# Patient Record
Sex: Male | Born: 1945 | Race: White | Hispanic: No | Smoking: Former smoker
Health system: Southern US, Community
[De-identification: ages and names within clinical notes are randomized; demographics above are authoritative.]

## PROBLEM LIST (undated history)

## (undated) DIAGNOSIS — E119 Type 2 diabetes mellitus without complications: Secondary | ICD-10-CM

## (undated) DIAGNOSIS — M19019 Primary osteoarthritis, unspecified shoulder: Secondary | ICD-10-CM

## (undated) DIAGNOSIS — K219 Gastro-esophageal reflux disease without esophagitis: Secondary | ICD-10-CM

## (undated) DIAGNOSIS — F32A Depression, unspecified: Secondary | ICD-10-CM

## (undated) DIAGNOSIS — E785 Hyperlipidemia, unspecified: Secondary | ICD-10-CM

## (undated) DIAGNOSIS — E559 Vitamin D deficiency, unspecified: Secondary | ICD-10-CM

## (undated) DIAGNOSIS — I10 Essential (primary) hypertension: Secondary | ICD-10-CM

## (undated) DIAGNOSIS — R251 Tremor, unspecified: Secondary | ICD-10-CM

## (undated) DIAGNOSIS — Z95828 Presence of other vascular implants and grafts: Secondary | ICD-10-CM

## (undated) DIAGNOSIS — G43909 Migraine, unspecified, not intractable, without status migrainosus: Secondary | ICD-10-CM

## (undated) DIAGNOSIS — I2699 Other pulmonary embolism without acute cor pulmonale: Secondary | ICD-10-CM

## (undated) DIAGNOSIS — F329 Major depressive disorder, single episode, unspecified: Secondary | ICD-10-CM

## (undated) DIAGNOSIS — F419 Anxiety disorder, unspecified: Secondary | ICD-10-CM

## (undated) DIAGNOSIS — C801 Malignant (primary) neoplasm, unspecified: Secondary | ICD-10-CM

## (undated) DIAGNOSIS — K259 Gastric ulcer, unspecified as acute or chronic, without hemorrhage or perforation: Secondary | ICD-10-CM

## (undated) DIAGNOSIS — C8590 Non-Hodgkin lymphoma, unspecified, unspecified site: Secondary | ICD-10-CM

## (undated) HISTORY — DX: Primary osteoarthritis, unspecified shoulder: M19.019

## (undated) HISTORY — DX: Other pulmonary embolism without acute cor pulmonale: I26.99

## (undated) HISTORY — DX: Gastric ulcer, unspecified as acute or chronic, without hemorrhage or perforation: K25.9

## (undated) HISTORY — DX: Depression, unspecified: F32.A

## (undated) HISTORY — DX: Gastro-esophageal reflux disease without esophagitis: K21.9

## (undated) HISTORY — DX: Non-Hodgkin lymphoma, unspecified, unspecified site: C85.90

## (undated) HISTORY — PX: KNEE SURGERY: SHX244

## (undated) HISTORY — DX: Migraine, unspecified, not intractable, without status migrainosus: G43.909

## (undated) HISTORY — DX: Type 2 diabetes mellitus without complications: E11.9

## (undated) HISTORY — DX: Major depressive disorder, single episode, unspecified: F32.9

## (undated) HISTORY — DX: Anxiety disorder, unspecified: F41.9

## (undated) HISTORY — DX: Tremor, unspecified: R25.1

## (undated) HISTORY — DX: Vitamin D deficiency, unspecified: E55.9

## (undated) HISTORY — DX: Hyperlipidemia, unspecified: E78.5

## (undated) HISTORY — PX: SHOULDER SURGERY: SHX246

## (undated) HISTORY — PX: BACK SURGERY: SHX140

---
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
## 2003-11-27 ENCOUNTER — Encounter: Admission: RE | Admit: 2003-11-27 | Discharge: 2003-11-27 | Payer: Self-pay

## 2004-02-17 ENCOUNTER — Ambulatory Visit (HOSPITAL_COMMUNITY): Admission: RE | Admit: 2004-02-17 | Discharge: 2004-02-17 | Payer: Self-pay | Admitting: Neurology

## 2004-02-22 ENCOUNTER — Encounter: Admission: RE | Admit: 2004-02-22 | Discharge: 2004-05-22 | Payer: Self-pay | Admitting: Neurology

## 2005-01-04 ENCOUNTER — Emergency Department (HOSPITAL_COMMUNITY): Admission: EM | Admit: 2005-01-04 | Discharge: 2005-01-04 | Payer: Self-pay | Admitting: *Deleted

## 2005-01-06 ENCOUNTER — Emergency Department (HOSPITAL_COMMUNITY): Admission: EM | Admit: 2005-01-06 | Discharge: 2005-01-06 | Payer: Self-pay | Admitting: Emergency Medicine

## 2005-01-16 ENCOUNTER — Emergency Department (HOSPITAL_COMMUNITY): Admission: EM | Admit: 2005-01-16 | Discharge: 2005-01-16 | Payer: Self-pay | Admitting: Emergency Medicine

## 2005-01-18 ENCOUNTER — Emergency Department (HOSPITAL_COMMUNITY): Admission: EM | Admit: 2005-01-18 | Discharge: 2005-01-18 | Payer: Self-pay | Admitting: Emergency Medicine

## 2005-02-14 ENCOUNTER — Emergency Department (HOSPITAL_COMMUNITY): Admission: EM | Admit: 2005-02-14 | Discharge: 2005-02-14 | Payer: Self-pay | Admitting: Emergency Medicine

## 2005-02-16 ENCOUNTER — Emergency Department (HOSPITAL_COMMUNITY): Admission: EM | Admit: 2005-02-16 | Discharge: 2005-02-16 | Payer: Self-pay | Admitting: Emergency Medicine

## 2005-02-18 ENCOUNTER — Emergency Department (HOSPITAL_COMMUNITY): Admission: EM | Admit: 2005-02-18 | Discharge: 2005-02-18 | Payer: Self-pay | Admitting: Emergency Medicine

## 2006-04-23 ENCOUNTER — Observation Stay (HOSPITAL_COMMUNITY): Admission: RE | Admit: 2006-04-23 | Discharge: 2006-04-24 | Payer: Self-pay | Admitting: Neurosurgery

## 2007-02-22 ENCOUNTER — Inpatient Hospital Stay (HOSPITAL_COMMUNITY): Admission: EM | Admit: 2007-02-22 | Discharge: 2007-02-26 | Payer: Self-pay | Admitting: Emergency Medicine

## 2007-02-27 ENCOUNTER — Ambulatory Visit (HOSPITAL_COMMUNITY): Payer: Self-pay | Admitting: Family Medicine

## 2007-02-27 ENCOUNTER — Encounter (HOSPITAL_COMMUNITY): Admission: RE | Admit: 2007-02-27 | Discharge: 2007-03-29 | Payer: Self-pay | Admitting: Family Medicine

## 2007-02-27 ENCOUNTER — Encounter (HOSPITAL_COMMUNITY): Admission: RE | Admit: 2007-02-27 | Discharge: 2007-03-29 | Payer: Self-pay

## 2009-02-23 ENCOUNTER — Emergency Department (HOSPITAL_COMMUNITY): Admission: EM | Admit: 2009-02-23 | Discharge: 2009-02-23 | Payer: Self-pay | Admitting: Emergency Medicine

## 2009-09-20 ENCOUNTER — Ambulatory Visit: Payer: Self-pay | Admitting: Cardiology

## 2009-09-20 ENCOUNTER — Observation Stay (HOSPITAL_COMMUNITY): Admission: EM | Admit: 2009-09-20 | Discharge: 2009-09-23 | Payer: Self-pay | Admitting: Emergency Medicine

## 2009-09-21 ENCOUNTER — Encounter (INDEPENDENT_AMBULATORY_CARE_PROVIDER_SITE_OTHER): Payer: Self-pay | Admitting: Internal Medicine

## 2010-06-25 ENCOUNTER — Encounter: Admission: RE | Admit: 2010-06-25 | Discharge: 2010-06-25 | Payer: Self-pay | Admitting: Neurosurgery

## 2010-10-10 ENCOUNTER — Inpatient Hospital Stay (HOSPITAL_COMMUNITY)
Admission: EM | Admit: 2010-10-10 | Discharge: 2010-10-16 | Payer: Self-pay | Source: Home / Self Care | Attending: Internal Medicine | Admitting: Internal Medicine

## 2010-11-26 ENCOUNTER — Encounter: Payer: Self-pay | Admitting: Family Medicine

## 2011-01-16 LAB — GLUCOSE, CAPILLARY
Glucose-Capillary: 102 mg/dL — ABNORMAL HIGH (ref 70–99)
Glucose-Capillary: 103 mg/dL — ABNORMAL HIGH (ref 70–99)
Glucose-Capillary: 110 mg/dL — ABNORMAL HIGH (ref 70–99)
Glucose-Capillary: 113 mg/dL — ABNORMAL HIGH (ref 70–99)
Glucose-Capillary: 114 mg/dL — ABNORMAL HIGH (ref 70–99)
Glucose-Capillary: 115 mg/dL — ABNORMAL HIGH (ref 70–99)
Glucose-Capillary: 115 mg/dL — ABNORMAL HIGH (ref 70–99)
Glucose-Capillary: 115 mg/dL — ABNORMAL HIGH (ref 70–99)
Glucose-Capillary: 115 mg/dL — ABNORMAL HIGH (ref 70–99)
Glucose-Capillary: 119 mg/dL — ABNORMAL HIGH (ref 70–99)
Glucose-Capillary: 121 mg/dL — ABNORMAL HIGH (ref 70–99)
Glucose-Capillary: 121 mg/dL — ABNORMAL HIGH (ref 70–99)
Glucose-Capillary: 122 mg/dL — ABNORMAL HIGH (ref 70–99)
Glucose-Capillary: 138 mg/dL — ABNORMAL HIGH (ref 70–99)
Glucose-Capillary: 139 mg/dL — ABNORMAL HIGH (ref 70–99)
Glucose-Capillary: 149 mg/dL — ABNORMAL HIGH (ref 70–99)
Glucose-Capillary: 159 mg/dL — ABNORMAL HIGH (ref 70–99)
Glucose-Capillary: 161 mg/dL — ABNORMAL HIGH (ref 70–99)
Glucose-Capillary: 89 mg/dL (ref 70–99)
Glucose-Capillary: 92 mg/dL (ref 70–99)
Glucose-Capillary: 93 mg/dL (ref 70–99)
Glucose-Capillary: 96 mg/dL (ref 70–99)
Glucose-Capillary: 98 mg/dL (ref 70–99)

## 2011-01-16 LAB — CBC
HCT: 38.3 % — ABNORMAL LOW (ref 39.0–52.0)
HCT: 39.7 % (ref 39.0–52.0)
HCT: 39.8 % (ref 39.0–52.0)
HCT: 39.9 % (ref 39.0–52.0)
HCT: 40 % (ref 39.0–52.0)
HCT: 41.6 % (ref 39.0–52.0)
Hemoglobin: 12.8 g/dL — ABNORMAL LOW (ref 13.0–17.0)
Hemoglobin: 12.9 g/dL — ABNORMAL LOW (ref 13.0–17.0)
Hemoglobin: 13.1 g/dL (ref 13.0–17.0)
Hemoglobin: 13.2 g/dL (ref 13.0–17.0)
Hemoglobin: 13.4 g/dL (ref 13.0–17.0)
Hemoglobin: 13.8 g/dL (ref 13.0–17.0)
MCH: 30.9 pg (ref 26.0–34.0)
MCH: 31 pg (ref 26.0–34.0)
MCH: 31.2 pg (ref 26.0–34.0)
MCH: 31.4 pg (ref 26.0–34.0)
MCH: 31.7 pg (ref 26.0–34.0)
MCH: 31.8 pg (ref 26.0–34.0)
MCHC: 32.4 g/dL (ref 30.0–36.0)
MCHC: 33 g/dL (ref 30.0–36.0)
MCHC: 33.1 g/dL (ref 30.0–36.0)
MCHC: 33.2 g/dL (ref 30.0–36.0)
MCHC: 33.4 g/dL (ref 30.0–36.0)
MCHC: 33.5 g/dL (ref 30.0–36.0)
MCV: 93.7 fL (ref 78.0–100.0)
MCV: 93.9 fL (ref 78.0–100.0)
MCV: 93.9 fL (ref 78.0–100.0)
MCV: 95 fL (ref 78.0–100.0)
MCV: 95.4 fL (ref 78.0–100.0)
MCV: 95.7 fL (ref 78.0–100.0)
Platelets: 239 10*3/uL (ref 150–400)
Platelets: 252 10*3/uL (ref 150–400)
Platelets: 254 10*3/uL (ref 150–400)
Platelets: 267 10*3/uL (ref 150–400)
Platelets: 270 10*3/uL (ref 150–400)
Platelets: 271 10*3/uL (ref 150–400)
RBC: 4.03 MIL/uL — ABNORMAL LOW (ref 4.22–5.81)
RBC: 4.17 MIL/uL — ABNORMAL LOW (ref 4.22–5.81)
RBC: 4.17 MIL/uL — ABNORMAL LOW (ref 4.22–5.81)
RBC: 4.23 MIL/uL (ref 4.22–5.81)
RBC: 4.27 MIL/uL (ref 4.22–5.81)
RBC: 4.43 MIL/uL (ref 4.22–5.81)
RDW: 12.1 % (ref 11.5–15.5)
RDW: 12.2 % (ref 11.5–15.5)
RDW: 12.2 % (ref 11.5–15.5)
RDW: 12.2 % (ref 11.5–15.5)
RDW: 12.3 % (ref 11.5–15.5)
RDW: 12.3 % (ref 11.5–15.5)
WBC: 5.8 10*3/uL (ref 4.0–10.5)
WBC: 6.7 10*3/uL (ref 4.0–10.5)
WBC: 6.9 10*3/uL (ref 4.0–10.5)
WBC: 7.8 10*3/uL (ref 4.0–10.5)
WBC: 8.5 10*3/uL (ref 4.0–10.5)
WBC: 9.3 10*3/uL (ref 4.0–10.5)

## 2011-01-16 LAB — URINALYSIS, ROUTINE W REFLEX MICROSCOPIC
Bilirubin Urine: NEGATIVE
Glucose, UA: NEGATIVE mg/dL
Hgb urine dipstick: NEGATIVE
Ketones, ur: NEGATIVE mg/dL
Nitrite: NEGATIVE
Protein, ur: NEGATIVE mg/dL
Specific Gravity, Urine: 1.009 (ref 1.005–1.030)
Urobilinogen, UA: 0.2 mg/dL (ref 0.0–1.0)
pH: 7 (ref 5.0–8.0)

## 2011-01-16 LAB — URINE CULTURE
Colony Count: NO GROWTH
Colony Count: NO GROWTH
Culture  Setup Time: 201112071248
Culture  Setup Time: 201112102039
Culture: NO GROWTH
Culture: NO GROWTH

## 2011-01-16 LAB — HEMOGLOBIN A1C
Hgb A1c MFr Bld: 6 % — ABNORMAL HIGH (ref <5.7)
Mean Plasma Glucose: 126 mg/dL — ABNORMAL HIGH (ref <117)

## 2011-01-16 LAB — BASIC METABOLIC PANEL
BUN: 10 mg/dL (ref 6–23)
BUN: 11 mg/dL (ref 6–23)
BUN: 5 mg/dL — ABNORMAL LOW (ref 6–23)
BUN: 6 mg/dL (ref 6–23)
BUN: 7 mg/dL (ref 6–23)
BUN: 7 mg/dL (ref 6–23)
CO2: 24 mEq/L (ref 19–32)
CO2: 24 mEq/L (ref 19–32)
CO2: 26 mEq/L (ref 19–32)
CO2: 26 mEq/L (ref 19–32)
CO2: 27 mEq/L (ref 19–32)
CO2: 27 mEq/L (ref 19–32)
Calcium: 8.8 mg/dL (ref 8.4–10.5)
Calcium: 8.8 mg/dL (ref 8.4–10.5)
Calcium: 8.9 mg/dL (ref 8.4–10.5)
Calcium: 8.9 mg/dL (ref 8.4–10.5)
Calcium: 8.9 mg/dL (ref 8.4–10.5)
Calcium: 8.9 mg/dL (ref 8.4–10.5)
Chloride: 106 mEq/L (ref 96–112)
Chloride: 107 mEq/L (ref 96–112)
Chloride: 107 mEq/L (ref 96–112)
Chloride: 107 mEq/L (ref 96–112)
Chloride: 108 mEq/L (ref 96–112)
Chloride: 108 mEq/L (ref 96–112)
Creatinine, Ser: 0.79 mg/dL (ref 0.4–1.5)
Creatinine, Ser: 0.79 mg/dL (ref 0.4–1.5)
Creatinine, Ser: 0.79 mg/dL (ref 0.4–1.5)
Creatinine, Ser: 0.93 mg/dL (ref 0.4–1.5)
Creatinine, Ser: 0.96 mg/dL (ref 0.4–1.5)
Creatinine, Ser: 1 mg/dL (ref 0.4–1.5)
GFR calc Af Amer: >60 mL/min (ref >60)
GFR calc Af Amer: >60 mL/min (ref >60)
GFR calc Af Amer: >60 mL/min (ref >60)
GFR calc Af Amer: >60 mL/min (ref >60)
GFR calc Af Amer: >60 mL/min (ref >60)
GFR calc Af Amer: >60 mL/min (ref >60)
GFR calc non Af Amer: >60 mL/min (ref >60)
GFR calc non Af Amer: >60 mL/min (ref >60)
GFR calc non Af Amer: >60 mL/min (ref >60)
GFR calc non Af Amer: >60 mL/min (ref >60)
GFR calc non Af Amer: >60 mL/min (ref >60)
GFR calc non Af Amer: >60 mL/min (ref >60)
Glucose, Bld: 108 mg/dL — ABNORMAL HIGH (ref 70–99)
Glucose, Bld: 110 mg/dL — ABNORMAL HIGH (ref 70–99)
Glucose, Bld: 112 mg/dL — ABNORMAL HIGH (ref 70–99)
Glucose, Bld: 133 mg/dL — ABNORMAL HIGH (ref 70–99)
Glucose, Bld: 90 mg/dL (ref 70–99)
Glucose, Bld: 96 mg/dL (ref 70–99)
Potassium: 3.4 mEq/L — ABNORMAL LOW (ref 3.5–5.1)
Potassium: 3.7 mEq/L (ref 3.5–5.1)
Potassium: 3.7 mEq/L (ref 3.5–5.1)
Potassium: 3.8 mEq/L (ref 3.5–5.1)
Potassium: 3.8 mEq/L (ref 3.5–5.1)
Potassium: 3.9 mEq/L (ref 3.5–5.1)
Sodium: 139 mEq/L (ref 135–145)
Sodium: 140 mEq/L (ref 135–145)
Sodium: 140 mEq/L (ref 135–145)
Sodium: 141 mEq/L (ref 135–145)
Sodium: 141 mEq/L (ref 135–145)
Sodium: 142 mEq/L (ref 135–145)

## 2011-01-16 LAB — URINALYSIS, MICROSCOPIC ONLY
Bilirubin Urine: NEGATIVE
Glucose, UA: NEGATIVE mg/dL
Hgb urine dipstick: NEGATIVE
Ketones, ur: NEGATIVE mg/dL
Leukocytes, UA: NEGATIVE
Nitrite: NEGATIVE
Protein, ur: NEGATIVE mg/dL
Specific Gravity, Urine: 1.021 (ref 1.005–1.030)
Urobilinogen, UA: 0.2 mg/dL (ref 0.0–1.0)
pH: 6.5 (ref 5.0–8.0)

## 2011-01-16 LAB — HEMOCCULT GUIAC POC 1CARD (OFFICE): Fecal Occult Bld: POSITIVE

## 2011-01-16 LAB — DIFFERENTIAL
Basophils Absolute: 0.1 10*3/uL (ref 0.0–0.1)
Basophils Relative: 1 % (ref 0–1)
Eosinophils Absolute: 0.2 10*3/uL (ref 0.0–0.7)
Eosinophils Relative: 3 % (ref 0–5)
Lymphocytes Relative: 41 % (ref 12–46)
Lymphs Abs: 2.7 10*3/uL (ref 0.7–4.0)
Monocytes Absolute: 0.8 10*3/uL (ref 0.1–1.0)
Monocytes Relative: 11 % (ref 3–12)
Neutro Abs: 3 10*3/uL (ref 1.7–7.7)
Neutrophils Relative %: 45 % (ref 43–77)

## 2011-01-16 LAB — URINE MICROSCOPIC-ADD ON

## 2011-01-16 LAB — TSH: TSH: 3.137 u[IU]/mL (ref 0.350–4.500)

## 2011-01-16 LAB — PROTIME-INR
INR: 1.07 (ref 0.00–1.49)
Prothrombin Time: 14.1 seconds (ref 11.6–15.2)

## 2011-01-16 LAB — COMPREHENSIVE METABOLIC PANEL
ALT: 16 U/L (ref 0–53)
AST: 15 U/L (ref 0–37)
Albumin: 3.6 g/dL (ref 3.5–5.2)
Alkaline Phosphatase: 78 U/L (ref 39–117)
BUN: 7 mg/dL (ref 6–23)
CO2: 27 mEq/L (ref 19–32)
Calcium: 8.5 mg/dL (ref 8.4–10.5)
Chloride: 103 mEq/L (ref 96–112)
Creatinine, Ser: 0.79 mg/dL (ref 0.4–1.5)
GFR calc Af Amer: >60 mL/min (ref >60)
GFR calc non Af Amer: >60 mL/min (ref >60)
Glucose, Bld: 75 mg/dL (ref 70–99)
Potassium: 4 mEq/L (ref 3.5–5.1)
Sodium: 137 mEq/L (ref 135–145)
Total Bilirubin: 0.2 mg/dL — ABNORMAL LOW (ref 0.3–1.2)
Total Protein: 6.5 g/dL (ref 6.0–8.3)

## 2011-01-16 LAB — LACTIC ACID, PLASMA: Lactic Acid, Venous: 0.8 mmol/L (ref 0.5–2.2)

## 2011-01-16 LAB — MRSA PCR SCREENING: MRSA by PCR: NEGATIVE

## 2011-02-08 LAB — HEPATIC FUNCTION PANEL
ALT: 21 U/L (ref 0–53)
AST: 19 U/L (ref 0–37)
Albumin: 3.6 g/dL (ref 3.5–5.2)
Alkaline Phosphatase: 73 U/L (ref 39–117)
Bilirubin, Direct: <0.1 mg/dL (ref 0.0–0.3)
Total Bilirubin: 0.3 mg/dL (ref 0.3–1.2)
Total Protein: 6.7 g/dL (ref 6.0–8.3)

## 2011-02-08 LAB — BASIC METABOLIC PANEL
BUN: 9 mg/dL (ref 6–23)
BUN: 12 mg/dL (ref 6–23)
CO2: 27 mEq/L (ref 19–32)
CO2: 27 mEq/L (ref 19–32)
Calcium: 8.9 mg/dL (ref 8.4–10.5)
Calcium: 9.1 mg/dL (ref 8.4–10.5)
Chloride: 106 mEq/L (ref 96–112)
Chloride: 105 mEq/L (ref 96–112)
Creatinine, Ser: 0.95 mg/dL (ref 0.4–1.5)
Creatinine, Ser: 0.95 mg/dL (ref 0.4–1.5)
GFR calc Af Amer: >60 mL/min (ref >60)
GFR calc Af Amer: >60 mL/min (ref >60)
GFR calc non Af Amer: >60 mL/min (ref >60)
GFR calc non Af Amer: >60 mL/min (ref >60)
Glucose, Bld: 109 mg/dL — ABNORMAL HIGH (ref 70–99)
Glucose, Bld: 63 mg/dL — ABNORMAL LOW (ref 70–99)
Potassium: 4.4 mEq/L (ref 3.5–5.1)
Potassium: 3.6 mEq/L (ref 3.5–5.1)
Sodium: 139 mEq/L (ref 135–145)
Sodium: 140 mEq/L (ref 135–145)

## 2011-02-08 LAB — DIFFERENTIAL
Basophils Absolute: 0 10*3/uL (ref 0.0–0.1)
Basophils Absolute: 0 10*3/uL (ref 0.0–0.1)
Basophils Absolute: 0 10*3/uL (ref 0.0–0.1)
Basophils Relative: 1 % (ref 0–1)
Basophils Relative: 1 % (ref 0–1)
Basophils Relative: 1 % (ref 0–1)
Eosinophils Absolute: 0.1 10*3/uL (ref 0.0–0.7)
Eosinophils Absolute: 0.2 10*3/uL (ref 0.0–0.7)
Eosinophils Absolute: 0.1 10*3/uL (ref 0.0–0.7)
Eosinophils Relative: 1 % (ref 0–5)
Eosinophils Relative: 4 % (ref 0–5)
Eosinophils Relative: 2 % (ref 0–5)
Lymphocytes Relative: 32 % (ref 12–46)
Lymphocytes Relative: 49 % — ABNORMAL HIGH (ref 12–46)
Lymphocytes Relative: 40 % (ref 12–46)
Lymphs Abs: 2.9 10*3/uL (ref 0.7–4.0)
Lymphs Abs: 3.4 10*3/uL (ref 0.7–4.0)
Lymphs Abs: 3.3 10*3/uL (ref 0.7–4.0)
Monocytes Absolute: 0.6 10*3/uL (ref 0.1–1.0)
Monocytes Absolute: 0.8 10*3/uL (ref 0.1–1.0)
Monocytes Absolute: 0.8 10*3/uL (ref 0.1–1.0)
Monocytes Relative: 9 % (ref 3–12)
Monocytes Relative: 9 % (ref 3–12)
Monocytes Relative: 10 % (ref 3–12)
Neutro Abs: 2.7 10*3/uL (ref 1.7–7.7)
Neutro Abs: 5.2 10*3/uL (ref 1.7–7.7)
Neutro Abs: 4 10*3/uL (ref 1.7–7.7)
Neutrophils Relative %: 39 % — ABNORMAL LOW (ref 43–77)
Neutrophils Relative %: 58 % (ref 43–77)
Neutrophils Relative %: 48 % (ref 43–77)

## 2011-02-08 LAB — GLUCOSE, CAPILLARY
Glucose-Capillary: 102 mg/dL — ABNORMAL HIGH (ref 70–99)
Glucose-Capillary: 105 mg/dL — ABNORMAL HIGH (ref 70–99)
Glucose-Capillary: 114 mg/dL — ABNORMAL HIGH (ref 70–99)
Glucose-Capillary: 130 mg/dL — ABNORMAL HIGH (ref 70–99)
Glucose-Capillary: 134 mg/dL — ABNORMAL HIGH (ref 70–99)
Glucose-Capillary: 139 mg/dL — ABNORMAL HIGH (ref 70–99)
Glucose-Capillary: 150 mg/dL — ABNORMAL HIGH (ref 70–99)
Glucose-Capillary: 154 mg/dL — ABNORMAL HIGH (ref 70–99)
Glucose-Capillary: 96 mg/dL (ref 70–99)
Glucose-Capillary: 161 mg/dL — ABNORMAL HIGH (ref 70–99)
Glucose-Capillary: 171 mg/dL — ABNORMAL HIGH (ref 70–99)

## 2011-02-08 LAB — COMPREHENSIVE METABOLIC PANEL
ALT: 18 U/L (ref 0–53)
AST: 15 U/L (ref 0–37)
Albumin: 3.2 g/dL — ABNORMAL LOW (ref 3.5–5.2)
Alkaline Phosphatase: 62 U/L (ref 39–117)
BUN: 11 mg/dL (ref 6–23)
CO2: 26 mEq/L (ref 19–32)
Calcium: 8.9 mg/dL (ref 8.4–10.5)
Chloride: 108 mEq/L (ref 96–112)
Creatinine, Ser: 0.96 mg/dL (ref 0.4–1.5)
GFR calc Af Amer: >60 mL/min (ref >60)
GFR calc non Af Amer: >60 mL/min (ref >60)
Glucose, Bld: 107 mg/dL — ABNORMAL HIGH (ref 70–99)
Potassium: 3.9 mEq/L (ref 3.5–5.1)
Sodium: 138 mEq/L (ref 135–145)
Total Bilirubin: 0.4 mg/dL (ref 0.3–1.2)
Total Protein: 5.7 g/dL — ABNORMAL LOW (ref 6.0–8.3)

## 2011-02-08 LAB — CARDIAC PANEL(CRET KIN+CKTOT+MB+TROPI)
CK, MB: 0.7 ng/mL (ref 0.3–4.0)
CK, MB: 0.9 ng/mL (ref 0.3–4.0)
CK, MB: 0.7 ng/mL (ref 0.3–4.0)
Relative Index: INVALID (ref 0.0–2.5)
Relative Index: INVALID (ref 0.0–2.5)
Relative Index: INVALID (ref 0.0–2.5)
Total CK: 23 U/L (ref 7–232)
Total CK: 27 U/L (ref 7–232)
Total CK: 28 U/L (ref 7–232)
Troponin I: 0.02 ng/mL (ref 0.00–0.06)
Troponin I: 0.03 ng/mL (ref 0.00–0.06)
Troponin I: 0.02 ng/mL (ref 0.00–0.06)

## 2011-02-08 LAB — AMYLASE: Amylase: 64 U/L (ref 27–131)

## 2011-02-08 LAB — CBC
HCT: 35 % — ABNORMAL LOW (ref 39.0–52.0)
HCT: 35.9 % — ABNORMAL LOW (ref 39.0–52.0)
HCT: 39.7 % (ref 39.0–52.0)
Hemoglobin: 11.9 g/dL — ABNORMAL LOW (ref 13.0–17.0)
Hemoglobin: 12.4 g/dL — ABNORMAL LOW (ref 13.0–17.0)
Hemoglobin: 13.7 g/dL (ref 13.0–17.0)
MCHC: 34 g/dL (ref 30.0–36.0)
MCHC: 34.5 g/dL (ref 30.0–36.0)
MCHC: 34.4 g/dL (ref 30.0–36.0)
MCV: 94.7 fL (ref 78.0–100.0)
MCV: 95.3 fL (ref 78.0–100.0)
MCV: 95.1 fL (ref 78.0–100.0)
Platelets: 231 10*3/uL (ref 150–400)
Platelets: 245 10*3/uL (ref 150–400)
Platelets: 260 10*3/uL (ref 150–400)
RBC: 3.68 MIL/uL — ABNORMAL LOW (ref 4.22–5.81)
RBC: 3.79 MIL/uL — ABNORMAL LOW (ref 4.22–5.81)
RBC: 4.17 MIL/uL — ABNORMAL LOW (ref 4.22–5.81)
RDW: 12.6 % (ref 11.5–15.5)
RDW: 12.8 % (ref 11.5–15.5)
RDW: 13.4 % (ref 11.5–15.5)
WBC: 7 10*3/uL (ref 4.0–10.5)
WBC: 9.1 10*3/uL (ref 4.0–10.5)
WBC: 8.3 10*3/uL (ref 4.0–10.5)

## 2011-02-08 LAB — POCT CARDIAC MARKERS
CKMB, poc: <1 ng/mL — ABNORMAL LOW (ref 1.0–8.0)
Myoglobin, poc: 55.5 ng/mL (ref 12–200)
Troponin i, poc: <0.05 ng/mL (ref 0.00–0.09)

## 2011-02-08 LAB — HEMOGLOBIN A1C
Hgb A1c MFr Bld: 6.4 % — ABNORMAL HIGH (ref 4.6–6.1)
Mean Plasma Glucose: 137 mg/dL

## 2011-02-08 LAB — LIPASE, BLOOD: Lipase: 20 U/L (ref 11–59)

## 2011-02-08 LAB — VITAMIN B12 BINDING CAPACITY, BLOOD: Vitamin B12 Bind Capacity: 1269 pg/mL (ref 650–1340)

## 2011-02-08 LAB — ETHOSUXIMIDE LEVEL: Ethosuximide Lvl: <10 ug/mL (ref 40–100)

## 2011-02-08 LAB — FOLATE RBC: RBC Folate: 560 ng/mL (ref 180–600)

## 2011-02-08 LAB — FERRITIN: Ferritin: 75 ng/mL (ref 22–322)

## 2011-02-08 LAB — TSH: TSH: 4.434 u[IU]/mL (ref 0.350–4.500)

## 2011-02-08 LAB — T4, FREE: Free T4: 0.73 ng/dL — ABNORMAL LOW (ref 0.80–1.80)

## 2011-02-08 LAB — IRON: Iron: 92 ug/dL (ref 42–135)

## 2011-02-08 LAB — D-DIMER, QUANTITATIVE (NOT AT ARMC): D-Dimer, Quant: 0.23 ug/mL-FEU (ref 0.00–0.48)

## 2011-03-24 NOTE — Consult Note (Signed)
NAME:  Daniel Richards, Daniel Richards                 ACCOUNT NO.:  [id number]   MEDICAL RECORD NO.:  [id number]          PATIENT TYPE:  INP   LOCATION:  A340                          FACILITY:  [facility]   PHYSICIAN:  J. Darreld Mclean, M.D. DATE OF BIRTH:  02/28/1946   DATE OF CONSULTATION:  DATE OF DISCHARGE:                                 CONSULTATION   REQUESTING PHYSICIAN:  Patient consultation requested by Dr. Lovell Sheehan.   Patient is a 65 year old male who hit his right hand Tuesday, 3 days  ago, and had a small superficial cut to his right index finger and hand.  On the ensuing several days, he has developed redness, pain and  tenderness and centralized __________ around the MCP joint of the right  hand, the dorsal area of the index finger.  No other injuries.  Dr.  Lovell Sheehan saw the patient, asked that I see him.  The patient has redness  to this area and a small superficial fluctuant area.  He has got good  motion to the finger, neurovascularly is intact.  Feels no fever or  chill.  The area is localized to the metacarpophalangeal joint.  There  is no streaks over the hand.  Thumb and the long finger, ring finger,  the little finger are not involved.  Grip strengths are slightly  decreased secondary to pain.  No other areas.  The patient is generally  in good health.  No x-rays have been done.  The labs are pending.  The  patient has just been admitted.   Using a small needle, I aspirated, punctured the area of small  fluctuance, got a little bit of thin liquidy fluid out, then a culture  of this.  X-ray came to get him, since I finished this, and they are  pending.   Vancomycin has been ordered.  I recommend he continue this, elevation of  his hand, warm compresses and I will follow with you.           ______________________________  J. Darreld Mclean, M.D.     JWK/MEDQ  D:  02/22/2007  T:  02/23/2007  Job:  [id number]

## 2011-03-24 NOTE — H&P (Signed)
NAME:  Daniel Richards, Daniel Richards                 ACCOUNT NO.:  [id number]   MEDICAL RECORD NO.:  [id number]          PATIENT TYPE:  INP   LOCATION:  A340                          FACILITY:  [facility]   PHYSICIAN:  Marcello Moores, MD   DATE OF BIRTH:  1946-05-23   DATE OF ADMISSION:  02/22/2007  DATE OF DISCHARGE:  LH                              HISTORY & PHYSICAL   PAST MEDICAL HISTORY:  [place].   CHIEF COMPLAINT:  Wound on the right hand and left forearm.   HISTORY OF PRESENT ILLNESS:  The patient is a 65 year old man with a  history of essential tumor, diabetes mellitus controlled without  medication, presented to ER today with wound on the right hand and left  forearm, as well.  As per the patient, he had some trauma on his right  hand, and after a few days started to have swelling and then became very  painful.  He also had some small erupted wounds on the left forearm.  He  had this swelling for the last 3 days.  He has no fever.  He has no  headaches.  He has no chills.  He has no any chest complaints.  No GI  complaints nor urinary complaints.  He denied smoking or alcohol taking.  The patient lives alone in [place], and he is working in [employer]  part-time.   REVIEW OF SYSTEMS:  A 10-point review of system is noncontributory  except as detailed in the HPI.   ALLERGIES:  No known drug allergies.   SOCIAL HISTORY:  He lives alone, unmarried and has no children and is  working part-time in [employer].  He denied smoking, alcohol taking or any  drug abuse.   FAMILY HISTORY:  Noncontributory.   PAST MEDICAL HISTORY:  1. 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  2. Diabetes mellitus controlled on diet without any medication.   HOME MEDICATIONS:  1. Primidone, unknown dose.  2. Prilosec, unknown dose as well.   PHYSICAL EXAMINATION:  GENERAL:  The patient is comfortable, lying on  the bed without any respiratory distress.  VITAL SIGNS:  Temperature 97.8, blood pressure is  153/81, pulse 72,  respiratory rate 18, saturation 93% on room air.  HEENT: She has pink conjunctivae.  Nonicteric sclera.  NECK:  Supple.  No lymphadenopathy.  CHEST:  Good air entry bilaterally.  CVS: S1-S2 regular.  No murmur.  ABDOMEN:  Obese.  No area of tenderness.  Normoactive bowel sounds.  EXTREMITIES:  He has swollen wound around 3 x 3 cm with central past  wound on the dorsal side of the base of the right index finger.  There  are small erupted, drying skin lesions also on the left forearm, 3-5 in  number.  Otherwise, there is no pedal edema and peripheral pulses are  positive.  CNS: He is alert and well oriented.   LABORATORY DATA:  White blood cell count is 10,000 and hemoglobin is  13.6 and hematocrit is 39, platelet count is 357.  On the chemistry,  sodium is 134, potassium 4, chloride 101, bicarb 25, glucose is 307,  BUN  14, creatinine 0.8.   ASSESSMENT:  1. Right hand abscess and skin infection on the left forearm without      any systemic signs, and this could be simple Streptococcus      infection, but already in the emergency room already considered      staph infection.  The patient was started on vancomycin and culture      from the wound was also taken.  So, I will continue with vancomycin      until the culture comes back and will continue antibiotics      depending on the culture results.  Will send also blood culture to      make sure there is not any bacteremia.  There is clear abscess on      the right hand, even though it also might need drainage, and so      will consult Surgery for drainage.  2. Elevated blood sugar.  The patient gave history of diabetes      mellitus which was controlled with diet and exercise only.  He has      never taken any medication for that.  Will monitor it over 24 hours      with capillary blood glucose, and if remains high, we will start      medication.  3. Essential tremor.  He is on medication, Primidone, and will       continue that as his home medication.  Otherwise, currently the      patient is stable and we will put him on DVT prophylaxis as well.      Marcello Moores, MD  Electronically Signed     MT/MEDQ  D:  02/22/2007  T:  02/22/2007  Job:  [id number]

## 2011-03-24 NOTE — Group Therapy Note (Signed)
NAME:  Daniel Richards                 ACCOUNT NO.:  [id number]   MEDICAL RECORD NO.:  [id number]          PATIENT TYPE:  INP   LOCATION:  A340                          FACILITY:  [facility]   PHYSICIAN:  Margaretmary Dys, M.D.DATE OF BIRTH:  [birth year]   DATE OF PROCEDURE:  02/23/2007  DATE OF DISCHARGE:                                 PROGRESS NOTE   SUBJECTIVE:  The patient continues to have some pain in that right index  finger.  He denies any fevers or chills.  He thinks the swelling is  about the same.  The patient was seen by Dr. Hilda Lias who recommended  continued IV antibiotics.  I reviewed his MAR.  I do not see that the  patient is receiving any antibiotic.  He has headaches and otherwise  feels comfortable.  He is able to flex the index finger.   OBJECTIVE:  GENERAL:  Conscious, alert, comfortable, not in acute  distress.  VITAL SIGNS:  Blood pressure 148/80, pulse of 71, respirations 20,  temperature 97.  Blood sugars have ranged between 196 to 325.  Oxygen  saturation was 96% on room air.  MUSCULOSKELETAL:  Right index finger shows swelling at the base of it  with induration and significant erythema.  The patient is able to flex  the finger without difficulty.   The rest of the exam was unremarkable.   LABORATORY/DIAGNOSTIC DATA:  White blood cell count was 6.4, hemoglobin  of 13, hematocrit 38, platelet count was 350 with no left shift.  Sodium  137, potassium 4.5, chloride of 102, CO2 of 30, glucose 222, BUN of 11,  creatinine was 0.84, calcium was 9.  Wound cultures have no organisms  thus far.  Blood cultures are negative.   ASSESSMENT/PLAN:  Cellulitis with abscess of the right index finger,  status post trauma.  I will restart the patient on vancomycin 1 gram IV  every 12 hours and request pharmacy to help dose. He has orthopedic  evaluation for possible incision and drainage as indicated.  We will  continue all his other home medications.  We will start him on  Vicodin  as needed for better pain control.      Margaretmary Dys, M.D.  Electronically Signed     AM/MEDQ  D:  02/23/2007  T:  02/23/2007  Job:  [id number]

## 2011-03-24 NOTE — Group Therapy Note (Signed)
NAME:  Daniel Richards, Daniel Richards                 ACCOUNT NO.:  [id number]   MEDICAL RECORD NO.:  [id number]          PATIENT TYPE:  INP   LOCATION:  A340                          FACILITY:  [facility]   PHYSICIAN:  Margaretmary Dys, M.D.DATE OF BIRTH:  09/22/1946   DATE OF PROCEDURE:  02/24/2007  DATE OF DISCHARGE:                                 PROGRESS NOTE   SUBJECTIVE:  The patient feels slightly better, feels the swelling in  the right index finger is better.  He  bumped the hand while in the  bathroom with drainage of some pus and blood yesterday.  He has no  fevers.   OBJECTIVE:  Conscious, alert, comfortable, not in acute distress.  VITAL  SIGNS:  Blood pressure 120/69, pulse of 66, respirations of 18 and  temperature was 97.6.  Blood sugars have ranged between 156-275.   EXTREMITIES:  Swelling with erythema at the base of the right index  finger was much better when compared to yesterday.   LABORATORY/DIAGNOSTIC DATA:  White blood cell count was 6.8, hemoglobin  of 13.1, hematocrit 37.9, platelet count was 346,000.  Sodium 138,  potassium 4.5, chloride of 103, CO2 31, glucose 162, BUN of 10 and  creatinine was 0.79.  Hemoglobin A1c is 8.7.  Wound cultures show  Staphylococcus aureus, awaiting sensitivity.   ASSESSMENT AND PLAN:  Daniel Richards is a 65 year old male admitted with  cellulitis and abscess of his right index finger secondary to trauma.  The patient remains on vancomycin 1 g intravenously q.12 h.; there is  suspicion for methicillin-resistant Staphylococcus aureus.  The patient  has been seen by Dr. Hilda Lias and appears to be improving and we have not  seen any indication for surgical intervention at this time.  There is no  evidence of a foreign body in that right hand.  He overall remains  hemodynamically stable.  We will continue blood sugar control.  He had  spontaneous traumatic  drainage yesterday while in the bathroom.      Margaretmary Dys, M.D.  Electronically  Signed     AM/MEDQ  D:  02/25/2007  T:  02/25/2007  Job:  [id number]

## 2011-03-24 NOTE — Op Note (Signed)
NAME:  ISAIH, BULGER NO.:  [id number]   MEDICAL RECORD NO.:  [id number]          PATIENT TYPE:  INP   LOCATION:  2854                         FACILITY:  [facility]   PHYSICIAN:  Clydene Fake, M.D.  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   PROCEDURE:  Anterior cervical decompression, diskectomy and fusion at C5-6  with LifeNet bone and Eagle anterior cervical plate.   SURGEON:  Clydene Fake, M.D.   ANESTHESIA:  General endotracheal tube anesthesia.   ESTIMATED BLOOD LOSS:  Minimal.   BLOOD GIVEN:  None.   DRAINS:  None.   COMPLICATIONS:  None.   REASON FOR PROCEDURE:  The patient is a 65 year old gentleman who has been  having neck, right arm pain and numbness since Mar 08, 2006.  He was found to  have some decreased sensation in the right C6 distribution with some  decreased reflux in the right biceps.  An MRI was done, showing a large  osteophytic process with disk herniation to the right side at 5-6, causing  C6 radiculopathy and the patient is brought in for decompression and fusion.   DESCRIPTION OF PROCEDURE:  The patient was brought into the operating room  and general anesthesia was induced.  The patient was placed in 10 pounds of  halter traction and prepped and draped in the usual sterile fashion.  The  site of incision was injected with 10 mL of lidocaine with epinephrine.  Incision was then made from the middle to the anterior border of the  sternocleidomastoid muscle on the left side and the neck incision was taken  down to the platysma and hemostasis was obtained with Bovie cauterization  and the platysma was incised with the Bovie and blunt  dissection through the  anterior cervical fascia of the anterior cervical spine.  A needle was  placed in the interspace.  X-rays were obtained, confirming our position at  the 5-6.  The disk space was incised with a 15 blade and partial diskectomy  performed with pituitary rongeurs.  The needle was removed.  The longus  colli muscle was reflected laterally and using the Bovie, a self-retaining  retractor was placed.  Distraction pins were placed at C5 and 6 and the  interspace distracted.  Anterior osteophytes were removed with Kerrison  punches and Leksell rongeur.  The diskectomy was continued with pituitary  rongeurs and curettes.  One- and 2-mm Kerrison punches were then used to  remove the posterior ligament, posterior disk and to start bilateral  foraminotomies.  A high-speed drill was used to drill off the uncovertebral  joints and to continue the foraminotomy on the right side especially and  then Kerrison punches were used to continue the dissection.  Disk herniation  was found on the right side with compression of the C6  root.  We did an  extensive foraminotomy over the 6 root, removing bony spurs and disk  herniation and thickened ligament.  When we were finished, we had good  decompression of the cervical canal.  Bilateral nerve roots were thickened  with an extensive foraminotomy on the right side.  Wound was irrigated with  intravenous antibiotic solution.  We again checked and had good  decompression of the nerve roots with good hemostasis.  We measured the  height of the disk space to be 7 mm and a 7-mm LifeNet allograft bone was  tapped into placed and countersunk a couple of millimeters.  We checked a  post radiograph and there was plenty room between bone graft and dura.  Distraction pins were removed.  Hemostasis was obtained with Gelfoam and  thrombin.  Weight was removed from the traction and the bone was in  appropriate position and in good position.  An Eagle  anterior skull plate  was placed over the anterior cervical spine and 2 screws placed in C5 and 2  in C6 and these were tightened down.  One of the screws, the bottom right in  C6, was a larger-diameter screw.  These were in good position and x-rays  were obtained, confirming our position at 5-6 with anterior plate and bone  graft all in good position.  The wound was then irrigated with antibiotic  solution as the retractors were removed and there appeared to be good  hemostasis.  The platysma was closed with 3-0 Vicryl interrupted suture, the  subcutaneous tissue closed with the same.  The skin was closed with Benzoin  and Steri-Strips.  A dry sterile dressing was applied.  The patient was  awoken from anesthesia and placed in a soft cervical collar and transferred  to the recovery room in stable condition.           ______________________________  Clydene Fake, M.D.     JRH/MEDQ  D:  04/23/2006  T:  04/24/2006  Job:  [id number]

## 2011-03-24 NOTE — Discharge Summary (Signed)
NAME:  ZAKAR, BROSCH NO.:  [id number]   MEDICAL RECORD NO.:  [id number]          PATIENT TYPE:  INP   LOCATION:  A340                          FACILITY:  [facility]   PHYSICIAN:  Dorris Singh, DO    DATE OF BIRTH:  [birth year]   DATE OF ADMISSION:  02/22/2007  DATE OF DISCHARGE:  04/22/2008LH                               DISCHARGE SUMMARY   HISTORY OF PRESENT ILLNESS:  The patient is a 65 year old, Caucasian  male who presented to the Emergency Room on April 18 complaining of  right index finger pain over the second MP joint __________  several  days.  The patient states that the area became hot, red, and painful and  also noticed some draining from the skin.  The patient stated that he  upon admission also had some flank pain, and it was decided that he  would be admitted at that point in time.   ADMITTING DIAGNOSIS:  Cellulitis of the right hand.   DISCHARGE DIAGNOSIS:  Cellulitis of the right hand.   CONSULTS:  Dr. Hilda Lias was consulted to see the patient, also Dr.  Lovell Sheehan was consulted to see the patient.   While in the hospital, the patient had an x-ray done that saw a diffuse  soft tissue.  No bony abnormalities.  Also had a chest x-ray done upon  placement of the PICC line.  While the patient was here in the hospital,  it was determined that cultures would be done from the drainage from the  current wound.  Upon that, it was found that he had MRSA.  At that point  in time, it was determined the patient should have PICC line placed for  administration of vancomycin.  The patient's white count upon admission  was 10 with hemoglobin and hematocrit 13.6 and 39.1 and platelets at  357,000.  His white count upon discharge was 7.3 with his hemoglobin 13,  hematocrit 37, and his platelet count at 358,000.  It was then  determined that the patient could be safely discharged and would be  discharged on an outpatient basis to the specialty clinic.  He would  receive IV vancomycin 2 gm IV q.24 h. for 14 days.  Also would have  PT/OT continue their wound lavage on a daily basis while receiving  antibiotic therapy.   DISPOSITION:  The patient was discharged to home __________  specialty  clinic.   MEDICATIONS:  Discharged on all home meds with special instructions for  the specialty clinic for administration of vancomycin.   INSTRUCTIONS:  The patient __________  to followup with Dr. Hilda Lias  within 1-2 days for followup and evaluation.  The patient also  instructed to keep current dressing dry and clean, and the patient to  followup as stated with Orthopedics.      Dorris Singh, DO  Electronically Signed     CB/MEDQ  D:  02/27/2007  T:  02/27/2007  Job:  [id number]

## 2011-06-08 ENCOUNTER — Emergency Department (HOSPITAL_COMMUNITY): Payer: Medicare Other

## 2011-06-08 ENCOUNTER — Inpatient Hospital Stay (HOSPITAL_COMMUNITY)
Admission: EM | Admit: 2011-06-08 | Discharge: 2011-06-11 | DRG: 312 | Disposition: A | Discharge status: Home / Self Care | Payer: Medicare Other | Attending: Internal Medicine | Admitting: Internal Medicine

## 2011-06-08 DIAGNOSIS — G252 Other specified forms of tremor: Secondary | ICD-10-CM | POA: Diagnosis present

## 2011-06-08 DIAGNOSIS — J019 Acute sinusitis, unspecified: Secondary | ICD-10-CM | POA: Diagnosis present

## 2011-06-08 DIAGNOSIS — E119 Type 2 diabetes mellitus without complications: Secondary | ICD-10-CM | POA: Diagnosis present

## 2011-06-08 DIAGNOSIS — E785 Hyperlipidemia, unspecified: Secondary | ICD-10-CM | POA: Diagnosis present

## 2011-06-08 DIAGNOSIS — J329 Chronic sinusitis, unspecified: Secondary | ICD-10-CM | POA: Diagnosis present

## 2011-06-08 DIAGNOSIS — R55 Syncope and collapse: Principal | ICD-10-CM | POA: Diagnosis present

## 2011-06-08 DIAGNOSIS — I10 Essential (primary) hypertension: Secondary | ICD-10-CM | POA: Diagnosis present

## 2011-06-08 DIAGNOSIS — D72829 Elevated white blood cell count, unspecified: Secondary | ICD-10-CM | POA: Diagnosis present

## 2011-06-08 DIAGNOSIS — G250 Essential tremor: Secondary | ICD-10-CM | POA: Diagnosis present

## 2011-06-08 DIAGNOSIS — Z7982 Long term (current) use of aspirin: Secondary | ICD-10-CM

## 2011-06-08 HISTORY — DX: Essential (primary) hypertension: I10

## 2011-06-09 ENCOUNTER — Emergency Department (HOSPITAL_COMMUNITY): Payer: Medicare Other

## 2011-06-09 ENCOUNTER — Inpatient Hospital Stay (HOSPITAL_COMMUNITY): Payer: Medicare Other

## 2011-06-09 ENCOUNTER — Encounter (HOSPITAL_COMMUNITY): Payer: Self-pay | Admitting: Radiology

## 2011-06-09 DIAGNOSIS — R55 Syncope and collapse: Secondary | ICD-10-CM

## 2011-06-09 DIAGNOSIS — I517 Cardiomegaly: Secondary | ICD-10-CM

## 2011-06-09 DIAGNOSIS — E1142 Type 2 diabetes mellitus with diabetic polyneuropathy: Secondary | ICD-10-CM | POA: Diagnosis present

## 2011-06-09 LAB — CK TOTAL AND CKMB (NOT AT ARMC)
CK, MB: 2.4 ng/mL (ref 0.3–4.0)
Relative Index: 2.1 (ref 0.0–2.5)
Total CK: 116 U/L (ref 7–232)

## 2011-06-09 LAB — CARDIAC PANEL(CRET KIN+CKTOT+MB+TROPI)
CK, MB: 2.1 ng/mL (ref 0.3–4.0)
CK, MB: 2.1 ng/mL (ref 0.3–4.0)
CK, MB: 2.1 ng/mL (ref 0.3–4.0)
CK, MB: 2.1 ng/mL (ref 0.3–4.0)
Relative Index: 2 (ref 0.0–2.5)
Relative Index: INVALID (ref 0.0–2.5)
Relative Index: INVALID (ref 0.0–2.5)
Relative Index: INVALID (ref 0.0–2.5)
Total CK: 104 U/L (ref 7–232)
Total CK: 95 U/L (ref 7–232)
Total CK: 95 U/L (ref 7–232)
Total CK: 93 U/L (ref 7–232)
Troponin I: <0.3 ng/mL (ref <0.30)
Troponin I: <0.3 ng/mL (ref <0.30)
Troponin I: <0.3 ng/mL (ref <0.30)
Troponin I: <0.3 ng/mL (ref <0.30)

## 2011-06-09 LAB — DIFFERENTIAL
Basophils Absolute: 0 10*3/uL (ref 0.0–0.1)
Basophils Absolute: 0 10*3/uL (ref 0.0–0.1)
Basophils Relative: 0 % (ref 0–1)
Basophils Relative: 0 % (ref 0–1)
Eosinophils Absolute: 0.3 10*3/uL (ref 0.0–0.7)
Eosinophils Absolute: 0.1 10*3/uL (ref 0.0–0.7)
Eosinophils Relative: 2 % (ref 0–5)
Eosinophils Relative: 1 % (ref 0–5)
Lymphocytes Relative: 32 % (ref 12–46)
Lymphocytes Relative: 26 % (ref 12–46)
Lymphs Abs: 3.6 10*3/uL (ref 0.7–4.0)
Lymphs Abs: 3.1 10*3/uL (ref 0.7–4.0)
Monocytes Absolute: 1.3 10*3/uL — ABNORMAL HIGH (ref 0.1–1.0)
Monocytes Absolute: 1.2 10*3/uL — ABNORMAL HIGH (ref 0.1–1.0)
Monocytes Relative: 12 % (ref 3–12)
Monocytes Relative: 10 % (ref 3–12)
Neutro Abs: 6 10*3/uL (ref 1.7–7.7)
Neutro Abs: 7.5 10*3/uL (ref 1.7–7.7)
Neutrophils Relative %: 54 % (ref 43–77)
Neutrophils Relative %: 62 % (ref 43–77)

## 2011-06-09 LAB — COMPREHENSIVE METABOLIC PANEL
ALT: 15 U/L (ref 0–53)
ALT: 19 U/L (ref 0–53)
AST: 15 U/L (ref 0–37)
AST: 17 U/L (ref 0–37)
Albumin: 3.3 g/dL — ABNORMAL LOW (ref 3.5–5.2)
Albumin: 3.8 g/dL (ref 3.5–5.2)
Alkaline Phosphatase: 97 U/L (ref 39–117)
Alkaline Phosphatase: 103 U/L (ref 39–117)
BUN: 11 mg/dL (ref 6–23)
BUN: 13 mg/dL (ref 6–23)
CO2: 24 mEq/L (ref 19–32)
CO2: 25 mEq/L (ref 19–32)
Calcium: 8.7 mg/dL (ref 8.4–10.5)
Calcium: 8.9 mg/dL (ref 8.4–10.5)
Chloride: 106 mEq/L (ref 96–112)
Chloride: 106 mEq/L (ref 96–112)
Creatinine, Ser: 0.59 mg/dL (ref 0.50–1.35)
Creatinine, Ser: 0.73 mg/dL (ref 0.50–1.35)
GFR calc Af Amer: >60 mL/min (ref >60)
GFR calc Af Amer: >60 mL/min (ref >60)
GFR calc non Af Amer: >60 mL/min (ref >60)
GFR calc non Af Amer: >60 mL/min (ref >60)
Glucose, Bld: 83 mg/dL (ref 70–99)
Glucose, Bld: 61 mg/dL — ABNORMAL LOW (ref 70–99)
Potassium: 3.4 mEq/L — ABNORMAL LOW (ref 3.5–5.1)
Potassium: 3.5 mEq/L (ref 3.5–5.1)
Sodium: 139 mEq/L (ref 135–145)
Sodium: 139 mEq/L (ref 135–145)
Total Bilirubin: 0.2 mg/dL — ABNORMAL LOW (ref 0.3–1.2)
Total Bilirubin: 0.1 mg/dL — ABNORMAL LOW (ref 0.3–1.2)
Total Protein: 6.2 g/dL (ref 6.0–8.3)
Total Protein: 6.7 g/dL (ref 6.0–8.3)

## 2011-06-09 LAB — MRSA PCR SCREENING: MRSA by PCR: NEGATIVE

## 2011-06-09 LAB — TROPONIN I: Troponin I: 0.31 ng/mL (ref <0.30)

## 2011-06-09 LAB — GLUCOSE, CAPILLARY
Glucose-Capillary: 106 mg/dL — ABNORMAL HIGH (ref 70–99)
Glucose-Capillary: 163 mg/dL — ABNORMAL HIGH (ref 70–99)
Glucose-Capillary: 86 mg/dL (ref 70–99)

## 2011-06-09 LAB — LIPASE, BLOOD: Lipase: 40 U/L (ref 11–59)

## 2011-06-09 LAB — CBC
HCT: 37.3 % — ABNORMAL LOW (ref 39.0–52.0)
HCT: 38.5 % — ABNORMAL LOW (ref 39.0–52.0)
Hemoglobin: 12.6 g/dL — ABNORMAL LOW (ref 13.0–17.0)
Hemoglobin: 13.2 g/dL (ref 13.0–17.0)
MCH: 31.3 pg (ref 26.0–34.0)
MCH: 31.5 pg (ref 26.0–34.0)
MCHC: 33.8 g/dL (ref 30.0–36.0)
MCHC: 34.3 g/dL (ref 30.0–36.0)
MCV: 92.6 fL (ref 78.0–100.0)
MCV: 91.9 fL (ref 78.0–100.0)
Platelets: 230 10*3/uL (ref 150–400)
Platelets: 236 10*3/uL (ref 150–400)
RBC: 4.03 MIL/uL — ABNORMAL LOW (ref 4.22–5.81)
RBC: 4.19 MIL/uL — ABNORMAL LOW (ref 4.22–5.81)
RDW: 13 % (ref 11.5–15.5)
RDW: 13 % (ref 11.5–15.5)
WBC: 11.2 10*3/uL — ABNORMAL HIGH (ref 4.0–10.5)
WBC: 12 10*3/uL — ABNORMAL HIGH (ref 4.0–10.5)

## 2011-06-09 LAB — PROTIME-INR
INR: 1.18 (ref 0.00–1.49)
Prothrombin Time: 15.2 seconds (ref 11.6–15.2)

## 2011-06-09 LAB — HEMOGLOBIN A1C
Hgb A1c MFr Bld: 5.5 % (ref <5.7)
Mean Plasma Glucose: 111 mg/dL (ref <117)

## 2011-06-09 LAB — MAGNESIUM: Magnesium: 2.1 mg/dL (ref 1.5–2.5)

## 2011-06-09 LAB — D-DIMER, QUANTITATIVE (NOT AT ARMC): D-Dimer, Quant: 0.28 ug/mL-FEU (ref 0.00–0.48)

## 2011-06-09 LAB — ETHANOL: Alcohol, Ethyl (B): <11 mg/dL (ref 0–11)

## 2011-06-09 MED ORDER — IOHEXOL 300 MG/ML  SOLN
80.0000 mL | Freq: Once | INTRAMUSCULAR | Status: AC | PRN
  Administered 2011-06-09: 80 mL via INTRAVENOUS
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
## 2011-06-10 ENCOUNTER — Inpatient Hospital Stay (HOSPITAL_COMMUNITY): Payer: Medicare Other

## 2011-06-10 LAB — BASIC METABOLIC PANEL
BUN: 10 mg/dL (ref 6–23)
CO2: 27 mEq/L (ref 19–32)
Calcium: 8.7 mg/dL (ref 8.4–10.5)
Chloride: 106 mEq/L (ref 96–112)
Creatinine, Ser: 0.57 mg/dL (ref 0.50–1.35)
GFR calc Af Amer: >60 mL/min (ref >60)
GFR calc non Af Amer: >60 mL/min (ref >60)
Glucose, Bld: 86 mg/dL (ref 70–99)
Potassium: 3.7 mEq/L (ref 3.5–5.1)
Sodium: 141 mEq/L (ref 135–145)

## 2011-06-10 LAB — CBC
HCT: 37.6 % — ABNORMAL LOW (ref 39.0–52.0)
Hemoglobin: 12.6 g/dL — ABNORMAL LOW (ref 13.0–17.0)
MCH: 31.2 pg (ref 26.0–34.0)
MCHC: 33.5 g/dL (ref 30.0–36.0)
MCV: 93.1 fL (ref 78.0–100.0)
Platelets: 223 10*3/uL (ref 150–400)
RBC: 4.04 MIL/uL — ABNORMAL LOW (ref 4.22–5.81)
RDW: 13 % (ref 11.5–15.5)
WBC: 9.9 10*3/uL (ref 4.0–10.5)

## 2011-06-10 LAB — GLUCOSE, CAPILLARY
Glucose-Capillary: 98 mg/dL (ref 70–99)
Glucose-Capillary: 120 mg/dL — ABNORMAL HIGH (ref 70–99)
Glucose-Capillary: 82 mg/dL (ref 70–99)
Glucose-Capillary: 71 mg/dL (ref 70–99)

## 2011-06-10 LAB — FOLATE: Folate: 10.8 ng/mL

## 2011-06-10 LAB — VITAMIN B12: Vitamin B-12: 233 pg/mL (ref 211–911)

## 2011-06-10 MED ORDER — GADOBENATE DIMEGLUMINE 529 MG/ML IV SOLN
20.0000 mL | Freq: Once | INTRAVENOUS | Status: AC | PRN
  Administered 2011-06-10: 20 mL via INTRAVENOUS

## 2011-06-11 LAB — GLUCOSE, CAPILLARY: Glucose-Capillary: 78 mg/dL (ref 70–99)

## 2011-06-11 NOTE — Discharge Summary (Signed)
Daniel Richards, Daniel Richards NO.:  [id number]  MEDICAL RECORD NO.:  [id number]  LOCATION:  2023                         FACILITY:  [facility]  PHYSICIAN:  Conley Canal, MD      DATE OF BIRTH:  July 29, 1946  DATE OF ADMISSION:  2011/06/10 DATE OF DISCHARGE:  06/11/2011                        DISCHARGE SUMMARY - REFERRING   PRIMARY CARE PHYSICIAN:  Dr. Charm Barges in [place].  DISCHARGE DIAGNOSES: 1. Syncopal episode, etiology unclear, likely vasovagal versus ongoing     sinus infection. 2. Diabetes mellitus type 2. 3. Hypertension. 4. Essential tremor. 5. Hyperlipidemia.  His INR 39. 6. Leukocytosis probably secondary to acute sinusitis.  DISCHARGE MEDICATIONS: 1. Moxifloxacin 400 mg daily for 6 more days. 2. Aspirin 81 mg daily. 3. Celexa 20 mg daily. 4. Colace 100 mg twice daily. 5. Glimepiride 4 mg daily. 6. Vicodin 10-325 mg every 4 hours as needed. 7. Lisinopril 20 mg daily. 8. Lovastatin 40 mg daily. 9. Metformin 500 mg twice daily. 10.Primidone 250 mg 3 times daily. 11.Topamax 200 mg daily. 12.Vitamin D2 100,000 units weekly. 13.Ex-Lax OTC 1 tablet twice daily.  PROCEDURES PERFORMED: 1. Chest x-ray on August 3 showed no active cardiopulmonary disease. 2. CT abdomen and pelvis on August 3 showed no evidence of visceral     injury, hemoperitoneum or other acute findings and a stable     enlarged prostate. 3. MRI of the brain with and without contrast showed no acute infarct     but some polypoid opacification involving the inferior aspect of     the right maxillary sinus and left sphenoid sinus. 4. 2-D echocardiogram on August 3 showed EF 55% to 60%, grade 1     diastolic dysfunction. 5. Bilateral carotid duplex on August 3 showed no significant stenosis     bilaterally.  HOSPITAL COURSE:  The patient is a pleasant 65 year old male who passed out and fell in the water while fishing.  He apparently felt like his lower extremities gave up on him and could  not walk and fell into the water.  The patient recovered fully although he complained of persistent headache.  Upon admission, investigations showed polypoid opacification of the inferior aspect of the right maxillary sinus and left sphenoid sinus.  The patient admitted to history of recurrent runny nose but denies knowledge of previous sinus disease.  Per records from 2005, the patient has had some mild right chronic maxillary sinusitis.  In the hospital, he did not have any arrhythmias on telemetry.  Cardiac enzymes were negative, so was the D-dimer.  Hemoglobin A1c 5.5.  Likely this was a vasovagal incident versus possibility of acute-on-chronic sinusitis given leukocytosis at admission versus medications effect.  Labs also included vitamin B12 which was 233.  Folic acid level 10.8. The patient was started on Avelox empirically as his white count was 12,000 on August 2 and he responded with improvement of the white count to 9000 on August 4 suggesting possibility of acute sinusitis.  This could have given rise to the headache.  He should follow up with Dr. Charm Barges in the next 1-2 weeks and I recommended referral to ENT regarding the sinus polyps.  He is discharged in  stable condition today.  The time spent for discharge preparation is less than 30 minutes.     Conley Canal, MD     SR/MEDQ  D:  06/11/2011  T:  06/11/2011  Job:  [id number]  cc:   Samuel Jester, MD  Electronically Signed by Conley Canal  on 06/11/2011 05:47:19 PM

## 2011-06-12 MED ORDER — GADOBENATE DIMEGLUMINE 529 MG/ML IV SOLN
20.0000 mL | Freq: Once | INTRAVENOUS | Status: AC
  Administered 2011-06-10: 20 mL via INTRAVENOUS

## 2011-07-08 DIAGNOSIS — 419620001 Death: Secondary | SNOMED CT | POA: Diagnosis present

## 2011-07-08 DEATH — deceased

## 2011-11-24 ENCOUNTER — Other Ambulatory Visit: Payer: Self-pay | Admitting: Neurology

## 2011-11-24 DIAGNOSIS — M79609 Pain in unspecified limb: Secondary | ICD-10-CM

## 2011-11-24 DIAGNOSIS — M47812 Spondylosis without myelopathy or radiculopathy, cervical region: Secondary | ICD-10-CM

## 2011-11-24 DIAGNOSIS — G250 Essential tremor: Secondary | ICD-10-CM

## 2011-11-24 DIAGNOSIS — R42 Dizziness and giddiness: Secondary | ICD-10-CM

## 2011-11-24 DIAGNOSIS — G252 Other specified forms of tremor: Secondary | ICD-10-CM

## 2011-11-30 ENCOUNTER — Ambulatory Visit
Admission: RE | Admit: 2011-11-30 | Discharge: 2011-11-30 | Disposition: A | Discharge status: Home / Self Care | Payer: Medicare Other | Source: Ambulatory Visit | Attending: Neurology | Admitting: Neurology

## 2011-11-30 DIAGNOSIS — M47812 Spondylosis without myelopathy or radiculopathy, cervical region: Secondary | ICD-10-CM

## 2011-11-30 DIAGNOSIS — R42 Dizziness and giddiness: Secondary | ICD-10-CM

## 2011-11-30 DIAGNOSIS — M79609 Pain in unspecified limb: Secondary | ICD-10-CM

## 2011-11-30 DIAGNOSIS — G250 Essential tremor: Secondary | ICD-10-CM

## 2014-11-18 ENCOUNTER — Emergency Department (HOSPITAL_COMMUNITY)
Admission: EM | Admit: 2014-11-18 | Discharge: 2014-11-18 | Disposition: A | Discharge status: Home / Self Care | Payer: Medicare Other | Attending: Emergency Medicine | Admitting: Emergency Medicine

## 2014-11-18 ENCOUNTER — Encounter (HOSPITAL_COMMUNITY): Payer: Self-pay | Admitting: Emergency Medicine

## 2014-11-18 DIAGNOSIS — E119 Type 2 diabetes mellitus without complications: Secondary | ICD-10-CM | POA: Insufficient documentation

## 2014-11-18 DIAGNOSIS — Z79899 Other long term (current) drug therapy: Secondary | ICD-10-CM | POA: Diagnosis not present

## 2014-11-18 DIAGNOSIS — I10 Essential (primary) hypertension: Secondary | ICD-10-CM | POA: Insufficient documentation

## 2014-11-18 DIAGNOSIS — Z7982 Long term (current) use of aspirin: Secondary | ICD-10-CM | POA: Insufficient documentation

## 2014-11-18 DIAGNOSIS — N390 Urinary tract infection, site not specified: Secondary | ICD-10-CM | POA: Insufficient documentation

## 2014-11-18 DIAGNOSIS — R350 Frequency of micturition: Secondary | ICD-10-CM | POA: Diagnosis present

## 2014-11-18 LAB — URINALYSIS, ROUTINE W REFLEX MICROSCOPIC
Glucose, UA: NEGATIVE mg/dL
Ketones, ur: NEGATIVE mg/dL
Nitrite: POSITIVE — AB
Protein, ur: 100 mg/dL — AB
Specific Gravity, Urine: >1.03 — ABNORMAL HIGH (ref 1.005–1.030)
Urobilinogen, UA: 1 mg/dL (ref 0.0–1.0)
pH: 6 (ref 5.0–8.0)

## 2014-11-18 LAB — URINE MICROSCOPIC-ADD ON

## 2014-11-18 MED ORDER — LIDOCAINE HCL (PF) 1 % IJ SOLN
INTRAMUSCULAR | Status: AC
  Administered 2014-11-18: 2.1 mL via INTRAMUSCULAR
  Filled 2014-11-18: qty 5

## 2014-11-18 MED ORDER — ACETAMINOPHEN 500 MG PO TABS
1000.0000 mg | ORAL_TABLET | Freq: Once | ORAL | Status: AC
  Administered 2014-11-18: 1000 mg via ORAL
  Filled 2014-11-18: qty 2

## 2014-11-18 MED ORDER — CEFTRIAXONE SODIUM 1 G IJ SOLR
1.0000 g | Freq: Once | INTRAMUSCULAR | Status: AC
  Administered 2014-11-18: 1 g via INTRAMUSCULAR
  Filled 2014-11-18: qty 10

## 2014-11-18 MED ORDER — ONDANSETRON 4 MG PO TBDP
4.0000 mg | ORAL_TABLET | Freq: Once | ORAL | Status: AC
  Administered 2014-11-18: 4 mg via ORAL
  Filled 2014-11-18: qty 1

## 2014-11-18 MED ORDER — CEPHALEXIN 500 MG PO CAPS: 500.0000 mg | ORAL_CAPSULE | Freq: Two times a day (BID) | ORAL | Status: DC

## 2014-11-18 MED ORDER — PHENAZOPYRIDINE HCL 200 MG PO TABS: 200.0000 mg | ORAL_TABLET | Freq: Three times a day (TID) | ORAL | Status: DC | PRN

## 2014-11-18 MED ORDER — PROBIOTIC PO CAPS: 1.0000 | ORAL_CAPSULE | Freq: Every day | ORAL | Status: DC

## 2014-11-18 NOTE — Discharge Instructions (Signed)

## 2014-11-18 NOTE — ED Notes (Signed)
IM Rocephin given. Patient on med time awaiting discharge.

## 2014-11-18 NOTE — ED Provider Notes (Signed)
This chart was scribed for Friend[place], DO by Edison Simon, ED Scribe. This patient was seen in room APA06/APA06   TIME SEEN: 1:09 PM  CHIEF COMPLAINT: urinary frequency and  HPI: Daniel Richards is a 69 y.o. male with history of hypertension, diabetes who presents to the Emergency Department complaining of sudden onset urinary frequency and dysuria with onset 2 days ago. He states he felt like he had to urinate every 5-6 minutes that night but sometimes did not have urine when he went to the bathroom. He states frequency decreased today. He states he also has abdominal pain even without urinating, though is worse when he is. He reports associated chills, nausea, back pain. He denies prior kidney stones or UTI. He denies vomiting, diarrhea, penile discharge, or testicular swelling or pain.  ROS: See HPI Constitutional: no fever  Eyes: no drainage  ENT: no runny nose   Cardiovascular:  no chest pain  Resp: no SOB  GI: no vomiting, diarrhea, positive nausea, abdominal pain GU: no penile discharge, testicular swelling, testicular pain, positive frequency, dysuria, urgency, decreased volume of urination Integumentary: no rash  Allergy: no hives  Musculoskeletal: no leg swelling, positive back pain Neurological: no slurred speech, positive tremors ROS otherwise negative  PAST MEDICAL HISTORY/PAST SURGICAL HISTORY:  Past Medical History  Diagnosis Date  . Hypertension   . Diabetes mellitus 06/09/2011    pt taking metformin    MEDICATIONS:  Prior to Admission medications   Medication Sig Start Date End Date Taking? Authorizing Provider  aspirin EC 325 MG tablet Take 325 mg by mouth every evening.   Yes Historical Provider, MD  citalopram (CELEXA) 20 MG tablet Take 20 mg by mouth at bedtime.   Yes Historical Provider, MD  HYDROcodone-acetaminophen (NORCO) 10-325 MG per tablet Take 1 tablet by mouth every 6 (six) hours as needed for moderate pain.   Yes Historical Provider, MD  lisinopril  (PRINIVIL,ZESTRIL) 20 MG tablet Take 20 mg by mouth daily.   Yes Historical Provider, MD  lovastatin (MEVACOR) 20 MG tablet Take 20 mg by mouth daily.   Yes Historical Provider, MD  primidone (MYSOLINE) 50 MG tablet Take 150 mg by mouth 3 (three) times daily.    Yes Historical Provider, MD  propranolol (INDERAL) 40 MG tablet Take 40 mg by mouth 2 (two) times daily.   Yes Historical Provider, MD  topiramate (TOPAMAX) 100 MG tablet Take 200 mg by mouth at bedtime.   Yes Historical Provider, MD  Vitamin D, Ergocalciferol, (DRISDOL) 50000 UNITS CAPS capsule Take 50,000 Units by mouth every 7 (seven) days.   Yes Historical Provider, MD    ALLERGIES:  No Known Allergies  SOCIAL HISTORY:  History  Substance Use Topics  . Smoking status: Never Smoker   . Smokeless tobacco: Not on file  . Alcohol Use: No    FAMILY HISTORY: No family history on file.  EXAM: BP 109/58 mmHg  Pulse 84  Temp(Src) 98.1 F (36.7 C) (Oral)  Resp 18  Ht 5' 11.5" (1.816 m)  Wt 200 lb (90.719 kg)  BMI 27.51 kg/m2  SpO2 95% CONSTITUTIONAL: Alert and oriented and responds appropriately to questions. Well-appearing; well-nourished HEAD: Normocephalic EYES: Conjunctivae clear, PERRL ENT: normal nose; no rhinorrhea; moist mucous membranes; pharynx without lesions noted NECK: Supple, no meningismus, no LAD  CARD: RRR; S1 and S2 appreciated; no murmurs, no clicks, no rubs, no gallops RESP: Normal chest excursion without splinting or tachypnea; breath sounds clear and equal bilaterally; no wheezes,  no rhonchi, no rales,  ABD/GI: Normal bowel sounds; non-distended; soft, no rebound, no guarding; Suprapubic tenderness GU: normal external genitalia, circumcised, no penile discharge, no testicular pain or swelling, no scrotal masses, no hernia, 2+ femoral pulses bilaterally BACK:  The back appears normal and is non-tender to palpation, there is no CVA tenderness EXT: Normal ROM in all joints; non-tender to palpation; no  edema; normal capillary refill; no cyanosis    SKIN: Normal color for age and race; warm NEURO: Moves all extremities equally PSYCH: The patient's mood and manner are appropriate. Grooming and personal hygiene are appropriate.    MEDICAL DECISION MAKING: Patient here with symptoms similar to her UTI. He is well-appearing, nontoxic appearing. Hemodynamically stable. Urinalysis, urine culture pending. GU exam normal. Abdominal exam benign. No flank tenderness on my exam.  ED PROGRESS: Patient here with nitrite positive urinary tract infection. Culture pending. Will give dose of ceftriaxone IM in the emergency department and discharged on Keflex and with prescription for Pyridium. Have recommended using over-the-counter Tylenol for pain. He agrees with this plan and Tylenol has controlled his pain well in the emergency department today. Have discussed supportive care instructions including increasing pain, urinary retention, vomiting, fevers despite being on antibodies for at least 40-72 hours. Have advised him to follow-up with his primary care physician in one week for reevaluation. He verbalized understanding and is comfortable with this plan.     I personally performed the services described in this documentation, which was scribed in my presence. The recorded information has been reviewed and is accurate.    [place], DO 11/18/14 1533

## 2014-11-18 NOTE — ED Notes (Signed)
PT c/o increased urinary frequency and burning with urination x2 days with lower abdominal pain and bilateral flank pain. PT denies any visible blood in urine.

## 2014-11-20 LAB — URINE CULTURE: Colony Count: >=100000

## 2014-11-23 ENCOUNTER — Telehealth (HOSPITAL_COMMUNITY): Payer: Self-pay

## 2014-11-23 NOTE — Telephone Encounter (Signed)
Post ED Visit - Positive Culture Follow-up  Culture report reviewed by antimicrobial stewardship pharmacist: []  Wes Dulaney, Pharm.D., BCPS []  Heide Guile, Pharm.D., BCPS []  Alycia Rossetti, Pharm.D., BCPS []  Murtaugh, Pharm.D., BCPS, AAHIVP []  Legrand Como, Pharm.D., BCPS, AAHIVP []  Isac Sarna, Pharm.D., BCPS  Positive Urine culture, >/= 100,000 colonies -> Staph Aureus Treated with Cephalexin, organism sensitive to the same and no further patient follow-up is required at this time.  Dortha Kern 11/23/2014, 5:36 AM

## 2015-09-01 ENCOUNTER — Ambulatory Visit (INDEPENDENT_AMBULATORY_CARE_PROVIDER_SITE_OTHER): Payer: Medicare Other | Admitting: Neurology

## 2015-09-01 ENCOUNTER — Encounter: Payer: Self-pay | Admitting: Neurology

## 2015-09-01 VITALS — BP 153/78 | HR 49 | Ht 71.5 in | Wt 207.4 lb

## 2015-09-01 DIAGNOSIS — G250 Essential tremor: Secondary | ICD-10-CM | POA: Insufficient documentation

## 2015-09-01 DIAGNOSIS — R519 Headache, unspecified: Secondary | ICD-10-CM

## 2015-09-01 DIAGNOSIS — R51 Headache: Secondary | ICD-10-CM

## 2015-09-01 DIAGNOSIS — G4710 Hypersomnia, unspecified: Secondary | ICD-10-CM | POA: Diagnosis not present

## 2015-09-01 DIAGNOSIS — R400 Somnolence: Secondary | ICD-10-CM

## 2015-09-01 NOTE — Patient Instructions (Signed)
Overall you are doing fairly well but I do want to suggest a few things today:   Remember to drink plenty of fluid, eat healthy meals and do not skip any meals. Try to eat protein with a every meal and eat a healthy snack such as fruit or nuts in between meals. Try to keep a regular sleep-wake schedule and try to exercise daily, particularly in the form of walking, 20-30 minutes a day, if you can.   As far as your medications are concerned, I would like to suggest: continue current medications  As far as diagnostic testing: MRi of the brain, lab, sleep study  I would like to see you back in 3 months, sooner if we need to. Please call us with any interim questions, concerns, problems, updates or refill requests.   Please also call us for any test results so we can go over those with you on the phone.  My clinical assistant and will answer any of your questions and relay your messages to me and also relay most of my messages to you.   Our phone number is [phone]. We also have an after hours call service for urgent matters and there is a physician on-call for urgent questions. For any emergencies you know to call 911 or go to the nearest emergency room

## 2015-09-01 NOTE — Progress Notes (Signed)
[facility]    Provider:  Dr Jaynee Eagles Referring Provider: Octavio Graves, DO Primary Care Physician:  Octavio Graves, DO  CC:  Essential tremor and headache  HPI:  Daniel Richards is a 69 y.o. male here as a referral from Dr. Melina Copa for headache and essential tremor. Past medical history of hyperlipidemia, type 2 diabetes, hypertension, migraine, vitamin D deficiency, depression with anxiety, essential tremor. The headaches started a year ago and he has been taking 200 mg of Topamax at night before bedtime. He takes topamax 200mg  at night but he still has headaches. He wakes up with headaches. He doesn't know if he snores at night. He is tired during the day, feel fatigued, feels weak. Shaking is worse with the headaches. Headaches wake him up at night and he wakes up with headaches. The headaches are all over with pressure. He is on a big dose of topamax at night. Headaches can be pretty severe and he needs to take pain medication. He has vision changes, blurry vision, with the headaches. These headaches are new onset, never had anything like this before. No light or sound sensitivity with the headaches. Tremor is worsening, and even are worse with the headaches. The tremor is worsening. He takes 9 primidone a day and the tremor is still worsening. He shakes very badly, the tremor is affecting his life, started years ago, he has difficulty eating and difficulty performing tasks. Right hand is worse than the left. Food falls off his utensils. Twin sister has tremor as does niece and son. Discussed occupational therapy for help with the tremor and help possibly with ADLs like weighted utencils, he declines. The headaches are daily, will last most of the day. Usually 4/10 but can be very bad especially when he wakes up with them can be 10/10. No inciting factors such as head trauma, no medications. No other focal neurologic symptoms. No seizures, no encephalopathy, no neck stiffness, no  fever or recent or remote illnesses associated with the headaches, no meningismus.  For his headaches he takes Topamax 200 mg in the evening. For his essential tremor he takes primidone 150 mg 3 times a day, and propranolol as well as Neurontin 300 mg 3 times a day. We discussed that Topamax is also a treatment for essential tremor so he is essentially on 4 medications that should help with essential tremor..   Reviewed notes, labs and imaging from outside physicians, which showed: Patient is seen by Octavio Graves at [facility]. This is an [place]. Patient has been having more trouble with tremor imbalance. No falls but staggers. He was referred for intractable headache and tremors.  Review of Systems: Patient complains of symptoms per HPI as well as the following symptoms: Headache, weakness, tremor. Pertinent negatives per HPI. All others negative.   Social History   Social History  . Marital Status: Divorced    Spouse Name: N/A  . Number of Children: 2  . Years of Education: 12+   Occupational History  . Not on file.   Social History Main Topics  . Smoking status: Never Smoker   . Smokeless tobacco: Not on file  . Alcohol Use: No  . Drug Use: No  . Sexual Activity: Not on file   Other Topics Concern  . Not on file   Social History Narrative   Lives at home with son.   Caffeine use: Drinks 1 cup coffee (3 cups per week-decaf)       Family  History  Problem Relation Age of Onset  . Cancer Sister   . Cancer Sister   . Cancer Brother     Past Medical History  Diagnosis Date  . Hypertension   . Diabetes mellitus 06/09/2011    pt taking metformin  . Migraines   . Vitamin D deficiency   . Anxiety and depression   . DJD of shoulder     right   . Tremor   . Hyperlipidemia     Past Surgical History  Procedure Laterality Date  . Shoulder surgery    . Knee surgery    . Back surgery      Current Outpatient Prescriptions  Medication  Sig Dispense Refill  . aspirin EC 325 MG tablet Take 325 mg by mouth every evening.    . cephALEXin (KEFLEX) 500 MG capsule Take 1 capsule (500 mg total) by mouth 2 (two) times daily. 14 capsule 0  . citalopram (CELEXA) 20 MG tablet Take 20 mg by mouth at bedtime.    . gabapentin (NEURONTIN) 300 MG capsule Take 300 mg by mouth 3 (three) times daily.    Marland Kitchen HYDROcodone-acetaminophen (NORCO) 10-325 MG per tablet Take 1 tablet by mouth every 6 (six) hours as needed for moderate pain.    Marland Kitchen lisinopril (PRINIVIL,ZESTRIL) 20 MG tablet Take 20 mg by mouth daily.    Marland Kitchen lovastatin (MEVACOR) 20 MG tablet Take 20 mg by mouth daily.    . metFORMIN (GLUCOPHAGE) 500 MG tablet Take 500 mg by mouth 2 (two) times daily with a meal.    . phenazopyridine (PYRIDIUM) 200 MG tablet Take 1 tablet (200 mg total) by mouth 3 (three) times daily as needed for pain. 15 tablet 0  . primidone (MYSOLINE) 50 MG tablet Take 150 mg by mouth 3 (three) times daily.     . Probiotic CAPS Take 1 capsule by mouth daily. 30 capsule 0  . propranolol (INDERAL) 40 MG tablet Take 40 mg by mouth 2 (two) times daily.    Marland Kitchen topiramate (TOPAMAX) 100 MG tablet Take 200 mg by mouth at bedtime.    . Vitamin D, Ergocalciferol, (DRISDOL) 50000 UNITS CAPS capsule Take 50,000 Units by mouth every 7 (seven) days.     No current facility-administered medications for this visit.    Allergies as of 09/01/2015  . (No Known Allergies)    Vitals: BP 153/78 mmHg  Pulse 49  Ht 5' 11.5" (1.816 m)  Wt 207 lb 6.4 oz (94.076 kg)  BMI 28.53 kg/m2 Last Weight:  Wt Readings from Last 1 Encounters:  09/01/15 207 lb 6.4 oz (94.076 kg)   Last Height:   Ht Readings from Last 1 Encounters:  09/01/15 5' 11.5" (1.816 m)    Physical exam: Exam: Gen: NAD, conversant, well nourised, overweight                     CV: RRR, no MRG. No Carotid Bruits. No peripheral edema, warm, nontender Eyes: Conjunctivae clear without exudates or hemorrhage  Neuro: Detailed  Neurologic Exam  Speech:    Speech is normal; fluent and spontaneous with normal comprehension.  Cognition:    The patient is oriented to person, place, and time;     recent and remote memory intact;     language fluent;     normal attention, concentration,     fund of knowledge Cranial Nerves:    The pupils are equal, round, and reactive to light. The fundi areflat.[place] fields are full  to finger confrontation. Extraocular movements are intact. Trigeminal sensation is intact and the muscles of mastication are normal. The face is symmetric. The palate elevates in the midline. Hearing intact. Voice is normal. Shoulder shrug is normal. The tongue has normal motion without fasciculations.   Coordination:    No dysmetria  Gait:    No ataxia  Motor Observation: high frequency, low amplitude head and upper extremity tremor with postural, resting and action components.   Tone:    Normal muscle tone.    Posture:    Posture is normal. normal erect    Strength:    Strength is V/V in the upper and lower limbs.      Sensation: intact to LT     Reflex Exam:  DTR's:    Deep tendon reflexes in the upper and lower extremities are normal bilaterally.   Toes:    The toes are downgoing bilaterally.   Clonus:    Clonus is absent.      Assessment/Plan:  69 year old patient with intractable headaches and essential tremor.  For his intractable headaches: I suspect obstructive sleep apnea and/or hypoventilation obesity syndrome given his snoring, obesity, excessive daytime somnolence and nocturnal and morning headaches. Will ask Dr. Rexene Alberts to evaluate. He is on 200 mg of Topamax at night, will not adjust his headache medications until after the evaluation for sleep study.  Essential tremor:    - SSRIs can worsen tremor such as celexa.      - patient is on multiple medications that are used to treat essential tremor including propranolol, mysoline, Topamax and neurontin. so he is  essentially on 4 medications that should help with essential tremor. He is already on high doses of Mysoline and Topamax. Cannot increase his propranolol due to his bradycardia. Suppose Neurontin would be the next medication we could increase. Did discuss other options such as deep brain stimulation. Maybe Dr. Rexene Alberts can address any other ideas during her sleep evaluation briefly.  CC: Dr. Jac Canavan, MD  [facility] [address] [place],  [address]  Phone [phone] Fax [phone]

## 2015-09-02 LAB — BASIC METABOLIC PANEL
BUN/Creatinine Ratio: 15 (ref 10–22)
BUN: 16 mg/dL (ref 8–27)
CO2: 25 mmol/L (ref 18–29)
Calcium: 9.1 mg/dL (ref 8.6–10.2)
Chloride: 101 mmol/L (ref 97–106)
Creatinine, Ser: 1.09 mg/dL (ref 0.76–1.27)
GFR calc Af Amer: 79 mL/min/{1.73_m2} (ref >59)
GFR calc non Af Amer: 68 mL/min/{1.73_m2} (ref >59)
Glucose: 99 mg/dL (ref 65–99)
Potassium: 4.8 mmol/L (ref 3.5–5.2)
Sodium: 141 mmol/L (ref 136–144)

## 2015-09-03 ENCOUNTER — Ambulatory Visit
Admission: RE | Admit: 2015-09-03 | Discharge: 2015-09-03 | Disposition: A | Discharge status: Home / Self Care | Payer: Medicare Other | Source: Ambulatory Visit | Attending: Neurology | Admitting: Neurology

## 2015-09-03 ENCOUNTER — Telehealth: Payer: Self-pay | Admitting: *Deleted

## 2015-09-03 DIAGNOSIS — R51 Headache: Principal | ICD-10-CM

## 2015-09-03 DIAGNOSIS — R519 Headache, unspecified: Secondary | ICD-10-CM

## 2015-09-03 DIAGNOSIS — R400 Somnolence: Secondary | ICD-10-CM

## 2015-09-03 MED ORDER — GADOBENATE DIMEGLUMINE 529 MG/ML IV SOLN
20.0000 mL | Freq: Once | INTRAVENOUS | Status: AC | PRN
  Administered 2015-09-03: 20 mL via INTRAVENOUS

## 2015-09-03 NOTE — Telephone Encounter (Signed)
Tammy/daughter, returned call. Advised labs were normal.

## 2015-09-03 NOTE — Telephone Encounter (Signed)
LVM for daughter, [name] (okay per DPR) to call back about results. Gave [facility] phone number and office hours. Okay to inform her labs are normal.

## 2015-09-03 NOTE — Telephone Encounter (Signed)
-----   Message from Melvenia Beam, MD sent at 09/02/2015  7:29 PM EDT ----- Let patient know labs were normal

## 2015-09-06 ENCOUNTER — Telehealth: Payer: Self-pay | Admitting: *Deleted

## 2015-09-06 ENCOUNTER — Encounter: Payer: Self-pay | Admitting: Neurology

## 2015-09-06 NOTE — Telephone Encounter (Signed)
LMTC./fim 

## 2015-09-06 NOTE — Telephone Encounter (Signed)
-----   Message from Melvenia Beam, MD sent at 09/06/2015  1:09 PM EDT ----- Let patient know the MRi of his brain is stable since 2012. There is mild atrophy and white matter changes that are appropriate for age. Nothing concerning for cause of headaches.

## 2015-09-07 NOTE — Telephone Encounter (Signed)
Daniel Richards, pt's daughter called returning [name]'s call. She can be reached at [phone]

## 2015-09-07 NOTE — Telephone Encounter (Signed)
I have spoken with Daniel Richards this afternoon and per Dr. Lavell Anchors, have advised that mri brain has been stable since 2012, there are just mild age related changes; nothing seen that would be a cause for headaches.  She verbalized understanding of same/fim

## 2015-09-22 ENCOUNTER — Encounter: Payer: Self-pay | Admitting: Neurology

## 2015-09-22 ENCOUNTER — Ambulatory Visit (INDEPENDENT_AMBULATORY_CARE_PROVIDER_SITE_OTHER): Payer: Medicare Other | Admitting: Neurology

## 2015-09-22 VITALS — BP 136/60 | HR 62 | Resp 16 | Ht 71.5 in | Wt 201.0 lb

## 2015-09-22 DIAGNOSIS — G4719 Other hypersomnia: Secondary | ICD-10-CM | POA: Diagnosis not present

## 2015-09-22 DIAGNOSIS — G250 Essential tremor: Secondary | ICD-10-CM

## 2015-09-22 DIAGNOSIS — R0683 Snoring: Secondary | ICD-10-CM | POA: Diagnosis not present

## 2015-09-22 DIAGNOSIS — R51 Headache: Secondary | ICD-10-CM | POA: Diagnosis not present

## 2015-09-22 DIAGNOSIS — R519 Headache, unspecified: Secondary | ICD-10-CM

## 2015-09-22 NOTE — Patient Instructions (Addendum)
Based on your symptoms and your exam I believe you are at risk for obstructive sleep apnea or OSA, and I think we should proceed with a sleep study to determine whether you do or do not have OSA and how severe it is. If you have more than mild OSA, I want you to consider treatment with CPAP. Please remember, the risks and ramifications of moderate to severe obstructive sleep apnea or OSA are: Cardiovascular disease, including congestive heart failure, stroke, difficult to control hypertension, arrhythmias, and even type 2 diabetes has been linked to untreated OSA. Sleep apnea causes disruption of sleep and sleep deprivation in most cases, which, in turn, can cause recurrent headaches, problems with memory, mood, concentration, focus, and vigilance. Most people with untreated sleep apnea report excessive daytime sleepiness, which can affect their ability to drive. Please do not drive if you feel sleepy.   I will likely see you back after your sleep study to go over the test results and where to go from there. We will call you after your sleep study to advise about the results (most likely, you will hear from Beverlee Nims, my nurse) and to set up an appointment at the time, as necessary.    Our sleep lab administrative assistant, Arrie Aran will meet with you or call you to schedule your sleep study. If you don't hear back from her by next week please feel free to call her at [phone]. This is her direct line and please leave a message with your phone number to call back if you get the voicemail box. She will call back as soon as possible.   Your sleepiness may be in part due to taking multiple sedating medications.

## 2015-09-22 NOTE — Progress Notes (Addendum)
Subjective:    Patient ID: Daniel Richards is a 69 y.o. male.  HPI     Star Age, MD, PhD [facility] [address] [place], [place] [address]  Dear Berta Minor,   I saw your patient, Daniel Richards, upon your kind request in my clinic today for initial consultation of his sleep disorder, in particular, concern for underlying obstructive sleep apnea. The patient is unaccompanied today. As you know, Daniel Richards is a 69 year old right-handed gentleman with an underlying medical history of hypertension, diabetes, overweight state, migraine headaches, vitamin D deficiency, anxiety, depression, tremors, hyperlipidemia and degenerative joint disease, status post shoulder surgery, knee surgery, and back surgery, who reports snoring, excessive daytime somnolence and nocturnal as well as morning headaches. I reviewed your office note from 09/01/2015. He has occasional RLS symptoms and has been told that he kicks his legs in his sleep. He sleeps all the time, he says. His ESS is 13/24. His bedtime is late, between 11 PM and 1 AM. His wake time is 7 AM. He spends a lot of time in bed. Of note, he is on gabapentin 300 mg 3 times a day, primidone 150 mg 3 times a day, Topamax 200 mg each night, and he takes occasional hydrocodone, not daily. He has low back pain. His son who lives with him has told him about his snoring but also sleep talking. The patient has woken himself up with sleep talking. He has woken himself up with a sense of gasping for air as well. He has had a long-standing history of tremors. He has a strong family history of tremors in his brother, his sister, his twin sister and one nephew. He quit smoking over 30 years ago, he does not drink any caffeine, he does not drink any alcohol. He is retired. He is divorced many years ago. He has a daughter as well.  His Past Medical History Is Significant For: Past Medical History  Diagnosis Date  . Hypertension   .  Diabetes mellitus 06/09/2011    pt taking metformin  . Migraines   . Vitamin D deficiency   . Anxiety and depression   . DJD of shoulder     right   . Tremor   . Hyperlipidemia     His Past Surgical History Is Significant For: Past Surgical History  Procedure Laterality Date  . Shoulder surgery    . Knee surgery    . Back surgery      His Family History Is Significant For: Family History  Problem Relation Age of Onset  . Cancer Sister   . Cancer Sister   . Cancer Brother   . Migraines Neg Hx     His Social History Is Significant For: Social History   Social History  . Marital Status: Divorced    Spouse Name: N/A  . Number of Children: 2  . Years of Education: 12+   Social History Main Topics  . Smoking status: Never Smoker   . Smokeless tobacco: None  . Alcohol Use: No  . Drug Use: No  . Sexual Activity: Not Asked   Other Topics Concern  . None   Social History Narrative   Lives at home with son.   Caffeine use: Drinks 1 cup coffee (3 cups per week-decaf)       His Allergies Are:  No Known Allergies:   His Current Medications Are:  Outpatient Encounter Prescriptions as of 09/22/2015  Medication Sig  .  aspirin EC 325 MG tablet Take 325 mg by mouth every evening.  . citalopram (CELEXA) 20 MG tablet Take 20 mg by mouth at bedtime.  . gabapentin (NEURONTIN) 300 MG capsule Take 300 mg by mouth 3 (three) times daily.  Marland Kitchen HYDROcodone-acetaminophen (NORCO) 10-325 MG per tablet Take 1 tablet by mouth every 6 (six) hours as needed for moderate pain.  Marland Kitchen lisinopril (PRINIVIL,ZESTRIL) 20 MG tablet Take 20 mg by mouth daily.  Marland Kitchen lovastatin (MEVACOR) 20 MG tablet Take 20 mg by mouth daily.  . primidone (MYSOLINE) 50 MG tablet Take 150 mg by mouth 3 (three) times daily.   . Probiotic CAPS Take 1 capsule by mouth daily.  . propranolol (INDERAL) 40 MG tablet Take 40 mg by mouth 2 (two) times daily.  Marland Kitchen topiramate (TOPAMAX) 100 MG tablet Take 200 mg by mouth at bedtime.   . Vitamin D, Ergocalciferol, (DRISDOL) 50000 UNITS CAPS capsule Take 50,000 Units by mouth every 7 (seven) days.  . [DISCONTINUED] cephALEXin (KEFLEX) 500 MG capsule Take 1 capsule (500 mg total) by mouth 2 (two) times daily.  . [DISCONTINUED] metFORMIN (GLUCOPHAGE) 500 MG tablet Take 500 mg by mouth 2 (two) times daily with a meal.  . [DISCONTINUED] phenazopyridine (PYRIDIUM) 200 MG tablet Take 1 tablet (200 mg total) by mouth 3 (three) times daily as needed for pain.   No facility-administered encounter medications on file as of 09/22/2015.  :  Review of Systems:  Out of a complete 14 point review of systems, all are reviewed and negative with the exception of these symptoms as listed below:   Review of Systems  Neurological: Positive for tremors and headaches.       Patient reports that he sleeps anytime he gets a chance, daytime sleepiness, no trouble falling asleep, vivid dreams, talks in sleep. He states that he has lost interest in all activities and only wants to sleep.    Epworth Sleepiness Scale 0= would never doze 1= slight chance of dozing 2= moderate chance of dozing 3= high chance of dozing  Sitting and reading:1 Watching TV:2 Sitting inactive in a public place (ex. Theater or meeting):0 As a passenger in a car for an hour without a break:3 Lying down to rest in the afternoon:3 Sitting and talking to someone:0 Sitting quietly after lunch (no alcohol):3 In a car, while stopped in traffic:1 Total:13 Objective:  Neurologic Exam  Physical Exam Physical Examination:   Filed Vitals:   09/22/15 1410  BP: 136/60  Pulse: 62  Resp: 16   General Examination: The patient is a very pleasant 69 y.o. male in no acute distress. He appears well-developed and well-nourished and well groomed.   HEENT: Normocephalic, atraumatic, pupils are equal, round and reactive to light and accommodation. Funduscopic exam is normal with sharp disc margins noted. Extraocular tracking is  good without limitation to gaze excursion or nystagmus noted. Normal smooth pursuit is noted. Hearing is grossly intact. Face is symmetric with normal facial animation and normal facial sensation. Speech is clear with no dysarthria noted. There is no hypophonia. There is a significant lower lip, nodding type head, and a jaw tremor, not much of a voice tremor. Neck is supple with full range of passive and active motion. There are no carotid bruits on auscultation. Oropharynx exam reveals: mild mouth dryness, adequate dental hygiene and moderate airway crowding, due to larger tongue and redundant soft palate. Tonsils are 1+, right side more prominent than left. Mallampati is class II. Tongue protrudes centrally and palate  elevates symmetrically. Neck size is 16 1/8 inches. He has a Mild overbite. Nasal inspection reveals no significant nasal mucosal bogginess or redness and no septal deviation.   Chest: Clear to auscultation without wheezing, rhonchi or crackles noted.  Heart: S1+S2+0, regular and normal without murmurs, rubs or gallops noted.   Abdomen: Soft, non-tender and non-distended with normal bowel sounds appreciated on auscultation.  Extremities: There is no pitting edema in the distal lower extremities bilaterally. Pedal pulses are intact.  Skin: Warm and dry without trophic changes noted. There are no varicose veins.  Musculoskeletal: exam reveals no obvious joint deformities, tenderness or joint swelling or erythema.   Neurologically:  Mental status: The patient is awake, alert and oriented in all 4 spheres. His immediate and remote memory, attention, language skills and fund of knowledge are appropriate. There is no evidence of aphasia, agnosia, apraxia or anomia. Speech is clear with normal prosody and enunciation. Thought process is linear. Mood is normal and affect is normal.  Cranial nerves II - XII are as described above under HEENT exam. In addition: shoulder shrug is normal with  equal shoulder height noted. Motor exam: Normal bulk, strength and tone is noted. There is no drift, or rebound. He has a b/l UE postural and action tremor, moderate in degree with mild resting component. Reflexes are 1-2+ throughout. Fine motor skills and coordination: intact. Heel to shin is unremarkable bilaterally. There is no truncal or gait ataxia.  Sensory exam: intact to light touch in the upper and lower extremities.  Gait, station and balance: He stands easily. No veering to one side is noted. No leaning to one side is noted. Posture is age-appropriate and stance is narrow based. Gait shows normal stride length and normal pace. No problems turning are noted. He turns en bloc.               Assessment and Plan:   In summary, Daniel Richards is a very pleasant 69 y.o.-year old male with an underlying medical history of hypertension, diabetes, overweight state, migraine headaches, vitamin D deficiency, anxiety, depression, tremors, hyperlipidemia and degenerative joint disease, status post shoulder surgery, knee surgery, and back surgery, whose history and physical exam concerning for obstructive sleep apnea (OSA). For his significant essential tremor, he is on multiple medications and unfortunately, his EDS may at least in part be a medication related cumulative sedated effect. He may be a good candidate for DBS surgery. You can consider a referral for this. He has a strong family history and his siblings and one nephew for ET. I had a long chat with the patient about my findings and the diagnosis of OSA, its prognosis and treatment options. We talked about medical treatments, surgical interventions and non-pharmacological approaches. I explained in particular the risks and ramifications of untreated moderate to severe OSA, especially with respect to developing cardiovascular disease down the Road, including congestive heart failure, difficult to treat hypertension, cardiac arrhythmias, or stroke. Even  type 2 diabetes has, in part, been linked to untreated OSA. Symptoms of untreated OSA include daytime sleepiness, memory problems, mood irritability and mood disorder such as depression and anxiety, lack of energy, as well as recurrent headaches, especially morning headaches. We talked about trying to maintain a healthy lifestyle in general, as well as the importance of weight control. I encouraged the patient to eat healthy, exercise daily and keep well hydrated, to keep a scheduled bedtime and wake time routine, to not skip any meals and eat healthy snacks  in between meals. I advised the patient not to drive when feeling sleepy. I recommended the following at this time: sleep study with potential positive airway pressure titration. (We will score hypopneas at 4% and split the sleep study into diagnostic and treatment portion, if the estimated. 2 hour AHI is >15/h).   I explained the sleep test procedure to the patient and also outlined possible surgical and non-surgical treatment options of OSA, including the use of a custom-made dental device (which would require a referral to a specialist dentist or oral surgeon), upper airway surgical options, such as pillar implants, radiofrequency surgery, tongue base surgery, and UPPP (which would involve a referral to an ENT surgeon). Rarely, jaw surgery such as mandibular advancement may be considered.  I also explained the CPAP treatment option to the patient, who indicated that he would be willing to try CPAP if the need arises. I explained the importance of being compliant with PAP treatment, not only for insurance purposes but primarily to improve His symptoms, and for the patient's long term health benefit, including to reduce His cardiovascular risks. I answered all his questions today and the patient was in agreement. I would like to see him back after the sleep study is completed and encouraged him to call with any interim questions, concerns, problems or  updates.   Thank you very much for allowing me to participate in the care of this nice patient. If I can be of any further assistance to you please do not hesitate to talk to me.  Sincerely,   Star Age, MD, PhD

## 2015-09-29 ENCOUNTER — Telehealth: Payer: Self-pay | Admitting: Neurology

## 2015-09-29 NOTE — Telephone Encounter (Signed)
Patient called and states per his daughters advice he will not being having a sleep study.  His daughter doesn't think he has sleep apnea.

## 2015-10-04 NOTE — Telephone Encounter (Signed)
Both Dr. Jaynee Eagles and myself felt he may be at risk for sleep apnea. Please see if he would be covered for a HST?

## 2016-11-16 ENCOUNTER — Encounter (HOSPITAL_COMMUNITY): Payer: Self-pay | Admitting: Emergency Medicine

## 2016-11-16 ENCOUNTER — Emergency Department (HOSPITAL_COMMUNITY): Payer: Medicare Other

## 2016-11-16 ENCOUNTER — Inpatient Hospital Stay (HOSPITAL_COMMUNITY)
Admission: EM | Admit: 2016-11-16 | Discharge: 2016-11-18 | DRG: 100 | Disposition: A | Discharge status: Home Health | Payer: Medicare Other | Attending: Internal Medicine | Admitting: Internal Medicine

## 2016-11-16 DIAGNOSIS — R52 Pain, unspecified: Secondary | ICD-10-CM

## 2016-11-16 DIAGNOSIS — F419 Anxiety disorder, unspecified: Secondary | ICD-10-CM | POA: Diagnosis present

## 2016-11-16 DIAGNOSIS — I10 Essential (primary) hypertension: Secondary | ICD-10-CM | POA: Diagnosis present

## 2016-11-16 DIAGNOSIS — M19019 Primary osteoarthritis, unspecified shoulder: Secondary | ICD-10-CM | POA: Diagnosis present

## 2016-11-16 DIAGNOSIS — R569 Unspecified convulsions: Secondary | ICD-10-CM | POA: Diagnosis present

## 2016-11-16 DIAGNOSIS — E785 Hyperlipidemia, unspecified: Secondary | ICD-10-CM | POA: Diagnosis present

## 2016-11-16 DIAGNOSIS — G250 Essential tremor: Secondary | ICD-10-CM | POA: Diagnosis present

## 2016-11-16 DIAGNOSIS — R400 Somnolence: Secondary | ICD-10-CM | POA: Diagnosis not present

## 2016-11-16 DIAGNOSIS — Z981 Arthrodesis status: Secondary | ICD-10-CM

## 2016-11-16 DIAGNOSIS — E119 Type 2 diabetes mellitus without complications: Secondary | ICD-10-CM | POA: Diagnosis present

## 2016-11-16 DIAGNOSIS — G43909 Migraine, unspecified, not intractable, without status migrainosus: Secondary | ICD-10-CM | POA: Diagnosis present

## 2016-11-16 DIAGNOSIS — F329 Major depressive disorder, single episode, unspecified: Secondary | ICD-10-CM | POA: Diagnosis present

## 2016-11-16 DIAGNOSIS — G894 Chronic pain syndrome: Secondary | ICD-10-CM | POA: Diagnosis present

## 2016-11-16 DIAGNOSIS — R51 Headache: Secondary | ICD-10-CM | POA: Diagnosis not present

## 2016-11-16 DIAGNOSIS — Z7982 Long term (current) use of aspirin: Secondary | ICD-10-CM | POA: Diagnosis not present

## 2016-11-16 DIAGNOSIS — R4182 Altered mental status, unspecified: Secondary | ICD-10-CM

## 2016-11-16 DIAGNOSIS — G9340 Encephalopathy, unspecified: Secondary | ICD-10-CM | POA: Diagnosis present

## 2016-11-16 DIAGNOSIS — W182XXD Fall in (into) shower or empty bathtub, subsequent encounter: Secondary | ICD-10-CM

## 2016-11-16 DIAGNOSIS — S2232XD Fracture of one rib, left side, subsequent encounter for fracture with routine healing: Secondary | ICD-10-CM

## 2016-11-16 DIAGNOSIS — S2239XA Fracture of one rib, unspecified side, initial encounter for closed fracture: Secondary | ICD-10-CM

## 2016-11-16 DIAGNOSIS — E538 Deficiency of other specified B group vitamins: Secondary | ICD-10-CM | POA: Diagnosis present

## 2016-11-16 DIAGNOSIS — R270 Ataxia, unspecified: Secondary | ICD-10-CM

## 2016-11-16 DIAGNOSIS — R531 Weakness: Secondary | ICD-10-CM | POA: Diagnosis present

## 2016-11-16 DIAGNOSIS — W19XXXA Unspecified fall, initial encounter: Secondary | ICD-10-CM

## 2016-11-16 DIAGNOSIS — Z9181 History of falling: Secondary | ICD-10-CM

## 2016-11-16 DIAGNOSIS — R519 Headache, unspecified: Secondary | ICD-10-CM | POA: Diagnosis present

## 2016-11-16 DIAGNOSIS — M6281 Muscle weakness (generalized): Secondary | ICD-10-CM

## 2016-11-16 LAB — CBC
HCT: 40.8 % (ref 39.0–52.0)
Hemoglobin: 13.3 g/dL (ref 13.0–17.0)
MCH: 32.2 pg (ref 26.0–34.0)
MCHC: 32.6 g/dL (ref 30.0–36.0)
MCV: 98.8 fL (ref 78.0–100.0)
Platelets: 223 10*3/uL (ref 150–400)
RBC: 4.13 MIL/uL — ABNORMAL LOW (ref 4.22–5.81)
RDW: 12.7 % (ref 11.5–15.5)
WBC: 6.8 10*3/uL (ref 4.0–10.5)

## 2016-11-16 LAB — RAPID URINE DRUG SCREEN, HOSP PERFORMED
Amphetamines: NOT DETECTED
Barbiturates: POSITIVE — AB
Benzodiazepines: NOT DETECTED
Cocaine: NOT DETECTED
Opiates: POSITIVE — AB
Tetrahydrocannabinol: NOT DETECTED

## 2016-11-16 LAB — COMPREHENSIVE METABOLIC PANEL
ALT: 14 U/L — ABNORMAL LOW (ref 17–63)
AST: 16 U/L (ref 15–41)
Albumin: 3.8 g/dL (ref 3.5–5.0)
Alkaline Phosphatase: 103 U/L (ref 38–126)
Anion gap: 4 — ABNORMAL LOW (ref 5–15)
BUN: 14 mg/dL (ref 6–20)
CO2: 28 mmol/L (ref 22–32)
Calcium: 8.6 mg/dL — ABNORMAL LOW (ref 8.9–10.3)
Chloride: 108 mmol/L (ref 101–111)
Creatinine, Ser: 1.36 mg/dL — ABNORMAL HIGH (ref 0.61–1.24)
GFR calc Af Amer: 58 mL/min — ABNORMAL LOW (ref >60)
GFR calc non Af Amer: 50 mL/min — ABNORMAL LOW (ref >60)
Glucose, Bld: 121 mg/dL — ABNORMAL HIGH (ref 65–99)
Potassium: 4.6 mmol/L (ref 3.5–5.1)
Sodium: 140 mmol/L (ref 135–145)
Total Bilirubin: 0.3 mg/dL (ref 0.3–1.2)
Total Protein: 7.2 g/dL (ref 6.5–8.1)

## 2016-11-16 LAB — DIFFERENTIAL
Basophils Absolute: 0 10*3/uL (ref 0.0–0.1)
Basophils Relative: 0 %
Eosinophils Absolute: 0.2 10*3/uL (ref 0.0–0.7)
Eosinophils Relative: 3 %
Lymphocytes Relative: 26 %
Lymphs Abs: 1.7 10*3/uL (ref 0.7–4.0)
Monocytes Absolute: 0.6 10*3/uL (ref 0.1–1.0)
Monocytes Relative: 8 %
Neutro Abs: 4.4 10*3/uL (ref 1.7–7.7)
Neutrophils Relative %: 63 %

## 2016-11-16 LAB — URINALYSIS, ROUTINE W REFLEX MICROSCOPIC
Bilirubin Urine: NEGATIVE
Glucose, UA: NEGATIVE mg/dL
Hgb urine dipstick: NEGATIVE
Ketones, ur: NEGATIVE mg/dL
Leukocytes, UA: NEGATIVE
Nitrite: NEGATIVE
Protein, ur: NEGATIVE mg/dL
Specific Gravity, Urine: 1.02 (ref 1.005–1.030)
pH: 5 (ref 5.0–8.0)

## 2016-11-16 LAB — PROTIME-INR
INR: 1.05
Prothrombin Time: 13.7 seconds (ref 11.4–15.2)

## 2016-11-16 LAB — I-STAT TROPONIN, ED: Troponin i, poc: 0 ng/mL (ref 0.00–0.08)

## 2016-11-16 LAB — ETHANOL: Alcohol, Ethyl (B): <5 mg/dL (ref <5)

## 2016-11-16 LAB — APTT: aPTT: 28 seconds (ref 24–36)

## 2016-11-16 MED ORDER — SODIUM CHLORIDE 0.9% FLUSH
3.0000 mL | Freq: Two times a day (BID) | INTRAVENOUS | Status: DC
  Administered 2016-11-17: 3 mL via INTRAVENOUS

## 2016-11-16 MED ORDER — LISINOPRIL 10 MG PO TABS
20.0000 mg | ORAL_TABLET | Freq: Every day | ORAL | Status: DC
  Administered 2016-11-17 (×2): 20 mg via ORAL
  Filled 2016-11-16 (×2): qty 2

## 2016-11-16 MED ORDER — ASPIRIN EC 325 MG PO TBEC
325.0000 mg | DELAYED_RELEASE_TABLET | Freq: Every evening | ORAL | Status: DC
  Administered 2016-11-17 – 2016-11-18 (×2): 325 mg via ORAL
  Filled 2016-11-16 (×2): qty 1

## 2016-11-16 MED ORDER — DEXTROSE-NACL 5-0.9 % IV SOLN: INTRAVENOUS | Status: DC

## 2016-11-16 MED ORDER — PRIMIDONE 50 MG PO TABS
75.0000 mg | ORAL_TABLET | Freq: Three times a day (TID) | ORAL | Status: DC
  Administered 2016-11-17 – 2016-11-18 (×5): 75 mg via ORAL
  Filled 2016-11-16 (×12): qty 1.5

## 2016-11-16 MED ORDER — TOPIRAMATE 100 MG PO TABS
200.0000 mg | ORAL_TABLET | Freq: Every day | ORAL | Status: DC
  Administered 2016-11-17: 200 mg via ORAL
  Filled 2016-11-16 (×5): qty 2

## 2016-11-16 MED ORDER — CITALOPRAM HYDROBROMIDE 20 MG PO TABS
20.0000 mg | ORAL_TABLET | Freq: Every day | ORAL | Status: DC
  Administered 2016-11-17 (×2): 20 mg via ORAL
  Filled 2016-11-16 (×2): qty 1

## 2016-11-16 MED ORDER — PROPRANOLOL HCL 20 MG PO TABS
40.0000 mg | ORAL_TABLET | Freq: Two times a day (BID) | ORAL | Status: DC
  Administered 2016-11-17 – 2016-11-18 (×2): 40 mg via ORAL
  Filled 2016-11-16 (×4): qty 2

## 2016-11-16 MED ORDER — GABAPENTIN 300 MG PO CAPS
300.0000 mg | ORAL_CAPSULE | Freq: Three times a day (TID) | ORAL | Status: DC
  Administered 2016-11-17 – 2016-11-18 (×5): 300 mg via ORAL
  Filled 2016-11-16 (×6): qty 1

## 2016-11-16 NOTE — ED Notes (Signed)
Pt a&o x 4.  Ask for urinal.  States "i feel much better."

## 2016-11-16 NOTE — ED Triage Notes (Signed)
Son called 911 d/t he came to pt's house and found pt lying in the floor.  Son reports pt sometimes takes more pain medication than is rxd.  Pt fell within the last week and fx lt ribs.  Admits to taking three 5/325 norco this morning d/t lt rib pain. A&O x 4.  Pt is drowsy and falls asleep during talking.

## 2016-11-16 NOTE — ED Provider Notes (Signed)
[facility] DEPT Provider Note   CSN: [id number] Arrival date & time: 11/16/16  1454     History   Chief Complaint Chief Complaint  Patient presents with  . Fall    HPI Daniel Richards is a 71 y.o. male.  HPI Patient presents to the emergency room with complaints of weakness and falls.  Patient states he fell earlier in the week. He was evaluated at another medical facility. He was diagnosed with rib fractures. Patient was at home today when he says he became weak all of a sudden fell to the ground again. He denies any injuries with this fall. He felt too weak to stand up.  It is unclear if he had a loss of consciousness. He does recall being on the floor and not being able to stand up because his whole body felt weak. Patient was found by his son lying on the floor. Son called 911 and he was sent to the emergency room for evaluation. Patient states start friend explain what happening. As an feel good. He has difficulty elaborating on what he doesn't feel good other than having generalized weakness. He initially denied having any pain but later admitted he was having pain in his back. It hurts him to try to sit up. Specifically denies having any cough or chest pain, shortness of breath, abdominal pain, vomiting, diarrhea, fevers, headache, blood in his stool or dark stools.  Past Medical History:  Diagnosis Date  . Anxiety and depression   . Diabetes mellitus 06/09/2011   Daniel Richards taking metformin  . DJD of shoulder    right   . Hyperlipidemia   . Hypertension   . Migraines   . Tremor   . Vitamin D deficiency     Patient Active Problem List   Diagnosis Date Noted  . New onset of headaches after age 86 09/01/2015  . Morning headache 09/01/2015  . Daytime somnolence 09/01/2015  . Essential tremor 09/01/2015  . Intractable headache 09/01/2015    Past Surgical History:  Procedure Laterality Date  . BACK SURGERY    . KNEE SURGERY    . SHOULDER SURGERY         Home  Medications    Prior to Admission medications   Medication Sig Start Date End Date Taking? Authorizing Provider  aspirin EC 325 MG tablet Take 325 mg by mouth every evening.   Yes Historical Provider, MD  citalopram (CELEXA) 20 MG tablet Take 20 mg by mouth at bedtime.   Yes Historical Provider, MD  gabapentin (NEURONTIN) 300 MG capsule Take 600 mg by mouth 3 (three) times daily.    Yes Historical Provider, MD  HYDROcodone-acetaminophen (NORCO) 10-325 MG per tablet Take 1 tablet by mouth every 6 (six) hours as needed for moderate pain.   Yes Historical Provider, MD  lisinopril (PRINIVIL,ZESTRIL) 20 MG tablet Take 20 mg by mouth at bedtime.    Yes Historical Provider, MD  lovastatin (MEVACOR) 20 MG tablet Take 20 mg by mouth at bedtime.    Yes Historical Provider, MD  primidone (MYSOLINE) 50 MG tablet Take 150 mg by mouth 3 (three) times daily.    Yes Historical Provider, MD  propranolol (INDERAL) 40 MG tablet Take 40 mg by mouth 2 (two) times daily.   Yes Historical Provider, MD  topiramate (TOPAMAX) 100 MG tablet Take 200 mg by mouth at bedtime.   Yes Historical Provider, MD    Family History Family History  Problem Relation Age of Onset  . Cancer  Sister   . Cancer Sister   . Cancer Brother   . Migraines Neg Hx     Social History Social History  Substance Use Topics  . Smoking status: Never Smoker  . Smokeless tobacco: Never Used  . Alcohol use No     Allergies   Patient has no known allergies.   Review of Systems Review of Systems  Constitutional: Negative for fever.  HENT: Negative for rhinorrhea.   Respiratory: Negative for choking and shortness of breath.   Cardiovascular: Negative for chest pain.  Gastrointestinal: Negative for abdominal pain, diarrhea, nausea and vomiting.  Genitourinary: Negative for dysuria and urgency.  Musculoskeletal: Positive for back pain.  Neurological: Negative for dizziness and headaches.  Psychiatric/Behavioral: Negative for  hallucinations.  All other systems reviewed and are negative.    Physical Exam Updated Vital Signs BP 144/86   Pulse (!) 51   Temp 97.9 F (36.6 C) (Oral)   Resp 11   Ht 5\' 11"  (1.803 m)   Wt 96.2 kg   SpO2 98%   BMI 29.57 kg/m   Physical Exam  Constitutional: No distress.  HENT:  Head: Normocephalic and atraumatic.  Right Ear: External ear normal.  Left Ear: External ear normal.  Eyes: Conjunctivae are normal. Right eye exhibits no discharge. Left eye exhibits no discharge. No scleral icterus.  Neck: Neck supple. No tracheal deviation present.  Cardiovascular: Normal rate, regular rhythm and intact distal pulses.   Pulmonary/Chest: Effort normal and breath sounds normal. No stridor. No respiratory distress. He has no wheezes. He has no rales. He exhibits tenderness (ttp posteriorly ribs).  Abdominal: Soft. Bowel sounds are normal. He exhibits no distension. There is no tenderness. There is no rebound and no guarding.  Musculoskeletal: He exhibits no edema or tenderness.  Neurological: He is alert. No cranial nerve deficit (no facial droop, extraocular movements intact, no slurred speech) or sensory deficit. He exhibits normal muscle tone. He displays no seizure activity. Coordination (unable to stand and walk) abnormal.  Able to lift both legs off the bed, able to lift both arms off the bed but difficulty keeping arms extended, unable to support himself when attempting to sit up in bed with assistance, decreased arm strength bilaterally  Skin: Skin is warm and dry. No rash noted. He is not diaphoretic.  Psychiatric: He has a normal mood and affect.  Nursing note and vitals reviewed.    ED Treatments / Results  Labs (all labs ordered are listed, but only abnormal results are displayed) Labs Reviewed  CBC - Abnormal; Notable for the following:       Result Value   RBC 4.13 (*)    All other components within normal limits  COMPREHENSIVE METABOLIC PANEL - Abnormal; Notable  for the following:    Glucose, Bld 121 (*)    Creatinine, Ser 1.36 (*)    Calcium 8.6 (*)    ALT 14 (*)    GFR calc non Af Amer 50 (*)    GFR calc Af Amer 58 (*)    Anion gap 4 (*)    All other components within normal limits  RAPID URINE DRUG SCREEN, HOSP PERFORMED - Abnormal; Notable for the following:    Opiates POSITIVE (*)    Barbiturates POSITIVE (*)    All other components within normal limits  ETHANOL  PROTIME-INR  APTT  DIFFERENTIAL  URINALYSIS, ROUTINE W REFLEX MICROSCOPIC  I-STAT TROPOININ, ED    EKG  EKG Interpretation  Date/Time:  Thursday November 16 2016 15:15:42 EST Ventricular Rate:  48 PR Interval:    QRS Duration: 113 QT Interval:  450 QTC Calculation: 402 R Axis:   49 Text Interpretation:  Sinus bradycardia Borderline intraventricular conduction delay Since last tracing rate slower Otherwise no significant change Confirmed by Mariame Rybolt  MD-J, Aricela Bertagnolli [id number]) on 11/16/2016 3:30:42 PM       Radiology Dg Chest 1 View  Result Date: 11/16/2016 CLINICAL DATA:  Status post fall two days ago getting out of the bathtub at home striking the side of the body on the edge of the toe. The patient reports left-sided back pain and posterior left rib discomfort. EXAM: CHEST 1 VIEW COMPARISON:  PA and lateral chest x-ray of June 09, 2011 FINDINGS: The lungs are well-expanded. There is no evidence of a pulmonary contusion, pleural effusion, or pneumothorax. The heart and pulmonary vascularity are normal. The mediastinum is normal in width. There may be a nondisplaced fracture through the posterolateral aspect of the left tenth rib. IMPRESSION: Possible nondisplaced fracture of the lateral aspect of the left tenth rib. The ribs are not completely included in the field of view. No acute cardiopulmonary abnormality. Electronically Signed   By: David  Martinique M.D.   On: 11/16/2016 16:16   Dg Thoracic Spine W/swimmers  Result Date: 11/16/2016 CLINICAL DATA:  [place] Tuesday at home getting  out of the bath tub striking side on the tub, LEFT side back pain EXAM: THORACIC SPINE - 3 VIEWS COMPARISON:  None FINDINGS: Twelve pairs of ribs. Diffuse osseous demineralization. Scattered mild vertebral endplate spur formation. Vertebral body heights maintained without fracture or subluxation. No bone destruction. Visualized posterior ribs unremarkable. 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 No acute abnormalities. Electronically Signed   By: Lavonia Dana M.D.   On: 11/16/2016 16:17   Dg Lumbar Spine Complete  Result Date: 11/16/2016 CLINICAL DATA:  [place] 2 days ago with back pain and weakness EXAM: LUMBAR SPINE - COMPLETE 4+ VIEW COMPARISON:  Lumbar spine films of 05/18/2015 FINDINGS: The lumbar vertebrae are unchanged in alignment. Intervertebral disc spaces appear normal and no compression deformity is seen. The bones are slightly osteopenic. The SI joints appear corticated. The sacral foramina are unremarkable. Moderate abdominal aortic atherosclerosis is noted. IMPRESSION: 1. Osteopenia.  No acute compression deformity. 2. Moderate abdominal aortic atherosclerosis. Electronically Signed   By: Ivar Drape M.D.   On: 11/16/2016 16:17   Ct Head Wo Contrast  Result Date: 11/16/2016 CLINICAL DATA:  71 y/o M; found lying floor. History of recent falls. EXAM: CT HEAD WITHOUT CONTRAST CT CERVICAL SPINE WITHOUT CONTRAST TECHNIQUE: Multidetector CT imaging of the head and cervical spine was performed following the standard protocol without intravenous contrast. Multiplanar CT image reconstructions of the cervical spine were also generated. COMPARISON:  09/03/2015 MRI brain.  11/30/2011 MRI cervical spine. FINDINGS: CT HEAD FINDINGS Brain: No evidence of acute infarction, hemorrhage, hydrocephalus, extra-axial collection or mass lesion/mass effect. Mild chronic microvascular ischemic changes and parenchymal volume loss of the brain. Vascular: Calcific  atherosclerosis of cavernous internal carotid arteries. Skull: Normal. Negative for fracture or focal lesion. Sinuses/Orbits: No acute finding. Other: None. CT CERVICAL SPINE FINDINGS Alignment: Straightening of cervical lordosis.  No dislocation. Skull base and vertebrae: No acute fracture. No primary bone lesion or focal pathologic process. Anterior cervical discectomy and fusion of C5 and C6. No apparent hardware related complication or periprosthetic lucency. Soft tissues and spinal canal: No prevertebral fluid or swelling. No visible canal hematoma.  Disc levels: Mild cervical spondylosis with discogenic and facet arthropathy greatest from C4 through C7. No high-grade bony canal stenosis or foraminal narrowing. Upper chest: Negative. Other: Several partially calcified nodules within the thyroid gland measuring up to 11 mm in the right lobe of thyroid. Moderate calcific atherosclerosis of the left hand mild of the right carotid bifurcations. On IMPRESSION: 1. No acute intracranial abnormality. 2. No acute fracture or dislocation of cervical spine. 3. Mild chronic microvascular ischemic changes and parenchymal volume loss of the brain. 4. Mild cervical spondylosis. 5. Left-greater-than-right calcific atherosclerosis of carotid bifurcations. 6. Thyroid nodules with coarse calcifications measuring up to 11 mm on the right. These can be further evaluated with thyroid ultrasound on a nonemergent basis. Electronically Signed   By: Kristine Garbe M.D.   On: 11/16/2016 16:33   Ct Cervical Spine Wo Contrast  Result Date: 11/16/2016 CLINICAL DATA:  71 y/o M; found lying floor. History of recent falls. EXAM: CT HEAD WITHOUT CONTRAST CT CERVICAL SPINE WITHOUT CONTRAST TECHNIQUE: Multidetector CT imaging of the head and cervical spine was performed following the standard protocol without intravenous contrast. Multiplanar CT image reconstructions of the cervical spine were also generated. COMPARISON:  09/03/2015  MRI brain.  11/30/2011 MRI cervical spine. FINDINGS: CT HEAD FINDINGS Brain: No evidence of acute infarction, hemorrhage, hydrocephalus, extra-axial collection or mass lesion/mass effect. Mild chronic microvascular ischemic changes and parenchymal volume loss of the brain. Vascular: Calcific atherosclerosis of cavernous internal carotid arteries. Skull: Normal. Negative for fracture or focal lesion. Sinuses/Orbits: No acute finding. Other: None. CT CERVICAL SPINE FINDINGS Alignment: Straightening of cervical lordosis.  No dislocation. Skull base and vertebrae: No acute fracture. No primary bone lesion or focal pathologic process. Anterior cervical discectomy and fusion of C5 and C6. No apparent hardware related complication or periprosthetic lucency. Soft tissues and spinal canal: No prevertebral fluid or swelling. No visible canal hematoma. Disc levels: Mild cervical spondylosis with discogenic and facet arthropathy greatest from C4 through C7. No high-grade bony canal stenosis or foraminal narrowing. Upper chest: Negative. Other: Several partially calcified nodules within the thyroid gland measuring up to 11 mm in the right lobe of thyroid. Moderate calcific atherosclerosis of the left hand mild of the right carotid bifurcations. On IMPRESSION: 1. No acute intracranial abnormality. 2. No acute fracture or dislocation of cervical spine. 3. Mild chronic microvascular ischemic changes and parenchymal volume loss of the brain. 4. Mild cervical spondylosis. 5. Left-greater-than-right calcific atherosclerosis of carotid bifurcations. 6. Thyroid nodules with coarse calcifications measuring up to 11 mm on the right. These can be further evaluated with thyroid ultrasound on a nonemergent basis. Electronically Signed   By: Kristine Garbe M.D.   On: 11/16/2016 16:33    Procedures Procedures (including critical care time)  Medications Ordered in ED Medications - No data to display   Initial Impression /  Assessment and Plan / ED Course  I have reviewed the triage vital signs and the nursing notes.  Pertinent labs & imaging results that were available during my care of the patient were reviewed by me and considered in my medical decision making (see chart for details).  Clinical Course as of Nov 16 2030  Thu Nov 16, 2016  1620 Initial labs reassuring.  Slight increase in creatinine and glucose otherwise normal  [JK]  1621 Rib fx noted  [JK]  2004 Discussed findings with patient and family.  He still feels weak.  Will see if he can ambulate.  Posterior circulation stroke is a concern.  [  JK]  2028 Family is concerned.  Daniel Richards appears to be having some spells where he is staring off.  No syncope, no shaking but he does not respond immediately.  [JK]    Clinical Course User Index [JK] Dorie Rank, MD    Daniel Richards presents to the ED with complaints of weakness.  Change in mental status, confusion.  Recent falls and sustained a rib fracture.  CT scan does not show any sign of hemorrhage or definite stroke.  Sx are concerning for the possibility of occult stroke, ie posterior circulation.  ?atypical seizure with the staring spell.  Medication interaction causing encephalopathy?  I will consult for admission.  Family would prefer [facility].  I do not know if there is any bed availability.  Will consult with hospitalist.  Final Clinical Impressions(s) / ED Diagnoses   Final diagnoses:  Altered mental status, unspecified altered mental status type  Ataxia      Dorie Rank, MD 11/16/16 2033

## 2016-11-16 NOTE — H&P (Signed)
History and Physical    [name] IZT:[id number] DOB: [birth year] DOA: 11/16/2016  PCP: Daniel Graves, DO  Patient coming from: Home.    Chief Complaint:   Altered mental status, and diffuse weakness, but markedly lower extremities.   HPI: Daniel Richards is an 71 y.o. male with hx chronic pain on Percocet 49m 2 tablets q hs, anxiety and depression, HTN, HLD on statin, migraine HA on Topamax, tremors on Primidone, lives at home with his son, fell a few days and had rib Fx, found weak this am and more confused and delirious.  He normally has some trouble with his memory, but was more functional, as he was able to go to [place] [place] day before.  This am, son found him on the floor, not able to stand up.  He was confused and "delirious".  There were reported that he would "blank out" after a grunt, and has been sleepy quite frequently, but that is not new.  There was thought from his neurologist before about OSA, and planned was to performed home sleep studies.  Evaluation in the ER showed unremarkable serology, CT of the neck and head was non contributory to his symptoms.  Hospitalist was asked to admit him for further evaluation.   ED Course:  See above.  Rewiew of Systems:  Constitutional: Negative for malaise, fever and chills. No significant weight loss or weight gain Eyes: Negative for eye pain, redness and discharge, diplopia, visual changes, or flashes of light. ENMT: Negative for ear pain, hoarseness, nasal congestion, sinus pressure and sore throat. No headaches; tinnitus, drooling, or problem swallowing. Cardiovascular: Negative for chest pain, palpitations, diaphoresis, dyspnea and peripheral edema. ; No orthopnea, PND Respiratory: Negative for cough, hemoptysis, wheezing and stridor. No pleuritic chestpain. Gastrointestinal: Negative for diarrhea, constipation,  melena, blood in stool, hematemesis, jaundice and rectal bleeding.    Genitourinary: Negative for frequency, dysuria,  incontinence,flank pain and hematuria; Musculoskeletal: Negative for back pain and neck pain. Negative for swelling and trauma.;  Skin: . Negative for pruritus, rash, abrasions, bruising and skin lesion.; ulcerations Neuro: Negative for headache, lightheadedness and neck stiffness. Negative for weakness, burning feet, involuntary movement, seizure and syncope.  Psych: negative for anxiety, depression, insomnia, tearfulness, panic attacks, hallucinations, paranoia, suicidal or homicidal ideation    Past Medical History:  Diagnosis Date  . Anxiety and depression   . Diabetes mellitus 06/09/2011   pt taking metformin  . DJD of shoulder    right   . Hyperlipidemia   . Hypertension   . Migraines   . Tremor   . Vitamin D deficiency     Past Surgical History:  Procedure Laterality Date  . BACK SURGERY    . KNEE SURGERY    . SHOULDER SURGERY       reports that he has never smoked. He has never used smokeless tobacco. He reports that he does not drink alcohol or use drugs.  No Known Allergies  Family History  Problem Relation Age of Onset  . Cancer Sister   . Cancer Sister   . Cancer Brother   . Migraines Neg Hx      Prior to Admission medications   Medication Sig Start Date End Date Taking? Authorizing Provider  aspirin EC 325 MG tablet Take 325 mg by mouth every evening.   Yes Historical Provider, MD  citalopram (CELEXA) 20 MG tablet Take 20 mg by mouth at bedtime.   Yes Historical Provider, MD  gabapentin (NEURONTIN) 300 MG capsule Take  600 mg by mouth 3 (three) times daily.    Yes Historical Provider, MD  HYDROcodone-acetaminophen (NORCO) 10-325 MG per tablet Take 1 tablet by mouth every 6 (six) hours as needed for moderate pain.   Yes Historical Provider, MD  lisinopril (PRINIVIL,ZESTRIL) 20 MG tablet Take 20 mg by mouth at bedtime.    Yes Historical Provider, MD  lovastatin (MEVACOR) 20 MG tablet Take 20 mg by mouth at bedtime.    Yes Historical Provider, MD  primidone  (MYSOLINE) 50 MG tablet Take 150 mg by mouth 3 (three) times daily.    Yes Historical Provider, MD  propranolol (INDERAL) 40 MG tablet Take 40 mg by mouth 2 (two) times daily.   Yes Historical Provider, MD  topiramate (TOPAMAX) 100 MG tablet Take 200 mg by mouth at bedtime.   Yes Historical Provider, MD    Physical Exam: Vitals:   11/16/16 1900 11/16/16 1944 11/16/16 2045 11/16/16 2130  BP: 144/86   164/80  Pulse: (!) 51 (!) 51 (!) 56   Resp: 14 11 16    Temp:      TempSrc:      SpO2: 95% 98% 97%   Weight:      Height:          Constitutional: NAD, calm, comfortable Vitals:   11/16/16 1900 11/16/16 1944 11/16/16 2045 11/16/16 2130  BP: 144/86   164/80  Pulse: (!) 51 (!) 51 (!) 56   Resp: 14 11 16    Temp:      TempSrc:      SpO2: 95% 98% 97%   Weight:      Height:       Eyes: PERRL, lids and conjunctivae normal ENMT: Mucous membranes are moist. Posterior pharynx clear of any exudate or lesions.Normal dentition.  Neck: normal, supple, no masses, no thyromegaly Respiratory: clear to auscultation bilaterally, no wheezing, no crackles. Normal respiratory effort. No accessory muscle use.  Cardiovascular: Regular rate and rhythm, no murmurs / rubs / gallops. No extremity edema. 2+ pedal pulses. No carotid bruits.  Abdomen: no tenderness, no masses palpated. No hepatosplenomegaly. Bowel sounds positive.  Musculoskeletal: no clubbing / cyanosis. No joint deformity upper and lower extremities. Good ROM, no contractures. Normal muscle tone.  Skin: no rashes, lesions, ulcers. No induration Neurologic: CN 2-12 grossly intact. Sensation intact, absence of DTR.Marland Kitchen Strength 5/5 in all 4.  Psychiatric: Normal judgment and insight. Alert and oriented x 3. Normal mood.     Labs on Admission: I have personally reviewed following labs and imaging studies  CBC:  Recent Labs Lab 11/16/16 1528  WBC 6.8  NEUTROABS 4.4  HGB 13.3  HCT 40.8  MCV 98.8  PLT 287   Basic Metabolic  Panel:  Recent Labs Lab 11/16/16 1528  NA 140  K 4.6  CL 108  CO2 28  GLUCOSE 121*  BUN 14  CREATININE 1.36*  CALCIUM 8.6*   GFR: Estimated Creatinine Clearance: 58.1 mL/min (by C-G formula based on SCr of 1.36 mg/dL (H)). Liver Function Tests:  Recent Labs Lab 11/16/16 1528  AST 16  ALT 14*  ALKPHOS 103  BILITOT 0.3  PROT 7.2  ALBUMIN 3.8   Coagulation Profile:  Recent Labs Lab 11/16/16 1528  INR 1.05   Urine analysis:    Component Value Date/Time   COLORURINE YELLOW 11/16/2016 [place] 11/16/2016 1514   LABSPEC 1.020 11/16/2016 1514   PHURINE 5.0 11/16/2016 1514   GLUCOSEU NEGATIVE 11/16/2016 [place] NEGATIVE 11/16/2016 1514  BILIRUBINUR NEGATIVE 11/16/2016 [place] 11/16/2016 1514   PROTEINUR NEGATIVE 11/16/2016 1514   UROBILINOGEN 1.0 11/18/2014 1238   NITRITE NEGATIVE 11/16/2016 1514   LEUKOCYTESUR NEGATIVE 11/16/2016 1514    Radiological Exams on Admission: Dg Chest 1 View  Result Date: 11/16/2016 CLINICAL DATA:  Status post fall two days ago getting out of the bathtub at home striking the side of the body on the edge of the toe. The patient reports left-sided back pain and posterior left rib discomfort. EXAM: CHEST 1 VIEW COMPARISON:  PA and lateral chest x-ray of June 09, 2011 FINDINGS: The lungs are well-expanded. There is no evidence of a pulmonary contusion, pleural effusion, or pneumothorax. The heart and pulmonary vascularity are normal. The mediastinum is normal in width. There may be a nondisplaced fracture through the posterolateral aspect of the left tenth rib. IMPRESSION: Possible nondisplaced fracture of the lateral aspect of the left tenth rib. The ribs are not completely included in the field of view. No acute cardiopulmonary abnormality. Electronically Signed   By: David  Martinique M.D.   On: 11/16/2016 16:16   Dg Thoracic Spine W/swimmers  Result Date: 11/16/2016 CLINICAL DATA:  [place] Tuesday at  home getting out of the bath tub striking side on the tub, LEFT side back pain EXAM: THORACIC SPINE - 3 VIEWS COMPARISON:  None FINDINGS: Twelve pairs of ribs. Diffuse osseous demineralization. Scattered mild vertebral endplate spur formation. Vertebral body heights maintained without fracture or subluxation. No bone destruction. Visualized posterior ribs unremarkable. 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 No acute abnormalities. Electronically Signed   By: Lavonia Dana M.D.   On: 11/16/2016 16:17   Dg Lumbar Spine Complete  Result Date: 11/16/2016 CLINICAL DATA:  [place] 2 days ago with back pain and weakness EXAM: LUMBAR SPINE - COMPLETE 4+ VIEW COMPARISON:  Lumbar spine films of 05/18/2015 FINDINGS: The lumbar vertebrae are unchanged in alignment. Intervertebral disc spaces appear normal and no compression deformity is seen. The bones are slightly osteopenic. The SI joints appear corticated. The sacral foramina are unremarkable. Moderate abdominal aortic atherosclerosis is noted. IMPRESSION: 1. Osteopenia.  No acute compression deformity. 2. Moderate abdominal aortic atherosclerosis. Electronically Signed   By: Ivar Drape M.D.   On: 11/16/2016 16:17   Ct Head Wo Contrast  Result Date: 11/16/2016 CLINICAL DATA:  71 y/o M; found lying floor. History of recent falls. EXAM: CT HEAD WITHOUT CONTRAST CT CERVICAL SPINE WITHOUT CONTRAST TECHNIQUE: Multidetector CT imaging of the head and cervical spine was performed following the standard protocol without intravenous contrast. Multiplanar CT image reconstructions of the cervical spine were also generated. COMPARISON:  09/03/2015 MRI brain.  11/30/2011 MRI cervical spine. FINDINGS: CT HEAD FINDINGS Brain: No evidence of acute infarction, hemorrhage, hydrocephalus, extra-axial collection or mass lesion/mass effect. Mild chronic microvascular ischemic changes and parenchymal volume loss of the brain.  Vascular: Calcific atherosclerosis of cavernous internal carotid arteries. Skull: Normal. Negative for fracture or focal lesion. Sinuses/Orbits: No acute finding. Other: None. CT CERVICAL SPINE FINDINGS Alignment: Straightening of cervical lordosis.  No dislocation. Skull base and vertebrae: No acute fracture. No primary bone lesion or focal pathologic process. Anterior cervical discectomy and fusion of C5 and C6. No apparent hardware related complication or periprosthetic lucency. Soft tissues and spinal canal: No prevertebral fluid or swelling. No visible canal hematoma. Disc levels: Mild cervical spondylosis with discogenic and facet arthropathy greatest from C4 through C7. No high-grade bony canal stenosis or foraminal narrowing.  Upper chest: Negative. Other: Several partially calcified nodules within the thyroid gland measuring up to 11 mm in the right lobe of thyroid. Moderate calcific atherosclerosis of the left hand mild of the right carotid bifurcations. On IMPRESSION: 1. No acute intracranial abnormality. 2. No acute fracture or dislocation of cervical spine. 3. Mild chronic microvascular ischemic changes and parenchymal volume loss of the brain. 4. Mild cervical spondylosis. 5. Left-greater-than-right calcific atherosclerosis of carotid bifurcations. 6. Thyroid nodules with coarse calcifications measuring up to 11 mm on the right. These can be further evaluated with thyroid ultrasound on a nonemergent basis. Electronically Signed   By: Kristine Garbe M.D.   On: 11/16/2016 16:33   Ct Cervical Spine Wo Contrast  Result Date: 11/16/2016 CLINICAL DATA:  71 y/o M; found lying floor. History of recent falls. EXAM: CT HEAD WITHOUT CONTRAST CT CERVICAL SPINE WITHOUT CONTRAST TECHNIQUE: Multidetector CT imaging of the head and cervical spine was performed following the standard protocol without intravenous contrast. Multiplanar CT image reconstructions of the cervical spine were also generated.  COMPARISON:  09/03/2015 MRI brain.  11/30/2011 MRI cervical spine. FINDINGS: CT HEAD FINDINGS Brain: No evidence of acute infarction, hemorrhage, hydrocephalus, extra-axial collection or mass lesion/mass effect. Mild chronic microvascular ischemic changes and parenchymal volume loss of the brain. Vascular: Calcific atherosclerosis of cavernous internal carotid arteries. Skull: Normal. Negative for fracture or focal lesion. Sinuses/Orbits: No acute finding. Other: None. CT CERVICAL SPINE FINDINGS Alignment: Straightening of cervical lordosis.  No dislocation. Skull base and vertebrae: No acute fracture. No primary bone lesion or focal pathologic process. Anterior cervical discectomy and fusion of C5 and C6. No apparent hardware related complication or periprosthetic lucency. Soft tissues and spinal canal: No prevertebral fluid or swelling. No visible canal hematoma. Disc levels: Mild cervical spondylosis with discogenic and facet arthropathy greatest from C4 through C7. No high-grade bony canal stenosis or foraminal narrowing. Upper chest: Negative. Other: Several partially calcified nodules within the thyroid gland measuring up to 11 mm in the right lobe of thyroid. Moderate calcific atherosclerosis of the left hand mild of the right carotid bifurcations. On IMPRESSION: 1. No acute intracranial abnormality. 2. No acute fracture or dislocation of cervical spine. 3. Mild chronic microvascular ischemic changes and parenchymal volume loss of the brain. 4. Mild cervical spondylosis. 5. Left-greater-than-right calcific atherosclerosis of carotid bifurcations. 6. Thyroid nodules with coarse calcifications measuring up to 11 mm on the right. These can be further evaluated with thyroid ultrasound on a nonemergent basis. Electronically Signed   By: Kristine Garbe M.D.   On: 11/16/2016 16:33    EKG: Independently reviewed.   Assessment/Plan Active Problems:   Daytime somnolence   Essential tremor    Intractable headache   Weakness generalized   Altered mental status, unspecified   Fracture, rib   Weakness    PLAN:   Weakness:  Diffuse, but more lower extremity than upper.  Unclear exact etiology but differential is great.  Possible building up of sedatives medication.  He could have taken more narcotics after broken ribs, though son didn't think that was the case.  Guillian Bare Syndrome is a possibility and therefore, he may need LP tomorrow. Will therefore hold subQ DVT prophylaxis.  Will admit him to SDU for closer watching of his respiratory status.  Obtain B12, RPR, HIV, CPK, ESR.  He could have had a CVA today also. Will obtain MRI of the brain.  Due to his weakness of the lower extremities, will obtain MRI of the thoracic and  lumbar spine.  He has symptoms suggestive of seizure with the "grunt", stiffness, and lethargic subsequently, so will obtain EEG. Will need neurology consultation and I have placed a request for Dr Merlene Laughter to see him.  In the interim, will lower his Neurontin, d/c his narcotics, and consider tapering his Topamax as well. Given he has been on statin, will hold and check CPK.  Adult Still Syndrome is possible as well.    I have explained the difficulty in obtaining a firm diagnosis to his son and daughter in laws, as patient cannot give concise history. They expressed understanding and agreement.   Fx rib:  Hold narcotics for now during evaluation.  Depression:  Could be contributing to his symptoms.  Continue SSRI.     Poor memory:  It is possible he has undiagnosed underlying dementia as well.    DVT prophylaxis: SCD. Code Status: FULL CODE.  Family Communication: son and daughter in laws at bedside.  Disposition Plan: Home when better. Consults called: None.  Admission status: Inpatient, due to the complexity of the symptoms, potential life threatening diagnosis, and a significant change in his baseline status, I suspect he will require more than 2 midnights  of evaluation and monitoring his status.    Johnross Nabozny MD FACP. [facility]  If 7PM-7AM, please contact night-coverage [email or web address] Password [facility]  11/16/2016, 10:13 PM

## 2016-11-16 NOTE — ED Notes (Signed)
Pt missed urinal and urinated in bed.  Complete linen change done

## 2016-11-16 NOTE — ED Notes (Signed)
Attempted to get pt up to ambulate. Pt was weak and seemed to not be able to keep his eyes open. Pt able to speak few words when spoken to, but seems to "fall back to sleep" after.Tried to help pt sit on the side of the bed and pt was unable to lift off the bed.

## 2016-11-17 ENCOUNTER — Inpatient Hospital Stay (HOSPITAL_COMMUNITY)
Admit: 2016-11-17 | Discharge: 2016-11-17 | Disposition: A | Discharge status: Home / Self Care | Payer: Medicare Other | Attending: Internal Medicine | Admitting: Internal Medicine

## 2016-11-17 ENCOUNTER — Inpatient Hospital Stay (HOSPITAL_COMMUNITY): Payer: Medicare Other

## 2016-11-17 DIAGNOSIS — R400 Somnolence: Secondary | ICD-10-CM

## 2016-11-17 DIAGNOSIS — G250 Essential tremor: Secondary | ICD-10-CM

## 2016-11-17 LAB — COMPREHENSIVE METABOLIC PANEL
ALT: 13 U/L — ABNORMAL LOW (ref 17–63)
AST: 15 U/L (ref 15–41)
Albumin: 3.6 g/dL (ref 3.5–5.0)
Alkaline Phosphatase: 96 U/L (ref 38–126)
Anion gap: 6 (ref 5–15)
BUN: 12 mg/dL (ref 6–20)
CO2: 25 mmol/L (ref 22–32)
Calcium: 8.5 mg/dL — ABNORMAL LOW (ref 8.9–10.3)
Chloride: 107 mmol/L (ref 101–111)
Creatinine, Ser: 1.07 mg/dL (ref 0.61–1.24)
GFR calc Af Amer: >60 mL/min (ref >60)
GFR calc non Af Amer: >60 mL/min (ref >60)
Glucose, Bld: 112 mg/dL — ABNORMAL HIGH (ref 65–99)
Potassium: 3.7 mmol/L (ref 3.5–5.1)
Sodium: 138 mmol/L (ref 135–145)
Total Bilirubin: 0.3 mg/dL (ref 0.3–1.2)
Total Protein: 6.8 g/dL (ref 6.5–8.1)

## 2016-11-17 LAB — CBC
HCT: 39.8 % (ref 39.0–52.0)
Hemoglobin: 12.6 g/dL — ABNORMAL LOW (ref 13.0–17.0)
MCH: 31.2 pg (ref 26.0–34.0)
MCHC: 31.7 g/dL (ref 30.0–36.0)
MCV: 98.5 fL (ref 78.0–100.0)
Platelets: 192 10*3/uL (ref 150–400)
RBC: 4.04 MIL/uL — ABNORMAL LOW (ref 4.22–5.81)
RDW: 12.1 % (ref 11.5–15.5)
WBC: 6.9 10*3/uL (ref 4.0–10.5)

## 2016-11-17 LAB — GLUCOSE, CAPILLARY
Glucose-Capillary: 101 mg/dL — ABNORMAL HIGH (ref 65–99)
Glucose-Capillary: 122 mg/dL — ABNORMAL HIGH (ref 65–99)
Glucose-Capillary: 156 mg/dL — ABNORMAL HIGH (ref 65–99)
Glucose-Capillary: 73 mg/dL (ref 65–99)

## 2016-11-17 LAB — TSH: TSH: 4.093 u[IU]/mL (ref 0.350–4.500)

## 2016-11-17 LAB — AMMONIA: Ammonia: 16 umol/L (ref 9–35)

## 2016-11-17 LAB — PHENYTOIN LEVEL, TOTAL: Phenytoin Lvl: <2.5 ug/mL — ABNORMAL LOW (ref 10.0–20.0)

## 2016-11-17 LAB — SEDIMENTATION RATE: Sed Rate: 17 mm/hr — ABNORMAL HIGH (ref 0–16)

## 2016-11-17 LAB — MRSA PCR SCREENING: MRSA by PCR: NEGATIVE

## 2016-11-17 LAB — VITAMIN B12: Vitamin B-12: 176 pg/mL — ABNORMAL LOW (ref 180–914)

## 2016-11-17 MED ORDER — DEXTROSE-NACL 5-0.9 % IV SOLN
INTRAVENOUS | Status: DC
  Administered 2016-11-17 (×2): via INTRAVENOUS

## 2016-11-17 MED ORDER — DEXTROSE 50 % IV SOLN
INTRAVENOUS | Status: AC
  Administered 2016-11-17: 50 mL via INTRAVENOUS
  Filled 2016-11-17: qty 50

## 2016-11-17 MED ORDER — PRIMIDONE 50 MG PO TABS
ORAL_TABLET | ORAL | Status: AC
Start: 2016-11-17 — End: 2016-11-17
  Filled 2016-11-17: qty 2

## 2016-11-17 MED ORDER — DEXTROSE 50 % IV SOLN
1.0000 | Freq: Once | INTRAVENOUS | Status: AC
  Administered 2016-11-17: 50 mL via INTRAVENOUS

## 2016-11-17 MED ORDER — CYANOCOBALAMIN 1000 MCG/ML IJ SOLN
1000.0000 ug | Freq: Every day | INTRAMUSCULAR | Status: DC
  Administered 2016-11-17: 1000 ug via INTRAMUSCULAR
  Filled 2016-11-17: qty 1

## 2016-11-17 MED ORDER — INSULIN ASPART 100 UNIT/ML ~~LOC~~ SOLN: 0.0000 [IU] | Freq: Every day | SUBCUTANEOUS | Status: DC

## 2016-11-17 MED ORDER — INSULIN ASPART 100 UNIT/ML ~~LOC~~ SOLN: 0.0000 [IU] | Freq: Three times a day (TID) | SUBCUTANEOUS | Status: DC

## 2016-11-17 MED ORDER — SODIUM CHLORIDE 0.9 % IV SOLN
INTRAVENOUS | Status: DC
  Administered 2016-11-17: 17:00:00 via INTRAVENOUS

## 2016-11-17 MED ORDER — INSULIN ASPART 100 UNIT/ML ~~LOC~~ SOLN: 3.0000 [IU] | Freq: Three times a day (TID) | SUBCUTANEOUS | Status: DC

## 2016-11-17 NOTE — Plan of Care (Signed)
Problem: Safety: Goal: Ability to remain free from injury will improve Outcome: Progressing Pt is now alert and oriented; understands to use call light and calls for assistance when needed.   Problem: Activity: Goal: Risk for activity intolerance will decrease Outcome: Not Progressing Pt c/o of pain in L side d/t broken ribs. Pt very reluctant to move at all.

## 2016-11-17 NOTE — Procedures (Signed)
  [place] A. Merlene Laughter, MD     [email or web address]           HISTORY: The patient is a 71 year old man who presents with altered mental status. This does been done to evaluate for possible seizure of the confusion.  MEDICATIONS: Scheduled Meds: . aspirin EC  325 mg Oral QPM  . citalopram  20 mg Oral QHS  . cyanocobalamin  1,000 mcg Intramuscular Q2200  . gabapentin  300 mg Oral TID  . insulin aspart  0-5 Units Subcutaneous QHS  . insulin aspart  0-9 Units Subcutaneous TID WC  . insulin aspart  3 Units Subcutaneous TID WC  . lisinopril  20 mg Oral QHS  . primidone  75 mg Oral Q8H  . propranolol  40 mg Oral BID  . sodium chloride flush  3 mL Intravenous Q12H  . topiramate  200 mg Oral QHS   Continuous Infusions: . sodium chloride 75 mL/hr at 11/17/16 1700   PRN Meds:.  Prior to Admission medications   Medication Sig Start Date End Date Taking? Authorizing Provider  aspirin EC 325 MG tablet Take 325 mg by mouth every evening.   Yes Historical Provider, MD  citalopram (CELEXA) 20 MG tablet Take 20 mg by mouth at bedtime.   Yes Historical Provider, MD  gabapentin (NEURONTIN) 300 MG capsule Take 600 mg by mouth 3 (three) times daily.    Yes Historical Provider, MD  HYDROcodone-acetaminophen (NORCO) 10-325 MG per tablet Take 1 tablet by mouth every 6 (six) hours as needed for moderate pain.   Yes Historical Provider, MD  lisinopril (PRINIVIL,ZESTRIL) 20 MG tablet Take 20 mg by mouth at bedtime.    Yes Historical Provider, MD  lovastatin (MEVACOR) 20 MG tablet Take 20 mg by mouth at bedtime.    Yes Historical Provider, MD  primidone (MYSOLINE) 50 MG tablet Take 150 mg by mouth 3 (three) times daily.    Yes Historical Provider, MD  propranolol (INDERAL) 40 MG tablet Take 40 mg by mouth 2 (two) times daily.   Yes Historical Provider, MD  topiramate (TOPAMAX) 100 MG tablet Take 200 mg by mouth at bedtime.   Yes Historical Provider, MD      ANALYSIS: A 16 channel  recording using standard 10 20 measurements is conducted for 21 minutes. The background activity gets as high as 8 Hz. There is beta activity observed in the frontal areas. Awake and drowsy activities are observed. Photic stimulation and hyperventilation are not conducted. There is no focal or lateralized slowing. There is no epileptiform activity is observed. The patient is noted to have multiple episodes of frontal intermittent rhythmic delta activity.   IMPRESSION: This recording is abnormal for the following reasons: 1. Mild global slowing indicating a mild global encephalopathy. 2. Multiple episodes of frontal intermittent rhythmic rhythmic delta activity. This is most commonly seen in toxic metabolic encephalopathy.        Djeneba Barsch A. Merlene Laughter, M.D.  Diplomate, Tax adviser of Psychiatry and Neurology ( Neurology).

## 2016-11-17 NOTE — Progress Notes (Signed)
PROGRESS NOTE    Daniel Richards  [id number] DOB: November 30, 1945 DOA: 11/16/2016 PCP: Octavio Graves, DO     Brief Narrative:  71 year old man admitted to the hospital on 1/11 with complaints of at least a 2 week history of bilateral lower extremity greater than upper extremity weakness and falls.   Assessment & Plan:   Active Problems:   Daytime somnolence   Essential tremor   Intractable headache   Weakness generalized   Altered mental status, unspecified   Fracture, rib   Weakness   Extremity weakness, daytime somnolent, blank stare -Etiology unclear, high suspicion for partial seizures given son's description of patient having normal conversation and then suddenly staring off blankly without interaction for a few minutes, this is sometimes accompanied by right arm shaking although this is sometimes difficult to evaluate as he does have a history of essential tremors. -Had brain, thoracic and lumbar spine MRIs. Only abnormality was at the L5-S1 level with a moderate sized right paracentral disc protrusion with mass effect on the right infra spinal S1 nerve root. Unclear what if any bearing this has on patient's current presentation. -EEG has been ordered, neurology consultation requested. -Doubt Guillain-Barr, patient has been monitored overnight in the ICU and has not had any respiratory issues, will proceed with transfer to the floor today. -He has been taking increased hydrocodone he states up to 6 pills a day ever since he suffered a fractured rib after a fall, this may be playing a role in his symptoms as well although the falls started long before the rib fracture.   DVT prophylaxis: SCDs Code Status: Full code Family Communication: Son at bedside updated on plan of care and questions answered Disposition Plan: Transfer to medical bed  Consultants:   Neurology  Procedures:   None  Antimicrobials:  Anti-infectives    None       Subjective: Still feels  weak  Objective: Vitals:   11/17/16 0800 11/17/16 0900 11/17/16 1000 11/17/16 1100  BP:  (!) 172/62 (!) 163/71 (!) 167/84  Pulse: (!) 53 (!) 52 (!) 54 (!) 51  Resp: 13 11 11 13   Temp: 98.2 F (36.8 C)     TempSrc: Oral     SpO2: 99% 100% 95% 96%  Weight:      Height:        Intake/Output Summary (Last 24 hours) at 11/17/16 1755 Last data filed at 11/17/16 1700  Gross per 24 hour  Intake          1206.25 ml  Output                0 ml  Net          1206.25 ml   Filed Weights   11/16/16 1458 11/17/16 0000  Weight: 96.2 kg (212 lb) 93.9 kg (207 lb 0.2 oz)    Examination:   General exam: Alert, awake, oriented x 3 Respiratory system: Clear to auscultation. Respiratory effort normal. Cardiovascular system:RRR. No murmurs, rubs, gallops. Gastrointestinal system: Abdomen is nondistended, soft and nontender. No organomegaly or masses felt. Normal bowel sounds heard. Central nervous system: Alert and oriented. No focal neurological deficits. Extremities: No C/C/E, +pedal pulses Skin: No rashes, lesions or ulcers Psychiatry: Judgement and insight appear normal. Mood & affect appropriate.     Data Reviewed: I have personally reviewed following labs and imaging studies  CBC:  Recent Labs Lab 11/16/16 1528 11/17/16 0530  WBC 6.8 6.9  NEUTROABS 4.4  --   HGB 13.3  12.6*  HCT 40.8 39.8  MCV 98.8 98.5  PLT 223 [id number]   Basic Metabolic Panel:  Recent Labs Lab 11/16/16 1528 11/17/16 0530  NA 140 138  K 4.6 3.7  CL 108 107  CO2 28 25  GLUCOSE 121* 112*  BUN 14 12  CREATININE 1.36* 1.07  CALCIUM 8.6* 8.5*   GFR: Estimated Creatinine Clearance: 74.2 mL/min (by C-G formula based on SCr of 1.07 mg/dL). Liver Function Tests:  Recent Labs Lab 11/16/16 1528 11/17/16 0530  AST 16 15  ALT 14* 13*  ALKPHOS 103 96  BILITOT 0.3 0.3  PROT 7.2 6.8  ALBUMIN 3.8 3.6   No results for input(s): LIPASE, AMYLASE in the last 168 hours.  Recent Labs Lab 11/17/16 0530   AMMONIA 16   Coagulation Profile:  Recent Labs Lab 11/16/16 1528  INR 1.05   Cardiac Enzymes: No results for input(s): CKTOTAL, CKMB, CKMBINDEX, TROPONINI in the last 168 hours. BNP (last 3 results) No results for input(s): PROBNP in the last 8760 hours. HbA1C: No results for input(s): HGBA1C in the last 72 hours. CBG:  Recent Labs Lab 11/17/16 0046 11/17/16 0130 11/17/16 1600  GLUCAP 73 156* 101*   Lipid Profile: No results for input(s): CHOL, HDL, LDLCALC, TRIG, CHOLHDL, LDLDIRECT in the last 72 hours. Thyroid Function Tests:  Recent Labs  11/16/16 0000  TSH 4.093   Anemia Panel:  Recent Labs  11/16/16 1528  VITAMINB12 176*   Urine analysis:    Component Value Date/Time   COLORURINE YELLOW 11/16/2016 [place] 11/16/2016 1514   LABSPEC 1.020 11/16/2016 1514   PHURINE 5.0 11/16/2016 1514   GLUCOSEU NEGATIVE 11/16/2016 1514   HGBUR NEGATIVE 11/16/2016 1514   BILIRUBINUR NEGATIVE 11/16/2016 1514   KETONESUR NEGATIVE 11/16/2016 1514   PROTEINUR NEGATIVE 11/16/2016 1514   UROBILINOGEN 1.0 11/18/2014 1238   NITRITE NEGATIVE 11/16/2016 1514   LEUKOCYTESUR NEGATIVE 11/16/2016 1514   Sepsis Labs: @LABRCNTIP (procalcitonin:4,lacticidven:4)  ) Recent Results (from the past 240 hour(s))  MRSA PCR Screening     Status: None   Collection Time: 11/16/16 11:50 PM  Result Value Ref Range Status   MRSA by PCR NEGATIVE NEGATIVE Final    Comment:        The GeneXpert MRSA Assay (FDA approved for NASAL specimens only), is one component of a comprehensive MRSA colonization surveillance program. It is not intended to diagnose MRSA infection nor to guide or monitor treatment for MRSA infections.          Radiology Studies: Dg Chest 1 View  Result Date: 11/16/2016 CLINICAL DATA:  Status post fall two days ago getting out of the bathtub at home striking the side of the body on the edge of the toe. The patient reports left-sided back pain  and posterior left rib discomfort. EXAM: CHEST 1 VIEW COMPARISON:  PA and lateral chest x-ray of June 09, 2011 FINDINGS: The lungs are well-expanded. There is no evidence of a pulmonary contusion, pleural effusion, or pneumothorax. The heart and pulmonary vascularity are normal. The mediastinum is normal in width. There may be a nondisplaced fracture through the posterolateral aspect of the left tenth rib. IMPRESSION: Possible nondisplaced fracture of the lateral aspect of the left tenth rib. The ribs are not completely included in the field of view. No acute cardiopulmonary abnormality. Electronically Signed   By: David  Martinique M.D.   On: 11/16/2016 16:16   Dg Thoracic Spine W/swimmers  Result Date: 11/16/2016 CLINICAL DATA:  [place] Tuesday at  home getting out of the bath tub striking side on the tub, LEFT side back pain EXAM: THORACIC SPINE - 3 VIEWS COMPARISON:  None FINDINGS: Twelve pairs of ribs. Diffuse osseous demineralization. Scattered mild vertebral endplate spur formation. Vertebral body heights maintained without fracture or subluxation. No bone destruction. Visualized posterior ribs unremarkable. 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 No acute abnormalities. Electronically Signed   By: Lavonia Dana M.D.   On: 11/16/2016 16:17   Dg Lumbar Spine Complete  Result Date: 11/16/2016 CLINICAL DATA:  [place] 2 days ago with back pain and weakness EXAM: LUMBAR SPINE - COMPLETE 4+ VIEW COMPARISON:  Lumbar spine films of 05/18/2015 FINDINGS: The lumbar vertebrae are unchanged in alignment. Intervertebral disc spaces appear normal and no compression deformity is seen. The bones are slightly osteopenic. The SI joints appear corticated. The sacral foramina are unremarkable. Moderate abdominal aortic atherosclerosis is noted. IMPRESSION: 1. Osteopenia.  No acute compression deformity. 2. Moderate abdominal aortic atherosclerosis. Electronically Signed    By: Ivar Drape M.D.   On: 11/16/2016 16:17   Ct Head Wo Contrast  Result Date: 11/16/2016 CLINICAL DATA:  71 y/o M; found lying floor. History of recent falls. EXAM: CT HEAD WITHOUT CONTRAST CT CERVICAL SPINE WITHOUT CONTRAST TECHNIQUE: Multidetector CT imaging of the head and cervical spine was performed following the standard protocol without intravenous contrast. Multiplanar CT image reconstructions of the cervical spine were also generated. COMPARISON:  09/03/2015 MRI brain.  11/30/2011 MRI cervical spine. FINDINGS: CT HEAD FINDINGS Brain: No evidence of acute infarction, hemorrhage, hydrocephalus, extra-axial collection or mass lesion/mass effect. Mild chronic microvascular ischemic changes and parenchymal volume loss of the brain. Vascular: Calcific atherosclerosis of cavernous internal carotid arteries. Skull: Normal. Negative for fracture or focal lesion. Sinuses/Orbits: No acute finding. Other: None. CT CERVICAL SPINE FINDINGS Alignment: Straightening of cervical lordosis.  No dislocation. Skull base and vertebrae: No acute fracture. No primary bone lesion or focal pathologic process. Anterior cervical discectomy and fusion of C5 and C6. No apparent hardware related complication or periprosthetic lucency. Soft tissues and spinal canal: No prevertebral fluid or swelling. No visible canal hematoma. Disc levels: Mild cervical spondylosis with discogenic and facet arthropathy greatest from C4 through C7. No high-grade bony canal stenosis or foraminal narrowing. Upper chest: Negative. Other: Several partially calcified nodules within the thyroid gland measuring up to 11 mm in the right lobe of thyroid. Moderate calcific atherosclerosis of the left hand mild of the right carotid bifurcations. On IMPRESSION: 1. No acute intracranial abnormality. 2. No acute fracture or dislocation of cervical spine. 3. Mild chronic microvascular ischemic changes and parenchymal volume loss of the brain. 4. Mild cervical  spondylosis. 5. Left-greater-than-right calcific atherosclerosis of carotid bifurcations. 6. Thyroid nodules with coarse calcifications measuring up to 11 mm on the right. These can be further evaluated with thyroid ultrasound on a nonemergent basis. Electronically Signed   By: Kristine Garbe M.D.   On: 11/16/2016 16:33   Ct Cervical Spine Wo Contrast  Result Date: 11/16/2016 CLINICAL DATA:  71 y/o M; found lying floor. History of recent falls. EXAM: CT HEAD WITHOUT CONTRAST CT CERVICAL SPINE WITHOUT CONTRAST TECHNIQUE: Multidetector CT imaging of the head and cervical spine was performed following the standard protocol without intravenous contrast. Multiplanar CT image reconstructions of the cervical spine were also generated. COMPARISON:  09/03/2015 MRI brain.  11/30/2011 MRI cervical spine. FINDINGS: CT HEAD FINDINGS Brain: No evidence of acute infarction, hemorrhage, hydrocephalus, extra-axial collection or mass  lesion/mass effect. Mild chronic microvascular ischemic changes and parenchymal volume loss of the brain. Vascular: Calcific atherosclerosis of cavernous internal carotid arteries. Skull: Normal. Negative for fracture or focal lesion. Sinuses/Orbits: No acute finding. Other: None. CT CERVICAL SPINE FINDINGS Alignment: Straightening of cervical lordosis.  No dislocation. Skull base and vertebrae: No acute fracture. No primary bone lesion or focal pathologic process. Anterior cervical discectomy and fusion of C5 and C6. No apparent hardware related complication or periprosthetic lucency. Soft tissues and spinal canal: No prevertebral fluid or swelling. No visible canal hematoma. Disc levels: Mild cervical spondylosis with discogenic and facet arthropathy greatest from C4 through C7. No high-grade bony canal stenosis or foraminal narrowing. Upper chest: Negative. Other: Several partially calcified nodules within the thyroid gland measuring up to 11 mm in the right lobe of thyroid. Moderate  calcific atherosclerosis of the left hand mild of the right carotid bifurcations. On IMPRESSION: 1. No acute intracranial abnormality. 2. No acute fracture or dislocation of cervical spine. 3. Mild chronic microvascular ischemic changes and parenchymal volume loss of the brain. 4. Mild cervical spondylosis. 5. Left-greater-than-right calcific atherosclerosis of carotid bifurcations. 6. Thyroid nodules with coarse calcifications measuring up to 11 mm on the right. These can be further evaluated with thyroid ultrasound on a nonemergent basis. Electronically Signed   By: Kristine Garbe M.D.   On: 11/16/2016 16:33   Mr Brain Wo Contrast  Result Date: 11/17/2016 CLINICAL DATA:  Patient found down.  Altered mental status. EXAM: MRI HEAD WITHOUT CONTRAST TECHNIQUE: Multiplanar, multiecho pulse sequences of the brain and surrounding structures were obtained without intravenous contrast. COMPARISON:  CT head 11/16/2016.  MR head 09/03/2015. FINDINGS: Brain: Only axial diffusion and sagittal T1 weighted imaging could be obtained. No acute stroke or mass lesion. No restricted diffusion in the cortex or basal ganglia to suggest hypoxic ischemic injury. No hydrocephalus or visible extra-axial fluid. No midline abnormality. IMPRESSION: Prematurely truncated exam demonstrating no acute stroke or visible signs of hypoxic ischemic insult. Electronically Signed   By: Staci Righter M.D.   On: 11/17/2016 13:08   Mr Thoracic Spine Wo Contrast  Result Date: 11/17/2016 CLINICAL DATA:  Son called 911 because he came to pt's house and found pt lying in the floor yesterday. Son reports pt sometimes takes more pain medication than is prescribed. Pt fell within the last week. EXAM: MRI THORACIC AND LUMBAR SPINE WITHOUT CONTRAST TECHNIQUE: Multiplanar and multiecho pulse sequences of the thoracic and lumbar spine were obtained without intravenous contrast. COMPARISON:  None. FINDINGS: MRI THORACIC SPINE FINDINGS Alignment:   Physiologic. Vertebrae: No fracture, evidence of discitis, or bone lesion. Cord:  Normal signal and morphology. Paraspinal and other soft tissues: Negative. Disc levels: Disc spaces: Disc spaces are maintained. Anterior cervical fusion at C5-6. C7-T1: Mild broad-based disc bulge. No foraminal or central canal stenosis. T1-T2: No disc protrusion, foraminal stenosis or central canal stenosis. T2-T3: No disc protrusion, foraminal stenosis or central canal stenosis. T3-T4: No disc protrusion, foraminal stenosis or central canal stenosis. T4-T5: No disc protrusion, foraminal stenosis or central canal stenosis. T5-T6: No disc protrusion, foraminal stenosis or central canal stenosis. T6-T7: No disc protrusion, foraminal stenosis or central canal stenosis. T7-T8: No disc protrusion, foraminal stenosis or central canal stenosis. T8-T9: No disc protrusion, foraminal stenosis or central canal stenosis. T9-T10: No disc protrusion, foraminal stenosis or central canal stenosis. T10-T11: No disc protrusion, foraminal stenosis or central canal stenosis. T11-T12: No disc protrusion, foraminal stenosis or central canal stenosis. MRI LUMBAR SPINE FINDINGS Segmentation:  Standard. Alignment:  Physiologic. Vertebrae:  No fracture, evidence of discitis, or bone lesion. Conus medullaris: Extends to the L1 level and appears normal. Paraspinal and other soft tissues: No focal paraspinal abnormality. Disc levels: Disc spaces: Degenerative disc disease with disc desiccation throughout the lumbar spine. Mild disc height loss at L1-2 and L2-3. T12-L1: No significant disc bulge. No evidence of neural foraminal stenosis. No central canal stenosis. L1-L2: No significant disc bulge. No evidence of neural foraminal stenosis. No central canal stenosis. L2-L3: Mild broad-based disc bulge. No evidence of neural foraminal stenosis. No central canal stenosis. L3-L4: No significant disc bulge. No evidence of neural foraminal stenosis. No central canal  stenosis. L4-L5: Mild broad-based disc bulge. No evidence of neural foraminal stenosis. No central canal stenosis. L5-S1: Moderate-sized right paracentral disc protrusion versus scarring with mass effect on the right intraspinal S1 nerve root. Moderate bilateral facet arthropathy. Right L5 laminectomy defect. Mild right foraminal stenosis. No left foraminal stenosis. No central canal stenosis. IMPRESSION: MR THORACIC SPINE IMPRESSION 1. No acute injury of the thoracic spine. 2. No significant thoracic spine disc protrusion, foraminal stenosis or central canal stenosis. MR LUMBAR SPINE IMPRESSION 1. At L5-S1 there is a moderate-sized right paracentral disc protrusion versus scarring with mass effect on the right intraspinal S1 nerve root. Moderate bilateral facet arthropathy. Right L5 laminectomy defect. Mild right foraminal stenosis. Electronically Signed   By: Kathreen Devoid   On: 11/17/2016 13:00   Mr Lumbar Spine Wo Contrast  Result Date: 11/17/2016 CLINICAL DATA:  Son called 911 because he came to pt's house and found pt lying in the floor yesterday. Son reports pt sometimes takes more pain medication than is prescribed. Pt fell within the last week. EXAM: MRI THORACIC AND LUMBAR SPINE WITHOUT CONTRAST TECHNIQUE: Multiplanar and multiecho pulse sequences of the thoracic and lumbar spine were obtained without intravenous contrast. COMPARISON:  None. FINDINGS: MRI THORACIC SPINE FINDINGS Alignment:  Physiologic. Vertebrae: No fracture, evidence of discitis, or bone lesion. Cord:  Normal signal and morphology. Paraspinal and other soft tissues: Negative. Disc levels: Disc spaces: Disc spaces are maintained. Anterior cervical fusion at C5-6. C7-T1: Mild broad-based disc bulge. No foraminal or central canal stenosis. T1-T2: No disc protrusion, foraminal stenosis or central canal stenosis. T2-T3: No disc protrusion, foraminal stenosis or central canal stenosis. T3-T4: No disc protrusion, foraminal stenosis or  central canal stenosis. T4-T5: No disc protrusion, foraminal stenosis or central canal stenosis. T5-T6: No disc protrusion, foraminal stenosis or central canal stenosis. T6-T7: No disc protrusion, foraminal stenosis or central canal stenosis. T7-T8: No disc protrusion, foraminal stenosis or central canal stenosis. T8-T9: No disc protrusion, foraminal stenosis or central canal stenosis. T9-T10: No disc protrusion, foraminal stenosis or central canal stenosis. T10-T11: No disc protrusion, foraminal stenosis or central canal stenosis. T11-T12: No disc protrusion, foraminal stenosis or central canal stenosis. MRI LUMBAR SPINE FINDINGS Segmentation:  Standard. Alignment:  Physiologic. Vertebrae:  No fracture, evidence of discitis, or bone lesion. Conus medullaris: Extends to the L1 level and appears normal. Paraspinal and other soft tissues: No focal paraspinal abnormality. Disc levels: Disc spaces: Degenerative disc disease with disc desiccation throughout the lumbar spine. Mild disc height loss at L1-2 and L2-3. T12-L1: No significant disc bulge. No evidence of neural foraminal stenosis. No central canal stenosis. L1-L2: No significant disc bulge. No evidence of neural foraminal stenosis. No central canal stenosis. L2-L3: Mild broad-based disc bulge. No evidence of neural foraminal stenosis. No central canal stenosis. L3-L4: No significant disc bulge. No evidence  of neural foraminal stenosis. No central canal stenosis. L4-L5: Mild broad-based disc bulge. No evidence of neural foraminal stenosis. No central canal stenosis. L5-S1: Moderate-sized right paracentral disc protrusion versus scarring with mass effect on the right intraspinal S1 nerve root. Moderate bilateral facet arthropathy. Right L5 laminectomy defect. Mild right foraminal stenosis. No left foraminal stenosis. No central canal stenosis. IMPRESSION: MR THORACIC SPINE IMPRESSION 1. No acute injury of the thoracic spine. 2. No significant thoracic spine disc  protrusion, foraminal stenosis or central canal stenosis. MR LUMBAR SPINE IMPRESSION 1. At L5-S1 there is a moderate-sized right paracentral disc protrusion versus scarring with mass effect on the right intraspinal S1 nerve root. Moderate bilateral facet arthropathy. Right L5 laminectomy defect. Mild right foraminal stenosis. Electronically Signed   By: Kathreen Devoid   On: 11/17/2016 13:00        Scheduled Meds: . aspirin EC  325 mg Oral QPM  . citalopram  20 mg Oral QHS  . gabapentin  300 mg Oral TID  . insulin aspart  0-5 Units Subcutaneous QHS  . insulin aspart  0-9 Units Subcutaneous TID WC  . insulin aspart  3 Units Subcutaneous TID WC  . lisinopril  20 mg Oral QHS  . primidone  75 mg Oral Q8H  . propranolol  40 mg Oral BID  . sodium chloride flush  3 mL Intravenous Q12H  . topiramate  200 mg Oral QHS   Continuous Infusions: . sodium chloride 75 mL/hr at 11/17/16 1700     LOS: 1 day    Time spent: 25 minutes. Greater than 50% of this time was spent in direct contact with the patient coordinating care.     Lelon Frohlich, MD [facility] Pager [phone]  If 7PM-7AM, please contact night-coverage [email or web address] Password [email or web address] 11/17/2016, 5:55 PM

## 2016-11-17 NOTE — Consult Note (Signed)
Daniel A. Merlene Laughter, MD     [email or web address]          Daniel Richards is an 71 y.o. male.   ASSESSMENT/PLAN: The patient's episode - semiology is unclear. No structural abnormality has been uncovered. The overall picture seems to point to a metabolic process with cognitive impairment/altered mental status, gait impairment and EEG showing evidence of global slowing and characteristic findings suggestive of toxic metabolic encephalopathies. However, no metabolic disturbance has been uncovered.  This leaves toxicity from multiple psychotropic medications as the most likely etiology. I'm particular concern about mysoline which is being prescribed around a high dose. Certainly, he has been on these medications for a long time but he is advancing IN age and his creatinine clearance is likely reducing. Additionally, I suspect that he took more of his pain medication recently after his most recent fall and injury to his chest. The neurologic examination does not suggest a primary neurological problem such as myopathy, myelopathy, acute or chronic neuropathy or parkinsonian syndromes.  Vitamin B12 deficiency.  Essential tremor.  Chronic pain syndrome.  RECOMMENDATION: I will check Mysoline and phenobarbital levels. It appears that the Mysoline has been reduced with the think is appropriate. We should also try to minimize other psychotropic medications. It appears that Neurontin is also been reduced. I would also suggest using pain medications sparingly.  Orthostatics.  Physical and occupational therapies. The patient vitamin B12 will be replaced.       The patient is 71 year old white male who has significant comorbidities at baseline. The patient fell 2 days ago in the shower and sustained significant injuries to his chest and fractured his left great toe. He did see his primary care provider who examined him. He is on multiple psychotropic medications at baseline for  various reasons including chronic pain and essential tremor. The patient reports that he has been on these medications for a long time. This is corroborated by his son. It is unclear if he took more of his medications. The patient was sleeping when he woke up about 1 PM. This is the time he usually wakes up. He sleeps on the couch and decided to get up to go to the bathroom but became weak and fell to the ground. It appears he was confused disoriented and has significant amnesia to what happened. He was found by son after he fell and hit his head. The patient has no recollection of what happened to him for several hours. He has no recollection of going to the emergency room and ambulance. The son said he was having spells of staring and unresponsiveness. The patient himself reports that he seemed to have episodes of vivid imagery when he closed his eyes. The patient denies loss of consciousness. There are no reports of focal numbness, weakness, dysarthria or dysphagia. No chest pain or shortness of breath. There are no reports of convulsions. The patient remained this way for several hours while he was being evaluated in the emergency room and a while he was admitted. The patient is gradually returned to baseline appears. He complains of significant pain involving the left great toe and chest. He has chronic back pain and has undergone low back surgery and also cervical discectomy and fusion. He reports increase in pain recently in the low back region bilaterally. He has a long-standing history of essential tremor. It appears that he has most been treated by his primary care provider. He tells me that he did see a neurologist wants  to advise him to continue with his current medications. It is a strong family history of essential tremor with multiple members having this condition.  GENERAL: He does appears to be in some moderate discomfort due to pain.  HEENT: Supple. Atraumatic normocephalic.   ABDOMEN:  soft  EXTREMITIES: No edema. The left great toe is black and blue and significantly sore. The patient overall has significant pain of the legs bilaterally. Marked arthritic changes of the knees bilaterally.  BACK: Normal.  SKIN: Normal by inspection.    MENTAL STATUS: Alert and oriented. Speech, language and cognition are generally intact. Judgment and insight normal.   CRANIAL NERVES: Pupils are R - 3 mm and L 4 mm, round and reactive to light and accommodation; extra ocular movements are full, there is no significant nystagmus; visual fields are full; upper and lower facial muscles are normal in strength and symmetric, there is no flattening of the nasolabial folds; tongue is midline; uvula is midline; shoulder elevation is normal.  MOTOR: Normal tone, bulk and strength - arms; no pronator drift. Right leg 5/5. Left leg dorsiflexion 4/5 and plantar flexion 1/5 mostly due to severe pain of the foot.  COORDINATION: The patient has continuous moderate amplitude moderate frequency symmetric tremors. The tremors are present at rest, with action and also in the postural position. No evidence of bradykinesia or rigidity.  REFLEXES: Deep tendon reflexes are symmetrical and normal. Babinski reflexes are flexor bilaterally.   SENSATION: Normal to light touch and pain.    Blood pressure (!) 167/84, pulse (!) 51, temperature 98.2 F (36.8 C), temperature source Oral, resp. rate 13, height 6' (1.829 m), weight 207 lb 0.2 oz (93.9 kg), SpO2 96 %.  Past Medical History:  Diagnosis Date  . Anxiety and depression   . Diabetes mellitus 06/09/2011   pt taking metformin  . DJD of shoulder    right   . Hyperlipidemia   . Hypertension   . Migraines   . Tremor   . Vitamin D deficiency     Past Surgical History:  Procedure Laterality Date  . BACK SURGERY    . KNEE SURGERY    . SHOULDER SURGERY      Family History  Problem Relation Age of Onset  . Cancer Sister   . Cancer Sister   . Cancer  Brother   . Migraines Neg Hx     Social History:  reports that he has never smoked. He has never used smokeless tobacco. He reports that he does not drink alcohol or use drugs.  Allergies: No Known Allergies  Medications: Prior to Admission medications   Medication Sig Start Date End Date Taking? Authorizing Provider  aspirin EC 325 MG tablet Take 325 mg by mouth every evening.   Yes Historical Provider, MD  citalopram (CELEXA) 20 MG tablet Take 20 mg by mouth at bedtime.   Yes Historical Provider, MD  gabapentin (NEURONTIN) 300 MG capsule Take 600 mg by mouth 3 (three) times daily.    Yes Historical Provider, MD  HYDROcodone-acetaminophen (NORCO) 10-325 MG per tablet Take 1 tablet by mouth every 6 (six) hours as needed for moderate pain.   Yes Historical Provider, MD  lisinopril (PRINIVIL,ZESTRIL) 20 MG tablet Take 20 mg by mouth at bedtime.    Yes Historical Provider, MD  lovastatin (MEVACOR) 20 MG tablet Take 20 mg by mouth at bedtime.    Yes Historical Provider, MD  primidone (MYSOLINE) 50 MG tablet Take 150 mg by mouth 3 (three) times  daily.    Yes Historical Provider, MD  propranolol (INDERAL) 40 MG tablet Take 40 mg by mouth 2 (two) times daily.   Yes Historical Provider, MD  topiramate (TOPAMAX) 100 MG tablet Take 200 mg by mouth at bedtime.   Yes Historical Provider, MD    Scheduled Meds: . aspirin EC  325 mg Oral QPM  . citalopram  20 mg Oral QHS  . gabapentin  300 mg Oral TID  . insulin aspart  0-5 Units Subcutaneous QHS  . insulin aspart  0-9 Units Subcutaneous TID WC  . insulin aspart  3 Units Subcutaneous TID WC  . lisinopril  20 mg Oral QHS  . primidone  75 mg Oral Q8H  . propranolol  40 mg Oral BID  . sodium chloride flush  3 mL Intravenous Q12H  . topiramate  200 mg Oral QHS   Continuous Infusions: . sodium chloride 75 mL/hr at 11/17/16 1700   PRN Meds:.     Results for orders placed or performed during the hospital encounter of 11/16/16 (from the past 48  hour(s))  TSH     Status: None   Collection Time: 11/16/16 12:00 AM  Result Value Ref Range   TSH 4.093 0.350 - 4.500 uIU/mL    Comment: Performed by a 3rd Generation assay with a functional sensitivity of <=0.01 uIU/mL.  Urine rapid drug screen (hosp performed)not at [facility]     Status: Abnormal   Collection Time: 11/16/16  3:14 PM  Result Value Ref Range   Opiates POSITIVE (A) NONE DETECTED   Cocaine NONE DETECTED NONE DETECTED   Benzodiazepines NONE DETECTED NONE DETECTED   Amphetamines NONE DETECTED NONE DETECTED   Tetrahydrocannabinol NONE DETECTED NONE DETECTED   Barbiturates POSITIVE (A) NONE DETECTED    Comment:        DRUG SCREEN FOR MEDICAL PURPOSES ONLY.  IF CONFIRMATION IS NEEDED FOR ANY PURPOSE, NOTIFY LAB WITHIN 5 DAYS.        LOWEST DETECTABLE LIMITS FOR URINE DRUG SCREEN Drug Class       Cutoff (ng/mL) Amphetamine      1000 Barbiturate      200 Benzodiazepine   195 Tricyclics       093 Opiates          300 Cocaine          300 THC              50   Urinalysis, Routine w reflex microscopic (not at [facility])     Status: None   Collection Time: 11/16/16  3:14 PM  Result Value Ref Range   Color, Urine YELLOW YELLOW   APPearance CLEAR CLEAR   Specific Gravity, Urine 1.020 1.005 - 1.030   pH 5.0 5.0 - 8.0   Glucose, UA NEGATIVE NEGATIVE mg/dL   Hgb urine dipstick NEGATIVE NEGATIVE   Bilirubin Urine NEGATIVE NEGATIVE   Ketones, ur NEGATIVE NEGATIVE mg/dL   Protein, ur NEGATIVE NEGATIVE mg/dL   Nitrite NEGATIVE NEGATIVE   Leukocytes, UA NEGATIVE NEGATIVE  Ethanol     Status: None   Collection Time: 11/16/16  3:28 PM  Result Value Ref Range   Alcohol, Ethyl (B) <5 <5 mg/dL    Comment:        LOWEST DETECTABLE LIMIT FOR SERUM ALCOHOL IS 5 mg/dL FOR MEDICAL PURPOSES ONLY   Protime-INR     Status: None   Collection Time: 11/16/16  3:28 PM  Result Value Ref Range   Prothrombin Time 13.7 11.4 -  15.2 seconds   INR 1.05   APTT     Status: None   Collection Time:  11/16/16  3:28 PM  Result Value Ref Range   aPTT 28 24 - 36 seconds  CBC     Status: Abnormal   Collection Time: 11/16/16  3:28 PM  Result Value Ref Range   WBC 6.8 4.0 - 10.5 K/uL   RBC 4.13 (L) 4.22 - 5.81 MIL/uL   Hemoglobin 13.3 13.0 - 17.0 g/dL   HCT 40.8 39.0 - 52.0 %   MCV 98.8 78.0 - 100.0 fL   MCH 32.2 26.0 - 34.0 pg   MCHC 32.6 30.0 - 36.0 g/dL   RDW 12.7 11.5 - 15.5 %   Platelets 223 150 - 400 K/uL  Differential     Status: None   Collection Time: 11/16/16  3:28 PM  Result Value Ref Range   Neutrophils Relative % 63 %   Neutro Abs 4.4 1.7 - 7.7 K/uL   Lymphocytes Relative 26 %   Lymphs Abs 1.7 0.7 - 4.0 K/uL   Monocytes Relative 8 %   Monocytes Absolute 0.6 0.1 - 1.0 K/uL   Eosinophils Relative 3 %   Eosinophils Absolute 0.2 0.0 - 0.7 K/uL   Basophils Relative 0 %   Basophils Absolute 0.0 0.0 - 0.1 K/uL  Comprehensive metabolic panel     Status: Abnormal   Collection Time: 11/16/16  3:28 PM  Result Value Ref Range   Sodium 140 135 - 145 mmol/L   Potassium 4.6 3.5 - 5.1 mmol/L   Chloride 108 101 - 111 mmol/L   CO2 28 22 - 32 mmol/L   Glucose, Bld 121 (H) 65 - 99 mg/dL   BUN 14 6 - 20 mg/dL   Creatinine, Ser 1.36 (H) 0.61 - 1.24 mg/dL   Calcium 8.6 (L) 8.9 - 10.3 mg/dL   Total Protein 7.2 6.5 - 8.1 g/dL   Albumin 3.8 3.5 - 5.0 g/dL   AST 16 15 - 41 U/L   ALT 14 (L) 17 - 63 U/L   Alkaline Phosphatase 103 38 - 126 U/L   Total Bilirubin 0.3 0.3 - 1.2 mg/dL   GFR calc non Af Amer 50 (L) >60 mL/min   GFR calc Af Amer 58 (L) >60 mL/min    Comment: (NOTE) The eGFR has been calculated using the CKD EPI equation. This calculation has not been validated in all clinical situations. eGFR's persistently <60 mL/min signify possible Chronic Kidney Disease.    Anion gap 4 (L) 5 - 15  Sedimentation rate     Status: Abnormal   Collection Time: 11/16/16  3:28 PM  Result Value Ref Range   Sed Rate 17 (H) 0 - 16 mm/hr  Vitamin B12     Status: Abnormal   Collection Time:  11/16/16  3:28 PM  Result Value Ref Range   Vitamin B-12 176 (L) 180 - 914 pg/mL    Comment: (NOTE) This assay is not validated for testing neonatal or myeloproliferative syndrome specimens for Vitamin B12 levels. Performed at [facility]   I-stat troponin, ED (not at [facility])     Status: None   Collection Time: 11/16/16  3:37 PM  Result Value Ref Range   Troponin i, poc 0.00 0.00 - 0.08 ng/mL   Comment 3            Comment: Due to the release kinetics of cTnI, a negative result within the first hours of the onset of  symptoms does not rule out myocardial infarction with certainty. If myocardial infarction is still suspected, repeat the test at appropriate intervals.   MRSA PCR Screening     Status: None   Collection Time: 11/16/16 11:50 PM  Result Value Ref Range   MRSA by PCR NEGATIVE NEGATIVE    Comment:        The GeneXpert MRSA Assay (FDA approved for NASAL specimens only), is one component of a comprehensive MRSA colonization surveillance program. It is not intended to diagnose MRSA infection nor to guide or monitor treatment for MRSA infections.   Glucose, capillary     Status: None   Collection Time: 11/17/16 12:46 AM  Result Value Ref Range   Glucose-Capillary 73 65 - 99 mg/dL   Comment 1 Notify RN   Glucose, capillary     Status: Abnormal   Collection Time: 11/17/16  1:30 AM  Result Value Ref Range   Glucose-Capillary 156 (H) 65 - 99 mg/dL  Ammonia     Status: None   Collection Time: 11/17/16  5:30 AM  Result Value Ref Range   Ammonia 16 9 - 35 umol/L  Comprehensive metabolic panel     Status: Abnormal   Collection Time: 11/17/16  5:30 AM  Result Value Ref Range   Sodium 138 135 - 145 mmol/L   Potassium 3.7 3.5 - 5.1 mmol/L    Comment: DELTA CHECK NOTED   Chloride 107 101 - 111 mmol/L   CO2 25 22 - 32 mmol/L   Glucose, Bld 112 (H) 65 - 99 mg/dL   BUN 12 6 - 20 mg/dL   Creatinine, Ser 1.07 0.61 - 1.24 mg/dL   Calcium 8.5 (L) 8.9 - 10.3  mg/dL   Total Protein 6.8 6.5 - 8.1 g/dL   Albumin 3.6 3.5 - 5.0 g/dL   AST 15 15 - 41 U/L   ALT 13 (L) 17 - 63 U/L   Alkaline Phosphatase 96 38 - 126 U/L   Total Bilirubin 0.3 0.3 - 1.2 mg/dL   GFR calc non Af Amer >60 >60 mL/min   GFR calc Af Amer >60 >60 mL/min    Comment: (NOTE) The eGFR has been calculated using the CKD EPI equation. This calculation has not been validated in all clinical situations. eGFR's persistently <60 mL/min signify possible Chronic Kidney Disease.    Anion gap 6 5 - 15  CBC     Status: Abnormal   Collection Time: 11/17/16  5:30 AM  Result Value Ref Range   WBC 6.9 4.0 - 10.5 K/uL   RBC 4.04 (L) 4.22 - 5.81 MIL/uL   Hemoglobin 12.6 (L) 13.0 - 17.0 g/dL   HCT 39.8 39.0 - 52.0 %   MCV 98.5 78.0 - 100.0 fL   MCH 31.2 26.0 - 34.0 pg   MCHC 31.7 30.0 - 36.0 g/dL   RDW 12.1 11.5 - 15.5 %   Platelets 192 150 - 400 K/uL  Glucose, capillary     Status: Abnormal   Collection Time: 11/17/16  4:00 PM  Result Value Ref Range   Glucose-Capillary 101 (H) 65 - 99 mg/dL    Studies/Results:  HEAD NECK CT FINDINGS: CT HEAD FINDINGS  Brain: No evidence of acute infarction, hemorrhage, hydrocephalus, extra-axial collection or mass lesion/mass effect. Mild chronic microvascular ischemic changes and parenchymal volume loss of the brain.  Vascular: Calcific atherosclerosis of cavernous internal carotid arteries.  Skull: Normal. Negative for fracture or focal lesion.  Sinuses/Orbits: No acute finding.  Other: None.  CT CERVICAL SPINE FINDINGS  Alignment: Straightening of cervical lordosis.  No dislocation.  Skull base and vertebrae: No acute fracture. No primary bone lesion or focal pathologic process. Anterior cervical discectomy and fusion of C5 and C6. No apparent hardware related complication or periprosthetic lucency.  Soft tissues and spinal canal: No prevertebral fluid or swelling. No visible canal hematoma.  Disc levels: Mild  cervical spondylosis with discogenic and facet arthropathy greatest from C4 through C7. No high-grade bony canal stenosis or foraminal narrowing.  Upper chest: Negative.  Other: Several partially calcified nodules within the thyroid gland measuring up to 11 mm in the right lobe of thyroid. Moderate calcific atherosclerosis of the left hand mild of the right carotid bifurcations. On  IMPRESSION: 1. No acute intracranial abnormality. 2. No acute fracture or dislocation of cervical spine. 3. Mild chronic microvascular ischemic changes and parenchymal volume loss of the brain. 4. Mild cervical spondylosis. 5. Left-greater-than-right calcific atherosclerosis of carotid bifurcations. 6. Thyroid nodules with coarse calcifications measuring up to 11 mm on the right. These can be further evaluated with thyroid ultrasound on a nonemergent basis.    BRAIN MRI FINDINGS: Brain: Only axial diffusion and sagittal T1 weighted imaging could be obtained.  No acute stroke or mass lesion. No restricted diffusion in the cortex or basal ganglia to suggest hypoxic ischemic injury. No hydrocephalus or visible extra-axial fluid. No midline abnormality.  IMPRESSION: Prematurely truncated exam demonstrating no acute stroke or visible signs of hypoxic ischemic insult.     L SPINE T SPINE MRI FINDINGS: MRI THORACIC SPINE FINDINGS  Alignment:  Physiologic.  Vertebrae: No fracture, evidence of discitis, or bone lesion.  Cord:  Normal signal and morphology.  Paraspinal and other soft tissues: Negative.  Disc levels:  Disc spaces: Disc spaces are maintained. Anterior cervical fusion at C5-6.  C7-T1: Mild broad-based disc bulge. No foraminal or central canal stenosis.  T1-T2: No disc protrusion, foraminal stenosis or central canal stenosis.  T2-T3: No disc protrusion, foraminal stenosis or central canal stenosis.  T3-T4: No disc protrusion, foraminal stenosis or  central canal stenosis.  T4-T5: No disc protrusion, foraminal stenosis or central canal stenosis.  T5-T6: No disc protrusion, foraminal stenosis or central canal stenosis.  T6-T7: No disc protrusion, foraminal stenosis or central canal stenosis.  T7-T8: No disc protrusion, foraminal stenosis or central canal stenosis.  T8-T9: No disc protrusion, foraminal stenosis or central canal stenosis.  T9-T10: No disc protrusion, foraminal stenosis or central canal stenosis.  T10-T11: No disc protrusion, foraminal stenosis or central canal stenosis.  T11-T12: No disc protrusion, foraminal stenosis or central canal stenosis.  MRI LUMBAR SPINE FINDINGS  Segmentation:  Standard.  Alignment:  Physiologic.  Vertebrae:  No fracture, evidence of discitis, or bone lesion.  Conus medullaris: Extends to the L1 level and appears normal.  Paraspinal and other soft tissues: No focal paraspinal abnormality.  Disc levels:  Disc spaces: Degenerative disc disease with disc desiccation throughout the lumbar spine. Mild disc height loss at L1-2 and L2-3.  T12-L1: No significant disc bulge. No evidence of neural foraminal stenosis. No central canal stenosis.  L1-L2: No significant disc bulge. No evidence of neural foraminal stenosis. No central canal stenosis.  L2-L3: Mild broad-based disc bulge. No evidence of neural foraminal stenosis. No central canal stenosis.  L3-L4: No significant disc bulge. No evidence of neural foraminal stenosis. No central canal stenosis.  L4-L5: Mild broad-based disc bulge. No evidence of neural foraminal stenosis. No central canal stenosis.  L5-S1: Moderate-sized right paracentral  disc protrusion versus scarring with mass effect on the right intraspinal S1 nerve root. Moderate bilateral facet arthropathy. Right L5 laminectomy defect. Mild right foraminal stenosis. No left foraminal stenosis. No central canal  stenosis.  IMPRESSION: MR THORACIC SPINE IMPRESSION  1. No acute injury of the thoracic spine. 2. No significant thoracic spine disc protrusion, foraminal stenosis or central canal stenosis.  MR LUMBAR SPINE IMPRESSION  1. At L5-S1 there is a moderate-sized right paracentral disc protrusion versus scarring with mass effect on the right intraspinal S1 nerve root. Moderate bilateral facet arthropathy. Right L5 laminectomy defect. Mild right foraminal stenosis.        Herald Vallin A. Merlene Richards, M.D.  Diplomate, Tax adviser of Psychiatry and Neurology ( Neurology). 11/17/2016, 7:06 PM

## 2016-11-17 NOTE — Progress Notes (Signed)
EEG Completed; Results Pending  

## 2016-11-17 NOTE — Progress Notes (Signed)
Pt had increased AMS. Kept repeating same statement over and over. "i'm ok..i'm ok". Then had a glazed look and was responding to a sternum rub. E-link was called via push button in room, rounding MD notified and came to see Pt. CBG was 73. MD ordered amp of D 50 push and start on D5 and 0.9%NS at 75 ml/hr CBG 15 min later was 156. He was more alert and oriented and tried ice chips and did well with that. Son is at bedside.  Nikayla Madaris Rica Mote, RN 3:38 AM

## 2016-11-18 DIAGNOSIS — E538 Deficiency of other specified B group vitamins: Secondary | ICD-10-CM

## 2016-11-18 LAB — HIV ANTIBODY (ROUTINE TESTING W REFLEX): HIV Screen 4th Generation wRfx: NONREACTIVE

## 2016-11-18 LAB — GLUCOSE, CAPILLARY
Glucose-Capillary: 114 mg/dL — ABNORMAL HIGH (ref 65–99)
Glucose-Capillary: 121 mg/dL — ABNORMAL HIGH (ref 65–99)
Glucose-Capillary: 97 mg/dL (ref 65–99)

## 2016-11-18 LAB — RPR: RPR Ser Ql: NONREACTIVE

## 2016-11-18 MED ORDER — CYANOCOBALAMIN 1000 MCG/ML IJ SOLN: 1000.0000 ug | Freq: Every day | INTRAMUSCULAR | 0 refills | Status: AC

## 2016-11-18 MED ORDER — PRIMIDONE 50 MG PO TABS: 75.0000 mg | ORAL_TABLET | Freq: Three times a day (TID) | ORAL | 3 refills | Status: DC

## 2016-11-18 NOTE — Progress Notes (Signed)
[facility] called and orders faxed for RN and PT.

## 2016-11-18 NOTE — Evaluation (Signed)
Physical Therapy Evaluation Patient Details Name: Daniel Richards MRN: [id number] DOB: 05/26/1946 Today's Date: 11/18/2016   History of Present Illness  71 y.o. male with hx chronic pain on Percocet 10mg  2 tablets q hs, anxiety and depression, HTN, HLD on statin, migraine HA on Topamax, tremors on Primidone, lives at home with his son, fell a few days and had rib Fx, found weak this am and more confused and delirious.  He normally has some trouble with his memory, but was more functional, as he was able to go to [facility] the day before.  This am, son found him on the floor, not able to stand up.  He was confused and "delirious".  There were reported that he would "blank out" after a grunt, and has been sleepy quite frequently, but that is not new.  There was thought from his neurologist before about OSA, and planned was to performed home sleep studies.  Evaluation in the ER showed unremarkable serology, CT of the neck and head was non contributory to his symptoms.  Hospitalist was asked to admit him for further evaluation.   Dx: Metabolic encephalopathy  Clinical Impression  Pt received in bed, and friend arrived during PT evaluation.  Pt is agreeable to PT evaluation.  Friend educated on several occasions not to assist the patient with mobility, that it is important to see what he is able to do for himself.  Pt states that normally he is independent with unlimited community ambulation, independent with ADL's, IADL's, and is still driving.  During PT evaluation, he required increased assistance up to Max A for supine<>sit due to L rib pain, however encouraged pt to sleep in recliner at home to reduce this pain.  He required Min guard for sit<>stand with RW, and ambulated 140ft with RW and Min guard.  Gait trial without RW demonstrated unsteadiness and scissor stepping, therefore continued use of RW.  Pt is recommended for HHPT upon d/c.  He states he can borrow his son's RW.      Follow Up Recommendations  Home health PT    Equipment Recommendations  None recommended by PT (Pt states he can use the RW his son received after his back surgery.  Son is no longer using it. )    Recommendations for Other Services       Precautions / Restrictions Precautions Precautions: Fall Precaution Comments: had a fall a week ago where he hit his head on the back steps.  Pt reports at least 3 falls.  Another fall in the shower where he broke his L great toe.   Restrictions Weight Bearing Restrictions: No      Mobility  Bed Mobility Overal bed mobility: Needs Assistance Bed Mobility: Supine to Sit;Rolling Rolling: Mod assist   Supine to sit: Max assist     General bed mobility comments: Encouraged pt to sleep in recliner when he returns home due to difficulty with supine<>sit with pain in rib.   Transfers Overall transfer level: Needs assistance Equipment used: Rolling walker (2 wheeled) Transfers: Sit to/from Stand Sit to Stand: Min guard            Ambulation/Gait Ambulation/Gait assistance: Min guard Ambulation Distance (Feet): 100 Feet Assistive device: Rolling walker (2 wheeled) Gait Pattern/deviations: Step-through pattern     General Gait Details: Attempted ambulation without RW, however pt was unsteady, and demonstrated scissor stepping.  Educated pt that he needs to use RW at home.   Stairs  Wheelchair Mobility    Modified Rankin (Stroke Patients Only)       Balance Overall balance assessment: History of Falls;Needs assistance Sitting-balance support: Feet supported Sitting balance-Leahy Scale: Good     Standing balance support: Bilateral upper extremity supported Standing balance-Leahy Scale: Poor                               Pertinent Vitals/Pain Pain Assessment: 0-10 Pain Location: Pt states the pain is from a broken rib L 10th rib, and a broken L great toe.   Ranges from a 4 at rest to 10 during mobility.   Pain Descriptors /  Indicators: Hervey Ard;Stabbing Pain Intervention(s): Limited activity within patient's tolerance;Monitored during session;Repositioned    Home Living   Living Arrangements: Children (son - home most of the time. ) Available Help at Discharge: Available PRN/intermittently;Family Type of Home: Mobile home Home Access: Stairs to enter   Entrance Stairs-Number of Steps: front: 5, back: 6 with HR on both sides for both front and back.   Home Layout: One level Home Equipment: Walker - 2 wheels;Crutches;Cane - single point;Shower seat      Prior Function     Gait / Transfers Assistance Needed: independent  ADL's / Homemaking Assistance Needed: independent with dressing, bathing, driving - unlimited community ambulator.         Hand Dominance   Dominant Hand: Right    Extremity/Trunk Assessment   Upper Extremity Assessment Upper Extremity Assessment: Overall WFL for tasks assessed    Lower Extremity Assessment Lower Extremity Assessment: Generalized weakness       Communication   Communication: No difficulties  Cognition Arousal/Alertness: Awake/alert Behavior During Therapy: WFL for tasks assessed/performed Overall Cognitive Status: Within Functional Limits for tasks assessed                      General Comments      Exercises     Assessment/Plan    PT Assessment Patient needs continued PT services  PT Problem List Decreased strength;Decreased activity tolerance;Decreased balance;Decreased mobility;Decreased knowledge of use of DME;Cardiopulmonary status limiting activity;Pain          PT Treatment Interventions DME instruction;Gait training;Functional mobility training;Therapeutic activities;Therapeutic exercise;Stair training;Balance training;Patient/family education    PT Goals (Current goals can be found in the Care Plan section)  Acute Rehab PT Goals Patient Stated Goal: Pt wants to go home.  PT Goal Formulation: With patient Time For Goal  Achievement: 11/25/16 Potential to Achieve Goals: Good    Frequency Min 3X/week   Barriers to discharge        Co-evaluation               End of Session Equipment Utilized During Treatment: Gait belt Activity Tolerance: Patient limited by fatigue Patient left: in chair;with call bell/phone within reach Nurse Communication: Mobility status Velta Addison, RN notified of pt's mobiltiy status, location, and d/c recommendations.   Mobility sheet left hanging in the room. )    Functional Assessment Tool Used: [employer]	[place] AM-PAC "6-clicks"  Functional Limitation: Mobility: Walking and moving around Mobility: Walking and Moving Around Current Status [phone]): At least 20 percent but less than 40 percent impaired, limited or restricted Mobility: Walking and Moving Around Goal Status [phone]): At least 1 percent but less than 20 percent impaired, limited or restricted    Time: 1310-1348 PT Time Calculation (min) (ACUTE ONLY): 38 min   Charges:   PT Evaluation $  PT Eval Low Complexity: 1 Procedure PT Treatments $Gait Training: 8-22 mins $Therapeutic Activity: 23-37 mins   PT G Codes:   PT G-Codes **NOT FOR INPATIENT CLASS** Functional Assessment Tool Used: [employer] "6-clicks"  Functional Limitation: Mobility: Walking and moving around Mobility: Walking and Moving Around Current Status [phone]): At least 20 percent but less than 40 percent impaired, limited or restricted Mobility: Walking and Moving Around Goal Status [phone]): At least 1 percent but less than 20 percent impaired, limited or restricted   Beth Jessyca Sloan, PT, DPT X: [phone]

## 2016-11-18 NOTE — Progress Notes (Signed)
Patient and family state understanding of discharge instructions, including that Daniel Richards will be providing home health services, prescription given.

## 2016-11-18 NOTE — Discharge Summary (Signed)
Physician Discharge Summary  MACGYVER CHAMPEAU [id number] DOB: Feb 11, 1946 DOA: 11/16/2016  PCP: Octavio Graves, DO  Admit date: 11/16/2016 Discharge date: 11/18/2016  Time spent: 45 minutes  Recommendations for Outpatient Follow-up:  -Will be discharged home today. -Advised to follow up with PCP in 2 weeks.   Discharge Diagnoses:  Active Problems:   Daytime somnolence   Essential tremor   Intractable headache   Weakness generalized   Altered mental status, unspecified   Fracture, rib   Weakness   B12 deficiency   Discharge Condition: Stable and improved  Filed Weights   11/16/16 1458 11/17/16 0000  Weight: 96.2 kg (212 lb) 93.9 kg (207 lb 0.2 oz)    History of present illness:  As per Dr. Marin Comment on 1/11: TAVON GONGWER is an 71 y.o. male with hx chronic pain on Percocet 10mg  2 tablets q hs, anxiety and depression, HTN, HLD on statin, migraine HA on Topamax, tremors on Primidone, lives at home with his son, fell a few days and had rib Fx, found weak this am and more confused and delirious.  He normally has some trouble with his memory, but was more functional, as he was able to go to [place] the day before.  This am, son found him on the floor, not able to stand up.  He was confused and "delirious".  There were reported that he would "blank out" after a grunt, and has been sleepy quite frequently, but that is not new.  There was thought from his neurologist before about OSA, and planned was to performed home sleep studies.  Evaluation in the ER showed unremarkable serology, CT of the neck and head was non contributory to his symptoms.  Hospitalist was asked to admit him for further evaluation.   Hospital Course:   Extremity weakness, daytime somnolent, blank stare -Etiology unclear, high suspicion for partial seizures given son's description of patient having normal conversation and then suddenly staring off blankly without interaction for a few minutes, this is sometimes  accompanied by right arm shaking although this is sometimes difficult to evaluate as he does have a history of essential tremors. -Had brain, thoracic and lumbar spine MRIs. Only abnormality was at the L5-S1 level with a moderate sized right paracentral disc protrusion with mass effect on the right infra spinal S1 nerve root. Unclear what if any bearing this has on patient's current presentation. -EEG has been ordered, no seizures observed. -Doubt Guillain-Barr, patient has been monitored overnight in the ICU and has not had any respiratory issues, will proceed with transfer to the floor today. -Has B12 deficiency which could be contributing to symptoms. Will start on daily B12 supplementation for 1 week and then monthly. -He has been taking increased hydrocodone he states up to 6 pills a day ever since he suffered a fractured rib after a fall, this may be playing a role in his symptoms as well although the falls started long before the rib fracture. -Mysoline and neurontin doses have been decreased at neurology's recommendation.  Procedures:  None   Consultations:  Neurology  Discharge Instructions  Discharge Instructions    Diet - low sodium heart healthy    Complete by:  As directed    Increase activity slowly    Complete by:  As directed      Allergies as of 11/18/2016   No Known Allergies     Medication List    STOP taking these medications   HYDROcodone-acetaminophen 10-325 MG tablet Commonly known  as:  NORCO   lovastatin 20 MG tablet Commonly known as:  MEVACOR     TAKE these medications   aspirin EC 325 MG tablet Take 325 mg by mouth every evening.   citalopram 20 MG tablet Commonly known as:  CELEXA Take 20 mg by mouth at bedtime.   cyanocobalamin 1000 MCG/ML injection Commonly known as:  (VITAMIN B-12) Inject 1 mL (1,000 mcg total) into the muscle daily at 10 pm.   gabapentin 300 MG capsule Commonly known as:  NEURONTIN Take 600 mg by mouth 3 (three)  times daily.   lisinopril 20 MG tablet Commonly known as:  PRINIVIL,ZESTRIL Take 20 mg by mouth at bedtime.   primidone 50 MG tablet Commonly known as:  MYSOLINE Take 1.5 tablets (75 mg total) by mouth every 8 (eight) hours. What changed:  how much to take  when to take this   propranolol 40 MG tablet Commonly known as:  INDERAL Take 40 mg by mouth 2 (two) times daily.   topiramate 100 MG tablet Commonly known as:  TOPAMAX Take 200 mg by mouth at bedtime.      No Known Allergies Follow-up Information    CYNTHIA BUTLER, DO. Schedule an appointment as soon as possible for a visit in 2 week(s).   Contact information: [place]  [id number] [phone]            The results of significant diagnostics from this hospitalization (including imaging, microbiology, ancillary and laboratory) are listed below for reference.    Significant Diagnostic Studies: Dg Chest 1 View  Result Date: 11/16/2016 CLINICAL DATA:  Status post fall two days ago getting out of the bathtub at home striking the side of the body on the edge of the toe. The patient reports left-sided back pain and posterior left rib discomfort. EXAM: CHEST 1 VIEW COMPARISON:  PA and lateral chest x-ray of June 09, 2011 FINDINGS: The lungs are well-expanded. There is no evidence of a pulmonary contusion, pleural effusion, or pneumothorax. The heart and pulmonary vascularity are normal. The mediastinum is normal in width. There may be a nondisplaced fracture through the posterolateral aspect of the left tenth rib. IMPRESSION: Possible nondisplaced fracture of the lateral aspect of the left tenth rib. The ribs are not completely included in the field of view. No acute cardiopulmonary abnormality. Electronically Signed   By: David  Martinique M.D.   On: 11/16/2016 16:16   Dg Thoracic Spine W/swimmers  Result Date: 11/16/2016 CLINICAL DATA:  [place] Tuesday at home getting out of the bath tub striking side on the  tub, LEFT side back pain EXAM: THORACIC SPINE - 3 VIEWS COMPARISON:  None FINDINGS: Twelve pairs of ribs. Diffuse osseous demineralization. Scattered mild vertebral endplate spur formation. Vertebral body heights maintained without fracture or subluxation. No bone destruction. Visualized posterior ribs unremarkable. 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 No acute abnormalities. Electronically Signed   By: Lavonia Dana M.D.   On: 11/16/2016 16:17   Dg Lumbar Spine Complete  Result Date: 11/16/2016 CLINICAL DATA:  [place] 2 days ago with back pain and weakness EXAM: LUMBAR SPINE - COMPLETE 4+ VIEW COMPARISON:  Lumbar spine films of 05/18/2015 FINDINGS: The lumbar vertebrae are unchanged in alignment. Intervertebral disc spaces appear normal and no compression deformity is seen. The bones are slightly osteopenic. The SI joints appear corticated. The sacral foramina are unremarkable. Moderate abdominal aortic atherosclerosis is noted. IMPRESSION: 1. Osteopenia.  No acute compression  deformity. 2. Moderate abdominal aortic atherosclerosis. Electronically Signed   By: Ivar Drape M.D.   On: 11/16/2016 16:17   Ct Head Wo Contrast  Result Date: 11/16/2016 CLINICAL DATA:  71 y/o M; found lying floor. History of recent falls. EXAM: CT HEAD WITHOUT CONTRAST CT CERVICAL SPINE WITHOUT CONTRAST TECHNIQUE: Multidetector CT imaging of the head and cervical spine was performed following the standard protocol without intravenous contrast. Multiplanar CT image reconstructions of the cervical spine were also generated. COMPARISON:  09/03/2015 MRI brain.  11/30/2011 MRI cervical spine. FINDINGS: CT HEAD FINDINGS Brain: No evidence of acute infarction, hemorrhage, hydrocephalus, extra-axial collection or mass lesion/mass effect. Mild chronic microvascular ischemic changes and parenchymal volume loss of the brain. Vascular: Calcific atherosclerosis of cavernous internal  carotid arteries. Skull: Normal. Negative for fracture or focal lesion. Sinuses/Orbits: No acute finding. Other: None. CT CERVICAL SPINE FINDINGS Alignment: Straightening of cervical lordosis.  No dislocation. Skull base and vertebrae: No acute fracture. No primary bone lesion or focal pathologic process. Anterior cervical discectomy and fusion of C5 and C6. No apparent hardware related complication or periprosthetic lucency. Soft tissues and spinal canal: No prevertebral fluid or swelling. No visible canal hematoma. Disc levels: Mild cervical spondylosis with discogenic and facet arthropathy greatest from C4 through C7. No high-grade bony canal stenosis or foraminal narrowing. Upper chest: Negative. Other: Several partially calcified nodules within the thyroid gland measuring up to 11 mm in the right lobe of thyroid. Moderate calcific atherosclerosis of the left hand mild of the right carotid bifurcations. On IMPRESSION: 1. No acute intracranial abnormality. 2. No acute fracture or dislocation of cervical spine. 3. Mild chronic microvascular ischemic changes and parenchymal volume loss of the brain. 4. Mild cervical spondylosis. 5. Left-greater-than-right calcific atherosclerosis of carotid bifurcations. 6. Thyroid nodules with coarse calcifications measuring up to 11 mm on the right. These can be further evaluated with thyroid ultrasound on a nonemergent basis. Electronically Signed   By: Kristine Garbe M.D.   On: 11/16/2016 16:33   Ct Cervical Spine Wo Contrast  Result Date: 11/16/2016 CLINICAL DATA:  71 y/o M; found lying floor. History of recent falls. EXAM: CT HEAD WITHOUT CONTRAST CT CERVICAL SPINE WITHOUT CONTRAST TECHNIQUE: Multidetector CT imaging of the head and cervical spine was performed following the standard protocol without intravenous contrast. Multiplanar CT image reconstructions of the cervical spine were also generated. COMPARISON:  09/03/2015 MRI brain.  11/30/2011 MRI cervical  spine. FINDINGS: CT HEAD FINDINGS Brain: No evidence of acute infarction, hemorrhage, hydrocephalus, extra-axial collection or mass lesion/mass effect. Mild chronic microvascular ischemic changes and parenchymal volume loss of the brain. Vascular: Calcific atherosclerosis of cavernous internal carotid arteries. Skull: Normal. Negative for fracture or focal lesion. Sinuses/Orbits: No acute finding. Other: None. CT CERVICAL SPINE FINDINGS Alignment: Straightening of cervical lordosis.  No dislocation. Skull base and vertebrae: No acute fracture. No primary bone lesion or focal pathologic process. Anterior cervical discectomy and fusion of C5 and C6. No apparent hardware related complication or periprosthetic lucency. Soft tissues and spinal canal: No prevertebral fluid or swelling. No visible canal hematoma. Disc levels: Mild cervical spondylosis with discogenic and facet arthropathy greatest from C4 through C7. No high-grade bony canal stenosis or foraminal narrowing. Upper chest: Negative. Other: Several partially calcified nodules within the thyroid gland measuring up to 11 mm in the right lobe of thyroid. Moderate calcific atherosclerosis of the left hand mild of the right carotid bifurcations. On IMPRESSION: 1. No acute intracranial abnormality. 2. No acute fracture or dislocation  of cervical spine. 3. Mild chronic microvascular ischemic changes and parenchymal volume loss of the brain. 4. Mild cervical spondylosis. 5. Left-greater-than-right calcific atherosclerosis of carotid bifurcations. 6. Thyroid nodules with coarse calcifications measuring up to 11 mm on the right. These can be further evaluated with thyroid ultrasound on a nonemergent basis. Electronically Signed   By: Kristine Garbe M.D.   On: 11/16/2016 16:33   Mr Brain Wo Contrast  Result Date: 11/17/2016 CLINICAL DATA:  Patient found down.  Altered mental status. EXAM: MRI HEAD WITHOUT CONTRAST TECHNIQUE: Multiplanar, multiecho pulse  sequences of the brain and surrounding structures were obtained without intravenous contrast. COMPARISON:  CT head 11/16/2016.  MR head 09/03/2015. FINDINGS: Brain: Only axial diffusion and sagittal T1 weighted imaging could be obtained. No acute stroke or mass lesion. No restricted diffusion in the cortex or basal ganglia to suggest hypoxic ischemic injury. No hydrocephalus or visible extra-axial fluid. No midline abnormality. IMPRESSION: Prematurely truncated exam demonstrating no acute stroke or visible signs of hypoxic ischemic insult. Electronically Signed   By: Staci Righter M.D.   On: 11/17/2016 13:08   Mr Thoracic Spine Wo Contrast  Result Date: 11/17/2016 CLINICAL DATA:  Son called 911 because he came to pt's house and found pt lying in the floor yesterday. Son reports pt sometimes takes more pain medication than is prescribed. Pt fell within the last week. EXAM: MRI THORACIC AND LUMBAR SPINE WITHOUT CONTRAST TECHNIQUE: Multiplanar and multiecho pulse sequences of the thoracic and lumbar spine were obtained without intravenous contrast. COMPARISON:  None. FINDINGS: MRI THORACIC SPINE FINDINGS Alignment:  Physiologic. Vertebrae: No fracture, evidence of discitis, or bone lesion. Cord:  Normal signal and morphology. Paraspinal and other soft tissues: Negative. Disc levels: Disc spaces: Disc spaces are maintained. Anterior cervical fusion at C5-6. C7-T1: Mild broad-based disc bulge. No foraminal or central canal stenosis. T1-T2: No disc protrusion, foraminal stenosis or central canal stenosis. T2-T3: No disc protrusion, foraminal stenosis or central canal stenosis. T3-T4: No disc protrusion, foraminal stenosis or central canal stenosis. T4-T5: No disc protrusion, foraminal stenosis or central canal stenosis. T5-T6: No disc protrusion, foraminal stenosis or central canal stenosis. T6-T7: No disc protrusion, foraminal stenosis or central canal stenosis. T7-T8: No disc protrusion, foraminal stenosis or  central canal stenosis. T8-T9: No disc protrusion, foraminal stenosis or central canal stenosis. T9-T10: No disc protrusion, foraminal stenosis or central canal stenosis. T10-T11: No disc protrusion, foraminal stenosis or central canal stenosis. T11-T12: No disc protrusion, foraminal stenosis or central canal stenosis. MRI LUMBAR SPINE FINDINGS Segmentation:  Standard. Alignment:  Physiologic. Vertebrae:  No fracture, evidence of discitis, or bone lesion. Conus medullaris: Extends to the L1 level and appears normal. Paraspinal and other soft tissues: No focal paraspinal abnormality. Disc levels: Disc spaces: Degenerative disc disease with disc desiccation throughout the lumbar spine. Mild disc height loss at L1-2 and L2-3. T12-L1: No significant disc bulge. No evidence of neural foraminal stenosis. No central canal stenosis. L1-L2: No significant disc bulge. No evidence of neural foraminal stenosis. No central canal stenosis. L2-L3: Mild broad-based disc bulge. No evidence of neural foraminal stenosis. No central canal stenosis. L3-L4: No significant disc bulge. No evidence of neural foraminal stenosis. No central canal stenosis. L4-L5: Mild broad-based disc bulge. No evidence of neural foraminal stenosis. No central canal stenosis. L5-S1: Moderate-sized right paracentral disc protrusion versus scarring with mass effect on the right intraspinal S1 nerve root. Moderate bilateral facet arthropathy. Right L5 laminectomy defect. Mild right foraminal stenosis. No left foraminal stenosis. No  central canal stenosis. IMPRESSION: MR THORACIC SPINE IMPRESSION 1. No acute injury of the thoracic spine. 2. No significant thoracic spine disc protrusion, foraminal stenosis or central canal stenosis. MR LUMBAR SPINE IMPRESSION 1. At L5-S1 there is a moderate-sized right paracentral disc protrusion versus scarring with mass effect on the right intraspinal S1 nerve root. Moderate bilateral facet arthropathy. Right L5 laminectomy  defect. Mild right foraminal stenosis. Electronically Signed   By: Kathreen Devoid   On: 11/17/2016 13:00   Mr Lumbar Spine Wo Contrast  Result Date: 11/17/2016 CLINICAL DATA:  Son called 911 because he came to pt's house and found pt lying in the floor yesterday. Son reports pt sometimes takes more pain medication than is prescribed. Pt fell within the last week. EXAM: MRI THORACIC AND LUMBAR SPINE WITHOUT CONTRAST TECHNIQUE: Multiplanar and multiecho pulse sequences of the thoracic and lumbar spine were obtained without intravenous contrast. COMPARISON:  None. FINDINGS: MRI THORACIC SPINE FINDINGS Alignment:  Physiologic. Vertebrae: No fracture, evidence of discitis, or bone lesion. Cord:  Normal signal and morphology. Paraspinal and other soft tissues: Negative. Disc levels: Disc spaces: Disc spaces are maintained. Anterior cervical fusion at C5-6. C7-T1: Mild broad-based disc bulge. No foraminal or central canal stenosis. T1-T2: No disc protrusion, foraminal stenosis or central canal stenosis. T2-T3: No disc protrusion, foraminal stenosis or central canal stenosis. T3-T4: No disc protrusion, foraminal stenosis or central canal stenosis. T4-T5: No disc protrusion, foraminal stenosis or central canal stenosis. T5-T6: No disc protrusion, foraminal stenosis or central canal stenosis. T6-T7: No disc protrusion, foraminal stenosis or central canal stenosis. T7-T8: No disc protrusion, foraminal stenosis or central canal stenosis. T8-T9: No disc protrusion, foraminal stenosis or central canal stenosis. T9-T10: No disc protrusion, foraminal stenosis or central canal stenosis. T10-T11: No disc protrusion, foraminal stenosis or central canal stenosis. T11-T12: No disc protrusion, foraminal stenosis or central canal stenosis. MRI LUMBAR SPINE FINDINGS Segmentation:  Standard. Alignment:  Physiologic. Vertebrae:  No fracture, evidence of discitis, or bone lesion. Conus medullaris: Extends to the L1 level and appears normal.  Paraspinal and other soft tissues: No focal paraspinal abnormality. Disc levels: Disc spaces: Degenerative disc disease with disc desiccation throughout the lumbar spine. Mild disc height loss at L1-2 and L2-3. T12-L1: No significant disc bulge. No evidence of neural foraminal stenosis. No central canal stenosis. L1-L2: No significant disc bulge. No evidence of neural foraminal stenosis. No central canal stenosis. L2-L3: Mild broad-based disc bulge. No evidence of neural foraminal stenosis. No central canal stenosis. L3-L4: No significant disc bulge. No evidence of neural foraminal stenosis. No central canal stenosis. L4-L5: Mild broad-based disc bulge. No evidence of neural foraminal stenosis. No central canal stenosis. L5-S1: Moderate-sized right paracentral disc protrusion versus scarring with mass effect on the right intraspinal S1 nerve root. Moderate bilateral facet arthropathy. Right L5 laminectomy defect. Mild right foraminal stenosis. No left foraminal stenosis. No central canal stenosis. IMPRESSION: MR THORACIC SPINE IMPRESSION 1. No acute injury of the thoracic spine. 2. No significant thoracic spine disc protrusion, foraminal stenosis or central canal stenosis. MR LUMBAR SPINE IMPRESSION 1. At L5-S1 there is a moderate-sized right paracentral disc protrusion versus scarring with mass effect on the right intraspinal S1 nerve root. Moderate bilateral facet arthropathy. Right L5 laminectomy defect. Mild right foraminal stenosis. Electronically Signed   By: Kathreen Devoid   On: 11/17/2016 13:00    Microbiology: Recent Results (from the past 240 hour(s))  MRSA PCR Screening     Status: None   Collection Time: 11/16/16  11:50 PM  Result Value Ref Range Status   MRSA by PCR NEGATIVE NEGATIVE Final    Comment:        The GeneXpert MRSA Assay (FDA approved for NASAL specimens only), is one component of a comprehensive MRSA colonization surveillance program. It is not intended to diagnose  MRSA infection nor to guide or monitor treatment for MRSA infections.      Labs: Basic Metabolic Panel:  Recent Labs Lab 11/16/16 1528 11/17/16 0530  NA 140 138  K 4.6 3.7  CL 108 107  CO2 28 25  GLUCOSE 121* 112*  BUN 14 12  CREATININE 1.36* 1.07  CALCIUM 8.6* 8.5*   Liver Function Tests:  Recent Labs Lab 11/16/16 1528 11/17/16 0530  AST 16 15  ALT 14* 13*  ALKPHOS 103 96  BILITOT 0.3 0.3  PROT 7.2 6.8  ALBUMIN 3.8 3.6   No results for input(s): LIPASE, AMYLASE in the last 168 hours.  Recent Labs Lab 11/17/16 0530  AMMONIA 16   CBC:  Recent Labs Lab 11/16/16 1528 11/17/16 0530  WBC 6.8 6.9  NEUTROABS 4.4  --   HGB 13.3 12.6*  HCT 40.8 39.8  MCV 98.8 98.5  PLT 223 192   Cardiac Enzymes: No results for input(s): CKTOTAL, CKMB, CKMBINDEX, TROPONINI in the last 168 hours. BNP: BNP (last 3 results) No results for input(s): BNP in the last 8760 hours.  ProBNP (last 3 results) No results for input(s): PROBNP in the last 8760 hours.  CBG:  Recent Labs Lab 11/17/16 0130 11/17/16 1600 11/17/16 2246 11/18/16 0745 11/18/16 1155  GLUCAP 156* 101* 122* 114* 121*       Signed:  HERNANDEZ ACOSTA,ESTELA  [facility] Pager: [phone] 11/18/2016, 3:45 PM

## 2016-11-19 NOTE — Care Management Note (Signed)
Case Management Note  Patient Details  Name: Daniel Richards MRN: [id number] Date of Birth: 05-19-1946  Subjective/Objective:                  Daytime somnolence   Essential tremor Action/Plan: Discharge planning Expected Discharge Date:  11/18/16               Expected Discharge Plan:  [place]  In-House Referral:     Discharge planning Services  CM Consult  Post Acute Care Choice:  Home Health Choice offered to:  NA  DME Arranged:  N/A DME Agency:  NA  HH Arranged:  PT [place] Agency:  [facility]  Status of Service:  Completed, signed off  If discussed at [place] of Stay Meetings, dates discussed:    Additional Comments: CM received call post discharge pt wishes to have [facility] render HHPT and please arrange.  CM notified [facility] rep, Jermaine to please arrange for HHPT.  No other CM needs were communicated. Dellie Catholic, RN 11/19/2016, 8:59 AM

## 2019-07-24 ENCOUNTER — Inpatient Hospital Stay (HOSPITAL_COMMUNITY)
Admission: EM | Admit: 2019-07-24 | Discharge: 2019-08-01 | DRG: 824 | Disposition: A | Discharge status: Skilled Nursing Facility | Payer: Medicare Other | Attending: Internal Medicine | Admitting: Internal Medicine

## 2019-07-24 ENCOUNTER — Emergency Department (HOSPITAL_COMMUNITY): Payer: Medicare Other

## 2019-07-24 ENCOUNTER — Encounter (HOSPITAL_COMMUNITY): Payer: Self-pay | Admitting: Radiology

## 2019-07-24 ENCOUNTER — Other Ambulatory Visit: Payer: Self-pay

## 2019-07-24 DIAGNOSIS — R296 Repeated falls: Secondary | ICD-10-CM

## 2019-07-24 DIAGNOSIS — R531 Weakness: Secondary | ICD-10-CM

## 2019-07-24 DIAGNOSIS — Z515 Encounter for palliative care: Secondary | ICD-10-CM

## 2019-07-24 DIAGNOSIS — Z7982 Long term (current) use of aspirin: Secondary | ICD-10-CM

## 2019-07-24 DIAGNOSIS — R1084 Generalized abdominal pain: Secondary | ICD-10-CM

## 2019-07-24 DIAGNOSIS — Z7984 Long term (current) use of oral hypoglycemic drugs: Secondary | ICD-10-CM

## 2019-07-24 DIAGNOSIS — Z7189 Other specified counseling: Secondary | ICD-10-CM

## 2019-07-24 DIAGNOSIS — G250 Essential tremor: Secondary | ICD-10-CM | POA: Diagnosis present

## 2019-07-24 DIAGNOSIS — C8333 Diffuse large B-cell lymphoma, intra-abdominal lymph nodes: Secondary | ICD-10-CM | POA: Diagnosis not present

## 2019-07-24 DIAGNOSIS — E869 Volume depletion, unspecified: Secondary | ICD-10-CM | POA: Diagnosis present

## 2019-07-24 DIAGNOSIS — D509 Iron deficiency anemia, unspecified: Secondary | ICD-10-CM | POA: Diagnosis present

## 2019-07-24 DIAGNOSIS — E1142 Type 2 diabetes mellitus with diabetic polyneuropathy: Secondary | ICD-10-CM | POA: Diagnosis present

## 2019-07-24 DIAGNOSIS — Z87891 Personal history of nicotine dependence: Secondary | ICD-10-CM

## 2019-07-24 DIAGNOSIS — Z20828 Contact with and (suspected) exposure to other viral communicable diseases: Secondary | ICD-10-CM | POA: Diagnosis present

## 2019-07-24 DIAGNOSIS — I2510 Atherosclerotic heart disease of native coronary artery without angina pectoris: Secondary | ICD-10-CM | POA: Diagnosis present

## 2019-07-24 DIAGNOSIS — E538 Deficiency of other specified B group vitamins: Secondary | ICD-10-CM

## 2019-07-24 DIAGNOSIS — Z801 Family history of malignant neoplasm of trachea, bronchus and lung: Secondary | ICD-10-CM

## 2019-07-24 DIAGNOSIS — R251 Tremor, unspecified: Secondary | ICD-10-CM | POA: Diagnosis present

## 2019-07-24 DIAGNOSIS — Z79899 Other long term (current) drug therapy: Secondary | ICD-10-CM

## 2019-07-24 DIAGNOSIS — R339 Retention of urine, unspecified: Secondary | ICD-10-CM | POA: Diagnosis present

## 2019-07-24 DIAGNOSIS — E785 Hyperlipidemia, unspecified: Secondary | ICD-10-CM | POA: Diagnosis present

## 2019-07-24 DIAGNOSIS — R590 Localized enlarged lymph nodes: Secondary | ICD-10-CM

## 2019-07-24 DIAGNOSIS — I129 Hypertensive chronic kidney disease with stage 1 through stage 4 chronic kidney disease, or unspecified chronic kidney disease: Secondary | ICD-10-CM | POA: Diagnosis present

## 2019-07-24 DIAGNOSIS — E1122 Type 2 diabetes mellitus with diabetic chronic kidney disease: Secondary | ICD-10-CM | POA: Diagnosis present

## 2019-07-24 DIAGNOSIS — R1310 Dysphagia, unspecified: Secondary | ICD-10-CM | POA: Diagnosis present

## 2019-07-24 DIAGNOSIS — N179 Acute kidney failure, unspecified: Secondary | ICD-10-CM | POA: Diagnosis present

## 2019-07-24 DIAGNOSIS — Z66 Do not resuscitate: Secondary | ICD-10-CM | POA: Diagnosis present

## 2019-07-24 DIAGNOSIS — N183 Chronic kidney disease, stage 3 (moderate): Secondary | ICD-10-CM | POA: Diagnosis present

## 2019-07-24 DIAGNOSIS — E440 Moderate protein-calorie malnutrition: Secondary | ICD-10-CM | POA: Diagnosis present

## 2019-07-24 LAB — COMPREHENSIVE METABOLIC PANEL
ALT: 16 U/L (ref 0–44)
AST: 18 U/L (ref 15–41)
Albumin: 3.3 g/dL — ABNORMAL LOW (ref 3.5–5.0)
Alkaline Phosphatase: 78 U/L (ref 38–126)
Anion gap: 9 (ref 5–15)
BUN: 24 mg/dL — ABNORMAL HIGH (ref 8–23)
CO2: 27 mmol/L (ref 22–32)
Calcium: 10.8 mg/dL — ABNORMAL HIGH (ref 8.9–10.3)
Chloride: 102 mmol/L (ref 98–111)
Creatinine, Ser: 1.64 mg/dL — ABNORMAL HIGH (ref 0.61–1.24)
GFR calc Af Amer: 47 mL/min — ABNORMAL LOW (ref >60)
GFR calc non Af Amer: 40 mL/min — ABNORMAL LOW (ref >60)
Glucose, Bld: 113 mg/dL — ABNORMAL HIGH (ref 70–99)
Potassium: 3.9 mmol/L (ref 3.5–5.1)
Sodium: 138 mmol/L (ref 135–145)
Total Bilirubin: 0.6 mg/dL (ref 0.3–1.2)
Total Protein: 7.1 g/dL (ref 6.5–8.1)

## 2019-07-24 LAB — CBC WITH DIFFERENTIAL/PLATELET
Abs Immature Granulocytes: 0.01 K/uL (ref 0.00–0.07)
Basophils Absolute: 0 K/uL (ref 0.0–0.1)
Basophils Relative: 1 %
Eosinophils Absolute: 0 K/uL (ref 0.0–0.5)
Eosinophils Relative: 1 %
HCT: 32.2 % — ABNORMAL LOW (ref 39.0–52.0)
Hemoglobin: 10.1 g/dL — ABNORMAL LOW (ref 13.0–17.0)
Immature Granulocytes: 0 %
Lymphocytes Relative: 31 %
Lymphs Abs: 1.3 K/uL (ref 0.7–4.0)
MCH: 32.9 pg (ref 26.0–34.0)
MCHC: 31.4 g/dL (ref 30.0–36.0)
MCV: 104.9 fL — ABNORMAL HIGH (ref 80.0–100.0)
Monocytes Absolute: 0.9 K/uL (ref 0.1–1.0)
Monocytes Relative: 23 %
Neutro Abs: 1.7 K/uL (ref 1.7–7.7)
Neutrophils Relative %: 44 %
Platelets: 201 K/uL (ref 150–400)
RBC: 3.07 MIL/uL — ABNORMAL LOW (ref 4.22–5.81)
RDW: 16.1 % — ABNORMAL HIGH (ref 11.5–15.5)
WBC: 4 K/uL (ref 4.0–10.5)
nRBC: 0 % (ref 0.0–0.2)

## 2019-07-24 LAB — URINALYSIS, ROUTINE W REFLEX MICROSCOPIC
Glucose, UA: NEGATIVE mg/dL
Ketones, ur: NEGATIVE mg/dL
Leukocytes,Ua: NEGATIVE
Nitrite: NEGATIVE
Specific Gravity, Urine: 1.025 (ref 1.005–1.030)
pH: 6 (ref 5.0–8.0)

## 2019-07-24 LAB — LIPASE, BLOOD: Lipase: 21 U/L (ref 11–51)

## 2019-07-24 LAB — CK: Total CK: 34 U/L — ABNORMAL LOW (ref 49–397)

## 2019-07-24 LAB — URINALYSIS, MICROSCOPIC (REFLEX)

## 2019-07-24 LAB — CBG MONITORING, ED: Glucose-Capillary: 119 mg/dL — ABNORMAL HIGH (ref 70–99)

## 2019-07-24 MED ORDER — MORPHINE SULFATE (PF) 4 MG/ML IV SOLN
4.0000 mg | Freq: Once | INTRAVENOUS | Status: AC
  Administered 2019-07-24: 4 mg via INTRAVENOUS
  Filled 2019-07-24: qty 1

## 2019-07-24 MED ORDER — IOHEXOL 300 MG/ML  SOLN
100.0000 mL | Freq: Once | INTRAMUSCULAR | Status: AC | PRN
  Administered 2019-07-24: 75 mL via INTRAVENOUS

## 2019-07-24 MED ORDER — SODIUM CHLORIDE 0.9 % IV BOLUS
500.0000 mL | Freq: Once | INTRAVENOUS | Status: AC
  Administered 2019-07-24: 500 mL via INTRAVENOUS

## 2019-07-24 MED ORDER — IOHEXOL 300 MG/ML  SOLN
30.0000 mL | Freq: Once | INTRAMUSCULAR | Status: AC | PRN
  Administered 2019-07-24: 30 mL via ORAL

## 2019-07-24 NOTE — ED Notes (Signed)
Pt encouraged to give urine sample. Pt continues to report that he is unable to give one at this time.

## 2019-07-24 NOTE — ED Notes (Signed)
Pt encouraged again to give urine sample. Pt continues to state "I have no urine to give. I wish I did."

## 2019-07-24 NOTE — ED Provider Notes (Signed)
Pt's care assumed at 5pm.   Pt has had 2 falls in the last 2 days.  Pt complains of abdominal pain and increasing weakness.  No fracture from falls.  Labs returned.  Pt has increase in bun and creat.  Son reports pt is not eating or drinking at home. Ct  Abdomen shows multiple abdominal lymph nodes.  Pt and son counseled on results.   I will give pt Iv fluids.  I advised pt and son.  He needs biopsy further evaluation Hospitalist consulted     Sidney Ace 07/24/19 2106    Davonna Belling, MD 07/24/19 [phone]

## 2019-07-24 NOTE — H&P (Signed)
TRH H&P    Patient Demographics:    Daniel Richards, is a 73 y.o. male  MRN: [id number]  DOB - 1945/11/11  Admit Date - 07/24/2019  Referring MD/NP/PA: Johnn Hai  Outpatient Primary MD for the patient is Scotty Court, DO  Patient coming from: Home  Chief complaint-fall   HPI:    Daniel Richards  is a 73 y.o. male, with history of hypertension, hyperlipidemia, diabetes mellitus type 2, tremor came to ED with chief complaints of falls at home.  Patient says that he has been feeling more weak and is having recurrent falls at home.  Also complains of abdominal pain. He denies passing out today.  But says that he has passed out in the past. In the ED CT scan of the abdomen pelvis showed multiple abdominal lymph nodes. Lab work showed elevated creatinine 1.64, with calcium 10.8.  CK 34.  Hemoglobin 10.1 MCV 104.9 He denies chest pain or shortness of breath. Denies nausea vomiting or diarrhea. Complains of constipation Denies dysuria    Review of systems:    In addition to the HPI above,    All other systems reviewed and are negative.    Past History of the following :    Past Medical History:  Diagnosis Date   Anxiety and depression    Diabetes mellitus 06/09/2011   pt taking metformin   DJD of shoulder    right    Hyperlipidemia    Hypertension    Migraines    Tremor    Vitamin D deficiency       Past Surgical History:  Procedure Laterality Date   BACK SURGERY     KNEE SURGERY     SHOULDER SURGERY        Social History:      Social History   Tobacco Use   Smoking status: Never Smoker   Smokeless tobacco: Never Used  Substance Use Topics   Alcohol use: No       Family History :     Family History  Problem Relation Age of Onset   Cancer Sister    Cancer Sister    Cancer Brother    Migraines Neg Hx       Home Medications:   Prior to  Admission medications   Medication Sig Start Date End Date Taking? Authorizing Provider  aspirin EC 325 MG tablet Take 325 mg by mouth every evening.    [provider]  calcitRIOL (ROCALTROL) 0.5 MCG capsule Take 1 capsule by mouth daily. 05/20/19   [provider]  citalopram (CELEXA) 20 MG tablet Take 20 mg by mouth at bedtime.    [provider]  gabapentin (NEURONTIN) 300 MG capsule Take 600 mg by mouth 3 (three) times daily.     [provider]  HYDROcodone-acetaminophen (NORCO) 10-325 MG tablet Take 1 tablet by mouth 4 (four) times daily as needed. 05/27/19   [provider]  INVOKANA 100 MG TABS tablet Take 1 tablet by mouth daily. 07/17/19   [provider]  lisinopril (  PRINIVIL,ZESTRIL) 20 MG tablet Take 20 mg by mouth at bedtime.     [provider]  metFORMIN (GLUCOPHAGE) 500 MG tablet Take 1 tablet by mouth daily. 07/02/19   [provider]  primidone (MYSOLINE) 50 MG tablet Take 1.5 tablets (75 mg total) by mouth every 8 (eight) hours. 11/18/16   Isaac Bliss, Rayford Halsted, MD  propranolol (INDERAL) 40 MG tablet Take 40 mg by mouth 2 (two) times daily.    [provider]  tamsulosin (FLOMAX) 0.4 MG CAPS capsule Take 1 capsule by mouth daily. 07/02/19   [provider]  topiramate (TOPAMAX) 100 MG tablet Take 200 mg by mouth at bedtime.    [provider]     Allergies:    No Known Allergies   Physical Exam:   Vitals  Blood pressure (!) 182/98, pulse 88, temperature 98.2 F (36.8 C), temperature source Oral, resp. rate 15, height 5\' 11"  (1.803 m), weight 90.7 kg, SpO2 93 %.  1.  General: Appears in no acute distress  2. Psychiatric: Alert, oriented x3, intact insight and judgment  3. Neurologic: Cranial nerves II through XII grossly intact, motor strength 5/5 in all extremities, sensations intact  4. HEENMT:  Atraumatic normocephalic, extraocular muscles intact  5.  Respiratory : Clear to auscultation bilaterally, no wheezing or crackles  6. Cardiovascular : S1-S2, regular, no murmur auscultated  7. Gastrointestinal:  Abdomen is soft, mild tenderness in suprapubic region  8. Skin:  No rashes noted      Data Review:    CBC Recent Labs  Lab 07/24/19 1551  WBC 4.0  HGB 10.1*  HCT 32.2*  PLT 201  MCV 104.9*  MCH 32.9  MCHC 31.4  RDW 16.1*  LYMPHSABS 1.3  MONOABS 0.9  EOSABS 0.0  BASOSABS 0.0   ------------------------------------------------------------------------------------------------------------------  Results for orders placed or performed during the hospital encounter of 07/24/19 (from the past 48 hour(s))  POC CBG, ED     Status: Abnormal   Collection Time: 07/24/19  2:38 PM  Result Value Ref Range   Glucose-Capillary 119 (H) 70 - 99 mg/dL  CBC with Differential     Status: Abnormal   Collection Time: 07/24/19  3:51 PM  Result Value Ref Range   WBC 4.0 4.0 - 10.5 K/uL   RBC 3.07 (L) 4.22 - 5.81 MIL/uL   Hemoglobin 10.1 (L) 13.0 - 17.0 g/dL   HCT 32.2 (L) 39.0 - 52.0 %   MCV 104.9 (H) 80.0 - 100.0 fL   MCH 32.9 26.0 - 34.0 pg   MCHC 31.4 30.0 - 36.0 g/dL   RDW 16.1 (H) 11.5 - 15.5 %   Platelets 201 150 - 400 K/uL   nRBC 0.0 0.0 - 0.2 %   Neutrophils Relative % 44 %   Neutro Abs 1.7 1.7 - 7.7 K/uL   Lymphocytes Relative 31 %   Lymphs Abs 1.3 0.7 - 4.0 K/uL   Monocytes Relative 23 %   Monocytes Absolute 0.9 0.1 - 1.0 K/uL   Eosinophils Relative 1 %   Eosinophils Absolute 0.0 0.0 - 0.5 K/uL   Basophils Relative 1 %   Basophils Absolute 0.0 0.0 - 0.1 K/uL   Immature Granulocytes 0 %   Abs Immature Granulocytes 0.01 0.00 - 0.07 K/uL    Comment: Performed at [facility], [address], [place], [place] [address]  Comprehensive metabolic panel     Status: Abnormal   Collection Time: 07/24/19  3:51 PM  Result Value Ref Range  Sodium 138 135 - 145 mmol/L   Potassium 3.9 3.5 - 5.1 mmol/L   Chloride 102 98 - 111  mmol/L   CO2 27 22 - 32 mmol/L   Glucose, Bld 113 (H) 70 - 99 mg/dL   BUN 24 (H) 8 - 23 mg/dL   Creatinine, Ser 1.64 (H) 0.61 - 1.24 mg/dL   Calcium 10.8 (H) 8.9 - 10.3 mg/dL   Total Protein 7.1 6.5 - 8.1 g/dL   Albumin 3.3 (L) 3.5 - 5.0 g/dL   AST 18 15 - 41 U/L   ALT 16 0 - 44 U/L   Alkaline Phosphatase 78 38 - 126 U/L   Total Bilirubin 0.6 0.3 - 1.2 mg/dL   GFR calc non Af Amer 40 (L) >60 mL/min   GFR calc Af Amer 47 (L) >60 mL/min   Anion gap 9 5 - 15    Comment: Performed at [facility], [address], [place], [place] [address]  Lipase, blood     Status: None   Collection Time: 07/24/19  3:51 PM  Result Value Ref Range   Lipase 21 11 - 51 U/L    Comment: Performed at [facility], [address], [place], [place] [address]  CK     Status: Abnormal   Collection Time: 07/24/19  3:51 PM  Result Value Ref Range   Total CK 34 (L) 49 - 397 U/L    Comment: Performed at [facility], [address], [place], [place] [address]  Urinalysis, Routine w reflex microscopic     Status: Abnormal   Collection Time: 07/24/19  6:41 PM  Result Value Ref Range   Color, Urine YELLOW YELLOW   APPearance CLEAR CLEAR   Specific Gravity, Urine 1.025 1.005 - 1.030   pH 6.0 5.0 - 8.0   Glucose, UA NEGATIVE NEGATIVE mg/dL   Hgb urine dipstick TRACE (A) NEGATIVE   Bilirubin Urine SMALL (A) NEGATIVE   Ketones, ur NEGATIVE NEGATIVE mg/dL   Protein, ur TRACE (A) NEGATIVE mg/dL   Nitrite NEGATIVE NEGATIVE   Leukocytes,Ua NEGATIVE NEGATIVE    Comment: Performed at [facility], [address], [place], [place] [address]  Urinalysis, Microscopic (reflex)     Status: Abnormal   Collection Time: 07/24/19  6:41 PM  Result Value Ref Range   RBC / HPF 0-5 0 - 5 RBC/hpf   WBC, UA 0-5 0 - 5 WBC/hpf   Bacteria, UA RARE (A) NONE SEEN   Squamous Epithelial / LPF 0-5 0 - 5   Budding Yeast PRESENT     Comment: Performed at [facility], [address], [place], [place] [address]    Chemistries  Recent Labs    Lab 07/24/19 1551  NA 138  K 3.9  CL 102  CO2 27  GLUCOSE 113*  BUN 24*  CREATININE 1.64*  CALCIUM 10.8*  AST 18  ALT 16  ALKPHOS 78  BILITOT 0.6   Liver Function Tests: Recent Labs  Lab 07/24/19 1551  AST 18  ALT 16  ALKPHOS 78  BILITOT 0.6  PROT 7.1  ALBUMIN 3.3*   Recent Labs  Lab 07/24/19 1551  LIPASE 21   No results for input(s): AMMONIA in the last 168 hours. Coagulation Profile: No results for input(s): INR, PROTIME in the last 168 hours. Cardiac Enzymes: Recent Labs  Lab 07/24/19 1551  CKTOTAL 34*   BNP (last 3 results) No results for input(s): PROBNP in the last 8760 hours. HbA1C: No results for input(s): HGBA1C in the  last 72 hours. CBG: Recent Labs  Lab 07/24/19 1438  GLUCAP 119*    --------------------------------------------------------------------------------------------------------------- Urine analysis:    Component Value Date/Time   COLORURINE YELLOW 07/24/2019 1841   APPEARANCEUR CLEAR 07/24/2019 1841   LABSPEC 1.025 07/24/2019 1841   PHURINE 6.0 07/24/2019 1841   GLUCOSEU NEGATIVE 07/24/2019 1841   HGBUR TRACE (A) 07/24/2019 1841   BILIRUBINUR SMALL (A) 07/24/2019 1841   KETONESUR NEGATIVE 07/24/2019 1841   PROTEINUR TRACE (A) 07/24/2019 1841   UROBILINOGEN 1.0 11/18/2014 1238   NITRITE NEGATIVE 07/24/2019 1841   LEUKOCYTESUR NEGATIVE 07/24/2019 1841      Imaging Results:    Ct Head Wo Contrast  Result Date: 07/24/2019 CLINICAL DATA:  Trauma. EXAM: CT HEAD WITHOUT CONTRAST CT CERVICAL SPINE WITHOUT CONTRAST TECHNIQUE: Multidetector CT imaging of the head and cervical spine was performed following the standard protocol without intravenous contrast. Multiplanar CT image reconstructions of the cervical spine were also generated. COMPARISON:  CT scan of November 16, 2016. FINDINGS: CT HEAD FINDINGS Brain: Mild chronic ischemic white matter disease is noted. No mass effect or midline shift is noted. Ventricular size is within  normal limits. There is no evidence of mass lesion, hemorrhage or acute infarction. Vascular: No hyperdense vessel or unexpected calcification. Skull: Normal. Negative for fracture or focal lesion. Sinuses/Orbits: Right frontal and ethmoid mucosal thickening is noted. Other: None. CT CERVICAL SPINE FINDINGS Alignment: Normal. Skull base and vertebrae: No acute fracture. No primary bone lesion or focal pathologic process. Soft tissues and spinal canal: No prevertebral fluid or swelling. No visible canal hematoma. Disc levels: Status post surgical anterior fusion of C5-6. Mild anterior osteophyte formation is also noted at C4-5 and C6-7. Upper chest: Negative. Other: None. IMPRESSION: Mild chronic ischemic white matter disease. No acute intracranial abnormality seen. Postsurgical and degenerative changes are noted in the cervical spine. No fracture or significant spondylolisthesis is noted. Electronically Signed   By: Marijo Conception M.D.   On: 07/24/2019 16:20   Ct Cervical Spine Wo Contrast  Result Date: 07/24/2019 CLINICAL DATA:  Trauma. EXAM: CT HEAD WITHOUT CONTRAST CT CERVICAL SPINE WITHOUT CONTRAST TECHNIQUE: Multidetector CT imaging of the head and cervical spine was performed following the standard protocol without intravenous contrast. Multiplanar CT image reconstructions of the cervical spine were also generated. COMPARISON:  CT scan of November 16, 2016. FINDINGS: CT HEAD FINDINGS Brain: Mild chronic ischemic white matter disease is noted. No mass effect or midline shift is noted. Ventricular size is within normal limits. There is no evidence of mass lesion, hemorrhage or acute infarction. Vascular: No hyperdense vessel or unexpected calcification. Skull: Normal. Negative for fracture or focal lesion. Sinuses/Orbits: Right frontal and ethmoid mucosal thickening is noted. Other: None. CT CERVICAL SPINE FINDINGS Alignment: Normal. Skull base and vertebrae: No acute fracture. No primary bone lesion or  focal pathologic process. Soft tissues and spinal canal: No prevertebral fluid or swelling. No visible canal hematoma. Disc levels: Status post surgical anterior fusion of C5-6. Mild anterior osteophyte formation is also noted at C4-5 and C6-7. Upper chest: Negative. Other: None. IMPRESSION: Mild chronic ischemic white matter disease. No acute intracranial abnormality seen. Postsurgical and degenerative changes are noted in the cervical spine. No fracture or significant spondylolisthesis is noted. Electronically Signed   By: Marijo Conception M.D.   On: 07/24/2019 16:20   Ct Abdomen Pelvis W Contrast  Result Date: 07/24/2019 CLINICAL DATA:  Bilateral leg weakness. Patient fell in the bathtub today. EXAM: CT ABDOMEN AND PELVIS WITH  CONTRAST TECHNIQUE: Multidetector CT imaging of the abdomen and pelvis was performed using the standard protocol following bolus administration of intravenous contrast. CONTRAST:  53mL OMNIPAQUE IOHEXOL 300 MG/ML SOLN, 10mL OMNIPAQUE IOHEXOL 300 MG/ML SOLN COMPARISON:  06/09/2011 FINDINGS: Lower chest: Minor linear atelectasis or scarring in the anterior lung bases. Lung bases otherwise clear. Heart normal in size. Hepatobiliary: No focal liver abnormality is seen. No gallstones, gallbladder wall thickening, or biliary dilatation. Pancreas: Unremarkable. No pancreatic ductal dilatation or surrounding inflammatory changes. Spleen: Normal in size without focal abnormality. Adrenals/Urinary Tract: No adrenal masses. Kidneys are normal in position. There is bilateral renal cortical thinning. 18 mm low-attenuation mass, posterior midpole the left kidney, consistent with a cyst. No other renal masses. Small nonobstructing stones in both kidneys. No hydronephrosis. Ureters normal in course and in caliber. No ureteral stones. Bladder moderately distended. There are dependent stones with some mild increased attenuation in the dependent bladder is well, likely debris. No wall thickening or  convincing mass. Stomach/Bowel: Stomach is unremarkable. Small bowel and colon are normal in caliber. No wall thickening. No inflammation. Mild increased colonic stool burden. No evidence of appendicitis. Appendix not visualized. Vascular/Lymphatic: There is extensive adenopathy. Bulky retroperitoneal adenopathy is noted extending from level of the adrenal glands into the pelvis. Node at the level of the midpole the left kidney, left periaortic, measures 4.3 cm in short axis. Left periaortic node at the level of the bifurcation measures 4.3 cm short axis. There are bulky left pelvic lymph nodes. Posteroinferior pelvic node, to the left of the lower sigmoid colon, measures 7.4 x 4.6 cm transversely. Adjacent left external iliac chain node measures 7.2 x 3.5 cm adenopathy surrounds the common iliac veins, greater on the left, as well as the left external iliac vein. It is not defined and may be occluded. No mesenteric adenopathy. No enlarged inguinal lymph nodes. Dense aortic atherosclerosis.  No aneurysm. Reproductive: Prostate is enlarged measuring 5.9 x 5.5 x 5.6 cm. Other: No abdominal wall hernia or abnormality. No abdominopelvic ascites. Musculoskeletal: No fracture or acute finding. No osteoblastic or osteolytic lesions. IMPRESSION: 1. There is bulky retroperitoneal and left pelvic lymphadenopathy that has developed since the prior CT. Adenopathy may occlude the left common and/or external iliac vein. Differential diagnosis includes lymphoma and other lymphoproliferative disorders. Consider the possibility of prostate carcinoma metastatic to lymph nodes. 2. No acute findings within the abdomen or pelvis. 3. Bilateral intrarenal stones. Dependent bladder stones as well as a small amount of dependent bladder debris. 4. Bilateral renal cortical thinning. 5. Aortic atherosclerosis. Electronically Signed   By: Lajean Manes M.D.   On: 07/24/2019 20:21   Dg Abd Acute 2+v W 1v Chest  Result Date:  07/24/2019 CLINICAL DATA:  Fall with hip pain EXAM: DG ABDOMEN ACUTE W/ 1V CHEST COMPARISON:  11/16/2016 chest x-ray FINDINGS: Single-view chest demonstrate surgical hardware in the lower cervical spine. No focal opacity or pleural effusion. Normal heart size. No pneumothorax. Supine and upright views of the abdomen demonstrate no free air beneath the diaphragm. Nonobstructed gas pattern with moderate stool. No radiopaque calculi. Phleboliths in the pelvis IMPRESSION: Negative abdominal radiographs.  No acute cardiopulmonary disease. Electronically Signed   By: Donavan Foil M.D.   On: 07/24/2019 15:37   Dg Hip Unilat W Or Wo Pelvis 2-3 Views Left  Result Date: 07/24/2019 CLINICAL DATA:  Fall with left hip pain EXAM: DG HIP (WITH OR WITHOUT PELVIS) 2-3V LEFT COMPARISON:  11/16/2016, CT 06/09/2011 FINDINGS: SI joints are non widened.  Pubic symphysis and rami appear intact. No fracture or malalignment. Mild degenerative changes of the left hip with mild joint space narrowing and osteophytosis at the left femoral head neck junction. IMPRESSION: No acute osseous abnormality Electronically Signed   By: Donavan Foil M.D.   On: 07/24/2019 15:36       Assessment & Plan:    Active Problems:   Hypercalcemia   1. Recurrent falls-patient complains of worsening generalized weakness, although imaging studies were negative for acute injury.  Likely from underlying malignancy as seen on the CT abdomen patient has multiple retroperitoneal and pelvic lymphadenopathy.  Also has hypercalcemia.  Start gentle IV hydration with normal saline.  Will obtain PT OT evaluation in a.m.  2. Retroperitoneal/pelvic ymphadenopathy-CT abdomen pelvis shows retroperitoneal/pelvic lymphadenopathy, concerning for lymphoproliferative disorder versus metastatic prostate cancer.  Will obtain PSA, SPEP.  Consult oncology in a.m. for work-up for lymphoma.  3. Hypercalcemia-calcium is 10.8, corrected calcium 11.4.  Likely from underlying  liver menses as above.  Will not check PTH rp.  Started on gentle IV hydration as above.  Follow serum calcium in a.m.  4. Acute kidney injury-patient's creatinine is 1.64, previous creatinine 1.07 in 2018.  CT abdomen shows intrarenal and bladder stones.  Will obtain renal ultrasound in a.m.  Continue IV normal saline as above.  5. Diabetes mellitus type 2-hold oral hypoglycemic agents.  Will start sliding scale insulin with NovoLog.  6. History of tremor-continue Mysoline, Topamax  7. Hypertension-continue propranolol.  Blood pressure is controlled.    DVT Prophylaxis-   Lovenox   AM Labs Ordered, also please review Full Orders  Family Communication: Admission, patients condition and plan of care including tests being ordered have been discussed with the patient who indicate understanding and agree with the plan and Code Status.  Code Status: DNR  Admission status: Observation: Based on patients clinical presentation and evaluation of above clinical data, I have made determination that patient meets Inpatient criteria at this time.  Time spent in minutes : 60 minutes   Oswald Hillock M.D on 07/24/2019 at 11:38 PM

## 2019-07-24 NOTE — ED Notes (Signed)
CONTACT INFO:  Cilas Leroy (son): [phone]

## 2019-07-24 NOTE — ED Triage Notes (Signed)
Ell in tub this am.hit head.  C/o weakness for last 4 weeks.  C/o pain to both legs,>in left VSS.  CBG 142.

## 2019-07-24 NOTE — ED Notes (Signed)
Pt unable to give urine sample at this time. Pt given urinal at bedside.

## 2019-07-24 NOTE — ED Provider Notes (Signed)
[facility] EMERGENCY DEPARTMENT Provider Note   CSN: [id number] Arrival date & time: 07/24/19  1316     History   Chief Complaint Chief Complaint  Patient presents with   Fall    HPI Daniel Richards is a 73 y.o. male with a history of DM, htn, longstanding history of tremor, history of generalized weakness presenting with increasing generalized weakness along with frequent falls, describing he fell yesterday when his son was not home, had to lay in the floor for several hours before he was helped up, then again today, fell backward in the bathtub, grazing his head on the side of the tub, but denies significant head injury, loc or headache since the event.  He states his legs simply give out causing his falls.  He describes severe pain in his left hip since todays fall, but endorses chronic bilateral lower leg pain (localized to pretibial area).  Reports reduced appetite, denies fevers, chills, n/v, but does report worsening  low abdominal pain with frequent bloating over the past two weeks. He has problems with constipation, with last full bm one week ago.    The history is provided by the patient.    Past Medical History:  Diagnosis Date   Anxiety and depression    Diabetes mellitus 06/09/2011   pt taking metformin   DJD of shoulder    right    Hyperlipidemia    Hypertension    Migraines    Tremor    Vitamin D deficiency     Patient Active Problem List   Diagnosis Date Noted   Hypercalcemia 07/24/2019   B12 deficiency 11/18/2016   Weakness generalized 11/16/2016   Altered mental status, unspecified 11/16/2016   Fracture, rib 11/16/2016   Weakness 11/16/2016   New onset of headaches after age 89 09/01/2015   Morning headache 09/01/2015   Daytime somnolence 09/01/2015   Essential tremor 09/01/2015   Intractable headache 09/01/2015    Past Surgical History:  Procedure Laterality Date   BACK SURGERY     KNEE SURGERY     SHOULDER SURGERY           Home Medications    Prior to Admission medications   Medication Sig Start Date End Date Taking? Authorizing Provider  aspirin EC 325 MG tablet Take 325 mg by mouth every evening.    [provider]  calcitRIOL (ROCALTROL) 0.5 MCG capsule Take 1 capsule by mouth daily. 05/20/19   [provider]  citalopram (CELEXA) 20 MG tablet Take 20 mg by mouth at bedtime.    [provider]  gabapentin (NEURONTIN) 300 MG capsule Take 600 mg by mouth 3 (three) times daily.     [provider]  HYDROcodone-acetaminophen (NORCO) 10-325 MG tablet Take 1 tablet by mouth 4 (four) times daily as needed. 05/27/19   [provider]  INVOKANA 100 MG TABS tablet Take 1 tablet by mouth daily. 07/17/19   [provider]  lisinopril (PRINIVIL,ZESTRIL) 20 MG tablet Take 20 mg by mouth at bedtime.     [provider]  metFORMIN (GLUCOPHAGE) 500 MG tablet Take 1 tablet by mouth daily. 07/02/19   [provider]  primidone (MYSOLINE) 50 MG tablet Take 1.5 tablets (75 mg total) by mouth every 8 (eight) hours. 11/18/16   Isaac Bliss, Rayford Halsted, MD  propranolol (INDERAL) 40 MG tablet Take 40 mg by mouth 2 (two) times daily.    [provider]  tamsulosin (FLOMAX) 0.4 MG CAPS capsule Take 1  capsule by mouth daily. 07/02/19   [provider]  topiramate (TOPAMAX) 100 MG tablet Take 200 mg by mouth at bedtime.    [provider]    Family History Family History  Problem Relation Age of Onset   Cancer Sister    Cancer Sister    Cancer Brother    Migraines Neg Hx     Social History Social History   Tobacco Use   Smoking status: Never Smoker   Smokeless tobacco: Never Used  Substance Use Topics   Alcohol use: No   Drug use: No     Allergies   Patient has no known allergies.   Review of Systems Review of Systems  Constitutional: Negative for chills and fever.  HENT: Negative for congestion and  sore throat.   Eyes: Negative.   Respiratory: Negative for chest tightness and shortness of breath.   Cardiovascular: Negative for chest pain.  Gastrointestinal: Positive for abdominal distention, abdominal pain and constipation. Negative for nausea and vomiting.  Genitourinary: Negative.   Musculoskeletal: Positive for arthralgias. Negative for joint swelling and neck pain.  Skin: Negative.  Negative for rash and wound.  Neurological: Positive for weakness. Negative for dizziness, light-headedness, numbness and headaches.  Psychiatric/Behavioral: Negative.      Physical Exam Updated Vital Signs BP 119/76 (BP Location: Right Arm)    Pulse 77    Temp 98.5 F (36.9 C) (Oral)    Resp 18    Ht 6' (1.829 m)    Wt 86.8 kg    SpO2 96%    BMI 25.95 kg/m   Physical Exam Vitals signs and nursing note reviewed.  Constitutional:      Appearance: He is well-developed.  HENT:     Head: Normocephalic and atraumatic.     Right Ear: Tympanic membrane normal.     Left Ear: Tympanic membrane normal.  Eyes:     Extraocular Movements: Extraocular movements intact.     Conjunctiva/sclera: Conjunctivae normal.     Pupils: Pupils are equal, round, and reactive to light.  Neck:     Musculoskeletal: Normal range of motion. No muscular tenderness.  Cardiovascular:     Rate and Rhythm: Normal rate and regular rhythm.     Heart sounds: Normal heart sounds.  Pulmonary:     Effort: Pulmonary effort is normal.     Breath sounds: Normal breath sounds. No wheezing.  Abdominal:     General: Bowel sounds are normal.     Palpations: Abdomen is soft.     Tenderness: There is abdominal tenderness. There is no guarding.     Comments: TTP bilateral lower abdomen.  Increased tympany to percussion, soft.  No guarding.   Musculoskeletal: Normal range of motion.  Skin:    General: Skin is warm and dry.  Neurological:     General: No focal deficit present.     Mental Status: He is alert and oriented to person,  place, and time.     Cranial Nerves: Cranial nerves are intact.     Comments: Equal grip strength.      ED Treatments / Results  Labs (all labs ordered are listed, but only abnormal results are displayed) Labs Reviewed  URINALYSIS, ROUTINE W REFLEX MICROSCOPIC - Abnormal; Notable for the following components:      Result Value   Hgb urine dipstick TRACE (*)    Bilirubin Urine SMALL (*)    Protein, ur TRACE (*)    All other components within normal limits  CBC WITH DIFFERENTIAL/PLATELET - Abnormal; Notable for the following components:   RBC 3.07 (*)    Hemoglobin 10.1 (*)    HCT 32.2 (*)    MCV 104.9 (*)    RDW 16.1 (*)    All other components within normal limits  COMPREHENSIVE METABOLIC PANEL - Abnormal; Notable for the following components:   Glucose, Bld 113 (*)    BUN 24 (*)    Creatinine, Ser 1.64 (*)    Calcium 10.8 (*)    Albumin 3.3 (*)    GFR calc non Af Amer 40 (*)    GFR calc Af Amer 47 (*)    All other components within normal limits  CK - Abnormal; Notable for the following components:   Total CK 34 (*)    All other components within normal limits  URINALYSIS, MICROSCOPIC (REFLEX) - Abnormal; Notable for the following components:   Bacteria, UA RARE (*)    All other components within normal limits  VITAMIN B12 - Abnormal; Notable for the following components:   Vitamin B-12 125 (*)    All other components within normal limits  FOLATE - Abnormal; Notable for the following components:   Folate 5.0 (*)    All other components within normal limits  IRON AND TIBC - Abnormal; Notable for the following components:   Iron 38 (*)    TIBC 214 (*)    All other components within normal limits  FERRITIN - Abnormal; Notable for the following components:   Ferritin 373 (*)    All other components within normal limits  RETICULOCYTES - Abnormal; Notable for the following components:   RBC. 3.11 (*)    All other components within normal limits  CBC - Abnormal;  Notable for the following components:   WBC 3.8 (*)    RBC 2.87 (*)    Hemoglobin 9.3 (*)    HCT 29.8 (*)    MCV 103.8 (*)    RDW 15.9 (*)    All other components within normal limits  COMPREHENSIVE METABOLIC PANEL - Abnormal; Notable for the following components:   Glucose, Bld 106 (*)    Creatinine, Ser 1.52 (*)    Albumin 3.0 (*)    GFR calc non Af Amer 44 (*)    GFR calc Af Amer 51 (*)    All other components within normal limits  GLUCOSE, CAPILLARY - Abnormal; Notable for the following components:   Glucose-Capillary 105 (*)    All other components within normal limits  CBG MONITORING, ED - Abnormal; Notable for the following components:   Glucose-Capillary 119 (*)    All other components within normal limits  SARS CORONAVIRUS 2 (TAT 6-24 HRS)  LIPASE, BLOOD  HEMOGLOBIN A1C  PROTEIN ELECTROPHORESIS, SERUM  PSA    EKG None  Radiology Ct Head Wo Contrast  Result Date: 07/24/2019 CLINICAL DATA:  Trauma. EXAM: CT HEAD WITHOUT CONTRAST CT CERVICAL SPINE WITHOUT CONTRAST TECHNIQUE: Multidetector CT imaging of the head and cervical spine was performed following the standard protocol without intravenous contrast. Multiplanar CT image reconstructions of the cervical spine were also generated. COMPARISON:  CT scan of November 16, 2016. FINDINGS: CT HEAD FINDINGS Brain: Mild chronic ischemic white matter disease is noted. No mass effect or midline shift is noted. Ventricular size is within normal limits. There is no evidence of mass lesion, hemorrhage or acute infarction. Vascular: No hyperdense vessel or unexpected calcification. Skull: Normal. Negative for fracture or focal lesion. Sinuses/Orbits: Right frontal and ethmoid mucosal thickening  is noted. Other: None. CT CERVICAL SPINE FINDINGS Alignment: Normal. Skull base and vertebrae: No acute fracture. No primary bone lesion or focal pathologic process. Soft tissues and spinal canal: No prevertebral fluid or swelling. No visible canal  hematoma. Disc levels: Status post surgical anterior fusion of C5-6. Mild anterior osteophyte formation is also noted at C4-5 and C6-7. Upper chest: Negative. Other: None. IMPRESSION: Mild chronic ischemic white matter disease. No acute intracranial abnormality seen. Postsurgical and degenerative changes are noted in the cervical spine. No fracture or significant spondylolisthesis is noted. Electronically Signed   By: Marijo Conception M.D.   On: 07/24/2019 16:20   Ct Cervical Spine Wo Contrast  Result Date: 07/24/2019 CLINICAL DATA:  Trauma. EXAM: CT HEAD WITHOUT CONTRAST CT CERVICAL SPINE WITHOUT CONTRAST TECHNIQUE: Multidetector CT imaging of the head and cervical spine was performed following the standard protocol without intravenous contrast. Multiplanar CT image reconstructions of the cervical spine were also generated. COMPARISON:  CT scan of November 16, 2016. FINDINGS: CT HEAD FINDINGS Brain: Mild chronic ischemic white matter disease is noted. No mass effect or midline shift is noted. Ventricular size is within normal limits. There is no evidence of mass lesion, hemorrhage or acute infarction. Vascular: No hyperdense vessel or unexpected calcification. Skull: Normal. Negative for fracture or focal lesion. Sinuses/Orbits: Right frontal and ethmoid mucosal thickening is noted. Other: None. CT CERVICAL SPINE FINDINGS Alignment: Normal. Skull base and vertebrae: No acute fracture. No primary bone lesion or focal pathologic process. Soft tissues and spinal canal: No prevertebral fluid or swelling. No visible canal hematoma. Disc levels: Status post surgical anterior fusion of C5-6. Mild anterior osteophyte formation is also noted at C4-5 and C6-7. Upper chest: Negative. Other: None. IMPRESSION: Mild chronic ischemic white matter disease. No acute intracranial abnormality seen. Postsurgical and degenerative changes are noted in the cervical spine. No fracture or significant spondylolisthesis is noted.  Electronically Signed   By: Marijo Conception M.D.   On: 07/24/2019 16:20   Ct Abdomen Pelvis W Contrast  Result Date: 07/24/2019 CLINICAL DATA:  Bilateral leg weakness. Patient fell in the bathtub today. EXAM: CT ABDOMEN AND PELVIS WITH CONTRAST TECHNIQUE: Multidetector CT imaging of the abdomen and pelvis was performed using the standard protocol following bolus administration of intravenous contrast. CONTRAST:  50mL OMNIPAQUE IOHEXOL 300 MG/ML SOLN, 42mL OMNIPAQUE IOHEXOL 300 MG/ML SOLN COMPARISON:  06/09/2011 FINDINGS: Lower chest: Minor linear atelectasis or scarring in the anterior lung bases. Lung bases otherwise clear. Heart normal in size. Hepatobiliary: No focal liver abnormality is seen. No gallstones, gallbladder wall thickening, or biliary dilatation. Pancreas: Unremarkable. No pancreatic ductal dilatation or surrounding inflammatory changes. Spleen: Normal in size without focal abnormality. Adrenals/Urinary Tract: No adrenal masses. Kidneys are normal in position. There is bilateral renal cortical thinning. 18 mm low-attenuation mass, posterior midpole the left kidney, consistent with a cyst. No other renal masses. Small nonobstructing stones in both kidneys. No hydronephrosis. Ureters normal in course and in caliber. No ureteral stones. Bladder moderately distended. There are dependent stones with some mild increased attenuation in the dependent bladder is well, likely debris. No wall thickening or convincing mass. Stomach/Bowel: Stomach is unremarkable. Small bowel and colon are normal in caliber. No wall thickening. No inflammation. Mild increased colonic stool burden. No evidence of appendicitis. Appendix not visualized. Vascular/Lymphatic: There is extensive adenopathy. Bulky retroperitoneal adenopathy is noted extending from level of the adrenal glands into the pelvis. Node at the level of the midpole the left kidney, left periaortic,  measures 4.3 cm in short axis. Left periaortic node at the  level of the bifurcation measures 4.3 cm short axis. There are bulky left pelvic lymph nodes. Posteroinferior pelvic node, to the left of the lower sigmoid colon, measures 7.4 x 4.6 cm transversely. Adjacent left external iliac chain node measures 7.2 x 3.5 cm adenopathy surrounds the common iliac veins, greater on the left, as well as the left external iliac vein. It is not defined and may be occluded. No mesenteric adenopathy. No enlarged inguinal lymph nodes. Dense aortic atherosclerosis.  No aneurysm. Reproductive: Prostate is enlarged measuring 5.9 x 5.5 x 5.6 cm. Other: No abdominal wall hernia or abnormality. No abdominopelvic ascites. Musculoskeletal: No fracture or acute finding. No osteoblastic or osteolytic lesions. IMPRESSION: 1. There is bulky retroperitoneal and left pelvic lymphadenopathy that has developed since the prior CT. Adenopathy may occlude the left common and/or external iliac vein. Differential diagnosis includes lymphoma and other lymphoproliferative disorders. Consider the possibility of prostate carcinoma metastatic to lymph nodes. 2. No acute findings within the abdomen or pelvis. 3. Bilateral intrarenal stones. Dependent bladder stones as well as a small amount of dependent bladder debris. 4. Bilateral renal cortical thinning. 5. Aortic atherosclerosis. Electronically Signed   By: Lajean Manes M.D.   On: 07/24/2019 20:21   Dg Abd Acute 2+v W 1v Chest  Result Date: 07/24/2019 CLINICAL DATA:  Fall with hip pain EXAM: DG ABDOMEN ACUTE W/ 1V CHEST COMPARISON:  11/16/2016 chest x-ray FINDINGS: Single-view chest demonstrate surgical hardware in the lower cervical spine. No focal opacity or pleural effusion. Normal heart size. No pneumothorax. Supine and upright views of the abdomen demonstrate no free air beneath the diaphragm. Nonobstructed gas pattern with moderate stool. No radiopaque calculi. Phleboliths in the pelvis IMPRESSION: Negative abdominal radiographs.  No acute  cardiopulmonary disease. Electronically Signed   By: Donavan Foil M.D.   On: 07/24/2019 15:37   Dg Hip Unilat W Or Wo Pelvis 2-3 Views Left  Result Date: 07/24/2019 CLINICAL DATA:  Fall with left hip pain EXAM: DG HIP (WITH OR WITHOUT PELVIS) 2-3V LEFT COMPARISON:  11/16/2016, CT 06/09/2011 FINDINGS: SI joints are non widened. Pubic symphysis and rami appear intact. No fracture or malalignment. Mild degenerative changes of the left hip with mild joint space narrowing and osteophytosis at the left femoral head neck junction. IMPRESSION: No acute osseous abnormality Electronically Signed   By: Donavan Foil M.D.   On: 07/24/2019 15:36    Procedures Procedures (including critical care time)  Medications Ordered in ED Medications  HYDROcodone-acetaminophen (NORCO) 10-325 MG per tablet 1 tablet (1 tablet Oral Given 07/25/19 0238)  propranolol (INDERAL) tablet 40 mg (40 mg Oral Given 07/25/19 0238)  citalopram (CELEXA) tablet 20 mg (has no administration in time range)  calcitRIOL (ROCALTROL) capsule 0.5 mcg (has no administration in time range)  tamsulosin (FLOMAX) capsule 0.4 mg (has no administration in time range)  gabapentin (NEURONTIN) capsule 600 mg (has no administration in time range)  primidone (MYSOLINE) tablet 75 mg (75 mg Oral Given 07/25/19 0623)  topiramate (TOPAMAX) tablet 200 mg (has no administration in time range)  enoxaparin (LOVENOX) injection 40 mg (has no administration in time range)  0.9 %  sodium chloride infusion ( Intravenous New Bag/Given 07/25/19 0242)  ondansetron (ZOFRAN) tablet 4 mg (has no administration in time range)    Or  ondansetron (ZOFRAN) injection 4 mg (has no administration in time range)  hydrALAZINE (APRESOLINE) tablet 25 mg (has no administration in time range)  insulin aspart (novoLOG) injection 0-9 Units (has no administration in time range)  iohexol (OMNIPAQUE) 300 MG/ML solution 30 mL (30 mLs Oral Contrast Given 07/24/19 1740)  iohexol (OMNIPAQUE)  300 MG/ML solution 100 mL (75 mLs Intravenous Contrast Given 07/24/19 1940)  sodium chloride 0.9 % bolus 500 mL (0 mLs Intravenous Stopped 07/24/19 2245)  morphine 4 MG/ML injection 4 mg (4 mg Intravenous Given 07/24/19 2150)     Initial Impression / Assessment and Plan / ED Course  I have reviewed the triage vital signs and the nursing notes.  Pertinent labs & imaging results that were available during my care of the patient were reviewed by me and considered in my medical decision making (see chart for details).        Pt with generalized increased weakness with frequent falls, no fractures or intracranial injury based on CT /plain films.  Also with lower abdominal pain/bloating/abdominal distention past several weeks.  Plain imaging negative for acute obstructive pattern.  Pt ttp lower abdomen, CT imaging pending.    Discussed with Alyse Low who assumes care.    Final Clinical Impressions(s) / ED Diagnoses   Final diagnoses:  Falls frequently  Acute kidney injury [facility])  Generalized abdominal pain  Intra-abdominal lymphadenopathy    ED Discharge Orders    None       Landis Martins 07/25/19 0911    Davonna Belling, MD 07/25/19 2037

## 2019-07-24 NOTE — ED Notes (Signed)
Gave pt a decaf coffee and a meal.

## 2019-07-25 ENCOUNTER — Other Ambulatory Visit (HOSPITAL_COMMUNITY): Payer: Self-pay | Admitting: Nurse Practitioner

## 2019-07-25 ENCOUNTER — Observation Stay (HOSPITAL_COMMUNITY): Payer: Medicare Other

## 2019-07-25 ENCOUNTER — Inpatient Hospital Stay (HOSPITAL_COMMUNITY): Payer: Medicare Other

## 2019-07-25 ENCOUNTER — Encounter (HOSPITAL_COMMUNITY): Payer: Self-pay

## 2019-07-25 ENCOUNTER — Other Ambulatory Visit (HOSPITAL_COMMUNITY): Payer: Self-pay | Admitting: Hematology

## 2019-07-25 ENCOUNTER — Other Ambulatory Visit: Payer: Self-pay

## 2019-07-25 DIAGNOSIS — R531 Weakness: Secondary | ICD-10-CM | POA: Diagnosis not present

## 2019-07-25 DIAGNOSIS — R339 Retention of urine, unspecified: Secondary | ICD-10-CM | POA: Diagnosis present

## 2019-07-25 DIAGNOSIS — R590 Localized enlarged lymph nodes: Secondary | ICD-10-CM | POA: Diagnosis not present

## 2019-07-25 DIAGNOSIS — C8333 Diffuse large B-cell lymphoma, intra-abdominal lymph nodes: Secondary | ICD-10-CM | POA: Diagnosis present

## 2019-07-25 DIAGNOSIS — Z20828 Contact with and (suspected) exposure to other viral communicable diseases: Secondary | ICD-10-CM | POA: Diagnosis present

## 2019-07-25 DIAGNOSIS — R251 Tremor, unspecified: Secondary | ICD-10-CM | POA: Diagnosis present

## 2019-07-25 DIAGNOSIS — Z7984 Long term (current) use of oral hypoglycemic drugs: Secondary | ICD-10-CM | POA: Diagnosis not present

## 2019-07-25 DIAGNOSIS — I2510 Atherosclerotic heart disease of native coronary artery without angina pectoris: Secondary | ICD-10-CM | POA: Diagnosis present

## 2019-07-25 DIAGNOSIS — I129 Hypertensive chronic kidney disease with stage 1 through stage 4 chronic kidney disease, or unspecified chronic kidney disease: Secondary | ICD-10-CM | POA: Diagnosis present

## 2019-07-25 DIAGNOSIS — R1084 Generalized abdominal pain: Secondary | ICD-10-CM | POA: Diagnosis not present

## 2019-07-25 DIAGNOSIS — E538 Deficiency of other specified B group vitamins: Secondary | ICD-10-CM | POA: Diagnosis not present

## 2019-07-25 DIAGNOSIS — Z7189 Other specified counseling: Secondary | ICD-10-CM | POA: Diagnosis not present

## 2019-07-25 DIAGNOSIS — D509 Iron deficiency anemia, unspecified: Secondary | ICD-10-CM | POA: Diagnosis present

## 2019-07-25 DIAGNOSIS — Z7982 Long term (current) use of aspirin: Secondary | ICD-10-CM | POA: Diagnosis not present

## 2019-07-25 DIAGNOSIS — R296 Repeated falls: Secondary | ICD-10-CM | POA: Diagnosis present

## 2019-07-25 DIAGNOSIS — E785 Hyperlipidemia, unspecified: Secondary | ICD-10-CM | POA: Diagnosis present

## 2019-07-25 DIAGNOSIS — Z801 Family history of malignant neoplasm of trachea, bronchus and lung: Secondary | ICD-10-CM | POA: Diagnosis not present

## 2019-07-25 DIAGNOSIS — G250 Essential tremor: Secondary | ICD-10-CM | POA: Diagnosis present

## 2019-07-25 DIAGNOSIS — E440 Moderate protein-calorie malnutrition: Secondary | ICD-10-CM | POA: Diagnosis present

## 2019-07-25 DIAGNOSIS — E869 Volume depletion, unspecified: Secondary | ICD-10-CM | POA: Diagnosis present

## 2019-07-25 DIAGNOSIS — Z515 Encounter for palliative care: Secondary | ICD-10-CM | POA: Diagnosis not present

## 2019-07-25 DIAGNOSIS — E1122 Type 2 diabetes mellitus with diabetic chronic kidney disease: Secondary | ICD-10-CM | POA: Diagnosis present

## 2019-07-25 DIAGNOSIS — N179 Acute kidney failure, unspecified: Secondary | ICD-10-CM | POA: Diagnosis present

## 2019-07-25 DIAGNOSIS — Z79899 Other long term (current) drug therapy: Secondary | ICD-10-CM | POA: Diagnosis not present

## 2019-07-25 DIAGNOSIS — Z66 Do not resuscitate: Secondary | ICD-10-CM | POA: Diagnosis present

## 2019-07-25 DIAGNOSIS — Z87891 Personal history of nicotine dependence: Secondary | ICD-10-CM | POA: Diagnosis not present

## 2019-07-25 DIAGNOSIS — R1310 Dysphagia, unspecified: Secondary | ICD-10-CM | POA: Diagnosis present

## 2019-07-25 DIAGNOSIS — N183 Chronic kidney disease, stage 3 (moderate): Secondary | ICD-10-CM | POA: Diagnosis present

## 2019-07-25 LAB — IRON AND TIBC
Iron: 38 ug/dL — ABNORMAL LOW (ref 45–182)
Saturation Ratios: 18 % (ref 17.9–39.5)
TIBC: 214 ug/dL — ABNORMAL LOW (ref 250–450)
UIBC: 176 ug/dL

## 2019-07-25 LAB — RETICULOCYTES
Immature Retic Fract: 8.2 % (ref 2.3–15.9)
RBC.: 3.11 MIL/uL — ABNORMAL LOW (ref 4.22–5.81)
Retic Count, Absolute: 45.7 10*3/uL (ref 19.0–186.0)
Retic Ct Pct: 1.5 % (ref 0.4–3.1)

## 2019-07-25 LAB — SEDIMENTATION RATE: Sed Rate: 58 mm/hr — ABNORMAL HIGH (ref 0–16)

## 2019-07-25 LAB — COMPREHENSIVE METABOLIC PANEL
ALT: 15 U/L (ref 0–44)
AST: 16 U/L (ref 15–41)
Albumin: 3 g/dL — ABNORMAL LOW (ref 3.5–5.0)
Alkaline Phosphatase: 72 U/L (ref 38–126)
Anion gap: 8 (ref 5–15)
BUN: 22 mg/dL (ref 8–23)
CO2: 25 mmol/L (ref 22–32)
Calcium: 9.9 mg/dL (ref 8.9–10.3)
Chloride: 102 mmol/L (ref 98–111)
Creatinine, Ser: 1.52 mg/dL — ABNORMAL HIGH (ref 0.61–1.24)
GFR calc Af Amer: 51 mL/min — ABNORMAL LOW (ref >60)
GFR calc non Af Amer: 44 mL/min — ABNORMAL LOW (ref >60)
Glucose, Bld: 106 mg/dL — ABNORMAL HIGH (ref 70–99)
Potassium: 3.8 mmol/L (ref 3.5–5.1)
Sodium: 135 mmol/L (ref 135–145)
Total Bilirubin: 0.5 mg/dL (ref 0.3–1.2)
Total Protein: 6.6 g/dL (ref 6.5–8.1)

## 2019-07-25 LAB — GLUCOSE, CAPILLARY
Glucose-Capillary: 105 mg/dL — ABNORMAL HIGH (ref 70–99)
Glucose-Capillary: 149 mg/dL — ABNORMAL HIGH (ref 70–99)
Glucose-Capillary: 216 mg/dL — ABNORMAL HIGH (ref 70–99)

## 2019-07-25 LAB — CBC
HCT: 29.8 % — ABNORMAL LOW (ref 39.0–52.0)
Hemoglobin: 9.3 g/dL — ABNORMAL LOW (ref 13.0–17.0)
MCH: 32.4 pg (ref 26.0–34.0)
MCHC: 31.2 g/dL (ref 30.0–36.0)
MCV: 103.8 fL — ABNORMAL HIGH (ref 80.0–100.0)
Platelets: 179 10*3/uL (ref 150–400)
RBC: 2.87 MIL/uL — ABNORMAL LOW (ref 4.22–5.81)
RDW: 15.9 % — ABNORMAL HIGH (ref 11.5–15.5)
WBC: 3.8 10*3/uL — ABNORMAL LOW (ref 4.0–10.5)
nRBC: 0 % (ref 0.0–0.2)

## 2019-07-25 LAB — PSA: Prostatic Specific Antigen: 3.08 ng/mL (ref 0.00–4.00)

## 2019-07-25 LAB — VITAMIN B12: Vitamin B-12: 125 pg/mL — ABNORMAL LOW (ref 180–914)

## 2019-07-25 LAB — FERRITIN: Ferritin: 373 ng/mL — ABNORMAL HIGH (ref 24–336)

## 2019-07-25 LAB — FOLATE: Folate: 5 ng/mL — ABNORMAL LOW (ref >5.9)

## 2019-07-25 LAB — LACTATE DEHYDROGENASE: LDH: 107 U/L (ref 98–192)

## 2019-07-25 LAB — SARS CORONAVIRUS 2 (TAT 6-24 HRS): SARS Coronavirus 2: NEGATIVE

## 2019-07-25 MED ORDER — SODIUM CHLORIDE 0.9 % IV SOLN
INTRAVENOUS | Status: DC
  Administered 2019-07-25 – 2019-07-29 (×5): via INTRAVENOUS

## 2019-07-25 MED ORDER — HYDROCODONE-ACETAMINOPHEN 10-325 MG PO TABS
1.0000 | ORAL_TABLET | Freq: Four times a day (QID) | ORAL | Status: DC | PRN
  Administered 2019-07-25 – 2019-07-27 (×6): 1 via ORAL
  Filled 2019-07-25 (×6): qty 1

## 2019-07-25 MED ORDER — HYDRALAZINE HCL 25 MG PO TABS: 25.0000 mg | ORAL_TABLET | Freq: Four times a day (QID) | ORAL | Status: DC | PRN

## 2019-07-25 MED ORDER — IOHEXOL 300 MG/ML  SOLN
60.0000 mL | Freq: Once | INTRAMUSCULAR | Status: AC | PRN
  Administered 2019-07-25: 60 mL via INTRAVENOUS

## 2019-07-25 MED ORDER — PROPRANOLOL HCL 20 MG PO TABS
40.0000 mg | ORAL_TABLET | Freq: Two times a day (BID) | ORAL | Status: DC
  Administered 2019-07-25 – 2019-08-01 (×15): 40 mg via ORAL
  Filled 2019-07-25 (×17): qty 2

## 2019-07-25 MED ORDER — TAMSULOSIN HCL 0.4 MG PO CAPS
0.4000 mg | ORAL_CAPSULE | Freq: Every day | ORAL | Status: DC
  Administered 2019-07-25 – 2019-08-01 (×8): 0.4 mg via ORAL
  Filled 2019-07-25 (×9): qty 1

## 2019-07-25 MED ORDER — ONDANSETRON HCL 4 MG/2ML IJ SOLN
4.0000 mg | Freq: Four times a day (QID) | INTRAMUSCULAR | Status: DC | PRN
  Administered 2019-07-27 – 2019-07-30 (×5): 4 mg via INTRAVENOUS
  Filled 2019-07-25 (×5): qty 2

## 2019-07-25 MED ORDER — ONDANSETRON HCL 4 MG PO TABS
4.0000 mg | ORAL_TABLET | Freq: Four times a day (QID) | ORAL | Status: DC | PRN
  Administered 2019-07-29: 4 mg via ORAL
  Filled 2019-07-25: qty 1

## 2019-07-25 MED ORDER — CALCITRIOL 0.25 MCG PO CAPS
0.5000 ug | ORAL_CAPSULE | Freq: Every day | ORAL | Status: DC
  Administered 2019-07-25: 0.5 ug via ORAL
  Filled 2019-07-25: qty 2

## 2019-07-25 MED ORDER — GABAPENTIN 300 MG PO CAPS
600.0000 mg | ORAL_CAPSULE | Freq: Three times a day (TID) | ORAL | Status: DC
  Administered 2019-07-25 – 2019-08-01 (×21): 600 mg via ORAL
  Filled 2019-07-25 (×23): qty 2

## 2019-07-25 MED ORDER — ENOXAPARIN SODIUM 40 MG/0.4ML ~~LOC~~ SOLN
40.0000 mg | SUBCUTANEOUS | Status: DC
  Administered 2019-07-25 – 2019-07-28 (×4): 40 mg via SUBCUTANEOUS
  Filled 2019-07-25 (×4): qty 0.4

## 2019-07-25 MED ORDER — TOPIRAMATE 100 MG PO TABS
200.0000 mg | ORAL_TABLET | Freq: Every day | ORAL | Status: DC
  Administered 2019-07-25 – 2019-07-31 (×7): 200 mg via ORAL
  Filled 2019-07-25 (×7): qty 2

## 2019-07-25 MED ORDER — ENSURE ENLIVE PO LIQD
237.0000 mL | Freq: Two times a day (BID) | ORAL | Status: DC
  Administered 2019-07-25 – 2019-07-30 (×6): 237 mL via ORAL

## 2019-07-25 MED ORDER — CITALOPRAM HYDROBROMIDE 20 MG PO TABS
20.0000 mg | ORAL_TABLET | Freq: Every day | ORAL | Status: DC
  Administered 2019-07-25 – 2019-07-31 (×7): 20 mg via ORAL
  Filled 2019-07-25 (×7): qty 1

## 2019-07-25 MED ORDER — ADULT MULTIVITAMIN W/MINERALS CH
1.0000 | ORAL_TABLET | Freq: Every day | ORAL | Status: DC
  Administered 2019-07-25 – 2019-08-01 (×7): 1 via ORAL
  Filled 2019-07-25 (×9): qty 1

## 2019-07-25 MED ORDER — PRIMIDONE 50 MG PO TABS
75.0000 mg | ORAL_TABLET | Freq: Three times a day (TID) | ORAL | Status: DC
  Administered 2019-07-25 – 2019-08-01 (×22): 75 mg via ORAL
  Filled 2019-07-25 (×23): qty 2

## 2019-07-25 MED ORDER — INSULIN ASPART 100 UNIT/ML ~~LOC~~ SOLN
0.0000 [IU] | Freq: Three times a day (TID) | SUBCUTANEOUS | Status: DC
  Administered 2019-07-25: 3 [IU] via SUBCUTANEOUS
  Administered 2019-07-25 – 2019-07-27 (×2): 1 [IU] via SUBCUTANEOUS
  Administered 2019-07-27: 2 [IU] via SUBCUTANEOUS
  Administered 2019-07-28 – 2019-07-31 (×8): 1 [IU] via SUBCUTANEOUS

## 2019-07-25 NOTE — Progress Notes (Signed)
Updated patient's daughter  [name] [phone] re: pt's condition, advised of oncology consult placed for Monday.

## 2019-07-25 NOTE — Progress Notes (Signed)
PROGRESS NOTE    Daniel Richards  [id number]  DOB: 1946/10/25  DOA: 07/24/2019 PCP: Scotty Court, DO   Brief Admission Hx: 73 year old gentleman with hypertension hyperlipidemia diabetes chronic resting tremor presented with frequent falls at home.  He also had complained of abdominal pain and CT shows some worrisome abdominal lymph nodes suggesting possible malignancy.  MDM/Assessment & Plan:   1. Generalized weakness with recurrent falling at home-patient has been seen by physical therapy and they are recommending SNF placement.  I have spoken with the transitions of care team and they are working on finding placement. 2. Retroperitoneal/pelvic lymphadenopathy- CT abdomen findings are worrisome for malignancy.  I spoke with oncology today and they have ordered flow cytometry, still waiting on PSA, unfortunately the location of the nodes make biopsy fairly dangerous and may not be able to be done.  They are going to ask IR to take a look.  Follow-up on outstanding tests.  Oncology will be able to see him on Monday. 3. Hypercalcemia- his calcium levels are correcting with IV fluid hydration.  Continue to monitor closely. 4. AKI-continue gentle hydration there has been some improvement in creatinine with fluids. 5. Type 2 diabetes mellitus-continue sliding scale coverage. 6. Chronic resting tremor-continue Mysoline and Topamax and propanolol. 7. Essential hypertension- continue propranolol for blood pressure control.   DVT prophylaxis: Lovenox Code Status: Full Family Communication:  Disposition Plan: Inpatient   Consultants:  Oncology  PT/OT  Procedures:    Antimicrobials:     Subjective: Patient without complaints this morning.  Objective: Vitals:   07/25/19 0000 07/25/19 0210 07/25/19 0558 07/25/19 1124  BP: 108/66 136/85 119/76 125/72  Pulse: (!) 101 90 77 90  Resp: 16 18 18 20   Temp:  99 F (37.2 C) 98.5 F (36.9 C) 98.9 F (37.2 C)  TempSrc:  Oral  Oral Oral  SpO2: 90% 99% 96% 98%  Weight:  86.8 kg    Height:  6' (1.829 m)      Intake/Output Summary (Last 24 hours) at 07/25/2019 1212 Last data filed at 07/25/2019 1200 Gross per 24 hour  Intake 1235.59 ml  Output 300 ml  Net 935.59 ml   Filed Weights   07/24/19 1329 07/25/19 0210  Weight: 90.7 kg 86.8 kg     REVIEW OF SYSTEMS  As per history otherwise all reviewed and reported negative  Exam:  General exam: Chronically ill-appearing male sitting up in the bed he is in no apparent distress he is cooperative.  He has a chronic resting tremor Respiratory system: Clear. No increased work of breathing. Cardiovascular system: S1 & S2 heard. No JVD, murmurs, gallops, clicks or pedal edema. Gastrointestinal system: Abdomen is nondistended, soft and generalized tenderness. Normal bowel sounds heard. Central nervous system: Alert and oriented. No focal neurological deficits. Extremities: no CCE.  Data Reviewed: Basic Metabolic Panel: Recent Labs  Lab 07/24/19 1551 07/25/19 0608  NA 138 135  K 3.9 3.8  CL 102 102  CO2 27 25  GLUCOSE 113* 106*  BUN 24* 22  CREATININE 1.64* 1.52*  CALCIUM 10.8* 9.9   Liver Function Tests: Recent Labs  Lab 07/24/19 1551 07/25/19 0608  AST 18 16  ALT 16 15  ALKPHOS 78 72  BILITOT 0.6 0.5  PROT 7.1 6.6  ALBUMIN 3.3* 3.0*   Recent Labs  Lab 07/24/19 1551  LIPASE 21   No results for input(s): AMMONIA in the last 168 hours. CBC: Recent Labs  Lab 07/24/19 1551 07/25/19 0608  WBC  4.0 3.8*  NEUTROABS 1.7  --   HGB 10.1* 9.3*  HCT 32.2* 29.8*  MCV 104.9* 103.8*  PLT 201 179   Cardiac Enzymes: Recent Labs  Lab 07/24/19 1551  CKTOTAL 34*   CBG (last 3)  Recent Labs    07/24/19 1438 07/25/19 0720 07/25/19 1110  GLUCAP 119* 105* 149*   No results found for this or any previous visit (from the past 240 hour(s)).   Studies: Ct Head Wo Contrast  Result Date: 07/24/2019 CLINICAL DATA:  Trauma. EXAM: CT HEAD WITHOUT  CONTRAST CT CERVICAL SPINE WITHOUT CONTRAST TECHNIQUE: Multidetector CT imaging of the head and cervical spine was performed following the standard protocol without intravenous contrast. Multiplanar CT image reconstructions of the cervical spine were also generated. COMPARISON:  CT scan of November 16, 2016. FINDINGS: CT HEAD FINDINGS Brain: Mild chronic ischemic white matter disease is noted. No mass effect or midline shift is noted. Ventricular size is within normal limits. There is no evidence of mass lesion, hemorrhage or acute infarction. Vascular: No hyperdense vessel or unexpected calcification. Skull: Normal. Negative for fracture or focal lesion. Sinuses/Orbits: Right frontal and ethmoid mucosal thickening is noted. Other: None. CT CERVICAL SPINE FINDINGS Alignment: Normal. Skull base and vertebrae: No acute fracture. No primary bone lesion or focal pathologic process. Soft tissues and spinal canal: No prevertebral fluid or swelling. No visible canal hematoma. Disc levels: Status post surgical anterior fusion of C5-6. Mild anterior osteophyte formation is also noted at C4-5 and C6-7. Upper chest: Negative. Other: None. IMPRESSION: Mild chronic ischemic white matter disease. No acute intracranial abnormality seen. Postsurgical and degenerative changes are noted in the cervical spine. No fracture or significant spondylolisthesis is noted. Electronically Signed   By: Marijo Conception M.D.   On: 07/24/2019 16:20   Ct Cervical Spine Wo Contrast  Result Date: 07/24/2019 CLINICAL DATA:  Trauma. EXAM: CT HEAD WITHOUT CONTRAST CT CERVICAL SPINE WITHOUT CONTRAST TECHNIQUE: Multidetector CT imaging of the head and cervical spine was performed following the standard protocol without intravenous contrast. Multiplanar CT image reconstructions of the cervical spine were also generated. COMPARISON:  CT scan of November 16, 2016. FINDINGS: CT HEAD FINDINGS Brain: Mild chronic ischemic white matter disease is noted. No mass  effect or midline shift is noted. Ventricular size is within normal limits. There is no evidence of mass lesion, hemorrhage or acute infarction. Vascular: No hyperdense vessel or unexpected calcification. Skull: Normal. Negative for fracture or focal lesion. Sinuses/Orbits: Right frontal and ethmoid mucosal thickening is noted. Other: None. CT CERVICAL SPINE FINDINGS Alignment: Normal. Skull base and vertebrae: No acute fracture. No primary bone lesion or focal pathologic process. Soft tissues and spinal canal: No prevertebral fluid or swelling. No visible canal hematoma. Disc levels: Status post surgical anterior fusion of C5-6. Mild anterior osteophyte formation is also noted at C4-5 and C6-7. Upper chest: Negative. Other: None. IMPRESSION: Mild chronic ischemic white matter disease. No acute intracranial abnormality seen. Postsurgical and degenerative changes are noted in the cervical spine. No fracture or significant spondylolisthesis is noted. Electronically Signed   By: Marijo Conception M.D.   On: 07/24/2019 16:20   Ct Abdomen Pelvis W Contrast  Result Date: 07/24/2019 CLINICAL DATA:  Bilateral leg weakness. Patient fell in the bathtub today. EXAM: CT ABDOMEN AND PELVIS WITH CONTRAST TECHNIQUE: Multidetector CT imaging of the abdomen and pelvis was performed using the standard protocol following bolus administration of intravenous contrast. CONTRAST:  86mL OMNIPAQUE IOHEXOL 300 MG/ML SOLN, 66mL OMNIPAQUE  IOHEXOL 300 MG/ML SOLN COMPARISON:  06/09/2011 FINDINGS: Lower chest: Minor linear atelectasis or scarring in the anterior lung bases. Lung bases otherwise clear. Heart normal in size. Hepatobiliary: No focal liver abnormality is seen. No gallstones, gallbladder wall thickening, or biliary dilatation. Pancreas: Unremarkable. No pancreatic ductal dilatation or surrounding inflammatory changes. Spleen: Normal in size without focal abnormality. Adrenals/Urinary Tract: No adrenal masses. Kidneys are normal in  position. There is bilateral renal cortical thinning. 18 mm low-attenuation mass, posterior midpole the left kidney, consistent with a cyst. No other renal masses. Small nonobstructing stones in both kidneys. No hydronephrosis. Ureters normal in course and in caliber. No ureteral stones. Bladder moderately distended. There are dependent stones with some mild increased attenuation in the dependent bladder is well, likely debris. No wall thickening or convincing mass. Stomach/Bowel: Stomach is unremarkable. Small bowel and colon are normal in caliber. No wall thickening. No inflammation. Mild increased colonic stool burden. No evidence of appendicitis. Appendix not visualized. Vascular/Lymphatic: There is extensive adenopathy. Bulky retroperitoneal adenopathy is noted extending from level of the adrenal glands into the pelvis. Node at the level of the midpole the left kidney, left periaortic, measures 4.3 cm in short axis. Left periaortic node at the level of the bifurcation measures 4.3 cm short axis. There are bulky left pelvic lymph nodes. Posteroinferior pelvic node, to the left of the lower sigmoid colon, measures 7.4 x 4.6 cm transversely. Adjacent left external iliac chain node measures 7.2 x 3.5 cm adenopathy surrounds the common iliac veins, greater on the left, as well as the left external iliac vein. It is not defined and may be occluded. No mesenteric adenopathy. No enlarged inguinal lymph nodes. Dense aortic atherosclerosis.  No aneurysm. Reproductive: Prostate is enlarged measuring 5.9 x 5.5 x 5.6 cm. Other: No abdominal wall hernia or abnormality. No abdominopelvic ascites. Musculoskeletal: No fracture or acute finding. No osteoblastic or osteolytic lesions. IMPRESSION: 1. There is bulky retroperitoneal and left pelvic lymphadenopathy that has developed since the prior CT. Adenopathy may occlude the left common and/or external iliac vein. Differential diagnosis includes lymphoma and other  lymphoproliferative disorders. Consider the possibility of prostate carcinoma metastatic to lymph nodes. 2. No acute findings within the abdomen or pelvis. 3. Bilateral intrarenal stones. Dependent bladder stones as well as a small amount of dependent bladder debris. 4. Bilateral renal cortical thinning. 5. Aortic atherosclerosis. Electronically Signed   By: Lajean Manes M.D.   On: 07/24/2019 20:21   Dg Abd Acute 2+v W 1v Chest  Result Date: 07/24/2019 CLINICAL DATA:  Fall with hip pain EXAM: DG ABDOMEN ACUTE W/ 1V CHEST COMPARISON:  11/16/2016 chest x-ray FINDINGS: Single-view chest demonstrate surgical hardware in the lower cervical spine. No focal opacity or pleural effusion. Normal heart size. No pneumothorax. Supine and upright views of the abdomen demonstrate no free air beneath the diaphragm. Nonobstructed gas pattern with moderate stool. No radiopaque calculi. Phleboliths in the pelvis IMPRESSION: Negative abdominal radiographs.  No acute cardiopulmonary disease. Electronically Signed   By: Donavan Foil M.D.   On: 07/24/2019 15:37   Dg Hip Unilat W Or Wo Pelvis 2-3 Views Left  Result Date: 07/24/2019 CLINICAL DATA:  Fall with left hip pain EXAM: DG HIP (WITH OR WITHOUT PELVIS) 2-3V LEFT COMPARISON:  11/16/2016, CT 06/09/2011 FINDINGS: SI joints are non widened. Pubic symphysis and rami appear intact. No fracture or malalignment. Mild degenerative changes of the left hip with mild joint space narrowing and osteophytosis at the left femoral head neck junction. IMPRESSION:  No acute osseous abnormality Electronically Signed   By: Donavan Foil M.D.   On: 07/24/2019 15:36     Scheduled Meds: . calcitRIOL  0.5 mcg Oral Daily  . citalopram  20 mg Oral QHS  . enoxaparin (LOVENOX) injection  40 mg Subcutaneous Q24H  . gabapentin  600 mg Oral TID  . insulin aspart  0-9 Units Subcutaneous TID WC  . primidone  75 mg Oral Q8H  . propranolol  40 mg Oral BID  . tamsulosin  0.4 mg Oral Daily  .  topiramate  200 mg Oral QHS   Continuous Infusions: . sodium chloride Stopped (07/25/19 1119)    Active Problems:   Hypercalcemia   Generalized weakness  Time spent:   Irwin Brakeman, MD [facility] 07/25/2019, 12:12 PM    LOS: 0 days  How to contact the [facility] Attending or Consulting provider [place] or covering provider during after hours [place], for this patient?  1. Check the care team in [facility] and look for a) attending/consulting TR[facility] provider listed and b) the [facility] team listed 2. Log into [email or web address] and use [facility] universal password to access. If you do not have the password, please contact the hospital operator. 3. Locate the [facility] provider you are looking for under [facility] and page to a number that you can be directly reached. 4. If you still have difficulty reaching the provider, please page the [facility] (Director on Call) for the Hospitalists listed on amion for assistance.

## 2019-07-25 NOTE — Progress Notes (Signed)
Initial Nutrition Assessment  DOCUMENTATION CODES:   Non-severe (moderate) malnutrition in context of chronic illness  INTERVENTION:  -Ensure Enlive po BID, each supplement provides 350 kcal and 20 grams of protein (chocolate)  -Magic cup TID with meals, each supplement provides 290 kcal and 9 grams of protein (chocolate)  -MVI daily  NUTRITION DIAGNOSIS:   Moderate Malnutrition related to chronic illness as evidenced by mild fat depletion, moderate fat depletion, mild muscle depletion, moderate muscle depletion, energy intake < 75% for > or equal to 1 month, per patient/family report.  GOAL:   Patient will meet greater than or equal to 90% of their needs  MONITOR:   I & O's, Labs, Weight trends, Supplement acceptance, PO intake  REASON FOR ASSESSMENT:   Malnutrition Screening Tool    ASSESSMENT:  73 year old male with medical history significant of HTN, HLD, T2DM presented to ED with complaints of weakness, abdominal pain, and recurrent falls  CT of abdomen pelvis showed retroperitoneal and pelvic lymphadenopathy; oncology consulted  Patient reports that he has better days, but feeling ok, and a little anxious d/t additional pending CT scan of chest the doctor had discussed obtaining with him this morning. Untouched lunch tray (sandwich, pineapple, and chips) Patient reports not feeling hungry and stated that he does not like pineapple; RD updated patient preferences in health touch. He endorses ongoing poor po intake over the past month to month and a half. Recalls drinking 1/2 diet Mt. Dew daily. He reports 1/2 of a hamburger a few days ago; and lean cuisine in ED around 11:30 last night.   Patient meets criteria for malnutrition in the context of chronic disease given history of poor po intake and mild/moderate fat and muscle depletion noted on physical exam Patient amenable to receiving Ensure and Magic Cup during admission.   Patient recalls UBW 220 lbs approximately 1  yr ago Current wt 86.8 kg (191 lb) noted 29 lb wt loss over the past year (13.2 %; insignificant for time frame)   Medications reviewed and include: calcitriol, gabapentin, SS novolog  Labs: CBGS 119-149 Cr 1.52 - trending down B12 - 125 (L) Fe 38 (L) WBC 3.8 (L) Hgb 9.3 - trending down Hypercalcemia - resolved   NUTRITION - FOCUSED PHYSICAL EXAM:    Most Recent Value  Orbital Region  Moderate depletion  Upper Arm Region  Mild depletion  Thoracic and Lumbar Region  No depletion  Buccal Region  Unable to assess [full beard]  Temple Region  Mild depletion  Clavicle Bone Region  No depletion  Clavicle and Acromion Bone Region  No depletion  Scapular Bone Region  No depletion  Dorsal Hand  Moderate depletion  Patellar Region  Moderate depletion  Anterior Thigh Region  Unable to assess  Posterior Calf Region  Moderate depletion  Edema (RD Assessment)  None  Hair  Reviewed  Eyes  Reviewed  Mouth  Reviewed  Skin  Reviewed  Nails  Reviewed      Diet Order:   Diet Order            Diet regular Room service appropriate? Yes; Fluid consistency: Thin  Diet effective now              EDUCATION NEEDS:   No education needs have been identified at this time  Skin:  Skin Assessment: Reviewed RN Assessment  Last BM:  9/17  Height:   Ht Readings from Last 1 Encounters:  07/25/19 6' (1.829 m)    Weight:  Wt Readings from Last 1 Encounters:  07/25/19 86.8 kg    Ideal Body Weight:  80.9 kg  BMI:  Body mass index is 25.95 kg/m.  Estimated Nutritional Needs:   Kcal:  2050-2220 (MSJ 1.2-1.3)  Protein:  103-111 grams  Fluid:  >2L  Lajuan Lines, RD, LDN Office [phone] After Hours/Weekend Pager: [phone]

## 2019-07-25 NOTE — Evaluation (Signed)
Physical Therapy Evaluation Patient Details Name: Daniel Richards MRN: [id number] DOB: Jan 09, 1946 Today's Date: 07/25/2019   History of Present Illness  Efton Winchell  is a 73 y.o. male, with history of hypertension, hyperlipidemia, diabetes mellitus type 2, tremor came to ED with chief complaints of falls at home.  Patient says that he has been feeling more weak and is having recurrent falls at home.  Also complains of abdominal pain.He denies passing out today.  But says that he has passed out in the past.    Clinical Impression  Patient presents with severe tremors in face, upper torso and arms which is baseline for years per patient.  Patient demonstrates slow labored movement for sitting up at bedside, transfers and limited to ambulation in room due to c/o fatigue and increasing left knee pain.  Patient required use of RW due to fall risk and tolerated sitting up in chair after therapy.  Patient will benefit from continued physical therapy in hospital and recommended venue below to increase strength, balance, endurance for safe ADLs and gait.    Follow Up Recommendations SNF    Equipment Recommendations  None recommended by PT    Recommendations for Other Services       Precautions / Restrictions Precautions Precautions: Fall Restrictions Weight Bearing Restrictions: No      Mobility  Bed Mobility Overal bed mobility: Needs Assistance Bed Mobility: Supine to Sit     Supine to sit: Min assist     General bed mobility comments: slow labored movement  Transfers Overall transfer level: Needs assistance Equipment used: Rolling walker (2 wheeled) Transfers: Sit to/from [facility] Sit to Stand: Min assist Stand pivot transfers: Min assist       General transfer comment: slow labored movement  Ambulation/Gait Ambulation/Gait assistance: Min assist Gait Distance (Feet): 20 Feet Assistive device: Rolling walker (2 wheeled) Gait Pattern/deviations:  Decreased step length - right;Decreased step length - left;Decreased stride length Gait velocity: slow   General Gait Details: slow unsteady cadence mostly limited due to increasing right knee pain and c/o fatigue  Stairs            Wheelchair Mobility    Modified Rankin (Stroke Patients Only)       Balance                                             Pertinent Vitals/Pain Pain Assessment: 0-10 Pain Score: 5  Pain Location: low back and left knee pain Pain Descriptors / Indicators: Aching;Sore Pain Intervention(s): Limited activity within patient's tolerance;Monitored during session    [place] expects to be discharged to:: Private residence Living Arrangements: Children Available Help at Discharge: Family;Available PRN/intermittently Type of Home: Mobile home Home Access: Stairs to enter Entrance Stairs-Rails: Right;Left;Can reach both Entrance Stairs-Number of Steps: 5-6   Home Equipment: Cane - single point;Walker - 2 wheels      Prior Function Level of Independence: Independent with assistive device(s)         Comments: was community ambulator and drove months ago, more recently holding on to walls and furniture and limited to household ambulation     Hand Dominance   Dominant Hand: Right    Extremity/Trunk Assessment   Upper Extremity Assessment Upper Extremity Assessment: Generalized weakness    Lower Extremity Assessment Lower Extremity Assessment: Generalized weakness;LLE deficits/detail LLE Deficits / Details: grossly  3+/5 LLE: Unable to fully assess due to pain LLE Sensation: WNL LLE Coordination: WNL    Cervical / Trunk Assessment Cervical / Trunk Assessment: Kyphotic  Communication   Communication: No difficulties  Cognition Arousal/Alertness: Awake/alert Behavior During Therapy: WFL for tasks assessed/performed Overall Cognitive Status: Within Functional Limits for tasks assessed                                         General Comments      Exercises     Assessment/Plan    PT Assessment Patient needs continued PT services  PT Problem List Decreased strength;Decreased activity tolerance;Decreased balance;Decreased mobility       PT Treatment Interventions Gait training;Balance training;Stair training;Functional mobility training;Therapeutic activities;Therapeutic exercise;Patient/family education    PT Goals (Current goals can be found in the Care Plan section)  Acute Rehab PT Goals Patient Stated Goal: return home after rehab PT Goal Formulation: With patient Time For Goal Achievement: 08/08/19 Potential to Achieve Goals: Good    Frequency Min 3X/week   Barriers to discharge        Co-evaluation PT/OT/SLP Co-Evaluation/Treatment: Yes             AM-PAC PT "6 Clicks" Mobility  Outcome Measure Help needed turning from your back to your side while in a flat bed without using bedrails?: A Little Help needed moving from lying on your back to sitting on the side of a flat bed without using bedrails?: A Little Help needed moving to and from a bed to a chair (including a wheelchair)?: A Lot Help needed standing up from a chair using your arms (e.g., wheelchair or bedside chair)?: A Little Help needed to walk in hospital room?: A Lot Help needed climbing 3-5 steps with a railing? : A Lot 6 Click Score: 15    End of Session   Activity Tolerance: Patient tolerated treatment well;Patient limited by fatigue Patient left: in chair;with call bell/phone within reach;with chair alarm set Nurse Communication: Mobility status PT Visit Diagnosis: Other abnormalities of gait and mobility (R26.89);Unsteadiness on feet (R26.81);Muscle weakness (generalized) (M62.81)    Time: [id number] PT Time Calculation (min) (ACUTE ONLY): 32 min   Charges:   PT Evaluation $PT Eval Moderate Complexity: 1 Mod PT Treatments $Therapeutic Activity: 23-37 mins         12:35 PM, 07/25/19 Lonell Grandchild, MPT Physical Therapist with [facility] 336 [phone] office [phone] mobile phone

## 2019-07-25 NOTE — Progress Notes (Signed)
Bladder scan revealed 845mL.  Patient voice he felt the urge to urinate, so  encouraged to do so.    He was able to void 250mL, although nurse tech communicated condom cath dislodged, resulting in urine saturating bed.  Follow up post-void residual showed 766mL.   Straight cath preformed without issue, however only 272mL clear, concentrated yellow, urine drained.  Patient remains with suprapubic region and LLQ abdominal tenderness with palpation, mild ascites and distension. Unsure if post void residual fluid volume measuring this?  Will continue to monitor urine output closely.

## 2019-07-25 NOTE — Plan of Care (Signed)
  Problem: Acute Rehab PT Goals(only PT should resolve) Goal: Pt Will Go Supine/Side To Sit Outcome: Progressing Flowsheets (Taken 07/25/2019 1236) Pt will go Supine/Side to Sit: with supervision Goal: Patient Will Transfer Sit To/From Stand Outcome: Progressing Flowsheets (Taken 07/25/2019 1236) Patient will transfer sit to/from stand:  with supervision  with min guard assist Goal: Pt Will Transfer Bed To Chair/Chair To Bed Outcome: Progressing Flowsheets (Taken 07/25/2019 1236) Pt will Transfer Bed to Chair/Chair to Bed:  with supervision  min guard assist Goal: Pt Will Ambulate Outcome: Progressing Flowsheets (Taken 07/25/2019 1236) Pt will Ambulate:  50 feet  with supervision  with min guard assist  with rolling walker   12:36 PM, 07/25/19 Lonell Grandchild, MPT Physical Therapist with [facility] 336 [phone] office [phone] mobile phone

## 2019-07-26 ENCOUNTER — Encounter (HOSPITAL_COMMUNITY): Payer: Self-pay

## 2019-07-26 DIAGNOSIS — R251 Tremor, unspecified: Secondary | ICD-10-CM

## 2019-07-26 LAB — GLUCOSE, CAPILLARY
Glucose-Capillary: 114 mg/dL — ABNORMAL HIGH (ref 70–99)
Glucose-Capillary: 101 mg/dL — ABNORMAL HIGH (ref 70–99)
Glucose-Capillary: 108 mg/dL — ABNORMAL HIGH (ref 70–99)

## 2019-07-26 LAB — CBC
HCT: 29.9 % — ABNORMAL LOW (ref 39.0–52.0)
Hemoglobin: 9.2 g/dL — ABNORMAL LOW (ref 13.0–17.0)
MCH: 32.5 pg (ref 26.0–34.0)
MCHC: 30.8 g/dL (ref 30.0–36.0)
MCV: 105.7 fL — ABNORMAL HIGH (ref 80.0–100.0)
Platelets: 193 10*3/uL (ref 150–400)
RBC: 2.83 MIL/uL — ABNORMAL LOW (ref 4.22–5.81)
RDW: 15.9 % — ABNORMAL HIGH (ref 11.5–15.5)
WBC: 3.4 10*3/uL — ABNORMAL LOW (ref 4.0–10.5)
nRBC: 0 % (ref 0.0–0.2)

## 2019-07-26 LAB — HEMOGLOBIN A1C
Hgb A1c MFr Bld: 5.6 % (ref 4.8–5.6)
Mean Plasma Glucose: 114 mg/dL

## 2019-07-26 LAB — COMPREHENSIVE METABOLIC PANEL
ALT: 14 U/L (ref 0–44)
AST: 14 U/L — ABNORMAL LOW (ref 15–41)
Albumin: 2.9 g/dL — ABNORMAL LOW (ref 3.5–5.0)
Alkaline Phosphatase: 64 U/L (ref 38–126)
Anion gap: 7 (ref 5–15)
BUN: 20 mg/dL (ref 8–23)
CO2: 26 mmol/L (ref 22–32)
Calcium: 10.6 mg/dL — ABNORMAL HIGH (ref 8.9–10.3)
Chloride: 106 mmol/L (ref 98–111)
Creatinine, Ser: 1.45 mg/dL — ABNORMAL HIGH (ref 0.61–1.24)
GFR calc Af Amer: 54 mL/min — ABNORMAL LOW (ref >60)
GFR calc non Af Amer: 47 mL/min — ABNORMAL LOW (ref >60)
Glucose, Bld: 106 mg/dL — ABNORMAL HIGH (ref 70–99)
Potassium: 4.5 mmol/L (ref 3.5–5.1)
Sodium: 139 mmol/L (ref 135–145)
Total Bilirubin: 0.5 mg/dL (ref 0.3–1.2)
Total Protein: 6.2 g/dL — ABNORMAL LOW (ref 6.5–8.1)

## 2019-07-26 LAB — MAGNESIUM: Magnesium: 2 mg/dL (ref 1.7–2.4)

## 2019-07-26 NOTE — Progress Notes (Signed)
PROGRESS NOTE    Daniel Richards  [id number]  DOB: May 22, 1946  DOA: 07/24/2019 PCP: Scotty Court, DO   Brief Admission Hx: 73 year old gentleman with hypertension hyperlipidemia diabetes chronic resting tremor presented with frequent falls at home.  He also had complained of abdominal pain and CT shows some worrisome abdominal lymph nodes suggesting possible malignancy.  MDM/Assessment & Plan:   1. Generalized weakness with recurrent falling at home-patient has been seen by physical therapy and they are recommending SNF placement.  I have spoken with the transitions of care team and they are working on finding placement. 2. Retroperitoneal/pelvic lymphadenopathy- CT abdomen findings are worrisome for malignancy.  I spoke with oncology and they have ordered flow cytometry, PSA not elevated, unfortunately the location of the nodes make biopsy fairly dangerous and may not be able to be done.  They are going to ask IR to take a look.  Follow-up on outstanding tests.  Oncology will see him on Monday. 3. Chronic urinary retention - he had I/O cath and bladder scan done 9/18, monitor urine output closely.  4. Hypercalcemia- his calcium levels are correcting with IV fluid hydration.  Continue to monitor closely. 5. AKI-continue gentle hydration there has been some improvement in creatinine with fluids. 6. Type 2 diabetes mellitus-continue sliding scale coverage. 7. Chronic resting tremor-continue Mysoline and Topamax and propanolol. 8. Essential hypertension- continue propranolol for blood pressure control.   DVT prophylaxis: Lovenox Code Status: Full Family Communication: family at bedside Disposition Plan: Inpatient   Consultants:  Oncology  PT/OT  Procedures:    Antimicrobials:     Subjective: Patient without complaints this morning.  He says that he is hungry and wants to eat.  He is urinating but still having pressure in bladder.   Objective: Vitals:   07/25/19  1924 07/25/19 2137 07/26/19 0623 07/26/19 0745  BP:  132/67 (!) 156/69   Pulse:  81 75   Resp:  20 20   Temp:  100.2 F (37.9 C) 98.1 F (36.7 C)   TempSrc:  Oral Oral   SpO2: 93% 99% 96% 95%  Weight:      Height:        Intake/Output Summary (Last 24 hours) at 07/26/2019 1014 Last data filed at 07/26/2019 0600 Gross per 24 hour  Intake 1633.32 ml  Output 2500 ml  Net -866.68 ml   Filed Weights   07/24/19 1329 07/25/19 0210  Weight: 90.7 kg 86.8 kg   REVIEW OF SYSTEMS  As per history otherwise all reviewed and reported negative  Exam:  General exam: Chronically ill-appearing male sitting up in the bed he is in no apparent distress he is cooperative.  He has a chronic resting tremor Respiratory system: Clear. No increased work of breathing. Cardiovascular system: S1 & S2 heard. No JVD, murmurs, gallops, clicks or pedal edema. Gastrointestinal system: Abdomen is nondistended, soft and generalized tenderness. Mild suprapubic tenderness.  Normal bowel sounds heard. Central nervous system: Alert and oriented. No focal neurological deficits. Extremities: no CCE.  Data Reviewed: Basic Metabolic Panel: Recent Labs  Lab 07/24/19 1551 07/25/19 0608 07/26/19 0558  NA 138 135 139  K 3.9 3.8 4.5  CL 102 102 106  CO2 27 25 26   GLUCOSE 113* 106* 106*  BUN 24* 22 20  CREATININE 1.64* 1.52* 1.45*  CALCIUM 10.8* 9.9 10.6*  MG  --   --  2.0   Liver Function Tests: Recent Labs  Lab 07/24/19 1551 07/25/19 0608 07/26/19 0558  AST 18  16 14*  ALT 16 15 14   ALKPHOS 78 72 64  BILITOT 0.6 0.5 0.5  PROT 7.1 6.6 6.2*  ALBUMIN 3.3* 3.0* 2.9*   Recent Labs  Lab 07/24/19 1551  LIPASE 21   No results for input(s): AMMONIA in the last 168 hours. CBC: Recent Labs  Lab 07/24/19 1551 07/25/19 0608 07/26/19 0558  WBC 4.0 3.8* 3.4*  NEUTROABS 1.7  --   --   HGB 10.1* 9.3* 9.2*  HCT 32.2* 29.8* 29.9*  MCV 104.9* 103.8* 105.7*  PLT 201 179 193   Cardiac Enzymes: Recent  Labs  Lab 07/24/19 1551  CKTOTAL 34*   CBG (last 3)  Recent Labs    07/25/19 1110 07/25/19 1557 07/26/19 0720  GLUCAP 149* 216* 114*   Recent Results (from the past 240 hour(s))  SARS CORONAVIRUS 2 (TAT 6-24 HRS) Nasopharyngeal Nasopharyngeal Swab     Status: None   Collection Time: 07/24/19  9:53 PM   Specimen: Nasopharyngeal Swab  Result Value Ref Range Status   SARS Coronavirus 2 NEGATIVE NEGATIVE Final    Comment: (NOTE) SARS-CoV-2 target nucleic acids are NOT DETECTED. The SARS-CoV-2 RNA is generally detectable in upper and lower respiratory specimens during the acute phase of infection. Negative results do not preclude SARS-CoV-2 infection, do not rule out co-infections with other pathogens, and should not be used as the sole basis for treatment or other patient management decisions. Negative results must be combined with clinical observations, patient history, and epidemiological information. The expected result is Negative. Fact Sheet for Patients: SugarRoll.be Fact Sheet for Healthcare Providers: https://www.woods-mathews.com/ This test is not yet approved or cleared by the [place] FDA and  has been authorized for detection and/or diagnosis of SARS-CoV-2 by FDA under an Emergency Use Authorization (EUA). This EUA will remain  in effect (meaning this test can be used) for the duration of the COVID-19 declaration under Section 56 4(b)(1) of the Act, 21 U.S.C. section 360bbb-3(b)(1), unless the authorization is terminated or revoked sooner. Performed at [facility], [place] [address], [place], [place] [address]      Studies: Ct Head Wo Contrast  Result Date: 07/24/2019 CLINICAL DATA:  Trauma. EXAM: CT HEAD WITHOUT CONTRAST CT CERVICAL SPINE WITHOUT CONTRAST TECHNIQUE: Multidetector CT imaging of the head and cervical spine was performed following the standard protocol without intravenous contrast. Multiplanar CT  image reconstructions of the cervical spine were also generated. COMPARISON:  CT scan of November 16, 2016. FINDINGS: CT HEAD FINDINGS Brain: Mild chronic ischemic white matter disease is noted. No mass effect or midline shift is noted. Ventricular size is within normal limits. There is no evidence of mass lesion, hemorrhage or acute infarction. Vascular: No hyperdense vessel or unexpected calcification. Skull: Normal. Negative for fracture or focal lesion. Sinuses/Orbits: Right frontal and ethmoid mucosal thickening is noted. Other: None. CT CERVICAL SPINE FINDINGS Alignment: Normal. Skull base and vertebrae: No acute fracture. No primary bone lesion or focal pathologic process. Soft tissues and spinal canal: No prevertebral fluid or swelling. No visible canal hematoma. Disc levels: Status post surgical anterior fusion of C5-6. Mild anterior osteophyte formation is also noted at C4-5 and C6-7. Upper chest: Negative. Other: None. IMPRESSION: Mild chronic ischemic white matter disease. No acute intracranial abnormality seen. Postsurgical and degenerative changes are noted in the cervical spine. No fracture or significant spondylolisthesis is noted. Electronically Signed   By: Marijo Conception M.D.   On: 07/24/2019 16:20   Ct Chest W Contrast  Result  Date: 07/25/2019 CLINICAL DATA:  Abdominal lymphadenopathy, evaluate chest, history of recent falls and weakness EXAM: CT CHEST WITH CONTRAST TECHNIQUE: Multidetector CT imaging of the chest was performed during intravenous contrast administration. CONTRAST:  7mL OMNIPAQUE IOHEXOL 300 MG/ML  SOLN COMPARISON:  CT abdomen pelvis, 07/24/2019, CT chest, 09/20/2009 FINDINGS: Cardiovascular: Aortic atherosclerosis. Normal heart size. Extensive 3 vessel coronary artery calcifications. No pericardial effusion. Mediastinum/Nodes: There is an enlarged paraesophageal lymph node measuring 2.0 x 1.4 cm at the level of T6-T7 (series 2, image 71). There are prominent periaortic and  retrocrural nodes lower in the thorax (series 2, image 135, 153). There are enlarged left lower cervical/superior mediastinal lymph nodes measuring up to 1.2 x 1.0 cm (series 2, image 7). No other enlarged mediastinal, hilar, or axillary lymph nodes. Thyroid gland, trachea, and esophagus demonstrate no significant findings. Lungs/Pleura: Lungs are clear. No pleural effusion or pneumothorax. Upper Abdomen: No acute abnormality. Bulky retroperitoneal lymphadenopathy, better assessed on prior CT of the abdomen pelvis. Musculoskeletal: No chest wall mass or suspicious bone lesions identified. IMPRESSION: 1. There is an enlarged paraesophageal lymph node measuring 2.0 x 1.4 cm at the level of T6-T7 (series 2, image 71). There are prominent periaortic and retrocrural nodes lower in the thorax (series 2, image 135, 153). 2. There are enlarged left lower cervical/superior mediastinal lymph nodes measuring up to 1.2 x 1.0 cm (series 2, image 7). 3.  Coronary artery disease.  Aortic Atherosclerosis (ICD10-I70.0). Electronically Signed   By: Eddie Candle M.D.   On: 07/25/2019 14:51   Ct Cervical Spine Wo Contrast  Result Date: 07/24/2019 CLINICAL DATA:  Trauma. EXAM: CT HEAD WITHOUT CONTRAST CT CERVICAL SPINE WITHOUT CONTRAST TECHNIQUE: Multidetector CT imaging of the head and cervical spine was performed following the standard protocol without intravenous contrast. Multiplanar CT image reconstructions of the cervical spine were also generated. COMPARISON:  CT scan of November 16, 2016. FINDINGS: CT HEAD FINDINGS Brain: Mild chronic ischemic white matter disease is noted. No mass effect or midline shift is noted. Ventricular size is within normal limits. There is no evidence of mass lesion, hemorrhage or acute infarction. Vascular: No hyperdense vessel or unexpected calcification. Skull: Normal. Negative for fracture or focal lesion. Sinuses/Orbits: Right frontal and ethmoid mucosal thickening is noted. Other: None. CT  CERVICAL SPINE FINDINGS Alignment: Normal. Skull base and vertebrae: No acute fracture. No primary bone lesion or focal pathologic process. Soft tissues and spinal canal: No prevertebral fluid or swelling. No visible canal hematoma. Disc levels: Status post surgical anterior fusion of C5-6. Mild anterior osteophyte formation is also noted at C4-5 and C6-7. Upper chest: Negative. Other: None. IMPRESSION: Mild chronic ischemic white matter disease. No acute intracranial abnormality seen. Postsurgical and degenerative changes are noted in the cervical spine. No fracture or significant spondylolisthesis is noted. Electronically Signed   By: Marijo Conception M.D.   On: 07/24/2019 16:20   Ct Abdomen Pelvis W Contrast  Result Date: 07/24/2019 CLINICAL DATA:  Bilateral leg weakness. Patient fell in the bathtub today. EXAM: CT ABDOMEN AND PELVIS WITH CONTRAST TECHNIQUE: Multidetector CT imaging of the abdomen and pelvis was performed using the standard protocol following bolus administration of intravenous contrast. CONTRAST:  32mL OMNIPAQUE IOHEXOL 300 MG/ML SOLN, 30mL OMNIPAQUE IOHEXOL 300 MG/ML SOLN COMPARISON:  06/09/2011 FINDINGS: Lower chest: Minor linear atelectasis or scarring in the anterior lung bases. Lung bases otherwise clear. Heart normal in size. Hepatobiliary: No focal liver abnormality is seen. No gallstones, gallbladder wall thickening, or biliary dilatation. Pancreas:  Unremarkable. No pancreatic ductal dilatation or surrounding inflammatory changes. Spleen: Normal in size without focal abnormality. Adrenals/Urinary Tract: No adrenal masses. Kidneys are normal in position. There is bilateral renal cortical thinning. 18 mm low-attenuation mass, posterior midpole the left kidney, consistent with a cyst. No other renal masses. Small nonobstructing stones in both kidneys. No hydronephrosis. Ureters normal in course and in caliber. No ureteral stones. Bladder moderately distended. There are dependent stones  with some mild increased attenuation in the dependent bladder is well, likely debris. No wall thickening or convincing mass. Stomach/Bowel: Stomach is unremarkable. Small bowel and colon are normal in caliber. No wall thickening. No inflammation. Mild increased colonic stool burden. No evidence of appendicitis. Appendix not visualized. Vascular/Lymphatic: There is extensive adenopathy. Bulky retroperitoneal adenopathy is noted extending from level of the adrenal glands into the pelvis. Node at the level of the midpole the left kidney, left periaortic, measures 4.3 cm in short axis. Left periaortic node at the level of the bifurcation measures 4.3 cm short axis. There are bulky left pelvic lymph nodes. Posteroinferior pelvic node, to the left of the lower sigmoid colon, measures 7.4 x 4.6 cm transversely. Adjacent left external iliac chain node measures 7.2 x 3.5 cm adenopathy surrounds the common iliac veins, greater on the left, as well as the left external iliac vein. It is not defined and may be occluded. No mesenteric adenopathy. No enlarged inguinal lymph nodes. Dense aortic atherosclerosis.  No aneurysm. Reproductive: Prostate is enlarged measuring 5.9 x 5.5 x 5.6 cm. Other: No abdominal wall hernia or abnormality. No abdominopelvic ascites. Musculoskeletal: No fracture or acute finding. No osteoblastic or osteolytic lesions. IMPRESSION: 1. There is bulky retroperitoneal and left pelvic lymphadenopathy that has developed since the prior CT. Adenopathy may occlude the left common and/or external iliac vein. Differential diagnosis includes lymphoma and other lymphoproliferative disorders. Consider the possibility of prostate carcinoma metastatic to lymph nodes. 2. No acute findings within the abdomen or pelvis. 3. Bilateral intrarenal stones. Dependent bladder stones as well as a small amount of dependent bladder debris. 4. Bilateral renal cortical thinning. 5. Aortic atherosclerosis. Electronically Signed   By:  Lajean Manes M.D.   On: 07/24/2019 20:21   US Renal  Result Date: 07/25/2019 CLINICAL DATA:  Acute kidney injury. EXAM: RENAL / URINARY TRACT ULTRASOUND COMPLETE COMPARISON:  CT abdomen and pelvis 07/24/2019. FINDINGS: Right Kidney: Renal measurements: 10.4 x 5.1 x 4.9 cm = volume: 138 mL . Echogenicity within normal limits. No mass or hydronephrosis visualized. Cortex is thinned. Punctate nonobstructing stone in the lower pole seen on prior CT noted. Left Kidney: Renal measurements: 11.2 x 5.9 x 5.1 cm = volume: 180 mL. Echogenicity within normal limits. No solid mass or hydronephrosis visualized. Cortex is mildly thinned. Simple 2.1 cm cyst noted. Small nonobstructing stones are seen as on prior CT. Bladder: Bladder calculi are identified as seen on prior CT. The bladder is distended. IMPRESSION: No acute abnormality.  Negative for hydronephrosis. Bilateral nonobstructing renal stones, more numerous on the left. Distended urinary bladder. Electronically Signed   By: Inge Rise M.D.   On: 07/25/2019 14:48   Dg Abd Acute 2+v W 1v Chest  Result Date: 07/24/2019 CLINICAL DATA:  Fall with hip pain EXAM: DG ABDOMEN ACUTE W/ 1V CHEST COMPARISON:  11/16/2016 chest x-ray FINDINGS: Single-view chest demonstrate surgical hardware in the lower cervical spine. No focal opacity or pleural effusion. Normal heart size. No pneumothorax. Supine and upright views of the abdomen demonstrate no free air beneath  the diaphragm. Nonobstructed gas pattern with moderate stool. No radiopaque calculi. Phleboliths in the pelvis IMPRESSION: Negative abdominal radiographs.  No acute cardiopulmonary disease. Electronically Signed   By: Donavan Foil M.D.   On: 07/24/2019 15:37   Dg Hip Unilat W Or Wo Pelvis 2-3 Views Left  Result Date: 07/24/2019 CLINICAL DATA:  Fall with left hip pain EXAM: DG HIP (WITH OR WITHOUT PELVIS) 2-3V LEFT COMPARISON:  11/16/2016, CT 06/09/2011 FINDINGS: SI joints are non widened. Pubic symphysis and  rami appear intact. No fracture or malalignment. Mild degenerative changes of the left hip with mild joint space narrowing and osteophytosis at the left femoral head neck junction. IMPRESSION: No acute osseous abnormality Electronically Signed   By: Donavan Foil M.D.   On: 07/24/2019 15:36   Scheduled Meds:  citalopram  20 mg Oral QHS   enoxaparin (LOVENOX) injection  40 mg Subcutaneous Q24H   feeding supplement (ENSURE ENLIVE)  237 mL Oral BID BM   gabapentin  600 mg Oral TID   insulin aspart  0-9 Units Subcutaneous TID WC   multivitamin with minerals  1 tablet Oral Daily   primidone  75 mg Oral Q8H   propranolol  40 mg Oral BID   tamsulosin  0.4 mg Oral Daily   topiramate  200 mg Oral QHS   Continuous Infusions:  sodium chloride 65 mL/hr at 07/25/19 1858    Active Problems:   Hypercalcemia   Generalized weakness   Abdominal lymphadenopathy   Retroperitoneal lymphadenopathy   Tremor   Diabetes mellitus   Malnutrition of moderate degree  Time spent:   Irwin Brakeman, MD [facility] 07/26/2019, 10:14 AM    LOS: 1 day  How to contact the [facility] Attending or Consulting provider [place] or covering provider during after hours [place], for this patient?  1. Check the care team in [facility] and look for a) attending/consulting [facility] provider listed and b) the [facility] team listed 2. Log into [email or web address] and use 's universal password to access. If you do not have the password, please contact the hospital operator. 3. Locate the [facility] provider you are looking for under [facility] and page to a number that you can be directly reached. 4. If you still have difficulty reaching the provider, please page the [facility] (Director on Call) for the Hospitalists listed on amion for assistance.

## 2019-07-26 NOTE — Progress Notes (Signed)
Patient had urinary retention during the night and this am per night shift RN report. Pt voiding small amounts today with use of urinal. He reports pressure and "feeling like I"m about to burst" this afternoon. Bladder scan volume showed >200 ml, lower abdomen/pelvic area tight. Reports discomfort and unable to void more than 50-100 ml this afternoon. Required I&O cath yesterday and during the night per report. Notified MD to seek clarification on if he would like another I&O cath or foley catheter placed. Donavan Foil, RN

## 2019-07-26 NOTE — Progress Notes (Signed)
Patient with urinary retention. Placed 16 Fr catheter without difficulty with second assistance of nursing student, Earnie Larsson.  The patient tolerated well. Return of 1050 cc yellow, clear, with minimal sediment present. The patient is now resting, states his abdomen feels better. Will continue to monitor.

## 2019-07-26 NOTE — Progress Notes (Signed)
Received call from patient's daughter [name] this afternoon. She expressed concerns after speaking with MD. Stated "I spoke with the doctor and he mentioned some possible tests. What do I need to do to have him transferred to [facility] today?" Requested to speak with MD regarding this request. Notified Dr. Wynetta Emery of her concerns and request to speak with him. No immediate need for transfer at this time per MD, stated had spoken with her today and will follow-up with her tomorrow. Donavan Foil, RN

## 2019-07-27 LAB — CBC
HCT: 29.9 % — ABNORMAL LOW (ref 39.0–52.0)
Hemoglobin: 9.1 g/dL — ABNORMAL LOW (ref 13.0–17.0)
MCH: 32.3 pg (ref 26.0–34.0)
MCHC: 30.4 g/dL (ref 30.0–36.0)
MCV: 106 fL — ABNORMAL HIGH (ref 80.0–100.0)
Platelets: 203 10*3/uL (ref 150–400)
RBC: 2.82 MIL/uL — ABNORMAL LOW (ref 4.22–5.81)
RDW: 15.8 % — ABNORMAL HIGH (ref 11.5–15.5)
WBC: 3.1 10*3/uL — ABNORMAL LOW (ref 4.0–10.5)
nRBC: 0 % (ref 0.0–0.2)

## 2019-07-27 LAB — GLUCOSE, CAPILLARY
Glucose-Capillary: 95 mg/dL (ref 70–99)
Glucose-Capillary: 159 mg/dL — ABNORMAL HIGH (ref 70–99)
Glucose-Capillary: 125 mg/dL — ABNORMAL HIGH (ref 70–99)
Glucose-Capillary: 152 mg/dL — ABNORMAL HIGH (ref 70–99)

## 2019-07-27 LAB — BASIC METABOLIC PANEL
Anion gap: 7 (ref 5–15)
BUN: 16 mg/dL (ref 8–23)
CO2: 25 mmol/L (ref 22–32)
Calcium: 10.4 mg/dL — ABNORMAL HIGH (ref 8.9–10.3)
Chloride: 106 mmol/L (ref 98–111)
Creatinine, Ser: 1.39 mg/dL — ABNORMAL HIGH (ref 0.61–1.24)
GFR calc Af Amer: 57 mL/min — ABNORMAL LOW (ref >60)
GFR calc non Af Amer: 49 mL/min — ABNORMAL LOW (ref >60)
Glucose, Bld: 105 mg/dL — ABNORMAL HIGH (ref 70–99)
Potassium: 4.6 mmol/L (ref 3.5–5.1)
Sodium: 138 mmol/L (ref 135–145)

## 2019-07-27 LAB — MAGNESIUM: Magnesium: 2 mg/dL (ref 1.7–2.4)

## 2019-07-27 MED ORDER — HYDROCODONE-ACETAMINOPHEN 10-325 MG PO TABS
1.0000 | ORAL_TABLET | ORAL | Status: DC | PRN
  Administered 2019-07-27 – 2019-08-01 (×10): 1 via ORAL
  Filled 2019-07-27 (×12): qty 1

## 2019-07-27 MED ORDER — AMLODIPINE BESYLATE 5 MG PO TABS
5.0000 mg | ORAL_TABLET | Freq: Every day | ORAL | Status: DC
  Administered 2019-07-27 – 2019-08-01 (×5): 5 mg via ORAL
  Filled 2019-07-27 (×7): qty 1

## 2019-07-27 MED ORDER — TRAZODONE HCL 50 MG PO TABS
50.0000 mg | ORAL_TABLET | Freq: Once | ORAL | Status: AC
  Administered 2019-07-27: 50 mg via ORAL
  Filled 2019-07-27: qty 1

## 2019-07-27 NOTE — Progress Notes (Signed)
Patient c/o hip and leg pain, He states "I have had this before" rates pain 7/10. Gave PRN pain medication.

## 2019-07-27 NOTE — Progress Notes (Signed)
PROGRESS NOTE    Daniel Richards  [id number]  DOB: [birth year]  DOA: 07/24/2019 PCP: Scotty Court, DO  Brief Admission Hx: 73 year old gentleman with hypertension hyperlipidemia diabetes chronic resting tremor presented with frequent falls at home.  He also had complained of abdominal pain and CT shows some worrisome abdominal lymph nodes suggesting possible malignancy.  MDM/Assessment & Plan:   1. Generalized weakness with recurrent falling at home-patient has been seen by physical therapy and they are recommending SNF placement.  I have spoken with the transitions of care team and they are working on finding placement. 2. Retroperitoneal/pelvic lymphadenopathy- CT abdomen findings are worrisome for malignancy.  I spoke with oncology and they have ordered flow cytometry, PSA not elevated, unfortunately the location of the nodes make biopsy fairly dangerous and may not be able to be done.  They are going to ask IR to take a look.  Follow-up on outstanding tests.  Oncology will see him on Monday. 3. Chronic urinary retention - he had I/O cath and bladder scan done 9/18, Foley was placed 9/19 with >1000cc output, continue foley for now, started flomax 0.4 mg daily.  4. Hypercalcemia- his calcium levels are improving with IV fluid hydration.  Continue to monitor closely. 5. AKI-continue gentle hydration there has been some improvement in creatinine with fluids. 6. Type 2 diabetes mellitus-continue sliding scale coverage. 7. Chronic resting tremor-continue Mysoline and Topamax and propanolol. 8. Essential hypertension- continue propranolol for blood pressure control.   DVT prophylaxis: Lovenox Code Status: Full Family Communication: family telephone update daughter Disposition Plan: Inpatient   Consultants:  Oncology  PT/OT  Procedures:    Antimicrobials:     Subjective: Patient without complaints this morning.   Objective: Vitals:   07/26/19 1300 07/26/19 2048  07/27/19 0458 07/27/19 0828  BP: 136/68 132/60 (!) 142/59 (!) 152/67  Pulse:  80 72 79  Resp: 20 18 18    Temp: 98.3 F (36.8 C) 98 F (36.7 C) 99.2 F (37.3 C)   TempSrc: Oral Oral Oral   SpO2: 93% 99% 94%   Weight:      Height:        Intake/Output Summary (Last 24 hours) at 07/27/2019 1121 Last data filed at 07/27/2019 0500 Gross per 24 hour  Intake 833.71 ml  Output 2400 ml  Net -1566.29 ml   Filed Weights   07/24/19 1329 07/25/19 0210  Weight: 90.7 kg 86.8 kg   REVIEW OF SYSTEMS  As per history otherwise all reviewed and reported negative  Exam:  General exam: Chronically ill-appearing male sitting up in the bed he is in no apparent distress he is cooperative.  He has a chronic resting tremor Respiratory system: Clear. No increased work of breathing. Cardiovascular system: S1 & S2 heard. No JVD, murmurs, gallops, clicks or pedal edema. Gastrointestinal system: Abdomen is nondistended, soft and generalized tenderness. Mild suprapubic tenderness.  Normal bowel sounds heard. Central nervous system: Alert and oriented. No focal neurological deficits. Extremities: no CCE.  Data Reviewed: Basic Metabolic Panel: Recent Labs  Lab 07/24/19 1551 07/25/19 0608 07/26/19 0558 07/27/19 0552  NA 138 135 139 138  K 3.9 3.8 4.5 4.6  CL 102 102 106 106  CO2 27 25 26 25   GLUCOSE 113* 106* 106* 105*  BUN 24* 22 20 16   CREATININE 1.64* 1.52* 1.45* 1.39*  CALCIUM 10.8* 9.9 10.6* 10.4*  MG  --   --  2.0 2.0   Liver Function Tests: Recent Labs  Lab 07/24/19 1551 07/25/19  [id number] 07/26/19 0558  AST 18 16 14*  ALT 16 15 14   ALKPHOS 78 72 64  BILITOT 0.6 0.5 0.5  PROT 7.1 6.6 6.2*  ALBUMIN 3.3* 3.0* 2.9*   Recent Labs  Lab 07/24/19 1551  LIPASE 21   No results for input(s): AMMONIA in the last 168 hours. CBC: Recent Labs  Lab 07/24/19 1551 07/25/19 0608 07/26/19 0558 07/27/19 0552  WBC 4.0 3.8* 3.4* 3.1*  NEUTROABS 1.7  --   --   --   HGB 10.1* 9.3* 9.2* 9.1*   HCT 32.2* 29.8* 29.9* 29.9*  MCV 104.9* 103.8* 105.7* 106.0*  PLT 201 179 193 203   Cardiac Enzymes: Recent Labs  Lab 07/24/19 1551  CKTOTAL 34*   CBG (last 3)  Recent Labs    07/26/19 1103 07/26/19 1634 07/27/19 0723  GLUCAP 101* 108* 95   Recent Results (from the past 240 hour(s))  SARS CORONAVIRUS 2 (TAT 6-24 HRS) Nasopharyngeal Nasopharyngeal Swab     Status: None   Collection Time: 07/24/19  9:53 PM   Specimen: Nasopharyngeal Swab  Result Value Ref Range Status   SARS Coronavirus 2 NEGATIVE NEGATIVE Final    Comment: (NOTE) SARS-CoV-2 target nucleic acids are NOT DETECTED. The SARS-CoV-2 RNA is generally detectable in upper and lower respiratory specimens during the acute phase of infection. Negative results do not preclude SARS-CoV-2 infection, do not rule out co-infections with other pathogens, and should not be used as the sole basis for treatment or other patient management decisions. Negative results must be combined with clinical observations, patient history, and epidemiological information. The expected result is Negative. Fact Sheet for Patients: SugarRoll.be Fact Sheet for Healthcare Providers: https://www.woods-mathews.com/ This test is not yet approved or cleared by the [place] FDA and  has been authorized for detection and/or diagnosis of SARS-CoV-2 by FDA under an Emergency Use Authorization (EUA). This EUA will remain  in effect (meaning this test can be used) for the duration of the COVID-19 declaration under Section 56 4(b)(1) of the Act, 21 U.S.C. section 360bbb-3(b)(1), unless the authorization is terminated or revoked sooner. Performed at [facility], [place] [address], [place], [place] [address]      Studies: Ct Chest W Contrast  Result Date: 07/25/2019 CLINICAL DATA:  Abdominal lymphadenopathy, evaluate chest, history of recent falls and weakness EXAM: CT CHEST WITH CONTRAST TECHNIQUE:  Multidetector CT imaging of the chest was performed during intravenous contrast administration. CONTRAST:  47mL OMNIPAQUE IOHEXOL 300 MG/ML  SOLN COMPARISON:  CT abdomen pelvis, 07/24/2019, CT chest, 09/20/2009 FINDINGS: Cardiovascular: Aortic atherosclerosis. Normal heart size. Extensive 3 vessel coronary artery calcifications. No pericardial effusion. Mediastinum/Nodes: There is an enlarged paraesophageal lymph node measuring 2.0 x 1.4 cm at the level of T6-T7 (series 2, image 71). There are prominent periaortic and retrocrural nodes lower in the thorax (series 2, image 135, 153). There are enlarged left lower cervical/superior mediastinal lymph nodes measuring up to 1.2 x 1.0 cm (series 2, image 7). No other enlarged mediastinal, hilar, or axillary lymph nodes. Thyroid gland, trachea, and esophagus demonstrate no significant findings. Lungs/Pleura: Lungs are clear. No pleural effusion or pneumothorax. Upper Abdomen: No acute abnormality. Bulky retroperitoneal lymphadenopathy, better assessed on prior CT of the abdomen pelvis. Musculoskeletal: No chest wall mass or suspicious bone lesions identified. IMPRESSION: 1. There is an enlarged paraesophageal lymph node measuring 2.0 x 1.4 cm at the level of T6-T7 (series 2, image 71). There are prominent periaortic and retrocrural nodes lower in the thorax (series  2, image 135, 153). 2. There are enlarged left lower cervical/superior mediastinal lymph nodes measuring up to 1.2 x 1.0 cm (series 2, image 7). 3.  Coronary artery disease.  Aortic Atherosclerosis (ICD10-I70.0). Electronically Signed   By: Eddie Candle M.D.   On: 07/25/2019 14:51   US Renal  Result Date: 07/25/2019 CLINICAL DATA:  Acute kidney injury. EXAM: RENAL / URINARY TRACT ULTRASOUND COMPLETE COMPARISON:  CT abdomen and pelvis 07/24/2019. FINDINGS: Right Kidney: Renal measurements: 10.4 x 5.1 x 4.9 cm = volume: 138 mL . Echogenicity within normal limits. No mass or hydronephrosis visualized. Cortex is  thinned. Punctate nonobstructing stone in the lower pole seen on prior CT noted. Left Kidney: Renal measurements: 11.2 x 5.9 x 5.1 cm = volume: 180 mL. Echogenicity within normal limits. No solid mass or hydronephrosis visualized. Cortex is mildly thinned. Simple 2.1 cm cyst noted. Small nonobstructing stones are seen as on prior CT. Bladder: Bladder calculi are identified as seen on prior CT. The bladder is distended. IMPRESSION: No acute abnormality.  Negative for hydronephrosis. Bilateral nonobstructing renal stones, more numerous on the left. Distended urinary bladder. Electronically Signed   By: Inge Rise M.D.   On: 07/25/2019 14:48   Scheduled Meds: . citalopram  20 mg Oral QHS  . enoxaparin (LOVENOX) injection  40 mg Subcutaneous Q24H  . feeding supplement (ENSURE ENLIVE)  237 mL Oral BID BM  . gabapentin  600 mg Oral TID  . insulin aspart  0-9 Units Subcutaneous TID WC  . multivitamin with minerals  1 tablet Oral Daily  . primidone  75 mg Oral Q8H  . propranolol  40 mg Oral BID  . tamsulosin  0.4 mg Oral Daily  . topiramate  200 mg Oral QHS   Continuous Infusions: . sodium chloride 50 mL/hr at 07/26/19 1043    Active Problems:   Hypercalcemia   Generalized weakness   Abdominal lymphadenopathy   Retroperitoneal lymphadenopathy   Tremor   Diabetes mellitus   Malnutrition of moderate degree  Time spent:   Irwin Brakeman, MD [facility] 07/27/2019, 11:21 AM    LOS: 2 days  How to contact the [facility] Attending or Consulting provider [place] or covering provider during after hours [place], for this patient?  1. Check the care team in [facility] and look for a) attending/consulting TRH provider listed and b) the [facility] team listed 2. Log into [email or web address] and use [place]'s universal password to access. If you do not have the password, please contact the hospital operator. 3. Locate the [facility] provider you are looking for under [facility] and page to a number that you  can be directly reached. 4. If you still have difficulty reaching the provider, please page the [facility] (Director on Call) for the Hospitalists listed on amion for assistance.

## 2019-07-28 DIAGNOSIS — R590 Localized enlarged lymph nodes: Secondary | ICD-10-CM

## 2019-07-28 DIAGNOSIS — E538 Deficiency of other specified B group vitamins: Secondary | ICD-10-CM

## 2019-07-28 LAB — PROTEIN ELECTROPHORESIS, SERUM
A/G Ratio: 0.9 (ref 0.7–1.7)
Albumin ELP: 2.9 g/dL (ref 2.9–4.4)
Alpha-1-Globulin: 0.3 g/dL (ref 0.0–0.4)
Alpha-2-Globulin: 0.9 g/dL (ref 0.4–1.0)
Beta Globulin: 0.9 g/dL (ref 0.7–1.3)
Gamma Globulin: 1.1 g/dL (ref 0.4–1.8)
Globulin, Total: 3.2 g/dL (ref 2.2–3.9)
Total Protein ELP: 6.1 g/dL (ref 6.0–8.5)

## 2019-07-28 LAB — GLUCOSE, CAPILLARY
Glucose-Capillary: 121 mg/dL — ABNORMAL HIGH (ref 70–99)
Glucose-Capillary: 110 mg/dL — ABNORMAL HIGH (ref 70–99)
Glucose-Capillary: 135 mg/dL — ABNORMAL HIGH (ref 70–99)
Glucose-Capillary: 163 mg/dL — ABNORMAL HIGH (ref 70–99)

## 2019-07-28 MED ORDER — CALCITONIN (SALMON) 200 UNIT/ML IJ SOLN
4.0000 [IU]/kg | Freq: Two times a day (BID) | INTRAMUSCULAR | Status: AC
  Administered 2019-07-28 – 2019-07-29 (×3): 362 [IU] via SUBCUTANEOUS
  Filled 2019-07-28 (×4): qty 1.81

## 2019-07-28 MED ORDER — VITAMIN B-12 1000 MCG PO TABS
1000.0000 ug | ORAL_TABLET | Freq: Every day | ORAL | Status: DC
  Administered 2019-07-29 – 2019-08-01 (×3): 1000 ug via ORAL
  Filled 2019-07-28 (×4): qty 1

## 2019-07-28 MED ORDER — FOLIC ACID 1 MG PO TABS
1.0000 mg | ORAL_TABLET | Freq: Every day | ORAL | Status: DC
  Administered 2019-07-28 – 2019-08-01 (×4): 1 mg via ORAL
  Filled 2019-07-28 (×5): qty 1

## 2019-07-28 MED ORDER — CYANOCOBALAMIN 1000 MCG/ML IJ SOLN
1000.0000 ug | Freq: Once | INTRAMUSCULAR | Status: AC
  Administered 2019-07-28: 1000 ug via INTRAMUSCULAR
  Filled 2019-07-28: qty 1

## 2019-07-28 MED ORDER — ZOLEDRONIC ACID 4 MG/5ML IV CONC
4.0000 mg | Freq: Once | INTRAVENOUS | Status: AC
  Administered 2019-07-28: 4 mg via INTRAVENOUS
  Filled 2019-07-28: qty 5

## 2019-07-28 MED ORDER — CHLORHEXIDINE GLUCONATE CLOTH 2 % EX PADS
6.0000 | MEDICATED_PAD | Freq: Every day | CUTANEOUS | Status: DC
  Administered 2019-07-28 – 2019-07-29 (×2): 6 via TOPICAL

## 2019-07-28 NOTE — TOC Progression Note (Signed)
07/28/2019 Medicare Nursing Home Results [email or web address], [id number]&sort=[id number]&paging=111 1/3 Close window 11 hospitals within 25 miles from the center of [place], [place]. Nursing Home Search Results Results List Table Nursing Home Information Overall Rating Health Inpections Staffing Quality Ratings [place], [place] [address] [phone] Much Above Average Much Above Average Average Average [facility] [place], [place] [address] [phone] Below Average Below Average Below Average Below Average [facility] [place], [place] [address] [phone] Above Average Above Average Below Average Above Average [facility] [place], [place] [address] [phone] Average Average Below Average Average 5 out of 5 stars 5 out of 5 stars 3 out of 5 stars 3 out of 5 stars 2 out of 5 stars 2 out of 5 stars 2 out of 5 stars 2 out of 5 stars 4 out of 5 stars 4 out of 5 stars 2 out of 5 stars 4 out of 5 stars 3 out of 5 stars 3 out of 5 stars 2 out of 5 stars 3 out of 5 stars 07/28/2019 Medicare Nursing Home Results [email or web address], [id number]&sort=[email or web address]&paging=111 2/3 Nursing Home Information Overall Rating Health Inpections Staffing Quality Ratings [facility] [place], [place] [address] [phone] Much Below Average Below Average Much Below Average Average [facility] [place], [place] [address] [phone] Average Average Below Average Below Average [facility] [place], [place]  [address] [phone] Much Below Average Much Below Average Below Average Below Average [facility] [address] [place], [place] [address] [phone] Above Average Average Below Average Much Above Average [facility] [place], [place] [address] [phone] Much Below Average Much Below Average Below Average Average 1 out of 5 stars 2 out of 5 stars 1 out of 5 stars 3 out of 5 stars 3 out of 5 stars 3 out of 5 stars 2 out of 5 stars 2 out of 5 stars 1 out of 5 stars 1 out of 5 stars 2 out of 5 stars 2 out of 5 stars 4 out of 5 stars 3 out of 5 stars 2 out of 5 stars 5 out of 5 stars 1 out of 5 stars 1 out of 5 stars 2 out of 5 stars 3 out of 5 stars 07/28/2019 Medicare Nursing Home Results [email or web address], [id number]&sort=[id number]&paging=111 3/3 [employer] [place], [place] [address] [phone] Much Above Average Much Above Average Average Below Average [place], [place] [address] [phone] Below Average Below Average Below Average Average 5 out of 5 stars 5 out of 5 stars 3 out of 5 stars 2 out of 5 stars 2 out of 5 stars 2 out of 5 stars 2 out of 5 stars 3 out of 5 stars

## 2019-07-28 NOTE — Progress Notes (Signed)
PROGRESS NOTE  Daniel Richards  [id number]  DOB: 09-Dec-1945  DOA: 07/24/2019 PCP: Scotty Court, DO  Brief Admission Hx: 73 year old gentleman with hypertension hyperlipidemia diabetes chronic resting tremor presented with frequent falls at home.  He also had complained of abdominal pain and CT shows some worrisome abdominal lymph nodes suggesting possible malignancy.  MDM/Assessment & Plan:   1. Generalized weakness with recurrent falling at home-patient has been seen by physical therapy and they are recommending SNF placement.  I have spoken with the transitions of care team and they are working on finding placement. Pt/daugher agreeable.  2. Retroperitoneal/pelvic lymphadenopathy- CT abdomen findings are worrisome for malignancy.  I spoke with oncology and they have ordered flow cytometry, PSA not elevated, unfortunately the location of the nodes make biopsy difficult but would defer next steps to oncology team.   Dr Delton Coombes will see today. 3. Chronic urinary retention - he had I/O cath and bladder scan done 9/18, Foley was placed 9/19 with >1000cc output, continue foley for now, started flomax 0.4 mg daily.  4. Hypercalcemia- his calcium levels are improving with IV fluid hydration.  Continue to monitor closely. 5. AKI-continue gentle hydration there has been some improvement in creatinine with fluids. 6. Type 2 diabetes mellitus-continue sliding scale coverage. 7. Chronic resting tremor-continue Mysoline and Topamax and propanolol. 8. Essential hypertension- continue propranolol for blood pressure control.   DVT prophylaxis: Lovenox Code Status: Full Family Communication: daughter/son updated Disposition Plan: SNF when bed available and medically stabilized  Consultants:  Oncology  PT/OT  Procedures:    Antimicrobials:     Subjective: Pt complaining of abdominal pain this morning but IV pain meds have been helping.    Objective: Vitals:   07/27/19 2010  07/27/19 2226 07/28/19 0504 07/28/19 0905  BP:  (!) 151/69 (!) 162/71 134/73  Pulse:  79  92  Resp:  18 18 16   Temp:  99 F (37.2 C) 98.8 F (37.1 C) 98.2 F (36.8 C)  TempSrc:  Oral Oral Oral  SpO2: 95% 97%  91%  Weight:   90.3 kg   Height:        Intake/Output Summary (Last 24 hours) at 07/28/2019 1135 Last data filed at 07/28/2019 0500 Gross per 24 hour  Intake 720 ml  Output 1850 ml  Net -1130 ml   Filed Weights   07/24/19 1329 07/25/19 0210 07/28/19 0504  Weight: 90.7 kg 86.8 kg 90.3 kg   REVIEW OF SYSTEMS  As per history otherwise all reviewed and reported negative  Exam:  General exam: Chronically ill-appearing male sitting up in the bed he is in no apparent distress he is cooperative.  He has a chronic resting tremor Respiratory system: Clear. No increased work of breathing. Cardiovascular system: S1 & S2 heard. No JVD, murmurs, gallops, clicks or pedal edema. Gastrointestinal system: Abdomen is nondistended, soft and generalized tenderness. Mild suprapubic tenderness.  Normal bowel sounds heard. Central nervous system: Alert and oriented. No focal neurological deficits. Extremities: no CCE.  Data Reviewed: Basic Metabolic Panel: Recent Labs  Lab 07/24/19 1551 07/25/19 0608 07/26/19 0558 07/27/19 0552  NA 138 135 139 138  K 3.9 3.8 4.5 4.6  CL 102 102 106 106  CO2 27 25 26 25   GLUCOSE 113* 106* 106* 105*  BUN 24* 22 20 16   CREATININE 1.64* 1.52* 1.45* 1.39*  CALCIUM 10.8* 9.9 10.6* 10.4*  MG  --   --  2.0 2.0   Liver Function Tests: Recent Labs  Lab 07/24/19  1551 07/25/19 0608 07/26/19 0558  AST 18 16 14*  ALT 16 15 14   ALKPHOS 78 72 64  BILITOT 0.6 0.5 0.5  PROT 7.1 6.6 6.2*  ALBUMIN 3.3* 3.0* 2.9*   Recent Labs  Lab 07/24/19 1551  LIPASE 21   No results for input(s): AMMONIA in the last 168 hours. CBC: Recent Labs  Lab 07/24/19 1551 07/25/19 0608 07/26/19 0558 07/27/19 0552  WBC 4.0 3.8* 3.4* 3.1*  NEUTROABS 1.7  --   --   --    HGB 10.1* 9.3* 9.2* 9.1*  HCT 32.2* 29.8* 29.9* 29.9*  MCV 104.9* 103.8* 105.7* 106.0*  PLT 201 179 193 203   Cardiac Enzymes: Recent Labs  Lab 07/24/19 1551  CKTOTAL 34*   CBG (last 3)  Recent Labs    07/27/19 1607 07/27/19 2224 07/28/19 0849  GLUCAP 125* 152* 121*   Recent Results (from the past 240 hour(s))  SARS CORONAVIRUS 2 (TAT 6-24 HRS) Nasopharyngeal Nasopharyngeal Swab     Status: None   Collection Time: 07/24/19  9:53 PM   Specimen: Nasopharyngeal Swab  Result Value Ref Range Status   SARS Coronavirus 2 NEGATIVE NEGATIVE Final    Comment: (NOTE) SARS-CoV-2 target nucleic acids are NOT DETECTED. The SARS-CoV-2 RNA is generally detectable in upper and lower respiratory specimens during the acute phase of infection. Negative results do not preclude SARS-CoV-2 infection, do not rule out co-infections with other pathogens, and should not be used as the sole basis for treatment or other patient management decisions. Negative results must be combined with clinical observations, patient history, and epidemiological information. The expected result is Negative. Fact Sheet for Patients: SugarRoll.be Fact Sheet for Healthcare Providers: https://www.woods-mathews.com/ This test is not yet approved or cleared by the [place] FDA and  has been authorized for detection and/or diagnosis of SARS-CoV-2 by FDA under an Emergency Use Authorization (EUA). This EUA will remain  in effect (meaning this test can be used) for the duration of the COVID-19 declaration under Section 56 4(b)(1) of the Act, 21 U.S.C. section 360bbb-3(b)(1), unless the authorization is terminated or revoked sooner. Performed at [facility], [place] [address], [place], [place] [address]     Studies: No results found. Scheduled Meds: . amLODipine  5 mg Oral Daily  . citalopram  20 mg Oral QHS  . enoxaparin (LOVENOX) injection  40 mg Subcutaneous  Q24H  . feeding supplement (ENSURE ENLIVE)  237 mL Oral BID BM  . gabapentin  600 mg Oral TID  . insulin aspart  0-9 Units Subcutaneous TID WC  . multivitamin with minerals  1 tablet Oral Daily  . primidone  75 mg Oral Q8H  . propranolol  40 mg Oral BID  . tamsulosin  0.4 mg Oral Daily  . topiramate  200 mg Oral QHS   Continuous Infusions: . sodium chloride 50 mL/hr at 07/26/19 1043   Active Problems:   Hypercalcemia   Generalized weakness   Abdominal lymphadenopathy   Retroperitoneal lymphadenopathy   Tremor   Diabetes mellitus   Malnutrition of moderate degree  Time spent:   Irwin Brakeman, MD [facility] 07/28/2019, 11:35 AM    LOS: 3 days  How to contact the [facility] Attending or Consulting provider [place] or covering provider during after hours [place], for this patient?  1. Check the care team in [facility] and look for a) attending/consulting TRH provider listed and b) the [facility] team listed 2. Log into [email or web address] and use [place]'s  universal password to access. If you do not have the password, please contact the hospital operator. 3. Locate the [facility] provider you are looking for under [facility] and page to a number that you can be directly reached. 4. If you still have difficulty reaching the provider, please page the [facility] [address] (Director on Call) for the Hospitalists listed on amion for assistance.

## 2019-07-28 NOTE — Consult Note (Signed)
[facility] Consultation Oncology  Name: Daniel Richards      MRN: [id number]    Location: A301/A301-01  Date: 07/28/2019 Time:4:29 PM   REFERRING PHYSICIAN: Dr. Wynetta Emery  REASON FOR CONSULT: Retroperitoneal lymphadenopathy.   DIAGNOSIS: To evaluate for lymphoproliferative process.  HISTORY OF PRESENT ILLNESS: [name] is a 73 year old very pleasant white male who is seen in consultation today at the request of Dr. Wynetta Emery for further work-up and management of retroperitoneal lymphadenopathy.  He was brought to the hospital after a fall and progressive weakness.  He complained of weakness which is generalized and slightly more weakness in the legs for the past 1 to 2 months.  Patient is a poor historian.  Patient's son and daughter at bedside and supplement history.  Patient lives with his son at home.  He was found to have elevated calcium of 10.6.  A CT of the abdomen and pelvis with contrast on 07/24/2019 showed bulky retroperitoneal and left pelvic lymphadenopathy which is new since CT scan from 2012.  Prostate was also enlarged.  An LDH level was normal at 107.  He denied any fevers, night sweats or significant weight loss in the last 6 months.  He had a remote history of smoking.  He also had worsening of tremor which he had all his life.  This is predominantly in the upper extremities and face.  Denied any nausea, vomiting, diarrhea.  He has baseline constipated at home.  Denies any bleeding per rectum or melena.  Patient's children report that he was confused lately.  A CT scan of the chest done on 07/25/2019 showed enlarged paraesophageal lymph node measuring 2 x 1.4 cm at the level of T6-T7.  Prominent periaortic and retrocrural lymph nodes.  Enlarged left lower cervical/superior mediastinal lymph nodes measuring 1.2 x 1.0 cm.  He denies any loss of bowel or bladder function.  Family history significant for sister with lung cancer and brother with colon cancer.  2 other siblings have also  cancer but the patient does not know the type.  Patient is an ex-smoker, quit at age 23.  He smoked up to 3 packs/day for 20 years.  PAST MEDICAL HISTORY:   Past Medical History:  Diagnosis Date  . Anxiety and depression   . Diabetes mellitus 06/09/2011   pt taking metformin  . DJD of shoulder    right   . Hyperlipidemia   . Hypertension   . Migraines   . Tremor   . Vitamin D deficiency     ALLERGIES: No Known Allergies    MEDICATIONS: I have reviewed the patient's current medications.     PAST SURGICAL HISTORY Past Surgical History:  Procedure Laterality Date  . BACK SURGERY    . KNEE SURGERY    . SHOULDER SURGERY      FAMILY HISTORY: Family History  Problem Relation Age of Onset  . Cancer Sister   . Cancer Sister   . Cancer Brother   . Migraines Neg Hx     SOCIAL HISTORY:  reports that he has never smoked. He has never used smokeless tobacco. He reports that he does not drink alcohol or use drugs.  PERFORMANCE STATUS: The patient's performance status is 2 - Symptomatic, <50% confined to bed  PHYSICAL EXAM: Most Recent Vital Signs: Blood pressure 131/67, pulse 67, temperature 98 F (36.7 C), temperature source Oral, resp. rate 16, height 6' (1.829 m), weight 199 lb 1.2 oz (90.3 kg), SpO2 95 %. BP 131/67 (BP Location:  Right Arm)   Pulse 67   Temp 98 F (36.7 C) (Oral)   Resp 16   Ht 6' (1.829 m)   Wt 199 lb 1.2 oz (90.3 kg)   SpO2 95%   BMI 27.00 kg/m  General appearance: alert, cooperative and appears stated age Neck: no adenopathy, supple, symmetrical, trachea midline and thyroid not enlarged, symmetric, no tenderness/mass/nodules Lungs: clear to auscultation bilaterally Heart: regular rate and rhythm Abdomen: soft, non-tender; bowel sounds normal; no masses,  no organomegaly Extremities: extremities normal, atraumatic, no cyanosis or edema Skin: Skin color, texture, turgor normal. No rashes or lesions Lymph nodes: Cervical, supraclavicular, and  axillary nodes normal. Neurologic: Grossly normal  LABORATORY DATA:  Results for orders placed or performed during the hospital encounter of 07/24/19 (from the past 48 hour(s))  Glucose, capillary     Status: Abnormal   Collection Time: 07/26/19  4:34 PM  Result Value Ref Range   Glucose-Capillary 108 (H) 70 - 99 mg/dL   Comment 1 Notify RN    Comment 2 Document in Chart   Basic metabolic panel     Status: Abnormal   Collection Time: 07/27/19  5:52 AM  Result Value Ref Range   Sodium 138 135 - 145 mmol/L   Potassium 4.6 3.5 - 5.1 mmol/L   Chloride 106 98 - 111 mmol/L   CO2 25 22 - 32 mmol/L   Glucose, Bld 105 (H) 70 - 99 mg/dL   BUN 16 8 - 23 mg/dL   Creatinine, Ser 1.39 (H) 0.61 - 1.24 mg/dL   Calcium 10.4 (H) 8.9 - 10.3 mg/dL   GFR calc non Af Amer 49 (L) >60 mL/min   GFR calc Af Amer 57 (L) >60 mL/min   Anion gap 7 5 - 15    Comment: Performed at [facility], [address] [place], [place] [address]  Magnesium     Status: None   Collection Time: 07/27/19  5:52 AM  Result Value Ref Range   Magnesium 2.0 1.7 - 2.4 mg/dL    Comment: Performed at [facility] [address], [place], [place] [address]  CBC     Status: Abnormal   Collection Time: 07/27/19  5:52 AM  Result Value Ref Range   WBC 3.1 (L) 4.0 - 10.5 K/uL   RBC 2.82 (L) 4.22 - 5.81 MIL/uL   Hemoglobin 9.1 (L) 13.0 - 17.0 g/dL   HCT 29.9 (L) 39.0 - 52.0 %   MCV 106.0 (H) 80.0 - 100.0 fL   MCH 32.3 26.0 - 34.0 pg   MCHC 30.4 30.0 - 36.0 g/dL   RDW 15.8 (H) 11.5 - 15.5 %   Platelets 203 150 - 400 K/uL   nRBC 0.0 0.0 - 0.2 %    Comment: Performed at [facility], [address] [place], [place] [address]  Glucose, capillary     Status: None   Collection Time: 07/27/19  7:23 AM  Result Value Ref Range   Glucose-Capillary 95 70 - 99 mg/dL   Comment 1 Notify RN    Comment 2 Document in Chart   Glucose, capillary     Status: Abnormal   Collection Time: 07/27/19 11:19 AM  Result Value Ref Range    Glucose-Capillary 159 (H) 70 - 99 mg/dL   Comment 1 Notify RN    Comment 2 Document in Chart   Glucose, capillary     Status: Abnormal   Collection Time: 07/27/19  4:07 PM  Result Value Ref Range  Glucose-Capillary 125 (H) 70 - 99 mg/dL  Glucose, capillary     Status: Abnormal   Collection Time: 07/27/19 10:24 PM  Result Value Ref Range   Glucose-Capillary 152 (H) 70 - 99 mg/dL   Comment 1 Notify RN    Comment 2 Document in Chart   Glucose, capillary     Status: Abnormal   Collection Time: 07/28/19  8:49 AM  Result Value Ref Range   Glucose-Capillary 121 (H) 70 - 99 mg/dL  Glucose, capillary     Status: Abnormal   Collection Time: 07/28/19 11:38 AM  Result Value Ref Range   Glucose-Capillary 110 (H) 70 - 99 mg/dL      RADIOGRAPHY: I have independently reviewed images of CT of the chest and abdomen and pelvis.     ASSESSMENT and PLAN:  1.  Generalized lymphadenopathy: - 24-month history of progressive weakness.  Denies any fevers, night sweats or weight loss. - CT of the abdomen and pelvis on 07/24/2019 shows bulky retroperitoneal adenopathy extending from the level of adrenal glands into the pelvis.  Note at the level of midpole of the left kidney, left periaortic measures 4.3 cm in short axis.  Left periaortic node at the level of the bifurcation measures 4.3 cm in short axis.  Bulky left pelvic adenopathy measuring 7.4 x 4.6.  Left external iliac chain lymph node measures 7.2 x 3.5 cm.  No mesenteric adenopathy.  No enlarged inguinal lymph nodes. -CT of the chest shows enlarged paraesophageal lymph node measuring 2 x 1.4 cm.  Prominent periaortic and retrocrural lymph nodes.  Enlarged left lower cervical/superior mediastinal nodes measuring 1.2 x 1.0 cm. - LDH is within normal limits. - I have recommended a whole-body PET CT scan.  I have also recommended CT-guided biopsy of the retroperitoneal lymph nodes.  No lymphadenopathy accessible for excisional biopsy peripherally. -I  would be glad to see him back in the office if he is discharged to a rehab facility. -I did not perceive any severe weakness in the lower extremities.  If there is any weakness in the legs or if he cannot walk, consider doing MRI of the thoracic and lumbar spine.  2.  Hypercalcemia: - Patient presented with calcium level of 10.6.  Albumin is 3.  This is slightly improved with IV fluids but has started going back up again. -This could be related to underlying lymphoproliferative process.  Please check intact PTH and PTH RP. - Would recommend treatment with bisphosphonate (Aredia or Zometa).  3.  [id number] and folic acid deficiency: -Patient has microcytic anemia.  Would recommend vitamin B12 1 mg injection followed by B12 1 mg tablet daily along with folic acid 1 mg tablet daily.  All questions were answered. The patient knows to call the clinic with any problems, questions or concerns. We can certainly see the patient much sooner if necessary.    Derek Jack

## 2019-07-28 NOTE — Care Management Important Message (Signed)
Important Message  Patient Details  Name: Daniel Richards MRN: [id number] Date of Birth: August 11, 1946   Medicare Important Message Given:  Yes     [name] 07/28/2019, 10:59 AM

## 2019-07-28 NOTE — NC FL2 (Signed)
[place] LEVEL OF CARE SCREENING TOOL     IDENTIFICATION  Patient Name: Daniel Richards Birthdate: 01-11-1946 Sex: male Admission Date (Current Location): 07/24/2019  [facility] and [place] Number:  [employer] and Address:  [facility] [address] [place]      Provider Number: [id number]  Attending Physician Name and Address:  Murlean Iba, MD  Relative Name and Phone Number:       Current Level of Care: Hospital Recommended Level of Care: [place] Prior Approval Number:    Date Approved/Denied:   PASRR Number:    Discharge Plan: SNF    Current Diagnoses: Patient Active Problem List   Diagnosis Date Noted  . Generalized weakness 07/25/2019  . Abdominal lymphadenopathy 07/25/2019  . Retroperitoneal lymphadenopathy 07/25/2019  . Malnutrition of moderate degree 07/25/2019  . Tremor   . Hypercalcemia 07/24/2019  . B12 deficiency 11/18/2016  . Weakness generalized 11/16/2016  . Altered mental status, unspecified 11/16/2016  . Fracture, rib 11/16/2016  . Weakness 11/16/2016  . New onset of headaches after age 64 09/01/2015  . Morning headache 09/01/2015  . Daytime somnolence 09/01/2015  . Essential tremor 09/01/2015  . Intractable headache 09/01/2015  . Diabetes mellitus 06/09/2011    Orientation RESPIRATION BLADDER Height & Weight     Self, Time, Situation, Place  Normal Incontinent Weight: 199 lb 1.2 oz (90.3 kg) Height:  6' (182.9 cm)  BEHAVIORAL SYMPTOMS/MOOD NEUROLOGICAL BOWEL NUTRITION STATUS      Continent Diet(see dc summary)  AMBULATORY STATUS COMMUNICATION OF NEEDS Skin   Extensive Assist Verbally Normal                       Personal Care Assistance Level of Assistance  Bathing, Dressing, Feeding Bathing Assistance: Limited assistance Feeding assistance: Independent Dressing Assistance: Limited assistance     Functional Limitations Info  Sight, Hearing, Speech  Sight Info: Adequate Hearing Info: Adequate Speech Info: Adequate    SPECIAL CARE FACTORS FREQUENCY  PT (By licensed PT), OT (By licensed OT)     PT Frequency: 5 times week OT Frequency: 3 times week            Contractures Contractures Info: Not present    Additional Factors Info  Code Status, Allergies, Psychotropic Code Status Info: DNR Allergies Info: NKA Psychotropic Info: Celexa         Current Medications (07/28/2019):  This is the current hospital active medication list Current Facility-Administered Medications  Medication Dose Route Frequency Provider Last Rate Last Dose  . 0.9 %  sodium chloride infusion   Intravenous Continuous Johnson, Clanford L, MD 50 mL/hr at 07/26/19 1043    . amLODipine (NORVASC) tablet 5 mg  5 mg Oral Daily Johnson, Clanford L, MD   5 mg at 07/28/19 0913  . citalopram (CELEXA) tablet 20 mg  20 mg Oral QHS Oswald Hillock, MD   20 mg at 07/27/19 2239  . enoxaparin (LOVENOX) injection 40 mg  40 mg Subcutaneous Q24H Oswald Hillock, MD   40 mg at 07/28/19 0912  . feeding supplement (ENSURE ENLIVE) (ENSURE ENLIVE) liquid 237 mL  237 mL Oral BID BM Johnson, Clanford L, MD   237 mL at 07/27/19 0827  . gabapentin (NEURONTIN) capsule 600 mg  600 mg Oral TID Oswald Hillock, MD   600 mg at 07/28/19 0912  . hydrALAZINE (APRESOLINE) tablet 25 mg  25 mg Oral Q6H PRN [place],  Marge Duncans, MD      . HYDROcodone-acetaminophen [facility]) 10-325 MG per tablet 1 tablet  1 tablet Oral Q4H PRN Murlean Iba, MD   1 tablet at 07/28/19 0726  . insulin aspart (novoLOG) injection 0-9 Units  0-9 Units Subcutaneous TID WC Oswald Hillock, MD   1 Units at 07/28/19 [phone]  . multivitamin with minerals tablet 1 tablet  1 tablet Oral Daily Wynetta Emery, Clanford L, MD   1 tablet at 07/28/19 0913  . ondansetron (ZOFRAN) tablet 4 mg  4 mg Oral Q6H PRN Oswald Hillock, MD       Or  . ondansetron [facility]) injection 4 mg  4 mg Intravenous Q6H PRN Oswald Hillock, MD   4 mg at 07/28/19 0726  .  primidone (MYSOLINE) tablet 75 mg  75 mg Oral Q8H Oswald Hillock, MD   75 mg at 07/28/19 0415  . propranolol (INDERAL) tablet 40 mg  40 mg Oral BID Oswald Hillock, MD   40 mg at 07/28/19 0957  . tamsulosin (FLOMAX) capsule 0.4 mg  0.4 mg Oral Daily Oswald Hillock, MD   0.4 mg at 07/28/19 0912  . topiramate (TOPAMAX) tablet 200 mg  200 mg Oral QHS Oswald Hillock, MD   200 mg at 07/27/19 2238     Discharge Medications: Please see discharge summary for a list of discharge medications.  Relevant Imaging Results:  Relevant Lab Results:   Additional Information SSN: [place], LCSW

## 2019-07-28 NOTE — TOC Initial Note (Signed)
Transition of Care [facility]) - Initial/Assessment Note    Patient Details  Name: Daniel Richards MRN: [id number] Date of Birth: [birth year]  Transition of Care [facility]) CM/SW Contact:    Shade Flood, LCSW Phone Number: 07/28/2019, 10:23 AM  Clinical Narrative:                  Pt admitted from home. PT recommending SNF rehab. Spoke with pt about recommendation and he is agreeable. Will refer to requested facilities and follow for SNF placement.   Expected Discharge Plan: Skilled Nursing Facility Barriers to Discharge: Continued Medical Work up   Patient Goals and CMS Choice Patient states their goals for this hospitalization and ongoing recovery are:: Rehab and return home CMS Medicare.gov Compare Post Acute Care list provided to:: Patient Choice offered to / list presented to : Patient  Expected Discharge Plan and Services Expected Discharge Plan: [place] In-house Referral: Clinical Social Work   Post Acute Care Choice: [place] Living arrangements for the past 2 months: [place]                                      Prior Living Arrangements/Services Living arrangements for the past 2 months: Single Family Home Lives with:: Self Patient language and need for interpreter reviewed:: Yes Do you feel safe going back to the place where you live?: Yes      Need for Family Participation in Patient Care: No (Comment) Care giver support system in place?: Yes (comment)   Criminal Activity/Legal Involvement Pertinent to Current Situation/Hospitalization: No - Comment as needed  Activities of Daily Living Home Assistive Devices/Equipment: Dentures (specify type), Eyeglasses, Cane (specify quad or straight), Walker (specify type), CBG Meter ADL Screening (condition at time of admission) Patient's cognitive ability adequate to safely complete daily activities?: Yes Is the patient deaf or have difficulty hearing?: No Does the patient have  difficulty seeing, even when wearing glasses/contacts?: No Does the patient have difficulty concentrating, remembering, or making decisions?: No Patient able to express need for assistance with ADLs?: Yes Does the patient have difficulty dressing or bathing?: Yes Independently performs ADLs?: Yes (appropriate for developmental age) Does the patient have difficulty walking or climbing stairs?: Yes Weakness of Legs: Both Weakness of Arms/Hands: None  Permission Sought/Granted Permission sought to share information with : Chartered certified accountant granted to share information with : Yes, Verbal Permission Granted     Permission granted to share info w AGENCY: SNF's        Emotional Assessment Appearance:: Appears stated age Attitude/Demeanor/Rapport: Engaged Affect (typically observed): Pleasant Orientation: : Oriented to Self, Oriented to Place, Oriented to  Time, Oriented to Situation Alcohol / Substance Use: Not Applicable Psych Involvement: No (comment)  Admission diagnosis:  Falls frequently [R29.6] Generalized abdominal pain [R10.84] Intra-abdominal lymphadenopathy [R59.0] Acute kidney injury ([place]) [N17.9] Patient Active Problem List   Diagnosis Date Noted  . Generalized weakness 07/25/2019  . Abdominal lymphadenopathy 07/25/2019  . Retroperitoneal lymphadenopathy 07/25/2019  . Malnutrition of moderate degree 07/25/2019  . Tremor   . Hypercalcemia 07/24/2019  . B12 deficiency 11/18/2016  . Weakness generalized 11/16/2016  . Altered mental status, unspecified 11/16/2016  . Fracture, rib 11/16/2016  . Weakness 11/16/2016  . New onset of headaches after age 10 09/01/2015  . Morning headache 09/01/2015  . Daytime somnolence 09/01/2015  . Essential tremor 09/01/2015  . Intractable headache 09/01/2015  .  Diabetes mellitus 06/09/2011   PCP:  Scotty Court, DO Pharmacy:   [place], [place] [address] Phone: [phone] Fax: [phone]     Social Determinants of Health (SDOH) Interventions    Readmission Risk Interventions Readmission Risk Prevention Plan 07/28/2019  Medication Screening Complete  Transportation Screening Complete  Some recent data might be hidden

## 2019-07-29 LAB — GLUCOSE, CAPILLARY
Glucose-Capillary: 123 mg/dL — ABNORMAL HIGH (ref 70–99)
Glucose-Capillary: 126 mg/dL — ABNORMAL HIGH (ref 70–99)
Glucose-Capillary: 130 mg/dL — ABNORMAL HIGH (ref 70–99)
Glucose-Capillary: 105 mg/dL — ABNORMAL HIGH (ref 70–99)

## 2019-07-29 LAB — BASIC METABOLIC PANEL
Anion gap: 4 — ABNORMAL LOW (ref 5–15)
BUN: 17 mg/dL (ref 8–23)
CO2: 27 mmol/L (ref 22–32)
Calcium: 10.2 mg/dL (ref 8.9–10.3)
Chloride: 108 mmol/L (ref 98–111)
Creatinine, Ser: 1.51 mg/dL — ABNORMAL HIGH (ref 0.61–1.24)
GFR calc Af Amer: 52 mL/min — ABNORMAL LOW (ref >60)
GFR calc non Af Amer: 45 mL/min — ABNORMAL LOW (ref >60)
Glucose, Bld: 137 mg/dL — ABNORMAL HIGH (ref 70–99)
Potassium: 4.3 mmol/L (ref 3.5–5.1)
Sodium: 139 mmol/L (ref 135–145)

## 2019-07-29 LAB — CBC
HCT: 30.9 % — ABNORMAL LOW (ref 39.0–52.0)
Hemoglobin: 9.6 g/dL — ABNORMAL LOW (ref 13.0–17.0)
MCH: 32.3 pg (ref 26.0–34.0)
MCHC: 31.1 g/dL (ref 30.0–36.0)
MCV: 104 fL — ABNORMAL HIGH (ref 80.0–100.0)
Platelets: 236 10*3/uL (ref 150–400)
RBC: 2.97 MIL/uL — ABNORMAL LOW (ref 4.22–5.81)
RDW: 15.9 % — ABNORMAL HIGH (ref 11.5–15.5)
WBC: 3.5 10*3/uL — ABNORMAL LOW (ref 4.0–10.5)
nRBC: 0 % (ref 0.0–0.2)

## 2019-07-29 LAB — PROTIME-INR
INR: 1.2 (ref 0.8–1.2)
Prothrombin Time: 15.1 seconds (ref 11.4–15.2)

## 2019-07-29 LAB — ALBUMIN: Albumin: 2.7 g/dL — ABNORMAL LOW (ref 3.5–5.0)

## 2019-07-29 MED ORDER — ENOXAPARIN SODIUM 40 MG/0.4ML ~~LOC~~ SOLN: 40.0000 mg | SUBCUTANEOUS | Status: DC

## 2019-07-29 MED ORDER — MORPHINE SULFATE (PF) 2 MG/ML IV SOLN
2.0000 mg | INTRAVENOUS | Status: AC | PRN
  Administered 2019-07-29 (×3): 2 mg via INTRAVENOUS
  Filled 2019-07-29 (×3): qty 1

## 2019-07-29 NOTE — Progress Notes (Signed)
PROGRESS NOTE  Daniel Richards  [id number]  DOB: 08-01-1946  DOA: 07/24/2019 PCP: Scotty Court, DO  Brief Admission Hx: 74 year old gentleman with hypertension hyperlipidemia diabetes chronic resting tremor presented with frequent falls at home.  He also had complained of abdominal pain and CT shows some worrisome abdominal lymph nodes suggesting possible malignancy.  MDM/Assessment & Plan:   1. Generalized weakness with recurrent falling at home-patient has been seen by physical therapy and they are recommending SNF placement.  I have spoken with the transitions of care team and he likely can discharge to [facility] when he is medically stable. Pt/daugher agreeable.  2. Retroperitoneal/pelvic lymphadenopathy- CT abdomen findings are worrisome for malignancy. Appreciate Dr. Tomie China expert recommendations.  The tentative plan is for IR to do CT guided biopsy 9/23 at [facility] and return to [facility].  Follow up with Dr. Delton Coombes outpatient for further planning and staging.  PET scan will be ordered by Dr. Raliegh Ip on outpatient follow up.  3. Hypercalcemia of malignancy - Pt was treated with IV hydration and Zometa and calcitonin and the calcium is coming down with therapy.   4. Chronic urinary retention - Will do void trial today.  DC foley.  Started flomax 0.4 mg daily.  5. AKI-creatinine is improving with IV fluid hydration. 6. Type 2 diabetes mellitus-continue sliding scale coverage. Blood sugars are controlled.  7. Chronic resting tremor-continue Mysoline and Topamax and propanolol. 8. Essential hypertension- continue propranolol for blood pressure control.   DVT prophylaxis: Lovenox Code Status: Full Family Communication: daughter/son updated Disposition Plan: SNF in the next 1-2 days as able. Pt has been accepted to [facility].    Consultants:  Oncology  PT/OT  Procedures:    Antimicrobials:     Subjective: Pt complaining of knee pain and occasional abdominal  pain but controlled with morphine injection.    Objective: Vitals:   07/28/19 1944 07/28/19 2044 07/29/19 0602 07/29/19 0809  BP:  139/76 123/65   Pulse:  76 83   Resp:  18 16   Temp:  98.4 F (36.9 C) 98.8 F (37.1 C)   TempSrc:  Oral Oral   SpO2: 95% 96% 91% 92%  Weight:      Height:        Intake/Output Summary (Last 24 hours) at 07/29/2019 1022 Last data filed at 07/29/2019 0835 Gross per 24 hour  Intake 4402.01 ml  Output 3800 ml  Net 602.01 ml   Filed Weights   07/24/19 1329 07/25/19 0210 07/28/19 0504  Weight: 90.7 kg 86.8 kg 90.3 kg   REVIEW OF SYSTEMS  As per history otherwise all reviewed and reported negative  Exam:  General exam: Chronically ill-appearing male sitting up in the bed he is in no apparent distress he is cooperative.  He has a chronic resting tremor Respiratory system: Clear. No increased work of breathing. Cardiovascular system: S1 & S2 heard. No JVD, murmurs, gallops, clicks or pedal edema. Gastrointestinal system: Abdomen is nondistended, soft and generalized tenderness. Mild suprapubic tenderness.  Normal bowel sounds heard. Central nervous system: Alert and oriented. No focal neurological deficits. Extremities: no CCE.  Data Reviewed: Basic Metabolic Panel: Recent Labs  Lab 07/24/19 1551 07/25/19 0608 07/26/19 0558 07/27/19 0552 07/29/19 0630  NA 138 135 139 138 139  K 3.9 3.8 4.5 4.6 4.3  CL 102 102 106 106 108  CO2 27 25 26 25 27   GLUCOSE 113* 106* 106* 105* 137*  BUN 24* 22 20 16 17   CREATININE  1.64* 1.52* 1.45* 1.39* 1.51*  CALCIUM 10.8* 9.9 10.6* 10.4* 10.2  MG  --   --  2.0 2.0  --    Liver Function Tests: Recent Labs  Lab 07/24/19 1551 07/25/19 0608 07/26/19 0558 07/29/19 0630  AST 18 16 14*  --   ALT 16 15 14   --   ALKPHOS 78 72 64  --   BILITOT 0.6 0.5 0.5  --   PROT 7.1 6.6 6.2*  --   ALBUMIN 3.3* 3.0* 2.9* 2.7*   Recent Labs  Lab 07/24/19 1551  LIPASE 21   No results for input(s): AMMONIA in the last  168 hours. CBC: Recent Labs  Lab 07/24/19 1551 07/25/19 0608 07/26/19 0558 07/27/19 0552 07/29/19 0630  WBC 4.0 3.8* 3.4* 3.1* 3.5*  NEUTROABS 1.7  --   --   --   --   HGB 10.1* 9.3* 9.2* 9.1* 9.6*  HCT 32.2* 29.8* 29.9* 29.9* 30.9*  MCV 104.9* 103.8* 105.7* 106.0* 104.0*  PLT 201 179 193 203 236   Cardiac Enzymes: Recent Labs  Lab 07/24/19 1551  CKTOTAL 34*   CBG (last 3)  Recent Labs    07/28/19 1640 07/28/19 2045 07/29/19 0805  GLUCAP 135* 163* 123*   Recent Results (from the past 240 hour(s))  SARS CORONAVIRUS 2 (TAT 6-24 HRS) Nasopharyngeal Nasopharyngeal Swab     Status: None   Collection Time: 07/24/19  9:53 PM   Specimen: Nasopharyngeal Swab  Result Value Ref Range Status   SARS Coronavirus 2 NEGATIVE NEGATIVE Final    Comment: (NOTE) SARS-CoV-2 target nucleic acids are NOT DETECTED. The SARS-CoV-2 RNA is generally detectable in upper and lower respiratory specimens during the acute phase of infection. Negative results do not preclude SARS-CoV-2 infection, do not rule out co-infections with other pathogens, and should not be used as the sole basis for treatment or other patient management decisions. Negative results must be combined with clinical observations, patient history, and epidemiological information. The expected result is Negative. Fact Sheet for Patients: SugarRoll.be Fact Sheet for Healthcare Providers: https://www.woods-mathews.com/ This test is not yet approved or cleared by the [place] FDA and  has been authorized for detection and/or diagnosis of SARS-CoV-2 by FDA under an Emergency Use Authorization (EUA). This EUA will remain  in effect (meaning this test can be used) for the duration of the COVID-19 declaration under Section 56 4(b)(1) of the Act, 21 U.S.C. section 360bbb-3(b)(1), unless the authorization is terminated or revoked sooner. Performed at [facility], [place] [address], [place], [place] [address]     Studies: No results found. Scheduled Meds: . amLODipine  5 mg Oral Daily  . calcitonin  4 Units/kg Subcutaneous BID  . Chlorhexidine Gluconate Cloth  6 each Topical Daily  . citalopram  20 mg Oral QHS  . [START ON 07/31/2019] enoxaparin (LOVENOX) injection  40 mg Subcutaneous Q24H  . feeding supplement (ENSURE ENLIVE)  237 mL Oral BID BM  . folic acid  1 mg Oral Daily  . gabapentin  600 mg Oral TID  . insulin aspart  0-9 Units Subcutaneous TID WC  . multivitamin with minerals  1 tablet Oral Daily  . primidone  75 mg Oral Q8H  . propranolol  40 mg Oral BID  . tamsulosin  0.4 mg Oral Daily  . topiramate  200 mg Oral QHS  . vitamin B-12  1,000 mcg Oral Daily   Continuous Infusions: . sodium chloride 50 mL/hr at 07/29/19 [id number]   Active  Problems:   Hypercalcemia   Generalized weakness   Intra-abdominal lymphadenopathy   Retroperitoneal lymphadenopathy   Tremor   Diabetes mellitus   Malnutrition of moderate degree   Folate deficiency  Time spent:   Irwin Brakeman, MD [facility] 07/29/2019, 10:22 AM    LOS: 4 days  How to contact the [facility] Attending or Consulting provider [place] or covering provider during after hours [place], for this patient?  1. Check the care team in [facility] and look for a) attending/consulting TR[facility] provider listed and b) the [facility] team listed 2. Log into [email or web address] and use [place]'s universal password to access. If you do not have the password, please contact the hospital operator. 3. Locate the [facility] provider you are looking for under [facility] and page to a number that you can be directly reached. 4. If you still have difficulty reaching the provider, please page the [facility] (Director on Call) for the Hospitalists listed on amion for assistance.

## 2019-07-29 NOTE — Progress Notes (Signed)
Patient ID: Daniel Richards, male   DOB: 11-27-1945, 73 y.o.   MRN: [id number]   Order received for LAN bx  Pt is scheduled at [facility] for 9/23  Pt to be at [place] 900 am 9/23 via ambulance (RN to arrange) RN aware

## 2019-07-29 NOTE — Progress Notes (Signed)
Physical Therapy Note  Patient Details  Name: Daniel Richards MRN: [id number] Date of Birth: 05-Feb-1946 Today's Date: 07/29/2019    Pt refused treatment today with no reason given  Teena Irani, PTA/CLT [place], Castiel Lauricella B 07/29/2019, 3:08 PM

## 2019-07-30 ENCOUNTER — Inpatient Hospital Stay (HOSPITAL_COMMUNITY): Admit: 2019-07-30 | Payer: Medicare Other

## 2019-07-30 ENCOUNTER — Ambulatory Visit (HOSPITAL_COMMUNITY)
Admit: 2019-07-30 | Discharge: 2019-07-30 | Disposition: A | Discharge status: Home / Self Care | Payer: Medicare Other | Source: Ambulatory Visit | Attending: Family Medicine | Admitting: Family Medicine

## 2019-07-30 ENCOUNTER — Encounter: Payer: Self-pay | Admitting: Hematology

## 2019-07-30 ENCOUNTER — Encounter (HOSPITAL_COMMUNITY): Payer: Self-pay | Admitting: Primary Care

## 2019-07-30 ENCOUNTER — Inpatient Hospital Stay (HOSPITAL_COMMUNITY): Payer: Medicare Other

## 2019-07-30 DIAGNOSIS — R296 Repeated falls: Secondary | ICD-10-CM

## 2019-07-30 DIAGNOSIS — R590 Localized enlarged lymph nodes: Secondary | ICD-10-CM | POA: Insufficient documentation

## 2019-07-30 DIAGNOSIS — C8513 Unspecified B-cell lymphoma, intra-abdominal lymph nodes: Secondary | ICD-10-CM | POA: Insufficient documentation

## 2019-07-30 DIAGNOSIS — Z515 Encounter for palliative care: Secondary | ICD-10-CM

## 2019-07-30 DIAGNOSIS — R1084 Generalized abdominal pain: Secondary | ICD-10-CM

## 2019-07-30 DIAGNOSIS — Z7189 Other specified counseling: Secondary | ICD-10-CM

## 2019-07-30 LAB — PTH, INTACT AND CALCIUM
Calcium, Total (PTH): 10.1 mg/dL (ref 8.6–10.2)
PTH: 14 pg/mL — ABNORMAL LOW (ref 15–65)

## 2019-07-30 LAB — GLUCOSE, CAPILLARY
Glucose-Capillary: 128 mg/dL — ABNORMAL HIGH (ref 70–99)
Glucose-Capillary: 129 mg/dL — ABNORMAL HIGH (ref 70–99)
Glucose-Capillary: 101 mg/dL — ABNORMAL HIGH (ref 70–99)
Glucose-Capillary: 110 mg/dL — ABNORMAL HIGH (ref 70–99)

## 2019-07-30 MED ORDER — MIDAZOLAM HCL 2 MG/2ML IJ SOLN
INTRAMUSCULAR | Status: AC
  Filled 2019-07-30: qty 4

## 2019-07-30 MED ORDER — MIDAZOLAM HCL 2 MG/2ML IJ SOLN
INTRAMUSCULAR | Status: AC | PRN
  Administered 2019-07-30: 1 mg via INTRAVENOUS

## 2019-07-30 MED ORDER — ENOXAPARIN SODIUM 40 MG/0.4ML ~~LOC~~ SOLN
40.0000 mg | SUBCUTANEOUS | Status: DC
  Administered 2019-07-31 – 2019-08-01 (×2): 40 mg via SUBCUTANEOUS
  Filled 2019-07-30 (×2): qty 0.4

## 2019-07-30 MED ORDER — SODIUM CHLORIDE 0.9 % IV SOLN
INTRAVENOUS | Status: DC
  Administered 2019-07-31 – 2019-08-01 (×2): via INTRAVENOUS

## 2019-07-30 MED ORDER — FENTANYL CITRATE (PF) 100 MCG/2ML IJ SOLN
INTRAMUSCULAR | Status: AC | PRN
  Administered 2019-07-30: 50 ug via INTRAVENOUS

## 2019-07-30 MED ORDER — FENTANYL CITRATE (PF) 100 MCG/2ML IJ SOLN
INTRAMUSCULAR | Status: AC
  Filled 2019-07-30: qty 4

## 2019-07-30 NOTE — Consult Note (Signed)
Chief Complaint: Patient was seen in consultation today for retroperitoneal lymph node biopsy Chief Complaint  Patient presents with   Fall   at the request of Dr Delton Coombes   Supervising Physician: Jacqulynn Cadet  Patient Status: [facility] IP  History of Present Illness: Daniel Richards is a 73 y.o. male   Hx HTN; HLD; DM Chronic tremor Admitted through ED 9/17 General weakness x 2 months-  and fall at home Decreased appetite  Work up reveals Lymphadenopathy Hypercalcemia  CT 9/17: IMPRESSION: 1. There is bulky retroperitoneal and left pelvic lymphadenopathy that has developed since the prior CT. Adenopathy may occlude the left common and/or external iliac vein. Differential diagnosis includes lymphoma and other lymphoproliferative disorders. Consider the possibility of prostate carcinoma metastatic to lymph nodes. 2. No acute findings within the abdomen or pelvis. 3. Bilateral intrarenal stones. Dependent bladder stones as well as a small amount of dependent bladder debris. 4. Bilateral renal cortical thinning. 5. Aortic atherosclerosis.  IMPRESSION: 1. There is an enlarged paraesophageal lymph node measuring 2.0 x 1.4 cm at the level of T6-T7 (series 2, image 71). There are prominent periaortic and retrocrural nodes lower in the thorax (series 2, image 135, 153). 2. There are enlarged left lower cervical/superior mediastinal lymph nodes measuring up to 1.2 x 1.0 cm (series 2, image 7). 3.  Coronary artery disease.  Aortic Atherosclerosis (ICD10-I70.0).  Request made for RP LN bx Dr Pascal Lux reviewed imaging and approved procedure  Past Medical History:  Diagnosis Date   Anxiety and depression    Diabetes mellitus 06/09/2011   pt taking metformin   DJD of shoulder    right    Hyperlipidemia    Hypertension    Migraines    Tremor    Vitamin D deficiency     Past Surgical History:  Procedure Laterality Date   BACK SURGERY     KNEE SURGERY       SHOULDER SURGERY      Allergies: Patient has no known allergies.  Medications: Prior to Admission medications   Medication Sig Start Date End Date Taking? Authorizing Provider  calcitRIOL (ROCALTROL) 0.5 MCG capsule Take 1 capsule by mouth daily. 05/20/19  Yes [provider]  citalopram (CELEXA) 20 MG tablet Take 20 mg by mouth at bedtime.   Yes [provider]  gabapentin (NEURONTIN) 300 MG capsule Take 600 mg by mouth 3 (three) times daily.    Yes [provider]  INVOKANA 100 MG TABS tablet Take 1 tablet by mouth daily. 07/17/19  Yes [provider]  lisinopril (PRINIVIL,ZESTRIL) 20 MG tablet Take 20 mg by mouth at bedtime.    Yes [provider]  metFORMIN (GLUCOPHAGE) 500 MG tablet Take 1 tablet by mouth daily. 07/02/19  Yes [provider]  primidone (MYSOLINE) 50 MG tablet Take 1.5 tablets (75 mg total) by mouth every 8 (eight) hours. Patient taking differently: Take 150 mg by mouth every 8 (eight) hours.  11/18/16  Yes Isaac Bliss, Rayford Halsted, MD  propranolol (INDERAL) 40 MG tablet Take 40 mg by mouth 2 (two) times daily.   Yes [provider]  tamsulosin (FLOMAX) 0.4 MG CAPS capsule Take 1 capsule by mouth daily. 07/02/19  Yes [provider]  topiramate (TOPAMAX) 100 MG tablet Take 200 mg by mouth at bedtime.   Yes [provider]     Family History  Problem Relation Age of Onset   Cancer Sister    Cancer Sister    Cancer  Brother    Migraines Neg Hx     Social History   Socioeconomic History   Marital status: Divorced    Spouse name: Not on file   Number of children: 2   Years of education: 12+   Highest education level: Not on file  Occupational History   Not on file  Social Needs   Financial resource strain: Not on file   Food insecurity    Worry: Not on file    Inability: Not on file   Transportation needs    Medical: Not on file    Non-medical: Not on file   Tobacco Use   Smoking status: Never Smoker   Smokeless tobacco: Never Used  Substance and Sexual Activity   Alcohol use: No   Drug use: No   Sexual activity: Not on file  Lifestyle   Physical activity    Days per week: Not on file    Minutes per session: Not on file   Stress: Not on file  Relationships   Social connections    Talks on phone: Not on file    Gets together: Not on file    Attends religious service: Not on file    Active member of club or organization: Not on file    Attends meetings of clubs or organizations: Not on file    Relationship status: Not on file  Other Topics Concern   Not on file  Social History Narrative   Lives at home with son.   Caffeine use: Drinks 1 cup coffee (3 cups per week-decaf)    Review of Systems: A 12 point ROS discussed and pertinent positives are indicated in the HPI above.  All other systems are negative.  Review of Systems  Constitutional: Positive for activity change and fatigue. Negative for fever.  Respiratory: Negative for cough and shortness of breath.   Cardiovascular: Negative for chest pain.  Gastrointestinal: Negative for abdominal pain.  Neurological: Positive for tremors.  Psychiatric/Behavioral: Negative for behavioral problems and confusion.    Vital Signs: BP (!) 152/65 (BP Location: Right Arm)    Pulse 80    Temp 98.7 F (37.1 C) (Oral)    Resp 17    Ht 6' (1.829 m)    Wt 199 lb 1.2 oz (90.3 kg)    SpO2 94%    BMI 27.00 kg/m   Physical Exam Vitals signs reviewed.  Cardiovascular:     Rate and Rhythm: Normal rate and regular rhythm.     Heart sounds: Normal heart sounds.  Pulmonary:     Breath sounds: Normal breath sounds.  Abdominal:     Palpations: Abdomen is soft.  Musculoskeletal: Normal range of motion.  Skin:    General: Skin is warm and dry.  Neurological:     Mental Status: He is alert and oriented to person, place, and time.     Comments: Chronic resting tremors  Psychiatric:         Mood and Affect: Mood normal.        Behavior: Behavior normal.        Thought Content: Thought content normal.        Judgment: Judgment normal.     Imaging: Ct Head Wo Contrast  Result Date: 07/24/2019 CLINICAL DATA:  Trauma. EXAM: CT HEAD WITHOUT CONTRAST CT CERVICAL SPINE WITHOUT CONTRAST TECHNIQUE: Multidetector CT imaging of the head and cervical spine was performed following the standard protocol without intravenous contrast. Multiplanar CT image reconstructions of the cervical spine were  also generated. COMPARISON:  CT scan of November 16, 2016. FINDINGS: CT HEAD FINDINGS Brain: Mild chronic ischemic white matter disease is noted. No mass effect or midline shift is noted. Ventricular size is within normal limits. There is no evidence of mass lesion, hemorrhage or acute infarction. Vascular: No hyperdense vessel or unexpected calcification. Skull: Normal. Negative for fracture or focal lesion. Sinuses/Orbits: Right frontal and ethmoid mucosal thickening is noted. Other: None. CT CERVICAL SPINE FINDINGS Alignment: Normal. Skull base and vertebrae: No acute fracture. No primary bone lesion or focal pathologic process. Soft tissues and spinal canal: No prevertebral fluid or swelling. No visible canal hematoma. Disc levels: Status post surgical anterior fusion of C5-6. Mild anterior osteophyte formation is also noted at C4-5 and C6-7. Upper chest: Negative. Other: None. IMPRESSION: Mild chronic ischemic white matter disease. No acute intracranial abnormality seen. Postsurgical and degenerative changes are noted in the cervical spine. No fracture or significant spondylolisthesis is noted. Electronically Signed   By: Marijo Conception M.D.   On: 07/24/2019 16:20   Ct Chest W Contrast  Result Date: 07/25/2019 CLINICAL DATA:  Abdominal lymphadenopathy, evaluate chest, history of recent falls and weakness EXAM: CT CHEST WITH CONTRAST TECHNIQUE: Multidetector CT imaging of the chest was performed during  intravenous contrast administration. CONTRAST:  61mL OMNIPAQUE IOHEXOL 300 MG/ML  SOLN COMPARISON:  CT abdomen pelvis, 07/24/2019, CT chest, 09/20/2009 FINDINGS: Cardiovascular: Aortic atherosclerosis. Normal heart size. Extensive 3 vessel coronary artery calcifications. No pericardial effusion. Mediastinum/Nodes: There is an enlarged paraesophageal lymph node measuring 2.0 x 1.4 cm at the level of T6-T7 (series 2, image 71). There are prominent periaortic and retrocrural nodes lower in the thorax (series 2, image 135, 153). There are enlarged left lower cervical/superior mediastinal lymph nodes measuring up to 1.2 x 1.0 cm (series 2, image 7). No other enlarged mediastinal, hilar, or axillary lymph nodes. Thyroid gland, trachea, and esophagus demonstrate no significant findings. Lungs/Pleura: Lungs are clear. No pleural effusion or pneumothorax. Upper Abdomen: No acute abnormality. Bulky retroperitoneal lymphadenopathy, better assessed on prior CT of the abdomen pelvis. Musculoskeletal: No chest wall mass or suspicious bone lesions identified. IMPRESSION: 1. There is an enlarged paraesophageal lymph node measuring 2.0 x 1.4 cm at the level of T6-T7 (series 2, image 71). There are prominent periaortic and retrocrural nodes lower in the thorax (series 2, image 135, 153). 2. There are enlarged left lower cervical/superior mediastinal lymph nodes measuring up to 1.2 x 1.0 cm (series 2, image 7). 3.  Coronary artery disease.  Aortic Atherosclerosis (ICD10-I70.0). Electronically Signed   By: Eddie Candle M.D.   On: 07/25/2019 14:51   Ct Cervical Spine Wo Contrast  Result Date: 07/24/2019 CLINICAL DATA:  Trauma. EXAM: CT HEAD WITHOUT CONTRAST CT CERVICAL SPINE WITHOUT CONTRAST TECHNIQUE: Multidetector CT imaging of the head and cervical spine was performed following the standard protocol without intravenous contrast. Multiplanar CT image reconstructions of the cervical spine were also generated. COMPARISON:  CT scan  of November 16, 2016. FINDINGS: CT HEAD FINDINGS Brain: Mild chronic ischemic white matter disease is noted. No mass effect or midline shift is noted. Ventricular size is within normal limits. There is no evidence of mass lesion, hemorrhage or acute infarction. Vascular: No hyperdense vessel or unexpected calcification. Skull: Normal. Negative for fracture or focal lesion. Sinuses/Orbits: Right frontal and ethmoid mucosal thickening is noted. Other: None. CT CERVICAL SPINE FINDINGS Alignment: Normal. Skull base and vertebrae: No acute fracture. No primary bone lesion or focal pathologic process. Soft tissues  and spinal canal: No prevertebral fluid or swelling. No visible canal hematoma. Disc levels: Status post surgical anterior fusion of C5-6. Mild anterior osteophyte formation is also noted at C4-5 and C6-7. Upper chest: Negative. Other: None. IMPRESSION: Mild chronic ischemic white matter disease. No acute intracranial abnormality seen. Postsurgical and degenerative changes are noted in the cervical spine. No fracture or significant spondylolisthesis is noted. Electronically Signed   By: Marijo Conception M.D.   On: 07/24/2019 16:20   Ct Abdomen Pelvis W Contrast  Result Date: 07/24/2019 CLINICAL DATA:  Bilateral leg weakness. Patient fell in the bathtub today. EXAM: CT ABDOMEN AND PELVIS WITH CONTRAST TECHNIQUE: Multidetector CT imaging of the abdomen and pelvis was performed using the standard protocol following bolus administration of intravenous contrast. CONTRAST:  47mL OMNIPAQUE IOHEXOL 300 MG/ML SOLN, 86mL OMNIPAQUE IOHEXOL 300 MG/ML SOLN COMPARISON:  06/09/2011 FINDINGS: Lower chest: Minor linear atelectasis or scarring in the anterior lung bases. Lung bases otherwise clear. Heart normal in size. Hepatobiliary: No focal liver abnormality is seen. No gallstones, gallbladder wall thickening, or biliary dilatation. Pancreas: Unremarkable. No pancreatic ductal dilatation or surrounding inflammatory changes.  Spleen: Normal in size without focal abnormality. Adrenals/Urinary Tract: No adrenal masses. Kidneys are normal in position. There is bilateral renal cortical thinning. 18 mm low-attenuation mass, posterior midpole the left kidney, consistent with a cyst. No other renal masses. Small nonobstructing stones in both kidneys. No hydronephrosis. Ureters normal in course and in caliber. No ureteral stones. Bladder moderately distended. There are dependent stones with some mild increased attenuation in the dependent bladder is well, likely debris. No wall thickening or convincing mass. Stomach/Bowel: Stomach is unremarkable. Small bowel and colon are normal in caliber. No wall thickening. No inflammation. Mild increased colonic stool burden. No evidence of appendicitis. Appendix not visualized. Vascular/Lymphatic: There is extensive adenopathy. Bulky retroperitoneal adenopathy is noted extending from level of the adrenal glands into the pelvis. Node at the level of the midpole the left kidney, left periaortic, measures 4.3 cm in short axis. Left periaortic node at the level of the bifurcation measures 4.3 cm short axis. There are bulky left pelvic lymph nodes. Posteroinferior pelvic node, to the left of the lower sigmoid colon, measures 7.4 x 4.6 cm transversely. Adjacent left external iliac chain node measures 7.2 x 3.5 cm adenopathy surrounds the common iliac veins, greater on the left, as well as the left external iliac vein. It is not defined and may be occluded. No mesenteric adenopathy. No enlarged inguinal lymph nodes. Dense aortic atherosclerosis.  No aneurysm. Reproductive: Prostate is enlarged measuring 5.9 x 5.5 x 5.6 cm. Other: No abdominal wall hernia or abnormality. No abdominopelvic ascites. Musculoskeletal: No fracture or acute finding. No osteoblastic or osteolytic lesions. IMPRESSION: 1. There is bulky retroperitoneal and left pelvic lymphadenopathy that has developed since the prior CT. Adenopathy may  occlude the left common and/or external iliac vein. Differential diagnosis includes lymphoma and other lymphoproliferative disorders. Consider the possibility of prostate carcinoma metastatic to lymph nodes. 2. No acute findings within the abdomen or pelvis. 3. Bilateral intrarenal stones. Dependent bladder stones as well as a small amount of dependent bladder debris. 4. Bilateral renal cortical thinning. 5. Aortic atherosclerosis. Electronically Signed   By: Lajean Manes M.D.   On: 07/24/2019 20:21   US Renal  Result Date: 07/25/2019 CLINICAL DATA:  Acute kidney injury. EXAM: RENAL / URINARY TRACT ULTRASOUND COMPLETE COMPARISON:  CT abdomen and pelvis 07/24/2019. FINDINGS: Right Kidney: Renal measurements: 10.4 x 5.1 x 4.9  cm = volume: 138 mL . Echogenicity within normal limits. No mass or hydronephrosis visualized. Cortex is thinned. Punctate nonobstructing stone in the lower pole seen on prior CT noted. Left Kidney: Renal measurements: 11.2 x 5.9 x 5.1 cm = volume: 180 mL. Echogenicity within normal limits. No solid mass or hydronephrosis visualized. Cortex is mildly thinned. Simple 2.1 cm cyst noted. Small nonobstructing stones are seen as on prior CT. Bladder: Bladder calculi are identified as seen on prior CT. The bladder is distended. IMPRESSION: No acute abnormality.  Negative for hydronephrosis. Bilateral nonobstructing renal stones, more numerous on the left. Distended urinary bladder. Electronically Signed   By: Inge Rise M.D.   On: 07/25/2019 14:48   Dg Abd Acute 2+v W 1v Chest  Result Date: 07/24/2019 CLINICAL DATA:  Fall with hip pain EXAM: DG ABDOMEN ACUTE W/ 1V CHEST COMPARISON:  11/16/2016 chest x-ray FINDINGS: Single-view chest demonstrate surgical hardware in the lower cervical spine. No focal opacity or pleural effusion. Normal heart size. No pneumothorax. Supine and upright views of the abdomen demonstrate no free air beneath the diaphragm. Nonobstructed gas pattern with moderate  stool. No radiopaque calculi. Phleboliths in the pelvis IMPRESSION: Negative abdominal radiographs.  No acute cardiopulmonary disease. Electronically Signed   By: Donavan Foil M.D.   On: 07/24/2019 15:37   Dg Hip Unilat W Or Wo Pelvis 2-3 Views Left  Result Date: 07/24/2019 CLINICAL DATA:  Fall with left hip pain EXAM: DG HIP (WITH OR WITHOUT PELVIS) 2-3V LEFT COMPARISON:  11/16/2016, CT 06/09/2011 FINDINGS: SI joints are non widened. Pubic symphysis and rami appear intact. No fracture or malalignment. Mild degenerative changes of the left hip with mild joint space narrowing and osteophytosis at the left femoral head neck junction. IMPRESSION: No acute osseous abnormality Electronically Signed   By: Donavan Foil M.D.   On: 07/24/2019 15:36    Labs:  CBC: Recent Labs    07/25/19 0608 07/26/19 0558 07/27/19 0552 07/29/19 0630  WBC 3.8* 3.4* 3.1* 3.5*  HGB 9.3* 9.2* 9.1* 9.6*  HCT 29.8* 29.9* 29.9* 30.9*  PLT 179 193 203 236    COAGS: Recent Labs    07/29/19 0630  INR 1.2    BMP: Recent Labs    07/25/19 0608 07/26/19 0558 07/27/19 0552 07/29/19 0630 07/29/19 1244  NA 135 139 138 139  --   K 3.8 4.5 4.6 4.3  --   CL 102 106 106 108  --   CO2 25 26 25 27   --   GLUCOSE 106* 106* 105* 137*  --   BUN 22 20 16 17   --   CALCIUM 9.9 10.6* 10.4* 10.2 10.1  CREATININE 1.52* 1.45* 1.39* 1.51*  --   GFRNONAA 44* 47* 49* 45*  --   GFRAA 51* 54* 57* 52*  --     LIVER FUNCTION TESTS: Recent Labs    07/24/19 1551 07/25/19 0608 07/26/19 0558 07/29/19 0630  BILITOT 0.6 0.5 0.5  --   AST 18 16 14*  --   ALT 16 15 14   --   ALKPHOS 78 72 64  --   PROT 7.1 6.6 6.2*  --   ALBUMIN 3.3* 3.0* 2.9* 2.7*    TUMOR MARKERS: No results for input(s): AFPTM, CEA, CA199, CHROMGRNA in the last 8760 hours.  Assessment and Plan:  General weakness x 2 mo Fall at home Retroperitoneal LAN on imaging Hypercalcemia Scheduled for RP LN Bx in Rad at [facility] today Risks and benefits of  retroperitoneal  lymph node bx was discussed with the patient and/or patient's family including, but not limited to bleeding, infection, damage to adjacent structures or low yield requiring additional tests.  All of the questions were answered and there is agreement to proceed. Consent signed and in chart.   Thank you for this interesting consult.  I greatly enjoyed meeting Daniel Richards and look forward to participating in their care.  A copy of this report was sent to the requesting provider on this date.  Electronically Signed: Lavonia Drafts, PA-C 07/30/2019, 8:05 AM   I spent a total of 20 Minutes    in face to face in clinical consultation, greater than 50% of which was counseling/coordinating care for RP LN Bx

## 2019-07-30 NOTE — Consult Note (Signed)
Consultation Note Date: 07/30/2019   Patient Name: Daniel Richards  DOB: 1946/09/21  MRN: [id number]  Age / Sex: 73 y.o., male  PCP: Daniel Court, DO Referring Physician: Orson [name], MD  Reason for Consultation: Establishing goals of care and Psychosocial/spiritual support  HPI/Patient Profile: 73 y.o. male  with past medical history of HTN/HLD, DM2, tremor, anxiety and depression, migraines, weakness, admitted on 07/24/2019 with hypercalcemia concerning for underlying malignancy, retroperitoneal and mediastinal lymphadenopathy, back pain and leg weakness.   Clinical Assessment and Goals of Care: Daniel Richards is returning from testing as I enter the room.  We move him back to his bed as he shares that he is unable to stand.  He mostly keeps his eyes closed, but will briefly make eye contact. He is alert and oriented.  There is no family at bedside at this time.   We talk about what brings him to the hospital.  He talks about his weakness and testing, but does not mention possible cancer diagnosis.  We talk about visit with oncologist, Dr. Raliegh Ip., and Daniel Richards tells me that he will follow the plan laid out by oncology, because "they know better than me".    We talk about who helps him with decision making, who would make choices if he could not.  He names his son and daughter. Nursing staff shares that daughter understands health issues, son seems to have limited health literacy.   We talk about discharge to SNF rehab and outpatient follow up with oncology.  Daniel Richards asks, "what if that doesn't work out?".   I share that the medical team will help him with his needs.  Social worker will follow him at rehab.    Mr.  Richards has no questions at this time.  We plan for a follow up tomorrow.    HCPOA    NEXT OF KIN -daughter, Daniel Richards (first call), son Daniel Richards.    SUMMARY OF RECOMMENDATIONS   Continue to treat the  treatable Open to cancer treatments if offered by oncology Accepted to [facility] for short-term rehab.  Code Status/Advance Care Planning:  DNR  Symptom Management:   Per hospitalist, no additional needs at this time.  Palliative Prophylaxis:   Frequent Pain Assessment and Turn Reposition  Additional Recommendations (Limitations, Scope, Preferences):  Continue to treat the treatable but no CPR, no intubation.  Psycho-social/Spiritual:   Desire for further Chaplaincy support:no  Additional Recommendations: Caregiving  Support/Resources and Education on Hospice  Prognosis:   Unable to determine, based on outcomes, oncology recommendations.  Discharge Planning: Accepted to [facility] for short-term rehab.      Primary Diagnoses: Present on Admission: . Hypercalcemia . Retroperitoneal lymphadenopathy . Tremor . Diabetes mellitus   I have reviewed the medical record, interviewed the patient and family, and examined the patient. The following aspects are pertinent.  Past Medical History:  Diagnosis Date  . Anxiety and depression   . Diabetes mellitus 06/09/2011   pt taking metformin  . DJD of shoulder  right   . Hyperlipidemia   . Hypertension   . Migraines   . Tremor   . Vitamin D deficiency    Social History   Socioeconomic History  . Marital status: Divorced    Spouse name: Not on file  . Number of children: 2  . Years of education: 12+  . Highest education level: Not on file  Occupational History  . Not on file  Social Needs  . Financial resource strain: Not on file  . Food insecurity    Worry: Not on file    Inability: Not on file  . Transportation needs    Medical: Not on file    Non-medical: Not on file  Tobacco Use  . Smoking status: Never Smoker  . Smokeless tobacco: Never Used  Substance and Sexual Activity  . Alcohol use: No  . Drug use: No  . Sexual activity: Not on file  Lifestyle  . Physical activity    Days per week: Not on file     Minutes per session: Not on file  . Stress: Not on file  Relationships  . Social Herbalist on phone: Not on file    Gets together: Not on file    Attends religious service: Not on file    Active member of club or organization: Not on file    Attends meetings of clubs or organizations: Not on file    Relationship status: Not on file  Other Topics Concern  . Not on file  Social History Narrative   Lives at home with son.   Caffeine use: Drinks 1 cup coffee (3 cups per week-decaf)   Family History  Problem Relation Age of Onset  . Cancer Sister   . Cancer Sister   . Cancer Brother   . Migraines Neg Hx    Scheduled Meds: . amLODipine  5 mg Oral Daily  . calcitonin  4 Units/kg Subcutaneous BID  . Chlorhexidine Gluconate Cloth  6 each Topical Daily  . citalopram  20 mg Oral QHS  . [START ON 07/31/2019] enoxaparin (LOVENOX) injection  40 mg Subcutaneous Q24H  . feeding supplement (ENSURE ENLIVE)  237 mL Oral BID BM  . folic acid  1 mg Oral Daily  . gabapentin  600 mg Oral TID  . insulin aspart  0-9 Units Subcutaneous TID WC  . multivitamin with minerals  1 tablet Oral Daily  . primidone  75 mg Oral Q8H  . propranolol  40 mg Oral BID  . tamsulosin  0.4 mg Oral Daily  . topiramate  200 mg Oral QHS  . vitamin B-12  1,000 mcg Oral Daily   Continuous Infusions: . sodium chloride     PRN Meds:.hydrALAZINE, HYDROcodone-acetaminophen, ondansetron **OR** ondansetron (ZOFRAN) IV Medications Prior to Admission:  Prior to Admission medications   Medication Sig Start Date End Date Taking? Authorizing Provider  calcitRIOL (ROCALTROL) 0.5 MCG capsule Take 1 capsule by mouth daily. 05/20/19  Yes [provider]  citalopram (CELEXA) 20 MG tablet Take 20 mg by mouth at bedtime.   Yes [provider]  gabapentin (NEURONTIN) 300 MG capsule Take 600 mg by mouth 3 (three) times daily.    Yes [provider]  INVOKANA 100 MG TABS tablet Take 1 tablet by mouth  daily. 07/17/19  Yes [provider]  lisinopril (PRINIVIL,ZESTRIL) 20 MG tablet Take 20 mg by mouth at bedtime.    Yes [provider]  metFORMIN (GLUCOPHAGE) 500 MG tablet Take 1  tablet by mouth daily. 07/02/19  Yes [provider]  primidone (MYSOLINE) 50 MG tablet Take 1.5 tablets (75 mg total) by mouth every 8 (eight) hours. Patient taking differently: Take 150 mg by mouth every 8 (eight) hours.  11/18/16  Yes Isaac Bliss, Rayford Halsted, MD  propranolol (INDERAL) 40 MG tablet Take 40 mg by mouth 2 (two) times daily.   Yes [provider]  tamsulosin (FLOMAX) 0.4 MG CAPS capsule Take 1 capsule by mouth daily. 07/02/19  Yes [provider]  topiramate (TOPAMAX) 100 MG tablet Take 200 mg by mouth at bedtime.   Yes [provider]   No Known Allergies Review of Systems  Unable to perform ROS: Other    Physical Exam Vitals signs and nursing note reviewed.     Vital Signs: BP 112/65 (BP Location: Left Arm)   Pulse 65   Temp 98.7 F (37.1 C) (Oral)   Resp 16   Ht 6' (1.829 m)   Wt 90.3 kg   SpO2 95%   BMI 27.00 kg/m  Pain Scale: 0-10 POSS *See Group Information*: S-Acceptable,Sleep, easy to arouse Pain Score: 3    SpO2: SpO2: 95 % O2 Device:SpO2: 95 % O2 Flow Rate: .   IO: Intake/output summary:   Intake/Output Summary (Last 24 hours) at 07/30/2019 1400 Last data filed at 07/30/2019 0900 Gross per 24 hour  Intake 517.53 ml  Output 2475 ml  Net -1957.47 ml    LBM: Last BM Date: 07/26/19 Baseline Weight: Weight: 90.7 kg Most recent weight: Weight: 90.3 kg     Palliative Assessment/Data:   Flowsheet Rows     Most Recent Value  Intake Tab  Referral Department  Hospitalist  Unit at Time of Referral  Med/Surg Unit  Palliative Care Primary Diagnosis  Cancer  Date Notified  07/30/19  Palliative Care Type  New Palliative care  Reason for referral  Clarify Goals of Care  Date of Admission  07/24/19  Date first seen by  Palliative Care  07/30/19  # of days Palliative referral response time  0 Day(s)  # of days IP prior to Palliative referral  6  Clinical Assessment  Palliative Performance Scale Score  50%  Pain Max last 24 hours  Not able to report  Pain Min Last 24 hours  Not able to report  Dyspnea Max Last 24 Hours  Not able to report  Dyspnea Min Last 24 hours  Not able to report  Psychosocial & Spiritual Assessment  Palliative Care Outcomes      Time In: 1400  Time Out: 1455 Time Total: 55 minutes  Greater than 50%  of this time was spent counseling and coordinating care related to the above assessment and plan.  Signed by: Drue Novel, NP   Please contact Palliative Medicine Team phone at [phone] for questions and concerns.  For individual provider: See Shea Evans

## 2019-07-30 NOTE — Procedures (Signed)
Interventional Radiology Procedure Note  Procedure: CT guided bx of left retroperitoneal lymph node.   Complications: None  Estimated Blood Loss: None  Recommendations: - Return to [facility] - Bedrest x 1 hr - Path pending  Signed,  Criselda Peaches, MD

## 2019-07-30 NOTE — Progress Notes (Signed)
Phone call from [facility] radiology post guided biopsy. Dr. Laurence Ferrari made approach through the back and took a sample from the lymph node located by the left kidney. Band-Aid placed. Patient has tolerated the procedure well per [facility] Tai RN. Will be returning to [facility] via [facility]. Prudence Davidson RN

## 2019-07-30 NOTE — TOC Progression Note (Signed)
Transition of Care [facility]) - Progression Note    Patient Details  Name: Daniel Richards MRN: [id number] Date of Birth: May 15, 1946  Transition [facility]) CM/SW Contact  Shade Flood, LCSW Phone Number: 07/30/2019, 11:06 AM  Clinical Narrative:     TOC following. Per MD, pt will likely be stable for dc tomorrow. Plan remains for pt to transition to [facility] for short term rehab. Updated Kerri at [facility]. TOC will follow up tomorrow to further assist.  Expected Discharge Plan: [place] Barriers to Discharge: Continued Medical Work up  Expected Discharge Plan and Services Expected Discharge Plan: [place] In-house Referral: Clinical Social Work   Post Acute Care Choice: [place] Living arrangements for the past 2 months: Single Family Home                                       Social Determinants of Health (SDOH) Interventions    Readmission Risk Interventions Readmission Risk Prevention Plan 07/28/2019  Medication Screening Complete  Transportation Screening Complete  Some recent data might be hidden

## 2019-07-30 NOTE — Consult Note (Signed)
[facility] Oncology Progress Note  Name: Daniel Richards      MRN: [id number]    Location: A301/A301-01  Date: 07/30/2019 Time:7:56 AM   Subjective: Interval History:Daniel Richards was seen this morning.  Denied any nausea or vomiting.  Complains of decreased appetite.  Still feeling very weak.  Last BM was 6 days ago.  Has been afebrile and denies any chills.  Complains of pain in the lower abdomen and lower back.  Received Zometa on Monday.  Objective: Vital signs in last 24 hours: Temp:  [98.6 F (37 C)-98.7 F (37.1 C)] 98.7 F (37.1 C) (09/23 0551) Pulse Rate:  [80-81] 80 (09/23 0551) Resp:  [16-18] 17 (09/23 0551) BP: (137-152)/(65-71) 152/65 (09/23 0551) SpO2:  [88 %-96 %] 94 % (09/23 0551)    Intake/Output from previous day: 09/22 0800 - 09/23 0759 In: 517.5 [P.O.:120; I.V.:397.5] Out: 2475 [Urine:2475]    Intake/Output this shift: Total I/O In: 517.5 [P.O.:120; I.V.:397.5] Out: 2475 [Urine:2475]   PHYSICAL EXAM: BP (!) 152/65 (BP Location: Right Arm)   Pulse 80   Temp 98.7 F (37.1 C) (Oral)   Resp 17   Ht 6' (1.829 m)   Wt 199 lb 1.2 oz (90.3 kg)   SpO2 94%   BMI 27.00 kg/m  General appearance: alert, cooperative and appears stated age Neck: no adenopathy, supple, symmetrical, trachea midline and thyroid not enlarged, symmetric, no tenderness/mass/nodules Lungs: clear to auscultation bilaterally Heart: regular rate and rhythm Abdomen: soft, non-tender; bowel sounds normal; no masses,  no organomegaly Extremities: extremities normal, atraumatic, no cyanosis or edema Skin: Skin color, texture, turgor normal. No rashes or lesions Lymph nodes: Cervical, supraclavicular, and axillary nodes normal. Neurologic: Grossly normal   Studies/Results: Results for orders placed or performed during the hospital encounter of 07/24/19 (from the past 48 hour(s))  Glucose, capillary     Status: Abnormal   Collection Time: 07/28/19  8:49 AM  Result Value Ref Range    Glucose-Capillary 121 (H) 70 - 99 mg/dL  Glucose, capillary     Status: Abnormal   Collection Time: 07/28/19 11:38 AM  Result Value Ref Range   Glucose-Capillary 110 (H) 70 - 99 mg/dL  Glucose, capillary     Status: Abnormal   Collection Time: 07/28/19  4:40 PM  Result Value Ref Range   Glucose-Capillary 135 (H) 70 - 99 mg/dL  Glucose, capillary     Status: Abnormal   Collection Time: 07/28/19  8:45 PM  Result Value Ref Range   Glucose-Capillary 163 (H) 70 - 99 mg/dL  Basic metabolic panel     Status: Abnormal   Collection Time: 07/29/19  6:30 AM  Result Value Ref Range   Sodium 139 135 - 145 mmol/L   Potassium 4.3 3.5 - 5.1 mmol/L   Chloride 108 98 - 111 mmol/L   CO2 27 22 - 32 mmol/L   Glucose, Bld 137 (H) 70 - 99 mg/dL   BUN 17 8 - 23 mg/dL   Creatinine, Ser 1.51 (H) 0.61 - 1.24 mg/dL   Calcium 10.2 8.9 - 10.3 mg/dL   GFR calc non Af Amer 45 (L) >60 mL/min   GFR calc Af Amer 52 (L) >60 mL/min   Anion gap 4 (L) 5 - 15    Comment: Performed at [facility], [address], [place], [place] [address]  CBC     Status: Abnormal   Collection Time: 07/29/19  6:30 AM  Result Value Ref Range   WBC 3.5 (L)  4.0 - 10.5 K/uL   RBC 2.97 (L) 4.22 - 5.81 MIL/uL   Hemoglobin 9.6 (L) 13.0 - 17.0 g/dL   HCT 30.9 (L) 39.0 - 52.0 %   MCV 104.0 (H) 80.0 - 100.0 fL   MCH 32.3 26.0 - 34.0 pg   MCHC 31.1 30.0 - 36.0 g/dL   RDW 15.9 (H) 11.5 - 15.5 %   Platelets 236 150 - 400 K/uL   nRBC 0.0 0.0 - 0.2 %    Comment: Performed at [facility], [address], [place], [place] [address]  Albumin     Status: Abnormal   Collection Time: 07/29/19  6:30 AM  Result Value Ref Range   Albumin 2.7 (L) 3.5 - 5.0 g/dL    Comment: Performed at [facility], [address] [place], [place] [address]  Protime-INR     Status: None   Collection Time: 07/29/19  6:30 AM  Result Value Ref Range   Prothrombin Time 15.1 11.4 - 15.2 seconds   INR 1.2 0.8 - 1.2    Comment: (NOTE) INR goal varies based on  device and disease states. Performed at [facility], [address] [place], [place] [address]   Glucose, capillary     Status: Abnormal   Collection Time: 07/29/19  8:05 AM  Result Value Ref Range   Glucose-Capillary 123 (H) 70 - 99 mg/dL  Glucose, capillary     Status: Abnormal   Collection Time: 07/29/19 11:02 AM  Result Value Ref Range   Glucose-Capillary 126 (H) 70 - 99 mg/dL  PTH, intact and calcium     Status: Abnormal   Collection Time: 07/29/19 12:44 PM  Result Value Ref Range   PTH 14 (L) 15 - 65 pg/mL   Calcium, Total (PTH) 10.1 8.6 - 10.2 mg/dL   PTH Interp Comment     Comment: (NOTE) Interpretation                 Intact PTH    Calcium                                (pg/mL)      (mg/dL) Normal                          15 - 65     8.6 - 10.2 Primary Hyperparathyroidism         >65          >10.2 Secondary Hyperparathyroidism       >65          <10.2 Non-Parathyroid Hypercalcemia       <65          >10.2 Hypoparathyroidism                  <15          < 8.6 Non-Parathyroid Hypocalcemia    15 - 65          < 8.6 Performed At: [facility] [place], [place] [id number] Rush Farmer MD [id number]   Glucose, capillary     Status: Abnormal   Collection Time: 07/29/19  4:07 PM  Result Value Ref Range   Glucose-Capillary 130 (H) 70 - 99 mg/dL  Glucose, capillary     Status: Abnormal   Collection Time: 07/29/19  9:05 PM  Result Value Ref Range   Glucose-Capillary 105 (H) 70 -  99 mg/dL  Glucose, capillary     Status: Abnormal   Collection Time: 07/30/19  7:46 AM  Result Value Ref Range   Glucose-Capillary 128 (H) 70 - 99 mg/dL   No results found.   MEDICATIONS: I have reviewed the patient's current medications.     Assessment/Plan:  1.  Generalized lymphadenopathy: - 88-month history of progressive weakness.  No significant weight loss, fevers or night sweats. - CT AP on 07/24/2019 shows bulky retroperitoneal adenopathy extending from  the level of the adrenal glands into the pelvis.  Lymph node at the level of the mid pole of the left kidney,measures 4.3 cm.  Left pelvic adenopathy measures 7.4 x 4.6 cm.  Left external iliac lymph node measures 7.2 x 3.5 cm.  No mesenteric adenopathy.  No enlarged inguinal lymph nodes. - CT chest shows enlarged paraesophageal lymph node measuring 2 x 1.4 cm.  Prominent periaortic and retrocrural lymph nodes.  Enlarged left lower cervical/superior mediastinal nodes measuring 1.2 x 1.0 cm.  LDH is within normal limits. -He is scheduled for CT-guided biopsy today. -We will follow-up on the results.  He will need PET scan as an outpatient.  2.  Hypercalcemia: -Patient presented with calcium level of 10.6 with albumin of 3. -He received Zometa on 07/28/2019. -Calcium improved to 10.1 yesterday.  3.  123456 and folic acid deficiency: - He has macrocytic anemia and some pancytopenia. -He is receiving vitamin 123456 tablet and folic acid tablet daily.  All questions were answered. The patient knows to call the clinic with any problems, questions or concerns. We can certainly see the patient much sooner if necessary.     Derek Jack

## 2019-07-30 NOTE — Care Management Important Message (Signed)
Important Message  Patient Details  Name: Daniel Richards MRN: [id number] Date of Birth: May 28, 1946   Medicare Important Message Given:  Yes     [name] 07/30/2019, 12:06 PM

## 2019-07-30 NOTE — Progress Notes (Signed)
PT Cancellation Note  Patient Details Name: Daniel Richards MRN: [id number] DOB: Oct 26, 1946   Cancelled Treatment:    Reason Eval/Treat Not Completed: Fatigue/lethargy limiting ability to participate. Pt just returned from biopsy at [facility] per family member at bedside and is resting. Will hold therapy until tomorrow.    Tori Ashley Bultema PT, DPT 07/30/19, 2:08 PM [phone]

## 2019-07-30 NOTE — Progress Notes (Addendum)
PROGRESS NOTE  Daniel Richards [id number] DOB: 07-22-1946 DOA: 07/24/2019 PCP: Scotty Court, DO  Brief History:  73 year old male with a history of hypertension, hyperlipidemia, diabetes mellitus type 2, essential tremor presenting with frequent falls, lower abdominal pain, generalized weakness and back pain. He complained of weakness which is generalized and slightly more weakness in the legs for the past 1 to 2 months.  The patient was found to have a corrected calcium of 11.36 at the time of admission. Patient lives with his son at home.  He was found to have elevated calcium of 10.6.  A CT of the abdomen and pelvis with contrast on 07/24/2019 showed bulky retroperitoneal and left pelvic lymphadenopathy which is new since CT scan from 2012.  Prostate was also enlarged.  An LDH level was normal at 107.  He denied any fevers, night sweats or significant weight loss in the last 6 months.   A CT scan of the chest done on 07/25/2019 showed enlarged paraesophageal lymph node measuring 2 x 1.4 cm at the level of T6-T7.  Prominent periaortic and retrocrural lymph nodes.  Enlarged left lower cervical/superior mediastinal lymph nodes measuring 1.2 x 1.0 cm.  Patient is an ex-smoker, quit at age 80.  He smoked up to 3 packs/day for 20 years.  Assessment/Plan: Hypercalcemia -concerned about underlying malignancy -iPTH = 14 -rPTH--pending -25 vitamin D -1,25 vitamin D -received zometa x 1 on 07/28/19 -finishing 48 hours of miacalcin 07/30/19 -increase IVF  -9/23 corrected calcium 11.26  Retroperitoneal and mediastinal lymphadenopathy -07/30/2019--percutaneous biopsy retroperitoneal lymph nodes by interventional radiology -Appreciate medical oncology consult--> planning for outpatient PET scan  Back pain and leg weakness -MRI thoracic and lumbar spine -May be partly due to 123456 and folic acid deficiency -Serum 123456 0000000 -Serum folic acid 5.0 -Supplementing both 123456 and folic acid  Acute  on chronic renal failure--CKD stage III -Baseline creatinine 1.0-1.2 -Presented with serum creatinine 1.64 -Secondary to volume depletion -Increase IV fluids -am CMP  Diabetes mellitus type 2, controlled -07/24/19 hemoglobin A1c 5.6 -Discontinue sliding scale insulin -Allow for liberal glycemic control at this point  Urinary retention -Foley catheter was placed at the time of admission -Discontinue Foley catheter for voiding trial -Continue Flomax 0.4 mg daily  Essential hypertension -Continue amlodipine -Continue Inderal which is also being used for his essential tremor  Essential tremor -Continue Mysoline and Inderal         Disposition Plan:   SNF pending MRI results Family Communication:   Son updated at bedside 9/23  Consultants:  Med onc, palliative  Code Status:  FULL   DVT Prophylaxis:   Winnie Lovenox   Procedures: As Listed in Progress Note Above  Antibiotics: None       Subjective: Pt complains of low back pain about the same as at home.  Denies f/c, cp, sob, n/v/d, dysuria.  Has lower abd pain  Objective: Vitals:   07/29/19 1951 07/29/19 2105 07/30/19 0551 07/30/19 1205  BP:  (!) 145/71 (!) 152/65 112/65  Pulse:  81 80 65  Resp:  16 17 16   Temp:  98.6 F (37 C) 98.7 F (37.1 C)   TempSrc:  Oral Oral   SpO2: 91% 96% 94% 95%  Weight:      Height:        Intake/Output Summary (Last 24 hours) at 07/30/2019 1320 Last data filed at 07/30/2019 0900 Gross per 24 hour  Intake 517.53 ml  Output  2475 ml  Net -1957.47 ml   Weight change:  Exam:   General:  Pt is alert, follows commands appropriately, not in acute distress  HEENT: No icterus, No thrush, No neck mass, [place]/AT  Cardiovascular: RRR, S1/S2, no rubs, no gallops  Respiratory: bibasilar crackles, no wheeze  Abdomen: Soft/+BS, non tender, non distended, no guarding  Extremities: No edema, No lymphangitis, No petechiae, No rashes, no synovitis   Data Reviewed: I have  personally reviewed following labs and imaging studies Basic Metabolic Panel: Recent Labs  Lab 07/24/19 1551 07/25/19 0608 07/26/19 0558 07/27/19 0552 07/29/19 0630 07/29/19 1244  NA 138 135 139 138 139  --   K 3.9 3.8 4.5 4.6 4.3  --   CL 102 102 106 106 108  --   CO2 27 25 26 25 27   --   GLUCOSE 113* 106* 106* 105* 137*  --   BUN 24* 22 20 16 17   --   CREATININE 1.64* 1.52* 1.45* 1.39* 1.51*  --   CALCIUM 10.8* 9.9 10.6* 10.4* 10.2 10.1  MG  --   --  2.0 2.0  --   --    Liver Function Tests: Recent Labs  Lab 07/24/19 1551 07/25/19 0608 07/26/19 0558 07/29/19 0630  AST 18 16 14*  --   ALT 16 15 14   --   ALKPHOS 78 72 64  --   BILITOT 0.6 0.5 0.5  --   PROT 7.1 6.6 6.2*  --   ALBUMIN 3.3* 3.0* 2.9* 2.7*   Recent Labs  Lab 07/24/19 1551  LIPASE 21   No results for input(s): AMMONIA in the last 168 hours. Coagulation Profile: Recent Labs  Lab 07/29/19 0630  INR 1.2   CBC: Recent Labs  Lab 07/24/19 1551 07/25/19 0608 07/26/19 0558 07/27/19 0552 07/29/19 0630  WBC 4.0 3.8* 3.4* 3.1* 3.5*  NEUTROABS 1.7  --   --   --   --   HGB 10.1* 9.3* 9.2* 9.1* 9.6*  HCT 32.2* 29.8* 29.9* 29.9* 30.9*  MCV 104.9* 103.8* 105.7* 106.0* 104.0*  PLT 201 179 193 203 236   Cardiac Enzymes: Recent Labs  Lab 07/24/19 1551  CKTOTAL 34*   BNP: Invalid input(s): POCBNP CBG: Recent Labs  Lab 07/29/19 1102 07/29/19 1607 07/29/19 2105 07/30/19 0746 07/30/19 1228  GLUCAP 126* 130* 105* 128* 129*   HbA1C: No results for input(s): HGBA1C in the last 72 hours. Urine analysis:    Component Value Date/Time   COLORURINE YELLOW 07/24/2019 1841   APPEARANCEUR CLEAR 07/24/2019 1841   LABSPEC 1.025 07/24/2019 1841   PHURINE 6.0 07/24/2019 1841   GLUCOSEU NEGATIVE 07/24/2019 1841   HGBUR TRACE (A) 07/24/2019 1841   BILIRUBINUR SMALL (A) 07/24/2019 1841   KETONESUR NEGATIVE 07/24/2019 1841   PROTEINUR TRACE (A) 07/24/2019 1841   UROBILINOGEN 1.0 11/18/2014 1238    NITRITE NEGATIVE 07/24/2019 1841   LEUKOCYTESUR NEGATIVE 07/24/2019 1841   Sepsis Labs: @LABRCNTIP (procalcitonin:4,lacticidven:4) ) Recent Results (from the past 240 hour(s))  SARS CORONAVIRUS 2 ([place] 6-24 HRS) Nasopharyngeal Nasopharyngeal Swab     Status: None   Collection Time: 07/24/19  9:53 PM   Specimen: Nasopharyngeal Swab  Result Value Ref Range Status   SARS Coronavirus 2 NEGATIVE NEGATIVE Final    Comment: (NOTE) SARS-CoV-2 target nucleic acids are NOT DETECTED. The SARS-CoV-2 RNA is generally detectable in upper and lower respiratory specimens during the acute phase of infection. Negative results do not preclude SARS-CoV-2 infection, do not rule out co-infections with other  pathogens, and should not be used as the sole basis for treatment or other patient management decisions. Negative results must be combined with clinical observations, patient history, and epidemiological information. The expected result is Negative. Fact Sheet for Patients: SugarRoll.be Fact Sheet for Healthcare Providers: https://www.woods-mathews.com/ This test is not yet approved or cleared by the [place] FDA and  has been authorized for detection and/or diagnosis of SARS-CoV-2 by FDA under an Emergency Use Authorization (EUA). This EUA will remain  in effect (meaning this test can be used) for the duration of the COVID-19 declaration under Section 56 4(b)(1) of the Act, 21 U.S.C. section 360bbb-3(b)(1), unless the authorization is terminated or revoked sooner. Performed at [facility], [place] [address] [place], [place] [address]      Scheduled Meds:  amLODipine  5 mg Oral Daily   calcitonin  4 Units/kg Subcutaneous BID   Chlorhexidine Gluconate Cloth  6 each Topical Daily   citalopram  20 mg Oral QHS   [START ON 07/31/2019] enoxaparin (LOVENOX) injection  40 mg Subcutaneous Q24H   feeding supplement (ENSURE ENLIVE)  237 mL Oral BID  BM   folic acid  1 mg Oral Daily   gabapentin  600 mg Oral TID   insulin aspart  0-9 Units Subcutaneous TID WC   multivitamin with minerals  1 tablet Oral Daily   primidone  75 mg Oral Q8H   propranolol  40 mg Oral BID   tamsulosin  0.4 mg Oral Daily   topiramate  200 mg Oral QHS   vitamin B-12  1,000 mcg Oral Daily   Continuous Infusions:  sodium chloride 50 mL/hr at 07/30/19 1235    Procedures/Studies: Ct Head Wo Contrast  Result Date: 07/24/2019 CLINICAL DATA:  Trauma. EXAM: CT HEAD WITHOUT CONTRAST CT CERVICAL SPINE WITHOUT CONTRAST TECHNIQUE: Multidetector CT imaging of the head and cervical spine was performed following the standard protocol without intravenous contrast. Multiplanar CT image reconstructions of the cervical spine were also generated. COMPARISON:  CT scan of November 16, 2016. FINDINGS: CT HEAD FINDINGS Brain: Mild chronic ischemic white matter disease is noted. No mass effect or midline shift is noted. Ventricular size is within normal limits. There is no evidence of mass lesion, hemorrhage or acute infarction. Vascular: No hyperdense vessel or unexpected calcification. Skull: Normal. Negative for fracture or focal lesion. Sinuses/Orbits: Right frontal and ethmoid mucosal thickening is noted. Other: None. CT CERVICAL SPINE FINDINGS Alignment: Normal. Skull base and vertebrae: No acute fracture. No primary bone lesion or focal pathologic process. Soft tissues and spinal canal: No prevertebral fluid or swelling. No visible canal hematoma. Disc levels: Status post surgical anterior fusion of C5-6. Mild anterior osteophyte formation is also noted at C4-5 and C6-7. Upper chest: Negative. Other: None. IMPRESSION: Mild chronic ischemic white matter disease. No acute intracranial abnormality seen. Postsurgical and degenerative changes are noted in the cervical spine. No fracture or significant spondylolisthesis is noted. Electronically Signed   By: Marijo Conception M.D.   On:  07/24/2019 16:20   Ct Chest W Contrast  Result Date: 07/25/2019 CLINICAL DATA:  Abdominal lymphadenopathy, evaluate chest, history of recent falls and weakness EXAM: CT CHEST WITH CONTRAST TECHNIQUE: Multidetector CT imaging of the chest was performed during intravenous contrast administration. CONTRAST:  60mL OMNIPAQUE IOHEXOL 300 MG/ML  SOLN COMPARISON:  CT abdomen pelvis, 07/24/2019, CT chest, 09/20/2009 FINDINGS: Cardiovascular: Aortic atherosclerosis. Normal heart size. Extensive 3 vessel coronary artery calcifications. No pericardial effusion. Mediastinum/Nodes: There is an enlarged paraesophageal  lymph node measuring 2.0 x 1.4 cm at the level of T6-T7 (series 2, image 71). There are prominent periaortic and retrocrural nodes lower in the thorax (series 2, image 135, 153). There are enlarged left lower cervical/superior mediastinal lymph nodes measuring up to 1.2 x 1.0 cm (series 2, image 7). No other enlarged mediastinal, hilar, or axillary lymph nodes. Thyroid gland, trachea, and esophagus demonstrate no significant findings. Lungs/Pleura: Lungs are clear. No pleural effusion or pneumothorax. Upper Abdomen: No acute abnormality. Bulky retroperitoneal lymphadenopathy, better assessed on prior CT of the abdomen pelvis. Musculoskeletal: No chest wall mass or suspicious bone lesions identified. IMPRESSION: 1. There is an enlarged paraesophageal lymph node measuring 2.0 x 1.4 cm at the level of T6-T7 (series 2, image 71). There are prominent periaortic and retrocrural nodes lower in the thorax (series 2, image 135, 153). 2. There are enlarged left lower cervical/superior mediastinal lymph nodes measuring up to 1.2 x 1.0 cm (series 2, image 7). 3.  Coronary artery disease.  Aortic Atherosclerosis (ICD10-I70.0). Electronically Signed   By: Eddie Candle M.D.   On: 07/25/2019 14:51   Ct Cervical Spine Wo Contrast  Result Date: 07/24/2019 CLINICAL DATA:  Trauma. EXAM: CT HEAD WITHOUT CONTRAST CT CERVICAL SPINE  WITHOUT CONTRAST TECHNIQUE: Multidetector CT imaging of the head and cervical spine was performed following the standard protocol without intravenous contrast. Multiplanar CT image reconstructions of the cervical spine were also generated. COMPARISON:  CT scan of November 16, 2016. FINDINGS: CT HEAD FINDINGS Brain: Mild chronic ischemic white matter disease is noted. No mass effect or midline shift is noted. Ventricular size is within normal limits. There is no evidence of mass lesion, hemorrhage or acute infarction. Vascular: No hyperdense vessel or unexpected calcification. Skull: Normal. Negative for fracture or focal lesion. Sinuses/Orbits: Right frontal and ethmoid mucosal thickening is noted. Other: None. CT CERVICAL SPINE FINDINGS Alignment: Normal. Skull base and vertebrae: No acute fracture. No primary bone lesion or focal pathologic process. Soft tissues and spinal canal: No prevertebral fluid or swelling. No visible canal hematoma. Disc levels: Status post surgical anterior fusion of C5-6. Mild anterior osteophyte formation is also noted at C4-5 and C6-7. Upper chest: Negative. Other: None. IMPRESSION: Mild chronic ischemic white matter disease. No acute intracranial abnormality seen. Postsurgical and degenerative changes are noted in the cervical spine. No fracture or significant spondylolisthesis is noted. Electronically Signed   By: Marijo Conception M.D.   On: 07/24/2019 16:20   Ct Abdomen Pelvis W Contrast  Result Date: 07/24/2019 CLINICAL DATA:  Bilateral leg weakness. Patient fell in the bathtub today. EXAM: CT ABDOMEN AND PELVIS WITH CONTRAST TECHNIQUE: Multidetector CT imaging of the abdomen and pelvis was performed using the standard protocol following bolus administration of intravenous contrast. CONTRAST:  13mL OMNIPAQUE IOHEXOL 300 MG/ML SOLN, 72mL OMNIPAQUE IOHEXOL 300 MG/ML SOLN COMPARISON:  06/09/2011 FINDINGS: Lower chest: Minor linear atelectasis or scarring in the anterior lung bases.  Lung bases otherwise clear. Heart normal in size. Hepatobiliary: No focal liver abnormality is seen. No gallstones, gallbladder wall thickening, or biliary dilatation. Pancreas: Unremarkable. No pancreatic ductal dilatation or surrounding inflammatory changes. Spleen: Normal in size without focal abnormality. Adrenals/Urinary Tract: No adrenal masses. Kidneys are normal in position. There is bilateral renal cortical thinning. 18 mm low-attenuation mass, posterior midpole the left kidney, consistent with a cyst. No other renal masses. Small nonobstructing stones in both kidneys. No hydronephrosis. Ureters normal in course and in caliber. No ureteral stones. Bladder moderately distended. There are dependent  stones with some mild increased attenuation in the dependent bladder is well, likely debris. No wall thickening or convincing mass. Stomach/Bowel: Stomach is unremarkable. Small bowel and colon are normal in caliber. No wall thickening. No inflammation. Mild increased colonic stool burden. No evidence of appendicitis. Appendix not visualized. Vascular/Lymphatic: There is extensive adenopathy. Bulky retroperitoneal adenopathy is noted extending from level of the adrenal glands into the pelvis. Node at the level of the midpole the left kidney, left periaortic, measures 4.3 cm in short axis. Left periaortic node at the level of the bifurcation measures 4.3 cm short axis. There are bulky left pelvic lymph nodes. Posteroinferior pelvic node, to the left of the lower sigmoid colon, measures 7.4 x 4.6 cm transversely. Adjacent left external iliac chain node measures 7.2 x 3.5 cm adenopathy surrounds the common iliac veins, greater on the left, as well as the left external iliac vein. It is not defined and may be occluded. No mesenteric adenopathy. No enlarged inguinal lymph nodes. Dense aortic atherosclerosis.  No aneurysm. Reproductive: Prostate is enlarged measuring 5.9 x 5.5 x 5.6 cm. Other: No abdominal wall hernia or  abnormality. No abdominopelvic ascites. Musculoskeletal: No fracture or acute finding. No osteoblastic or osteolytic lesions. IMPRESSION: 1. There is bulky retroperitoneal and left pelvic lymphadenopathy that has developed since the prior CT. Adenopathy may occlude the left common and/or external iliac vein. Differential diagnosis includes lymphoma and other lymphoproliferative disorders. Consider the possibility of prostate carcinoma metastatic to lymph nodes. 2. No acute findings within the abdomen or pelvis. 3. Bilateral intrarenal stones. Dependent bladder stones as well as a small amount of dependent bladder debris. 4. Bilateral renal cortical thinning. 5. Aortic atherosclerosis. Electronically Signed   By: Lajean Manes M.D.   On: 07/24/2019 20:21   US Renal  Result Date: 07/25/2019 CLINICAL DATA:  Acute kidney injury. EXAM: RENAL / URINARY TRACT ULTRASOUND COMPLETE COMPARISON:  CT abdomen and pelvis 07/24/2019. FINDINGS: Right Kidney: Renal measurements: 10.4 x 5.1 x 4.9 cm = volume: 138 mL . Echogenicity within normal limits. No mass or hydronephrosis visualized. Cortex is thinned. Punctate nonobstructing stone in the lower pole seen on prior CT noted. Left Kidney: Renal measurements: 11.2 x 5.9 x 5.1 cm = volume: 180 mL. Echogenicity within normal limits. No solid mass or hydronephrosis visualized. Cortex is mildly thinned. Simple 2.1 cm cyst noted. Small nonobstructing stones are seen as on prior CT. Bladder: Bladder calculi are identified as seen on prior CT. The bladder is distended. IMPRESSION: No acute abnormality.  Negative for hydronephrosis. Bilateral nonobstructing renal stones, more numerous on the left. Distended urinary bladder. Electronically Signed   By: Inge Rise M.D.   On: 07/25/2019 14:48   Dg Abd Acute 2+v W 1v Chest  Result Date: 07/24/2019 CLINICAL DATA:  Fall with hip pain EXAM: DG ABDOMEN ACUTE W/ 1V CHEST COMPARISON:  11/16/2016 chest x-ray FINDINGS: Single-view chest  demonstrate surgical hardware in the lower cervical spine. No focal opacity or pleural effusion. Normal heart size. No pneumothorax. Supine and upright views of the abdomen demonstrate no free air beneath the diaphragm. Nonobstructed gas pattern with moderate stool. No radiopaque calculi. Phleboliths in the pelvis IMPRESSION: Negative abdominal radiographs.  No acute cardiopulmonary disease. Electronically Signed   By: Donavan Foil M.D.   On: 07/24/2019 15:37   Dg Hip Unilat W Or Wo Pelvis 2-3 Views Left  Result Date: 07/24/2019 CLINICAL DATA:  Fall with left hip pain EXAM: DG HIP (WITH OR WITHOUT PELVIS) 2-3V LEFT COMPARISON:  11/16/2016, CT 06/09/2011 FINDINGS: SI joints are non widened. Pubic symphysis and rami appear intact. No fracture or malalignment. Mild degenerative changes of the left hip with mild joint space narrowing and osteophytosis at the left femoral head neck junction. IMPRESSION: No acute osseous abnormality Electronically Signed   By: Donavan Foil M.D.   On: 07/24/2019 15:36    Orson [name], DO  [facility] Pager [phone]  If 7PM-7AM, please contact night-coverage [email or web address] Password [email or web address] 07/30/2019, 1:20 PM   LOS: 5 days

## 2019-07-30 NOTE — Progress Notes (Signed)
Phone call from [place], spoke with Laban Emperor EMT. Verbal report given.  [name] RN @0730 

## 2019-07-31 ENCOUNTER — Other Ambulatory Visit: Payer: Self-pay

## 2019-07-31 DIAGNOSIS — N179 Acute kidney failure, unspecified: Secondary | ICD-10-CM

## 2019-07-31 DIAGNOSIS — R1310 Dysphagia, unspecified: Secondary | ICD-10-CM

## 2019-07-31 LAB — GLUCOSE, CAPILLARY
Glucose-Capillary: 80 mg/dL (ref 70–99)
Glucose-Capillary: 120 mg/dL — ABNORMAL HIGH (ref 70–99)
Glucose-Capillary: 123 mg/dL — ABNORMAL HIGH (ref 70–99)
Glucose-Capillary: 131 mg/dL — ABNORMAL HIGH (ref 70–99)

## 2019-07-31 LAB — COMPREHENSIVE METABOLIC PANEL
ALT: 38 U/L (ref 0–44)
AST: 34 U/L (ref 15–41)
Albumin: 2.6 g/dL — ABNORMAL LOW (ref 3.5–5.0)
Alkaline Phosphatase: 57 U/L (ref 38–126)
Anion gap: 6 (ref 5–15)
BUN: 23 mg/dL (ref 8–23)
CO2: 22 mmol/L (ref 22–32)
Calcium: 9.2 mg/dL (ref 8.9–10.3)
Chloride: 110 mmol/L (ref 98–111)
Creatinine, Ser: 1.36 mg/dL — ABNORMAL HIGH (ref 0.61–1.24)
GFR calc Af Amer: 59 mL/min — ABNORMAL LOW (ref >60)
GFR calc non Af Amer: 51 mL/min — ABNORMAL LOW (ref >60)
Glucose, Bld: 88 mg/dL (ref 70–99)
Potassium: 4.1 mmol/L (ref 3.5–5.1)
Sodium: 138 mmol/L (ref 135–145)
Total Bilirubin: 0.3 mg/dL (ref 0.3–1.2)
Total Protein: 6.1 g/dL — ABNORMAL LOW (ref 6.5–8.1)

## 2019-07-31 MED ORDER — SUCRALFATE 1 GM/10ML PO SUSP
1.0000 g | Freq: Three times a day (TID) | ORAL | Status: DC
  Administered 2019-07-31 – 2019-08-01 (×4): 1 g via ORAL
  Filled 2019-07-31 (×5): qty 10

## 2019-07-31 MED ORDER — MILK AND MOLASSES ENEMA: 1.0000 | Freq: Once | RECTAL | Status: DC

## 2019-07-31 MED ORDER — BISACODYL 10 MG RE SUPP
10.0000 mg | Freq: Every day | RECTAL | Status: DC | PRN
  Administered 2019-07-31: 10 mg via RECTAL
  Filled 2019-07-31: qty 1

## 2019-07-31 MED ORDER — PANTOPRAZOLE SODIUM 40 MG PO TBEC
40.0000 mg | DELAYED_RELEASE_TABLET | Freq: Two times a day (BID) | ORAL | Status: DC
  Administered 2019-07-31 – 2019-08-01 (×3): 40 mg via ORAL
  Filled 2019-07-31 (×3): qty 1

## 2019-07-31 NOTE — Progress Notes (Addendum)
PROGRESS NOTE  Daniel Richards [id number] DOB: 03-25-1946 DOA: 07/24/2019 PCP: Scotty Court, DO Brief History:  73 year old male with a history of hypertension, hyperlipidemia, diabetes mellitus type 2, essential tremor presenting with frequent falls, lower abdominal pain, generalized weakness and back pain. He complained of weakness which is generalized and slightly more weakness in the legs for the past 1 to 2 months.  The patient was found to have a corrected calcium of 11.36 at the time of admission. Patient lives with his son at home. He was found to have elevated calcium of 10.6. A CT of the abdomen and pelvis with contrast on 07/24/2019 showed bulky retroperitoneal and left pelvic lymphadenopathy which is new since CT scan from 2012. Prostate was also enlarged. An LDH level was normal at 107. He denied any fevers, night sweats or significant weight loss in the last 6 months.  A CT scan of the chest done on 07/25/2019 showed enlarged paraesophageal lymph node measuring 2 x 1.4 cm at the level of T6-T7. Prominent periaortic and retrocrural lymph nodes. Enlarged left lower cervical/superior mediastinal lymph nodes measuring 1.2 x 1.0 cm.  Patient is an ex-smoker, quit at age 75. He smoked up to 3 packs/day for 20 years.  Assessment/Plan: Hypercalcemia -concerned about underlying malignancy -iPTH = 14 -rPTH--pending -25 vitamin D -1,25 vitamin D -received zometa x 1 on 07/28/19 -finished 48 hours of miacalcin 07/30/19 -increase IVF  -9/24 corrected calcium 11.26>>10.32  Retroperitoneal and mediastinal lymphadenopathy -07/30/2019--percutaneous biopsy retroperitoneal lymph nodes by interventional radiology-pathology pending -Appreciate medical oncology consult--> planning for outpatient PET scan  Odynophagia -appreciate GI consult-->protonix bid with carafate empirically  Back pain and leg weakness -MRI thoracic and lumbar spine--no metastasis, impingement,  myelopathy -May be partly due to 123456 and folic acid deficiency -Serum 123456 0000000 -Serum folic acid 5.0 -Supplementing both 123456 and folic acid  Acute on chronic renal failure--CKD stage III -Baseline creatinine 1.0-1.2 -Presented with serum creatinine 1.64 -Secondary to volume depletion -Increased IV fluids -am CMP  Diabetes mellitus type 2, controlled -07/24/19 hemoglobin A1c 5.6 -Discontinue sliding scale insulin -Allow for liberal glycemic control at this point  Urinary retention -Foley catheter was placed at the time of admission -Discontinue Foley catheter for voiding trial 9/23 -unable to void on own-->replace foley 9/24 -Continue Flomax 0.4 mg daily  Essential hypertension -Continue amlodipine -Continue Inderal which is also being used for his essential tremor  Essential tremor -Continue Mysoline and Inderal   Goals of Care Advance care planning, including the explanation and discussion of advance directives was carried out with the patient and family.  Code status including explanations of "Full Code" and "DNR" and alternatives were discussed in detail.  Discussion of end-of-life issues including but not limited palliative care, hospice care and the concept of hospice, other end-of-life care options, power of attorney for health care decisions, living wills, and physician orders for life-sustaining treatment were also discussed with the patient and family.  Total face to face time 16 minutes. -pt is DNR      Disposition Plan:   SNF 1-2 days pending GI work up Family Communication:   Son updated at bedside 9/24  Consultants:  Med onc, palliative  Code Status:  FULL   DVT Prophylaxis:   [place] Lovenox   Procedures: As Listed in Progress Note Above  Antibiotics: None    Total time spent 35 minutes.  Greater than 50% spent face to face counseling and coordinating care.  Subjective: Pt complains of odynophagia with liquids and solids.  Poor  apetite and po intake.  Denies f/c, cp, sob, n/v/d, abd pain  Objective: Vitals:   07/30/19 2102 07/31/19 0508 07/31/19 1123 07/31/19 1500  BP: (!) 126/54 (!) 103/53 135/70 131/60  Pulse: 73 79 81 64  Resp: 20 18 18 16   Temp: 98.4 F (36.9 C) 98 F (36.7 C) 98.2 F (36.8 C) 98 F (36.7 C)  TempSrc: Oral Oral Oral Oral  SpO2: 94% 95% 97% 99%  Weight:      Height:        Intake/Output Summary (Last 24 hours) at 07/31/2019 1729 Last data filed at 07/31/2019 1643 Gross per 24 hour  Intake 2229.03 ml  Output 650 ml  Net 1579.03 ml   Weight change:  Exam:   General:  Pt is alert, follows commands appropriately, not in acute distress  HEENT: No icterus, No thrush, No neck mass, [place]/AT  Cardiovascular: RRR, S1/S2, no rubs, no gallops  Respiratory: bibasilar crackles, no wheeze  Abdomen: Soft/+BS, lower tender, non distended, no guarding  Extremities: no edema, No lymphangitis, No petechiae, No rashes, no synovitis   Data Reviewed: I have personally reviewed following labs and imaging studies Basic Metabolic Panel: Recent Labs  Lab 07/25/19 0608 07/26/19 0558 07/27/19 0552 07/29/19 0630 07/29/19 1244 07/31/19 0454  NA 135 139 138 139  --  138  K 3.8 4.5 4.6 4.3  --  4.1  CL 102 106 106 108  --  110  CO2 25 26 25 27   --  22  GLUCOSE 106* 106* 105* 137*  --  88  BUN 22 20 16 17   --  23  CREATININE 1.52* 1.45* 1.39* 1.51*  --  1.36*  CALCIUM 9.9 10.6* 10.4* 10.2 10.1 9.2  MG  --  2.0 2.0  --   --   --    Liver Function Tests: Recent Labs  Lab 07/25/19 0608 07/26/19 0558 07/29/19 0630 07/31/19 0454  AST 16 14*  --  34  ALT 15 14  --  38  ALKPHOS 72 64  --  57  BILITOT 0.5 0.5  --  0.3  PROT 6.6 6.2*  --  6.1*  ALBUMIN 3.0* 2.9* 2.7* 2.6*   No results for input(s): LIPASE, AMYLASE in the last 168 hours. No results for input(s): AMMONIA in the last 168 hours. Coagulation Profile: Recent Labs  Lab 07/29/19 0630  INR 1.2   CBC: Recent Labs  Lab  07/25/19 0608 07/26/19 0558 07/27/19 0552 07/29/19 0630  WBC 3.8* 3.4* 3.1* 3.5*  HGB 9.3* 9.2* 9.1* 9.6*  HCT 29.8* 29.9* 29.9* 30.9*  MCV 103.8* 105.7* 106.0* 104.0*  PLT 179 193 203 236   Cardiac Enzymes: No results for input(s): CKTOTAL, CKMB, CKMBINDEX, TROPONINI in the last 168 hours. BNP: Invalid input(s): POCBNP CBG: Recent Labs  Lab 07/30/19 1546 07/30/19 2032 07/31/19 0745 07/31/19 1241 07/31/19 1622  GLUCAP 101* 110* 80 120* 123*   HbA1C: No results for input(s): HGBA1C in the last 72 hours. Urine analysis:    Component Value Date/Time   COLORURINE YELLOW 07/24/2019 1841   APPEARANCEUR CLEAR 07/24/2019 1841   LABSPEC 1.025 07/24/2019 1841   PHURINE 6.0 07/24/2019 1841   GLUCOSEU NEGATIVE 07/24/2019 1841   HGBUR TRACE (A) 07/24/2019 1841   BILIRUBINUR SMALL (A) 07/24/2019 1841   KETONESUR NEGATIVE 07/24/2019 1841   PROTEINUR TRACE (A) 07/24/2019 1841   UROBILINOGEN 1.0 11/18/2014 1238   NITRITE NEGATIVE 07/24/2019 1841  LEUKOCYTESUR NEGATIVE 07/24/2019 1841   Sepsis Labs: @LABRCNTIP (procalcitonin:4,lacticidven:4) ) Recent Results (from the past 240 hour(s))  SARS CORONAVIRUS 2 ([place] 6-24 HRS) Nasopharyngeal Nasopharyngeal Swab     Status: None   Collection Time: 07/24/19  9:53 PM   Specimen: Nasopharyngeal Swab  Result Value Ref Range Status   SARS Coronavirus 2 NEGATIVE NEGATIVE Final    Comment: (NOTE) SARS-CoV-2 target nucleic acids are NOT DETECTED. The SARS-CoV-2 RNA is generally detectable in upper and lower respiratory specimens during the acute phase of infection. Negative results do not preclude SARS-CoV-2 infection, do not rule out co-infections with other pathogens, and should not be used as the sole basis for treatment or other patient management decisions. Negative results must be combined with clinical observations, patient history, and epidemiological information. The expected result is Negative. Fact Sheet for  Patients: SugarRoll.be Fact Sheet for Healthcare Providers: https://www.woods-mathews.com/ This test is not yet approved or cleared by the [place] FDA and  has been authorized for detection and/or diagnosis of SARS-CoV-2 by FDA under an Emergency Use Authorization (EUA). This EUA will remain  in effect (meaning this test can be used) for the duration of the COVID-19 declaration under Section 56 4(b)(1) of the Act, 21 U.S.C. section 360bbb-3(b)(1), unless the authorization is terminated or revoked sooner. Performed at [facility], [place] [address], [place], [place] [address]      Scheduled Meds:  amLODipine  5 mg Oral Daily   Chlorhexidine Gluconate Cloth  6 each Topical Daily   citalopram  20 mg Oral QHS   enoxaparin (LOVENOX) injection  40 mg Subcutaneous Q24H   feeding supplement (ENSURE ENLIVE)  237 mL Oral BID BM   folic acid  1 mg Oral Daily   gabapentin  600 mg Oral TID   insulin aspart  0-9 Units Subcutaneous TID WC   milk and molasses  1 enema Rectal Once   multivitamin with minerals  1 tablet Oral Daily   pantoprazole  40 mg Oral BID AC   primidone  75 mg Oral Q8H   propranolol  40 mg Oral BID   sucralfate  1 g Oral TID WC & HS   tamsulosin  0.4 mg Oral Daily   topiramate  200 mg Oral QHS   vitamin B-12  1,000 mcg Oral Daily   Continuous Infusions:  sodium chloride 125 mL/hr at 07/31/19 1643    Procedures/Studies: Ct Head Wo Contrast  Result Date: 07/24/2019 CLINICAL DATA:  Trauma. EXAM: CT HEAD WITHOUT CONTRAST CT CERVICAL SPINE WITHOUT CONTRAST TECHNIQUE: Multidetector CT imaging of the head and cervical spine was performed following the standard protocol without intravenous contrast. Multiplanar CT image reconstructions of the cervical spine were also generated. COMPARISON:  CT scan of November 16, 2016. FINDINGS: CT HEAD FINDINGS Brain: Mild chronic ischemic white matter disease is noted. No  mass effect or midline shift is noted. Ventricular size is within normal limits. There is no evidence of mass lesion, hemorrhage or acute infarction. Vascular: No hyperdense vessel or unexpected calcification. Skull: Normal. Negative for fracture or focal lesion. Sinuses/Orbits: Right frontal and ethmoid mucosal thickening is noted. Other: None. CT CERVICAL SPINE FINDINGS Alignment: Normal. Skull base and vertebrae: No acute fracture. No primary bone lesion or focal pathologic process. Soft tissues and spinal canal: No prevertebral fluid or swelling. No visible canal hematoma. Disc levels: Status post surgical anterior fusion of C5-6. Mild anterior osteophyte formation is also noted at C4-5 and C6-7. Upper chest: Negative. Other: None. IMPRESSION: Mild  chronic ischemic white matter disease. No acute intracranial abnormality seen. Postsurgical and degenerative changes are noted in the cervical spine. No fracture or significant spondylolisthesis is noted. Electronically Signed   By: Marijo Conception M.D.   On: 07/24/2019 16:20   Ct Chest W Contrast  Result Date: 07/25/2019 CLINICAL DATA:  Abdominal lymphadenopathy, evaluate chest, history of recent falls and weakness EXAM: CT CHEST WITH CONTRAST TECHNIQUE: Multidetector CT imaging of the chest was performed during intravenous contrast administration. CONTRAST:  58mL OMNIPAQUE IOHEXOL 300 MG/ML  SOLN COMPARISON:  CT abdomen pelvis, 07/24/2019, CT chest, 09/20/2009 FINDINGS: Cardiovascular: Aortic atherosclerosis. Normal heart size. Extensive 3 vessel coronary artery calcifications. No pericardial effusion. Mediastinum/Nodes: There is an enlarged paraesophageal lymph node measuring 2.0 x 1.4 cm at the level of T6-T7 (series 2, image 71). There are prominent periaortic and retrocrural nodes lower in the thorax (series 2, image 135, 153). There are enlarged left lower cervical/superior mediastinal lymph nodes measuring up to 1.2 x 1.0 cm (series 2, image 7). No other  enlarged mediastinal, hilar, or axillary lymph nodes. Thyroid gland, trachea, and esophagus demonstrate no significant findings. Lungs/Pleura: Lungs are clear. No pleural effusion or pneumothorax. Upper Abdomen: No acute abnormality. Bulky retroperitoneal lymphadenopathy, better assessed on prior CT of the abdomen pelvis. Musculoskeletal: No chest wall mass or suspicious bone lesions identified. IMPRESSION: 1. There is an enlarged paraesophageal lymph node measuring 2.0 x 1.4 cm at the level of T6-T7 (series 2, image 71). There are prominent periaortic and retrocrural nodes lower in the thorax (series 2, image 135, 153). 2. There are enlarged left lower cervical/superior mediastinal lymph nodes measuring up to 1.2 x 1.0 cm (series 2, image 7). 3.  Coronary artery disease.  Aortic Atherosclerosis (ICD10-I70.0). Electronically Signed   By: Eddie Candle M.D.   On: 07/25/2019 14:51   Ct Cervical Spine Wo Contrast  Result Date: 07/24/2019 CLINICAL DATA:  Trauma. EXAM: CT HEAD WITHOUT CONTRAST CT CERVICAL SPINE WITHOUT CONTRAST TECHNIQUE: Multidetector CT imaging of the head and cervical spine was performed following the standard protocol without intravenous contrast. Multiplanar CT image reconstructions of the cervical spine were also generated. COMPARISON:  CT scan of November 16, 2016. FINDINGS: CT HEAD FINDINGS Brain: Mild chronic ischemic white matter disease is noted. No mass effect or midline shift is noted. Ventricular size is within normal limits. There is no evidence of mass lesion, hemorrhage or acute infarction. Vascular: No hyperdense vessel or unexpected calcification. Skull: Normal. Negative for fracture or focal lesion. Sinuses/Orbits: Right frontal and ethmoid mucosal thickening is noted. Other: None. CT CERVICAL SPINE FINDINGS Alignment: Normal. Skull base and vertebrae: No acute fracture. No primary bone lesion or focal pathologic process. Soft tissues and spinal canal: No prevertebral fluid or  swelling. No visible canal hematoma. Disc levels: Status post surgical anterior fusion of C5-6. Mild anterior osteophyte formation is also noted at C4-5 and C6-7. Upper chest: Negative. Other: None. IMPRESSION: Mild chronic ischemic white matter disease. No acute intracranial abnormality seen. Postsurgical and degenerative changes are noted in the cervical spine. No fracture or significant spondylolisthesis is noted. Electronically Signed   By: Marijo Conception M.D.   On: 07/24/2019 16:20   Mr Thoracic Spine Wo Contrast  Result Date: 07/30/2019 CLINICAL DATA:  Suspected lymphoma.  Back pain and leg weakness. EXAM: MRI THORACIC SPINE WITHOUT CONTRAST TECHNIQUE: Multiplanar, multisequence MR imaging of the thoracic spine was performed. No intravenous contrast was administered. COMPARISON:  MRI 11/17/2016 FINDINGS: Alignment:  Normal Vertebrae: Normal.  No fracture or marrow space abnormality. Cord:  Normal.  No cord compression or primary cord lesion. Paraspinal and other soft tissues: Paraesophageal node as shown by CT. Tiny amount of pleural fluid. Disc levels: No significant disc disease in the thoracic region. No disc herniation or stenosis of the canal or foramina. No significant facet arthropathy. IMPRESSION: No significant thoracic spinal abnormality. No significant degenerative disease, stenosis or nerve compression. No evidence of fracture or bone marrow lesion. Paraesophageal lymph node as shown by CT. Electronically Signed   By: Nelson Chimes M.D.   On: 07/30/2019 15:15   Mr Lumbar Spine Wo Contrast  Result Date: 07/30/2019 CLINICAL DATA:  Generalized weakness and back pain. Frequent falling. EXAM: MRI LUMBAR SPINE WITHOUT CONTRAST TECHNIQUE: Multiplanar, multisequence MR imaging of the lumbar spine was performed. No intravenous contrast was administered. COMPARISON:  CT 07/24/2019.  MRI 11/17/2016 FINDINGS: Segmentation:  5 lumbar type vertebral bodies. Alignment:  Normal Vertebrae:  No fracture or  primary bone lesion. Conus medullaris and cauda equina: Conus extends to the L1 level. Conus and cauda equina appear normal. Paraspinal and other soft tissues: Massive retroperitoneal lymphadenopathy as seen on the recent CT. Not present in January of 2018. Disc levels: No significant finding at T12-L1 or L1-2. L2-3: Mild desiccation and bulging of the disc. No compressive stenosis. L3-4: Normal interspace. L4-5: Mild bulging of the disc. No compressive stenosis. Mild facet osteoarthritis. L5-S1: Previous right hemilaminectomy. Right posterolateral disc herniation/scarring with narrowing of the right lateral recess that could compress the right S1 nerve. Similar appearance to the study of 2018. IMPRESSION: Massive retroperitoneal lymphadenopathy. No evidence of marrow space tumor or fracture. Previous right hemilaminectomy at L5-S1. Right lateral recess stenosis because of protruding disc material and scarring could possibly affect the right S1 nerve. Appearance is unchanged since 2018. Electronically Signed   By: Nelson Chimes M.D.   On: 07/30/2019 15:12   Ct Abdomen Pelvis W Contrast  Result Date: 07/24/2019 CLINICAL DATA:  Bilateral leg weakness. Patient fell in the bathtub today. EXAM: CT ABDOMEN AND PELVIS WITH CONTRAST TECHNIQUE: Multidetector CT imaging of the abdomen and pelvis was performed using the standard protocol following bolus administration of intravenous contrast. CONTRAST:  65mL OMNIPAQUE IOHEXOL 300 MG/ML SOLN, 39mL OMNIPAQUE IOHEXOL 300 MG/ML SOLN COMPARISON:  06/09/2011 FINDINGS: Lower chest: Minor linear atelectasis or scarring in the anterior lung bases. Lung bases otherwise clear. Heart normal in size. Hepatobiliary: No focal liver abnormality is seen. No gallstones, gallbladder wall thickening, or biliary dilatation. Pancreas: Unremarkable. No pancreatic ductal dilatation or surrounding inflammatory changes. Spleen: Normal in size without focal abnormality. Adrenals/Urinary Tract: No  adrenal masses. Kidneys are normal in position. There is bilateral renal cortical thinning. 18 mm low-attenuation mass, posterior midpole the left kidney, consistent with a cyst. No other renal masses. Small nonobstructing stones in both kidneys. No hydronephrosis. Ureters normal in course and in caliber. No ureteral stones. Bladder moderately distended. There are dependent stones with some mild increased attenuation in the dependent bladder is well, likely debris. No wall thickening or convincing mass. Stomach/Bowel: Stomach is unremarkable. Small bowel and colon are normal in caliber. No wall thickening. No inflammation. Mild increased colonic stool burden. No evidence of appendicitis. Appendix not visualized. Vascular/Lymphatic: There is extensive adenopathy. Bulky retroperitoneal adenopathy is noted extending from level of the adrenal glands into the pelvis. Node at the level of the midpole the left kidney, left periaortic, measures 4.3 cm in short axis. Left periaortic node at the level of the bifurcation  measures 4.3 cm short axis. There are bulky left pelvic lymph nodes. Posteroinferior pelvic node, to the left of the lower sigmoid colon, measures 7.4 x 4.6 cm transversely. Adjacent left external iliac chain node measures 7.2 x 3.5 cm adenopathy surrounds the common iliac veins, greater on the left, as well as the left external iliac vein. It is not defined and may be occluded. No mesenteric adenopathy. No enlarged inguinal lymph nodes. Dense aortic atherosclerosis.  No aneurysm. Reproductive: Prostate is enlarged measuring 5.9 x 5.5 x 5.6 cm. Other: No abdominal wall hernia or abnormality. No abdominopelvic ascites. Musculoskeletal: No fracture or acute finding. No osteoblastic or osteolytic lesions. IMPRESSION: 1. There is bulky retroperitoneal and left pelvic lymphadenopathy that has developed since the prior CT. Adenopathy may occlude the left common and/or external iliac vein. Differential diagnosis  includes lymphoma and other lymphoproliferative disorders. Consider the possibility of prostate carcinoma metastatic to lymph nodes. 2. No acute findings within the abdomen or pelvis. 3. Bilateral intrarenal stones. Dependent bladder stones as well as a small amount of dependent bladder debris. 4. Bilateral renal cortical thinning. 5. Aortic atherosclerosis. Electronically Signed   By: Lajean Manes M.D.   On: 07/24/2019 20:21   US Renal  Result Date: 07/25/2019 CLINICAL DATA:  Acute kidney injury. EXAM: RENAL / URINARY TRACT ULTRASOUND COMPLETE COMPARISON:  CT abdomen and pelvis 07/24/2019. FINDINGS: Right Kidney: Renal measurements: 10.4 x 5.1 x 4.9 cm = volume: 138 mL . Echogenicity within normal limits. No mass or hydronephrosis visualized. Cortex is thinned. Punctate nonobstructing stone in the lower pole seen on prior CT noted. Left Kidney: Renal measurements: 11.2 x 5.9 x 5.1 cm = volume: 180 mL. Echogenicity within normal limits. No solid mass or hydronephrosis visualized. Cortex is mildly thinned. Simple 2.1 cm cyst noted. Small nonobstructing stones are seen as on prior CT. Bladder: Bladder calculi are identified as seen on prior CT. The bladder is distended. IMPRESSION: No acute abnormality.  Negative for hydronephrosis. Bilateral nonobstructing renal stones, more numerous on the left. Distended urinary bladder. Electronically Signed   By: Inge Rise M.D.   On: 07/25/2019 14:48   Ct Biopsy  Result Date: 07/30/2019 INDICATION: 73 year old male with newly diagnosed extensive pelvic and retroperitoneal lymphadenopathy concerning for lymphoma versus metastatic pancreatic cancer. EXAM: CT-guided biopsy retroperitoneal lymph node MEDICATIONS: None. ANESTHESIA/SEDATION: Moderate (conscious) sedation was employed during this procedure. A total of Versed 1 mg and Fentanyl 50 mcg was administered intravenously. Moderate Sedation Time: 10 minutes. The patient's level of consciousness and vital signs  were monitored continuously by radiology nursing throughout the procedure under my direct supervision. FLUOROSCOPY TIME:  None COMPLICATIONS: None immediate. PROCEDURE: Informed written consent was obtained from the patient after a thorough discussion of the procedural risks, benefits and alternatives. All questions were addressed. Maximal Sterile Barrier Technique was utilized including caps, mask, sterile gowns, sterile gloves, sterile drape, hand hygiene and skin antiseptic. A timeout was performed prior to the initiation of the procedure. A planning axial CT scan was performed. A suitable left retroperitoneal lymph node was identified. A suitable skin entry site was selected and marked. After sterile prep and drape in the standard fashion with chlorhexidine skin prep, local anesthesia was attained by infiltration with 1% lidocaine. A small dermatotomy was made. Under intermittent CT guidance, a 17 gauge introducer needle was advanced and positioned at the margin of the enlarged lymph node. Multiple 18 gauge core biopsies were then coaxially obtained using the bio Pince automated biopsy device. Biopsy specimens were  placed in saline and delivered to pathology for further evaluation. Post biopsy CT imaging demonstrates no evidence of immediate complication. IMPRESSION: Technically successful CT-guided core biopsy of left retroperitoneal lymphadenopathy. Electronically Signed   By: Jacqulynn Cadet M.D.   On: 07/30/2019 16:09   Dg Abd Acute 2+v W 1v Chest  Result Date: 07/24/2019 CLINICAL DATA:  Fall with hip pain EXAM: DG ABDOMEN ACUTE W/ 1V CHEST COMPARISON:  11/16/2016 chest x-ray FINDINGS: Single-view chest demonstrate surgical hardware in the lower cervical spine. No focal opacity or pleural effusion. Normal heart size. No pneumothorax. Supine and upright views of the abdomen demonstrate no free air beneath the diaphragm. Nonobstructed gas pattern with moderate stool. No radiopaque calculi. Phleboliths in  the pelvis IMPRESSION: Negative abdominal radiographs.  No acute cardiopulmonary disease. Electronically Signed   By: Donavan Foil M.D.   On: 07/24/2019 15:37   Dg Hip Unilat W Or Wo Pelvis 2-3 Views Left  Result Date: 07/24/2019 CLINICAL DATA:  Fall with left hip pain EXAM: DG HIP (WITH OR WITHOUT PELVIS) 2-3V LEFT COMPARISON:  11/16/2016, CT 06/09/2011 FINDINGS: SI joints are non widened. Pubic symphysis and rami appear intact. No fracture or malalignment. Mild degenerative changes of the left hip with mild joint space narrowing and osteophytosis at the left femoral head neck junction. IMPRESSION: No acute osseous abnormality Electronically Signed   By: Donavan Foil M.D.   On: 07/24/2019 15:36    Orson [name], DO  [facility] Pager [phone]  If 7PM-7AM, please contact night-coverage [email or web address] Password [email or web address] 07/31/2019, 5:29 PM   LOS: 6 days

## 2019-07-31 NOTE — Progress Notes (Signed)
Dulcolax suppository given as ordered for reports of constipation. MOM enema to be given if no results from suppository per Quinn Axe, NP. Patient had stool results after dulcolax. States feels "much better". Notified Quinn Axe, NP. Enema not needed at this time. Donavan Foil, RN

## 2019-07-31 NOTE — Progress Notes (Signed)
Physical Therapy Treatment Patient Details Name: Daniel Richards MRN: [id number] DOB: 15-Jul-1946 Today's Date: 07/31/2019    History of Present Illness [name]  is a 73 y.o. male, with history of hypertension, hyperlipidemia, diabetes mellitus type 2, tremor came to ED with chief complaints of falls at home.  Patient says that he has been feeling more weak and is having recurrent falls at home.  Also complains of abdominal pain.He denies passing out today.  But says that he has passed out in the past.    PT Comments    Patient demonstrates increased endurance/distance for ambulation without loss of balance demonstrating slightly labored cadence, limited mostly due to c/o fatigue and tolerated sitting up in chair after therapy.  Patient will benefit from continued physical therapy in hospital and recommended venue below to increase strength, balance, endurance for safe ADLs and gait.   Follow Up Recommendations  SNF     Equipment Recommendations  None recommended by PT    Recommendations for Other Services       Precautions / Restrictions Precautions Precautions: Fall Restrictions Weight Bearing Restrictions: No    Mobility  Bed Mobility Overal bed mobility: Needs Assistance Bed Mobility: Supine to Sit     Supine to sit: Min guard;Min assist     General bed mobility comments: slightly labored movement  Transfers Overall transfer level: Needs assistance Equipment used: Rolling walker (2 wheeled) Transfers: Sit to/from [facility] Sit to Stand: Min guard;Min assist Stand pivot transfers: Min guard;Min assist       General transfer comment: slightly labored movement  Ambulation/Gait Ambulation/Gait assistance: Min assist;Min guard Gait Distance (Feet): 50 Feet Assistive device: Rolling walker (2 wheeled) Gait Pattern/deviations: Decreased step length - right;Decreased step length - left;Decreased stride length Gait velocity: decreased   General  Gait Details: increased endurance/distance for ambulation with slightly labored cadence, no loss of balance   Stairs             Wheelchair Mobility    Modified Rankin (Stroke Patients Only)       Balance Overall balance assessment: Needs assistance Sitting-balance support: Feet supported;No upper extremity supported Sitting balance-Leahy Scale: Good Sitting balance - Comments: seated at bedside   Standing balance support: During functional activity;Bilateral upper extremity supported Standing balance-Leahy Scale: Fair Standing balance comment: using RW                            Cognition Arousal/Alertness: Awake/alert Behavior During Therapy: WFL for tasks assessed/performed Overall Cognitive Status: Within Functional Limits for tasks assessed                                        Exercises General Exercises - Lower Extremity Long Arc Quad: Seated;AROM;Strengthening;Both;10 reps Hip Flexion/Marching: Seated;AROM;Strengthening;Both;10 reps Toe Raises: Seated;AROM;Strengthening;Both;10 reps Heel Raises: Seated;AROM;Strengthening;Both;10 reps    General Comments        Pertinent Vitals/Pain Pain Assessment: No/denies pain    Home Living                      Prior Function            PT Goals (current goals can now be found in the care plan section) Acute Rehab PT Goals Patient Stated Goal: return home after rehab PT Goal Formulation: With patient Time For Goal Achievement: 08/08/19 Potential to  Achieve Goals: Good Progress towards PT goals: Progressing toward goals    Frequency    Min 3X/week      PT Plan Current plan remains appropriate    Co-evaluation              AM-PAC PT "6 Clicks" Mobility   Outcome Measure  Help needed turning from your back to your side while in a flat bed without using bedrails?: None Help needed moving from lying on your back to sitting on the side of a flat bed  without using bedrails?: A Little Help needed moving to and from a bed to a chair (including a wheelchair)?: A Little Help needed standing up from a chair using your arms (e.g., wheelchair or bedside chair)?: A Little Help needed to walk in hospital room?: A Little Help needed climbing 3-5 steps with a railing? : A Lot 6 Click Score: 18    End of Session   Activity Tolerance: Patient tolerated treatment well;Patient limited by fatigue Patient left: in chair;with call bell/phone within reach;with chair alarm set Nurse Communication: Mobility status PT Visit Diagnosis: Other abnormalities of gait and mobility (R26.89);Unsteadiness on feet (R26.81);Muscle weakness (generalized) (M62.81)     Time: [id number] PT Time Calculation (min) (ACUTE ONLY): 31 min  Charges:  $Gait Training: 8-22 mins $Therapeutic Exercise: 8-22 mins                     12:32 PM, 07/31/19 Lonell Grandchild, MPT Physical Therapist with [facility] [phone] office [phone] mobile phone

## 2019-07-31 NOTE — Progress Notes (Signed)
Patient had only voided 50 ml urine this afternoon. Bladder scanned again this evening per discussion with Dr. Carles Collet this afternoon. Bladder scan volume >200 ml. Order received to place Foley catheter. 16 Fr. Foley inserted as ordered. Patient tolerated well, no complaints. 800 ml amber colored urine returned. Notified MD. Donavan Foil, RN

## 2019-07-31 NOTE — Consult Note (Signed)
[facility] Oncology Progress Note  Name: EDRIAN LESTAGE      MRN: [id number]    Location: A301/A301-01  Date: 07/31/2019 Time:7:57 AM   Subjective: Interval History:Luther D Wenrick seen for follow-up of generalized lymphadenopathy.  He had left para-aortic lymph node biopsy done by interventional radiology yesterday.  He tolerated the procedure well.  He was lying in the bed since then.  Denies any nausea or vomiting.  He is not eating any solid food as it is not appetizing.  Did not have bowel movement for the last 1 week.  Vague lower quadrant abdominal pain is also unchanged.  Objective: Vital signs in last 24 hours: Temp:  [97.5 F (36.4 C)-98.4 F (36.9 C)] 98 F (36.7 C) (09/24 0508) Pulse Rate:  [65-79] 79 (09/24 0508) Resp:  [14-21] 18 (09/24 0508) BP: (103-126)/(53-86) 103/53 (09/24 0508) SpO2:  [94 %-99 %] 95 % (09/24 0508)    Intake/Output from previous day: 09/23 0800 - 09/24 0759 In: 1698.3 [P.O.:240; I.V.:1458.3] Out: 650 [Urine:650]    Intake/Output this shift: Total I/O In: 1698.3 [P.O.:240; I.V.:1458.3] Out: 650 [Urine:650]   PHYSICAL EXAM: BP (!) 103/53 (BP Location: Right Arm)   Pulse 79   Temp 98 F (36.7 C) (Oral)   Resp 18   Ht 6' (1.829 m)   Wt 199 lb 1.2 oz (90.3 kg)   SpO2 95%   BMI 27.00 kg/m  General appearance: alert, cooperative and appears stated age Lungs: clear to auscultation bilaterally Heart: regular rate and rhythm Abdomen: soft, non-tender; bowel sounds normal; no masses,  no organomegaly Extremities: extremities normal, atraumatic, no cyanosis or edema Skin: Skin color, texture, turgor normal. No rashes or lesions Lymph nodes: Cervical, supraclavicular, and axillary nodes normal. Neurologic: Grossly normal   Studies/Results: Results for orders placed or performed during the hospital encounter of 07/24/19 (from the past 48 hour(s))  Glucose, capillary     Status: Abnormal   Collection Time: 07/29/19  8:05 AM  Result  Value Ref Range   Glucose-Capillary 123 (H) 70 - 99 mg/dL  Glucose, capillary     Status: Abnormal   Collection Time: 07/29/19 11:02 AM  Result Value Ref Range   Glucose-Capillary 126 (H) 70 - 99 mg/dL  PTH, intact and calcium     Status: Abnormal   Collection Time: 07/29/19 12:44 PM  Result Value Ref Range   PTH 14 (L) 15 - 65 pg/mL   Calcium, Total (PTH) 10.1 8.6 - 10.2 mg/dL   PTH Interp Comment     Comment: (NOTE) Interpretation                 Intact PTH    Calcium                                (pg/mL)      (mg/dL) Normal                          15 - 65     8.6 - 10.2 Primary Hyperparathyroidism         >65          >10.2 Secondary Hyperparathyroidism       >65          <10.2 Non-Parathyroid Hypercalcemia       <65          >10.2 Hypoparathyroidism                  <  15          < 8.6 Non-Parathyroid Hypocalcemia    15 - 65          < 8.6 Performed At: [facility] [place], [place] [id number] Rush Farmer MD [id number]   Glucose, capillary     Status: Abnormal   Collection Time: 07/29/19  4:07 PM  Result Value Ref Range   Glucose-Capillary 130 (H) 70 - 99 mg/dL  Glucose, capillary     Status: Abnormal   Collection Time: 07/29/19  9:05 PM  Result Value Ref Range   Glucose-Capillary 105 (H) 70 - 99 mg/dL  Glucose, capillary     Status: Abnormal   Collection Time: 07/30/19  7:46 AM  Result Value Ref Range   Glucose-Capillary 128 (H) 70 - 99 mg/dL  Glucose, capillary     Status: Abnormal   Collection Time: 07/30/19 12:28 PM  Result Value Ref Range   Glucose-Capillary 129 (H) 70 - 99 mg/dL  Glucose, capillary     Status: Abnormal   Collection Time: 07/30/19  3:46 PM  Result Value Ref Range   Glucose-Capillary 101 (H) 70 - 99 mg/dL  Glucose, capillary     Status: Abnormal   Collection Time: 07/30/19  8:32 PM  Result Value Ref Range   Glucose-Capillary 110 (H) 70 - 99 mg/dL  Comprehensive metabolic panel     Status: Abnormal   Collection  Time: 07/31/19  4:54 AM  Result Value Ref Range   Sodium 138 135 - 145 mmol/L   Potassium 4.1 3.5 - 5.1 mmol/L   Chloride 110 98 - 111 mmol/L   CO2 22 22 - 32 mmol/L   Glucose, Bld 88 70 - 99 mg/dL   BUN 23 8 - 23 mg/dL   Creatinine, Ser 1.36 (H) 0.61 - 1.24 mg/dL   Calcium 9.2 8.9 - 10.3 mg/dL   Total Protein 6.1 (L) 6.5 - 8.1 g/dL   Albumin 2.6 (L) 3.5 - 5.0 g/dL   AST 34 15 - 41 U/L   ALT 38 0 - 44 U/L   Alkaline Phosphatase 57 38 - 126 U/L   Total Bilirubin 0.3 0.3 - 1.2 mg/dL   GFR calc non Af Amer 51 (L) >60 mL/min   GFR calc Af Amer 59 (L) >60 mL/min   Anion gap 6 5 - 15    Comment: Performed at [facility], [address], [place], [place] [address]  Glucose, capillary     Status: None   Collection Time: 07/31/19  7:45 AM  Result Value Ref Range   Glucose-Capillary 80 70 - 99 mg/dL   Mr Thoracic Spine Wo Contrast  Result Date: 07/30/2019 CLINICAL DATA:  Suspected lymphoma.  Back pain and leg weakness. EXAM: MRI THORACIC SPINE WITHOUT CONTRAST TECHNIQUE: Multiplanar, multisequence MR imaging of the thoracic spine was performed. No intravenous contrast was administered. COMPARISON:  MRI 11/17/2016 FINDINGS: Alignment:  Normal Vertebrae: Normal.  No fracture or marrow space abnormality. Cord:  Normal.  No cord compression or primary cord lesion. Paraspinal and other soft tissues: Paraesophageal node as shown by CT. Tiny amount of pleural fluid. Disc levels: No significant disc disease in the thoracic region. No disc herniation or stenosis of the canal or foramina. No significant facet arthropathy. IMPRESSION: No significant thoracic spinal abnormality. No significant degenerative disease, stenosis or nerve compression. No evidence of fracture or bone marrow lesion. Paraesophageal lymph node as shown by CT. Electronically Signed   By: Jan Fireman.D.  On: 07/30/2019 15:15   Mr Lumbar Spine Wo Contrast  Result Date: 07/30/2019 CLINICAL DATA:  Generalized weakness and back pain.  Frequent falling. EXAM: MRI LUMBAR SPINE WITHOUT CONTRAST TECHNIQUE: Multiplanar, multisequence MR imaging of the lumbar spine was performed. No intravenous contrast was administered. COMPARISON:  CT 07/24/2019.  MRI 11/17/2016 FINDINGS: Segmentation:  5 lumbar type vertebral bodies. Alignment:  Normal Vertebrae:  No fracture or primary bone lesion. Conus medullaris and cauda equina: Conus extends to the L1 level. Conus and cauda equina appear normal. Paraspinal and other soft tissues: Massive retroperitoneal lymphadenopathy as seen on the recent CT. Not present in January of 2018. Disc levels: No significant finding at T12-L1 or L1-2. L2-3: Mild desiccation and bulging of the disc. No compressive stenosis. L3-4: Normal interspace. L4-5: Mild bulging of the disc. No compressive stenosis. Mild facet osteoarthritis. L5-S1: Previous right hemilaminectomy. Right posterolateral disc herniation/scarring with narrowing of the right lateral recess that could compress the right S1 nerve. Similar appearance to the study of 2018. IMPRESSION: Massive retroperitoneal lymphadenopathy. No evidence of marrow space tumor or fracture. Previous right hemilaminectomy at L5-S1. Right lateral recess stenosis because of protruding disc material and scarring could possibly affect the right S1 nerve. Appearance is unchanged since 2018. Electronically Signed   By: Nelson Chimes M.D.   On: 07/30/2019 15:12   Ct Biopsy  Result Date: 07/30/2019 INDICATION: 73 year old male with newly diagnosed extensive pelvic and retroperitoneal lymphadenopathy concerning for lymphoma versus metastatic pancreatic cancer. EXAM: CT-guided biopsy retroperitoneal lymph node MEDICATIONS: None. ANESTHESIA/SEDATION: Moderate (conscious) sedation was employed during this procedure. A total of Versed 1 mg and Fentanyl 50 mcg was administered intravenously. Moderate Sedation Time: 10 minutes. The patient's level of consciousness and vital signs were monitored  continuously by radiology nursing throughout the procedure under my direct supervision. FLUOROSCOPY TIME:  None COMPLICATIONS: None immediate. PROCEDURE: Informed written consent was obtained from the patient after a thorough discussion of the procedural risks, benefits and alternatives. All questions were addressed. Maximal Sterile Barrier Technique was utilized including caps, mask, sterile gowns, sterile gloves, sterile drape, hand hygiene and skin antiseptic. A timeout was performed prior to the initiation of the procedure. A planning axial CT scan was performed. A suitable left retroperitoneal lymph node was identified. A suitable skin entry site was selected and marked. After sterile prep and drape in the standard fashion with chlorhexidine skin prep, local anesthesia was attained by infiltration with 1% lidocaine. A small dermatotomy was made. Under intermittent CT guidance, a 17 gauge introducer needle was advanced and positioned at the margin of the enlarged lymph node. Multiple 18 gauge core biopsies were then coaxially obtained using the bio Pince automated biopsy device. Biopsy specimens were placed in saline and delivered to pathology for further evaluation. Post biopsy CT imaging demonstrates no evidence of immediate complication. IMPRESSION: Technically successful CT-guided core biopsy of left retroperitoneal lymphadenopathy. Electronically Signed   By: Jacqulynn Cadet M.D.   On: 07/30/2019 16:09     MEDICATIONS: I have reviewed the patient's current medications.     Assessment/Plan:  1.  Generalized lymphadenopathy: - 80-month history of progressive weakness with no significant weight loss, fevers or night sweats. - CTAP showed bulky retroperitoneal adenopathy extending from the level of the adrenal glands into the pelvis.  Left pelvic adenopathy is measuring 7.4 x 4.6 cm.  The left external iliac lymph node measures 7.2 x 3.5 cm.  No mesenteric adenopathy.  CT of the chest showed  enlarged paraesophageal lymph node measuring  2 x 1.4 cm.  Prominent retrocrural lymph node. -LDH is within normal limits. -Status post CT-guided biopsy of the left retroperitoneal lymph node by IR at on 07/30/2019. - Pathology report pending at this time.  If it is suggestive of aggressive lymphoma, will consider treatment as soon as possible. - If patient has difficulty standing on his feet, consider doing MRI of the thoracic and lumbar spine. - If he is discharged to the rehab, he will see me in the office next week to discuss results and further plan.  2.  Hypercalcemia: - Presentation with calcium level of 10.6 with albumin of 3. - Zometa on 07/28/2019 with calcitonin. -Calcium improved to 9.2 today with albumin of 2.6.  Corrected calcium for albumin of 4 is 10.3.  3.  [id number] and folic acid deficiency: - Macrocytic anemia and pancytopenia at presentation. -She will continue [id number] and folic acid supplements.  All questions were answered. The patient knows to call the clinic with any problems, questions or concerns. We can certainly see the patient much sooner if necessary.     Derek Jack

## 2019-07-31 NOTE — Consult Note (Signed)
Referring Provider: Orson [name], MD Primary Care Physician:  Scotty Court, DO Primary Gastroenterologist:  Garfield Cornea, MD  Reason for Consultation:  odynophagia  HPI: Daniel Richards is a 73 y.o. male with past medical history of diabetes mellitus, hypertension, tremor came to the emergency department on 07/24/2019 with complaints of overall weakness and recurrent falls at home.  We have been asked to see the patient regarding odynophagia which began yesterday.  While in the ED he was found to have hypercalcemia, low B12, low folate, mild macrocytic anemia CT abdomen pelvis with contrast showed bulky retroperitoneal and left pelvic lymphadenopathy with possible left common and/or external iliac vein occlusion.  Chest CT with contrast showed enlarged paraesophageal lymph node measuring 2 x 1.4 cm at the level of T6-T7.  Prominent periaortic and retrocrural nodes lower in the thorax.  Enlarged left lower cervical/superior mediastinal lymph nodes measuring 1.2 x 1.  Esophagus appeared normal.  Patient underwent CT-guided biopsy of left retroperitoneal lymph node yesterday.  Pathology pending.  Patient states that yesterday after returning from his biopsy he started having pain with swallowing.  Describes burning pain when swallowing liquids.  Has not swallowed solids in days.  He has had a significant decline in oral intake, lack of appetite and nausea.  Reports no significant oral intake in 3 to 4 days.  No prior issues with dysphagia or odynophagia.  No significant heartburn issues.  Complains of nausea without vomiting.  Vague intermittent abdominal pain.  Last bowel movement 5 days ago.  No melena or rectal bleeding.  Denies oral/mouth pain or discomfort.   Prior to Admission medications   Medication Sig Start Date End Date Taking? Authorizing Provider  calcitRIOL (ROCALTROL) 0.5 MCG capsule Take 1 capsule by mouth daily. 05/20/19  Yes [provider]  citalopram (CELEXA) 20 MG tablet  Take 20 mg by mouth at bedtime.   Yes [provider]  gabapentin (NEURONTIN) 300 MG capsule Take 600 mg by mouth 3 (three) times daily.    Yes [provider]  INVOKANA 100 MG TABS tablet Take 1 tablet by mouth daily. 07/17/19  Yes [provider]  lisinopril (PRINIVIL,ZESTRIL) 20 MG tablet Take 20 mg by mouth at bedtime.    Yes [provider]  metFORMIN (GLUCOPHAGE) 500 MG tablet Take 1 tablet by mouth daily. 07/02/19  Yes [provider]  primidone (MYSOLINE) 50 MG tablet Take 1.5 tablets (75 mg total) by mouth every 8 (eight) hours. Patient taking differently: Take 150 mg by mouth every 8 (eight) hours.  11/18/16  Yes Isaac Bliss, Rayford Halsted, MD  propranolol (INDERAL) 40 MG tablet Take 40 mg by mouth 2 (two) times daily.   Yes [provider]  tamsulosin (FLOMAX) 0.4 MG CAPS capsule Take 1 capsule by mouth daily. 07/02/19  Yes [provider]  topiramate (TOPAMAX) 100 MG tablet Take 200 mg by mouth at bedtime.   Yes [provider]    Current Facility-Administered Medications  Medication Dose Route Frequency Provider Last Rate Last Dose  . 0.9 %  sodium chloride infusion   Intravenous Continuous Tat, Shanon Brow, MD 125 mL/hr at 07/30/19 1620    . amLODipine (NORVASC) tablet 5 mg  5 mg Oral Daily Murlean Iba, MD   Stopped at 07/30/19 0908  . Chlorhexidine Gluconate Cloth 2 % PADS 6 each  6 each Topical Daily Murlean Iba, MD   6 each at 07/29/19 [phone]  . citalopram (CELEXA) tablet 20 mg  20 mg  Oral QHS Oswald Hillock, MD   20 mg at 07/30/19 2251  . enoxaparin (LOVENOX) injection 40 mg  40 mg Subcutaneous Q24H Tat, David, MD      . feeding supplement (ENSURE ENLIVE) (ENSURE ENLIVE) liquid 237 mL  237 mL Oral BID BM Johnson, Clanford L, MD   237 mL at 07/30/19 1622  . folic acid (FOLVITE) tablet 1 mg  1 mg Oral Daily Johnson, Clanford L, MD   1 mg at 07/29/19 0949  . gabapentin (NEURONTIN) capsule 600 mg  600 mg  Oral TID Oswald Hillock, MD   600 mg at 07/30/19 2251  . hydrALAZINE (APRESOLINE) tablet 25 mg  25 mg Oral Q6H PRN Oswald Hillock, MD      . HYDROcodone-acetaminophen (NORCO) 10-325 MG per tablet 1 tablet  1 tablet Oral Q4H PRN Murlean Iba, MD   1 tablet at 07/31/19 0206  . insulin aspart (novoLOG) injection 0-9 Units  0-9 Units Subcutaneous TID WC Oswald Hillock, MD   1 Units at 07/30/19 1231  . multivitamin with minerals tablet 1 tablet  1 tablet Oral Daily Wynetta Emery, Clanford L, MD   1 tablet at 07/29/19 [phone]  . ondansetron (ZOFRAN) tablet 4 mg  4 mg Oral Q6H PRN Oswald Hillock, MD   4 mg at 07/29/19 0949   Or  . ondansetron (ZOFRAN) injection 4 mg  4 mg Intravenous Q6H PRN Oswald Hillock, MD   4 mg at 07/30/19 1231  . primidone (MYSOLINE) tablet 75 mg  75 mg Oral Q8H Oswald Hillock, MD   75 mg at 07/31/19 [id number]  . propranolol (INDERAL) tablet 40 mg  40 mg Oral BID Oswald Hillock, MD   40 mg at 07/30/19 2250  . tamsulosin (FLOMAX) capsule 0.4 mg  0.4 mg Oral Daily Oswald Hillock, MD   0.4 mg at 07/30/19 1624  . topiramate (TOPAMAX) tablet 200 mg  200 mg Oral QHS Oswald Hillock, MD   200 mg at 07/30/19 2251  . vitamin B-12 (CYANOCOBALAMIN) tablet 1,000 mcg  1,000 mcg Oral Daily Wynetta Emery, Clanford L, MD   1,000 mcg at 07/29/19 0949    Allergies as of 07/24/2019  . (No Known Allergies)    Past Medical History:  Diagnosis Date  . Anxiety and depression   . Diabetes mellitus 06/09/2011   pt taking metformin  . DJD of shoulder    right   . Hyperlipidemia   . Hypertension   . Migraines   . Tremor   . Vitamin D deficiency     Past Surgical History:  Procedure Laterality Date  . BACK SURGERY    . KNEE SURGERY    . SHOULDER SURGERY      Family History  Problem Relation Age of Onset  . Cancer Sister   . Cancer Sister   . Cancer Brother   . Migraines Neg Hx     Social History   Socioeconomic History  . Marital status: Divorced    Spouse name: Not on file  . Number of children:  2  . Years of education: 12+  . Highest education level: Not on file  Occupational History  . Not on file  Social Needs  . Financial resource strain: Not on file  . Food insecurity    Worry: Not on file    Inability: Not on file  . Transportation needs    Medical: Not on file    Non-medical: Not on file  Tobacco Use  . Smoking status: Never Smoker  . Smokeless tobacco: Never Used  Substance and Sexual Activity  . Alcohol use: No  . Drug use: No  . Sexual activity: Not on file  Lifestyle  . Physical activity    Days per week: Not on file    Minutes per session: Not on file  . Stress: Not on file  Relationships  . Social Herbalist on phone: Not on file    Gets together: Not on file    Attends religious service: Not on file    Active member of club or organization: Not on file    Attends meetings of clubs or organizations: Not on file    Relationship status: Not on file  . Intimate partner violence    Fear of current or ex partner: Not on file    Emotionally abused: Not on file    Physically abused: Not on file    Forced sexual activity: Not on file  Other Topics Concern  . Not on file  Social History Narrative   Lives at home with son.   Caffeine use: Drinks 1 cup coffee (3 cups per week-decaf)     ROS:  General: Negative for a  fever, chills, positive anorexia, positive fatigue, positive weakness. Eyes: Negative for vision changes.  ENT: Negative for hoarseness, difficulty swallowing , nasal congestion.  See HPI CV: Negative for chest pain, angina, palpitations, dyspnea on exertion, peripheral edema.  Respiratory: Negative for dyspnea at rest, dyspnea on exertion, cough, sputum, wheezing.  GI: See history of present illness. GU:  Negative for dysuria, hematuria, urinary incontinence, urinary frequency, nocturnal urination.  MS: Negative for joint pain, low back pain.  Derm: Negative for rash or itching.  Neuro: Negative for weakness, abnormal  sensation, seizure, frequent headaches, memory loss, confusion.  Positive chronic tremors Psych: Negative for anxiety, depression, suicidal ideation, hallucinations.  Endo: Negative for unusual weight change.  Heme: Negative for bruising or bleeding. Allergy: Negative for rash or hives.       Physical Examination: Vital signs in last 24 hours: Temp:  [97.5 F (36.4 C)-98.4 F (36.9 C)] 98 F (36.7 C) (09/24 0508) Pulse Rate:  [65-79] 79 (09/24 0508) Resp:  [14-21] 18 (09/24 0508) BP: (103-126)/(53-86) 103/53 (09/24 0508) SpO2:  [94 %-99 %] 95 % (09/24 0508) Last BM Date: 07/26/19  General: Well-nourished, well-developed in no acute distress.  Rest tremors noted Head: Normocephalic, atraumatic.   Eyes: Conjunctiva pink, no icterus. Mouth: Oropharyngeal mucosa moist and pink , no lesions erythema or exudate.  Upper and lower dentures Neck: Supple without thyromegaly, masses, or lymphadenopathy.  Lungs: Clear to auscultation bilaterally.  Heart: Regular rate and rhythm, no murmurs rubs or gallops.  Abdomen: Bowel sounds are normal, diffuse mild tenderness, nondistended, no hepatosplenomegaly or masses, no abdominal bruits or    hernia , no rebound or guarding.   Rectal: Not performed Extremities: No lower extremity edema, clubbing, deformity.  Neuro: Alert and oriented x 4 , grossly normal neurologically.  Skin: Warm and dry, no rash or jaundice.   Psych: Alert and cooperative, normal mood and affect.        Intake/Output from previous day: 09/23 0701 - 09/24 0700 In: 1698.3 [P.O.:240; I.V.:1458.3] Out: 650 [Urine:650] Intake/Output this shift: No intake/output data recorded.  Lab Results: CBC Recent Labs    07/29/19 0630  WBC 3.5*  HGB 9.6*  HCT 30.9*  MCV 104.0*  PLT 236   BMET Recent Labs  07/29/19 0630 07/29/19 1244 07/31/19 0454  NA 139  --  138  K 4.3  --  4.1  CL 108  --  110  CO2 27  --  22  GLUCOSE 137*  --  88  BUN 17  --  23  CREATININE 1.51*   --  1.36*  CALCIUM 10.2 10.1 9.2   LFT Recent Labs    07/29/19 0630 07/31/19 0454  BILITOT  --  0.3  ALKPHOS  --  57  AST  --  34  ALT  --  38  PROT  --  6.1*  ALBUMIN 2.7* 2.6*    Lipase No results for input(s): LIPASE in the last 72 hours.  PT/INR Recent Labs    07/29/19 0630  LABPROT 15.1  INR 1.2      Imaging Studies: Ct Head Wo Contrast  Result Date: 07/24/2019 CLINICAL DATA:  Trauma. EXAM: CT HEAD WITHOUT CONTRAST CT CERVICAL SPINE WITHOUT CONTRAST TECHNIQUE: Multidetector CT imaging of the head and cervical spine was performed following the standard protocol without intravenous contrast. Multiplanar CT image reconstructions of the cervical spine were also generated. COMPARISON:  CT scan of November 16, 2016. FINDINGS: CT HEAD FINDINGS Brain: Mild chronic ischemic white matter disease is noted. No mass effect or midline shift is noted. Ventricular size is within normal limits. There is no evidence of mass lesion, hemorrhage or acute infarction. Vascular: No hyperdense vessel or unexpected calcification. Skull: Normal. Negative for fracture or focal lesion. Sinuses/Orbits: Right frontal and ethmoid mucosal thickening is noted. Other: None. CT CERVICAL SPINE FINDINGS Alignment: Normal. Skull base and vertebrae: No acute fracture. No primary bone lesion or focal pathologic process. Soft tissues and spinal canal: No prevertebral fluid or swelling. No visible canal hematoma. Disc levels: Status post surgical anterior fusion of C5-6. Mild anterior osteophyte formation is also noted at C4-5 and C6-7. Upper chest: Negative. Other: None. IMPRESSION: Mild chronic ischemic white matter disease. No acute intracranial abnormality seen. Postsurgical and degenerative changes are noted in the cervical spine. No fracture or significant spondylolisthesis is noted. Electronically Signed   By: Marijo Conception M.D.   On: 07/24/2019 16:20   Ct Chest W Contrast  Result Date: 07/25/2019 CLINICAL  DATA:  Abdominal lymphadenopathy, evaluate chest, history of recent falls and weakness EXAM: CT CHEST WITH CONTRAST TECHNIQUE: Multidetector CT imaging of the chest was performed during intravenous contrast administration. CONTRAST:  17mL OMNIPAQUE IOHEXOL 300 MG/ML  SOLN COMPARISON:  CT abdomen pelvis, 07/24/2019, CT chest, 09/20/2009 FINDINGS: Cardiovascular: Aortic atherosclerosis. Normal heart size. Extensive 3 vessel coronary artery calcifications. No pericardial effusion. Mediastinum/Nodes: There is an enlarged paraesophageal lymph node measuring 2.0 x 1.4 cm at the level of T6-T7 (series 2, image 71). There are prominent periaortic and retrocrural nodes lower in the thorax (series 2, image 135, 153). There are enlarged left lower cervical/superior mediastinal lymph nodes measuring up to 1.2 x 1.0 cm (series 2, image 7). No other enlarged mediastinal, hilar, or axillary lymph nodes. Thyroid gland, trachea, and esophagus demonstrate no significant findings. Lungs/Pleura: Lungs are clear. No pleural effusion or pneumothorax. Upper Abdomen: No acute abnormality. Bulky retroperitoneal lymphadenopathy, better assessed on prior CT of the abdomen pelvis. Musculoskeletal: No chest wall mass or suspicious bone lesions identified. IMPRESSION: 1. There is an enlarged paraesophageal lymph node measuring 2.0 x 1.4 cm at the level of T6-T7 (series 2, image 71). There are prominent periaortic and retrocrural nodes lower in the thorax (series 2, image 135, 153). 2. There  are enlarged left lower cervical/superior mediastinal lymph nodes measuring up to 1.2 x 1.0 cm (series 2, image 7). 3.  Coronary artery disease.  Aortic Atherosclerosis (ICD10-I70.0). Electronically Signed   By: Eddie Candle M.D.   On: 07/25/2019 14:51   Ct Cervical Spine Wo Contrast  Result Date: 07/24/2019 CLINICAL DATA:  Trauma. EXAM: CT HEAD WITHOUT CONTRAST CT CERVICAL SPINE WITHOUT CONTRAST TECHNIQUE: Multidetector CT imaging of the head and  cervical spine was performed following the standard protocol without intravenous contrast. Multiplanar CT image reconstructions of the cervical spine were also generated. COMPARISON:  CT scan of November 16, 2016. FINDINGS: CT HEAD FINDINGS Brain: Mild chronic ischemic white matter disease is noted. No mass effect or midline shift is noted. Ventricular size is within normal limits. There is no evidence of mass lesion, hemorrhage or acute infarction. Vascular: No hyperdense vessel or unexpected calcification. Skull: Normal. Negative for fracture or focal lesion. Sinuses/Orbits: Right frontal and ethmoid mucosal thickening is noted. Other: None. CT CERVICAL SPINE FINDINGS Alignment: Normal. Skull base and vertebrae: No acute fracture. No primary bone lesion or focal pathologic process. Soft tissues and spinal canal: No prevertebral fluid or swelling. No visible canal hematoma. Disc levels: Status post surgical anterior fusion of C5-6. Mild anterior osteophyte formation is also noted at C4-5 and C6-7. Upper chest: Negative. Other: None. IMPRESSION: Mild chronic ischemic white matter disease. No acute intracranial abnormality seen. Postsurgical and degenerative changes are noted in the cervical spine. No fracture or significant spondylolisthesis is noted. Electronically Signed   By: Marijo Conception M.D.   On: 07/24/2019 16:20   Mr Thoracic Spine Wo Contrast  Result Date: 07/30/2019 CLINICAL DATA:  Suspected lymphoma.  Back pain and leg weakness. EXAM: MRI THORACIC SPINE WITHOUT CONTRAST TECHNIQUE: Multiplanar, multisequence MR imaging of the thoracic spine was performed. No intravenous contrast was administered. COMPARISON:  MRI 11/17/2016 FINDINGS: Alignment:  Normal Vertebrae: Normal.  No fracture or marrow space abnormality. Cord:  Normal.  No cord compression or primary cord lesion. Paraspinal and other soft tissues: Paraesophageal node as shown by CT. Tiny amount of pleural fluid. Disc levels: No significant  disc disease in the thoracic region. No disc herniation or stenosis of the canal or foramina. No significant facet arthropathy. IMPRESSION: No significant thoracic spinal abnormality. No significant degenerative disease, stenosis or nerve compression. No evidence of fracture or bone marrow lesion. Paraesophageal lymph node as shown by CT. Electronically Signed   By: Nelson Chimes M.D.   On: 07/30/2019 15:15   Mr Lumbar Spine Wo Contrast  Result Date: 07/30/2019 CLINICAL DATA:  Generalized weakness and back pain. Frequent falling. EXAM: MRI LUMBAR SPINE WITHOUT CONTRAST TECHNIQUE: Multiplanar, multisequence MR imaging of the lumbar spine was performed. No intravenous contrast was administered. COMPARISON:  CT 07/24/2019.  MRI 11/17/2016 FINDINGS: Segmentation:  5 lumbar type vertebral bodies. Alignment:  Normal Vertebrae:  No fracture or primary bone lesion. Conus medullaris and cauda equina: Conus extends to the L1 level. Conus and cauda equina appear normal. Paraspinal and other soft tissues: Massive retroperitoneal lymphadenopathy as seen on the recent CT. Not present in January of 2018. Disc levels: No significant finding at T12-L1 or L1-2. L2-3: Mild desiccation and bulging of the disc. No compressive stenosis. L3-4: Normal interspace. L4-5: Mild bulging of the disc. No compressive stenosis. Mild facet osteoarthritis. L5-S1: Previous right hemilaminectomy. Right posterolateral disc herniation/scarring with narrowing of the right lateral recess that could compress the right S1 nerve. Similar appearance to the study of 2018.  IMPRESSION: Massive retroperitoneal lymphadenopathy. No evidence of marrow space tumor or fracture. Previous right hemilaminectomy at L5-S1. Right lateral recess stenosis because of protruding disc material and scarring could possibly affect the right S1 nerve. Appearance is unchanged since 2018. Electronically Signed   By: Nelson Chimes M.D.   On: 07/30/2019 15:12   Ct Abdomen Pelvis W  Contrast  Result Date: 07/24/2019 CLINICAL DATA:  Bilateral leg weakness. Patient fell in the bathtub today. EXAM: CT ABDOMEN AND PELVIS WITH CONTRAST TECHNIQUE: Multidetector CT imaging of the abdomen and pelvis was performed using the standard protocol following bolus administration of intravenous contrast. CONTRAST:  30mL OMNIPAQUE IOHEXOL 300 MG/ML SOLN, 23mL OMNIPAQUE IOHEXOL 300 MG/ML SOLN COMPARISON:  06/09/2011 FINDINGS: Lower chest: Minor linear atelectasis or scarring in the anterior lung bases. Lung bases otherwise clear. Heart normal in size. Hepatobiliary: No focal liver abnormality is seen. No gallstones, gallbladder wall thickening, or biliary dilatation. Pancreas: Unremarkable. No pancreatic ductal dilatation or surrounding inflammatory changes. Spleen: Normal in size without focal abnormality. Adrenals/Urinary Tract: No adrenal masses. Kidneys are normal in position. There is bilateral renal cortical thinning. 18 mm low-attenuation mass, posterior midpole the left kidney, consistent with a cyst. No other renal masses. Small nonobstructing stones in both kidneys. No hydronephrosis. Ureters normal in course and in caliber. No ureteral stones. Bladder moderately distended. There are dependent stones with some mild increased attenuation in the dependent bladder is well, likely debris. No wall thickening or convincing mass. Stomach/Bowel: Stomach is unremarkable. Small bowel and colon are normal in caliber. No wall thickening. No inflammation. Mild increased colonic stool burden. No evidence of appendicitis. Appendix not visualized. Vascular/Lymphatic: There is extensive adenopathy. Bulky retroperitoneal adenopathy is noted extending from level of the adrenal glands into the pelvis. Node at the level of the midpole the left kidney, left periaortic, measures 4.3 cm in short axis. Left periaortic node at the level of the bifurcation measures 4.3 cm short axis. There are bulky left pelvic lymph nodes.  Posteroinferior pelvic node, to the left of the lower sigmoid colon, measures 7.4 x 4.6 cm transversely. Adjacent left external iliac chain node measures 7.2 x 3.5 cm adenopathy surrounds the common iliac veins, greater on the left, as well as the left external iliac vein. It is not defined and may be occluded. No mesenteric adenopathy. No enlarged inguinal lymph nodes. Dense aortic atherosclerosis.  No aneurysm. Reproductive: Prostate is enlarged measuring 5.9 x 5.5 x 5.6 cm. Other: No abdominal wall hernia or abnormality. No abdominopelvic ascites. Musculoskeletal: No fracture or acute finding. No osteoblastic or osteolytic lesions. IMPRESSION: 1. There is bulky retroperitoneal and left pelvic lymphadenopathy that has developed since the prior CT. Adenopathy may occlude the left common and/or external iliac vein. Differential diagnosis includes lymphoma and other lymphoproliferative disorders. Consider the possibility of prostate carcinoma metastatic to lymph nodes. 2. No acute findings within the abdomen or pelvis. 3. Bilateral intrarenal stones. Dependent bladder stones as well as a small amount of dependent bladder debris. 4. Bilateral renal cortical thinning. 5. Aortic atherosclerosis. Electronically Signed   By: Lajean Manes M.D.   On: 07/24/2019 20:21   US Renal  Result Date: 07/25/2019 CLINICAL DATA:  Acute kidney injury. EXAM: RENAL / URINARY TRACT ULTRASOUND COMPLETE COMPARISON:  CT abdomen and pelvis 07/24/2019. FINDINGS: Right Kidney: Renal measurements: 10.4 x 5.1 x 4.9 cm = volume: 138 mL . Echogenicity within normal limits. No mass or hydronephrosis visualized. Cortex is thinned. Punctate nonobstructing stone in the lower pole seen on  prior CT noted. Left Kidney: Renal measurements: 11.2 x 5.9 x 5.1 cm = volume: 180 mL. Echogenicity within normal limits. No solid mass or hydronephrosis visualized. Cortex is mildly thinned. Simple 2.1 cm cyst noted. Small nonobstructing stones are seen as on  prior CT. Bladder: Bladder calculi are identified as seen on prior CT. The bladder is distended. IMPRESSION: No acute abnormality.  Negative for hydronephrosis. Bilateral nonobstructing renal stones, more numerous on the left. Distended urinary bladder. Electronically Signed   By: Inge Rise M.D.   On: 07/25/2019 14:48   Ct Biopsy  Result Date: 07/30/2019 INDICATION: 73 year old male with newly diagnosed extensive pelvic and retroperitoneal lymphadenopathy concerning for lymphoma versus metastatic pancreatic cancer. EXAM: CT-guided biopsy retroperitoneal lymph node MEDICATIONS: None. ANESTHESIA/SEDATION: Moderate (conscious) sedation was employed during this procedure. A total of Versed 1 mg and Fentanyl 50 mcg was administered intravenously. Moderate Sedation Time: 10 minutes. The patient's level of consciousness and vital signs were monitored continuously by radiology nursing throughout the procedure under my direct supervision. FLUOROSCOPY TIME:  None COMPLICATIONS: None immediate. PROCEDURE: Informed written consent was obtained from the patient after a thorough discussion of the procedural risks, benefits and alternatives. All questions were addressed. Maximal Sterile Barrier Technique was utilized including caps, mask, sterile gowns, sterile gloves, sterile drape, hand hygiene and skin antiseptic. A timeout was performed prior to the initiation of the procedure. A planning axial CT scan was performed. A suitable left retroperitoneal lymph node was identified. A suitable skin entry site was selected and marked. After sterile prep and drape in the standard fashion with chlorhexidine skin prep, local anesthesia was attained by infiltration with 1% lidocaine. A small dermatotomy was made. Under intermittent CT guidance, a 17 gauge introducer needle was advanced and positioned at the margin of the enlarged lymph node. Multiple 18 gauge core biopsies were then coaxially obtained using the bio Pince  automated biopsy device. Biopsy specimens were placed in saline and delivered to pathology for further evaluation. Post biopsy CT imaging demonstrates no evidence of immediate complication. IMPRESSION: Technically successful CT-guided core biopsy of left retroperitoneal lymphadenopathy. Electronically Signed   By: Jacqulynn Cadet M.D.   On: 07/30/2019 16:09   Dg Abd Acute 2+v W 1v Chest  Result Date: 07/24/2019 CLINICAL DATA:  Fall with hip pain EXAM: DG ABDOMEN ACUTE W/ 1V CHEST COMPARISON:  11/16/2016 chest x-ray FINDINGS: Single-view chest demonstrate surgical hardware in the lower cervical spine. No focal opacity or pleural effusion. Normal heart size. No pneumothorax. Supine and upright views of the abdomen demonstrate no free air beneath the diaphragm. Nonobstructed gas pattern with moderate stool. No radiopaque calculi. Phleboliths in the pelvis IMPRESSION: Negative abdominal radiographs.  No acute cardiopulmonary disease. Electronically Signed   By: Donavan Foil M.D.   On: 07/24/2019 15:37   Dg Hip Unilat W Or Wo Pelvis 2-3 Views Left  Result Date: 07/24/2019 CLINICAL DATA:  Fall with left hip pain EXAM: DG HIP (WITH OR WITHOUT PELVIS) 2-3V LEFT COMPARISON:  11/16/2016, CT 06/09/2011 FINDINGS: SI joints are non widened. Pubic symphysis and rami appear intact. No fracture or malalignment. Mild degenerative changes of the left hip with mild joint space narrowing and osteophytosis at the left femoral head neck junction. IMPRESSION: No acute osseous abnormality Electronically Signed   By: Donavan Foil M.D.   On: 07/24/2019 15:36  [4 week]   Impression: 73 year old gentleman admitted with generalized weakness, falls 1 week ago.  Work-up showing generalized lymphadenopathy, biopsy pending.  Noted to have retroperitoneal  adenopathy, bulky left pelvic adenopathy, enlarged paraesophageal lymph node measuring 2 x 1.4 cm as well as other lymphadenopathy in the left lower cervical/superior mediastinal  area.    Odynophagia began 07/30/2019.  No prior dysphagia or odynophagia.  Overall poor appetite with nausea and avoidance of food for the past 3 to 4 days.  Noted pain in the chest with swallowing orange juice.  No oral pain.  No evidence of thrush.  No reported food or pill impaction.  No typical reflux.  Etiology unclear, unlikely related to paraesophageal lymphadenopathy, esophagitis related to reflux/yeast/pill induced possibility.  Anorexia: Poor oral intake over the past 3 to 4 days.  Patient reporting essentially no solid food and very limited liquids.  Nausea without vomiting.  Likely multifactorial. At risk for being unable to maintain hydration as outpatient.   Plan: 1. Empiric trial of PPI, start pantoprazole 40 mg twice daily before meals. 2. Add Carafate 1 g suspension, 3 times daily before meals and at bedtime. 3. If no significant improvement over the next 24 to 48 hours, consider upper endoscopy.  We would like to thank you for the opportunity to participate in the care of Daniel Richards.  Laureen Ochs. Bernarda Caffey [facility] [phone] 9/24/202010:12 AM     LOS: 6 days

## 2019-07-31 NOTE — Progress Notes (Signed)
Palliative: Mr. Barcelo is resting quietly in bed.  He greets me making and somewhat keeping eye contact.  He appears weak and frail, but is alert and oriented.  There is no family at bedside at this time.  We talked about visits with doctors and specialist.  Mr. Hogle states he is still does not know that the test results from the biopsy.  I share that the results are still pending. We talked about the visit from GI specialist this morning, we discussed the recommendations in detail.  We talked about bowel regimen.  Mr. Kooyman tells me that he usually moves his bowels every 3 days, but it has been at least a week or more since his last bowel movement.  We talked about bowel regimen, and he agrees to try suppository, enema if needed.  He shares that he is having pain, but feels that the pain medication regimen is effective.  I encouraged him to ask for pain medicine as needed.  Conference with bedside nursing related to patient condition, needs, disposition.  Plan: Continues to be agreeable to rehab.  Will follow up with oncology outpatient for details of treatment plan.  3 minutes Quinn Axe, NP Palliative Medicine Team Team Phone # [phone] Greater than 50% of this time was spent counseling and coordinating care related to the above assessment and plan.

## 2019-07-31 NOTE — Progress Notes (Signed)
Nutrition Follow-up  DOCUMENTATION CODES:   Non-severe (moderate) malnutrition in context of chronic illness  INTERVENTION:  -Buttermilk TID, each 237 ml serving provides 100 kcal and 8 grams protein (supplement located in nourishment room with patient name and room number on bag)  -Encourage po intake  -Continue Ensure Enlive po BID, each supplement provides 350 kcal and 20 grams of protein  -Continue Magic cup TID with meals, each supplement provides 290 kcal and 9 grams of protein  -Continue MVI daily  NUTRITION DIAGNOSIS:   Moderate Malnutrition related to chronic illness as evidenced by mild fat depletion, moderate fat depletion, mild muscle depletion, moderate muscle depletion, energy intake < 75% for > or equal to 1 month, per patient/family report.  Ongoing  GOAL:   Patient will meet greater than or equal to 90% of their needs  Unmet; progressing  MONITOR:   I & O's, Labs, Weight trends, Supplement acceptance, PO intake  REASON FOR ASSESSMENT:   Malnutrition Screening Tool    ASSESSMENT:   73 year old male with medical history significant of HTN, HLD, T2DM presented to ED with complaints of weakness, abdominal pain, and recurrent falls  9/23: IR - left para-aortic lymph node biopsy; pathology report pending  Patient reports ongoing poor oral intake as well as new onset substernal pain/pressure when swallowing foods. He stated that he feels like food is getting stuck after swallowing. Patinet examined by GI today; recommending empiric trial of PPI twice daily before meals, Carafate TID. Noted, consideration of EGD if no significant improvements over the next 24-48 hrs.   Patient recalls having a good lunch today, stating he really enjoys sweet potatoes. Unfortunately, patient reports having BM during lunch and lost his appetite after he was unable to get to the bathroom in time. He stated that he feels much better this afternoon; having less abdominal pain and  bloating s/p BM.   Eating 0-50% of last 8 recorded meals. Encouraged po intake of meals and ONS.as well as obtained additional food preferences. Patient reports liking buttermilk; will provide as a nutrition supplement. RD updated RN that supplement was located in the nourishment room with name and room number. Will continue to monitor for po at meal times and intake of ONS.   I/Os: -1784 ml since admit   +1048 ml x 24 hr UOP: 650 ml x 24 hr  Current wt 90.3 kg (198.7 lb)   Admit wt 86.8 kg (191 lb) UBW 220 lb approximately 1 year ago  Medications reviewed and include: folic acid, gabapentin, SSI, MVI, B12, protonix, carafate  Labs: CBGS [id number]  Diet Order:   Diet Order            Diet regular Room service appropriate? Yes; Fluid consistency: Thin  Diet effective now              EDUCATION NEEDS:   No education needs have been identified at this time  Skin:  Skin Assessment: Reviewed RN Assessment  Last BM:  9/24  Height:   Ht Readings from Last 1 Encounters:  07/25/19 6' (1.829 m)    Weight:   Wt Readings from Last 1 Encounters:  07/28/19 90.3 kg    Ideal Body Weight:  80.9 kg  BMI:  Body mass index is 27 kg/m.  Estimated Nutritional Needs:   Kcal:  2050-2220 (MSJ 1.2-1.3)  Protein:  103-111 grams  Fluid:  >2L   Lajuan Lines, RD, LDN Office [phone] After Hours/Weekend Pager: [phone]

## 2019-07-31 NOTE — Progress Notes (Signed)
Patient has not voided since beginning of shift.  Patient I&O cath per MD order and 300 cc or yellow urine returned.

## 2019-08-01 ENCOUNTER — Inpatient Hospital Stay
Admission: RE | Admit: 2019-08-01 | Discharge: 2019-08-08 | Disposition: A | Discharge status: Nursing Home (Includes ICF) | Payer: Medicare Other | Source: Ambulatory Visit | Attending: Internal Medicine | Admitting: Internal Medicine

## 2019-08-01 DIAGNOSIS — E440 Moderate protein-calorie malnutrition: Secondary | ICD-10-CM

## 2019-08-01 DIAGNOSIS — R1084 Generalized abdominal pain: Secondary | ICD-10-CM

## 2019-08-01 DIAGNOSIS — N179 Acute kidney failure, unspecified: Secondary | ICD-10-CM

## 2019-08-01 DIAGNOSIS — R531 Weakness: Secondary | ICD-10-CM

## 2019-08-01 LAB — COMPREHENSIVE METABOLIC PANEL
ALT: 28 U/L (ref 0–44)
AST: 25 U/L (ref 15–41)
Albumin: 2.5 g/dL — ABNORMAL LOW (ref 3.5–5.0)
Alkaline Phosphatase: 60 U/L (ref 38–126)
Anion gap: 6 (ref 5–15)
BUN: 22 mg/dL (ref 8–23)
CO2: 20 mmol/L — ABNORMAL LOW (ref 22–32)
Calcium: 8.4 mg/dL — ABNORMAL LOW (ref 8.9–10.3)
Chloride: 111 mmol/L (ref 98–111)
Creatinine, Ser: 1.32 mg/dL — ABNORMAL HIGH (ref 0.61–1.24)
GFR calc Af Amer: >60 mL/min (ref >60)
GFR calc non Af Amer: 52 mL/min — ABNORMAL LOW (ref >60)
Glucose, Bld: 110 mg/dL — ABNORMAL HIGH (ref 70–99)
Potassium: 3.7 mmol/L (ref 3.5–5.1)
Sodium: 137 mmol/L (ref 135–145)
Total Bilirubin: 0.1 mg/dL — ABNORMAL LOW (ref 0.3–1.2)
Total Protein: 5.9 g/dL — ABNORMAL LOW (ref 6.5–8.1)

## 2019-08-01 LAB — GLUCOSE, CAPILLARY: Glucose-Capillary: 102 mg/dL — ABNORMAL HIGH (ref 70–99)

## 2019-08-01 LAB — SURGICAL PATHOLOGY

## 2019-08-01 MED ORDER — CYANOCOBALAMIN 1000 MCG PO TABS: 1000.0000 ug | ORAL_TABLET | Freq: Every day | ORAL | Status: DC

## 2019-08-01 MED ORDER — FOLIC ACID 1 MG PO TABS: 1.0000 mg | ORAL_TABLET | Freq: Every day | ORAL | Status: DC

## 2019-08-01 MED ORDER — SUCRALFATE 1 GM/10ML PO SUSP: 1.0000 g | Freq: Three times a day (TID) | ORAL | 0 refills | Status: DC

## 2019-08-01 MED ORDER — PANTOPRAZOLE SODIUM 40 MG PO TBEC: 40.0000 mg | DELAYED_RELEASE_TABLET | Freq: Two times a day (BID) | ORAL | Status: DC

## 2019-08-01 MED ORDER — ENSURE ENLIVE PO LIQD: 237.0000 mL | Freq: Two times a day (BID) | ORAL | 12 refills | Status: DC

## 2019-08-01 MED ORDER — AMLODIPINE BESYLATE 5 MG PO TABS: 5.0000 mg | ORAL_TABLET | Freq: Every day | ORAL | Status: DC

## 2019-08-01 MED ORDER — HYDROCODONE-ACETAMINOPHEN 10-325 MG PO TABS: 1.0000 | ORAL_TABLET | ORAL | 0 refills | Status: DC | PRN

## 2019-08-01 MED ORDER — HYDROMORPHONE HCL 1 MG/ML IJ SOLN
0.5000 mg | Freq: Once | INTRAMUSCULAR | Status: AC
  Administered 2019-08-01: 0.5 mg via INTRAVENOUS
  Filled 2019-08-01: qty 0.5

## 2019-08-01 NOTE — Care Management Important Message (Signed)
Important Message  Patient Details  Name: Daniel Richards MRN: [id number] Date of Birth: 1946-01-03   Medicare Important Message Given:  Yes(given at 12:30pm)     [name] 08/01/2019, 1:41 PM

## 2019-08-01 NOTE — Progress Notes (Signed)
Physical Therapy Treatment Patient Details Name: Daniel Richards MRN: [id number] DOB: 1946-08-26 Today's Date: 08/01/2019    History of Present Illness Daniel Richards  is a 73 y.o. male, with history of hypertension, hyperlipidemia, diabetes mellitus type 2, tremor came to ED with chief complaints of falls at home.  Patient says that he has been feeling more weak and is having recurrent falls at home.  Also complains of abdominal pain.He denies passing out today.  But says that he has passed out in the past.    PT Comments    Patient presents supine in bed and agreeable to therapy. Pt limited with ambulation today secondary to decreased endurance and laxative medication this morning. Pt tolerates ambulation around room, to door, then returning to bathroom using RW, demonstrating fair gait pattern requiring education on maintaining body within RW frame and avoiding reaching for furniture. Pt requires increased time with 180 degree turns and verbal cues safety in tight spaces when navigating room and bathroom. Pt tolerates seated BLE strengthening exercises with demonstration and verbal cues to improve performance and controlled movement. Pt continues to require min guard to min assist with mobility due to occasional unsteadiness and cues for safety management with RW. Pt tolerates remaining up in chair at EOS with call bell in hand and therapist assisted pt in calling daughter on hospital phone. Patient will benefit from continued physical therapy in hospital and recommended venue below to increase strength, balance, endurance for safe ADLs and gait.     Follow Up Recommendations  SNF     Equipment Recommendations  None recommended by PT    Recommendations for Other Services       Precautions / Restrictions Precautions Precautions: Fall Restrictions Weight Bearing Restrictions: No    Mobility  Bed Mobility Overal bed mobility: Needs Assistance Bed Mobility: Supine to Sit     Supine to  sit: Min guard;HOB elevated     General bed mobility comments: use of bedrail and elevated HOB to upright trunk, increased time  Transfers Overall transfer level: Needs assistance Equipment used: Rolling walker (2 wheeled) Transfers: Sit to/from [facility] Sit to Stand: Min guard;Min assist Stand pivot transfers: Min guard       General transfer comment: rocking momentum to rise from seated surface, min assist with lower surfaces  Ambulation/Gait Ambulation/Gait assistance: Min assist;Min guard Gait Distance (Feet): 25 Feet Assistive device: Rolling walker (2 wheeled) Gait Pattern/deviations: Decreased step length - right;Decreased step length - left;Decreased stride length Gait velocity: decreased   General Gait Details: no loss of balance, increased time with 180 degree turns, education to maintain body within RW frame and avoid reaching for furniture to decrease risk for falls, limited by fatigue   Stairs             Wheelchair Mobility    Modified Rankin (Stroke Patients Only)       Balance Overall balance assessment: Needs assistance Sitting-balance support: Feet supported;No upper extremity supported Sitting balance-Leahy Scale: Good Sitting balance - Comments: seated at bedside   Standing balance support: During functional activity;Bilateral upper extremity supported Standing balance-Leahy Scale: Fair Standing balance comment: with RW                            Cognition Arousal/Alertness: Awake/alert Behavior During Therapy: WFL for tasks assessed/performed Overall Cognitive Status: Within Functional Limits for tasks assessed  Exercises General Exercises - Lower Extremity Long Arc Quad: Seated;AROM;Strengthening;Both;15 reps Hip Flexion/Marching: Seated;AROM;Strengthening;Both;15 reps Toe Raises: Seated;AROM;Strengthening;Both;15 reps Heel Raises:  Seated;AROM;Strengthening;Both;15 reps    General Comments        Pertinent Vitals/Pain Pain Assessment: 0-10 Pain Score: 3  Pain Location: LBP Pain Descriptors / Indicators: Sore;Aching Pain Intervention(s): Limited activity within patient's tolerance    Home Living                      Prior Function            PT Goals (current goals can now be found in the care plan section) Acute Rehab PT Goals Patient Stated Goal: return home after rehab PT Goal Formulation: With patient Time For Goal Achievement: 08/08/19 Potential to Achieve Goals: Good Progress towards PT goals: Progressing toward goals    Frequency    Min 3X/week      PT Plan Current plan remains appropriate    Co-evaluation              AM-PAC PT "6 Clicks" Mobility   Outcome Measure  Help needed turning from your back to your side while in a flat bed without using bedrails?: None Help needed moving from lying on your back to sitting on the side of a flat bed without using bedrails?: A Little Help needed moving to and from a bed to a chair (including a wheelchair)?: A Little Help needed standing up from a chair using your arms (e.g., wheelchair or bedside chair)?: A Little Help needed to walk in hospital room?: A Little Help needed climbing 3-5 steps with a railing? : A Lot 6 Click Score: 18    End of Session Equipment Utilized During Treatment: Gait belt Activity Tolerance: Patient tolerated treatment well;Patient limited by fatigue Patient left: in chair;with call bell/phone within reach Nurse Communication: Mobility status PT Visit Diagnosis: Other abnormalities of gait and mobility (R26.89);Unsteadiness on feet (R26.81);Muscle weakness (generalized) (M62.81)     Time: [id number] PT Time Calculation (min) (ACUTE ONLY): 28 min  Charges:  $Gait Training: 8-22 mins $Therapeutic Exercise: 8-22 mins                      Tori Naina Sleeper PT, DPT 08/01/19, 10:52  AM [phone]

## 2019-08-01 NOTE — Discharge Summary (Signed)
Physician Discharge Summary  TATE BUCKALEW [id number] DOB: [birth year] DOA: 07/24/2019  PCP: Scotty Court, DO  Admit date: 07/24/2019 Discharge date: 08/01/2019  Admitted From: Home Disposition:  Home   Recommendations for Outpatient Follow-up:  1. Follow up with PCP in 1-2 weeks 2. Please obtain BMP/CBC in one week 3. Please schedule follow up appointment with oncology, Dr. Delton Coombes to follow up on lymph node biopsy 4. Please schedule appointment for patient to follow up with urology in 2-3 weeks for urinary retention failing voiding trial on 07/31/19   Discharge Condition: Stable CODE STATUS: DNR Diet recommendation: Regular   Brief/Interim Summary: 73 year old male with a history of hypertension, hyperlipidemia, diabetes mellitus type 2, essential tremor presenting with frequent falls, lower abdominal pain, generalized weakness and back pain.He complained of weakness which is generalized and slightly more weakness in the legs for the past 1 to 2 months.The patient was found to have a corrected calcium of 11.36 at the time of admission. Patient lives with his son at home. He was found to have elevated calcium of 10.6. A CT of the abdomen and pelvis with contrast on 07/24/2019 showed bulky retroperitoneal and left pelvic lymphadenopathy which is new since CT scan from 2012. Prostate was also enlarged. An LDH level was normal at 107. He denied any fevers, night sweats or significant weight loss in the last 6 months.  A CT scan of the chest done on 07/25/2019 showed enlarged paraesophageal lymph node measuring 2 x 1.4 cm at the level of T6-T7. Prominent periaortic and retrocrural lymph nodes. Enlarged left lower cervical/superior mediastinal lymph nodes measuring 1.2 x 1.0 cm. Patient is an ex-smoker, quit at age 21. He smoked up to 3 packs/day for 20 years.  Discharge Diagnoses:  Hypercalcemia -concerned about underlying malignancy -iPTH = 14 -rPTH--pending -25  vitamin D--pending at time of d/c -1,25 vitamin D--pending at time of d/c -received zometa x 1 on 07/28/19 -finished 48 hours of miacalcin 07/30/19 -increased IVF  -9/25 corrected calcium 11.26>>10.32>>>9.60  Retroperitoneal and mediastinal lymphadenopathy -07/30/2019--percutaneous biopsy retroperitoneal lymph nodes by interventional radiology-pathology pending -Appreciate medical oncology consult-->planning for outpatient PET scan -pathology remains pending at time of d/c  Odynophagia -appreciate GI consult-->protonix bid with carafate empirically -no plan for inpatient endoscopy-->reconsider if no improvement with protonix/carafate  Back pain and leg weakness -MRI thoracic and lumbar spine--no metastasis, impingement, myelopathy -May be partly due to 123456 and folic acid deficiency -Serum 123456 0000000 -Serum folic acid 5.0 -Supplementing both 123456 and folic acid  Acute on chronic renal failure--CKD stage III -Baseline creatinine 1.0-1.2 -Presented with serum creatinine 1.64 -Secondary to volume depletion -Increased IV fluids -serum creatinine 1.32 at time of d/c  Diabetes mellitus type 2, controlled -9/17/20hemoglobin A1c 5.6 -Discontinue sliding scale insulin -Allow for liberal glycemic control at this point  Urinary retention -Foley catheter was placed at the time of admission -Discontinue Foley catheter for voiding trial 9/23 -unable to void on own-->replace foley 9/24 -Continue Flomax 0.4 mg daily -outpt urology followup  Essential hypertension -Continue amlodipine -Continue Inderal which is also being used for his essential tremor  Essential tremor -Continue Mysoline and Inderal   Goals of Care Advance care planning, including the explanation and discussion of advance directives was carried out with the patient and family.  Code status including explanations of "Full Code" and "DNR" and alternatives were discussed in detail.  Discussion of end-of-life issues  including but not limited palliative care, hospice care and the concept of hospice, other end-of-life care options,  power of attorney for health care decisions, living wills, and physician orders for life-sustaining treatment were also discussed with the patient and family.  Total face to face time 16 minutes. -pt is DNR  Discharge Instructions   Allergies as of 08/01/2019   No Known Allergies     Medication List    STOP taking these medications   lisinopril 20 MG tablet Commonly known as: ZESTRIL     TAKE these medications   amLODipine 5 MG tablet Commonly known as: NORVASC Take 1 tablet (5 mg total) by mouth daily. Start taking on: August 02, 2019   calcitRIOL 0.5 MCG capsule Commonly known as: ROCALTROL Take 1 capsule by mouth daily.   citalopram 20 MG tablet Commonly known as: CELEXA Take 20 mg by mouth at bedtime.   cyanocobalamin 1000 MCG tablet Take 1 tablet (1,000 mcg total) by mouth daily. Start taking on: August 02, 2019   feeding supplement (ENSURE ENLIVE) Liqd Take 237 mLs by mouth 2 (two) times daily between meals.   folic acid 1 MG tablet Commonly known as: FOLVITE Take 1 tablet (1 mg total) by mouth daily. Start taking on: August 02, 2019   gabapentin 300 MG capsule Commonly known as: NEURONTIN Take 600 mg by mouth 3 (three) times daily.   HYDROcodone-acetaminophen 10-325 MG tablet Commonly known as: NORCO Take 1 tablet by mouth every 4 (four) hours as needed for severe pain. What changed:   when to take this  reasons to take this   Invokana 100 MG Tabs tablet Generic drug: canagliflozin Take 1 tablet by mouth daily.   metFORMIN 500 MG tablet Commonly known as: GLUCOPHAGE Take 1 tablet by mouth daily.   pantoprazole 40 MG tablet Commonly known as: PROTONIX Take 1 tablet (40 mg total) by mouth 2 (two) times daily before a meal.   primidone 50 MG tablet Commonly known as: MYSOLINE Take 1.5 tablets (75 mg total) by mouth every  8 (eight) hours. What changed: how much to take   propranolol 40 MG tablet Commonly known as: INDERAL Take 40 mg by mouth 2 (two) times daily.   sucralfate 1 GM/10ML suspension Commonly known as: CARAFATE Take 10 mLs (1 g total) by mouth 4 (four) times daily -  with meals and at bedtime.   tamsulosin 0.4 MG Caps capsule Commonly known as: FLOMAX Take 1 capsule by mouth daily.   topiramate 100 MG tablet Commonly known as: TOPAMAX Take 200 mg by mouth at bedtime.       Contact information for follow-up providers    Derek Jack, MD. Schedule an appointment as soon as possible for a visit in 2 week(s).   Specialty: Hematology Why: Follow Up for lymphoma Workup and treatments Contact information: [place] [address] [phone]            Contact information for after-discharge care    [facility] .   Service: Skilled Nursing Contact information: [address] [place] [phone]                 No Known Allergies  Consultations:  Med onc  Palliative medicine   Procedures/Studies: Ct Head Wo Contrast  Result Date: 07/24/2019 CLINICAL DATA:  Trauma. EXAM: CT HEAD WITHOUT CONTRAST CT CERVICAL SPINE WITHOUT CONTRAST TECHNIQUE: Multidetector CT imaging of the head and cervical spine was performed following the standard protocol without intravenous contrast. Multiplanar CT image reconstructions of the cervical  spine were also generated. COMPARISON:  CT scan of November 16, 2016. FINDINGS: CT HEAD FINDINGS Brain: Mild chronic ischemic white matter disease is noted. No mass effect or midline shift is noted. Ventricular size is within normal limits. There is no evidence of mass lesion, hemorrhage or acute infarction. Vascular: No hyperdense vessel or unexpected calcification. Skull: Normal. Negative for fracture or focal lesion. Sinuses/Orbits: Right frontal and ethmoid  mucosal thickening is noted. Other: None. CT CERVICAL SPINE FINDINGS Alignment: Normal. Skull base and vertebrae: No acute fracture. No primary bone lesion or focal pathologic process. Soft tissues and spinal canal: No prevertebral fluid or swelling. No visible canal hematoma. Disc levels: Status post surgical anterior fusion of C5-6. Mild anterior osteophyte formation is also noted at C4-5 and C6-7. Upper chest: Negative. Other: None. IMPRESSION: Mild chronic ischemic white matter disease. No acute intracranial abnormality seen. Postsurgical and degenerative changes are noted in the cervical spine. No fracture or significant spondylolisthesis is noted. Electronically Signed   By: Marijo Conception M.D.   On: 07/24/2019 16:20   Ct Chest W Contrast  Result Date: 07/25/2019 CLINICAL DATA:  Abdominal lymphadenopathy, evaluate chest, history of recent falls and weakness EXAM: CT CHEST WITH CONTRAST TECHNIQUE: Multidetector CT imaging of the chest was performed during intravenous contrast administration. CONTRAST:  8mL OMNIPAQUE IOHEXOL 300 MG/ML  SOLN COMPARISON:  CT abdomen pelvis, 07/24/2019, CT chest, 09/20/2009 FINDINGS: Cardiovascular: Aortic atherosclerosis. Normal heart size. Extensive 3 vessel coronary artery calcifications. No pericardial effusion. Mediastinum/Nodes: There is an enlarged paraesophageal lymph node measuring 2.0 x 1.4 cm at the level of T6-T7 (series 2, image 71). There are prominent periaortic and retrocrural nodes lower in the thorax (series 2, image 135, 153). There are enlarged left lower cervical/superior mediastinal lymph nodes measuring up to 1.2 x 1.0 cm (series 2, image 7). No other enlarged mediastinal, hilar, or axillary lymph nodes. Thyroid gland, trachea, and esophagus demonstrate no significant findings. Lungs/Pleura: Lungs are clear. No pleural effusion or pneumothorax. Upper Abdomen: No acute abnormality. Bulky retroperitoneal lymphadenopathy, better assessed on prior CT of the  abdomen pelvis. Musculoskeletal: No chest wall mass or suspicious bone lesions identified. IMPRESSION: 1. There is an enlarged paraesophageal lymph node measuring 2.0 x 1.4 cm at the level of T6-T7 (series 2, image 71). There are prominent periaortic and retrocrural nodes lower in the thorax (series 2, image 135, 153). 2. There are enlarged left lower cervical/superior mediastinal lymph nodes measuring up to 1.2 x 1.0 cm (series 2, image 7). 3.  Coronary artery disease.  Aortic Atherosclerosis (ICD10-I70.0). Electronically Signed   By: Eddie Candle M.D.   On: 07/25/2019 14:51   Ct Cervical Spine Wo Contrast  Result Date: 07/24/2019 CLINICAL DATA:  Trauma. EXAM: CT HEAD WITHOUT CONTRAST CT CERVICAL SPINE WITHOUT CONTRAST TECHNIQUE: Multidetector CT imaging of the head and cervical spine was performed following the standard protocol without intravenous contrast. Multiplanar CT image reconstructions of the cervical spine were also generated. COMPARISON:  CT scan of November 16, 2016. FINDINGS: CT HEAD FINDINGS Brain: Mild chronic ischemic white matter disease is noted. No mass effect or midline shift is noted. Ventricular size is within normal limits. There is no evidence of mass lesion, hemorrhage or acute infarction. Vascular: No hyperdense vessel or unexpected calcification. Skull: Normal. Negative for fracture or focal lesion. Sinuses/Orbits: Right frontal and ethmoid mucosal thickening is noted. Other: None. CT CERVICAL SPINE FINDINGS Alignment: Normal. Skull base and vertebrae: No acute fracture. No primary bone lesion or focal pathologic process.  Soft tissues and spinal canal: No prevertebral fluid or swelling. No visible canal hematoma. Disc levels: Status post surgical anterior fusion of C5-6. Mild anterior osteophyte formation is also noted at C4-5 and C6-7. Upper chest: Negative. Other: None. IMPRESSION: Mild chronic ischemic white matter disease. No acute intracranial abnormality seen. Postsurgical and  degenerative changes are noted in the cervical spine. No fracture or significant spondylolisthesis is noted. Electronically Signed   By: Marijo Conception M.D.   On: 07/24/2019 16:20   Mr Thoracic Spine Wo Contrast  Result Date: 07/30/2019 CLINICAL DATA:  Suspected lymphoma.  Back pain and leg weakness. EXAM: MRI THORACIC SPINE WITHOUT CONTRAST TECHNIQUE: Multiplanar, multisequence MR imaging of the thoracic spine was performed. No intravenous contrast was administered. COMPARISON:  MRI 11/17/2016 FINDINGS: Alignment:  Normal Vertebrae: Normal.  No fracture or marrow space abnormality. Cord:  Normal.  No cord compression or primary cord lesion. Paraspinal and other soft tissues: Paraesophageal node as shown by CT. Tiny amount of pleural fluid. Disc levels: No significant disc disease in the thoracic region. No disc herniation or stenosis of the canal or foramina. No significant facet arthropathy. IMPRESSION: No significant thoracic spinal abnormality. No significant degenerative disease, stenosis or nerve compression. No evidence of fracture or bone marrow lesion. Paraesophageal lymph node as shown by CT. Electronically Signed   By: Nelson Chimes M.D.   On: 07/30/2019 15:15   Mr Lumbar Spine Wo Contrast  Result Date: 07/30/2019 CLINICAL DATA:  Generalized weakness and back pain. Frequent falling. EXAM: MRI LUMBAR SPINE WITHOUT CONTRAST TECHNIQUE: Multiplanar, multisequence MR imaging of the lumbar spine was performed. No intravenous contrast was administered. COMPARISON:  CT 07/24/2019.  MRI 11/17/2016 FINDINGS: Segmentation:  5 lumbar type vertebral bodies. Alignment:  Normal Vertebrae:  No fracture or primary bone lesion. Conus medullaris and cauda equina: Conus extends to the L1 level. Conus and cauda equina appear normal. Paraspinal and other soft tissues: Massive retroperitoneal lymphadenopathy as seen on the recent CT. Not present in January of 2018. Disc levels: No significant finding at T12-L1 or L1-2.  L2-3: Mild desiccation and bulging of the disc. No compressive stenosis. L3-4: Normal interspace. L4-5: Mild bulging of the disc. No compressive stenosis. Mild facet osteoarthritis. L5-S1: Previous right hemilaminectomy. Right posterolateral disc herniation/scarring with narrowing of the right lateral recess that could compress the right S1 nerve. Similar appearance to the study of 2018. IMPRESSION: Massive retroperitoneal lymphadenopathy. No evidence of marrow space tumor or fracture. Previous right hemilaminectomy at L5-S1. Right lateral recess stenosis because of protruding disc material and scarring could possibly affect the right S1 nerve. Appearance is unchanged since 2018. Electronically Signed   By: Nelson Chimes M.D.   On: 07/30/2019 15:12   Ct Abdomen Pelvis W Contrast  Result Date: 07/24/2019 CLINICAL DATA:  Bilateral leg weakness. Patient fell in the bathtub today. EXAM: CT ABDOMEN AND PELVIS WITH CONTRAST TECHNIQUE: Multidetector CT imaging of the abdomen and pelvis was performed using the standard protocol following bolus administration of intravenous contrast. CONTRAST:  70mL OMNIPAQUE IOHEXOL 300 MG/ML SOLN, 72mL OMNIPAQUE IOHEXOL 300 MG/ML SOLN COMPARISON:  06/09/2011 FINDINGS: Lower chest: Minor linear atelectasis or scarring in the anterior lung bases. Lung bases otherwise clear. Heart normal in size. Hepatobiliary: No focal liver abnormality is seen. No gallstones, gallbladder wall thickening, or biliary dilatation. Pancreas: Unremarkable. No pancreatic ductal dilatation or surrounding inflammatory changes. Spleen: Normal in size without focal abnormality. Adrenals/Urinary Tract: No adrenal masses. Kidneys are normal in position. There is bilateral renal cortical  thinning. 18 mm low-attenuation mass, posterior midpole the left kidney, consistent with a cyst. No other renal masses. Small nonobstructing stones in both kidneys. No hydronephrosis. Ureters normal in course and in caliber. No  ureteral stones. Bladder moderately distended. There are dependent stones with some mild increased attenuation in the dependent bladder is well, likely debris. No wall thickening or convincing mass. Stomach/Bowel: Stomach is unremarkable. Small bowel and colon are normal in caliber. No wall thickening. No inflammation. Mild increased colonic stool burden. No evidence of appendicitis. Appendix not visualized. Vascular/Lymphatic: There is extensive adenopathy. Bulky retroperitoneal adenopathy is noted extending from level of the adrenal glands into the pelvis. Node at the level of the midpole the left kidney, left periaortic, measures 4.3 cm in short axis. Left periaortic node at the level of the bifurcation measures 4.3 cm short axis. There are bulky left pelvic lymph nodes. Posteroinferior pelvic node, to the left of the lower sigmoid colon, measures 7.4 x 4.6 cm transversely. Adjacent left external iliac chain node measures 7.2 x 3.5 cm adenopathy surrounds the common iliac veins, greater on the left, as well as the left external iliac vein. It is not defined and may be occluded. No mesenteric adenopathy. No enlarged inguinal lymph nodes. Dense aortic atherosclerosis.  No aneurysm. Reproductive: Prostate is enlarged measuring 5.9 x 5.5 x 5.6 cm. Other: No abdominal wall hernia or abnormality. No abdominopelvic ascites. Musculoskeletal: No fracture or acute finding. No osteoblastic or osteolytic lesions. IMPRESSION: 1. There is bulky retroperitoneal and left pelvic lymphadenopathy that has developed since the prior CT. Adenopathy may occlude the left common and/or external iliac vein. Differential diagnosis includes lymphoma and other lymphoproliferative disorders. Consider the possibility of prostate carcinoma metastatic to lymph nodes. 2. No acute findings within the abdomen or pelvis. 3. Bilateral intrarenal stones. Dependent bladder stones as well as a small amount of dependent bladder debris. 4. Bilateral renal  cortical thinning. 5. Aortic atherosclerosis. Electronically Signed   By: Lajean Manes M.D.   On: 07/24/2019 20:21   US Renal  Result Date: 07/25/2019 CLINICAL DATA:  Acute kidney injury. EXAM: RENAL / URINARY TRACT ULTRASOUND COMPLETE COMPARISON:  CT abdomen and pelvis 07/24/2019. FINDINGS: Right Kidney: Renal measurements: 10.4 x 5.1 x 4.9 cm = volume: 138 mL . Echogenicity within normal limits. No mass or hydronephrosis visualized. Cortex is thinned. Punctate nonobstructing stone in the lower pole seen on prior CT noted. Left Kidney: Renal measurements: 11.2 x 5.9 x 5.1 cm = volume: 180 mL. Echogenicity within normal limits. No solid mass or hydronephrosis visualized. Cortex is mildly thinned. Simple 2.1 cm cyst noted. Small nonobstructing stones are seen as on prior CT. Bladder: Bladder calculi are identified as seen on prior CT. The bladder is distended. IMPRESSION: No acute abnormality.  Negative for hydronephrosis. Bilateral nonobstructing renal stones, more numerous on the left. Distended urinary bladder. Electronically Signed   By: Inge Rise M.D.   On: 07/25/2019 14:48   Ct Biopsy  Result Date: 07/30/2019 INDICATION: 73 year old male with newly diagnosed extensive pelvic and retroperitoneal lymphadenopathy concerning for lymphoma versus metastatic pancreatic cancer. EXAM: CT-guided biopsy retroperitoneal lymph node MEDICATIONS: None. ANESTHESIA/SEDATION: Moderate (conscious) sedation was employed during this procedure. A total of Versed 1 mg and Fentanyl 50 mcg was administered intravenously. Moderate Sedation Time: 10 minutes. The patient's level of consciousness and vital signs were monitored continuously by radiology nursing throughout the procedure under my direct supervision. FLUOROSCOPY TIME:  None COMPLICATIONS: None immediate. PROCEDURE: Informed written consent was obtained from the  patient after a thorough discussion of the procedural risks, benefits and alternatives. All questions  were addressed. Maximal Sterile Barrier Technique was utilized including caps, mask, sterile gowns, sterile gloves, sterile drape, hand hygiene and skin antiseptic. A timeout was performed prior to the initiation of the procedure. A planning axial CT scan was performed. A suitable left retroperitoneal lymph node was identified. A suitable skin entry site was selected and marked. After sterile prep and drape in the standard fashion with chlorhexidine skin prep, local anesthesia was attained by infiltration with 1% lidocaine. A small dermatotomy was made. Under intermittent CT guidance, a 17 gauge introducer needle was advanced and positioned at the margin of the enlarged lymph node. Multiple 18 gauge core biopsies were then coaxially obtained using the bio Pince automated biopsy device. Biopsy specimens were placed in saline and delivered to pathology for further evaluation. Post biopsy CT imaging demonstrates no evidence of immediate complication. IMPRESSION: Technically successful CT-guided core biopsy of left retroperitoneal lymphadenopathy. Electronically Signed   By: Jacqulynn Cadet M.D.   On: 07/30/2019 16:09   Dg Abd Acute 2+v W 1v Chest  Result Date: 07/24/2019 CLINICAL DATA:  Fall with hip pain EXAM: DG ABDOMEN ACUTE W/ 1V CHEST COMPARISON:  11/16/2016 chest x-ray FINDINGS: Single-view chest demonstrate surgical hardware in the lower cervical spine. No focal opacity or pleural effusion. Normal heart size. No pneumothorax. Supine and upright views of the abdomen demonstrate no free air beneath the diaphragm. Nonobstructed gas pattern with moderate stool. No radiopaque calculi. Phleboliths in the pelvis IMPRESSION: Negative abdominal radiographs.  No acute cardiopulmonary disease. Electronically Signed   By: Donavan Foil M.D.   On: 07/24/2019 15:37   Dg Hip Unilat W Or Wo Pelvis 2-3 Views Left  Result Date: 07/24/2019 CLINICAL DATA:  Fall with left hip pain EXAM: DG HIP (WITH OR WITHOUT PELVIS) 2-3V  LEFT COMPARISON:  11/16/2016, CT 06/09/2011 FINDINGS: SI joints are non widened. Pubic symphysis and rami appear intact. No fracture or malalignment. Mild degenerative changes of the left hip with mild joint space narrowing and osteophytosis at the left femoral head neck junction. IMPRESSION: No acute osseous abnormality Electronically Signed   By: Donavan Foil M.D.   On: 07/24/2019 15:36         Discharge Exam: Vitals:   07/31/19 1500 08/01/19 0555  BP: 131/60 114/64  Pulse: 64 74  Resp: 16 16  Temp: 98 F (36.7 C) 98.2 F (36.8 C)  SpO2: 99% 96%   Vitals:   07/31/19 0508 07/31/19 1123 07/31/19 1500 08/01/19 0555  BP: (!) 103/53 135/70 131/60 114/64  Pulse: 79 81 64 74  Resp: 18 18 16 16   Temp: 98 F (36.7 C) 98.2 F (36.8 C) 98 F (36.7 C) 98.2 F (36.8 C)  TempSrc: Oral Oral Oral Oral  SpO2: 95% 97% 99% 96%  Weight:      Height:        General: Pt is alert, awake, not in acute distress Cardiovascular: RRR, S1/S2 +, no rubs, no gallops Respiratory: bibasilar crackles, no wheeze Abdominal: Soft, lower abd without guarding, ND, bowel sounds + Extremities: no edema, no cyanosis   The results of significant diagnostics from this hospitalization (including imaging, microbiology, ancillary and laboratory) are listed below for reference.    Significant Diagnostic Studies: Ct Head Wo Contrast  Result Date: 07/24/2019 CLINICAL DATA:  Trauma. EXAM: CT HEAD WITHOUT CONTRAST CT CERVICAL SPINE WITHOUT CONTRAST TECHNIQUE: Multidetector CT imaging of the head and cervical spine was performed  following the standard protocol without intravenous contrast. Multiplanar CT image reconstructions of the cervical spine were also generated. COMPARISON:  CT scan of November 16, 2016. FINDINGS: CT HEAD FINDINGS Brain: Mild chronic ischemic white matter disease is noted. No mass effect or midline shift is noted. Ventricular size is within normal limits. There is no evidence of mass lesion,  hemorrhage or acute infarction. Vascular: No hyperdense vessel or unexpected calcification. Skull: Normal. Negative for fracture or focal lesion. Sinuses/Orbits: Right frontal and ethmoid mucosal thickening is noted. Other: None. CT CERVICAL SPINE FINDINGS Alignment: Normal. Skull base and vertebrae: No acute fracture. No primary bone lesion or focal pathologic process. Soft tissues and spinal canal: No prevertebral fluid or swelling. No visible canal hematoma. Disc levels: Status post surgical anterior fusion of C5-6. Mild anterior osteophyte formation is also noted at C4-5 and C6-7. Upper chest: Negative. Other: None. IMPRESSION: Mild chronic ischemic white matter disease. No acute intracranial abnormality seen. Postsurgical and degenerative changes are noted in the cervical spine. No fracture or significant spondylolisthesis is noted. Electronically Signed   By: Marijo Conception M.D.   On: 07/24/2019 16:20   Ct Chest W Contrast  Result Date: 07/25/2019 CLINICAL DATA:  Abdominal lymphadenopathy, evaluate chest, history of recent falls and weakness EXAM: CT CHEST WITH CONTRAST TECHNIQUE: Multidetector CT imaging of the chest was performed during intravenous contrast administration. CONTRAST:  55mL OMNIPAQUE IOHEXOL 300 MG/ML  SOLN COMPARISON:  CT abdomen pelvis, 07/24/2019, CT chest, 09/20/2009 FINDINGS: Cardiovascular: Aortic atherosclerosis. Normal heart size. Extensive 3 vessel coronary artery calcifications. No pericardial effusion. Mediastinum/Nodes: There is an enlarged paraesophageal lymph node measuring 2.0 x 1.4 cm at the level of T6-T7 (series 2, image 71). There are prominent periaortic and retrocrural nodes lower in the thorax (series 2, image 135, 153). There are enlarged left lower cervical/superior mediastinal lymph nodes measuring up to 1.2 x 1.0 cm (series 2, image 7). No other enlarged mediastinal, hilar, or axillary lymph nodes. Thyroid gland, trachea, and esophagus demonstrate no significant  findings. Lungs/Pleura: Lungs are clear. No pleural effusion or pneumothorax. Upper Abdomen: No acute abnormality. Bulky retroperitoneal lymphadenopathy, better assessed on prior CT of the abdomen pelvis. Musculoskeletal: No chest wall mass or suspicious bone lesions identified. IMPRESSION: 1. There is an enlarged paraesophageal lymph node measuring 2.0 x 1.4 cm at the level of T6-T7 (series 2, image 71). There are prominent periaortic and retrocrural nodes lower in the thorax (series 2, image 135, 153). 2. There are enlarged left lower cervical/superior mediastinal lymph nodes measuring up to 1.2 x 1.0 cm (series 2, image 7). 3.  Coronary artery disease.  Aortic Atherosclerosis (ICD10-I70.0). Electronically Signed   By: Eddie Candle M.D.   On: 07/25/2019 14:51   Ct Cervical Spine Wo Contrast  Result Date: 07/24/2019 CLINICAL DATA:  Trauma. EXAM: CT HEAD WITHOUT CONTRAST CT CERVICAL SPINE WITHOUT CONTRAST TECHNIQUE: Multidetector CT imaging of the head and cervical spine was performed following the standard protocol without intravenous contrast. Multiplanar CT image reconstructions of the cervical spine were also generated. COMPARISON:  CT scan of November 16, 2016. FINDINGS: CT HEAD FINDINGS Brain: Mild chronic ischemic white matter disease is noted. No mass effect or midline shift is noted. Ventricular size is within normal limits. There is no evidence of mass lesion, hemorrhage or acute infarction. Vascular: No hyperdense vessel or unexpected calcification. Skull: Normal. Negative for fracture or focal lesion. Sinuses/Orbits: Right frontal and ethmoid mucosal thickening is noted. Other: None. CT CERVICAL SPINE FINDINGS Alignment: Normal. Skull  base and vertebrae: No acute fracture. No primary bone lesion or focal pathologic process. Soft tissues and spinal canal: No prevertebral fluid or swelling. No visible canal hematoma. Disc levels: Status post surgical anterior fusion of C5-6. Mild anterior osteophyte  formation is also noted at C4-5 and C6-7. Upper chest: Negative. Other: None. IMPRESSION: Mild chronic ischemic white matter disease. No acute intracranial abnormality seen. Postsurgical and degenerative changes are noted in the cervical spine. No fracture or significant spondylolisthesis is noted. Electronically Signed   By: Marijo Conception M.D.   On: 07/24/2019 16:20   Mr Thoracic Spine Wo Contrast  Result Date: 07/30/2019 CLINICAL DATA:  Suspected lymphoma.  Back pain and leg weakness. EXAM: MRI THORACIC SPINE WITHOUT CONTRAST TECHNIQUE: Multiplanar, multisequence MR imaging of the thoracic spine was performed. No intravenous contrast was administered. COMPARISON:  MRI 11/17/2016 FINDINGS: Alignment:  Normal Vertebrae: Normal.  No fracture or marrow space abnormality. Cord:  Normal.  No cord compression or primary cord lesion. Paraspinal and other soft tissues: Paraesophageal node as shown by CT. Tiny amount of pleural fluid. Disc levels: No significant disc disease in the thoracic region. No disc herniation or stenosis of the canal or foramina. No significant facet arthropathy. IMPRESSION: No significant thoracic spinal abnormality. No significant degenerative disease, stenosis or nerve compression. No evidence of fracture or bone marrow lesion. Paraesophageal lymph node as shown by CT. Electronically Signed   By: Nelson Chimes M.D.   On: 07/30/2019 15:15   Mr Lumbar Spine Wo Contrast  Result Date: 07/30/2019 CLINICAL DATA:  Generalized weakness and back pain. Frequent falling. EXAM: MRI LUMBAR SPINE WITHOUT CONTRAST TECHNIQUE: Multiplanar, multisequence MR imaging of the lumbar spine was performed. No intravenous contrast was administered. COMPARISON:  CT 07/24/2019.  MRI 11/17/2016 FINDINGS: Segmentation:  5 lumbar type vertebral bodies. Alignment:  Normal Vertebrae:  No fracture or primary bone lesion. Conus medullaris and cauda equina: Conus extends to the L1 level. Conus and cauda equina appear  normal. Paraspinal and other soft tissues: Massive retroperitoneal lymphadenopathy as seen on the recent CT. Not present in January of 2018. Disc levels: No significant finding at T12-L1 or L1-2. L2-3: Mild desiccation and bulging of the disc. No compressive stenosis. L3-4: Normal interspace. L4-5: Mild bulging of the disc. No compressive stenosis. Mild facet osteoarthritis. L5-S1: Previous right hemilaminectomy. Right posterolateral disc herniation/scarring with narrowing of the right lateral recess that could compress the right S1 nerve. Similar appearance to the study of 2018. IMPRESSION: Massive retroperitoneal lymphadenopathy. No evidence of marrow space tumor or fracture. Previous right hemilaminectomy at L5-S1. Right lateral recess stenosis because of protruding disc material and scarring could possibly affect the right S1 nerve. Appearance is unchanged since 2018. Electronically Signed   By: Nelson Chimes M.D.   On: 07/30/2019 15:12   Ct Abdomen Pelvis W Contrast  Result Date: 07/24/2019 CLINICAL DATA:  Bilateral leg weakness. Patient fell in the bathtub today. EXAM: CT ABDOMEN AND PELVIS WITH CONTRAST TECHNIQUE: Multidetector CT imaging of the abdomen and pelvis was performed using the standard protocol following bolus administration of intravenous contrast. CONTRAST:  48mL OMNIPAQUE IOHEXOL 300 MG/ML SOLN, 47mL OMNIPAQUE IOHEXOL 300 MG/ML SOLN COMPARISON:  06/09/2011 FINDINGS: Lower chest: Minor linear atelectasis or scarring in the anterior lung bases. Lung bases otherwise clear. Heart normal in size. Hepatobiliary: No focal liver abnormality is seen. No gallstones, gallbladder wall thickening, or biliary dilatation. Pancreas: Unremarkable. No pancreatic ductal dilatation or surrounding inflammatory changes. Spleen: Normal in size without focal abnormality. Adrenals/Urinary  Tract: No adrenal masses. Kidneys are normal in position. There is bilateral renal cortical thinning. 18 mm low-attenuation mass,  posterior midpole the left kidney, consistent with a cyst. No other renal masses. Small nonobstructing stones in both kidneys. No hydronephrosis. Ureters normal in course and in caliber. No ureteral stones. Bladder moderately distended. There are dependent stones with some mild increased attenuation in the dependent bladder is well, likely debris. No wall thickening or convincing mass. Stomach/Bowel: Stomach is unremarkable. Small bowel and colon are normal in caliber. No wall thickening. No inflammation. Mild increased colonic stool burden. No evidence of appendicitis. Appendix not visualized. Vascular/Lymphatic: There is extensive adenopathy. Bulky retroperitoneal adenopathy is noted extending from level of the adrenal glands into the pelvis. Node at the level of the midpole the left kidney, left periaortic, measures 4.3 cm in short axis. Left periaortic node at the level of the bifurcation measures 4.3 cm short axis. There are bulky left pelvic lymph nodes. Posteroinferior pelvic node, to the left of the lower sigmoid colon, measures 7.4 x 4.6 cm transversely. Adjacent left external iliac chain node measures 7.2 x 3.5 cm adenopathy surrounds the common iliac veins, greater on the left, as well as the left external iliac vein. It is not defined and may be occluded. No mesenteric adenopathy. No enlarged inguinal lymph nodes. Dense aortic atherosclerosis.  No aneurysm. Reproductive: Prostate is enlarged measuring 5.9 x 5.5 x 5.6 cm. Other: No abdominal wall hernia or abnormality. No abdominopelvic ascites. Musculoskeletal: No fracture or acute finding. No osteoblastic or osteolytic lesions. IMPRESSION: 1. There is bulky retroperitoneal and left pelvic lymphadenopathy that has developed since the prior CT. Adenopathy may occlude the left common and/or external iliac vein. Differential diagnosis includes lymphoma and other lymphoproliferative disorders. Consider the possibility of prostate carcinoma metastatic to  lymph nodes. 2. No acute findings within the abdomen or pelvis. 3. Bilateral intrarenal stones. Dependent bladder stones as well as a small amount of dependent bladder debris. 4. Bilateral renal cortical thinning. 5. Aortic atherosclerosis. Electronically Signed   By: Lajean Manes M.D.   On: 07/24/2019 20:21   US Renal  Result Date: 07/25/2019 CLINICAL DATA:  Acute kidney injury. EXAM: RENAL / URINARY TRACT ULTRASOUND COMPLETE COMPARISON:  CT abdomen and pelvis 07/24/2019. FINDINGS: Right Kidney: Renal measurements: 10.4 x 5.1 x 4.9 cm = volume: 138 mL . Echogenicity within normal limits. No mass or hydronephrosis visualized. Cortex is thinned. Punctate nonobstructing stone in the lower pole seen on prior CT noted. Left Kidney: Renal measurements: 11.2 x 5.9 x 5.1 cm = volume: 180 mL. Echogenicity within normal limits. No solid mass or hydronephrosis visualized. Cortex is mildly thinned. Simple 2.1 cm cyst noted. Small nonobstructing stones are seen as on prior CT. Bladder: Bladder calculi are identified as seen on prior CT. The bladder is distended. IMPRESSION: No acute abnormality.  Negative for hydronephrosis. Bilateral nonobstructing renal stones, more numerous on the left. Distended urinary bladder. Electronically Signed   By: Inge Rise M.D.   On: 07/25/2019 14:48   Ct Biopsy  Result Date: 07/30/2019 INDICATION: 73 year old male with newly diagnosed extensive pelvic and retroperitoneal lymphadenopathy concerning for lymphoma versus metastatic pancreatic cancer. EXAM: CT-guided biopsy retroperitoneal lymph node MEDICATIONS: None. ANESTHESIA/SEDATION: Moderate (conscious) sedation was employed during this procedure. A total of Versed 1 mg and Fentanyl 50 mcg was administered intravenously. Moderate Sedation Time: 10 minutes. The patient's level of consciousness and vital signs were monitored continuously by radiology nursing throughout the procedure under my direct supervision. FLUOROSCOPY  TIME:   None COMPLICATIONS: None immediate. PROCEDURE: Informed written consent was obtained from the patient after a thorough discussion of the procedural risks, benefits and alternatives. All questions were addressed. Maximal Sterile Barrier Technique was utilized including caps, mask, sterile gowns, sterile gloves, sterile drape, hand hygiene and skin antiseptic. A timeout was performed prior to the initiation of the procedure. A planning axial CT scan was performed. A suitable left retroperitoneal lymph node was identified. A suitable skin entry site was selected and marked. After sterile prep and drape in the standard fashion with chlorhexidine skin prep, local anesthesia was attained by infiltration with 1% lidocaine. A small dermatotomy was made. Under intermittent CT guidance, a 17 gauge introducer needle was advanced and positioned at the margin of the enlarged lymph node. Multiple 18 gauge core biopsies were then coaxially obtained using the bio Pince automated biopsy device. Biopsy specimens were placed in saline and delivered to pathology for further evaluation. Post biopsy CT imaging demonstrates no evidence of immediate complication. IMPRESSION: Technically successful CT-guided core biopsy of left retroperitoneal lymphadenopathy. Electronically Signed   By: Jacqulynn Cadet M.D.   On: 07/30/2019 16:09   Dg Abd Acute 2+v W 1v Chest  Result Date: 07/24/2019 CLINICAL DATA:  Fall with hip pain EXAM: DG ABDOMEN ACUTE W/ 1V CHEST COMPARISON:  11/16/2016 chest x-ray FINDINGS: Single-view chest demonstrate surgical hardware in the lower cervical spine. No focal opacity or pleural effusion. Normal heart size. No pneumothorax. Supine and upright views of the abdomen demonstrate no free air beneath the diaphragm. Nonobstructed gas pattern with moderate stool. No radiopaque calculi. Phleboliths in the pelvis IMPRESSION: Negative abdominal radiographs.  No acute cardiopulmonary disease. Electronically Signed   By: Donavan Foil M.D.   On: 07/24/2019 15:37   Dg Hip Unilat W Or Wo Pelvis 2-3 Views Left  Result Date: 07/24/2019 CLINICAL DATA:  Fall with left hip pain EXAM: DG HIP (WITH OR WITHOUT PELVIS) 2-3V LEFT COMPARISON:  11/16/2016, CT 06/09/2011 FINDINGS: SI joints are non widened. Pubic symphysis and rami appear intact. No fracture or malalignment. Mild degenerative changes of the left hip with mild joint space narrowing and osteophytosis at the left femoral head neck junction. IMPRESSION: No acute osseous abnormality Electronically Signed   By: Donavan Foil M.D.   On: 07/24/2019 15:36     Microbiology: Recent Results (from the past 240 hour(s))  SARS CORONAVIRUS 2 (Charnita Trudel 6-24 HRS) Nasopharyngeal Nasopharyngeal Swab     Status: None   Collection Time: 07/24/19  9:53 PM   Specimen: Nasopharyngeal Swab  Result Value Ref Range Status   SARS Coronavirus 2 NEGATIVE NEGATIVE Final    Comment: (NOTE) SARS-CoV-2 target nucleic acids are NOT DETECTED. The SARS-CoV-2 RNA is generally detectable in upper and lower respiratory specimens during the acute phase of infection. Negative results do not preclude SARS-CoV-2 infection, do not rule out co-infections with other pathogens, and should not be used as the sole basis for treatment or other patient management decisions. Negative results must be combined with clinical observations, patient history, and epidemiological information. The expected result is Negative. Fact Sheet for Patients: SugarRoll.be Fact Sheet for Healthcare Providers: https://www.woods-mathews.com/ This test is not yet approved or cleared by the [place] FDA and  has been authorized for detection and/or diagnosis of SARS-CoV-2 by FDA under an Emergency Use Authorization (EUA). This EUA will remain  in effect (meaning this test can be used) for the duration of the COVID-19 declaration under Section 56 4(b)(1) of the Act,  21 U.S.C. section  360bbb-3(b)(1), unless the authorization is terminated or revoked sooner. Performed at [facility], [place] [address], [place], [place] [address]      Labs: Basic Metabolic Panel: Recent Labs  Lab 07/26/19 0558 07/27/19 0552 07/29/19 0630 07/29/19 1244 07/31/19 0454 08/01/19 0650  NA 139 138 139  --  138 137  K 4.5 4.6 4.3  --  4.1 3.7  CL 106 106 108  --  110 111  CO2 26 25 27   --  22 20*  GLUCOSE 106* 105* 137*  --  88 110*  BUN 20 16 17   --  23 22  CREATININE 1.45* 1.39* 1.51*  --  1.36* 1.32*  CALCIUM 10.6* 10.4* 10.2 10.1 9.2 8.4*  MG 2.0 2.0  --   --   --   --    Liver Function Tests: Recent Labs  Lab 07/26/19 0558 07/29/19 0630 07/31/19 0454 08/01/19 0650  AST 14*  --  34 25  ALT 14  --  38 28  ALKPHOS 64  --  57 60  BILITOT 0.5  --  0.3 0.1*  PROT 6.2*  --  6.1* 5.9*  ALBUMIN 2.9* 2.7* 2.6* 2.5*   No results for input(s): LIPASE, AMYLASE in the last 168 hours. No results for input(s): AMMONIA in the last 168 hours. CBC: Recent Labs  Lab 07/26/19 0558 07/27/19 0552 07/29/19 0630  WBC 3.4* 3.1* 3.5*  HGB 9.2* 9.1* 9.6*  HCT 29.9* 29.9* 30.9*  MCV 105.7* 106.0* 104.0*  PLT 193 203 236   Cardiac Enzymes: No results for input(s): CKTOTAL, CKMB, CKMBINDEX, TROPONINI in the last 168 hours. BNP: Invalid input(s): POCBNP CBG: Recent Labs  Lab 07/31/19 0745 07/31/19 1241 07/31/19 1622 07/31/19 2203 08/01/19 0737  GLUCAP 80 120* 123* 131* 102*    Time coordinating discharge:  36 minutes  Signed:  Orson [name], DO [facility] Pager: [phone] 08/01/2019, 11:37 AM

## 2019-08-01 NOTE — TOC Transition Note (Signed)
Transition of Care [facility]) - CM/SW Discharge Note   Patient Details  Name: KYZEN RAINGE MRN: [id number] Date of Birth: 05-18-1946  Transition of Care [facility]) CM/SW Contact:  Boneta Lucks, RN Phone Number: 08/01/2019, 11:41 AM   Clinical Narrative:   [facility] has been holding a bed, Patient ready for discharge today, Keri getting bed ready. Updated Tammy his daughter, she will take belonging to facility. RN to call report.     Final next level of care: Skilled Nursing Facility Barriers to Discharge: Barriers Resolved   Patient Goals and CMS Choice Patient states their goals for this hospitalization and ongoing recovery are:: Rehab and return home CMS Medicare.gov Compare Post Acute Care list provided to:: Patient Choice offered to / list presented to : Patient  Discharge Placement              Patient chooses bed at: [facility] Patient to be transferred to facility by: [facility] staff Name of family member notified: [name] - daughter Patient and family notified of of transfer: 08/01/19  Discharge Plan and Services In-house Referral: Clinical Social Work   Post Acute Care Choice: [facility]              Readmission Risk Interventions Readmission Risk Prevention Plan 07/30/2019 07/28/2019  Post Dischage Appt Not Complete -  Appt Comments [facility], [facility], [facility] MD will follow while pt at short term rehab -  Medication Screening - Complete  Transportation Screening - Complete  Some recent data might be hidden

## 2019-08-01 NOTE — Progress Notes (Signed)
Pt IV removed, WNL. D/C instructions given to pt and [facility]. Pt is being transported via [facility] staff.

## 2019-08-01 NOTE — Progress Notes (Signed)
Subjective: Feeling better today. Still some "sore" odynophagia, but improved. Ate his breakfast plate this morning as well as OJ. Is going to try apple juice in a little bit. Denies abdominal pain, N/V. Had a bowel movement this morning which was "normal" and no hematochezia or melena. No other GI complaints. Is not wanting go to a nursing home.  Objective: Vital signs in last 24 hours: Temp:  [98 F (36.7 C)-98.3 F (36.8 C)] 98.2 F (36.8 C) (09/25 0555) Pulse Rate:  [64-81] 74 (09/25 0555) Resp:  [16-18] 16 (09/25 0555) BP: (114-135)/(59-70) 114/64 (09/25 0555) SpO2:  [96 %-99 %] 96 % (09/25 0555) Last BM Date: 07/31/19 General:   Alert and oriented, pleasant Head:  Normocephalic and atraumatic. Eyes:  No icterus, sclera clear. Conjuctiva pink.  Mouth: Mucous membranes pink and moist, no evidence of thrush Heart:  S1, S2 present, no murmurs noted.  Lungs: Clear to auscultation bilaterally, without wheezing, rales, or rhonchi.  Abdomen:  Bowel sounds present, soft, non-tender, non-distended. No HSM or hernias noted. No rebound or guarding. No masses appreciated  Msk:  Symmetrical without gross deformities. Pulses:  Normal bilateral DP pulses noted. Extremities:  Without clubbing or edema. Neurologic:  Alert and  oriented x4;  grossly normal neurologically. Psych:  Alert and cooperative. Normal mood and affect.  Intake/Output from previous day: 09/24 0701 - 09/25 0700 In: 2017.6 [P.O.:240; I.V.:1777.6] Out: 1200 [Urine:1200] Intake/Output this shift: No intake/output data recorded.  Lab Results: No results for input(s): WBC, HGB, HCT, PLT in the last 72 hours. BMET Recent Labs    07/29/19 1244 07/31/19 0454 08/01/19 0650  NA  --  138 137  K  --  4.1 3.7  CL  --  110 111  CO2  --  22 20*  GLUCOSE  --  88 110*  BUN  --  23 22  CREATININE  --  1.36* 1.32*  CALCIUM 10.1 9.2 8.4*   LFT Recent Labs    07/31/19 0454 08/01/19 0650  PROT 6.1* 5.9*  ALBUMIN 2.6*  2.5*  AST 34 25  ALT 38 28  ALKPHOS 57 60  BILITOT 0.3 0.1*   PT/INR No results for input(s): LABPROT, INR in the last 72 hours. Hepatitis Panel No results for input(s): HEPBSAG, HCVAB, HEPAIGM, HEPBIGM in the last 72 hours.   Studies/Results: Mr Thoracic Spine Wo Contrast  Result Date: 07/30/2019 CLINICAL DATA:  Suspected lymphoma.  Back pain and leg weakness. EXAM: MRI THORACIC SPINE WITHOUT CONTRAST TECHNIQUE: Multiplanar, multisequence MR imaging of the thoracic spine was performed. No intravenous contrast was administered. COMPARISON:  MRI 11/17/2016 FINDINGS: Alignment:  Normal Vertebrae: Normal.  No fracture or marrow space abnormality. Cord:  Normal.  No cord compression or primary cord lesion. Paraspinal and other soft tissues: Paraesophageal node as shown by CT. Tiny amount of pleural fluid. Disc levels: No significant disc disease in the thoracic region. No disc herniation or stenosis of the canal or foramina. No significant facet arthropathy. IMPRESSION: No significant thoracic spinal abnormality. No significant degenerative disease, stenosis or nerve compression. No evidence of fracture or bone marrow lesion. Paraesophageal lymph node as shown by CT. Electronically Signed   By: Nelson Chimes M.D.   On: 07/30/2019 15:15   Mr Lumbar Spine Wo Contrast  Result Date: 07/30/2019 CLINICAL DATA:  Generalized weakness and back pain. Frequent falling. EXAM: MRI LUMBAR SPINE WITHOUT CONTRAST TECHNIQUE: Multiplanar, multisequence MR imaging of the lumbar spine was performed. No intravenous contrast was administered. COMPARISON:  CT 07/24/2019.  MRI 11/17/2016 FINDINGS: Segmentation:  5 lumbar type vertebral bodies. Alignment:  Normal Vertebrae:  No fracture or primary bone lesion. Conus medullaris and cauda equina: Conus extends to the L1 level. Conus and cauda equina appear normal. Paraspinal and other soft tissues: Massive retroperitoneal lymphadenopathy as seen on the recent CT. Not present in  January of 2018. Disc levels: No significant finding at T12-L1 or L1-2. L2-3: Mild desiccation and bulging of the disc. No compressive stenosis. L3-4: Normal interspace. L4-5: Mild bulging of the disc. No compressive stenosis. Mild facet osteoarthritis. L5-S1: Previous right hemilaminectomy. Right posterolateral disc herniation/scarring with narrowing of the right lateral recess that could compress the right S1 nerve. Similar appearance to the study of 2018. IMPRESSION: Massive retroperitoneal lymphadenopathy. No evidence of marrow space tumor or fracture. Previous right hemilaminectomy at L5-S1. Right lateral recess stenosis because of protruding disc material and scarring could possibly affect the right S1 nerve. Appearance is unchanged since 2018. Electronically Signed   By: Nelson Chimes M.D.   On: 07/30/2019 15:12   Ct Biopsy  Result Date: 07/30/2019 INDICATION: 73 year old male with newly diagnosed extensive pelvic and retroperitoneal lymphadenopathy concerning for lymphoma versus metastatic pancreatic cancer. EXAM: CT-guided biopsy retroperitoneal lymph node MEDICATIONS: None. ANESTHESIA/SEDATION: Moderate (conscious) sedation was employed during this procedure. A total of Versed 1 mg and Fentanyl 50 mcg was administered intravenously. Moderate Sedation Time: 10 minutes. The patient's level of consciousness and vital signs were monitored continuously by radiology nursing throughout the procedure under my direct supervision. FLUOROSCOPY TIME:  None COMPLICATIONS: None immediate. PROCEDURE: Informed written consent was obtained from the patient after a thorough discussion of the procedural risks, benefits and alternatives. All questions were addressed. Maximal Sterile Barrier Technique was utilized including caps, mask, sterile gowns, sterile gloves, sterile drape, hand hygiene and skin antiseptic. A timeout was performed prior to the initiation of the procedure. A planning axial CT scan was performed. A  suitable left retroperitoneal lymph node was identified. A suitable skin entry site was selected and marked. After sterile prep and drape in the standard fashion with chlorhexidine skin prep, local anesthesia was attained by infiltration with 1% lidocaine. A small dermatotomy was made. Under intermittent CT guidance, a 17 gauge introducer needle was advanced and positioned at the margin of the enlarged lymph node. Multiple 18 gauge core biopsies were then coaxially obtained using the bio Pince automated biopsy device. Biopsy specimens were placed in saline and delivered to pathology for further evaluation. Post biopsy CT imaging demonstrates no evidence of immediate complication. IMPRESSION: Technically successful CT-guided core biopsy of left retroperitoneal lymphadenopathy. Electronically Signed   By: Jacqulynn Cadet M.D.   On: 07/30/2019 16:09    Assessment: 73 year old gentleman admitted with generalized weakness, falls 1 week ago.  Work-up showing generalized lymphadenopathy, biopsy pending.  Noted to have retroperitoneal adenopathy, bulky left pelvic adenopathy, enlarged paraesophageal lymph node measuring 2 x 1.4 cm as well as other lymphadenopathy in the left lower cervical/superior mediastinal area.    Odynophagia began 07/30/2019.  No prior dysphagia or odynophagia.  Overall poor appetite with nausea and avoidance of food for the past 3 to 4 days.  Noted pain in the chest with swallowing orange juice.  No oral pain.  Evaluation yesterday revealed no evidence of thrush, no reported food or pill impaction, no typical reflux.  Etiology unclear, unlikely related to paraesophageal lymphadenopathy, esophagitis related to reflux/yeast/pill induced possibility. Started on empiric PPI and Carafate. Still no evidence of thrush  Anorexia: Poor  oral intake over the past 3 to 4 days but improved today.  Patient reporting essentially no solid food and very limited liquids until today.  Nausea without vomiting  but improved today.  Decreased risk for being unable to maintain hydration as outpatient if continues adequate oral intake.   Today his pathology is still pending. He is clinically improved. Odynophagia improved but not resolved. Ate his breakfast plate this morning, is drinking fluids. No other overt GI symptoms.  Plan: 1. Continue PPI/Carafate 2. Can consider viscous liocaine if odynophagia returns 3. No need for urgent/IP EGD today 4. Supportive measures 5. Follow for pathology results.   Thank you for allowing [place] to participate in the care of Daniel Myrtle, DNP, AGNP-C Adult & Gerontological Nurse Practitioner [facility]     LOS: 7 days    08/01/2019, 8:45 AM

## 2019-08-02 LAB — VITAMIN D 25 HYDROXY (VIT D DEFICIENCY, FRACTURES): Vit D, 25-Hydroxy: 10 ng/mL — ABNORMAL LOW (ref 30.0–100.0)

## 2019-08-04 ENCOUNTER — Inpatient Hospital Stay (HOSPITAL_COMMUNITY): Payer: No Typology Code available for payment source

## 2019-08-04 ENCOUNTER — Other Ambulatory Visit: Payer: Self-pay

## 2019-08-04 ENCOUNTER — Other Ambulatory Visit (HOSPITAL_COMMUNITY): Payer: Self-pay | Admitting: *Deleted

## 2019-08-04 ENCOUNTER — Inpatient Hospital Stay (HOSPITAL_COMMUNITY): Payer: No Typology Code available for payment source | Attending: Hematology | Admitting: Hematology

## 2019-08-04 ENCOUNTER — Encounter (HOSPITAL_COMMUNITY): Payer: Self-pay | Admitting: Hematology

## 2019-08-04 ENCOUNTER — Ambulatory Visit (HOSPITAL_COMMUNITY): Payer: Medicare Other

## 2019-08-04 VITALS — BP 111/82 | HR 68 | Temp 97.7°F | Resp 18 | Wt 189.7 lb

## 2019-08-04 DIAGNOSIS — R531 Weakness: Secondary | ICD-10-CM | POA: Diagnosis not present

## 2019-08-04 DIAGNOSIS — E538 Deficiency of other specified B group vitamins: Secondary | ICD-10-CM | POA: Insufficient documentation

## 2019-08-04 DIAGNOSIS — C8513 Unspecified B-cell lymphoma, intra-abdominal lymph nodes: Secondary | ICD-10-CM | POA: Diagnosis not present

## 2019-08-04 DIAGNOSIS — E559 Vitamin D deficiency, unspecified: Secondary | ICD-10-CM | POA: Insufficient documentation

## 2019-08-04 DIAGNOSIS — Z79899 Other long term (current) drug therapy: Secondary | ICD-10-CM | POA: Insufficient documentation

## 2019-08-04 DIAGNOSIS — C8590 Non-Hodgkin lymphoma, unspecified, unspecified site: Secondary | ICD-10-CM

## 2019-08-04 DIAGNOSIS — E119 Type 2 diabetes mellitus without complications: Secondary | ICD-10-CM | POA: Diagnosis not present

## 2019-08-04 DIAGNOSIS — R590 Localized enlarged lymph nodes: Secondary | ICD-10-CM | POA: Diagnosis present

## 2019-08-04 DIAGNOSIS — C8510 Unspecified B-cell lymphoma, unspecified site: Secondary | ICD-10-CM | POA: Insufficient documentation

## 2019-08-04 DIAGNOSIS — F329 Major depressive disorder, single episode, unspecified: Secondary | ICD-10-CM | POA: Insufficient documentation

## 2019-08-04 DIAGNOSIS — E785 Hyperlipidemia, unspecified: Secondary | ICD-10-CM | POA: Diagnosis not present

## 2019-08-04 DIAGNOSIS — I10 Essential (primary) hypertension: Secondary | ICD-10-CM | POA: Diagnosis not present

## 2019-08-04 LAB — PTH-RELATED PEPTIDE: PTH-related peptide: <2 pmol/L

## 2019-08-04 LAB — LACTATE DEHYDROGENASE: LDH: 136 U/L (ref 98–192)

## 2019-08-04 LAB — CALCITRIOL (1,25 DI-OH VIT D): Vit D, 1,25-Dihydroxy: 24.2 pg/mL (ref 19.9–79.3)

## 2019-08-04 LAB — URIC ACID: Uric Acid, Serum: 4.3 mg/dL (ref 3.7–8.6)

## 2019-08-04 NOTE — Progress Notes (Signed)
Orders placed for PICC line insertion tomorrow for chemotherapy administration.

## 2019-08-04 NOTE — Progress Notes (Signed)
AP nurse called to cancel the PICC insertion for tom. 08/05/19

## 2019-08-04 NOTE — Progress Notes (Signed)
Daniel Richards, Daniel Richards [id number]   CLINIC:  Medical Oncology/Hematology  PCP:  Daniel Court, DO [address] [place] [address] [phone]   REASON FOR VISIT:  Follow-up for lymphoma.  CURRENT THERAPY: Under work-up.   INTERVAL HISTORY:  Daniel Richards 73 y.o. male seen for follow-up of pelvic and retroperitoneal lymphadenopathy.  He had biopsy done on 07/30/2019.  Subsequently he was discharged to [facility] rehab last Friday.  He reports that he is able to stand on his feet.  He has felt severe weakness in the legs and generalized weakness in the last 2 months.  He is accompanied by his son and daughter.  He does not have good appetite and he lost 3 to 4 pounds since hospitalization.  Denies any nausea, vomiting, diarrhea or constipation.    REVIEW OF SYSTEMS:  Review of Systems  All other systems reviewed and are negative.    PAST MEDICAL/SURGICAL HISTORY:  Past Medical History:  Diagnosis Date  . Anxiety and depression   . Diabetes mellitus 06/09/2011   pt taking metformin  . DJD of shoulder    right   . Hyperlipidemia   . Hypertension   . Migraines   . Tremor   . Vitamin D deficiency    Past Surgical History:  Procedure Laterality Date  . BACK SURGERY    . KNEE SURGERY    . SHOULDER SURGERY       SOCIAL HISTORY:  Social History   Socioeconomic History  . Marital status: Divorced    Spouse name: Not on file  . Number of children: 2  . Years of education: 12+  . Highest education level: Not on file  Occupational History  . Not on file  Social Needs  . Financial resource strain: Somewhat hard  . Food insecurity    Worry: Never true    Inability: Never true  . Transportation needs    Medical: No    Non-medical: No  Tobacco Use  . Smoking status: Never Smoker  . Smokeless tobacco: Never Used  Substance and Sexual Activity  . Alcohol use: No  . Drug use: No  . Sexual activity: Not Currently   Lifestyle  . Physical activity    Days per week: 0 days    Minutes per session: 0 min  . Stress: Not at all  Relationships  . Social Herbalist on phone: Never    Gets together: Twice a week    Attends religious service: More than 4 times per year    Active member of club or organization: No    Attends meetings of clubs or organizations: Never    Relationship status: Divorced  . Intimate partner violence    Fear of current or ex partner: No    Emotionally abused: No    Physically abused: No    Forced sexual activity: No  Other Topics Concern  . Not on file  Social History Narrative   Lives at home with son.   Caffeine use: Drinks 1 cup coffee (3 cups per week-decaf)    FAMILY HISTORY:  Family History  Problem Relation Age of Onset  . Cancer Sister   . Cancer Sister   . Cancer Brother   . Heart attack Father   . Cancer Brother   . Heart attack Brother   . Healthy Son   . Healthy Daughter   . Migraines Neg Hx  CURRENT MEDICATIONS:  Outpatient Encounter Medications as of 08/04/2019  Medication Sig Note  . amLODipine (NORVASC) 5 MG tablet Take 1 tablet (5 mg total) by mouth daily.   . calcitRIOL (ROCALTROL) 0.5 MCG capsule Take 1 capsule by mouth daily. 07/25/2019: Pt is unsure if he is still taking.  Pharmacy confirms last fill on 05/20/2019 for 90 days supply.  . citalopram (CELEXA) 20 MG tablet Take 20 mg by mouth at bedtime.   . folic acid (FOLVITE) 1 MG tablet Take 1 tablet (1 mg total) by mouth daily.   Marland Kitchen gabapentin (NEURONTIN) 300 MG capsule Take 600 mg by mouth 3 (three) times daily.    Marland Kitchen HYDROcodone-acetaminophen (NORCO) 10-325 MG tablet Take 1 tablet by mouth every 4 (four) hours as needed for severe pain.   . INVOKANA 100 MG TABS tablet Take 1 tablet by mouth daily.   . metFORMIN (GLUCOPHAGE) 500 MG tablet Take 1 tablet by mouth daily.   . pantoprazole (PROTONIX) 40 MG tablet Take 1 tablet (40 mg total) by mouth 2 (two) times daily before a meal.    . propranolol (INDERAL) 40 MG tablet Take 40 mg by mouth 2 (two) times daily.   . sucralfate (CARAFATE) 1 GM/10ML suspension Take 10 mLs (1 g total) by mouth 4 (four) times daily -  with meals and at bedtime.   . topiramate (TOPAMAX) 100 MG tablet Take 200 mg by mouth at bedtime.   . vitamin B-12 1000 MCG tablet Take 1 tablet (1,000 mcg total) by mouth daily.   . [DISCONTINUED] tamsulosin (FLOMAX) 0.4 MG CAPS capsule Take 1 capsule by mouth daily.   . primidone (MYSOLINE) 50 MG tablet Take 1.5 tablets (75 mg total) by mouth every 8 (eight) hours. (Patient taking differently: Take 150 mg by mouth every 8 (eight) hours. )   . [DISCONTINUED] feeding supplement, ENSURE ENLIVE, (ENSURE ENLIVE) LIQD Take 237 mLs by mouth 2 (two) times daily between meals.    No facility-administered encounter medications on file as of 08/04/2019.     ALLERGIES:  No Known Allergies   PHYSICAL EXAM:  ECOG Performance status: 2  Vitals:   08/04/19 1146  BP: 111/82  Pulse: 68  Resp: 18  Temp: 97.7 F (36.5 C)  SpO2: 97%   Filed Weights   08/04/19 1146  Weight: 189 lb 11.2 oz (86 kg)    Physical Exam Vitals signs reviewed.  Constitutional:      Appearance: Normal appearance.  Cardiovascular:     Rate and Rhythm: Normal rate and regular rhythm.     Heart sounds: Normal heart sounds.  Pulmonary:     Effort: Pulmonary effort is normal.     Breath sounds: Normal breath sounds.  Abdominal:     General: There is no distension.     Palpations: Abdomen is soft. There is no mass.  Musculoskeletal:        General: No swelling.  Skin:    General: Skin is warm.  Neurological:     General: No focal deficit present.     Mental Status: He is alert and oriented to person, place, and time.  Psychiatric:        Mood and Affect: Mood normal.        Behavior: Behavior normal.      LABORATORY DATA:  I have reviewed the labs as listed.  CBC    Component Value Date/Time   WBC 3.5 (L) 07/29/2019 0630    RBC 2.97 (L) 07/29/2019 0630  HGB 9.6 (L) 07/29/2019 0630   HCT 30.9 (L) 07/29/2019 0630   PLT 236 07/29/2019 0630   MCV 104.0 (H) 07/29/2019 0630   MCH 32.3 07/29/2019 0630   MCHC 31.1 07/29/2019 0630   RDW 15.9 (H) 07/29/2019 0630   LYMPHSABS 1.3 07/24/2019 1551   MONOABS 0.9 07/24/2019 1551   EOSABS 0.0 07/24/2019 1551   BASOSABS 0.0 07/24/2019 1551   CMP Latest Ref Rng & Units 08/01/2019 07/31/2019 07/29/2019  Glucose 70 - 99 mg/dL 110(H) 88 137(H)  BUN 8 - 23 mg/dL 22 23 17   Creatinine 0.61 - 1.24 mg/dL 1.32(H) 1.36(H) 1.51(H)  Sodium 135 - 145 mmol/L 137 138 139  Potassium 3.5 - 5.1 mmol/L 3.7 4.1 4.3  Chloride 98 - 111 mmol/L 111 110 108  CO2 22 - 32 mmol/L 20(L) 22 27  Calcium 8.9 - 10.3 mg/dL 8.4(L) 9.2 10.1  Total Protein 6.5 - 8.1 g/dL 5.9(L) 6.1(L) -  Total Bilirubin 0.3 - 1.2 mg/dL 0.1(L) 0.3 -  Alkaline Phos 38 - 126 U/L 60 57 -  AST 15 - 41 U/L 25 34 -  ALT 0 - 44 U/L 28 38 -       DIAGNOSTIC IMAGING:  I have independently reviewed the scans and discussed with the patient.     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  High-grade lymphoma: -58-month history of generalized weakness, with no B symptoms. -CT abdomen and pelvis showed retroperitoneal, left pelvic and external iliac adenopathy, largest lymph node measuring up to 7.5 cm.  LDH was normal. -MRI of the thoracic and lumbar spine did not show any spinal involvement. - IR biopsy of the left retroperitoneal lymph node on 07/30/2019, consistent with high-grade lymphoma. -He is discharged to [facility].  He is able to stand on his feet. - I have recommended a PET CT scan and port placement as soon as possible.  We will check hepatitis panel, beta-2 microglobulin, LDH levels. -I have recommended R CHOP based chemotherapy.  I will also request lymphoma FISH panel for MYC, BCL-2 and BCL 6. -I will see him back after the PET CT scan.  2.  Hypercalcemia: - Had hypercalcemia of 10.6  with albumin of 3 and received Zometa on 07/28/2019. -Calcium normalized subsequently.  3.  123456 and folic acid deficiency: - At presentation he had low levels.  He will continue oral supplements.   Total time spent is 40 minutes with more than 50% of the time spent face-to-face discussing new diagnosis, further work-up, treatment plan, counseling and coordination of care.  Orders placed this encounter:  Orders Placed This Encounter  Procedures  . NM PET Image Initial (PI) Skull Base To Thigh  . Hepatitis B surface antigen  . Hepatitis B surface antibody,qualitative  . Hepatitis B core antibody, total  . Lactate dehydrogenase  . Uric acid  . HCV RNA quant rflx ultra or genotyp  . ECHOCARDIOGRAM COMPLETE      Derek Jack, [place] [phone]

## 2019-08-04 NOTE — Patient Instructions (Addendum)
[place] at [facility] Discharge Instructions  You were seen today by Dr. Delton Coombes. He went over your recent test results. He will get blood drawn today. He will schedule for a PET scan as well as an ECHO. He will also get you scheduled for a Port-a-cath or PICC line. He will see you back after your tests for labs and follow up.   Thank you for choosing [place] at [facility] to provide your oncology and hematology care.  To afford each patient quality time with our provider, please arrive at least 15 minutes before your scheduled appointment time.   If you have a lab appointment with the [facility] please come in thru the  Main Entrance and check in at the main information desk  You need to re-schedule your appointment should you arrive 10 or more minutes late.  We strive to give you quality time with our providers, and arriving late affects you and other patients whose appointments are after yours.  Also, if you no show three or more times for appointments you may be dismissed from the clinic at the providers discretion.     Again, thank you for choosing [facility].  Our hope is that these requests will decrease the amount of time that you wait before being seen by our physicians.       _____________________________________________________________  Should you have questions after your visit to [facility], please contact our office at [phone] between the hours of 8:00 a.m. and 4:30 p.m.  Voicemails left after 4:00 p.m. will not be returned until the following business day.  For prescription refill requests, have your pharmacy contact our office and allow 72 hours.    Cancer Center Support Programs:   > Cancer Support Group  2nd Tuesday of the month 1pm-2pm, Journey Room

## 2019-08-04 NOTE — Assessment & Plan Note (Signed)
1.  High-grade lymphoma: -59-month history of generalized weakness, with no B symptoms. -CT abdomen and pelvis showed retroperitoneal, left pelvic and external iliac adenopathy, largest lymph node measuring up to 7.5 cm.  LDH was normal. -MRI of the thoracic and lumbar spine did not show any spinal involvement. - IR biopsy of the left retroperitoneal lymph node on 07/30/2019, consistent with high-grade lymphoma. -He is discharged to [facility].  He is able to stand on his feet. - I have recommended a PET CT scan and port placement as soon as possible.  We will check hepatitis panel, beta-2 microglobulin, LDH levels. -I have recommended R CHOP based chemotherapy.  I will also request lymphoma FISH panel for MYC, BCL-2 and BCL 6. -I will see him back after the PET CT scan.  2.  Hypercalcemia: - Had hypercalcemia of 10.6 with albumin of 3 and received Zometa on 07/28/2019. -Calcium normalized subsequently.  3.  123456 and folic acid deficiency: - At presentation he had low levels.  He will continue oral supplements.

## 2019-08-05 ENCOUNTER — Ambulatory Visit (HOSPITAL_COMMUNITY): Payer: Medicare Other

## 2019-08-05 ENCOUNTER — Encounter (HOSPITAL_COMMUNITY): Payer: Self-pay | Admitting: *Deleted

## 2019-08-05 ENCOUNTER — Inpatient Hospital Stay (HOSPITAL_COMMUNITY): Payer: Medicare Other

## 2019-08-05 ENCOUNTER — Ambulatory Visit (HOSPITAL_COMMUNITY)
Admission: RE | Admit: 2019-08-05 | Discharge: 2019-08-05 | Disposition: A | Discharge status: Home / Self Care | Payer: Medicare Other | Source: Ambulatory Visit | Attending: Hematology | Admitting: Hematology

## 2019-08-05 ENCOUNTER — Encounter: Payer: Self-pay | Admitting: Adult Health

## 2019-08-05 ENCOUNTER — Non-Acute Institutional Stay (SKILLED_NURSING_FACILITY): Payer: Medicare Other | Admitting: Adult Health

## 2019-08-05 ENCOUNTER — Other Ambulatory Visit: Payer: Self-pay

## 2019-08-05 ENCOUNTER — Encounter: Payer: Self-pay | Admitting: General Surgery

## 2019-08-05 ENCOUNTER — Ambulatory Visit (INDEPENDENT_AMBULATORY_CARE_PROVIDER_SITE_OTHER): Payer: Medicare Other | Admitting: General Surgery

## 2019-08-05 ENCOUNTER — Encounter (HOSPITAL_COMMUNITY): Payer: Self-pay

## 2019-08-05 VITALS — BP 120/70 | HR 69 | Temp 98.0°F | Resp 18 | Ht 72.0 in | Wt 189.0 lb

## 2019-08-05 DIAGNOSIS — C8590 Non-Hodgkin lymphoma, unspecified, unspecified site: Secondary | ICD-10-CM | POA: Diagnosis not present

## 2019-08-05 DIAGNOSIS — E1159 Type 2 diabetes mellitus with other circulatory complications: Secondary | ICD-10-CM

## 2019-08-05 DIAGNOSIS — G250 Essential tremor: Secondary | ICD-10-CM | POA: Diagnosis not present

## 2019-08-05 DIAGNOSIS — E1142 Type 2 diabetes mellitus with diabetic polyneuropathy: Secondary | ICD-10-CM | POA: Diagnosis not present

## 2019-08-05 DIAGNOSIS — E440 Moderate protein-calorie malnutrition: Secondary | ICD-10-CM

## 2019-08-05 DIAGNOSIS — R1310 Dysphagia, unspecified: Secondary | ICD-10-CM | POA: Diagnosis not present

## 2019-08-05 DIAGNOSIS — N400 Enlarged prostate without lower urinary tract symptoms: Secondary | ICD-10-CM

## 2019-08-05 DIAGNOSIS — F339 Major depressive disorder, recurrent, unspecified: Secondary | ICD-10-CM

## 2019-08-05 DIAGNOSIS — I10 Essential (primary) hypertension: Secondary | ICD-10-CM

## 2019-08-05 DIAGNOSIS — E538 Deficiency of other specified B group vitamins: Secondary | ICD-10-CM

## 2019-08-05 DIAGNOSIS — I152 Hypertension secondary to endocrine disorders: Secondary | ICD-10-CM

## 2019-08-05 DIAGNOSIS — R51 Headache: Secondary | ICD-10-CM

## 2019-08-05 DIAGNOSIS — I700 Atherosclerosis of aorta: Secondary | ICD-10-CM

## 2019-08-05 DIAGNOSIS — C8510 Unspecified B-cell lymphoma, unspecified site: Secondary | ICD-10-CM

## 2019-08-05 DIAGNOSIS — D638 Anemia in other chronic diseases classified elsewhere: Secondary | ICD-10-CM

## 2019-08-05 DIAGNOSIS — G8929 Other chronic pain: Secondary | ICD-10-CM

## 2019-08-05 LAB — HCV RNA QUANT RFLX ULTRA OR GENOTYP
HCV RNA Qnt(log copy/mL): UNDETERMINED log10 IU/mL
HepC Qn: NOT DETECTED IU/mL

## 2019-08-05 LAB — HEPATITIS B SURFACE ANTIGEN: Hepatitis B Surface Ag: NEGATIVE

## 2019-08-05 LAB — HEPATITIS B CORE ANTIBODY, TOTAL: Hep B Core Total Ab: NEGATIVE

## 2019-08-05 LAB — HEPATITIS B SURFACE ANTIBODY,QUALITATIVE: Hep B S Ab: NONREACTIVE

## 2019-08-05 NOTE — Progress Notes (Signed)
Daniel Richards; [id number]; [birth year]   HPI Patient is a 73 year old white male who was referred to my care by Dr. Delton Coombes for Port-A-Cath placement.  He has lymphoma and is about to undergo chemotherapy.  He needs central venous access.  He has 4 out of 10 body aches.  He denies any fever or chills. Past Medical History:  Diagnosis Date  . Anxiety and depression   . Diabetes mellitus 06/09/2011   pt taking metformin  . DJD of shoulder    right   . Hyperlipidemia   . Hypertension   . Migraines   . Tremor   . Vitamin D deficiency     Past Surgical History:  Procedure Laterality Date  . BACK SURGERY    . KNEE SURGERY    . SHOULDER SURGERY      Family History  Problem Relation Age of Onset  . Cancer Sister   . Cancer Sister   . Cancer Brother   . Heart attack Father   . Cancer Brother   . Heart attack Brother   . Healthy Son   . Healthy Daughter   . Migraines Neg Hx     Current Outpatient Medications on File Prior to Visit  Medication Sig Dispense Refill  . amLODipine (NORVASC) 5 MG tablet Take 1 tablet (5 mg total) by mouth daily.    . calcitRIOL (ROCALTROL) 0.5 MCG capsule Take 1 capsule by mouth daily.    . citalopram (CELEXA) 20 MG tablet Take 20 mg by mouth at bedtime.    . folic acid (FOLVITE) 1 MG tablet Take 1 tablet (1 mg total) by mouth daily.    Marland Kitchen gabapentin (NEURONTIN) 300 MG capsule Take 600 mg by mouth 3 (three) times daily.     Marland Kitchen HYDROcodone-acetaminophen (NORCO) 10-325 MG tablet Take 1 tablet by mouth every 4 (four) hours as needed for severe pain. 12 tablet 0  . INVOKANA 100 MG TABS tablet Take 1 tablet by mouth daily.    . metFORMIN (GLUCOPHAGE) 500 MG tablet Take 1 tablet by mouth daily.    . pantoprazole (PROTONIX) 40 MG tablet Take 1 tablet (40 mg total) by mouth 2 (two) times daily before a meal.    . primidone (MYSOLINE) 50 MG tablet Take 1.5 tablets (75 mg total) by mouth every 8 (eight) hours. 30 tablet 3  . propranolol (INDERAL) 40 MG tablet  Take 40 mg by mouth 2 (two) times daily.    . sucralfate (CARAFATE) 1 GM/10ML suspension Take 10 mLs (1 g total) by mouth 4 (four) times daily -  with meals and at bedtime. 420 mL 0  . topiramate (TOPAMAX) 100 MG tablet Take 200 mg by mouth at bedtime.    . vitamin B-12 1000 MCG tablet Take 1 tablet (1,000 mcg total) by mouth daily.     No current facility-administered medications on file prior to visit.     No Known Allergies  Social History   Substance and Sexual Activity  Alcohol Use No    Social History   Tobacco Use  Smoking Status Never Smoker  Smokeless Tobacco Never Used    Review of Systems  Constitutional: Positive for malaise/fatigue.  HENT: Negative.   Eyes: Negative.   Respiratory: Negative.   Cardiovascular: Negative.   Gastrointestinal: Positive for abdominal pain.  Genitourinary: Positive for urgency.  Musculoskeletal: Positive for back pain.  Skin: Negative.   Neurological: Positive for tremors.  Endo/Heme/Allergies: Negative.   Psychiatric/Behavioral: Negative.     Objective  Vitals:   08/05/19 1337  BP: 120/70  Pulse: 69  Resp: 18  Temp: 98 F (36.7 C)  SpO2: 96%    Physical Exam Vitals signs reviewed.  Constitutional:      Appearance: Normal appearance. He is not ill-appearing.  HENT:     Head: Normocephalic and atraumatic.  Cardiovascular:     Rate and Rhythm: Normal rate and regular rhythm.     Heart sounds: Normal heart sounds. No murmur. No friction rub. No gallop.   Pulmonary:     Effort: Pulmonary effort is normal. No respiratory distress.     Breath sounds: Normal breath sounds. No stridor. No wheezing, rhonchi or rales.  Skin:    General: Skin is warm and dry.  Neurological:     Mental Status: He is alert and oriented to person, place, and time.     Comments: Uses a wheelchair.    Dr. Tomie China notes reviewed Assessment  Lymphoma, need for central venous access Plan   Patient is scheduled for Port-A-Cath  insertion on 08/08/2019.  The risks and benefits of the procedure including bleeding, infection, and pneumothorax were fully explained to the patient, who gave informed consent.

## 2019-08-05 NOTE — Patient Instructions (Signed)

## 2019-08-05 NOTE — Progress Notes (Signed)
Location:    [facility] Room Number: 157/D Place of Service:  SNF (31)   CODE STATUS: Full Code  No Known Allergies  Chief Complaint  Patient presents with  . Hospitalization Follow-up    HPI:  Daniel Richards is a 73 year old man who has been hospitalized from 07-24-19 through 08-01-19. Daniel Richards presented to the ED with increased weakness. Daniel Richards as found to have an elevated calcium concerned for malignancy. Daniel Richards has low vit d level; Daniel Richards was found to have retroperitoneal and mediastinal lymphadenopathy is being treated for odynophagia.  Daniel Richards is here for short term rehab with his goal to return back home. Daniel Richards denies any uncontrolled pain; no changes in his appetite; no excessive thirst. Daniel Richards will continue to followed for his chronic illnesses including: diabetes hypertension; headaches.     Past Medical History:  Diagnosis Date  . Anxiety and depression   . Diabetes mellitus 06/09/2011   pt taking metformin  . DJD of shoulder    right   . Hyperlipidemia   . Hypertension   . Migraines   . Tremor   . Vitamin D deficiency     Past Surgical History:  Procedure Laterality Date  . BACK SURGERY    . KNEE SURGERY    . SHOULDER SURGERY      Social History   Socioeconomic History  . Marital status: Divorced    Spouse name: Not on file  . Number of children: 2  . Years of education: 12+  . Highest education level: Not on file  Occupational History  . Not on file  Social Needs  . Financial resource strain: Somewhat hard  . Food insecurity    Worry: Never true    Inability: Never true  . Transportation needs    Medical: No    Non-medical: No  Tobacco Use  . Smoking status: Never Smoker  . Smokeless tobacco: Never Used  Substance and Sexual Activity  . Alcohol use: No  . Drug use: No  . Sexual activity: Not Currently  Lifestyle  . Physical activity    Days per week: 0 days    Minutes per session: 0 min  . Stress: Not at all  Relationships  . Social Clinical research associate on phone: Never    Gets together: Twice a week    Attends religious service: More than 4 times per year    Active member of club or organization: No    Attends meetings of clubs or organizations: Never    Relationship status: Divorced  . Intimate partner violence    Fear of current or ex partner: No    Emotionally abused: No    Physically abused: No    Forced sexual activity: No  Other Topics Concern  . Not on file  Social History Narrative   Lives at home with son.   Caffeine use: Drinks 1 cup coffee (3 cups per week-decaf)   Family History  Problem Relation Age of Onset  . Cancer Sister   . Cancer Sister   . Cancer Brother   . Heart attack Father   . Cancer Brother   . Heart attack Brother   . Healthy Son   . Healthy Daughter   . Migraines Neg Hx       VITAL SIGNS BP 103/68   Pulse 67   Temp 97.8 F (36.6 C) (Oral)   Resp 18   Ht 6' (1.829 m)   Wt 189 lb 9.6 oz (86  kg)   BMI 25.71 kg/m   Outpatient Encounter Medications as of 08/05/2019  Medication Sig  . amLODipine (NORVASC) 5 MG tablet Take 1 tablet (5 mg total) by mouth daily.  Roseanne Kaufman [place]-Castor Oil (VENELEX) OINT Apply to bilateral buttocks and sacrum qshift & prn for prevention. Every Shift Day, Evening, Night  . calcitRIOL (ROCALTROL) 0.5 MCG capsule Take 1 capsule by mouth daily.  . citalopram (CELEXA) 20 MG tablet Take 20 mg by mouth at bedtime.  . feeding supplement, ENSURE ENLIVE, (ENSURE ENLIVE) LIQD Take 237 mLs by mouth 2 (two) times daily between meals.  . folic acid (FOLVITE) 1 MG tablet Take 1 tablet (1 mg total) by mouth daily.  Marland Kitchen gabapentin (NEURONTIN) 300 MG capsule Take 600 mg by mouth 3 (three) times daily.   Marland Kitchen HYDROcodone-acetaminophen (NORCO) 10-325 MG tablet Take 1 tablet by mouth every 4 (four) hours as needed for severe pain.  . INVOKANA 100 MG TABS tablet Take 1 tablet by mouth daily.  . metFORMIN (GLUCOPHAGE) 500 MG tablet Take 1 tablet by mouth daily.  . NON FORMULARY  Diet: Regular, NAS, Consistent Carbohydrate  . pantoprazole (PROTONIX) 40 MG tablet Take 1 tablet (40 mg total) by mouth 2 (two) times daily before a meal.  . primidone (MYSOLINE) 50 MG tablet Take 1.5 tablets (75 mg total) by mouth every 8 (eight) hours.  . propranolol (INDERAL) 40 MG tablet Take 40 mg by mouth 2 (two) times daily.  . sucralfate (CARAFATE) 1 GM/10ML suspension Take 10 mLs (1 g total) by mouth 4 (four) times daily -  with meals and at bedtime.  . tamsulosin (FLOMAX) 0.4 MG CAPS capsule Take 0.4 mg by mouth daily. Give 30 minutes after same meal daily. Do not open/crush/chew  . topiramate (TOPAMAX) 100 MG tablet Take 200 mg by mouth at bedtime.  . vitamin B-12 1000 MCG tablet Take 1 tablet (1,000 mcg total) by mouth daily.   No facility-administered encounter medications on file as of 08/05/2019.      SIGNIFICANT DIAGNOSTIC EXAMS  TODAY;   07-24-19: left hip x-ray: No acute osseous abnormality   07-24-19: acute abdomen x-ray: Negative abdominal radiographs.  No acute cardiopulmonary disease.   07-24-19: ct of head and cervical spine: Mild chronic ischemic white matter disease. No acute intracranial abnormality seen.   07-24-19: ct of abdomen and pelvis:  Postsurgical and degenerative changes are noted in the cervical spine. No fracture or significant spondylolisthesis is noted.1. There is bulky retroperitoneal and left pelvic lymphadenopathy that has developed since the prior CT. Adenopathy may occlude the  left common and/or external iliac vein. Differential diagnosis includes lymphoma and other lymphoproliferative disorders. Consider the possibility of prostate carcinoma metastatic to lymph nodes. 2. No acute findings within the abdomen or pelvis. 3. Bilateral intrarenal stones. Dependent bladder stones as well as a small amount of dependent bladder debris. 4. Bilateral renal cortical thinning. 5. Aortic atherosclerosis.  07-25-19: renal ultrasound:  No acute abnormality.    Negative for hydronephrosis. Bilateral nonobstructing renal stones, more numerous on the left. Distended urinary bladder  07-25-19: ct of chest:  1. There is an enlarged paraesophageal lymph node measuring 2.0 x 1.4 cm at the level of T6-T7 (series 2, image 71). There are prominent periaortic and retrocrural nodes lower in the thorax. 2. There are enlarged left lower cervical/superior mediastinal lymph nodes measuring up to 1.2 x 1.0 cm (series 2, image 7). 3.  Coronary artery disease.  Aortic Atherosclerosis   07-30-19: MRI lumbar spine:  Massive retroperitoneal lymphadenopathy. No evidence of marrow space tumor or fracture.     Previous right hemilaminectomy at L5-S1. Right lateral recess stenosis because of protruding disc material and scarring could possibly affect the right S1 nerve. Appearance is unchanged since 2018.  07-30-19: mri of thoracic spine:  No significant thoracic spinal abnormality. No significant degenerative disease, stenosis or nerve compression. No evidence of fracture or bone marrow lesion. Paraesophageal lymph node as shown by CT.  LABS REVIEWED TODAY;  07-24-19: wbc 4.0; hgb 10.1; hct 32.2; mcv 104.9; plt 201; glucose 113; bun 24; creat 1.64; k+ 3.9; na++ 138; ca 10.8; liver normal albumin 33; vit B 12; 125; folate 5; iron 38; tibc 214; ferritin 373; hgb a1c 5.6 07-25-19: PSA 3.08 07-29-19: PTH 14; ca 10.8 08-01-19: glucose 110; bun 22; creat 1.32; k+ 3.7; na++ 137; ca 8.4; liver normal albumin 2.5; vit C 10.0    Review of Systems  Constitutional: Negative for malaise/fatigue.  Respiratory: Negative for cough and shortness of breath.   Cardiovascular: Negative for chest pain, palpitations and leg swelling.  Gastrointestinal: Negative for abdominal pain, constipation and heartburn.  Musculoskeletal: Positive for back pain. Negative for joint pain and myalgias.       Chronic and managed   Skin: Negative.   Neurological: Negative for dizziness.   Psychiatric/Behavioral: The patient is not nervous/anxious.    Physical Exam Constitutional:      General: Daniel Richards is not in acute distress.    Appearance: Daniel Richards is well-developed. Daniel Richards is not diaphoretic.  Neck:     Musculoskeletal: Neck supple.     Thyroid: No thyromegaly.  Cardiovascular:     Rate and Rhythm: Normal rate and regular rhythm.     Pulses: Normal pulses.     Heart sounds: Normal heart sounds.  Pulmonary:     Effort: Pulmonary effort is normal. No respiratory distress.     Breath sounds: Normal breath sounds.  Abdominal:     General: Bowel sounds are normal. There is no distension.     Palpations: Abdomen is soft.     Tenderness: There is no abdominal tenderness.  Musculoskeletal: Normal range of motion.     Right lower leg: No edema.     Left lower leg: No edema.  Lymphadenopathy:     Cervical: No cervical adenopathy.  Skin:    General: Skin is warm and dry.  Neurological:     Mental Status: Daniel Richards is alert and oriented to person, place, and time.      ASSESSMENT/ PLAN:  TODAY  1. Essential tremor: is stable will continue primidone 75 mg every 8 hours; inderal 40 mg twice daily   2. Type 2 diabetes controlled; with peripheral neuropathy: is stable hgb a1c 5.6; will continue invokana 100 mg daily metformin 500 mg daily will monitor   3. Diabetic peripheral neuropathy: is stable will continue neurontin 600 mg three times daily and has vicodin 10/325 mg every 4 hours as needed   4. Odynophagia: is stable will continue protonix 40 mg twice daily and carafate 1 gm four times daily   5. Hypertension associated with type 2 diabetes mellitus: is stable b/p 103/68 will continue norvasc 5 mg daily   6. 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 Folate/vit B12 deficiency: stable will continue folic acid and vit [id number] supplements  8. Anemia of chronic disease: stable hgb 10.1 monitor  9. Major depression  recurrent chronic: is stable  will continue celexa 20 mg daily   10. Chronic intractable headache unspecified type: stable will continue topamax 100 gm daily   11. BPH without urinary obstruction: stable will continue floma 0.4 mg daily   12. Aortic atherosclerosis is stable will monitor     Ok Edwards NP [facility]  Contact [phone] Monday through Friday 8am- 5pm  After hours call [phone]

## 2019-08-05 NOTE — Progress Notes (Signed)
Per Dr. Delton Coombes, I requested high-grade lymphoma FISH panel on accession # [id number].

## 2019-08-05 NOTE — H&P (Signed)
Daniel Richards; [id number]; 1946/03/24   HPI Patient is a 73 year old white male who was referred to my care by Dr. Delton Coombes for Port-A-Cath placement.  He has lymphoma and is about to undergo chemotherapy.  He needs central venous access.  He has 4 out of 10 body aches.  He denies any fever or chills. Past Medical History:  Diagnosis Date  . Anxiety and depression   . Diabetes mellitus 06/09/2011   pt taking metformin  . DJD of shoulder    right   . Hyperlipidemia   . Hypertension   . Migraines   . Tremor   . Vitamin D deficiency     Past Surgical History:  Procedure Laterality Date  . BACK SURGERY    . KNEE SURGERY    . SHOULDER SURGERY      Family History  Problem Relation Age of Onset  . Cancer Sister   . Cancer Sister   . Cancer Brother   . Heart attack Father   . Cancer Brother   . Heart attack Brother   . Healthy Son   . Healthy Daughter   . Migraines Neg Hx     Current Outpatient Medications on File Prior to Visit  Medication Sig Dispense Refill  . amLODipine (NORVASC) 5 MG tablet Take 1 tablet (5 mg total) by mouth daily.    . calcitRIOL (ROCALTROL) 0.5 MCG capsule Take 1 capsule by mouth daily.    . citalopram (CELEXA) 20 MG tablet Take 20 mg by mouth at bedtime.    . folic acid (FOLVITE) 1 MG tablet Take 1 tablet (1 mg total) by mouth daily.    Marland Kitchen gabapentin (NEURONTIN) 300 MG capsule Take 600 mg by mouth 3 (three) times daily.     Marland Kitchen HYDROcodone-acetaminophen (NORCO) 10-325 MG tablet Take 1 tablet by mouth every 4 (four) hours as needed for severe pain. 12 tablet 0  . INVOKANA 100 MG TABS tablet Take 1 tablet by mouth daily.    . metFORMIN (GLUCOPHAGE) 500 MG tablet Take 1 tablet by mouth daily.    . pantoprazole (PROTONIX) 40 MG tablet Take 1 tablet (40 mg total) by mouth 2 (two) times daily before a meal.    . primidone (MYSOLINE) 50 MG tablet Take 1.5 tablets (75 mg total) by mouth every 8 (eight) hours. 30 tablet 3  . propranolol (INDERAL) 40 MG tablet  Take 40 mg by mouth 2 (two) times daily.    . sucralfate (CARAFATE) 1 GM/10ML suspension Take 10 mLs (1 g total) by mouth 4 (four) times daily -  with meals and at bedtime. 420 mL 0  . topiramate (TOPAMAX) 100 MG tablet Take 200 mg by mouth at bedtime.    . vitamin B-12 1000 MCG tablet Take 1 tablet (1,000 mcg total) by mouth daily.     No current facility-administered medications on file prior to visit.     No Known Allergies  Social History   Substance and Sexual Activity  Alcohol Use No    Social History   Tobacco Use  Smoking Status Never Smoker  Smokeless Tobacco Never Used    Review of Systems  Constitutional: Positive for malaise/fatigue.  HENT: Negative.   Eyes: Negative.   Respiratory: Negative.   Cardiovascular: Negative.   Gastrointestinal: Positive for abdominal pain.  Genitourinary: Positive for urgency.  Musculoskeletal: Positive for back pain.  Skin: Negative.   Neurological: Positive for tremors.  Endo/Heme/Allergies: Negative.   Psychiatric/Behavioral: Negative.     Objective  Vitals:   08/05/19 1337  BP: 120/70  Pulse: 69  Resp: 18  Temp: 98 F (36.7 C)  SpO2: 96%    Physical Exam Vitals signs reviewed.  Constitutional:      Appearance: Normal appearance. He is not ill-appearing.  HENT:     Head: Normocephalic and atraumatic.  Cardiovascular:     Rate and Rhythm: Normal rate and regular rhythm.     Heart sounds: Normal heart sounds. No murmur. No friction rub. No gallop.   Pulmonary:     Effort: Pulmonary effort is normal. No respiratory distress.     Breath sounds: Normal breath sounds. No stridor. No wheezing, rhonchi or rales.  Skin:    General: Skin is warm and dry.  Neurological:     Mental Status: He is alert and oriented to person, place, and time.     Comments: Uses a wheelchair.    Dr. Tomie China notes reviewed Assessment  Lymphoma, need for central venous access Plan   Patient is scheduled for Port-A-Cath  insertion on 08/08/2019.  The risks and benefits of the procedure including bleeding, infection, and pneumothorax were fully explained to the patient, who gave informed consent.

## 2019-08-05 NOTE — Progress Notes (Signed)
*  PRELIMINARY RESULTS* Echocardiogram 2D Echocardiogram has been performed.  Daniel Richards 08/05/2019, 11:41 AM

## 2019-08-06 ENCOUNTER — Encounter: Payer: Self-pay | Admitting: Adult Health

## 2019-08-06 ENCOUNTER — Non-Acute Institutional Stay (SKILLED_NURSING_FACILITY): Payer: Medicare Other | Admitting: Adult Health

## 2019-08-06 ENCOUNTER — Encounter (HOSPITAL_COMMUNITY)
Admission: RE | Admit: 2019-08-06 | Discharge: 2019-08-06 | Disposition: A | Discharge status: Home / Self Care | Payer: Medicare Other | Source: Ambulatory Visit | Attending: General Surgery | Admitting: General Surgery

## 2019-08-06 ENCOUNTER — Other Ambulatory Visit: Payer: Self-pay

## 2019-08-06 ENCOUNTER — Other Ambulatory Visit: Payer: Self-pay | Admitting: Adult Health

## 2019-08-06 DIAGNOSIS — C8510 Unspecified B-cell lymphoma, unspecified site: Secondary | ICD-10-CM

## 2019-08-06 DIAGNOSIS — R590 Localized enlarged lymph nodes: Secondary | ICD-10-CM | POA: Diagnosis not present

## 2019-08-06 MED ORDER — METFORMIN HCL 500 MG PO TABS: 500.0000 mg | ORAL_TABLET | Freq: Every day | ORAL | 0 refills | Status: DC

## 2019-08-06 MED ORDER — HYDROCODONE-ACETAMINOPHEN 10-325 MG PO TABS: 1.0000 | ORAL_TABLET | ORAL | 0 refills | Status: DC | PRN

## 2019-08-06 MED ORDER — PROPRANOLOL HCL 40 MG PO TABS: 40.0000 mg | ORAL_TABLET | Freq: Two times a day (BID) | ORAL | 0 refills | Status: DC

## 2019-08-06 MED ORDER — AMLODIPINE BESYLATE 5 MG PO TABS: 5.0000 mg | ORAL_TABLET | Freq: Every day | ORAL | 0 refills | Status: DC

## 2019-08-06 MED ORDER — CITALOPRAM HYDROBROMIDE 20 MG PO TABS: 20.0000 mg | ORAL_TABLET | Freq: Every day | ORAL | 0 refills | Status: DC

## 2019-08-06 MED ORDER — FOLIC ACID 1 MG PO TABS: 1.0000 mg | ORAL_TABLET | Freq: Every day | ORAL | 0 refills | Status: DC

## 2019-08-06 MED ORDER — PRIMIDONE 50 MG PO TABS: 75.0000 mg | ORAL_TABLET | Freq: Three times a day (TID) | ORAL | 0 refills | Status: AC

## 2019-08-06 MED ORDER — TOPIRAMATE 100 MG PO TABS: 200.0000 mg | ORAL_TABLET | Freq: Every day | ORAL | 0 refills | Status: DC

## 2019-08-06 MED ORDER — SUCRALFATE 1 GM/10ML PO SUSP: 1.0000 g | Freq: Three times a day (TID) | ORAL | 0 refills | Status: DC

## 2019-08-06 MED ORDER — CALCITRIOL 0.5 MCG PO CAPS: 0.5000 ug | ORAL_CAPSULE | Freq: Every day | ORAL | 0 refills | Status: DC

## 2019-08-06 MED ORDER — TAMSULOSIN HCL 0.4 MG PO CAPS: 0.4000 mg | ORAL_CAPSULE | Freq: Every day | ORAL | 0 refills | Status: DC

## 2019-08-06 MED ORDER — INVOKANA 100 MG PO TABS: 100.0000 mg | ORAL_TABLET | Freq: Every day | ORAL | 0 refills | Status: DC

## 2019-08-06 MED ORDER — GABAPENTIN 300 MG PO CAPS: 600.0000 mg | ORAL_CAPSULE | Freq: Three times a day (TID) | ORAL | 0 refills | Status: DC

## 2019-08-06 MED ORDER — PANTOPRAZOLE SODIUM 40 MG PO TBEC: 40.0000 mg | DELAYED_RELEASE_TABLET | Freq: Two times a day (BID) | ORAL | 0 refills | Status: DC

## 2019-08-06 NOTE — Pre-Procedure Instructions (Signed)
Spoke with daughter, [name] [phone], who states she does not have POA yet, "that is something I need to finish". She is however his next of kin and has been signing his paperwork and helping him make decisions about his care. She will be here on Friday 08/08/2019 and asks to be present when anesthesia and surgeon are talking to patient due to her not being sure how much Mr Register ca comprehend and respond at times. Abbie Sons, RN coordinator notified.

## 2019-08-06 NOTE — Progress Notes (Addendum)
Location:    [facility] Room Number: 157/D Place of Service:  SNF (31)    CODE STATUS: Full Code  No Known Allergies  Chief Complaint  Patient presents with  . Discharge Note    Discharge Visit    HPI:  He is being discharged to home with home health for pt/ot. He will need a wheelchair and a semi-electric bed. He will need his prescriptions written and will need to follow up with his medical provider. 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 He was admitted to this facility for short term rehab. There are positive covid patient in the facility. He and his wife feel that it is best that he go home due to his upcoming chemotherapy and increased risk for infection.    Past Medical History:  Diagnosis Date  . Anxiety and depression   . Diabetes mellitus 06/09/2011   pt taking metformin  . DJD of shoulder    right   . Hyperlipidemia   . Hypertension   . Migraines   . Tremor   . Vitamin D deficiency     Past Surgical History:  Procedure Laterality Date  . BACK SURGERY    . KNEE SURGERY    . SHOULDER SURGERY      Social History   Socioeconomic History  . Marital status: Divorced    Spouse name: Not on file  . Number of children: 2  . Years of education: 12+  . Highest education level: Not on file  Occupational History  . Not on file  Social Needs  . Financial resource strain: Somewhat hard  . Food insecurity    Worry: Never true    Inability: Never true  . Transportation needs    Medical: No    Non-medical: No  Tobacco Use  . Smoking status: Never Smoker  . Smokeless tobacco: Never Used  Substance and Sexual Activity  . Alcohol use: No  . Drug use: No  . Sexual activity: Not Currently  Lifestyle  . Physical activity    Days per week: 0 days    Minutes per session: 0 min  . Stress: Not at all  Relationships  . Social Herbalist on phone: Never    Gets together: Twice a week    Attends  religious service: More than 4 times per year    Active member of club or organization: No    Attends meetings of clubs or organizations: Never    Relationship status: Divorced  . Intimate partner violence    Fear of current or ex partner: No    Emotionally abused: No    Physically abused: No    Forced sexual activity: No  Other Topics Concern  . Not on file  Social History Narrative   Lives at home with son.   Caffeine use: Drinks 1 cup coffee (3 cups per week-decaf)   Family History  Problem Relation Age of Onset  . Cancer Sister   . Cancer Sister   . Cancer Brother   . Heart attack Father   . Cancer Brother   . Heart attack Brother   . Healthy Son   . Healthy Daughter   . Migraines Neg Hx     VITAL SIGNS BP 109/70   Pulse 68   Temp (!) 97 F (36.1 C) (Oral)   Resp 20   Ht 6' (1.829 m)   Wt 189 lb 9.6 oz (86 kg)  BMI 25.71 kg/m   Patient's Medications  New Prescriptions   No medications on file  Previous Medications   BALSAM PERU-CASTOR OIL (VENELEX) OINT    Apply to bilateral buttocks and sacrum qshift & prn for prevention. Every Shift Day, Evening, Night   NON FORMULARY    Diet: Regular, NAS, Consistent Carbohydrate   VITAMIN B-12 1000 MCG TABLET    Take 1 tablet (1,000 mcg total) by mouth daily.  Modified Medications   Modified Medication Previous Medication   AMLODIPINE (NORVASC) 5 MG TABLET amLODipine (NORVASC) 5 MG tablet      Take 1 tablet (5 mg total) by mouth daily.    Take 1 tablet (5 mg total) by mouth daily.   CALCITRIOL (ROCALTROL) 0.5 MCG CAPSULE calcitRIOL (ROCALTROL) 0.5 MCG capsule      Take 1 capsule (0.5 mcg total) by mouth daily.    Take 1 capsule by mouth daily.   CITALOPRAM (CELEXA) 20 MG TABLET citalopram (CELEXA) 20 MG tablet      Take 1 tablet (20 mg total) by mouth at bedtime.    Take 20 mg by mouth at bedtime.   FOLIC ACID (FOLVITE) 1 MG TABLET folic acid (FOLVITE) 1 MG tablet      Take 1 tablet (1 mg total) by mouth daily.     Take 1 tablet (1 mg total) by mouth daily.   GABAPENTIN (NEURONTIN) 300 MG CAPSULE gabapentin (NEURONTIN) 300 MG capsule      Take 2 capsules (600 mg total) by mouth 3 (three) times daily.    Take 600 mg by mouth 3 (three) times daily.    HYDROCODONE-ACETAMINOPHEN (NORCO) 10-325 MG TABLET HYDROcodone-acetaminophen (NORCO) 10-325 MG tablet      Take 1 tablet by mouth every 4 (four) hours as needed for up to 6 days for severe pain.    Take 1 tablet by mouth every 4 (four) hours as needed for up to 6 days for severe pain.   INVOKANA 100 MG TABS TABLET INVOKANA 100 MG TABS tablet      Take 1 tablet (100 mg total) by mouth daily.    Take 1 tablet by mouth daily.   METFORMIN (GLUCOPHAGE) 500 MG TABLET metFORMIN (GLUCOPHAGE) 500 MG tablet      Take 1 tablet (500 mg total) by mouth daily.    Take 1 tablet by mouth daily.   PANTOPRAZOLE (PROTONIX) 40 MG TABLET pantoprazole (PROTONIX) 40 MG tablet      Take 1 tablet (40 mg total) by mouth 2 (two) times daily before a meal.    Take 1 tablet (40 mg total) by mouth 2 (two) times daily before a meal.   PRIMIDONE (MYSOLINE) 50 MG TABLET primidone (MYSOLINE) 50 MG tablet      Take 1.5 tablets (75 mg total) by mouth every 8 (eight) hours.    Take 1.5 tablets (75 mg total) by mouth every 8 (eight) hours.   PROPRANOLOL (INDERAL) 40 MG TABLET propranolol (INDERAL) 40 MG tablet      Take 1 tablet (40 mg total) by mouth 2 (two) times daily.    Take 40 mg by mouth 2 (two) times daily.   SUCRALFATE (CARAFATE) 1 GM/10ML SUSPENSION sucralfate (CARAFATE) 1 GM/10ML suspension      Take 10 mLs (1 g total) by mouth 4 (four) times daily -  with meals and at bedtime.    Take 10 mLs (1 g total) by mouth 4 (four) times daily -  with meals and at bedtime.  TAMSULOSIN (FLOMAX) 0.4 MG CAPS CAPSULE tamsulosin (FLOMAX) 0.4 MG CAPS capsule      Take 1 capsule (0.4 mg total) by mouth daily. Give 30 minutes after same meal daily. Do not open/crush/chew    Take 0.4 mg by mouth daily. Give  30 minutes after same meal daily. Do not open/crush/chew   TOPIRAMATE (TOPAMAX) 100 MG TABLET topiramate (TOPAMAX) 100 MG tablet      Take 2 tablets (200 mg total) by mouth at bedtime.    Take 200 mg by mouth at bedtime.  Discontinued Medications   FEEDING SUPPLEMENT, ENSURE ENLIVE, (ENSURE ENLIVE) LIQD    Take 237 mLs by mouth 2 (two) times daily between meals.     SIGNIFICANT DIAGNOSTIC EXAMS   PREVIOUS ;   07-24-19: left hip x-ray: No acute osseous abnormality   07-24-19: acute abdomen x-ray: Negative abdominal radiographs.  No acute cardiopulmonary disease.   07-24-19: ct of head and cervical spine: Mild chronic ischemic white matter disease. No acute intracranial abnormality seen.   07-24-19: ct of abdomen and pelvis:  Postsurgical and degenerative changes are noted in the cervical spine. No fracture or significant spondylolisthesis is noted.1. There is bulky retroperitoneal and left pelvic lymphadenopathy that has developed since the prior CT. Adenopathy may occlude the  left common and/or external iliac vein. Differential diagnosis includes lymphoma and other lymphoproliferative disorders. Consider the possibility of prostate carcinoma metastatic to lymph nodes. 2. No acute findings within the abdomen or pelvis. 3. Bilateral intrarenal stones. Dependent bladder stones as well as a small amount of dependent bladder debris. 4. Bilateral renal cortical thinning. 5. Aortic atherosclerosis.  07-25-19: renal ultrasound:  No acute abnormality.   Negative for hydronephrosis. Bilateral nonobstructing renal stones, more numerous on the left. Distended urinary bladder  07-25-19: ct of chest:  1. There is an enlarged paraesophageal lymph node measuring 2.0 x 1.4 cm at the level of T6-T7 (series 2, image 71). There are prominent periaortic and retrocrural nodes lower in the thorax. 2. There are enlarged left lower cervical/superior mediastinal lymph nodes measuring up to 1.2 x 1.0 cm (series  2, image 7). 3.  Coronary artery disease.  Aortic Atherosclerosis   07-30-19: MRI lumbar spine:  Massive retroperitoneal lymphadenopathy. No evidence of marrow space tumor or fracture.     Previous right hemilaminectomy at L5-S1. Right lateral recess stenosis because of protruding disc material and scarring could possibly affect the right S1 nerve. Appearance is unchanged since 2018.  07-30-19: mri of thoracic spine:  No significant thoracic spinal abnormality. No significant degenerative disease, stenosis or nerve compression. No evidence of fracture or bone marrow lesion. Paraesophageal lymph node as shown by CT.  NO NEW EXAMS.   LABS REVIEWED PREVIOUS;  07-24-19: wbc 4.0; hgb 10.1; hct 32.2; mcv 104.9; plt 201; glucose 113; bun 24; creat 1.64; k+ 3.9; na++ 138; ca 10.8; liver normal albumin 33; vit B 12; 125; folate 5; iron 38; tibc 214; ferritin 373; hgb a1c 5.6 07-25-19: PSA 3.08 07-29-19: PTH 14; ca 10.8 08-01-19: glucose 110; bun 22; creat 1.32; k+ 3.7; na++ 137; ca 8.4; liver normal albumin 2.5; vit C 10.0   NO NEW EXAMS.   Review of Systems  Constitutional: Negative for malaise/fatigue.  Respiratory: Negative for cough and shortness of breath.   Cardiovascular: Negative for chest pain, palpitations and leg swelling.  Gastrointestinal: Negative for abdominal pain, constipation and heartburn.  Musculoskeletal: Negative for back pain, joint pain and myalgias.  Skin: Negative.   Neurological: Negative for dizziness.  Psychiatric/Behavioral: The patient is not nervous/anxious.      Physical Exam Constitutional:      General: He is not in acute distress.    Appearance: He is well-developed. He is not diaphoretic.  Neck:     Musculoskeletal: Neck supple.     Thyroid: No thyromegaly.  Cardiovascular:     Rate and Rhythm: Normal rate and regular rhythm.     Pulses: Normal pulses.     Heart sounds: Normal heart sounds.  Pulmonary:     Effort: Pulmonary effort is normal. No  respiratory distress.     Breath sounds: Normal breath sounds.  Abdominal:     General: Bowel sounds are normal. There is no distension.     Palpations: Abdomen is soft.     Tenderness: There is no abdominal tenderness.  Musculoskeletal: Normal range of motion.     Right lower leg: No edema.     Left lower leg: No edema.  Lymphadenopathy:     Cervical: No cervical adenopathy.  Skin:    General: Skin is warm and dry.  Neurological:     Mental Status: He is alert and oriented to person, place, and time.  Psychiatric:        Mood and Affect: Mood normal.        ASSESSMENT/ PLAN:   Patient is being discharged with the following home health services:  Pt/ot to evaluate and treat as indicated for gait balance strength adl training  Patient is being discharged with the following durable medical equipment:  Bsc. Light weight wheelchair due to poor activity tolerance  with elevated leg rest; cushion; anti-tippers; brake extensions to allow him to maintain his current level of independence with his adls which cannot be achieved with a walker; can self propel. Semi-electric bed for pain management due to his diagnosis of cancer requires him to make frequent position changes which cannot be done in a standard bed.   Patient has been advised to f/u with their PCP in 1-2 weeks to bring them up to date on their rehab stay.  Social services at facility was responsible for arranging this appointment.  Pt was provided with a 30 day supply of prescriptions for medications and refills must be obtained from their PCP.  For controlled substances, a more limited supply may be provided adequate until PCP appointment only.  A 30 day supply of his prescription medications with #12 vicodin 10/325 mg tabs to [facility]   Time spent with patient 45 minutes: dme; medications and home health.    Ok Edwards NP [facility]  Contact [phone] Monday through Friday 8am- 5pm  After hours  call [phone]

## 2019-08-07 ENCOUNTER — Encounter (HOSPITAL_COMMUNITY)
Admission: RE | Admit: 2019-08-07 | Discharge: 2019-08-07 | Disposition: A | Discharge status: Home / Self Care | Payer: Medicare Other | Source: Ambulatory Visit | Attending: Hematology | Admitting: Hematology

## 2019-08-07 DIAGNOSIS — C8590 Non-Hodgkin lymphoma, unspecified, unspecified site: Secondary | ICD-10-CM | POA: Diagnosis present

## 2019-08-07 DIAGNOSIS — D638 Anemia in other chronic diseases classified elsewhere: Secondary | ICD-10-CM | POA: Insufficient documentation

## 2019-08-07 DIAGNOSIS — N400 Enlarged prostate without lower urinary tract symptoms: Secondary | ICD-10-CM | POA: Diagnosis not present

## 2019-08-07 DIAGNOSIS — E1159 Type 2 diabetes mellitus with other circulatory complications: Secondary | ICD-10-CM | POA: Insufficient documentation

## 2019-08-07 DIAGNOSIS — J439 Emphysema, unspecified: Secondary | ICD-10-CM | POA: Diagnosis not present

## 2019-08-07 DIAGNOSIS — I700 Atherosclerosis of aorta: Secondary | ICD-10-CM | POA: Insufficient documentation

## 2019-08-07 DIAGNOSIS — N200 Calculus of kidney: Secondary | ICD-10-CM | POA: Diagnosis not present

## 2019-08-07 DIAGNOSIS — I152 Hypertension secondary to endocrine disorders: Secondary | ICD-10-CM | POA: Insufficient documentation

## 2019-08-07 DIAGNOSIS — E1142 Type 2 diabetes mellitus with diabetic polyneuropathy: Secondary | ICD-10-CM | POA: Insufficient documentation

## 2019-08-07 DIAGNOSIS — I2510 Atherosclerotic heart disease of native coronary artery without angina pectoris: Secondary | ICD-10-CM | POA: Diagnosis not present

## 2019-08-07 DIAGNOSIS — F339 Major depressive disorder, recurrent, unspecified: Secondary | ICD-10-CM | POA: Insufficient documentation

## 2019-08-07 LAB — GLUCOSE, CAPILLARY: Glucose-Capillary: 143 mg/dL — ABNORMAL HIGH (ref 70–99)

## 2019-08-07 MED ORDER — FLUDEOXYGLUCOSE F - 18 (FDG) INJECTION
9.4100 | Freq: Once | INTRAVENOUS | Status: AC | PRN
  Administered 2019-08-07: 9.41 via INTRAVENOUS

## 2019-08-08 ENCOUNTER — Ambulatory Visit (HOSPITAL_COMMUNITY): Payer: Medicare Other

## 2019-08-08 ENCOUNTER — Inpatient Hospital Stay
Admission: RE | Admit: 2019-08-08 | Discharge: 2019-08-08 | Disposition: A | Discharge status: Home / Self Care | Payer: Medicare Other | Source: Ambulatory Visit | Attending: Internal Medicine | Admitting: Internal Medicine

## 2019-08-08 ENCOUNTER — Ambulatory Visit (HOSPITAL_COMMUNITY): Payer: Medicare Other | Admitting: Anesthesiology

## 2019-08-08 ENCOUNTER — Ambulatory Visit (HOSPITAL_COMMUNITY)
Admission: RE | Admit: 2019-08-08 | Discharge: 2019-08-08 | Disposition: A | Discharge status: Home / Self Care | Payer: Medicare Other | Attending: General Surgery | Admitting: General Surgery

## 2019-08-08 ENCOUNTER — Encounter (HOSPITAL_COMMUNITY)
Admission: RE | Disposition: A | Discharge status: Home / Self Care | Payer: Self-pay | Source: Home / Self Care | Attending: General Surgery

## 2019-08-08 DIAGNOSIS — Z7984 Long term (current) use of oral hypoglycemic drugs: Secondary | ICD-10-CM | POA: Insufficient documentation

## 2019-08-08 DIAGNOSIS — I10 Essential (primary) hypertension: Secondary | ICD-10-CM | POA: Diagnosis not present

## 2019-08-08 DIAGNOSIS — F329 Major depressive disorder, single episode, unspecified: Secondary | ICD-10-CM | POA: Insufficient documentation

## 2019-08-08 DIAGNOSIS — Z79899 Other long term (current) drug therapy: Secondary | ICD-10-CM | POA: Diagnosis not present

## 2019-08-08 DIAGNOSIS — G43909 Migraine, unspecified, not intractable, without status migrainosus: Secondary | ICD-10-CM | POA: Diagnosis not present

## 2019-08-08 DIAGNOSIS — Z95828 Presence of other vascular implants and grafts: Secondary | ICD-10-CM

## 2019-08-08 DIAGNOSIS — C8590 Non-Hodgkin lymphoma, unspecified, unspecified site: Secondary | ICD-10-CM | POA: Insufficient documentation

## 2019-08-08 DIAGNOSIS — C8510 Unspecified B-cell lymphoma, unspecified site: Secondary | ICD-10-CM

## 2019-08-08 DIAGNOSIS — E119 Type 2 diabetes mellitus without complications: Secondary | ICD-10-CM | POA: Insufficient documentation

## 2019-08-08 HISTORY — PX: PORTACATH PLACEMENT: SHX2246

## 2019-08-08 LAB — GLUCOSE, CAPILLARY
Glucose-Capillary: 112 mg/dL — ABNORMAL HIGH (ref 70–99)
Glucose-Capillary: 134 mg/dL — ABNORMAL HIGH (ref 70–99)

## 2019-08-08 SURGERY — INSERTION, TUNNELED CENTRAL VENOUS DEVICE, WITH PORT
Anesthesia: Monitor Anesthesia Care | Site: Chest | Laterality: Left

## 2019-08-08 MED ORDER — LIDOCAINE HCL (PF) 1 % IJ SOLN
INTRAMUSCULAR | Status: AC
  Filled 2019-08-08: qty 30

## 2019-08-08 MED ORDER — PROMETHAZINE HCL 25 MG/ML IJ SOLN: 6.2500 mg | INTRAMUSCULAR | Status: DC | PRN

## 2019-08-08 MED ORDER — ONDANSETRON HCL 4 MG/2ML IJ SOLN
INTRAMUSCULAR | Status: AC
  Filled 2019-08-08: qty 2

## 2019-08-08 MED ORDER — PROPOFOL 500 MG/50ML IV EMUL
INTRAVENOUS | Status: DC | PRN
  Administered 2019-08-08: 100 ug/kg/min via INTRAVENOUS

## 2019-08-08 MED ORDER — VANCOMYCIN HCL IN DEXTROSE 1-5 GM/200ML-% IV SOLN
1000.0000 mg | INTRAVENOUS | Status: AC
  Administered 2019-08-08: 1000 mg via INTRAVENOUS

## 2019-08-08 MED ORDER — LACTATED RINGERS IV SOLN
INTRAVENOUS | Status: DC
  Administered 2019-08-08: 08:00:00 via INTRAVENOUS

## 2019-08-08 MED ORDER — ONDANSETRON HCL 4 MG/2ML IJ SOLN
INTRAMUSCULAR | Status: DC | PRN
  Administered 2019-08-08: 4 mg via INTRAVENOUS

## 2019-08-08 MED ORDER — CHLORHEXIDINE GLUCONATE CLOTH 2 % EX PADS: 6.0000 | MEDICATED_PAD | Freq: Once | CUTANEOUS | Status: DC

## 2019-08-08 MED ORDER — HEPARIN SOD (PORK) LOCK FLUSH 100 UNIT/ML IV SOLN
INTRAVENOUS | Status: DC | PRN
  Administered 2019-08-08: 500 [IU] via INTRAVENOUS

## 2019-08-08 MED ORDER — HYDROCODONE-ACETAMINOPHEN 7.5-325 MG PO TABS: 1.0000 | ORAL_TABLET | Freq: Once | ORAL | Status: DC | PRN

## 2019-08-08 MED ORDER — PROPOFOL 10 MG/ML IV BOLUS
INTRAVENOUS | Status: AC
  Filled 2019-08-08: qty 40

## 2019-08-08 MED ORDER — FENTANYL CITRATE (PF) 100 MCG/2ML IJ SOLN
INTRAMUSCULAR | Status: DC | PRN
  Administered 2019-08-08 (×2): 50 ug via INTRAVENOUS

## 2019-08-08 MED ORDER — DIPHENHYDRAMINE HCL 50 MG/ML IJ SOLN
25.0000 mg | Freq: Once | INTRAMUSCULAR | Status: AC
  Administered 2019-08-08: 25 mg via INTRAVENOUS

## 2019-08-08 MED ORDER — KETOROLAC TROMETHAMINE 30 MG/ML IJ SOLN
15.0000 mg | Freq: Once | INTRAMUSCULAR | Status: AC
  Administered 2019-08-08: 15 mg via INTRAVENOUS
  Filled 2019-08-08: qty 1

## 2019-08-08 MED ORDER — HYDROMORPHONE HCL 1 MG/ML IJ SOLN: 0.2500 mg | INTRAMUSCULAR | Status: DC | PRN

## 2019-08-08 MED ORDER — LIDOCAINE HCL (PF) 1 % IJ SOLN
INTRAMUSCULAR | Status: DC | PRN
  Administered 2019-08-08: 9 mL

## 2019-08-08 MED ORDER — MIDAZOLAM HCL 2 MG/2ML IJ SOLN: 0.5000 mg | Freq: Once | INTRAMUSCULAR | Status: DC | PRN

## 2019-08-08 MED ORDER — SODIUM CHLORIDE (PF) 0.9 % IJ SOLN
INTRAMUSCULAR | Status: DC | PRN
  Administered 2019-08-08: 50 mL via INTRAVENOUS

## 2019-08-08 MED ORDER — DIPHENHYDRAMINE HCL 50 MG/ML IJ SOLN
INTRAMUSCULAR | Status: AC
  Filled 2019-08-08: qty 1

## 2019-08-08 MED ORDER — HEPARIN SOD (PORK) LOCK FLUSH 100 UNIT/ML IV SOLN
INTRAVENOUS | Status: AC
  Filled 2019-08-08: qty 5

## 2019-08-08 MED ORDER — VANCOMYCIN HCL IN DEXTROSE 1-5 GM/200ML-% IV SOLN
INTRAVENOUS | Status: AC
  Filled 2019-08-08: qty 200

## 2019-08-08 MED ORDER — FENTANYL CITRATE (PF) 100 MCG/2ML IJ SOLN
INTRAMUSCULAR | Status: AC
  Filled 2019-08-08: qty 2

## 2019-08-08 SURGICAL SUPPLY — 31 items
ADH SKN CLS APL DERMABOND .7 (GAUZE/BANDAGES/DRESSINGS) ×1
APL PRP STRL LF ISPRP CHG 10.5 (MISCELLANEOUS) ×1
APPLICATOR CHLORAPREP 10.5 ORG (MISCELLANEOUS) ×3 IMPLANT
BAG DECANTER FOR FLEXI CONT (MISCELLANEOUS) ×3 IMPLANT
CLOTH BEACON ORANGE TIMEOUT ST (SAFETY) ×3 IMPLANT
COVER LIGHT HANDLE STERIS (MISCELLANEOUS) ×6 IMPLANT
COVER WAND RF STERILE (DRAPES) ×3 IMPLANT
DECANTER SPIKE VIAL GLASS SM (MISCELLANEOUS) ×3 IMPLANT
DERMABOND ADVANCED (GAUZE/BANDAGES/DRESSINGS) ×2
DERMABOND ADVANCED .7 DNX12 (GAUZE/BANDAGES/DRESSINGS) ×1 IMPLANT
DRAPE C-ARM FOLDED MOBILE STRL (DRAPES) ×3 IMPLANT
ELECT REM PT RETURN 9FT ADLT (ELECTROSURGICAL) ×3
ELECTRODE REM PT RTRN 9FT ADLT (ELECTROSURGICAL) ×1 IMPLANT
GLOVE BIOGEL PI IND STRL 7.0 (GLOVE) ×2 IMPLANT
GLOVE BIOGEL PI INDICATOR 7.0 (GLOVE) ×4
GLOVE SURG SS PI 7.5 STRL IVOR (GLOVE) ×3 IMPLANT
GOWN STRL REUS W/TWL LRG LVL3 (GOWN DISPOSABLE) ×6 IMPLANT
IV NS 500ML (IV SOLUTION) ×3
IV NS 500ML BAXH (IV SOLUTION) ×1 IMPLANT
KIT PORT POWER 8FR ISP MRI (Port) ×3 IMPLANT
KIT TURNOVER KIT A (KITS) ×3 IMPLANT
NDL HYPO 25X1 1.5 SAFETY (NEEDLE) ×1 IMPLANT
NEEDLE HYPO 25X1 1.5 SAFETY (NEEDLE) ×3 IMPLANT
PACK MINOR (CUSTOM PROCEDURE TRAY) ×3 IMPLANT
PAD ARMBOARD 7.5X6 YLW CONV (MISCELLANEOUS) ×3 IMPLANT
SET BASIN LINEN APH (SET/KITS/TRAYS/PACK) ×3 IMPLANT
SUT MNCRL AB 4-0 PS2 18 (SUTURE) ×3 IMPLANT
SUT VIC AB 3-0 SH 27 (SUTURE) ×3
SUT VIC AB 3-0 SH 27X BRD (SUTURE) ×1 IMPLANT
SYR 5ML LL (SYRINGE) ×3 IMPLANT
SYR CONTROL 10ML LL (SYRINGE) ×3 IMPLANT

## 2019-08-08 NOTE — Anesthesia Preprocedure Evaluation (Signed)
Anesthesia Evaluation  Patient identified by MRN, date of birth, ID band Patient awake    Reviewed: Allergy & Precautions, NPO status , Patient's Chart, lab work & pertinent test results  Airway Mallampati: II  TM Distance: >3 FB Neck ROM: Full    Dental no notable dental hx. (+) Edentulous Upper, Edentulous Lower   Pulmonary neg pulmonary ROS,    Pulmonary exam normal breath sounds clear to auscultation       Cardiovascular Exercise Tolerance: Poor hypertension, Pt. on medications negative cardio ROS Normal cardiovascular examII Rhythm:Regular Rate:Normal  Normal Echo -recent  Denies CP/MI/Stents  Diminished ET related to weakness   Neuro/Psych  Headaches, Anxiety Depression  Neuromuscular disease negative psych ROS   GI/Hepatic negative GI ROS, Neg liver ROS,   Endo/Other  negative endocrine ROSdiabetes, Type 2, Oral Hypoglycemic Agents  Renal/GU Renal InsufficiencyRenal disease  negative genitourinary   Musculoskeletal  (+) Arthritis , Osteoarthritis,    Abdominal   Peds negative pediatric ROS (+)  Hematology negative hematology ROS (+) anemia , lmyphoma here for port placement    Anesthesia Other Findings   Reproductive/Obstetrics negative OB ROS                             Anesthesia Physical Anesthesia Plan  ASA: III  Anesthesia Plan: MAC   Post-op Pain Management:    Induction: Intravenous  PONV Risk Score and Plan: 1 and TIVA, Propofol infusion, Midazolam and Treatment may vary due to age or medical condition  Airway Management Planned: Nasal Cannula and Simple Face Mask  Additional Equipment:   Intra-op Plan:   Post-operative Plan:   Informed Consent: I have reviewed the patients History and Physical, chart, labs and discussed the procedure including the risks, benefits and alternatives for the proposed anesthesia with the patient or authorized representative who  has indicated his/her understanding and acceptance.     Dental advisory given  Plan Discussed with: CRNA  Anesthesia Plan Comments: (Plan Full PPE use  Plan MAC with GETA as needed d/w pt -WTP with same after Q&A)        Anesthesia Quick Evaluation

## 2019-08-08 NOTE — Op Note (Signed)
Patient:  Daniel Richards  DOB:  Sep 03, 1946  MRN:  [id number]   Preop Diagnosis: Lymphoma, need for central venous access  Postop Diagnosis: Same  Procedure: Port-A-Cath insertion  Surgeon: Aviva Signs, MD  Anes: MAC  Indications: Patient is a 73 year old white male who is about to undergo chemotherapy for lymphoma.  The risks and benefits of the procedure including bleeding, infection, and pneumothorax were fully explained to the patient, who gave informed consent.  Procedure note: The patient was placed in the Trendelenburg position after the left upper chest was prepped and draped using usual sterile technique with ChloraPrep.  Surgical site confirmation was performed.  1% Xylocaine was used for local anesthesia.  An incision was made below the left clavicle.  A subcutaneous pocket was formed.  A needle was advanced into the left subclavian vein using the Seldinger technique without difficulty.  A guidewire was then advanced into the right atrium under fluoroscopic guidance.  An introducer and peel-away sheath were placed over the guidewire.  The catheter was inserted through the peel-away sheath and the peel-away sheath was removed.  The catheter was then attached to the port and the port placed in subcutaneous pocket.  Adequate positioning was confirmed by fluoroscopy.  Good backflow of venous blood was noted on aspiration of the port.  The port was flushed with heparin flush.  Subcutaneous layer was reapproximated using a 3-0 Vicryl interrupted suture.  The skin was closed using a 4-0 Monocryl subcuticular suture.  Dermabond was applied.  All tape and needle counts were correct at the end of the procedure.  The patient was awakened and transferred to PACU in stable condition.  A chest x-ray will be performed at that time.  Complications: None  EBL: Minimal  Specimen: None

## 2019-08-08 NOTE — Anesthesia Postprocedure Evaluation (Signed)
Anesthesia Post Note  Patient: Daniel Richards  Procedure(s) Performed: INSERTION PORT-A-CATH (Left )  Patient location during evaluation: PACU Anesthesia Type: MAC Level of consciousness: awake and alert, oriented and patient cooperative Pain management: pain level controlled Vital Signs Assessment: post-procedure vital signs reviewed and stable Respiratory status: spontaneous breathing, nonlabored ventilation and respiratory function stable Cardiovascular status: stable Postop Assessment: no apparent nausea or vomiting Anesthetic complications: no     Last Vitals:  Vitals:   08/08/19 0750  BP: 130/69  Pulse: 75  Resp: 18  Temp: 37 C  SpO2: 98%    Last Pain:  Vitals:   08/08/19 0750  PainSc: 5                  Cherly Erno

## 2019-08-08 NOTE — Interval H&P Note (Signed)
History and Physical Interval Note:  08/08/2019 8:22 AM  Daniel Richards  has presented today for surgery, with the diagnosis of LYMPHOMA.  The various methods of treatment have been discussed with the patient and family. After consideration of risks, benefits and other options for treatment, the patient has consented to  Procedure(s): INSERTION PORT-A-CATH (Left) as a surgical intervention.  The patient's history has been reviewed, patient examined, no change in status, stable for surgery.  I have reviewed the patient's chart and labs.  Questions were answered to the patient's satisfaction.     Aviva Signs

## 2019-08-08 NOTE — Transfer of Care (Signed)
Immediate Anesthesia Transfer of Care Note  Patient: Daniel Richards  Procedure(s) Performed: INSERTION PORT-A-CATH (Left )  Patient Location: PACU  Anesthesia Type:MAC  Level of Consciousness: awake, alert  and oriented  Airway & Oxygen Therapy: Patient Spontanous Breathing  Post-op Assessment: Report given to RN and Post -op Vital signs reviewed and stable  Post vital signs: Reviewed and stable  Last Vitals:  Vitals Value Taken Time  BP    Temp    Pulse 79 08/08/19 0937  Resp    SpO2 93 % 08/08/19 0937  Vitals shown include unvalidated device data.  Last Pain:  Vitals:   08/08/19 0750  PainSc: 5          Complications: No apparent anesthesia complications

## 2019-08-08 NOTE — Discharge Instructions (Signed)
Implanted Port Home Guide °An implanted port is a device that is placed under the skin. It is usually placed in the chest. The device can be used to give IV medicine, to take blood, or for dialysis. You may have an implanted port if: °· You need IV medicine that would be irritating to the small veins in your hands or arms. °· You need IV medicines, such as antibiotics, for a long period of time. °· You need IV nutrition for a long period of time. °· You need dialysis. °Having a port means that your health care provider will not need to use the veins in your arms for these procedures. You may have fewer limitations when using a port than you would if you used other types of long-term IVs, and you will likely be able to return to normal activities after your incision heals. °An implanted port has two main parts: °· Reservoir. The reservoir is the part where a needle is inserted to give medicines or draw blood. The reservoir is round. After it is placed, it appears as a small, raised area under your skin. °· Catheter. The catheter is a thin, flexible tube that connects the reservoir to a vein. Medicine that is inserted into the reservoir goes into the catheter and then into the vein. °How is my port accessed? °To access your port: °· A numbing cream may be placed on the skin over the port site. °· Your health care provider will put on a mask and sterile gloves. °· The skin over your port will be cleaned carefully with a germ-killing soap and allowed to dry. °· Your health care provider will gently pinch the port and insert a needle into it. °· Your health care provider will check for a blood return to make sure the port is in the vein and is not clogged. °· If your port needs to remain accessed to get medicine continuously (constant infusion), your health care provider will place a clear bandage (dressing) over the needle site. The dressing and needle will need to be changed every week, or as told by your health care  provider. °What is flushing? °Flushing helps keep the port from getting clogged. Follow instructions from your health care provider about how and when to flush the port. Ports are usually flushed with saline solution or a medicine called heparin. The need for flushing will depend on how the port is used: °· If the port is only used from time to time to give medicines or draw blood, the port may need to be flushed: °? Before and after medicines have been given. °? Before and after blood has been drawn. °? As part of routine maintenance. Flushing may be recommended every 4-6 weeks. °· If a constant infusion is running, the port may not need to be flushed. °· Throw away any syringes in a disposal container that is meant for sharp items (sharps container). You can buy a sharps container from a pharmacy, or you can make one by using an empty hard plastic bottle with a cover. °How long will my port stay implanted? °The port can stay in for as long as your health care provider thinks it is needed. When it is time for the port to come out, a surgery will be done to remove it. The surgery will be similar to the procedure that was done to put the port in. °Follow these instructions at home: ° °· Flush your port as told by your health care provider. °·   If you need an infusion over several days, follow instructions from your health care provider about how to take care of your port site. Make sure you: °? Wash your hands with soap and water before you change your dressing. If soap and water are not available, use alcohol-based hand sanitizer. °? Change your dressing as told by your health care provider. °? Place any used dressings or infusion bags into a plastic bag. Throw that bag in the trash. °? Keep the dressing that covers the needle clean and dry. Do not get it wet. °? Do not use scissors or sharp objects near the tube. °? Keep the tube clamped, unless it is being used. °· Check your port site every day for signs of  infection. Check for: °? Redness, swelling, or pain. °? Fluid or blood. °? Pus or a bad smell. °· Protect the skin around the port site. °? Avoid wearing bra straps that rub or irritate the site. °? Protect the skin around your port from seat belts. Place a soft pad over your chest if needed. °· Bathe or shower as told by your health care provider. The site may get wet as long as you are not actively receiving an infusion. °· Return to your normal activities as told by your health care provider. Ask your health care provider what activities are safe for you. °· Carry a medical alert card or wear a medical alert bracelet at all times. This will let health care providers know that you have an implanted port in case of an emergency. °Get help right away if: °· You have redness, swelling, or pain at the port site. °· You have fluid or blood coming from your port site. °· You have pus or a bad smell coming from the port site. °· You have a fever. °Summary °· Implanted ports are usually placed in the chest for long-term IV access. °· Follow instructions from your health care provider about flushing the port and changing bandages (dressings). °· Take care of the area around your port by avoiding clothing that puts pressure on the area, and by watching for signs of infection. °· Protect the skin around your port from seat belts. Place a soft pad over your chest if needed. °· Get help right away if you have a fever or you have redness, swelling, pain, drainage, or a bad smell at the port site. °This information is not intended to replace advice given to you by your health care provider. Make sure you discuss any questions you have with your health care provider. °Document Released: 10/23/2005 Document Revised: 02/14/2019 Document Reviewed: 11/25/2016 °Elsevier Patient Education © 2020 [employer] ° ° ° °Monitored Anesthesia Care, Care After °These instructions provide you with information about caring for yourself after  your procedure. Your health care provider may also give you more specific instructions. Your treatment has been planned according to current medical practices, but problems sometimes occur. Call your health care provider if you have any problems or questions after your procedure. °What can I expect after the procedure? °After your procedure, you may: °· Feel sleepy for several hours. °· Feel clumsy and have poor balance for several hours. °· Feel forgetful about what happened after the procedure. °· Have poor judgment for several hours. °· Feel nauseous or vomit. °· Have a sore throat if you had a breathing tube during the procedure. °Follow these instructions at home: °For at least 24 hours after the procedure: ° °  ° °· Have   a responsible adult stay with you. It is important to have someone help care for you until you are awake and alert. °· Rest as needed. °· Do not: °? Participate in activities in which you could fall or become injured. °? Drive. °? Use heavy machinery. °? Drink alcohol. °? Take sleeping pills or medicines that cause drowsiness. °? Make important decisions or sign legal documents. °? Take care of children on your own. °Eating and drinking °· Follow the diet that is recommended by your health care provider. °· If you vomit, drink water, juice, or soup when you can drink without vomiting. °· Make sure you have little or no nausea before eating solid foods. °General instructions °· Take over-the-counter and prescription medicines only as told by your health care provider. °· If you have sleep apnea, surgery and certain medicines can increase your risk for breathing problems. Follow instructions from your health care provider about wearing your sleep device: °? Anytime you are sleeping, including during daytime naps. °? While taking prescription pain medicines, sleeping medicines, or medicines that make you drowsy. °· If you smoke, do not smoke without supervision. °· Keep all follow-up visits as  told by your health care provider. This is important. °Contact a health care provider if: °· You keep feeling nauseous or you keep vomiting. °· You feel light-headed. °· You develop a rash. °· You have a fever. °Get help right away if: °· You have trouble breathing. °Summary °· For several hours after your procedure, you may feel sleepy and have poor judgment. °· Have a responsible adult stay with you for at least 24 hours or until you are awake and alert. °This information is not intended to replace advice given to you by your health care provider. Make sure you discuss any questions you have with your health care provider. °Document Released: 02/13/2016 Document Revised: 01/21/2018 Document Reviewed: 02/13/2016 °Elsevier Patient Education © 2020 [employer] Inc[employer] ° °

## 2019-08-11 ENCOUNTER — Encounter (HOSPITAL_COMMUNITY): Payer: Self-pay | Admitting: General Surgery

## 2019-08-11 ENCOUNTER — Other Ambulatory Visit: Payer: Self-pay

## 2019-08-11 ENCOUNTER — Inpatient Hospital Stay (HOSPITAL_COMMUNITY): Payer: Medicare Other | Attending: Hematology | Admitting: Hematology

## 2019-08-11 VITALS — BP 97/56 | HR 70 | Temp 97.3°F | Resp 18 | Wt 185.0 lb

## 2019-08-11 DIAGNOSIS — E1142 Type 2 diabetes mellitus with diabetic polyneuropathy: Secondary | ICD-10-CM | POA: Insufficient documentation

## 2019-08-11 DIAGNOSIS — I10 Essential (primary) hypertension: Secondary | ICD-10-CM | POA: Diagnosis not present

## 2019-08-11 DIAGNOSIS — E538 Deficiency of other specified B group vitamins: Secondary | ICD-10-CM | POA: Diagnosis not present

## 2019-08-11 DIAGNOSIS — C8590 Non-Hodgkin lymphoma, unspecified, unspecified site: Secondary | ICD-10-CM | POA: Diagnosis not present

## 2019-08-11 DIAGNOSIS — F418 Other specified anxiety disorders: Secondary | ICD-10-CM | POA: Insufficient documentation

## 2019-08-11 DIAGNOSIS — E559 Vitamin D deficiency, unspecified: Secondary | ICD-10-CM | POA: Diagnosis not present

## 2019-08-11 DIAGNOSIS — R531 Weakness: Secondary | ICD-10-CM | POA: Insufficient documentation

## 2019-08-11 DIAGNOSIS — R251 Tremor, unspecified: Secondary | ICD-10-CM | POA: Diagnosis not present

## 2019-08-11 DIAGNOSIS — R197 Diarrhea, unspecified: Secondary | ICD-10-CM | POA: Insufficient documentation

## 2019-08-11 DIAGNOSIS — Z5111 Encounter for antineoplastic chemotherapy: Secondary | ICD-10-CM | POA: Diagnosis present

## 2019-08-11 DIAGNOSIS — Z7984 Long term (current) use of oral hypoglycemic drugs: Secondary | ICD-10-CM | POA: Insufficient documentation

## 2019-08-11 DIAGNOSIS — I700 Atherosclerosis of aorta: Secondary | ICD-10-CM | POA: Insufficient documentation

## 2019-08-11 DIAGNOSIS — Z79899 Other long term (current) drug therapy: Secondary | ICD-10-CM | POA: Insufficient documentation

## 2019-08-11 DIAGNOSIS — Z5112 Encounter for antineoplastic immunotherapy: Secondary | ICD-10-CM | POA: Insufficient documentation

## 2019-08-11 DIAGNOSIS — R400 Somnolence: Secondary | ICD-10-CM | POA: Diagnosis not present

## 2019-08-11 DIAGNOSIS — E785 Hyperlipidemia, unspecified: Secondary | ICD-10-CM | POA: Diagnosis not present

## 2019-08-11 DIAGNOSIS — C8513 Unspecified B-cell lymphoma, intra-abdominal lymph nodes: Secondary | ICD-10-CM | POA: Diagnosis not present

## 2019-08-11 DIAGNOSIS — R1084 Generalized abdominal pain: Secondary | ICD-10-CM | POA: Diagnosis not present

## 2019-08-11 MED ORDER — TEMAZEPAM 15 MG PO CAPS: 15.0000 mg | ORAL_CAPSULE | Freq: Every evening | ORAL | 0 refills | Status: DC | PRN

## 2019-08-11 MED ORDER — ALLOPURINOL 300 MG PO TABS: 300.0000 mg | ORAL_TABLET | Freq: Every day | ORAL | 2 refills | Status: DC

## 2019-08-11 NOTE — Progress Notes (Signed)
START ON PATHWAY REGIMEN - Lymphoma and CLL     A cycle is every 21 days:     Prednisone      Rituximab-xxxx      Cyclophosphamide      Doxorubicin      Vincristine   **Always confirm dose/schedule in your pharmacy ordering system**  Patient Characteristics: Diffuse Large B-Cell Lymphoma or Follicular Lymphoma, Grade 3B, First Line, Stage III and IV Disease Type: Not Applicable Disease Type: Diffuse Large B-Cell Lymphoma Disease Type: Not Applicable Line of therapy: First Line [place] Stage: IV Intent of Therapy: Curative Intent, Discussed with Patient 

## 2019-08-11 NOTE — Assessment & Plan Note (Addendum)
1.  Stage IV high-grade B-cell lymphoma: -34-monthhistory of generalized weakness, no B symptoms. -CTAP showed retroperitoneal, left pelvic, external iliac adenopathy, largest lymph node measuring 7.5 cm.  LDH was normal. -MRI of the thoracic and lumbar spine did not show any spinal involvement. - IR biopsy of the left retroperitoneal lymph node on 07/30/2019, consistent with high-grade lymphoma.  Ki-67 was 40-50%.  Positive for CD20, CD5 DM, BCL-2, BCL 6.  Negative for CD10, CD3. -PET scan on 08/07/2019 showed lymphoma within nodal stations of the neck, chest, abdomen and pelvis.  Heterogeneous foci of marrow hypermetabolism consistent with lymphomatous involvement.  Right sphenoid sinus soft tissue density and hypermetabolism. - He does not have any sinusitis symptoms.  Hence I have recommended lumbar puncture with the spinal fluid for flow cytometry, cell count and cytology. -2D echo on 08/05/2019 shows EF of 65 to 70%. - He is at home with his daughter.  He is walking with the help of walker.  Functional status improved since discharge.  He is also doing physical therapy at home. - I have discussed the findings on the PET CT scan.  I have recommended R CHOP based chemotherapy. -We have sent high risk lymphoma panel which is pending at this time.  I will plan to treat him next week. -We will consider rasburicase for TLS prophylaxis.  He will also need growth factor support with Neulasta. - We will send a prescription for allopurinol which she will start taking daily.  Uric acid today was 4.3.  2.  Hypercalcemia: -Had hypercalcemia of 10.6 with albumin of 3, received Zometa on 07/28/2019. -Calcium normalized subsequently.  3.  BV33and folic acid deficiency: - At presentation he had low levels.  He will continue oral supplements.  4.  Sleep problems: - We will start him on Restoril 15 mg and see if it helps.

## 2019-08-11 NOTE — Patient Instructions (Signed)
[place] at [facility] Discharge Instructions  You were seen today by Dr. Delton Coombes. He went over your recent scan results. He will schedule you for a lumbar puncture. He will see you back on Wednesday for labs, treatment and follow up.   Thank you for choosing [place] at [facility] to provide your oncology and hematology care.  To afford each patient quality time with our provider, please arrive at least 15 minutes before your scheduled appointment time.   If you have a lab appointment with the [facility] please come in thru the  Main Entrance and check in at the main information desk  You need to re-schedule your appointment should you arrive 10 or more minutes late.  We strive to give you quality time with our providers, and arriving late affects you and other patients whose appointments are after yours.  Also, if you no show three or more times for appointments you may be dismissed from the clinic at the providers discretion.     Again, thank you for choosing [facility].  Our hope is that these requests will decrease the amount of time that you wait before being seen by our physicians.       _____________________________________________________________  Should you have questions after your visit to [facility], please contact our office at [phone] between the hours of 8:00 a.m. and 4:30 p.m.  Voicemails left after 4:00 p.m. will not be returned until the following business day.  For prescription refill requests, have your pharmacy contact our office and allow 72 hours.    Cancer Center Support Programs:   > Cancer Support Group  2nd Tuesday of the month 1pm-2pm, Journey Room

## 2019-08-11 NOTE — Progress Notes (Signed)
[address] [place], [place] [address]   CLINIC:  Medical Oncology/Hematology  PCP:  [name], [place] [facility] [address] [place] [address] [phone]   REASON FOR VISIT:  Follow-up for lymphoma.  CURRENT THERAPY: Under work-up.   INTERVAL HISTORY:  Daniel Richards 73 y.o. male seen for follow-up of lymphoma.  He had undergone PET CT scan.  He also underwent 2D echocardiogram.  He complains of difficulty falling asleep.  He is at his daughter's home now.  He is doing home physical therapy.  He walks with the help of walker.  Appetite and energy levels are low.  He reports achiness all over the body.  He had 1 or 2 episodes of diarrhea.  Overall functional status is reported to be improving since discharge from the hospital.    REVIEW OF SYSTEMS:  Review of Systems  Gastrointestinal: Positive for diarrhea.  Psychiatric/Behavioral: Positive for sleep disturbance.  All other systems reviewed and are negative.    PAST MEDICAL/SURGICAL HISTORY:  Past Medical History:  Diagnosis Date   Anxiety and depression    Diabetes mellitus 06/09/2011   pt taking metformin   DJD of shoulder    right    Hyperlipidemia    Hypertension    Migraines    Tremor    Vitamin D deficiency    Past Surgical History:  Procedure Laterality Date   BACK SURGERY     KNEE SURGERY     PORTACATH PLACEMENT Left 08/08/2019   Procedure: INSERTION PORT-A-CATH;  Surgeon: Aviva Signs, MD;  Location: [facility];  Service: General;  Laterality: Left;   SHOULDER SURGERY       SOCIAL HISTORY:  Social History   Socioeconomic History   Marital status: Divorced    Spouse name: Not on file   Number of children: 2   Years of education: 12+   Highest education level: Not on file  Occupational History   Not on file  Social Needs   Financial resource strain: Somewhat hard   Food insecurity    Worry: Never  true    Inability: Never true   Transportation needs    Medical: No    Non-medical: No  Tobacco Use   Smoking status: Never Smoker   Smokeless tobacco: Never Used  Substance and Sexual Activity   Alcohol use: No   Drug use: No   Sexual activity: Not Currently  Lifestyle   Physical activity    Days per week: 0 days    Minutes per session: 0 min   Stress: Not at all  Relationships   Social connections    Talks on phone: Never    Gets together: Twice a week    Attends religious service: More than 4 times per year    Active member of club or organization: No    Attends meetings of clubs or organizations: Never    Relationship status: Divorced   Intimate partner violence    Fear of current or ex partner: No    Emotionally abused: No    Physically abused: No    Forced sexual activity: No  Other Topics Concern   Not on file  Social History Narrative   Lives at home with son.   Caffeine use: Drinks 1 cup coffee (3 cups per week-decaf)    FAMILY HISTORY:  Family History  Problem Relation Age of Onset   Cancer Sister  Cancer Sister    Cancer Brother    Heart attack Father    Cancer Brother    Heart attack Brother    Healthy Son    Healthy Daughter    Migraines Neg Hx     CURRENT MEDICATIONS:  Outpatient Encounter Medications as of 08/11/2019  Medication Sig   amLODipine (NORVASC) 5 MG tablet Take 1 tablet (5 mg total) by mouth daily.   calcitRIOL (ROCALTROL) 0.5 MCG capsule Take 1 capsule (0.5 mcg total) by mouth daily.   citalopram (CELEXA) 20 MG tablet Take 1 tablet (20 mg total) by mouth at bedtime.   folic acid (FOLVITE) 1 MG tablet Take 1 tablet (1 mg total) by mouth daily.   gabapentin (NEURONTIN) 300 MG capsule Take 2 capsules (600 mg total) by mouth 3 (three) times daily.   INVOKANA 100 MG TABS tablet Take 1 tablet (100 mg total) by mouth daily.   metFORMIN (GLUCOPHAGE) 500 MG tablet Take 1 tablet (500 mg total) by mouth daily.    NON FORMULARY Diet: Regular, NAS, Consistent Carbohydrate   pantoprazole (PROTONIX) 40 MG tablet Take 1 tablet (40 mg total) by mouth 2 (two) times daily before a meal.   primidone (MYSOLINE) 50 MG tablet Take 1.5 tablets (75 mg total) by mouth every 8 (eight) hours.   propranolol (INDERAL) 40 MG tablet Take 1 tablet (40 mg total) by mouth 2 (two) times daily.   sucralfate (CARAFATE) 1 GM/10ML suspension Take 10 mLs (1 g total) by mouth 4 (four) times daily -  with meals and at bedtime.   tamsulosin (FLOMAX) 0.4 MG CAPS capsule Take 1 capsule (0.4 mg total) by mouth daily. Give 30 minutes after same meal daily. Do not open/crush/chew   topiramate (TOPAMAX) 100 MG tablet Take 2 tablets (200 mg total) by mouth at bedtime.   vitamin B-12 1000 MCG tablet Take 1 tablet (1,000 mcg total) by mouth daily.   allopurinol (ZYLOPRIM) 300 MG tablet Take 1 tablet (300 mg total) by mouth daily.   temazepam (RESTORIL) 15 MG capsule Take 1 capsule (15 mg total) by mouth at bedtime as needed for sleep.   [DISCONTINUED] Balsam [place]-Castor Oil (VENELEX) OINT Apply to bilateral buttocks and sacrum qshift & prn for prevention. Every Shift Day, Evening, Night   [DISCONTINUED] HYDROcodone-acetaminophen (NORCO) 10-325 MG tablet Take 1 tablet by mouth every 4 (four) hours as needed for up to 6 days for severe pain.   No facility-administered encounter medications on file as of 08/11/2019.     ALLERGIES:  No Known Allergies   PHYSICAL EXAM:  ECOG Performance status: 2  Vitals:   08/11/19 1152  BP: (!) 97/56  Pulse: 70  Resp: 18  Temp: (!) 97.3 F (36.3 C)  SpO2: 100%   Filed Weights   08/11/19 1152  Weight: 185 lb (83.9 kg)    Physical Exam Vitals signs reviewed.  Constitutional:      Appearance: Normal appearance.  Cardiovascular:     Rate and Rhythm: Normal rate and regular rhythm.     Heart sounds: Normal heart sounds.  Pulmonary:     Effort: Pulmonary effort is normal.     Breath  sounds: Normal breath sounds.  Abdominal:     General: There is no distension.     Palpations: Abdomen is soft. There is no mass.  Musculoskeletal:        General: No swelling.  Skin:    General: Skin is warm.  Neurological:     General: No  focal deficit present.     Mental Status: He is alert and oriented to person, place, and time.  Psychiatric:        Mood and Affect: Mood normal.        Behavior: Behavior normal.      LABORATORY DATA:  I have reviewed the labs as listed.  CBC    Component Value Date/Time   WBC 3.5 (L) 07/29/2019 0630   RBC 2.97 (L) 07/29/2019 0630   HGB 9.6 (L) 07/29/2019 0630   HCT 30.9 (L) 07/29/2019 0630   PLT 236 07/29/2019 0630   MCV 104.0 (H) 07/29/2019 0630   MCH 32.3 07/29/2019 0630   MCHC 31.1 07/29/2019 0630   RDW 15.9 (H) 07/29/2019 0630   LYMPHSABS 1.3 07/24/2019 1551   MONOABS 0.9 07/24/2019 1551   EOSABS 0.0 07/24/2019 1551   BASOSABS 0.0 07/24/2019 1551   CMP Latest Ref Rng & Units 08/01/2019 07/31/2019 07/29/2019  Glucose 70 - 99 mg/dL 110(H) 88 137(H)  BUN 8 - 23 mg/dL 22 23 17   Creatinine 0.61 - 1.24 mg/dL 1.32(H) 1.36(H) 1.51(H)  Sodium 135 - 145 mmol/L 137 138 139  Potassium 3.5 - 5.1 mmol/L 3.7 4.1 4.3  Chloride 98 - 111 mmol/L 111 110 108  CO2 22 - 32 mmol/L 20(L) 22 27  Calcium 8.9 - 10.3 mg/dL 8.4(L) 9.2 10.1  Total Protein 6.5 - 8.1 g/dL 5.9(L) 6.1(L) -  Total Bilirubin 0.3 - 1.2 mg/dL 0.1(L) 0.3 -  Alkaline Phos 38 - 126 U/L 60 57 -  AST 15 - 41 U/L 25 34 -  ALT 0 - 44 U/L 28 38 -       DIAGNOSTIC IMAGING:  I have independently reviewed the scans and discussed with the patient.     ASSESSMENT & PLAN:   B-cell lymphoma ([place]) 1.  Stage IV high-grade B-cell lymphoma: -38-monthhistory of generalized weakness, no B symptoms. -CTAP showed retroperitoneal, left pelvic, external iliac adenopathy, largest lymph node measuring 7.5 cm.  LDH was normal. -MRI of the thoracic and lumbar spine did not show any spinal  involvement. - IR biopsy of the left retroperitoneal lymph node on 07/30/2019, consistent with high-grade lymphoma.  Ki-67 was 40-50%.  Positive for CD20, CD5 DM, BCL-2, BCL 6.  Negative for CD10, CD3. -PET scan on 08/07/2019 showed lymphoma within nodal stations of the neck, chest, abdomen and pelvis.  Heterogeneous foci of marrow hypermetabolism consistent with lymphomatous involvement.  Right sphenoid sinus soft tissue density and hypermetabolism. - He does not have any sinusitis symptoms.  Hence I have recommended lumbar puncture with the spinal fluid for flow cytometry, cell count and cytology. -2D echo on 08/05/2019 shows EF of 65 to 70%. - He is at home with his daughter.  He is walking with the help of walker.  Functional status improved since discharge.  He is also doing physical therapy at home. - I have discussed the findings on the PET CT scan.  I have recommended R CHOP based chemotherapy. -We have sent high risk lymphoma panel which is pending at this time.  I will plan to treat him next week. -We will consider rasburicase for TLS prophylaxis.  He will also need growth factor support with Neulasta. - We will send a prescription for allopurinol which she will start taking daily.  Uric acid today was 4.3.  2.  Hypercalcemia: -Had hypercalcemia of 10.6 with albumin of 3, received Zometa on 07/28/2019. -Calcium normalized subsequently.  3.  BF41and folic acid  deficiency: - At presentation he had low levels.  He will continue oral supplements.   Total time spent is 40 minutes with more than 50% of the time spent face-to-face discussing new diagnosis, further work-up, treatment plan, counseling and coordination of care.  Orders placed this encounter:  No orders of the defined types were placed in this encounter.     Derek Jack, MD [facility] [phone]

## 2019-08-12 ENCOUNTER — Other Ambulatory Visit (HOSPITAL_COMMUNITY): Payer: Self-pay | Admitting: *Deleted

## 2019-08-12 DIAGNOSIS — C8510 Unspecified B-cell lymphoma, unspecified site: Secondary | ICD-10-CM

## 2019-08-13 ENCOUNTER — Ambulatory Visit (HOSPITAL_COMMUNITY): Admission: RE | Admit: 2019-08-13 | Payer: Medicare Other | Source: Ambulatory Visit

## 2019-08-13 ENCOUNTER — Encounter (HOSPITAL_COMMUNITY): Payer: Self-pay

## 2019-08-13 DIAGNOSIS — Z95828 Presence of other vascular implants and grafts: Secondary | ICD-10-CM | POA: Insufficient documentation

## 2019-08-13 HISTORY — DX: Presence of other vascular implants and grafts: Z95.828

## 2019-08-13 MED ORDER — LIDOCAINE-PRILOCAINE 2.5-2.5 % EX CREA: TOPICAL_CREAM | CUTANEOUS | 3 refills | Status: DC

## 2019-08-13 MED ORDER — PREDNISONE 20 MG PO TABS: 60.0000 mg | ORAL_TABLET | Freq: Every day | ORAL | 0 refills | Status: DC

## 2019-08-13 MED ORDER — PROCHLORPERAZINE MALEATE 10 MG PO TABS: 10.0000 mg | ORAL_TABLET | Freq: Four times a day (QID) | ORAL | 6 refills | Status: DC | PRN

## 2019-08-13 NOTE — Patient Instructions (Addendum)
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  You will be treated every 21 days with a combination of chemotherapy and immunotherapy drugs.  The intent of this treatment regimen is cure.  You will see the doctor regularly throughout treatment. We will obtain blood work from you prior to every treatment and monitor your results to make sure it is safe to give your treatment. The doctor monitors your response to treatment by the way you are feeling, your blood work, and by obtaining scans periodically.  There will be wait times while you are here for treatment.  It will take about 30 minutes to 1 hour for your lab work to result.  Then there will be wait times while pharmacy mixes your medications.   R-CHOP is the acronym for the combination of chemotherapy drugs and other drugs you will receive for your treatment.  The medicines this regimen includes are:  Rituxan (rituximab); Cytoxan (cyclophosphamide); Oncovin (vincristine); Adriamycin (doxorubicin HCl); and prednisone (not a chemo drug).  Medications you will receive in the clinic prior to your chemotherapy medications:  Aloxi:  ALOXI is used in adults to help prevent nausea and vomiting that happens with certain chemotherapy drugs.  Aloxi is a long acting medication, and will remain in your system for about two days.   Emend:  This is an anti-nausea medication that is used with Aloxi to help prevent nausea and vomiting caused by chemotherapy.  Dexamethasone:  This is a steroid given prior to chemotherapy to help prevent allergic reactions; it may also help prevent and control nausea and diarrhea.   Tylenol:  Given to prevent infusion reactions to rituximab.  Benadryl:  Antihistamine given to prevent allergic/infusion reactions to rituximab.   Rasburicase (Elitek):  Rasburicase is used to prevent and treat high uric acid levels due to cancer and cancer treatment. It is given in the vein  (IV).   Doxorubicin (Generic Name) Other Names: Adriamycin, hydroxyl daunorubicin  About This Drug  Doxorubicin is a drug used to treat cancer. This drug is given in the vein (IV).  This is an IV push that will take about 5-10 minutes.  Possible Side Effects (More Common)  . Bone marrow depression. This is a decrease in the number of white blood cells, red blood cells, and platelets. This may raise your risk of infection, make you tired and weak (fatigue), and raise your risk of bleeding.  . Hair loss: Hair loss is often complete scalp hair loss and can involve loss of eyebrows, eyelashes, and pubic hair. You may notice this a few days or weeks after treatment has started. Most often hair loss is temporary; your hair should grow back when treatment is done.  . Nausea and throwing up (vomiting). These symptoms may happen within a few hours after your treatment and may last up to 24 hours. Medicines are available to stop or lessen these side effects.  . Soreness of the mouth and throat. You may have red areas, white patches, or sores that hurt.  . Change in the color of your urine to pink or red. This color change will go away in one to two days.  . Effects on the heart: This drug can weaken the heart and lower heart function. Your heart function will be checked as needed. You may have trouble catching your breath, mainly during activities.  You may also have trouble breathing while lying down, and have swelling in your ankles.  Marland Kitchen  Sensitivity to light (photosensitivity). Photosensitivity means that you may become more sensitive to the effects of the sun, sun lamps, and tanning beds. Your eyes may water more, mostly in bright light.  . Metallic taste in the mouth: This may change the taste of food and drinks  . Decreased appetite (decreased hunger)  . Darkening of the skin or nails  . Weakness that interferes with your daily activities   Possible Side Effects (Less Common)  . Skin  and tissue irritation may involve redness, pain, warmth, or swelling at the IV site. This happens if the drug leaks out of the vein and into nearby tissue.  . Changes in your liver function. Your doctor will check your liver function as needed.  . This drug may cause an increased risk of developing a second cancer  Allergic Reaction  Serious allergic reactions, including anaphylaxis are rare. While you are getting this drug in your vein (IV), tell your nurse right away if you have any of these symptoms of an allergic reaction:  . Trouble catching your breath  . Feeling like your tongue or throat are swelling  . Feeling your heart beat quickly or in a not normal way (palpitations)  . Feeling dizzy or lightheaded  . Flushing, itching, rash, and/or hives  Treating Side Effects  . Drink 6-8 cups of fluids every day unless your doctor has told you to limit your fluid intake due to some other health problem. A cup is 8 ounces of fluid. If you vomit or have diarrhea, you should drink more fluids so that you do not become dehydrated (lack water in the body due to losing too much fluid).  . Ask your doctor or nurse about medicine that is available to help stop or lessen nausea, throwing up, and/or loose bowel movements  . Wear dark sunglasses and use sunscreen with SPF 30 or higher when you are outdoors even for a short time. Cover up when you are out in the sun. Wear wide-brimmed hats, long-sleeved shirts, and pants. Keep your neck, chest, and back covered.  . Mouth care is very important. Your mouth care should consist of routine, gentle cleaning of your teeth or dentures and rinsing your mouth with a mixture of 1/2 teaspoon of salt in 8 ounces of water or  teaspoon of baking soda in 8 ounces of water. This should be done at least after each meal and at bedtime.  . If you have mouth sores, avoid mouthwash that has alcohol. Avoid alcohol and smoking because they can bother your mouth and  throat.  . Talk with your nurse about getting a wig before you lose your hair. Also, call the [facility] at 800-ACS-2345 to find out information about the "Look Good, Feel Better" program close to where you live. It is a free program where women getting chemotherapy can learn about wigs, turbans and scarves as well as makeup techniques and skin and nail care.  . While you are getting this drug, please tell your nurse right away if you have any pain, redness, or swelling at the site of the IV infusion.  Food and Drug Interactions  There are no known interactions of doxorubicin with food. This drug may interact with other medicines. Tell your doctor and pharmacist about all the medicines and dietary supplements (vitamins, minerals, herbs and others) that you are taking at this time. The safety and use of dietary supplements and alternative diets are often not known. Using these might affect your  cancer or interfere with your treatment. Until more is known, you should not use dietary supplements or alternative diets without your cancer doctor's help.  When to Call the Doctor  Call your doctor or nurse right away if you have any of these symptoms:  . Fever of 100.5 F (38 C) or above  . Chills  . Easy bruising or bleeding  . Wheezing or trouble breathing  . Rash or itching  . Feeling dizzy or lightheaded  . Feeling that your heart is beating in a fast or not normal way (palpitations)  . Loose bowel movements (diarrhea) more than 4 times a day or diarrhea with weakness or feeling lightheaded  . Nausea that stops you from eating or drinking  . Throwing up more than 3 times a day  . Signs of liver problems: dark urine, pale bowel movements, bad stomach pain, feeling very tired and weak, unusual itching, or yellowing of the eyes or skin,  . During the IV infusion, if you have pain, redness, or swelling at the site of the IV infusion, please tell your nurse right away  Call  your doctor or nurse as soon as possible if any of these symptoms happen:  . Decreased urine  . Pain in your mouth or throat that makes it hard to eat or drink  . Nausea and throwing up that is not relieved by prescribed medicines  . Rash that is not relieved by prescribed medicines  . Swelling of legs, ankles, or feet  . Weight gain of 5 pounds in one week (fluid retention)  . Lasting loss of appetite or rapid weight loss of five pounds in a week  . Fatigue that interferes with your daily activities  . Extreme weakness that interferes with normal activities  Sexual Problems and Reproduction Concerns  . Infertility warning: Sexual problems and reproduction concerns may happen. In both men and women, this drug may affect your ability to have children. This cannot be determined before your treatment. Talk with your doctor or nurse if you plan to have children. Ask for information on sperm or egg banking.  . In men, this drug may interfere with your ability to make sperm, but it should not change your ability to have sexual relations.  . In women, menstrual bleeding may become irregular or stop while you are getting this drug. Do not assume that you cannot become pregnant if you do not have a menstrual period.  . Women may go through signs of menopause (change of life) like vaginal dryness or itching. Vaginal lubricants can be used to lessen vaginal dryness, itching, and pain during sexual relations.  . Genetic counseling is available for you to talk about the effects of this drug therapy on future pregnancies. Also, a genetic counselor can look at the possible risk of problems in the unborn baby due to this medicine if an exposure happens during pregnancy.  . Pregnancy warning: This drug may have harmful effects on the unborn baby, so effective methods of birth control should be used by you and your partner during your cancer treatment and for at least 6 months after treatment  .  Breast feeding warning: Women should not breast feed during treatment because this drug could enter the breast milk and badly harm a breast feeding baby.   Vincristine sulfate (Generic Name) Other Names: Oncovin, Vincasar, VCR  About This Drug  Vincristine is a drug used to treat cancer. It is given in the vein (IV).  This drug  will take 10 minutes to infuse.  Possible Side Effects (More Common)  . Constipation (not able to move bowels)  . Hair loss. You may notice hair getting thin. Some patients lose their hair. Your hair often grows back when treatment is done. Most often hair loss is temporary; your hair should grow back when treatment is done.  . Effects on the nerves are called peripheral neuropathy. You may feel numbness, tingling, or pain in your hands and feet. It may be hard for you to button your clothes, open jars, or walk as usual. The effect on the nerves may get worse with more doses of the drug. These effects get better in some people after the drug is stopped but it does not get better in all people.   Possible Side Effects (Less Common)  . Soreness of the mouth and throat. You may have red areas, white patches, or sores that hurt.  . Swelling of your legs, ankles, and/or feet  . Skin and tissue irritation may involve redness, pain, warmth, or swelling at the IV site. This happens if the drug leaks out of the vein and into nearby tissue.  . Blood pressure changes. Some patients have low blood pressure and some patients have high blood pressure.  . Jaw pain  . Hoarseness  . Blurred or double vision or other changes in eyesight may happen but are a rare side effect.  . Feeling dizzy or lightheaded  . Drooping eyelids are a rare side effect.  . Depression  . Rash  . Bone marrow depression. This is a decrease in the number of white blood cells, red blood cells, and platelets. This may raise your risk of infection, make you tired and weak (fatigue), and raise  your risk of bleeding.  . Chest pain or symptoms of a heart attack. Most heart attacks involve pain in the center of the chest that lasts more than a few minutes. The pain may go away and come back or it can be constant. It can feel like pressure, squeezing, fullness, or pain. Sometimes pain is felt in one or both arms, the back, neck, jaw, or stomach. If any of these symptoms last 2 minutes, call 911.  . Trouble sleeping  . Lack of appetite; weight loss  Allergic Reactions  Serious allergic reactions including anaphylaxis are rare. While you are getting this drug in your vein (IV), tell your nurse right away if you have any of these symptoms of an allergic reaction:  . Trouble catching your breath  . Feeling like your tongue or throat are swelling  . Feeling your heart beat quickly or in a not normal way (palpitations)  . Feeling dizzy or lightheaded  . Flushing, itching, rash, and/or hives  Treating Side Effects  . While you are getting this drug in your IV, please tell your nurse right away if you have any pain, redness, or swelling at the site of the IV infusion.  . Ask your doctor or nurse how to prevent or lessen constipation. Constipation can become a serious side effect. If you are constipated, check with your doctor or nurse before you use enemas, laxatives, or suppositories.  . Drink 6-8 cups of fluids each day unless your doctor has told you to limit your fluid intake due to some other health problem. A cup is 8 ounces of fluid. If you throw up or have loose bowel movements, you should drink more fluids so that you do not become dehydrated (lack water in  the body from losing too much fluid).  . Mouth care is very important. Your mouth care should consist of gently cleaning your teeth or dentures and rinsing your mouth with a mixture of  teaspoon salt in 8 ounces of water or  teaspoon sodium bicarbonate (baking soda) in 8 ounces of water. This should be done at least after  each meal and at bedtime.  . If you have mouth sores, avoid mouthwash that has alcohol. Also avoid alcohol and smoking because they can bother your mouth and throat.  . If you get a rash do not put anything on it unless your doctor or nurse says you may. Keep the area around the rash clean and dry. Ask your doctor for medicine if your rash bothers you.  . If you have numbness and tingling in your hands and feet, be careful when cooking, walking, and handling sharp objects and hot liquids.  . Talk with your nurse about getting a wig before you lose your hair. Also, call the [facility] at 800-ACS-2345 to find out information about the "Look Good, Feel Better" program close to where you live. It is a free program where women getting chemotherapy can learn about wigs, turbans and scarves as well as makeup techniques and skin and nail care.  Food and Drug Interactions  . There are known interactions of Vincristine with grapefruit. Do not eat grapefruit or drink grapefruit juice while taking this drug.  . Check with your doctor or pharmacist about all other prescription medicines and dietary supplements you are taking before starting this medicine as there are a lot of known drug interactions with Vincristine. Also, check with your doctor or pharmacist before starting any new prescription, over-the-counter medicines, or dietary supplement to make sure that there are no interactions.  When to Call the Doctor  Call your doctor or nurse right away if you have any of these symptoms:  . Redness, pain, warmth, or swelling at the IV site while you are getting this drug  . No bowel movement in 3 days or when you feel uncomfortable.  . Abdominal pain  . Fever of 100.5 F (38 C) or higher  . Chills  . Feeling short of breath  . Easy bleeding or bruising  . Jaw pain  . Hoarseness  . Drooping eyelids  . Blurred vision or other changes in eyesight  . Feeling confused, restless, or  irritable  . Seizures (Common symptoms of a seizure can include confusion, blacking out, passing out, loss of hearing or vision, blurred vision, unusual smells or tastes (such as burning rubber), trouble talking, tremors or shaking in parts or all of the body, repeated body movements, tense muscles that do not relax, and loss of control of urine and bowels. There are other less common symptoms of seizures. If you or your family member suspects you are having a seizure, call 911 right away).  . Hallucinations (seeing, hearing, or feeling things that are not really there)  . Feeling  . Rash  . Chest pain or symptoms of a heart attack. Most heart attacks involve pain in the center of the chest that lasts more than a few minutes. The pain may go away and come back. It can feel like pressure, squeezing, fullness, or pain. Sometimes pain is felt in one or both arms, the back, neck, jaw, or stomach. If any of these symptoms last 2 minutes, call 911.  Call your doctor or nurse as soon as possible if you  have any of these symptoms:  . Pain in fingers, toes, joints, or testicles  . Numbness, tingling, or decreased feeling or weakness in fingers, toes, arms, or legs  . Trouble walking or changes in the way you walk  . Swelling of your legs, ankles, and/or feet  . Headache  . Pain in your mouth or throat that makes it hard to eat or drink  . Loss of appetite or weight loss  . Hearing changes  . Depression  . Trouble sleeping  Reproduction Concerns  . Women may go through signs of menopause (change of life) like vaginal dryness or itching. Vaginal lubricants can be used to lessen vaginal dryness, itching, and pain during sexual relations.  . Pregnancy warning: This drug may have harmful effects on the unborn child, so effective methods of birth control should be used during your cancer treatment.  . Breast feeding warning: It is not known if this drug passes into breast milk. For this  reason, women should talk to their doctor about the risks and benefits of breast feeding during treatment with this drug because this drug may enter the breast milk and badly harm a breast feeding baby.   Cyclophosphamide (Generic Name) Other Names: Cytoxan, Neosar  About This Drug  Cyclophosphamide is a drug used to treat cancer. It is given in the vein (IV) or by mouth. This drug takes 30 minutes to infuse.  Possible Side Effects (More Common)  . Nausea and throwing up (vomiting). These symptoms may happen within a few hours after your treatment and may last up to 72 hours. Medicines are available to stop or lessen these side effects.  . Bone marrow depression. This is a decrease in the number of white blood cells, red blood cells, and platelets. This may raise your risk of infection, make you tired and weak (fatigue), and raise your risk of bleeding.  . Hair loss: You may notice hair getting thin. Some patients lose their hair. Hair loss is often complete scalp hair loss and can involve loss of eyebrows, eyelashes, and pubic hair. You may notice this a few days or weeks after treatment has started. Most often hair loss is temporary; your hair should grow back when treatment is done.  . Decreased appetite (decreased hunger)  . Blurred vision  . Soreness of the mouth and throat. You may have red areas, white patches, or sores that hurt.  . Effects on the bladder. This drug may cause irritation and bleeding in the bladder. You may have blood in your urine. To help stop this, you will get extra fluids to help you pass more urine. You may get a drug called mesna, which helps to decrease irritation and bleeding. You may also get a medicine to help you pass more urine. You may have a catheter (tube) placed in your bladder so that your bladder will be washed with this drug.  Possible Side Effects (Less Common)  . Darkening of the skin or nails  . Metallic taste in the mouth  . Changes in  lung tissue may happen with large amounts of this drug. These changes may not last forever, and your lung tissue may go back to normal. Sometimes these changes may not be seen for many years. You may get a cough or have trouble catching your breath.    Allergic Reactions  Serious allergic reactions including anaphylaxis are rare. While you are getting this drug in your vein (IV), tell your nurse right away if you have  any of these symptoms of an allergic reaction:  . Trouble catching your breath  . Feeling like your tongue or throat are swelling  . Feeling your heart beat quickly or in a not normal way (palpitations)  . Feeling dizzy or lightheaded  . Flushing, itching, rash, and/or hives  Treating Side Effects  . Drink 6-8 cups of fluids each day unless your doctor has told you to limit your fluid intake due to some other health problem. A cup is 8 ounces of fluid. If you throw up or have loose bowel movements you should drink more fluids so that you do not become dehydrated (lack water in the body due to losing too much fluid).  . Ask your doctor or nurse about medicine that is available to help stop or lessen nausea or throwing up.  . Mouth care is very important. Your mouth care should consist of routine, gentle cleaning of your teeth or dentures and rinsing your mouth with a mixture of 1/2 teaspoon of salt in 8 ounces of water or  teaspoon of baking soda in 8 ounces of water. This should be done at least after each meal and at bedtime.  . If you have mouth sores, avoid mouthwash that has alcohol. Also avoid alcohol and smoking because they can bother your mouth and throat.  . Talk with your nurse about getting a wig before you lose your hair. Also, call the [place] at 800-ACS-2345 to find out information about the " Look Good.Marland KitchenMarland KitchenFeel Better" program close to where you live. It is a free program where women undergoing chemotherapy learn about wigs, turbans and scarves  as well as makeup techniques and skin and nail care.  Important Information  . Whenever you tell a doctor or nurse your health history, always tell them that you have received cyclophosphamide in the past.  . If you take this drug by mouth swallow the medicine whole. Do not chew, break or crush it.  . You can take the medicine with or without food. If you have nausea, take it with food. Do not take the pills at bedtime.  Food and Drug Interactions  There are no known interactions of cyclophosphamide with food. This drug may interact with other medicines. Tell your doctor and pharmacist about all the medicines and dietary supplements (vitamins, minerals, herbs and others) that you are taking at this time. The safety and use of dietary supplements and alternative diets are often not known. Using these might affect your cancer or interfere with your treatment. Until more is known, you should not use dietary supplements or alternative diets without your cancer doctor's help.  When to Call the Doctor  Call your doctor or nurse right away if you have any of these symptoms:  . Fever of 100.5 F (38 C) or higher  . Chills  . Bleeding or bruising that is not normal  . Blurred vision or other changes in eyesight  . Pain when passing urine; blood in urine  . Pain in your lower back or side  . Wheezing or trouble breathing  . Swelling of legs, ankles, or feet  . Feeling dizzy or lightheaded  . Feeling confused or agitated  . Signs of liver problems: dark urine, pale bowel movements, bad stomach pain, feeling very tired and weak, unusual itching, or yellowing of the eyes or skin  . Unusual thirst or passing urine often  . Nausea that stops you from eating or drinking  . Throwing up more  than 3 times a day  Call your doctor or nurse as soon as possible if any of these symptoms happen:  . Pain in your mouth or throat that makes it hard to eat or drink  . Nausea not relieved by  prescribed medicines  Sexual Problems and Reproductive Concerns  . Infertility warning: Sexual problems and reproduction concerns may happen. In both men and women, this drug may affect your ability to have children. This cannot be determined before your treatment. Talk with your doctor or nurse if you plan to have children. Ask for information on sperm or egg banking.  . In men, this drug may interfere with your ability to make sperm, but it should not change your ability to have sexual relations.  . In women, menstrual bleeding may become irregular or stop while you are getting this drug. Do not assume that you cannot become pregnant if you do not have a menstrual period.  . Women may go through signs of menopause (change of life) like vaginal dryness or itching. Vaginal lubricants can be used to lessen vaginal dryness, itching, and pain during sexual relations.  . Genetic counseling is available for you to talk about the effects of this drug therapy on future pregnancies. Also, a genetic counselor can look at the possible risk of problems in the unborn baby due to this medicine if an exposure happens during pregnancy.  . Pregnancy warning: This drug may have harmful effects on the unborn child, so effective methods of birth control should be used during your cancer treatment.  . Breast feeding warning: Women should not breast feed during treatment because this drug could enter the breast milk and badly harm a breast feeding baby.     Rituximab (Generic Name) Other Name: Rituxan  About This Drug  Rituximab is a monoclonal antibody used to treat cancer. This drug is given in the vein (IV).  This first time this is given it will be infused slower to monitor for infusion reactions.    Possible Side Effects (More Common)  . Bone marrow depression. This is a decrease in the number of white blood cells, red blood cells, and platelets. This  may raise your risk of infection, make you tired  and weak (fatigue), and raise your risk of bleeding.  . Rash-skin irritation, redness or itching (dermatitis)  . Flu-like symptoms: fever, headache, muscle and joint aches, and fatigue (low energy, feeling weak)  . Infusion-related reactions  . Hepatitis B - if you have ever had hepatitis B, the virus may come back during treatment with this drug. Your doctor will test to see if you have ever had hepatitis B prior to your treatment.  . Changes in your central nervous system can happen. The central nervous system is made up of your brain and spinal cord. You could feel: extreme tiredness, agitation, confusion, or have: hallucinations (see or hear things that are not there), trouble understanding or speaking, loss of control of your bowels or bladder, eyesight changes, numbness or lack of strength to your arms, legs, face, or body, seizures or coma. If you start to have any of these symptoms let your doctor know right away.  . Tumor lysis: This drug may act on the cancer cells very quickly. This may affect how your kidneys work. Your doctor will monitor your kidney function.  . Changes in your liver function. Your doctor will check your liver function as needed.  . Nausea and throwing up (vomiting): these symptoms may happen  within a few hours after your treatment and may last up to 24 hours. Medicines are available to stop or lessen these side effects.  . Loose bowel movements (diarrhea) that may last for a few days  . Abdominal pain  . Infections  . Cough, runny nose  . Swelling of your legs, ankles and/or feet or hands  . High blood pressure. Your doctor will check your blood pressure as needed.  . Abnormal heart beat  Possible Side Effects (Less Common)  . Shortness of breath  . Soreness of the mouth and throat. You may have red areas, white patches, or sores that hurt.  Infusion Reactions  Infusion Reactions are the most common side effect linked to use of this drug and can  be quite severe. Medicines will be given before you get the drug to lower the severity of this side effect. The infusion reactions are the worse with the first dose of the drug and become less severe with more doses of the drug. While you are getting this drug in your vein (IV), tell your nurse right away if you have any of these symptoms of an allergic reaction:  . Trouble catching your breath  . Feeling like your tongue or throat are swelling  . Feeling your heart beat quickly or in a not normal way (palpitations)  . Feeling dizzy or lightheaded  . Flushing, itching, rash, and/or hives  Treating Side Effects  . Ask your doctor or nurse about medicine to stop or lessen headache, loose bowel movements (diarrhea), constipation, nausea, throwing up (vomiting), or pain.  . If you get a rash do not put anything it unless your doctor or nurse says you may. Keep the area around the rash clean and dry. Ask your doctor for medicine if the rash bothers you.  . Drink 6-8 cups of fluids each day unless your doctor has told you to limit your fluid intake due to some other health problem. A cup is 8 ounces of fluid. If you throw up or have loose bowel movements, you should drink more fluids so that you do not become dehydrated (lack of water in the body from losing too much fluid).  . If you are not able to move your bowels, check with your doctor or nurse before you use enemas, laxatives, or suppositories  . If you have mouth sores, avoid mouthwash that has alcohol. Also avoid alcohol and smoking because they can bother your mouth and throat.  . If you have a nose bleed, sit with your head tipped slightly forward. Apply pressure by lightly pinching the bridge of your nose between your thumb and forefinger. Call your doctor if you feel dizzy or faint or if the bleeding doesn't stop after 10 to 15 minutes  Important Information  . After treatment with this drug, vaccination with live viruses should be  delayed until the immune system recovers.  . Symptoms of abnormal bleeding may be: coughing up blood, throwing up blood (may look like coffee grounds), red or black, tarry bowel movements, blood in urine, abnormally heavy menstrual flow, nosebleeds, or any unusual bleeding.  . Symptoms of high blood pressure may be: headache, blurred vision, confusion, chest pain, or a feeling that your heart is beat differently.  Marland Kitchen Urinary tract infection. Symptoms may include:  . Pain or burning when you pass urine  . Feeling like you have to pass urine often, but not much comes out when you do.  . Tender or heavy feeling in  your lower abdomen  . Cloudy urine and/or urine that smells bad.  . Pain on one side of your back under your ribs. This is where your kidneys are.  . Fever, chills, nausea and/or throwing up  Food and Drug Interactions  There are no known interactions of rituximab and any food. This drug may interact with other medicines. Tell your doctor and pharmacist about all the medicines and dietary supplements (vitamins, minerals, herbs and others) that you are taking at this time. The safety and use of dietary supplements and alternative diets are often not known. Using these might affect your cancer or interfere with your treatment. Until more is known, you should not use dietary supplements or alternative diets without your cancer doctor's help.  When to Call the Doctor  Call your doctor or nurse right away if you have any of these symptoms:  . Fever of 100.5 F (38 C) higher  . Chills  . Trouble breathing  . Rash with or without itching  . Blistering or peeling of skin  . Chest pain or symptoms of a heart attack. Most heart attacks involve pain in the center of the chest that lasts more than a few minutes. The pain may go away and come back or it can be constant. It can feel like pressure, squeezing, fullness, or pain. Sometimes pain is felt in one or both arms, the back, neck,  jaw, or stomach. If any of these symptoms last 2 minutes, call 911  . Easy bleeding or bruising  . Blood in urine or bowel movements  . Feeling that your heart is beating in a fast or not normal way (palpitations)  . Nausea that stops you from eating or drinking  . Throwing up (vomiting) more than 3 times in one day  . Abdominal pain  . Loose bowel movements (diarrhea) 4 times in one day or diarrhea with weakness or lightheadedness  . No bowel movement in 3 days or if you feel uncomfortable  . Feeling dizzy or lightheaded  . Changes in your speech or vision  . Feeling confused  . Weakness of your arms and legs or poor coordination (feeling clumsy)  . Signs of liver problems: dark urine, pale bowel movements, bad stomach pain, feeling very tired and weak, unusual itching, or yellowing of skin or eyes  . Symptoms of a urinary tract infection (see important information)  Call your doctor or nurse as soon as possible if you have any of these symptoms:  . Swelling of your legs, ankles and/or feet  . Fatigue and /or weakness that interferes with your daily activities  . Joint and muscle pain or muscle spasms that are not relieved by prescribed medicines  . Cough that lasts longer than normal    Reproduction Concerns  . Pregnancy warning: This drug is known to cross the placenta. This drug may have harmful effects on an unborn baby. Effective methods of birth control should be used during treatment with this drug and for 12 months after the last treatment. If exposure occurs to an unborn baby, the baby's immune system may be affected, which could last for months after birth. Until the immune system recovers, live vaccines should not be administered to the baby. Be sure to talk with your doctor if you are pregnant or planning to become pregnant while getting this drug.  . Breast feeding warning: It is not known if rituximab is passed into human breast milk. In animal studies,  this drug was detected  in in breast milk. For this reason, women should talk to their doctor about the risks and benefits of breast feeding during treatment with this drug because this drug may enter the breast milk and badly harm a breast feeding baby.  Prednisone  About This Drug  Prednisone is a steroid that may be used to treat cancer. It is given orally (by mouth).  Possible Side Effects . Abnormal heart beat  . Headache  . High blood pressure  . Increased sweating  . Blurred vision or other changes in eyesight  . Tiredness and weakness  . Changes in mood, which may include depression or a feeling of extreme well-being  . Trouble sleeping  . Feeling restless (unable to relax)  . Skin changes such as rash, dryness, or redness  . Increased appetite (increased hunger)  . Weight gain  . Electrolyte changes  . Increased risk of infections  . Aggravation of stomach ulcers  . Pain in your abdomen  . Nausea  . Blood sugar levels may change  . Swelling of your legs, ankles and/or feet  . Muscle loss and/or weakness (lack of muscle strength)  . Increased risk of developing osteoporosis- your bones may become weak and brittle  . Increased risk for cataracts, glaucoma or infections of the eye  . Changes in your liver function  Note: Not all possible side effects are included above.  Warnings and Precautions  . High blood pressure and changes in electrolytes, which can cause fluid build-up around your heart, lungs or elsewhere.  . Severe infections, which can be life-threatening.  . Allergic reactions, including anaphylaxis are rare but may happen in some patients. Signs of allergic reaction to this drug may be swelling of the face, feeling like your tongue or throat are swelling, trouble breathing, rash, itching, fever, chills, feeling dizzy, and/or feeling that your heart is beating in a fast or not normal way. If this happens, do not take another dose of  this drug. You should get urgent medical treatment.  . Increased risk of developing a hole in your stomach, small, and/or large intestine if you have ulcers in the lining of your stomach and/or intestine, or have diverticulitis, ulcerative colitis and/or other diseases that affect the gastrointestinal tract.  . Blurred vision or other changes in eyesight.  . Severe depression and other psychiatric disorders such as mood changes.  . Effects on the endocrine glands including pituitary, adrenals and thyroid during or after use of this medication.  Note: Some of the side effects above are very rare. If you have concerns and/or questions, please discuss them with your medical team.  Important Information  . Talk to your doctor or your nurse before stopping this medication, it should be stopped gradually. Depending on the dose and length of treatment, you could experience serious side effects if stopped abruptly (suddenly).  . Talk to your doctor before receiving any vaccinations during your treatment. Some vaccinations are not recommended while receiving prednisone.  How to Take Your Medication  . For Oral (by mouth): You can take the medicine with or without food, but it is preferable to be taken with food, especially If you have nausea or upset stomach.  . Missed dose: If you miss or vomit a dose, contact your physician. Do not take 2 doses at the same time and do not double up on the next dose.  Marland Kitchen Handling: Wash your hands after handling your medicine, your caretakers should not handle your medicine with bare hands  and should wear latex gloves.  . Storage: Store this medicine in the original container at room temperature, protect from moisture. Discuss with your nurse or your doctor how to dispose of unused medicine.  Treating Side Effects  . Drink plenty of fluids (a minimum of eight glasses per day is recommended).  . To help with nausea and vomiting, eat small, frequent  meals instead of three large meals a day. Choose foods and drinks that are at room temperature. Ask your nurse or doctor about other helpful tips and medicine that is available to help stop or lessen these symptoms.  . If you throw up, you should drink more fluids so that you do not become dehydrated (lack of water in the body from losing too much fluid).  . Manage tiredness by pacing your activities for the day. Be sure to include periods of rest between energy-draining activities.  . To help with muscle weakness, get regular exercise. If you feel too tired to exercise vigorously, try taking a short walk.  . If you are having trouble sleeping, talk to your nurse or doctor on tips to help you sleep better.  . If you are feeling depressed, talk to your nurse or doctor about it.  Marland Kitchen Keeping your pain under control is important to your well-being. Please tell your doctor or nurse if you are experiencing pain.  . If you have diabetes, keep good control of your blood sugar level. Tell your nurse or your doctor if your glucose levels are higher or lower than normal.  . To decrease the risk of infection, wash your hands regularly.  . Avoid close contact with people who have a cold, the flu, or other infections.  . Take your temperature as your doctor or nurse tells you, and whenever you feel like you may have a fever.  . If you get a rash do not put anything on it unless your doctor or nurse says you may. Keep the area around the rash clean and dry. Ask your doctor for medicine if your rash bothers you.  . Moisturize your skin several times a day  . Avoid sun exposure and apply sunscreen routinely when outdoors.  Food and Drug Interactions  . This drug may interact with grapefruit and grapefruit juice. Talk to your doctor as this could make side effects worse.  . Check with your doctor or pharmacist about all other prescription medicines and over-the-counter medicines and dietary  supplements (vitamins, minerals, herbs and others) you are taking before starting this medicine as there are known drug interactions with prednisone. Also, check with your doctor or pharmacist before starting any new prescription or over-the-counter medicines, or dietary supplements to make sure that there are no interactions.  . There are known interactions of prednisone with other medicines and products like aspirin, and other nonsteroidal anti-inflammatory agents. Ask your doctor what over-the-counter (OTC) medicines you can take.  . There are known interactions of prednisone with blood thinning medicine such as warfarin. Ask your doctor what precautions you should take.  Marland Kitchen Avoid the use of St. John's Wort while taking prednisone as this may lower the levels of the drug in your body, which can make it less effective.  When to Call the Doctor  Call your doctor or nurse if you have any of these symptoms and/or any new or unusual symptoms:  . Fever of 100.4 F (38 C) or higher  . Chills  . A headache that does not go away  .  Blurry vision or other changes in eyesight  . Feel irritable, nervous or restless  . Trouble sleeping  . Tiredness or weakness that interferes with your daily activities  . Trouble breathing  . Feeling that your heart is beating in a fast or not normal way (palpitations)  . Chest pain or symptoms of a heart attack. Most heart attacks involve pain in the center of the chest that lasts more than a few minutes. The pain may go away and come back or it can be constant. It can feel like pressure, squeezing, fullness, or pain. Sometimes pain is felt in one or both arms, the back, neck, jaw, or stomach. If any of these symptoms last 2 minutes, call 911.  . Nausea that stops you from eating or drinking and/or is not relieved by prescribed medicines  . Throwing up more than 3 times a day  . Severe abdominal pain that does not go away  . Heartburn or  indigestion  . Abnormal blood sugar  . Severe mood changes such as depression or unusual thoughts and/or behaviors  . Thoughts of hurting yourself or others, and suicide  . Feeling hopeless on most days  . Unusual thirst, passing urine often, headache, sweating, shakiness, irritability  . Swelling of legs, ankles, or feet  . Weight gain of 5 pounds in one week (fluid retention)  . A new rash or a rash that is not relieved by prescribed medicines  . Signs of possible liver problems: dark urine, pale bowel movements, bad stomach pain, feeling very tired and weak, unusual itching, or yellowing of the eyes or skin  . Signs of allergic reaction: swelling of the face, feeling like your tongue or throat are swelling, trouble breathing, rash, itching, fever, chills, feeling dizzy, and/or feeling that your heart is beating in a fast or not normal way. If this happens, call 911 for emergency care.  . If you think you may be pregnant  Reproduction Warnings  . Pregnancy warning: It is not known if this drug may harm an unborn child. For this reason, be sure to talk with your doctor if you are pregnant or planning to become pregnant while receiving this drug. Let your doctor know right away if you think you may be pregnant.  . Breastfeeding warning: It is not known if this drug passes into breast milk. For this reason, women should talk to their doctor about the risks and benefits of breastfeeding during treatment with this drug because this drug may enter the breast milk and cause harm to a breastfeeding baby.  . Fertility warning: Human fertility studies have not been done with this drug. Talk with your doctor or nurse if you plan to have children. Ask for information on sperm or egg banking.  You will get Neulasta (or similar) after every treatment of chemo:  Neulasta (or similar) - this medication is not chemotherapy but is being given because you have had chemo. It is usually given  24-48 hours after the completion of chemotherapy. This medication works by boosting your bone marrow's supply of white blood cells. White blood cells are what protect our bodies against infection. The medication is given in the form of a subcutaneous injection. It is given in the fatty tissue of your abdomen, or in the skin on the back of your arm . It is a short needle. The major side effect of this medication is bone or muscle pain. The drug of choice to relieve or lessen the pain is  Aleve or Ibuprofen. If a physician has ever told you not to take Aleve or Ibuprofen - then don't take it. You should then take Tylenol/acetaminophen. Take either medication as the bottle directs you to.  The level of pain you experience as a result of this injection can range from none, to mild or moderate, or severe. Please let us know if you develop moderate or severe bone pain.   You can take Claritin 10 mg over the counter for a few days after receiving Neulasta to help with the bone aches and pains.      SELF CARE ACTIVITIES WHILE RECEIVING CHEMOTHERAPY:  Hydration Increase your fluid intake 48 hours prior to treatment and drink at least 8 to 12 cups (64 ounces) of water/decaffeinated beverages per day after treatment. You can still have your cup of coffee or soda but these beverages do not count as part of your 8 to 12 cups that you need to drink daily. No alcohol intake.  Medications Continue taking your normal prescription medication as prescribed.  If you start any new herbal or new supplements please let us know first to make sure it is safe.  Mouth Care Have teeth cleaned professionally before starting treatment. Keep dentures and partial plates clean. Use soft toothbrush and do not use mouthwashes that contain alcohol. Biotene is a good mouthwash that is available at most pharmacies or may be ordered by calling [phone]. Use warm salt water gargles (1 teaspoon salt per 1 quart warm water) before and after  meals and at bedtime. If you need dental work, please let the doctor know before you go for your appointment so that we can coordinate the best possible time for you in regards to your chemo regimen. You need to also let your dentist know that you are actively taking chemo. We may need to do labs prior to your dental appointment.  Skin Care Always use sunscreen that has not expired and with SPF (Sun Protection Factor) of 50 or higher. Wear hats to protect your head from the sun. Remember to use sunscreen on your hands, ears, face, & feet.  Use good moisturizing lotions such as udder cream, eucerin, or even Vaseline. Some chemotherapies can cause dry skin, color changes in your skin and nails.    . Avoid long, hot showers or baths. . Use gentle, fragrance-free soaps and laundry detergent. . Use moisturizers, preferably creams or ointments rather than lotions because the thicker consistency is better at preventing skin dehydration. Apply the cream or ointment within 15 minutes of showering. Reapply moisturizer at night, and moisturize your hands every time after you wash them.  Hair Loss (if your doctor says your hair will fall out)  . If your doctor says that your hair is likely to fall out, decide before you begin chemo whether you want to wear a wig. You may want to shop before treatment to match your hair color. . Hats, turbans, and scarves can also camouflage hair loss, although some people prefer to leave their heads uncovered. If you go bare-headed outdoors, be sure to use sunscreen on your scalp. . Cut your hair short. It eases the inconvenience of shedding lots of hair, but it also can reduce the emotional impact of watching your hair fall out. . Don't perm or color your hair during chemotherapy. Those chemical treatments are already damaging to hair and can enhance hair loss. Once your chemo treatments are done and your hair has grown back, it's OK to resume  dyeing or perming hair.  With  chemotherapy, hair loss is almost always temporary. But when it grows back, it may be a different color or texture. In older adults who still had hair color before chemotherapy, the new growth may be completely gray.  Often, new hair is very fine and soft.  Infection Prevention Please wash your hands for at least 30 seconds using warm soapy water. Handwashing is the #1 way to prevent the spread of germs. Stay away from sick people or people who are getting over a cold. If you develop respiratory systems such as green/yellow mucus production or productive cough or persistent cough let us know and we will see if you need an antibiotic. It is a good idea to keep a pair of gloves on when going into grocery stores/[employer] to decrease your risk of coming into contact with germs on the carts, etc. Carry alcohol hand gel with you at all times and use it frequently if out in public. If your temperature reaches 100.5 or higher please call the clinic and let us know.  If it is after hours or on the weekend please go to the ER if your temperature is over 100.5.  Please have your own personal thermometer at home to use.    Sex and bodily fluids If you are going to have sex, a condom must be used to protect the person that isn't taking chemotherapy. Chemo can decrease your libido (sex drive). For a few days after chemotherapy, chemotherapy can be excreted through your bodily fluids.  When using the toilet please close the lid and flush the toilet twice.  Do this for a few day after you have had chemotherapy.   Effects of chemotherapy on your sex life Some changes are simple and won't last long. They won't affect your sex life permanently.  Sometimes you may feel: . too tired . not strong enough to be very active . sick or sore  . not in the mood . anxious or low Your anxiety might not seem related to sex. For example, you may be worried about the cancer and how your treatment is going. Or you may be worried about  money, or about how you family are coping with your illness. These things can cause stress, which can affect your interest in sex. It's important to talk to your partner about how you feel. Remember - the changes to your sex life don't usually last long. There's usually no medical reason to stop having sex during chemo. The drugs won't have any long term physical effects on your performance or enjoyment of sex. Cancer can't be passed on to your partner during sex  Contraception It's important to use reliable contraception during treatment. Avoid getting pregnant while you or your partner are having chemotherapy. This is because the drugs may harm the baby. Sometimes chemotherapy drugs can leave a man or woman infertile.  This means you would not be able to have children in the future. You might want to talk to someone about permanent infertility. It can be very difficult to learn that you may no longer be able to have children. Some people find counselling helpful. There might be ways to preserve your fertility, although this is easier for men than for women. You may want to speak to a fertility expert. You can talk about sperm banking or harvesting your eggs. You can also ask about other fertility options, such as donor eggs. If you have or have had breast cancer, your  doctor might advise you not to take the contraceptive pill. This is because the hormones in it might affect the cancer.  It is not known for sure whether or not chemotherapy drugs can be passed on through semen or secretions from the vagina. Because of this some doctors advise people to use a barrier method if you have sex during treatment. This applies to vaginal, anal or oral sex. Generally, doctors advise a barrier method only for the time you are actually having the treatment and for about a week after your treatment. Advice like this can be worrying, but this does not mean that you have to avoid being intimate with your partner. You  can still have close contact with your partner and continue to enjoy sex.  Animals If you have cats or birds we just ask that you not change the litter or change the cage.  Please have someone else do this for you while you are on chemotherapy.   Food Safety During and After Cancer Treatment Food safety is important for people both during and after cancer treatment. Cancer and cancer treatments, such as chemotherapy, radiation therapy, and stem cell/bone marrow transplantation, often weaken the immune system. This makes it harder for your body to protect itself from foodborne illness, also called food poisoning. Foodborne illness is caused by eating food that contains harmful bacteria, parasites, or viruses.  Foods to avoid Some foods have a higher risk of becoming tainted with bacteria. These include: Marland Kitchen Unwashed fresh fruit and vegetables, especially leafy vegetables that can hide dirt and other contaminants . Raw sprouts, such as alfalfa sprouts . Raw or undercooked beef, especially ground beef, or other raw or undercooked meat and poultry . Fatty, fried, or spicy foods immediately before or after treatment.  These can sit heavy on your stomach and make you feel nauseous. . Raw or undercooked shellfish, such as oysters. . Sushi and sashimi, which often contain raw fish.  . Unpasteurized beverages, such as unpasteurized fruit juices, raw milk, raw yogurt, or cider . Undercooked eggs, such as soft boiled, over easy, and poached; raw, unpasteurized eggs; or foods made with raw egg, such as homemade raw cookie dough and homemade mayonnaise  Simple steps for food safety  Shop smart. . Do not buy food stored or displayed in an unclean area. . Do not buy bruised or damaged fruits or vegetables. . Do not buy cans that have cracks, dents, or bulges. . Pick up foods that can spoil at the end of your shopping trip and store them in a cooler on the way home.  Prepare and clean up foods  carefully. . Rinse all fresh fruits and vegetables under running water, and dry them with a clean towel or paper towel. . Clean the top of cans before opening them. . After preparing food, wash your hands for 20 seconds with hot water and soap. Pay special attention to areas between fingers and under nails. . Clean your utensils and dishes with hot water and soap. Marland Kitchen Disinfect your kitchen and cutting boards using 1 teaspoon of liquid, unscented bleach mixed into 1 quart of water.    Dispose of old food. . Eat canned and packaged food before its expiration date (the "use by" or "best before" date). . Consume refrigerated leftovers within 3 to 4 days. After that time, throw out the food. Even if the food does not smell or look spoiled, it still may be unsafe. Some bacteria, such as Listeria, can grow even on  foods stored in the refrigerator if they are kept for too long.  Take precautions when eating out. . At restaurants, avoid buffets and salad bars where food sits out for a long time and comes in contact with many people. Food can become contaminated when someone with a virus, often a norovirus, or another "bug" handles it. . Put any leftover food in a "to-go" container yourself, rather than having the server do it. And, refrigerate leftovers as soon as you get home. . Choose restaurants that are clean and that are willing to prepare your food as you order it cooked.   AT HOME MEDICATIONS:                                                                                                                                                                Compazine/Prochlorperazine 10mg  tablet. Take 1 tablet every 6 hours as needed for nausea/vomiting. (This can make you sleepy)   EMLA cream. Apply a quarter size amount to port site 1 hour prior to chemo. Do not rub in. Cover with plastic wrap.    Diarrhea Sheet   If you are having loose stools/diarrhea, please purchase Imodium and begin taking  as outlined:  At the first sign of poorly formed or loose stools you should begin taking Imodium (loperamide) 2 mg capsules.  Take two tablets (4mg ) followed by one tablet (2mg ) every 2 hours - DO NOT EXCEED 8 tablets in 24 hours.  If it is bedtime and you are having loose stools, take 2 tablets at bedtime, then 2 tablets every 4 hours until morning.   Always call the [facility] if you are having loose stools/diarrhea that you can't get under control.  Loose stools/diarrhea leads to dehydration (loss of water) in your body.  We have other options of trying to get the loose stools/diarrhea to stop but you must let us know!   Constipation Sheet  Colace - 100 mg capsules - take 2 capsules daily.  If this doesn't help then you can increase to 2 capsules twice daily.  Please call if the above does not work for you. Do not go more than 2 days without a bowel movement.  It is very important that you do not become constipated.  It will make you feel sick to your stomach (nausea) and can cause abdominal pain and vomiting.  Nausea Sheet   Compazine/Prochlorperazine 10mg  tablet. Take 1 tablet every 6 hours as needed for nausea/vomiting (This can make you drowsy).  If you are having persistent nausea (nausea that does not stop) please call the [facility] and let us know the amount of nausea that you are experiencing.  If you begin to vomit, you need to call the [facility] and if it is the weekend  and you have vomited more than one time and can't get it to stop-go to the Emergency Room.  Persistent nausea/vomiting can lead to dehydration (loss of fluid in your body) and will make you feel very weak and unwell. Ice chips, sips of clear liquids, foods that are at room temperature, crackers, and toast tend to be better tolerated.    SYMPTOMS TO REPORT AS SOON AS POSSIBLE AFTER TREATMENT:  FEVER GREATER THAN 100.5 F  CHILLS WITH OR WITHOUT FEVER  NAUSEA AND VOMITING THAT IS NOT CONTROLLED WITH YOUR  NAUSEA MEDICATION  UNUSUAL SHORTNESS OF BREATH  UNUSUAL BRUISING OR BLEEDING  TENDERNESS IN MOUTH AND THROAT WITH OR WITHOUT   PRESENCE OF ULCERS  URINARY PROBLEMS  BOWEL PROBLEMS  UNUSUAL RASH    Wear comfortable clothing and clothing appropriate for easy access to any Portacath or PICC line. Let us know if there is anything that we can do to make your therapy better!    What to do if you need assistance after hours or on the weekends: CALL [phone].  HOLD on the line, do not hang up.  You will hear multiple messages but at the end you will be connected with a nurse triage line.  They will contact the doctor if necessary.  Most of the time they will be able to assist you.  Do not call the hospital operator.      I have been informed and understand all of the instructions given to me and have received a copy. I have been instructed to call the clinic [phone] or my family physician as soon as possible for continued medical care, if indicated. I do not have any more questions at this time but understand that I may call the [facility] or the Patient Navigator at [phone] during office hours should I have questions or need assistance in obtaining follow-up care.

## 2019-08-14 ENCOUNTER — Other Ambulatory Visit (HOSPITAL_COMMUNITY): Payer: Self-pay | Admitting: *Deleted

## 2019-08-14 ENCOUNTER — Inpatient Hospital Stay (HOSPITAL_COMMUNITY): Payer: Medicare Other

## 2019-08-14 ENCOUNTER — Ambulatory Visit (HOSPITAL_COMMUNITY): Payer: Medicare Other | Admitting: Hematology

## 2019-08-14 ENCOUNTER — Ambulatory Visit (HOSPITAL_COMMUNITY)
Admission: RE | Admit: 2019-08-14 | Discharge: 2019-08-14 | Disposition: A | Discharge status: Home / Self Care | Payer: Medicare Other | Source: Ambulatory Visit | Attending: Hematology | Admitting: Hematology

## 2019-08-14 ENCOUNTER — Other Ambulatory Visit: Payer: Self-pay

## 2019-08-14 DIAGNOSIS — Z95828 Presence of other vascular implants and grafts: Secondary | ICD-10-CM

## 2019-08-14 DIAGNOSIS — C8513 Unspecified B-cell lymphoma, intra-abdominal lymph nodes: Secondary | ICD-10-CM

## 2019-08-14 DIAGNOSIS — C8510 Unspecified B-cell lymphoma, unspecified site: Secondary | ICD-10-CM | POA: Diagnosis present

## 2019-08-14 LAB — CSF CELL COUNT WITH DIFFERENTIAL
RBC Count, CSF: 1 /mm3 — ABNORMAL HIGH
Tube #: 4
WBC, CSF: 0 /mm3 (ref 0–5)

## 2019-08-14 MED ORDER — OXYCODONE HCL 5 MG PO TABS
10.0000 mg | ORAL_TABLET | Freq: Once | ORAL | Status: AC
  Administered 2019-08-14: 10 mg via ORAL
  Filled 2019-08-14: qty 2

## 2019-08-14 NOTE — Progress Notes (Signed)
Patient resting quietly on stretcher supine, flat, with call bell in reach. Son at bedside. Ebony Hail RN from [facility] in to perform [facility] treatment.   Patient with Foley catheter and actively draining.

## 2019-08-14 NOTE — Progress Notes (Signed)
Orders placed for lumbar puncture CSF fluid, cell count flow cytometry and cytology.

## 2019-08-14 NOTE — Discharge Instructions (Signed)
° ° ° °  Please remain flat as much as possible for the remainder of the day    At 1:49pm today , Oxycodone 10mg  given in Short stay by mouth      Lumbar Puncture, Care After This sheet gives you information about how to care for yourself after your procedure. Your health care provider may also give you more specific instructions. If you have problems or questions, contact your health care provider. What can I expect after the procedure? After the procedure, it is common to have:  Mild discomfort or pain at the puncture site.  A mild headache that is relieved with pain medicines. Follow these instructions at home: Activity   Lie down flat or rest for as long as directed by your health care provider.  Return to your normal activities as told by your health care provider. Ask your health care provider what activities are safe for you.  Avoid lifting anything heavier than 10 lb (4.5 kg) for at least 12 hours after the procedure.  Do not drive for 24 hours if you were given a medicine to help you relax (sedative) during your procedure.  Do not drive or use heavy machinery while taking prescription pain medicine. Puncture site care  Remove or change your bandage (dressing) as told by your health care provider.  Check your puncture area every day for signs of infection. Check for: ? More pain. ? Redness or swelling. ? Fluid or blood leaking from the puncture site. ? Warmth. ? Pus or a bad smell. General instructions  Take over-the-counter and prescription medicines only as told by your health care provider.  Drink enough fluids to keep your urine clear or pale yellow. Your health care provider may recommend drinking caffeine to prevent a headache.  Keep all follow-up visits as told by your health care provider. This is important. Contact a health care provider if:  You have fever or chills.  You have nausea or vomiting.  You have a headache that lasts for more than 2  days or does not get better with medicine. Get help right away if:  You develop any of the following in your legs: ? Weakness. ? Numbness. ? Tingling.  You are unable to control when you urinate or have a bowel movement (incontinence).  You have signs of infection around your puncture site, such as: ? More pain. ? Redness or swelling. ? Fluid or blood leakage. ? Warmth. ? Pus or a bad smell.  You are dizzy or you feel like you might faint.  You have a severe headache, especially when you sit or stand. Summary  A lumbar puncture is a procedure in which a small needle is inserted into the lower back to remove fluid that surrounds the brain and spinal cord.  After this procedure, it is common to have a headache and pain around the needle insertion area.  Lying flat, staying hydrated, and drinking caffeine can help prevent headaches.  Monitor your needle insertion site for signs of infection, including warmth, fluid, or more pain.  Get help right away if you develop leg weakness, leg numbness, incontinence, or severe headaches. This information is not intended to replace advice given to you by your health care provider. Make sure you discuss any questions you have with your health care provider. Document Released: 10/28/2013 Document Revised: 12/06/2016 Document Reviewed: 12/06/2016 Elsevier Patient Education  2020 [employer].

## 2019-08-14 NOTE — Progress Notes (Signed)

## 2019-08-14 NOTE — Procedures (Signed)
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

## 2019-08-14 NOTE — Progress Notes (Signed)
Anastasio Champion RN at [facility] notified via telephone of patient with chronic pain and son did not bring any medications to this appointment.  Telephone orders received from Wenda Low NP, [place].

## 2019-08-15 ENCOUNTER — Observation Stay (HOSPITAL_COMMUNITY)
Admission: EM | Admit: 2019-08-15 | Discharge: 2019-08-17 | Disposition: A | Discharge status: Home / Self Care | Payer: Medicare Other | Attending: Family Medicine | Admitting: Family Medicine

## 2019-08-15 ENCOUNTER — Encounter (HOSPITAL_COMMUNITY): Payer: Self-pay

## 2019-08-15 ENCOUNTER — Other Ambulatory Visit: Payer: Self-pay

## 2019-08-15 ENCOUNTER — Emergency Department (HOSPITAL_COMMUNITY): Payer: Medicare Other

## 2019-08-15 DIAGNOSIS — Z7984 Long term (current) use of oral hypoglycemic drugs: Secondary | ICD-10-CM | POA: Insufficient documentation

## 2019-08-15 DIAGNOSIS — I10 Essential (primary) hypertension: Secondary | ICD-10-CM | POA: Insufficient documentation

## 2019-08-15 DIAGNOSIS — Z960 Presence of urogenital implants: Secondary | ICD-10-CM | POA: Diagnosis not present

## 2019-08-15 DIAGNOSIS — N400 Enlarged prostate without lower urinary tract symptoms: Secondary | ICD-10-CM | POA: Diagnosis not present

## 2019-08-15 DIAGNOSIS — C8510 Unspecified B-cell lymphoma, unspecified site: Secondary | ICD-10-CM | POA: Diagnosis present

## 2019-08-15 DIAGNOSIS — C8513 Unspecified B-cell lymphoma, intra-abdominal lymph nodes: Secondary | ICD-10-CM | POA: Diagnosis not present

## 2019-08-15 DIAGNOSIS — E1140 Type 2 diabetes mellitus with diabetic neuropathy, unspecified: Secondary | ICD-10-CM | POA: Insufficient documentation

## 2019-08-15 DIAGNOSIS — Z20828 Contact with and (suspected) exposure to other viral communicable diseases: Secondary | ICD-10-CM | POA: Insufficient documentation

## 2019-08-15 DIAGNOSIS — R531 Weakness: Secondary | ICD-10-CM

## 2019-08-15 DIAGNOSIS — E1142 Type 2 diabetes mellitus with diabetic polyneuropathy: Secondary | ICD-10-CM | POA: Diagnosis not present

## 2019-08-15 DIAGNOSIS — E119 Type 2 diabetes mellitus without complications: Secondary | ICD-10-CM | POA: Diagnosis not present

## 2019-08-15 DIAGNOSIS — G250 Essential tremor: Secondary | ICD-10-CM | POA: Insufficient documentation

## 2019-08-15 DIAGNOSIS — E1159 Type 2 diabetes mellitus with other circulatory complications: Secondary | ICD-10-CM | POA: Diagnosis present

## 2019-08-15 DIAGNOSIS — Z79899 Other long term (current) drug therapy: Secondary | ICD-10-CM | POA: Insufficient documentation

## 2019-08-15 LAB — CBC WITH DIFFERENTIAL/PLATELET
Abs Immature Granulocytes: 0 10*3/uL (ref 0.00–0.07)
Basophils Absolute: 0 10*3/uL (ref 0.0–0.1)
Basophils Relative: 1 %
Eosinophils Absolute: 0 10*3/uL (ref 0.0–0.5)
Eosinophils Relative: 2 %
HCT: 27.6 % — ABNORMAL LOW (ref 39.0–52.0)
Hemoglobin: 8.4 g/dL — ABNORMAL LOW (ref 13.0–17.0)
Immature Granulocytes: 0 %
Lymphocytes Relative: 36 %
Lymphs Abs: 1 10*3/uL (ref 0.7–4.0)
MCH: 31.8 pg (ref 26.0–34.0)
MCHC: 30.4 g/dL (ref 30.0–36.0)
MCV: 104.5 fL — ABNORMAL HIGH (ref 80.0–100.0)
Monocytes Absolute: 1.1 10*3/uL — ABNORMAL HIGH (ref 0.1–1.0)
Monocytes Relative: 41 %
Neutro Abs: 0.5 10*3/uL — ABNORMAL LOW (ref 1.7–7.7)
Neutrophils Relative %: 20 %
Platelets: 226 10*3/uL (ref 150–400)
RBC: 2.64 MIL/uL — ABNORMAL LOW (ref 4.22–5.81)
RDW: 15.3 % (ref 11.5–15.5)
WBC: 2.7 10*3/uL — ABNORMAL LOW (ref 4.0–10.5)
nRBC: 0 % (ref 0.0–0.2)

## 2019-08-15 LAB — COMPREHENSIVE METABOLIC PANEL
ALT: 14 U/L (ref 0–44)
AST: 17 U/L (ref 15–41)
Albumin: 2.7 g/dL — ABNORMAL LOW (ref 3.5–5.0)
Alkaline Phosphatase: 68 U/L (ref 38–126)
Anion gap: 8 (ref 5–15)
BUN: 29 mg/dL — ABNORMAL HIGH (ref 8–23)
CO2: 24 mmol/L (ref 22–32)
Calcium: 11 mg/dL — ABNORMAL HIGH (ref 8.9–10.3)
Chloride: 102 mmol/L (ref 98–111)
Creatinine, Ser: 1.59 mg/dL — ABNORMAL HIGH (ref 0.61–1.24)
GFR calc Af Amer: 48 mL/min — ABNORMAL LOW (ref >60)
GFR calc non Af Amer: 42 mL/min — ABNORMAL LOW (ref >60)
Glucose, Bld: 126 mg/dL — ABNORMAL HIGH (ref 70–99)
Potassium: 4.7 mmol/L (ref 3.5–5.1)
Sodium: 134 mmol/L — ABNORMAL LOW (ref 135–145)
Total Bilirubin: 0.4 mg/dL (ref 0.3–1.2)
Total Protein: 6.7 g/dL (ref 6.5–8.1)

## 2019-08-15 LAB — URINALYSIS, ROUTINE W REFLEX MICROSCOPIC
Bacteria, UA: NONE SEEN
Bilirubin Urine: NEGATIVE
Glucose, UA: >=500 mg/dL — AB
Hgb urine dipstick: NEGATIVE
Ketones, ur: NEGATIVE mg/dL
Leukocytes,Ua: NEGATIVE
Nitrite: NEGATIVE
Protein, ur: NEGATIVE mg/dL
Specific Gravity, Urine: 1.017 (ref 1.005–1.030)
pH: 5 (ref 5.0–8.0)

## 2019-08-15 LAB — TROPONIN I (HIGH SENSITIVITY)
Troponin I (High Sensitivity): 6 ng/L (ref <18)
Troponin I (High Sensitivity): 7 ng/L (ref <18)

## 2019-08-15 LAB — CYTOLOGY - NON PAP

## 2019-08-15 LAB — LACTIC ACID, PLASMA: Lactic Acid, Venous: 1.2 mmol/L (ref 0.5–1.9)

## 2019-08-15 LAB — SARS CORONAVIRUS 2 BY RT PCR (HOSPITAL ORDER, PERFORMED IN ~~LOC~~ HOSPITAL LAB): SARS Coronavirus 2: NEGATIVE

## 2019-08-15 LAB — GLUCOSE, CAPILLARY: Glucose-Capillary: 96 mg/dL (ref 70–99)

## 2019-08-15 MED ORDER — CHLORHEXIDINE GLUCONATE CLOTH 2 % EX PADS
6.0000 | MEDICATED_PAD | Freq: Every day | CUTANEOUS | Status: DC
  Administered 2019-08-16 – 2019-08-17 (×2): 6 via TOPICAL

## 2019-08-15 MED ORDER — FOLIC ACID 1 MG PO TABS
1.0000 mg | ORAL_TABLET | Freq: Every day | ORAL | Status: DC
  Administered 2019-08-16 – 2019-08-17 (×2): 1 mg via ORAL
  Filled 2019-08-15 (×2): qty 1

## 2019-08-15 MED ORDER — INSULIN ASPART 100 UNIT/ML ~~LOC~~ SOLN: 0.0000 [IU] | Freq: Every day | SUBCUTANEOUS | Status: DC

## 2019-08-15 MED ORDER — VITAMIN B-12 1000 MCG PO TABS: 1000.0000 ug | ORAL_TABLET | Freq: Every day | ORAL | Status: DC

## 2019-08-15 MED ORDER — SUCRALFATE 1 GM/10ML PO SUSP
1.0000 g | Freq: Three times a day (TID) | ORAL | Status: DC
  Administered 2019-08-15 – 2019-08-17 (×6): 1 g via ORAL
  Filled 2019-08-15 (×5): qty 10

## 2019-08-15 MED ORDER — CANAGLIFLOZIN 100 MG PO TABS
100.0000 mg | ORAL_TABLET | Freq: Every day | ORAL | Status: DC
  Administered 2019-08-16: 100 mg via ORAL
  Filled 2019-08-15 (×3): qty 1

## 2019-08-15 MED ORDER — ONDANSETRON HCL 4 MG PO TABS: 4.0000 mg | ORAL_TABLET | Freq: Four times a day (QID) | ORAL | Status: DC | PRN

## 2019-08-15 MED ORDER — GABAPENTIN 300 MG PO CAPS
600.0000 mg | ORAL_CAPSULE | Freq: Three times a day (TID) | ORAL | Status: DC
  Administered 2019-08-15 – 2019-08-17 (×5): 600 mg via ORAL
  Filled 2019-08-15 (×5): qty 2

## 2019-08-15 MED ORDER — POLYETHYLENE GLYCOL 3350 17 G PO PACK: 17.0000 g | PACK | Freq: Every day | ORAL | Status: DC | PRN

## 2019-08-15 MED ORDER — SODIUM CHLORIDE 0.9 % IV BOLUS
500.0000 mL | Freq: Once | INTRAVENOUS | Status: AC
  Administered 2019-08-15: 13:00:00 500 mL via INTRAVENOUS

## 2019-08-15 MED ORDER — ONDANSETRON HCL 4 MG/2ML IJ SOLN: 4.0000 mg | Freq: Four times a day (QID) | INTRAMUSCULAR | Status: DC | PRN

## 2019-08-15 MED ORDER — ACETAMINOPHEN 325 MG PO TABS
650.0000 mg | ORAL_TABLET | Freq: Four times a day (QID) | ORAL | Status: DC | PRN
  Administered 2019-08-15 – 2019-08-16 (×3): 650 mg via ORAL
  Filled 2019-08-15 (×3): qty 2

## 2019-08-15 MED ORDER — PRIMIDONE 50 MG PO TABS
75.0000 mg | ORAL_TABLET | Freq: Three times a day (TID) | ORAL | Status: DC
  Administered 2019-08-15 – 2019-08-17 (×5): 75 mg via ORAL
  Filled 2019-08-15 (×5): qty 2

## 2019-08-15 MED ORDER — TAMSULOSIN HCL 0.4 MG PO CAPS
0.4000 mg | ORAL_CAPSULE | Freq: Every day | ORAL | Status: DC
  Administered 2019-08-16 – 2019-08-17 (×2): 0.4 mg via ORAL
  Filled 2019-08-15 (×2): qty 1

## 2019-08-15 MED ORDER — SODIUM CHLORIDE 0.9 % IV SOLN
INTRAVENOUS | Status: DC
  Administered 2019-08-15 – 2019-08-16 (×2): via INTRAVENOUS

## 2019-08-15 MED ORDER — PANTOPRAZOLE SODIUM 40 MG PO TBEC
40.0000 mg | DELAYED_RELEASE_TABLET | Freq: Two times a day (BID) | ORAL | Status: DC
  Administered 2019-08-16 – 2019-08-17 (×3): 40 mg via ORAL
  Filled 2019-08-15 (×3): qty 1

## 2019-08-15 MED ORDER — SODIUM CHLORIDE 0.9 % IV BOLUS
1000.0000 mL | Freq: Once | INTRAVENOUS | Status: AC
  Administered 2019-08-15: 1000 mL via INTRAVENOUS

## 2019-08-15 MED ORDER — TEMAZEPAM 15 MG PO CAPS: 15.0000 mg | ORAL_CAPSULE | Freq: Every evening | ORAL | Status: DC | PRN

## 2019-08-15 MED ORDER — CITALOPRAM HYDROBROMIDE 20 MG PO TABS
20.0000 mg | ORAL_TABLET | Freq: Every day | ORAL | Status: DC
  Administered 2019-08-15 – 2019-08-16 (×2): 20 mg via ORAL
  Filled 2019-08-15 (×2): qty 1

## 2019-08-15 MED ORDER — INSULIN ASPART 100 UNIT/ML ~~LOC~~ SOLN: 0.0000 [IU] | Freq: Three times a day (TID) | SUBCUTANEOUS | Status: DC

## 2019-08-15 MED ORDER — ALLOPURINOL 300 MG PO TABS
300.0000 mg | ORAL_TABLET | Freq: Every day | ORAL | Status: DC
  Administered 2019-08-16 – 2019-08-17 (×2): 300 mg via ORAL
  Filled 2019-08-15 (×2): qty 1

## 2019-08-15 MED ORDER — PROPRANOLOL HCL 20 MG PO TABS
40.0000 mg | ORAL_TABLET | Freq: Two times a day (BID) | ORAL | Status: DC
  Administered 2019-08-15 – 2019-08-17 (×4): 40 mg via ORAL
  Filled 2019-08-15 (×2): qty 4
  Filled 2019-08-15: qty 2
  Filled 2019-08-15: qty 4
  Filled 2019-08-15: qty 2
  Filled 2019-08-15 (×3): qty 4
  Filled 2019-08-15 (×2): qty 2

## 2019-08-15 MED ORDER — CALCITRIOL 0.5 MCG PO CAPS
0.5000 ug | ORAL_CAPSULE | Freq: Every day | ORAL | Status: DC
  Administered 2019-08-16 – 2019-08-17 (×2): 0.5 ug via ORAL
  Filled 2019-08-15: qty 2
  Filled 2019-08-15: qty 1
  Filled 2019-08-15: qty 2
  Filled 2019-08-15 (×2): qty 1

## 2019-08-15 MED ORDER — TOPIRAMATE 25 MG PO TABS
200.0000 mg | ORAL_TABLET | Freq: Every day | ORAL | Status: DC
  Administered 2019-08-15 – 2019-08-16 (×2): 200 mg via ORAL
  Filled 2019-08-15 (×2): qty 8

## 2019-08-15 NOTE — Plan of Care (Signed)

## 2019-08-15 NOTE — ED Triage Notes (Signed)
Pt is having body aches, malaise, and lethargic. Temp 99.7 with EMS. Symptoms have been going on for the last day. Cancer patient and should be starting treatment on Monday. Had a recent visit to the [facility]

## 2019-08-15 NOTE — ED Provider Notes (Signed)
[facility] EMERGENCY DEPARTMENT Provider Note   CSN: [id number] Arrival date & time: 08/15/19  1126     History   Chief Complaint Chief Complaint  Patient presents with  . Weakness    HPI Daniel Richards is a 73 y.o. male with history of hypertension, hyperlipidemia, diabetes, migraines, indwelling Foley catheter due to BPH, recent B-cell lymphoma diagnosis who presents with generalized weakness over the past several months.  Patient symptoms got worse this morning when he can even get out of bed.  He has had chills and a cough that started today 2.  He has not started cancer treatment yet, but is starting next week.  Patient denies any pain.  Patient denies any chest pain, shortness of breath, abdominal pain, nausea, vomiting, new urinary symptoms.     HPI  Past Medical History:  Diagnosis Date  . Anxiety and depression   . Diabetes mellitus 06/09/2011   pt taking metformin  . DJD of shoulder    right   . Hyperlipidemia   . Hypertension   . Migraines   . Port-A-Cath in place 08/13/2019  . Tremor   . Vitamin D deficiency     Patient Active Problem List   Diagnosis Date Noted  . Port-A-Cath in place 08/13/2019  . Anemia of chronic disease 08/07/2019  . Major depression, recurrent, chronic ([place]) 08/07/2019  . BPH without urinary obstruction 08/07/2019  . Aortic atherosclerosis ([place]) 08/07/2019  . Diabetic peripheral neuropathy ([place]) 08/07/2019  . Hypertension associated with type 2 diabetes mellitus ([place]) 08/07/2019  . B-cell lymphoma ([place]) 08/04/2019  . Odynophagia   . Acute kidney injury ([place])   . Falls frequently   . Generalized abdominal pain   . Goals of care, counseling/discussion   . Palliative care by specialist   . Folate deficiency   . Generalized weakness 07/25/2019  . Intra-abdominal lymphadenopathy 07/25/2019  . Retroperitoneal lymphadenopathy 07/25/2019  . Malnutrition of moderate degree 07/25/2019  . Tremor   . Hypercalcemia 07/24/2019  . B12  deficiency 11/18/2016  . Weakness generalized 11/16/2016  . Altered mental status, unspecified 11/16/2016  . Fracture, rib 11/16/2016  . Weakness 11/16/2016  . New onset of headaches after age 73 09/01/2015  . Morning headache 09/01/2015  . Daytime somnolence 09/01/2015  . Essential tremor 09/01/2015  . Intractable headache 09/01/2015  . Type 2 diabetes, controlled, with peripheral neuropathy ([place]) 06/09/2011    Past Surgical History:  Procedure Laterality Date  . BACK SURGERY    . KNEE SURGERY    . PORTACATH PLACEMENT Left 08/08/2019   Procedure: INSERTION PORT-A-CATH;  Surgeon: Aviva Signs, MD;  Location: [facility];  Service: General;  Laterality: Left;  . SHOULDER SURGERY          Home Medications    Prior to Admission medications   Medication Sig Start Date End Date Taking? Authorizing Provider  allopurinol (ZYLOPRIM) 300 MG tablet Take 1 tablet (300 mg total) by mouth daily. 08/11/19  Yes Derek Jack, MD  amLODipine (NORVASC) 5 MG tablet Take 1 tablet (5 mg total) by mouth daily. 08/06/19  Yes Gerlene Fee, NP  calcitRIOL (ROCALTROL) 0.5 MCG capsule Take 1 capsule (0.5 mcg total) by mouth daily. 08/06/19  Yes Gerlene Fee, NP  citalopram (CELEXA) 20 MG tablet Take 1 tablet (20 mg total) by mouth at bedtime. 08/06/19  Yes Gerlene Fee, NP  folic acid (FOLVITE) 1 MG tablet Take 1 tablet (1 mg total) by mouth daily. 08/06/19  Yes Ok Edwards  S, NP  gabapentin (NEURONTIN) 300 MG capsule Take 2 capsules (600 mg total) by mouth 3 (three) times daily. 08/06/19  Yes Gerlene Fee, NP  INVOKANA 100 MG TABS tablet Take 1 tablet (100 mg total) by mouth daily. 08/06/19  Yes Gerlene Fee, NP  metFORMIN (GLUCOPHAGE) 500 MG tablet Take 1 tablet (500 mg total) by mouth daily. 08/06/19  Yes Gerlene Fee, NP  pantoprazole (PROTONIX) 40 MG tablet Take 1 tablet (40 mg total) by mouth 2 (two) times daily before a meal. 08/06/19  Yes Gerlene Fee, NP  primidone  (MYSOLINE) 50 MG tablet Take 1.5 tablets (75 mg total) by mouth every 8 (eight) hours. 08/06/19  Yes Gerlene Fee, NP  propranolol (INDERAL) 40 MG tablet Take 1 tablet (40 mg total) by mouth 2 (two) times daily. 08/06/19  Yes Gerlene Fee, NP  sucralfate (CARAFATE) 1 GM/10ML suspension Take 10 mLs (1 g total) by mouth 4 (four) times daily -  with meals and at bedtime. 08/06/19  Yes Gerlene Fee, NP  tamsulosin (FLOMAX) 0.4 MG CAPS capsule Take 1 capsule (0.4 mg total) by mouth daily. Give 30 minutes after same meal daily. Do not open/crush/chew 08/06/19  Yes Gerlene Fee, NP  temazepam (RESTORIL) 15 MG capsule Take 1 capsule (15 mg total) by mouth at bedtime as needed for sleep. 08/11/19  Yes Derek Jack, MD  topiramate (TOPAMAX) 100 MG tablet Take 2 tablets (200 mg total) by mouth at bedtime. 08/06/19  Yes Gerlene Fee, NP  CYCLOPHOSPHAMIDE IV Inject into the vein every 21 ( twenty-one) days. 08/20/19   [provider]  DOXORUBICIN HCL IV Inject into the vein every 21 ( twenty-one) days. 08/20/19   [provider]  lidocaine-prilocaine (EMLA) cream Apply a small amount to port a cath site and cover with plastic wrap 1 hour prior to chemotherapy appointments 08/13/19   Derek Jack, MD  NON FORMULARY Diet: Regular, NAS, Consistent Carbohydrate    [provider]  predniSONE (DELTASONE) 20 MG tablet Take 3 tablets (60 mg total) by mouth daily. Take on days 1-5 of chemotherapy. 08/13/19   Derek Jack, MD  prochlorperazine (COMPAZINE) 10 MG tablet Take 1 tablet (10 mg total) by mouth every 6 (six) hours as needed (Nausea or vomiting). 08/13/19   Derek Jack, MD  riTUXimab in sodium chloride 0.9 % 250 mL Inject into the vein every 21 ( twenty-one) days. 08/20/19   [provider]  vinCRIStine 2 mg in sodium chloride 0.9 % 50 mL Inject 2 mg into the vein every 21 ( twenty-one) days. 08/20/19   [provider]  vitamin  B-12 1000 MCG tablet Take 1 tablet (1,000 mcg total) by mouth daily. Patient not taking: Reported on 08/15/2019 08/02/19   Orson [name], MD    Family History Family History  Problem Relation Age of Onset  . Cancer Sister   . Cancer Sister   . Cancer Brother   . Heart attack Father   . Cancer Brother   . Heart attack Brother   . Healthy Son   . Healthy Daughter   . Migraines Neg Hx     Social History Social History   Tobacco Use  . Smoking status: Never Smoker  . Smokeless tobacco: Never Used  Substance Use Topics  . Alcohol use: No  . Drug use: No     Allergies   Vancomycin   Review of Systems Review of Systems  Constitutional: Positive for chills  and fatigue. Negative for fever.  HENT: Negative for facial swelling and sore throat.   Respiratory: Positive for cough. Negative for shortness of breath.   Cardiovascular: Negative for chest pain.  Gastrointestinal: Negative for abdominal pain, nausea and vomiting.  Genitourinary: Negative for dysuria.  Musculoskeletal: Negative for back pain.  Skin: Negative for rash and wound.  Neurological: Positive for weakness. Negative for headaches.  Psychiatric/Behavioral: The patient is not nervous/anxious.      Physical Exam Updated Vital Signs BP (!) 169/53 (BP Location: Left Arm)   Pulse 97   Temp 100.1 F (37.8 C) (Oral)   Resp 18   Ht 5\' 9"  (1.753 m)   Wt 85.9 kg   SpO2 96%   BMI 27.97 kg/m   Physical Exam Vitals signs and nursing note reviewed.  Constitutional:      General: He is not in acute distress.    Appearance: He is well-developed. He is not diaphoretic.  HENT:     Head: Normocephalic and atraumatic.     Mouth/Throat:     Mouth: Mucous membranes are dry.     Pharynx: No oropharyngeal exudate.  Eyes:     General: No scleral icterus.       Right eye: No discharge.        Left eye: No discharge.     Conjunctiva/sclera: Conjunctivae normal.     Pupils: Pupils are equal, round, and reactive to  light.  Neck:     Musculoskeletal: Normal range of motion and neck supple.     Thyroid: No thyromegaly.  Cardiovascular:     Rate and Rhythm: Normal rate and regular rhythm.     Heart sounds: Normal heart sounds. No murmur. No friction rub. No gallop.   Pulmonary:     Effort: Pulmonary effort is normal. No respiratory distress.     Breath sounds: Normal breath sounds. No stridor. No wheezing or rales.  Abdominal:     General: Bowel sounds are normal. There is no distension.     Palpations: Abdomen is soft.     Tenderness: There is no abdominal tenderness. There is no guarding or rebound.  Genitourinary:    Comments: Indwelling Foley catheter Lymphadenopathy:     Cervical: No cervical adenopathy.  Skin:    General: Skin is warm and dry.     Coloration: Skin is not pale.     Findings: No rash.  Neurological:     Mental Status: He is alert.     Coordination: Coordination normal.     Comments: Generalized weakness noted, 3/5 strength throughout, equal bilateral grip strength, but weaker than expected; sensation intact      ED Treatments / Results  Labs (all labs ordered are listed, but only abnormal results are displayed) Labs Reviewed  COMPREHENSIVE METABOLIC PANEL - Abnormal; Notable for the following components:      Result Value   Sodium 134 (*)    Glucose, Bld 126 (*)    BUN 29 (*)    Creatinine, Ser 1.59 (*)    Calcium 11.0 (*)    Albumin 2.7 (*)    GFR calc non Af Amer 42 (*)    GFR calc Af Amer 48 (*)    All other components within normal limits  CBC WITH DIFFERENTIAL/PLATELET - Abnormal; Notable for the following components:   WBC 2.7 (*)    RBC 2.64 (*)    Hemoglobin 8.4 (*)    HCT 27.6 (*)    MCV 104.5 (*)  Neutro Abs 0.5 (*)    Monocytes Absolute 1.1 (*)    All other components within normal limits  URINALYSIS, ROUTINE W REFLEX MICROSCOPIC - Abnormal; Notable for the following components:   Glucose, UA >=500 (*)    All other components within normal  limits  SARS CORONAVIRUS 2 BY RT PCR (HOSPITAL ORDER, [place] [facility])  LACTIC ACID, PLASMA  COMPREHENSIVE METABOLIC PANEL  POC OCCULT BLOOD, ED  TROPONIN I (HIGH SENSITIVITY)  TROPONIN I (HIGH SENSITIVITY)    EKG EKG Interpretation  Date/Time:  Friday August 15 2019 13:22:35 EDT Ventricular Rate:  72 PR Interval:    QRS Duration: 110 QT Interval:  347 QTC Calculation: 380 R Axis:   62 Text Interpretation:  Sinus rhythm Nonspecific T abnormalities, anterior leads When compared with ECG of 11/16/2016, 06/25/2011 Nonspecific ST abnormality is now Present Confirmed by Francine Graven [phone]) on 08/15/2019 1:50:01 PM   Radiology Ct Head Wo Contrast  Result Date: 08/15/2019 CLINICAL DATA:  Low grade fever. Body aches, malaise and lethargy for the past day. EXAM: CT HEAD WITHOUT CONTRAST TECHNIQUE: Contiguous axial images were obtained from the base of the skull through the vertex without intravenous contrast. COMPARISON:  Head CT scan 07/24/2019. FINDINGS: Brain: No evidence of acute infarction, hemorrhage, hydrocephalus, extra-axial collection or mass lesion/mass effect. Mild atrophy and chronic microvascular ischemic change noted. Vascular: No hyperdense vessel or unexpected calcification. Skull: Intact.  No focal lesion. Sinuses/Orbits: Negative. Other: None. IMPRESSION: No acute abnormality. Mild atrophy and chronic microvascular ischemic change. Electronically Signed   By: Inge Rise M.D.   On: 08/15/2019 16:07   Dg Chest Portable 1 View  Result Date: 08/15/2019 CLINICAL DATA:  Weakness, cough, malaise and fever for 1-2 days. EXAM: PORTABLE CHEST 1 VIEW COMPARISON:  Single-view of the chest 08/08/2019 and 11/16/2016. CT chest 07/25/2019. FINDINGS: Port-A-Cath is in place, unchanged. Lungs are clear. No pneumothorax or pleural effusion. Heart size is normal. No acute or focal bony abnormality. IMPRESSION: No acute disease. Electronically Signed   By: Inge Rise M.D.   On: 08/15/2019 13:42   Dg Fluoro Guided Needle Plc Aspiration/injection Loc  Result Date: 08/14/2019 CLINICAL DATA:  High-grade B-cell lymphoma EXAM: DIAGNOSTIC LUMBAR PUNCTURE UNDER FLUOROSCOPIC GUIDANCE FLUOROSCOPY TIME:  Fluoroscopy Time:  0 minutes 12 seconds Radiation Exposure Index (if provided by the fluoroscopic device): 3.1 mGy Number of Acquired Spot Images: 1 digital fluoroscopic image capture PROCEDURE: Procedure, benefits, and risks were discussed with the patient, including alternatives. Patient's questions were answered. Written informed consent was obtained. Timeout protocol followed. Patient placed prone. L4-L5 disc space was localized under fluoroscopy. Skin prepped and draped in usual sterile fashion. Skin and soft tissues anesthetized with 2 mL of 1% lidocaine. 22 gauge needle was advanced into the spinal canal where clear colorless CSF was encountered with an opening pressure of 3 cm H2O (measured with patient prone). 11 mL of CSF was obtained in 4 tubes for requested analysis. Procedure tolerated very well by patient without immediate complication. IMPRESSION: Fluoroscopic guided lumbar puncture as above. Electronically Signed   By: Lavonia Dana M.D.   On: 08/14/2019 12:07    Procedures Procedures (including critical care time)  Medications Ordered in ED Medications  0.9 %  sodium chloride infusion ( Intravenous New Bag/Given 08/15/19 1739)  allopurinol (ZYLOPRIM) tablet 300 mg (has no administration in time range)  propranolol (INDERAL) tablet 40 mg (has no administration in time range)  citalopram (CELEXA) tablet 20 mg (has no administration  in time range)  temazepam (RESTORIL) capsule 15 mg (has no administration in time range)  calcitRIOL (ROCALTROL) capsule 0.5 mcg (has no administration in time range)  canagliflozin (INVOKANA) tablet 100 mg (has no administration in time range)  pantoprazole (PROTONIX) EC tablet 40 mg (has no administration in time range)   sucralfate (CARAFATE) 1 GM/10ML suspension 1 g (has no administration in time range)  tamsulosin (FLOMAX) capsule 0.4 mg (has no administration in time range)  folic acid (FOLVITE) tablet 1 mg (has no administration in time range)  gabapentin (NEURONTIN) capsule 600 mg (has no administration in time range)  primidone (MYSOLINE) tablet 75 mg (has no administration in time range)  topiramate (TOPAMAX) tablet 200 mg (has no administration in time range)  ondansetron (ZOFRAN) tablet 4 mg (has no administration in time range)    Or  ondansetron (ZOFRAN) injection 4 mg (has no administration in time range)  polyethylene glycol (MIRALAX / GLYCOLAX) packet 17 g (has no administration in time range)  insulin aspart (novoLOG) injection 0-15 Units (has no administration in time range)  insulin aspart (novoLOG) injection 0-5 Units (has no administration in time range)  sodium chloride 0.9 % bolus 500 mL (0 mLs Intravenous Stopped 08/15/19 1408)  sodium chloride 0.9 % bolus 1,000 mL (0 mLs Intravenous Stopping Infusion hung by another clincian 08/15/19 1631)     Initial Impression / Assessment and Plan / ED Course  I have reviewed the triage vital signs and the nursing notes.  Pertinent labs & imaging results that were available during my care of the patient were reviewed by me and considered in my medical decision making (see chart for details).        Patient presenting with generalized weakness progressing over the past several months, but worse today.  He is also been confused, per his daughter.  Patient's calcium is up to 11, corrected to 12.  Chest x-ray is negative.  CT head negative.  Patient's blood pressure was soft on arrival, but fluids have improved this.  Patient is leukopenic and does have a drop in his hemoglobin.  No reported blood.  I have ordered a Hemoccult card on stool when patient produces, will avoid DRE considering leukopenia in the setting of lymphoma.  I discussed patient case  with Dr. Nehemiah Settle with [facility] who accepts patient for admission.  I appreciate his assistance with the patient.  Patient family updated at bedside and over the phone. I discussed patient case with Dr. Tomi Bamberger who guided the patient's management and agrees with plan.   Final Clinical Impressions(s) / ED Diagnoses   Final diagnoses:  Generalized weakness  Hypercalcemia    ED Discharge Orders    None       Frederica Kuster, PA-C 08/15/19 [place], [place], DO 08/20/19 2354

## 2019-08-15 NOTE — ED Notes (Signed)
Pt with foley on arrival.

## 2019-08-15 NOTE — H&P (Signed)
History and Physical  Daniel Richards [id number] DOB: 06-29-1946 DOA: 08/15/2019  Referring physician: Eliezer Mccoy, PA-C, ED provider PCP: [name], FNP  Outpatient Specialists: oncology  Patient Coming From: home  Chief Complaint: confusion, weakness  HPI: Daniel Richards is a 73 y.o. male with a history of diabetes, hypertension, recent diagnosis of B-cell lymphoma with chemotherapy to start next week.  Patient presents with weakness, chills, confusion that started earlier this morning.  He was brought to the emergency department for evaluation.  His symptoms are slightly worsened than before.  No palliating or provoking factors.  He was admitted in September due to similar complaints of confusion and found to be hypercalcemic.  Emergency Department Course: White count 2.7.  Creatinine approximately baseline.  Calcium 11.0, corrected to 12  Review of Systems:   Pt complains of confusion.  Pt denies any fevers, nausea, vomiting, diarrhea, constipation, abdominal pain, shortness of breath, dyspnea on exertion, orthopnea, cough, wheezing, palpitations, headache, vision changes, lightheadedness, dizziness, melena, rectal bleeding.  Review of systems are otherwise negative  Past Medical History:  Diagnosis Date  . Anxiety and depression   . Diabetes mellitus 06/09/2011   pt taking metformin  . DJD of shoulder    right   . Hyperlipidemia   . Hypertension   . Migraines   . Port-A-Cath in place 08/13/2019  . Tremor   . Vitamin D deficiency    Past Surgical History:  Procedure Laterality Date  . BACK SURGERY    . KNEE SURGERY    . PORTACATH PLACEMENT Left 08/08/2019   Procedure: INSERTION PORT-A-CATH;  Surgeon: Aviva Signs, MD;  Location: [facility];  Service: General;  Laterality: Left;  . SHOULDER SURGERY     Social History:  reports that he has never smoked. He has never used smokeless tobacco. He reports that he does not drink alcohol or use drugs. Patient  lives at home  Allergies  Allergen Reactions  . Vancomycin Hives    Patient received Benadryl to treat the hives    Family History  Problem Relation Age of Onset  . Cancer Sister   . Cancer Sister   . Cancer Brother   . Heart attack Father   . Cancer Brother   . Heart attack Brother   . Healthy Son   . Healthy Daughter   . Migraines Neg Hx       Prior to Admission medications   Medication Sig Start Date End Date Taking? Authorizing Provider  allopurinol (ZYLOPRIM) 300 MG tablet Take 1 tablet (300 mg total) by mouth daily. 08/11/19  Yes Derek Jack, MD  amLODipine (NORVASC) 5 MG tablet Take 1 tablet (5 mg total) by mouth daily. 08/06/19  Yes Gerlene Fee, NP  calcitRIOL (ROCALTROL) 0.5 MCG capsule Take 1 capsule (0.5 mcg total) by mouth daily. 08/06/19  Yes Gerlene Fee, NP  citalopram (CELEXA) 20 MG tablet Take 1 tablet (20 mg total) by mouth at bedtime. 08/06/19  Yes Gerlene Fee, NP  folic acid (FOLVITE) 1 MG tablet Take 1 tablet (1 mg total) by mouth daily. 08/06/19  Yes Gerlene Fee, NP  gabapentin (NEURONTIN) 300 MG capsule Take 2 capsules (600 mg total) by mouth 3 (three) times daily. 08/06/19  Yes Gerlene Fee, NP  INVOKANA 100 MG TABS tablet Take 1 tablet (100 mg total) by mouth daily. 08/06/19  Yes Gerlene Fee, NP  metFORMIN (GLUCOPHAGE) 500 MG tablet Take 1 tablet (500 mg total) by mouth  daily. 08/06/19  Yes Gerlene Fee, NP  pantoprazole (PROTONIX) 40 MG tablet Take 1 tablet (40 mg total) by mouth 2 (two) times daily before a meal. 08/06/19  Yes Gerlene Fee, NP  primidone (MYSOLINE) 50 MG tablet Take 1.5 tablets (75 mg total) by mouth every 8 (eight) hours. 08/06/19  Yes Gerlene Fee, NP  propranolol (INDERAL) 40 MG tablet Take 1 tablet (40 mg total) by mouth 2 (two) times daily. 08/06/19  Yes Gerlene Fee, NP  sucralfate (CARAFATE) 1 GM/10ML suspension Take 10 mLs (1 g total) by mouth 4 (four) times daily -  with meals and at  bedtime. 08/06/19  Yes Gerlene Fee, NP  tamsulosin (FLOMAX) 0.4 MG CAPS capsule Take 1 capsule (0.4 mg total) by mouth daily. Give 30 minutes after same meal daily. Do not open/crush/chew 08/06/19  Yes Gerlene Fee, NP  temazepam (RESTORIL) 15 MG capsule Take 1 capsule (15 mg total) by mouth at bedtime as needed for sleep. 08/11/19  Yes Derek Jack, MD  topiramate (TOPAMAX) 100 MG tablet Take 2 tablets (200 mg total) by mouth at bedtime. 08/06/19  Yes Gerlene Fee, NP  CYCLOPHOSPHAMIDE IV Inject into the vein every 21 ( twenty-one) days. 08/20/19   [provider]  DOXORUBICIN HCL IV Inject into the vein every 21 ( twenty-one) days. 08/20/19   [provider]  lidocaine-prilocaine (EMLA) cream Apply a small amount to port a cath site and cover with plastic wrap 1 hour prior to chemotherapy appointments 08/13/19   Derek Jack, MD  NON FORMULARY Diet: Regular, NAS, Consistent Carbohydrate    [provider]  predniSONE (DELTASONE) 20 MG tablet Take 3 tablets (60 mg total) by mouth daily. Take on days 1-5 of chemotherapy. 08/13/19   Derek Jack, MD  prochlorperazine (COMPAZINE) 10 MG tablet Take 1 tablet (10 mg total) by mouth every 6 (six) hours as needed (Nausea or vomiting). 08/13/19   Derek Jack, MD  riTUXimab in sodium chloride 0.9 % 250 mL Inject into the vein every 21 ( twenty-one) days. 08/20/19   [provider]  vinCRIStine 2 mg in sodium chloride 0.9 % 50 mL Inject 2 mg into the vein every 21 ( twenty-one) days. 08/20/19   [provider]  vitamin B-12 1000 MCG tablet Take 1 tablet (1,000 mcg total) by mouth daily. Patient not taking: Reported on 08/15/2019 08/02/19   Orson [name], MD    Physical Exam: BP (!) 148/62 (BP Location: Left Arm) Comment: Simultaneous filing. User may not have seen previous data.  Pulse 89 Comment: Simultaneous filing. User may not have seen previous data.  Temp 98.9 F (37.2 C)  (Oral)   Resp 18   Wt 83.9 kg   SpO2 96% Comment: Simultaneous filing. User may not have seen previous data.  BMI 25.09 kg/m   . General: Elderly male. Awake and alert and oriented x3, although mildly confused.. No acute cardiopulmonary distress.  Marland Kitchen HEENT: Normocephalic atraumatic.  Right and left ears normal in appearance.  Pupils equal, round, reactive to light. Extraocular muscles are intact. Sclerae anicteric and noninjected.  Moist mucosal membranes. No mucosal lesions.  . Neck: Neck supple without lymphadenopathy. No carotid bruits. No masses palpated.  . Cardiovascular: Regular rate with normal S1-S2 sounds. No murmurs, rubs, gallops auscultated. No JVD.  Marland Kitchen Respiratory: Good respiratory effort with no wheezes, rales, rhonchi. Lungs clear to auscultation bilaterally.  No accessory muscle use. . Abdomen: Soft, nontender, nondistended. Active bowel sounds. No  masses or hepatosplenomegaly  . Skin: No rashes, lesions, or ulcerations.  Dry, warm to touch. 2+ dorsalis pedis and radial pulses. . Musculoskeletal: No calf or leg pain. All major joints not erythematous nontender.  No upper or lower joint deformation.  Good ROM.  No contractures  . Psychiatric: Intact judgment and insight. Pleasant and cooperative. . Neurologic: No focal neurological deficits. Strength is 5/5 and symmetric in upper and lower extremities.  Cranial nerves II through XII are grossly intact.           Labs on Admission: I have personally reviewed following labs and imaging studies  CBC: Recent Labs  Lab 08/15/19 1205  WBC 2.7*  NEUTROABS 0.5*  HGB 8.4*  HCT 27.6*  MCV 104.5*  PLT [id number]   Basic Metabolic Panel: Recent Labs  Lab 08/15/19 1205  NA 134*  K 4.7  CL 102  CO2 24  GLUCOSE 126*  BUN 29*  CREATININE 1.59*  CALCIUM 11.0*   GFR: Estimated Creatinine Clearance: 44.1 mL/min (A) (by C-G formula based on SCr of 1.59 mg/dL (H)). Liver Function Tests: Recent Labs  Lab 08/15/19 1205  AST 17   ALT 14  ALKPHOS 68  BILITOT 0.4  PROT 6.7  ALBUMIN 2.7*   No results for input(s): LIPASE, AMYLASE in the last 168 hours. No results for input(s): AMMONIA in the last 168 hours. Coagulation Profile: No results for input(s): INR, PROTIME in the last 168 hours. Cardiac Enzymes: No results for input(s): CKTOTAL, CKMB, CKMBINDEX, TROPONINI in the last 168 hours. BNP (last 3 results) No results for input(s): PROBNP in the last 8760 hours. HbA1C: No results for input(s): HGBA1C in the last 72 hours. CBG: No results for input(s): GLUCAP in the last 168 hours. Lipid Profile: No results for input(s): CHOL, HDL, LDLCALC, TRIG, CHOLHDL, LDLDIRECT in the last 72 hours. Thyroid Function Tests: No results for input(s): TSH, T4TOTAL, FREET4, T3FREE, THYROIDAB in the last 72 hours. Anemia Panel: No results for input(s): VITAMINB12, FOLATE, FERRITIN, TIBC, IRON, RETICCTPCT in the last 72 hours. Urine analysis:    Component Value Date/Time   COLORURINE YELLOW 08/15/2019 1356   APPEARANCEUR CLEAR 08/15/2019 1356   LABSPEC 1.017 08/15/2019 1356   PHURINE 5.0 08/15/2019 1356   GLUCOSEU >=500 (A) 08/15/2019 1356   HGBUR NEGATIVE 08/15/2019 1356   [place] 08/15/2019 1356   [place] 08/15/2019 1356   PROTEINUR NEGATIVE 08/15/2019 1356   UROBILINOGEN 1.0 11/18/2014 1238   NITRITE NEGATIVE 08/15/2019 1356   LEUKOCYTESUR NEGATIVE 08/15/2019 1356   Sepsis Labs: @LABRCNTIP (procalcitonin:4,lacticidven:4) ) Recent Results (from the past 240 hour(s))  SARS Coronavirus 2 by RT PCR (hospital order, performed in [place] hospital lab) Nasopharyngeal Nasopharyngeal Swab     Status: None   Collection Time: 08/15/19 12:39 PM   Specimen: Nasopharyngeal Swab  Result Value Ref Range Status   SARS Coronavirus 2 NEGATIVE NEGATIVE Final    Comment: (NOTE) If result is NEGATIVE SARS-CoV-2 target nucleic acids are NOT DETECTED. The SARS-CoV-2 RNA is generally detectable in upper and  lower  respiratory specimens during the acute phase of infection. The lowest  concentration of SARS-CoV-2 viral copies this assay can detect is 250  copies / mL. A negative result does not preclude SARS-CoV-2 infection  and should not be used as the sole basis for treatment or other  patient management decisions.  A negative result may occur with  improper specimen collection / handling, submission of specimen other  than nasopharyngeal swab, presence of viral mutation(s)  within the  areas targeted by this assay, and inadequate number of viral copies  (<250 copies / mL). A negative result must be combined with clinical  observations, patient history, and epidemiological information. If result is POSITIVE SARS-CoV-2 target nucleic acids are DETECTED. The SARS-CoV-2 RNA is generally detectable in upper and lower  respiratory specimens dur ing the acute phase of infection.  Positive  results are indicative of active infection with SARS-CoV-2.  Clinical  correlation with patient history and other diagnostic information is  necessary to determine patient infection status.  Positive results do  not rule out bacterial infection or co-infection with other viruses. If result is PRESUMPTIVE POSTIVE SARS-CoV-2 nucleic acids MAY BE PRESENT.   A presumptive positive result was obtained on the submitted specimen  and confirmed on repeat testing.  While 2019 novel coronavirus  (SARS-CoV-2) nucleic acids may be present in the submitted sample  additional confirmatory testing may be necessary for epidemiological  and / or clinical management purposes  to differentiate between  SARS-CoV-2 and other Sarbecovirus currently known to infect humans.  If clinically indicated additional testing with an alternate test  methodology [phone]) is advised. The SARS-CoV-2 RNA is generally  detectable in upper and lower respiratory sp ecimens during the acute  phase of infection. The expected result is Negative.  Fact Sheet for Patients:  StrictlyIdeas.no Fact Sheet for Healthcare Providers: BankingDealers.co.za This test is not yet approved or cleared by the [place] FDA and has been authorized for detection and/or diagnosis of SARS-CoV-2 by FDA under an Emergency Use Authorization (EUA).  This EUA will remain in effect (meaning this test can be used) for the duration of the COVID-19 declaration under Section 564(b)(1) of the Act, 21 U.S.C. section 360bbb-3(b)(1), unless the authorization is terminated or revoked sooner. Performed at [facility], [address], [place], [place] [address]      Radiological Exams on Admission: Ct Head Wo Contrast  Result Date: 08/15/2019 CLINICAL DATA:  Low grade fever. Body aches, malaise and lethargy for the past day. EXAM: CT HEAD WITHOUT CONTRAST TECHNIQUE: Contiguous axial images were obtained from the base of the skull through the vertex without intravenous contrast. COMPARISON:  Head CT scan 07/24/2019. FINDINGS: Brain: No evidence of acute infarction, hemorrhage, hydrocephalus, extra-axial collection or mass lesion/mass effect. Mild atrophy and chronic microvascular ischemic change noted. Vascular: No hyperdense vessel or unexpected calcification. Skull: Intact.  No focal lesion. Sinuses/Orbits: Negative. Other: None. IMPRESSION: No acute abnormality. Mild atrophy and chronic microvascular ischemic change. Electronically Signed   By: Inge Rise M.D.   On: 08/15/2019 16:07   Dg Chest Portable 1 View  Result Date: 08/15/2019 CLINICAL DATA:  Weakness, cough, malaise and fever for 1-2 days. EXAM: PORTABLE CHEST 1 VIEW COMPARISON:  Single-view of the chest 08/08/2019 and 11/16/2016. CT chest 07/25/2019. FINDINGS: Port-A-Cath is in place, unchanged. Lungs are clear. No pneumothorax or pleural effusion. Heart size is normal. No acute or focal bony abnormality. IMPRESSION: No acute disease. Electronically Signed    By: Inge Rise M.D.   On: 08/15/2019 13:42   Dg Fluoro Guided Needle Plc Aspiration/injection Loc  Result Date: 08/14/2019 CLINICAL DATA:  High-grade B-cell lymphoma EXAM: DIAGNOSTIC LUMBAR PUNCTURE UNDER FLUOROSCOPIC GUIDANCE FLUOROSCOPY TIME:  Fluoroscopy Time:  0 minutes 12 seconds Radiation Exposure Index (if provided by the fluoroscopic device): 3.1 mGy Number of Acquired Spot Images: 1 digital fluoroscopic image capture PROCEDURE: Procedure, benefits, and risks were discussed with the patient, including alternatives. Patient's questions were answered. Written  informed consent was obtained. Timeout protocol followed. Patient placed prone. L4-L5 disc space was localized under fluoroscopy. Skin prepped and draped in usual sterile fashion. Skin and soft tissues anesthetized with 2 mL of 1% lidocaine. 22 gauge needle was advanced into the spinal canal where clear colorless CSF was encountered with an opening pressure of 3 cm H2O (measured with patient prone). 11 mL of CSF was obtained in 4 tubes for requested analysis. Procedure tolerated very well by patient without immediate complication. IMPRESSION: Fluoroscopic guided lumbar puncture as above. Electronically Signed   By: Lavonia Dana M.D.   On: 08/14/2019 12:07    EKG: Independently reviewed.  Sinus rhythm with borderline repolarization abnormality  Assessment/Plan: Active Problems:   Essential tremor   Hypercalcemia   Type 2 diabetes, controlled, with peripheral neuropathy (HCC)   B-cell lymphoma (HCC)   BPH without urinary obstruction   Diabetic peripheral neuropathy ([place])   Hypertension associated with type 2 diabetes mellitus ([place])    This patient was discussed with the ED physician, including pertinent vitals, physical exam findings, labs, and imaging.  We also discussed care given by the ED provider.  1. Hypercalcemia a. Observation on telemetry b. IV fluids c. Recheck calcium tomorrow 2. B-Cell lymphoma. a. To start  treatment next week 3. Diabetes a. Hold metformin b. CBGs before meals and nightly sliding scale insulin 4. Hypertension a. Blood pressure little soft.  Will hold antihypertensives 5. Neuropathy a. Continue gabapentin 6. BPH a. Continue Flomax  DVT prophylaxis: SCDs as patient is slightly more anemic and Hemoccult pending Consultants: None Code Status: Full code confirmed with patient Family Communication: Son present during interview and exam Disposition Plan: Patient should be able to return home following improvement of hypercalcemia   Truett Mainland, DO

## 2019-08-15 NOTE — ED Notes (Signed)
Report to Emily, RN

## 2019-08-15 NOTE — ED Notes (Signed)
Pt. Was unable to stand for orthostatic vitals.

## 2019-08-15 NOTE — ED Notes (Signed)
Updated Tammy (pt's daughter)

## 2019-08-16 LAB — COMPREHENSIVE METABOLIC PANEL
ALT: 17 U/L (ref 0–44)
AST: 23 U/L (ref 15–41)
Albumin: 2.7 g/dL — ABNORMAL LOW (ref 3.5–5.0)
Alkaline Phosphatase: 68 U/L (ref 38–126)
Anion gap: 12 (ref 5–15)
BUN: 21 mg/dL (ref 8–23)
CO2: 20 mmol/L — ABNORMAL LOW (ref 22–32)
Calcium: 10.1 mg/dL (ref 8.9–10.3)
Chloride: 103 mmol/L (ref 98–111)
Creatinine, Ser: 1.37 mg/dL — ABNORMAL HIGH (ref 0.61–1.24)
GFR calc Af Amer: 58 mL/min — ABNORMAL LOW (ref >60)
GFR calc non Af Amer: 50 mL/min — ABNORMAL LOW (ref >60)
Glucose, Bld: 107 mg/dL — ABNORMAL HIGH (ref 70–99)
Potassium: 3.9 mmol/L (ref 3.5–5.1)
Sodium: 135 mmol/L (ref 135–145)
Total Bilirubin: 0.9 mg/dL (ref 0.3–1.2)
Total Protein: 6.7 g/dL (ref 6.5–8.1)

## 2019-08-16 LAB — GLUCOSE, CAPILLARY
Glucose-Capillary: 106 mg/dL — ABNORMAL HIGH (ref 70–99)
Glucose-Capillary: 117 mg/dL — ABNORMAL HIGH (ref 70–99)
Glucose-Capillary: 92 mg/dL (ref 70–99)
Glucose-Capillary: 104 mg/dL — ABNORMAL HIGH (ref 70–99)

## 2019-08-16 NOTE — Progress Notes (Signed)
Patient Demographics:    Daniel Richards, is a 73 y.o. 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 male with a history of diabetes, hypertension, recent diagnosis of B-cell lymphoma with chemotherapy to start next week.    Admitted on 08/15/2019 with weakness, fevers, chills and confusion    A/P 1)Hypercalcemia-suspect malignancy related, responding well to IV fluids, continue gentle hydration.  Hold off on pamidronate or calcitonin at this time  2) B-cell lymphoma--- follow-up with Dr. Delton Coombes next week as previously planned for initiation of chemotherapy  3)BPH with LUTs--- continue indwelling Foley urine does not look infected  4) fevers/chills/weakness--- fevers have resolved, chest x-ray without acute findings, UA does not look infected, blood cultures are pending.  Hold off on antibiotics  5) acute metabolic encephalopathy--confusional episodes have resolved, suspect it was due to combination of fevers and hypercalcemia--continue to hydrate  6)DM-excellent control, A1c 5.6, stop Invokana, mean, use Novolog/Humalog Sliding scale insulin with Accu-Cheks/Fingersticks as ordered\  7) depressive disorder--stable, continue Celexa, as  well as Restoril as needed for sleep  8)HTN--BP meds on hold due to soft BP    Disposition/Need for in-Hospital Stay- patient unable to be discharged at this time due to -malignancy related hypercalcemia requiring IV fluids -Metabolic encephalopathy requiring cultures and work-up for possible infection*  Code Status : Full  Family Communication:   (patient is alert, awake and coherent) -Discussed with patient's son at bedside, questions answered  Disposition Plan  : Possible discharge home in 1 to 2 days if hypercalcemia continues to improve with IV fluids  Consults  :  na  DVT Prophylaxis  :   - Heparin - SCDs Lab Results  Component Value Date   PLT 226 08/15/2019    Inpatient Medications  Scheduled Meds:  allopurinol  300 mg Oral Daily   calcitRIOL  0.5 mcg Oral Daily   canagliflozin  100 mg Oral Daily   Chlorhexidine Gluconate Cloth  6 each Topical Daily   citalopram  20 mg Oral QHS   folic acid  1 mg Oral Daily   gabapentin  600 mg Oral TID   insulin aspart  0-15 Units Subcutaneous TID WC   insulin aspart  0-5 Units Subcutaneous QHS   pantoprazole  40 mg Oral BID AC   primidone  75 mg Oral Q8H   propranolol  40 mg Oral BID   sucralfate  1 g Oral TID WC & HS   tamsulosin  0.4 mg Oral Daily   topiramate  200 mg Oral QHS   Continuous Infusions:  sodium chloride 100 mL/hr at 08/16/19 1616   PRN Meds:.acetaminophen, ondansetron **OR** ondansetron (ZOFRAN) IV, polyethylene glycol, temazepam    Anti-infectives (From admission, onward)   None        Objective:   Vitals:   08/15/19 2109 08/16/19 0615 08/16/19 0756 08/16/19 1355  BP: (!) 149/66 (!) 145/71  (!) 119/51  Pulse: (!) 106 (!) 104  80  Resp: 20 18  19   Temp: (!) 101.8 F (38.8 C) 99.6 F (37.6 C)  99.6 F (37.6 C)  TempSrc: Oral Oral  Oral  SpO2: 96% 96% 95% 98%  Weight:      Height:        Wt Readings from Last 3 Encounters:  08/15/19 85.9 kg  08/11/19 83.9 kg  08/06/19 86  kg     Intake/Output Summary (Last 24 hours) at 08/16/2019 1704 Last data filed at 08/16/2019 1500 Gross per 24 hour  Intake 2933.23 ml  Output 1700 ml  Net 1233.23 ml     Physical Exam  Gen:- Awake Alert,  In no apparent distress  HEENT:- [place].AT, No sclera icterus Neck-Supple Neck,No JVD,.  Lungs-  CTAB , fair symmetrical air movement,  CV- S1, S2 normal, regular, Port-A-Cath in situ Abd-  +ve B.Sounds, Abd Soft, No tenderness,    Extremity/Skin:- No  edema, pedal pulses present  Psych-affect is appropriate, oriented x3 Neuro-generalized weakness no new focal deficits, no tremors GU-Foley with clear urine   Data Review:   Micro Results Recent Results (from the past 240 hour(s))  SARS Coronavirus 2 by RT PCR (hospital order, performed in [facility] hospital lab) Nasopharyngeal Nasopharyngeal Swab     Status: None   Collection Time: 08/15/19 12:39 PM   Specimen: Nasopharyngeal Swab  Result Value Ref Range Status   SARS Coronavirus 2 NEGATIVE NEGATIVE Final    Comment: (NOTE) If result is NEGATIVE SARS-CoV-2 target nucleic acids are NOT DETECTED. The SARS-CoV-2 RNA is generally detectable in upper and lower  respiratory specimens during the acute phase of infection. The lowest  concentration of SARS-CoV-2 viral copies this assay can detect is 250  copies / mL. A negative result does not preclude SARS-CoV-2 infection  and should not be used as the sole basis for treatment or other  patient management decisions.  A negative result may occur with  improper specimen collection / handling, submission of specimen other  than nasopharyngeal swab, presence of viral mutation(s) within the  areas targeted by this assay, and inadequate number of viral copies  (<250 copies / mL). A negative result must be combined with clinical  observations, patient history, and epidemiological information. If result is POSITIVE SARS-CoV-2 target nucleic acids are DETECTED. The SARS-CoV-2 RNA is  generally detectable in upper and lower  respiratory specimens dur ing the acute phase of infection.  Positive  results are indicative of active infection with SARS-CoV-2.  Clinical  correlation with patient history and other diagnostic information is  necessary to determine patient infection status.  Positive results do  not rule out bacterial infection or co-infection with other viruses. If result is PRESUMPTIVE POSTIVE SARS-CoV-2 nucleic acids MAY BE PRESENT.   A presumptive positive result was obtained on the submitted specimen  and confirmed on repeat testing.  While 2019 novel coronavirus  (SARS-CoV-2) nucleic acids may be present in the submitted sample  additional confirmatory testing may be necessary for epidemiological  and / or clinical management purposes  to differentiate between  SARS-CoV-2 and other Sarbecovirus currently known to infect humans.  If clinically indicated additional testing with an alternate test  methodology [phone]) is advised. The SARS-CoV-2 RNA is generally  detectable in upper and lower respiratory sp ecimens during the acute  phase of infection. The expected result is Negative. Fact Sheet for Patients:  StrictlyIdeas.no Fact Sheet for Healthcare Providers: BankingDealers.co.za This test is not yet approved or cleared by the [place] FDA and has been authorized for detection and/or diagnosis of SARS-CoV-2 by FDA under an Emergency Use Authorization (EUA).  This EUA will remain in effect (meaning this test can be used) for the duration of the COVID-19 declaration under Section 564(b)(1) of the Act, 21 U.S.C. section 360bbb-3(b)(1), unless the authorization is terminated or revoked sooner. Performed at [facility], [address], [place], [place] [address]   Culture, blood (Routine X 2) w Reflex to ID Panel     Status: None (Preliminary result)   Collection Time: 08/16/19  9:13 AM   Specimen: Left  Antecubital; Blood  Result Value Ref Range Status   Specimen Description   Final    LEFT ANTECUBITAL BOTTLES DRAWN AEROBIC AND ANAEROBIC   Special Requests   Final    Blood Culture adequate volume Performed at [facility], [address], [place], [place] [address]    Culture PENDING  Incomplete   Report Status PENDING  Incomplete  Culture, blood (Routine X 2) w Reflex to ID Panel     Status: None (Preliminary result)   Collection Time: 08/16/19  9:14 AM   Specimen: BLOOD LEFT FOREARM  Result Value Ref Range Status   Specimen Description   Final    BLOOD LEFT FOREARM BOTTLES DRAWN AEROBIC AND ANAEROBIC   Special Requests   Final    Blood Culture adequate volume Performed at [facility], [address] [place], [place] [address]    Culture PENDING  Incomplete   Report Status PENDING  Incomplete    Radiology Reports Ct Head Wo Contrast  Result Date: 08/15/2019 CLINICAL DATA:  Low grade fever. Body aches, malaise and lethargy for the past day. EXAM: CT HEAD WITHOUT CONTRAST TECHNIQUE: Contiguous axial images were obtained from the base of the skull through the vertex without intravenous contrast. COMPARISON:  Head CT scan 07/24/2019. FINDINGS: Brain: No evidence of acute infarction, hemorrhage, hydrocephalus, extra-axial collection or mass lesion/mass effect. Mild atrophy and chronic microvascular ischemic change noted. Vascular: No hyperdense vessel or unexpected calcification. Skull: Intact.  No focal lesion. Sinuses/Orbits: Negative. Other: None. IMPRESSION: No acute abnormality. Mild atrophy and chronic microvascular ischemic change. Electronically Signed   By: Inge Rise M.D.   On: 08/15/2019 16:07   Ct Head Wo Contrast  Result Date: 07/24/2019 CLINICAL DATA:  Trauma. EXAM: CT HEAD WITHOUT CONTRAST CT CERVICAL SPINE WITHOUT CONTRAST TECHNIQUE: Multidetector CT imaging of the head and cervical spine was performed following the standard protocol without intravenous contrast.  Multiplanar CT image reconstructions of the cervical spine were also generated. COMPARISON:  CT scan of November 16, 2016. FINDINGS: CT HEAD FINDINGS Brain: Mild chronic ischemic white matter disease is noted. No mass effect or midline shift is noted. Ventricular size is within normal limits. There is no evidence of mass lesion, hemorrhage or acute infarction. Vascular: No hyperdense vessel or unexpected calcification. Skull: Normal. Negative for fracture or focal lesion. Sinuses/Orbits: Right frontal and ethmoid mucosal thickening is noted. Other: None. CT CERVICAL SPINE FINDINGS Alignment: Normal. Skull base and vertebrae: No acute fracture. No primary bone  lesion or focal pathologic process. Soft tissues and spinal canal: No prevertebral fluid or swelling. No visible canal hematoma. Disc levels: Status post surgical anterior fusion of C5-6. Mild anterior osteophyte formation is also noted at C4-5 and C6-7. Upper chest: Negative. Other: None. IMPRESSION: Mild chronic ischemic white matter disease. No acute intracranial abnormality seen. Postsurgical and degenerative changes are noted in the cervical spine. No fracture or significant spondylolisthesis is noted. Electronically Signed   By: Marijo Conception M.D.   On: 07/24/2019 16:20   Ct Chest W Contrast  Result Date: 07/25/2019 CLINICAL DATA:  Abdominal lymphadenopathy, evaluate chest, history of recent falls and weakness EXAM: CT CHEST WITH CONTRAST TECHNIQUE: Multidetector CT imaging of the chest was performed during intravenous contrast administration. CONTRAST:  65mL OMNIPAQUE IOHEXOL 300 MG/ML  SOLN COMPARISON:  CT abdomen pelvis, 07/24/2019, CT chest, 09/20/2009 FINDINGS: Cardiovascular: Aortic atherosclerosis. Normal heart size. Extensive 3 vessel coronary artery calcifications. No pericardial effusion. Mediastinum/Nodes: There is an enlarged paraesophageal lymph node measuring 2.0 x 1.4 cm at the level of T6-T7 (series 2, image 71). There are prominent  periaortic and retrocrural nodes lower in the thorax (series 2, image 135, 153). There are enlarged left lower cervical/superior mediastinal lymph nodes measuring up to 1.2 x 1.0 cm (series 2, image 7). No other enlarged mediastinal, hilar, or axillary lymph nodes. Thyroid gland, trachea, and esophagus demonstrate no significant findings. Lungs/Pleura: Lungs are clear. No pleural effusion or pneumothorax. Upper Abdomen: No acute abnormality. Bulky retroperitoneal lymphadenopathy, better assessed on prior CT of the abdomen pelvis. Musculoskeletal: No chest wall mass or suspicious bone lesions identified. IMPRESSION: 1. There is an enlarged paraesophageal lymph node measuring 2.0 x 1.4 cm at the level of T6-T7 (series 2, image 71). There are prominent periaortic and retrocrural nodes lower in the thorax (series 2, image 135, 153). 2. There are enlarged left lower cervical/superior mediastinal lymph nodes measuring up to 1.2 x 1.0 cm (series 2, image 7). 3.  Coronary artery disease.  Aortic Atherosclerosis (ICD10-I70.0). Electronically Signed   By: Eddie Candle M.D.   On: 07/25/2019 14:51   Ct Cervical Spine Wo Contrast  Result Date: 07/24/2019 CLINICAL DATA:  Trauma. EXAM: CT HEAD WITHOUT CONTRAST CT CERVICAL SPINE WITHOUT CONTRAST TECHNIQUE: Multidetector CT imaging of the head and cervical spine was performed following the standard protocol without intravenous contrast. Multiplanar CT image reconstructions of the cervical spine were also generated. COMPARISON:  CT scan of November 16, 2016. FINDINGS: CT HEAD FINDINGS Brain: Mild chronic ischemic white matter disease is noted. No mass effect or midline shift is noted. Ventricular size is within normal limits. There is no evidence of mass lesion, hemorrhage or acute infarction. Vascular: No hyperdense vessel or unexpected calcification. Skull: Normal. Negative for fracture or focal lesion. Sinuses/Orbits: Right frontal and ethmoid mucosal thickening is noted.  Other: None. CT CERVICAL SPINE FINDINGS Alignment: Normal. Skull base and vertebrae: No acute fracture. No primary bone lesion or focal pathologic process. Soft tissues and spinal canal: No prevertebral fluid or swelling. No visible canal hematoma. Disc levels: Status post surgical anterior fusion of C5-6. Mild anterior osteophyte formation is also noted at C4-5 and C6-7. Upper chest: Negative. Other: None. IMPRESSION: Mild chronic ischemic white matter disease. No acute intracranial abnormality seen. Postsurgical and degenerative changes are noted in the cervical spine. No fracture or significant spondylolisthesis is noted. Electronically Signed   By: Marijo Conception M.D.   On: 07/24/2019 16:20   Mr Thoracic Spine Wo Contrast  Result  Date: 07/30/2019 CLINICAL DATA:  Suspected lymphoma.  Back pain and leg weakness. EXAM: MRI THORACIC SPINE WITHOUT CONTRAST TECHNIQUE: Multiplanar, multisequence MR imaging of the thoracic spine was performed. No intravenous contrast was administered. COMPARISON:  MRI 11/17/2016 FINDINGS: Alignment:  Normal Vertebrae: Normal.  No fracture or marrow space abnormality. Cord:  Normal.  No cord compression or primary cord lesion. Paraspinal and other soft tissues: Paraesophageal node as shown by CT. Tiny amount of pleural fluid. Disc levels: No significant disc disease in the thoracic region. No disc herniation or stenosis of the canal or foramina. No significant facet arthropathy. IMPRESSION: No significant thoracic spinal abnormality. No significant degenerative disease, stenosis or nerve compression. No evidence of fracture or bone marrow lesion. Paraesophageal lymph node as shown by CT. Electronically Signed   By: Nelson Chimes M.D.   On: 07/30/2019 15:15   Mr Lumbar Spine Wo Contrast  Result Date: 07/30/2019 CLINICAL DATA:  Generalized weakness and back pain. Frequent falling. EXAM: MRI LUMBAR SPINE WITHOUT CONTRAST TECHNIQUE: Multiplanar, multisequence MR imaging of the lumbar  spine was performed. No intravenous contrast was administered. COMPARISON:  CT 07/24/2019.  MRI 11/17/2016 FINDINGS: Segmentation:  5 lumbar type vertebral bodies. Alignment:  Normal Vertebrae:  No fracture or primary bone lesion. Conus medullaris and cauda equina: Conus extends to the L1 level. Conus and cauda equina appear normal. Paraspinal and other soft tissues: Massive retroperitoneal lymphadenopathy as seen on the recent CT. Not present in January of 2018. Disc levels: No significant finding at T12-L1 or L1-2. L2-3: Mild desiccation and bulging of the disc. No compressive stenosis. L3-4: Normal interspace. L4-5: Mild bulging of the disc. No compressive stenosis. Mild facet osteoarthritis. L5-S1: Previous right hemilaminectomy. Right posterolateral disc herniation/scarring with narrowing of the right lateral recess that could compress the right S1 nerve. Similar appearance to the study of 2018. IMPRESSION: Massive retroperitoneal lymphadenopathy. No evidence of marrow space tumor or fracture. Previous right hemilaminectomy at L5-S1. Right lateral recess stenosis because of protruding disc material and scarring could possibly affect the right S1 nerve. Appearance is unchanged since 2018. Electronically Signed   By: Nelson Chimes M.D.   On: 07/30/2019 15:12   Ct Abdomen Pelvis W Contrast  Result Date: 07/24/2019 CLINICAL DATA:  Bilateral leg weakness. Patient fell in the bathtub today. EXAM: CT ABDOMEN AND PELVIS WITH CONTRAST TECHNIQUE: Multidetector CT imaging of the abdomen and pelvis was performed using the standard protocol following bolus administration of intravenous contrast. CONTRAST:  24mL OMNIPAQUE IOHEXOL 300 MG/ML SOLN, 58mL OMNIPAQUE IOHEXOL 300 MG/ML SOLN COMPARISON:  06/09/2011 FINDINGS: Lower chest: Minor linear atelectasis or scarring in the anterior lung bases. Lung bases otherwise clear. Heart normal in size. Hepatobiliary: No focal liver abnormality is seen. No gallstones, gallbladder  wall thickening, or biliary dilatation. Pancreas: Unremarkable. No pancreatic ductal dilatation or surrounding inflammatory changes. Spleen: Normal in size without focal abnormality. Adrenals/Urinary Tract: No adrenal masses. Kidneys are normal in position. There is bilateral renal cortical thinning. 18 mm low-attenuation mass, posterior midpole the left kidney, consistent with a cyst. No other renal masses. Small nonobstructing stones in both kidneys. No hydronephrosis. Ureters normal in course and in caliber. No ureteral stones. Bladder moderately distended. There are dependent stones with some mild increased attenuation in the dependent bladder is well, likely debris. No wall thickening or convincing mass. Stomach/Bowel: Stomach is unremarkable. Small bowel and colon are normal in caliber. No wall thickening. No inflammation. Mild increased colonic stool burden. No evidence of appendicitis. Appendix not visualized. Vascular/Lymphatic:  There is extensive adenopathy. Bulky retroperitoneal adenopathy is noted extending from level of the adrenal glands into the pelvis. Node at the level of the midpole the left kidney, left periaortic, measures 4.3 cm in short axis. Left periaortic node at the level of the bifurcation measures 4.3 cm short axis. There are bulky left pelvic lymph nodes. Posteroinferior pelvic node, to the left of the lower sigmoid colon, measures 7.4 x 4.6 cm transversely. Adjacent left external iliac chain node measures 7.2 x 3.5 cm adenopathy surrounds the common iliac veins, greater on the left, as well as the left external iliac vein. It is not defined and may be occluded. No mesenteric adenopathy. No enlarged inguinal lymph nodes. Dense aortic atherosclerosis.  No aneurysm. Reproductive: Prostate is enlarged measuring 5.9 x 5.5 x 5.6 cm. Other: No abdominal wall hernia or abnormality. No abdominopelvic ascites. Musculoskeletal: No fracture or acute finding. No osteoblastic or osteolytic lesions.  IMPRESSION: 1. There is bulky retroperitoneal and left pelvic lymphadenopathy that has developed since the prior CT. Adenopathy may occlude the left common and/or external iliac vein. Differential diagnosis includes lymphoma and other lymphoproliferative disorders. Consider the possibility of prostate carcinoma metastatic to lymph nodes. 2. No acute findings within the abdomen or pelvis. 3. Bilateral intrarenal stones. Dependent bladder stones as well as a small amount of dependent bladder debris. 4. Bilateral renal cortical thinning. 5. Aortic atherosclerosis. Electronically Signed   By: Lajean Manes M.D.   On: 07/24/2019 20:21   US Renal  Result Date: 07/25/2019 CLINICAL DATA:  Acute kidney injury. EXAM: RENAL / URINARY TRACT ULTRASOUND COMPLETE COMPARISON:  CT abdomen and pelvis 07/24/2019. FINDINGS: Right Kidney: Renal measurements: 10.4 x 5.1 x 4.9 cm = volume: 138 mL . Echogenicity within normal limits. No mass or hydronephrosis visualized. Cortex is thinned. Punctate nonobstructing stone in the lower pole seen on prior CT noted. Left Kidney: Renal measurements: 11.2 x 5.9 x 5.1 cm = volume: 180 mL. Echogenicity within normal limits. No solid mass or hydronephrosis visualized. Cortex is mildly thinned. Simple 2.1 cm cyst noted. Small nonobstructing stones are seen as on prior CT. Bladder: Bladder calculi are identified as seen on prior CT. The bladder is distended. IMPRESSION: No acute abnormality.  Negative for hydronephrosis. Bilateral nonobstructing renal stones, more numerous on the left. Distended urinary bladder. Electronically Signed   By: Inge Rise M.D.   On: 07/25/2019 14:48   Nm Pet Image Initial (pi) Skull Base To Thigh  Result Date: 08/07/2019 CLINICAL DATA:  Initial treatment strategy for staging of lymphoma. EXAM: NUCLEAR MEDICINE PET SKULL BASE TO THIGH TECHNIQUE: 9.4 mCi F-18 FDG was injected intravenously. Full-ring PET imaging was performed from the skull base to thigh after  the radiotracer. CT data was obtained and used for attenuation correction and anatomic localization. Fasting blood glucose: 143 mg/dl COMPARISON:  Chest CT 07/25/2019.  Abdominopelvic CT 07/24/2019. FINDINGS: Mediastinal blood pool activity: SUV max 2.3 Liver activity: SUV max 4.0 NECK: Right ethmoid air cell likely expansile soft tissue density corresponding to hypermetabolism. Example 2.3 x 1.5 cm and a S.U.V. max of 9.3 on 08/04. Low left jugular/supraclavicular nodes at up to 7 mm and a S.U.V. max of 5.9 on 41/4. Incidental CT findings: Bilateral carotid atherosclerosis. CHEST: Thoracic nodal hypermetabolism. Example low subcarinal/periesophageal node of 1.1 cm and a S.U.V. max of 11.6 on 77/4. Retrocrural node measures 8 mm and a S.U.V. max of 8.1 on image 107/4. Incidental CT findings: Mild cardiomegaly. Multivessel coronary artery atherosclerosis. Aortic atherosclerosis. Emphysema. ABDOMEN/PELVIS:  Bulky abdominal hypermetabolic adenopathy. An index nodal mass in the low left periaortic station measures 4.1 x 4.4 cm and a S.U.V. max of 16.1 on image 146/4. Massive left pelvic nodal conglomerate measures on the order of 13.9 x 5.7 cm and a S.U.V. max of 14.7 on 180/4. Incidental CT findings: Abdominal aortic atherosclerosis. Punctate bilateral renal collecting system calculi. Colonic stool burden suggests constipation. Foley catheter within the urinary bladder. The bladder is decompressed, but appears thick walled. Bladder stones and surrounding pericystic edema. Mild prostatomegaly. SKELETON: Heterogeneous foci of marrow hypermetabolism. Example within the sternum at a S.U.V. max of 9.3. Incidental CT findings: Cervical spine fixation. IMPRESSION: 1. 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 Heterogeneous foci of marrow hypermetabolism, consistent with lymphomatous involvement. 2. Right sphenoid sinus soft tissue density and hypermetabolism. Especially given suggestion  of expansion in this area, favored to represent lymphomatous involvement. Sinusitis could look similar. 3. Aortic atherosclerosis (ICD10-I70.0), coronary artery atherosclerosis and emphysema (ICD10-J43.9). 4. Prostatomegaly. Findings of bladder outlet obstruction, including pericystic edema, wall thickening, and bladder stones. Cystitis cannot be excluded. 5. Bilateral nephrolithiasis. Electronically Signed   By: Abigail Miyamoto M.D.   On: 08/07/2019 16:30   Ct Biopsy  Result Date: 07/30/2019 INDICATION: 73 year old male with newly diagnosed extensive pelvic and retroperitoneal lymphadenopathy concerning for lymphoma versus metastatic pancreatic cancer. EXAM: CT-guided biopsy retroperitoneal lymph node MEDICATIONS: None. ANESTHESIA/SEDATION: Moderate (conscious) sedation was employed during this procedure. A total of Versed 1 mg and Fentanyl 50 mcg was administered intravenously. Moderate Sedation Time: 10 minutes. The patient's level of consciousness and vital signs were monitored continuously by radiology nursing throughout the procedure under my direct supervision. FLUOROSCOPY TIME:  None COMPLICATIONS: None immediate. PROCEDURE: Informed written consent was obtained from the patient after a thorough discussion of the procedural risks, benefits and alternatives. All questions were addressed. Maximal Sterile Barrier Technique was utilized including caps, mask, sterile gowns, sterile gloves, sterile drape, hand hygiene and skin antiseptic. A timeout was performed prior to the initiation of the procedure. A planning axial CT scan was performed. A suitable left retroperitoneal lymph node was identified. A suitable skin entry site was selected and marked. After sterile prep and drape in the standard fashion with chlorhexidine skin prep, local anesthesia was attained by infiltration with 1% lidocaine. A small dermatotomy was made. Under intermittent CT guidance, a 17 gauge introducer needle was advanced and positioned  at the margin of the enlarged lymph node. Multiple 18 gauge core biopsies were then coaxially obtained using the bio Pince automated biopsy device. Biopsy specimens were placed in saline and delivered to pathology for further evaluation. Post biopsy CT imaging demonstrates no evidence of immediate complication. IMPRESSION: Technically successful CT-guided core biopsy of left retroperitoneal lymphadenopathy. Electronically Signed   By: Jacqulynn Cadet M.D.   On: 07/30/2019 16:09   Dg Chest Portable 1 View  Result Date: 08/15/2019 CLINICAL DATA:  Weakness, cough, malaise and fever for 1-2 days. EXAM: PORTABLE CHEST 1 VIEW COMPARISON:  Single-view of the chest 08/08/2019 and 11/16/2016. CT chest 07/25/2019. FINDINGS: Port-A-Cath is in place, unchanged. Lungs are clear. No pneumothorax or pleural effusion. Heart size is normal. No acute or focal bony abnormality. IMPRESSION: No acute disease. Electronically Signed   By: Inge Rise M.D.   On: 08/15/2019 13:42   Dg Chest Port 1 View  Result Date: 08/08/2019 CLINICAL DATA:  S/p port a cath  placement EXAM: PORTABLE CHEST 1 VIEW COMPARISON:  Chest radiographs 11/16/2016, 06/09/2011  FINDINGS: Status post placement of a left chest Port-A-Cath with central venous catheter tip projecting over the SVC. Stable cardiomediastinal contours with normal heart size. The lungs are clear. No pneumothorax or large pleural effusion. No acute finding in the visualized skeleton. IMPRESSION: Status post left chest Port-A-Cath placement with central venous catheter tip projecting over the SVC. No pneumothorax. Electronically Signed   By: Audie Pinto M.D.   On: 08/08/2019 10:51   Dg Abd Acute 2+v W 1v Chest  Result Date: 07/24/2019 CLINICAL DATA:  Fall with hip pain EXAM: DG ABDOMEN ACUTE W/ 1V CHEST COMPARISON:  11/16/2016 chest x-ray FINDINGS: Single-view chest demonstrate surgical hardware in the lower cervical spine. No focal opacity or pleural effusion. Normal  heart size. No pneumothorax. Supine and upright views of the abdomen demonstrate no free air beneath the diaphragm. Nonobstructed gas pattern with moderate stool. No radiopaque calculi. Phleboliths in the pelvis IMPRESSION: Negative abdominal radiographs.  No acute cardiopulmonary disease. Electronically Signed   By: Donavan Foil M.D.   On: 07/24/2019 15:37   Dg Fluoro Guided Needle Plc Aspiration/injection Loc  Result Date: 08/14/2019 CLINICAL DATA:  High-grade B-cell lymphoma EXAM: DIAGNOSTIC LUMBAR PUNCTURE UNDER FLUOROSCOPIC GUIDANCE FLUOROSCOPY TIME:  Fluoroscopy Time:  0 minutes 12 seconds Radiation Exposure Index (if provided by the fluoroscopic device): 3.1 mGy Number of Acquired Spot Images: 1 digital fluoroscopic image capture PROCEDURE: Procedure, benefits, and risks were discussed with the patient, including alternatives. Patient's questions were answered. Written informed consent was obtained. Timeout protocol followed. Patient placed prone. L4-L5 disc space was localized under fluoroscopy. Skin prepped and draped in usual sterile fashion. Skin and soft tissues anesthetized with 2 mL of 1% lidocaine. 22 gauge needle was advanced into the spinal canal where clear colorless CSF was encountered with an opening pressure of 3 cm H2O (measured with patient prone). 11 mL of CSF was obtained in 4 tubes for requested analysis. Procedure tolerated very well by patient without immediate complication. IMPRESSION: Fluoroscopic guided lumbar puncture as above. Electronically Signed   By: Lavonia Dana M.D.   On: 08/14/2019 12:07   Dg C-arm 1-60 Min-no Report  Result Date: 08/08/2019 Fluoroscopy was utilized by the requesting physician.  No radiographic interpretation.   Dg Hip Unilat W Or Wo Pelvis 2-3 Views Left  Result Date: 07/24/2019 CLINICAL DATA:  Fall with left hip pain EXAM: DG HIP (WITH OR WITHOUT PELVIS) 2-3V LEFT COMPARISON:  11/16/2016, CT 06/09/2011 FINDINGS: SI joints are non widened. Pubic  symphysis and rami appear intact. No fracture or malalignment. Mild degenerative changes of the left hip with mild joint space narrowing and osteophytosis at the left femoral head neck junction. IMPRESSION: No acute osseous abnormality Electronically Signed   By: Donavan Foil M.D.   On: 07/24/2019 15:36     CBC Recent Labs  Lab 08/15/19 1205  WBC 2.7*  HGB 8.4*  HCT 27.6*  PLT 226  MCV 104.5*  MCH 31.8  MCHC 30.4  RDW 15.3  LYMPHSABS 1.0  MONOABS 1.1*  EOSABS 0.0  BASOSABS 0.0    Chemistries  Recent Labs  Lab 08/15/19 1205 08/16/19 0653  NA 134* 135  K 4.7 3.9  CL 102 103  CO2 24 20*  GLUCOSE 126* 107*  BUN 29* 21  CREATININE 1.59* 1.37*  CALCIUM 11.0* 10.1  AST 17 23  ALT 14 17  ALKPHOS 68 68  BILITOT 0.4 0.9   ------------------------------------------------------------------------------------------------------------------ No results for input(s): CHOL, HDL, LDLCALC, TRIG, CHOLHDL, LDLDIRECT in the last 72  hours.  Lab Results  Component Value Date   HGBA1C 5.6 07/24/2019   ------------------------------------------------------------------------------------------------------------------ No results for input(s): TSH, T4TOTAL, T3FREE, THYROIDAB in the last 72 hours.  Invalid input(s): FREET3 ------------------------------------------------------------------------------------------------------------------ No results for input(s): VITAMINB12, FOLATE, FERRITIN, TIBC, IRON, RETICCTPCT in the last 72 hours.  Coagulation profile No results for input(s): INR, PROTIME in the last 168 hours.  No results for input(s): DDIMER in the last 72 hours.  Cardiac Enzymes No results for input(s): CKMB, TROPONINI, MYOGLOBIN in the last 168 hours.  Invalid input(s): CK ------------------------------------------------------------------------------------------------------------------ No results found for: BNP   Roxan Hockey M.D on 08/16/2019 at 5:04 PM  Go to  [email or web address] - for contact info  [facility] - Office  [phone]

## 2019-08-17 LAB — RENAL FUNCTION PANEL
Albumin: 2.5 g/dL — ABNORMAL LOW (ref 3.5–5.0)
Anion gap: 13 (ref 5–15)
BUN: 16 mg/dL (ref 8–23)
CO2: 19 mmol/L — ABNORMAL LOW (ref 22–32)
Calcium: 10.4 mg/dL — ABNORMAL HIGH (ref 8.9–10.3)
Chloride: 105 mmol/L (ref 98–111)
Creatinine, Ser: 1.31 mg/dL — ABNORMAL HIGH (ref 0.61–1.24)
GFR calc Af Amer: >60 mL/min (ref >60)
GFR calc non Af Amer: 53 mL/min — ABNORMAL LOW (ref >60)
Glucose, Bld: 114 mg/dL — ABNORMAL HIGH (ref 70–99)
Phosphorus: 2.5 mg/dL (ref 2.5–4.6)
Potassium: 3.6 mmol/L (ref 3.5–5.1)
Sodium: 137 mmol/L (ref 135–145)

## 2019-08-17 LAB — GLUCOSE, CAPILLARY
Glucose-Capillary: 108 mg/dL — ABNORMAL HIGH (ref 70–99)
Glucose-Capillary: 123 mg/dL — ABNORMAL HIGH (ref 70–99)

## 2019-08-17 MED ORDER — POLYETHYLENE GLYCOL 3350 17 G PO PACK: 17.0000 g | PACK | Freq: Every day | ORAL | 0 refills | Status: DC | PRN

## 2019-08-17 MED ORDER — SODIUM CHLORIDE 0.9 % IV SOLN
90.0000 mg | Freq: Once | INTRAVENOUS | Status: DC
  Filled 2019-08-17: qty 10

## 2019-08-17 MED ORDER — ONDANSETRON HCL 4 MG PO TABS: 4.0000 mg | ORAL_TABLET | Freq: Four times a day (QID) | ORAL | 0 refills | Status: DC | PRN

## 2019-08-17 MED ORDER — ACETAMINOPHEN 325 MG PO TABS: 650.0000 mg | ORAL_TABLET | Freq: Four times a day (QID) | ORAL | 1 refills | Status: DC | PRN

## 2019-08-17 NOTE — Plan of Care (Signed)
  Problem: Acute Rehab PT Goals(only PT should resolve) Goal: Pt Will Go Supine/Side To Sit Outcome: Progressing Flowsheets (Taken 08/17/2019 0937) Pt will go Supine/Side to Sit: with modified independence Goal: Pt Will Go Sit To Supine/Side Outcome: Progressing Flowsheets (Taken 08/17/2019 0937) Pt will go Sit to Supine/Side: with modified independence Goal: Patient Will Transfer Sit To/From Stand Outcome: Progressing Flowsheets (Taken 08/17/2019 0937) Patient will transfer sit to/from stand: with supervision Goal: Pt Will Transfer Bed To Chair/Chair To Bed Outcome: Progressing Flowsheets (Taken 08/17/2019 0937) Pt will Transfer Bed to Chair/Chair to Bed: with supervision Goal: Pt Will Ambulate Outcome: Progressing Flowsheets (Taken 08/17/2019 0937) Pt will Ambulate:  25 feet  with minimal assist  with rolling walker  Tori Jaloni Davoli PT, DPT 08/17/19, 9:38 AM [phone]

## 2019-08-17 NOTE — Discharge Summary (Signed)
Daniel Richards, is a 73 y.o. male  DOB 1946/05/21  MRN [id number].  Admission date:  08/15/2019  Admitting Physician  Truett Mainland, DO  Discharge Date:  08/17/2019   Primary MD  [name], FNP  Recommendations for primary care physician for things to follow:   - 1) you have declined transfer to skilled nursing facility for rehab at this time--you have home health physical therapy and nurse come in to help you rehab and recover at home 2) please keep your appointment with Dr. Delton Coombes your oncologist for Wednesday, 08/20/2019 3)-please take your medications as prescribed 4) you will need repeat renal panel test including calcium during ER visit with Dr. Delton Coombes on Wednesday, 08/20/2019 5) please stop amlodipine/Norvasc for now as  blood pressure is somewhat soft, continue propanolol 6) please stop Invokana for now as blood sugars are borderline, okay to continue metformin.... 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, unspecified B-cell lymphoma type    BPH without urinary obstruction   Diabetic peripheral neuropathy ([place])   Hypertension associated with type 2 diabetes mellitus ([place])     Past Medical History:  Diagnosis Date   Anxiety and depression    Diabetes mellitus 06/09/2011   pt taking metformin   DJD of shoulder    right    Hyperlipidemia    Hypertension    Migraines    Port-A-Cath in place 08/13/2019   Tremor    Vitamin D deficiency     Past Surgical History:  Procedure Laterality Date   BACK SURGERY     KNEE SURGERY      PORTACATH PLACEMENT Left 08/08/2019   Procedure: INSERTION PORT-A-CATH;  Surgeon: Aviva Signs, MD;  Location: [facility];  Service: General;  Laterality: Left;   SHOULDER SURGERY       HPI  from the history and physical done on the day of admission:  - Patient Coming From: home  Chief Complaint: confusion, weakness  HPI: Daniel Richards is a 73 y.o. male with a history of diabetes, hypertension, recent diagnosis of B-cell lymphoma with chemotherapy to start next week.  Patient presents with weakness, chills, confusion that started earlier this morning.  He was brought to the emergency department for evaluation.  His symptoms are slightly worsened than before.  No palliating or provoking factors.  He was admitted in September due to similar complaints of confusion and found to be hypercalcemic.  Emergency Department Course: White count 2.7.  Creatinine approximately baseline.  Calcium 11.0, corrected to 12  Review of Systems:   Pt complains of confusion.  Pt denies any fevers, nausea, vomiting, diarrhea, constipation, abdominal pain, shortness of breath, dyspnea on exertion, orthopnea, cough, wheezing, palpitations, headache, vision changes,  lightheadedness, dizziness, melena, rectal bleeding.  Review of systems are otherwise negative    Hospital Course:    - Brief Summary:- 73 y.o.malewith a history of diabetes, hypertension, recent diagnosis of B-cell lymphoma with chemotherapy to start next week.   Admitted on 08/15/2019 with weakness, fevers, chills and confusion    A/P 1)Hypercalcemia-suspect malignancy related, responded well to IV fluids,   -Corrected calcium now 11.3,  Hold off on Zometa, pamidronate or calcitonin at this time -No further confusion, follow-up with Dr. Delton Coombes as scheduled on Wednesday, 08/20/2019 for possible initiation of chemotherapy -Renal function panel including calcium levels should be checked at that visit  2) B-cell lymphoma--- follow-up  with Dr. Delton Coombes on 08/20/2019  as previously planned for initiation of chemotherapy and to review results of recent lumbar puncture and CSF studies  3)BPH with LUTs--- continue indwelling Foley catheter, -urine does not look infected -Continue Flomax  4) fevers/chills/weakness--- fevers have resolved, chest x-ray without acute findings, UA does not look infected, blood cultures are NGTD  Hold off on antibiotics -Port-A-Cath site looks clean dry and intact  5) acute metabolic encephalopathy--confusional episodes have resolved, suspect it was due to combination of fevers and hypercalcemia--please see #1 and #4 above  6)DM-excellent control, A1c 5.6, okay to restart metformin, stop Invokana  7) depressive disorder--stable, continue Celexa, as well as Restoril as needed for sleep  8)HTN-okay to continue propranolol, but stop amlodipine   9) generalized weakness/debility--physical therapy evaluation appreciated recommended SNF rehab -Patient, his daughter and son all declined SNF rehab at this time they are requesting discharge home with home health services.   -PTA patient was already getting home health physical therapy and social work services  Disposition-Home  Code Status : Full  Family Communication:   (patient is alert, awake and coherent) -Discussed with patient's son at bedside, questions answered Called and d/w Pt's daughter (POA) Tammy Administrator, sports  Disposition Plan  : -Home with home health as patient and family have declined SNF rehab  Discharge Condition: stable medically, but physically weak  Follow UP-- Dr Delton Coombes on 08/20/2019  Diet and Activity recommendation:  As advised  Discharge Instructions    Discharge Instructions    Call MD for:  difficulty breathing, headache or visual disturbances   Complete by: As directed    Call MD for:  persistant dizziness or light-headedness   Complete by: As directed    Call MD for:  persistant nausea and vomiting    Complete by: As directed    Call MD for:  severe uncontrolled pain   Complete by: As directed    Call MD for:  temperature >100.4   Complete by: As directed    Diet - low sodium heart healthy   Complete by: As directed    Discharge instructions   Complete by: As directed    1) you have declined transfer to skilled nursing facility for rehab at this time--you have home health physical therapy and nurse come in to help you rehab and recover at home 2) please keep your appointment with Dr. Delton Coombes your oncologist for Wednesday, 08/20/2019 3)-please take your medications as prescribed 4) you will need repeat renal panel test including calcium during ER visit with Dr. Delton Coombes on Wednesday, 08/20/2019 5) please stop amlodipine/Norvasc for now as her blood pressure is somewhat soft, continue propanolol 6) please stop Invokana for now as blood sugars are borderline, okay to continue metformin.... You might need Invokana again if you do go on steroids around your chemotherapy treatment  period   Increase activity slowly   Complete by: As directed         Discharge Medications     Allergies as of 08/17/2019      Reactions   Vancomycin Hives   Patient received Benadryl to treat the hives      Medication List    STOP taking these medications   amLODipine 5 MG tablet Commonly known as: NORVASC   Invokana 100 MG Tabs tablet Generic drug: canagliflozin     TAKE these medications   acetaminophen 325 MG tablet Commonly known as: TYLENOL Take 2 tablets (650 mg total) by mouth every 6 (six) hours as needed for mild pain, fever or headache.   allopurinol 300 MG tablet Commonly known as: ZYLOPRIM Take 1 tablet (300 mg total) by mouth daily.   calcitRIOL 0.5 MCG capsule Commonly known as: ROCALTROL Take 1 capsule (0.5 mcg total) by mouth daily.   citalopram 20 MG tablet Commonly known as: CELEXA Take 1 tablet (20 mg total) by mouth at bedtime.   cyanocobalamin 1000 MCG  tablet Take 1 tablet (1,000 mcg total) by mouth daily.   CYCLOPHOSPHAMIDE IV Inject into the vein every 21 ( twenty-one) days. Start taking on: August 20, 2019   DOXORUBICIN HCL IV Inject into the vein every 21 ( twenty-one) days. Start taking on: October 14, [id number]   folic acid 1 MG tablet Commonly known as: FOLVITE Take 1 tablet (1 mg total) by mouth daily.   gabapentin 300 MG capsule Commonly known as: NEURONTIN Take 2 capsules (600 mg total) by mouth 3 (three) times daily.   lidocaine-prilocaine cream Commonly known as: EMLA Apply a small amount to port a cath site and cover with plastic wrap 1 hour prior to chemotherapy appointments   metFORMIN 500 MG tablet Commonly known as: GLUCOPHAGE Take 1 tablet (500 mg total) by mouth daily.   NON FORMULARY Diet: Regular, NAS, Consistent Carbohydrate   ondansetron 4 MG tablet Commonly known as: ZOFRAN Take 1 tablet (4 mg total) by mouth every 6 (six) hours as needed for nausea.   pantoprazole 40 MG tablet Commonly known as: PROTONIX Take 1 tablet (40 mg total) by mouth 2 (two) times daily before a meal.   polyethylene glycol 17 g packet Commonly known as: MIRALAX / GLYCOLAX Take 17 g by mouth daily as needed for mild constipation.   predniSONE 20 MG tablet Commonly known as: DELTASONE Take 3 tablets (60 mg total) by mouth daily. Take on days 1-5 of chemotherapy.   primidone 50 MG tablet Commonly known as: MYSOLINE Take 1.5 tablets (75 mg total) by mouth every 8 (eight) hours.   prochlorperazine 10 MG tablet Commonly known as: COMPAZINE Take 1 tablet (10 mg total) by mouth every 6 (six) hours as needed (Nausea or vomiting).   propranolol 40 MG tablet Commonly known as: INDERAL Take 1 tablet (40 mg total) by mouth 2 (two) times daily.   riTUXimab in sodium chloride 0.9 % 250 mL Inject into the vein every 21 ( twenty-one) days. Start taking on: August 20, 2019   sucralfate 1 GM/10ML suspension Commonly known as:  CARAFATE Take 10 mLs (1 g total) by mouth 4 (four) times daily -  with meals and at bedtime.   tamsulosin 0.4 MG Caps capsule Commonly known as: FLOMAX Take 1 capsule (0.4 mg total) by mouth daily. Give 30 minutes after same meal daily. Do not open/crush/chew   temazepam 15 MG capsule Commonly known as: RESTORIL Take 1 capsule (  15 mg total) by mouth at bedtime as needed for sleep.   topiramate 100 MG tablet Commonly known as: TOPAMAX Take 2 tablets (200 mg total) by mouth at bedtime.   vinCRIStine 2 mg in sodium chloride 0.9 % 50 mL Inject 2 mg into the vein every 21 ( twenty-one) days. Start taking on: August 20, 2019       Major procedures and Radiology Reports - PLEASE review detailed and final reports for all details, in brief -    Ct Head Wo Contrast  Result Date: 08/15/2019 CLINICAL DATA:  Low grade fever. Body aches, malaise and lethargy for the past day. EXAM: CT HEAD WITHOUT CONTRAST TECHNIQUE: Contiguous axial images were obtained from the base of the skull through the vertex without intravenous contrast. COMPARISON:  Head CT scan 07/24/2019. FINDINGS: Brain: No evidence of acute infarction, hemorrhage, hydrocephalus, extra-axial collection or mass lesion/mass effect. Mild atrophy and chronic microvascular ischemic change noted. Vascular: No hyperdense vessel or unexpected calcification. Skull: Intact.  No focal lesion. Sinuses/Orbits: Negative. Other: None. IMPRESSION: No acute abnormality. Mild atrophy and chronic microvascular ischemic change. Electronically Signed   By: Inge Rise M.D.   On: 08/15/2019 16:07   Ct Head Wo Contrast  Result Date: 07/24/2019 CLINICAL DATA:  Trauma. EXAM: CT HEAD WITHOUT CONTRAST CT CERVICAL SPINE WITHOUT CONTRAST TECHNIQUE: Multidetector CT imaging of the head and cervical spine was performed following the standard protocol without intravenous contrast. Multiplanar CT image reconstructions of the cervical spine were also generated.  COMPARISON:  CT scan of November 16, 2016. FINDINGS: CT HEAD FINDINGS Brain: Mild chronic ischemic white matter disease is noted. No mass effect or midline shift is noted. Ventricular size is within normal limits. There is no evidence of mass lesion, hemorrhage or acute infarction. Vascular: No hyperdense vessel or unexpected calcification. Skull: Normal. Negative for fracture or focal lesion. Sinuses/Orbits: Right frontal and ethmoid mucosal thickening is noted. Other: None. CT CERVICAL SPINE FINDINGS Alignment: Normal. Skull base and vertebrae: No acute fracture. No primary bone lesion or focal pathologic process. Soft tissues and spinal canal: No prevertebral fluid or swelling. No visible canal hematoma. Disc levels: Status post surgical anterior fusion of C5-6. Mild anterior osteophyte formation is also noted at C4-5 and C6-7. Upper chest: Negative. Other: None. IMPRESSION: Mild chronic ischemic white matter disease. No acute intracranial abnormality seen. Postsurgical and degenerative changes are noted in the cervical spine. No fracture or significant spondylolisthesis is noted. Electronically Signed   By: Marijo Conception M.D.   On: 07/24/2019 16:20   Ct Chest W Contrast  Result Date: 07/25/2019 CLINICAL DATA:  Abdominal lymphadenopathy, evaluate chest, history of recent falls and weakness EXAM: CT CHEST WITH CONTRAST TECHNIQUE: Multidetector CT imaging of the chest was performed during intravenous contrast administration. CONTRAST:  1mL OMNIPAQUE IOHEXOL 300 MG/ML  SOLN COMPARISON:  CT abdomen pelvis, 07/24/2019, CT chest, 09/20/2009 FINDINGS: Cardiovascular: Aortic atherosclerosis. Normal heart size. Extensive 3 vessel coronary artery calcifications. No pericardial effusion. Mediastinum/Nodes: There is an enlarged paraesophageal lymph node measuring 2.0 x 1.4 cm at the level of T6-T7 (series 2, image 71). There are prominent periaortic and retrocrural nodes lower in the thorax (series 2, image 135, 153).  There are enlarged left lower cervical/superior mediastinal lymph nodes measuring up to 1.2 x 1.0 cm (series 2, image 7). No other enlarged mediastinal, hilar, or axillary lymph nodes. Thyroid gland, trachea, and esophagus demonstrate no significant findings. Lungs/Pleura: Lungs are clear. No pleural effusion or pneumothorax. Upper Abdomen: No acute  abnormality. Bulky retroperitoneal lymphadenopathy, better assessed on prior CT of the abdomen pelvis. Musculoskeletal: No chest wall mass or suspicious bone lesions identified. IMPRESSION: 1. There is an enlarged paraesophageal lymph node measuring 2.0 x 1.4 cm at the level of T6-T7 (series 2, image 71). There are prominent periaortic and retrocrural nodes lower in the thorax (series 2, image 135, 153). 2. There are enlarged left lower cervical/superior mediastinal lymph nodes measuring up to 1.2 x 1.0 cm (series 2, image 7). 3.  Coronary artery disease.  Aortic Atherosclerosis (ICD10-I70.0). Electronically Signed   By: Eddie Candle M.D.   On: 07/25/2019 14:51   Ct Cervical Spine Wo Contrast  Result Date: 07/24/2019 CLINICAL DATA:  Trauma. EXAM: CT HEAD WITHOUT CONTRAST CT CERVICAL SPINE WITHOUT CONTRAST TECHNIQUE: Multidetector CT imaging of the head and cervical spine was performed following the standard protocol without intravenous contrast. Multiplanar CT image reconstructions of the cervical spine were also generated. COMPARISON:  CT scan of November 16, 2016. FINDINGS: CT HEAD FINDINGS Brain: Mild chronic ischemic white matter disease is noted. No mass effect or midline shift is noted. Ventricular size is within normal limits. There is no evidence of mass lesion, hemorrhage or acute infarction. Vascular: No hyperdense vessel or unexpected calcification. Skull: Normal. Negative for fracture or focal lesion. Sinuses/Orbits: Right frontal and ethmoid mucosal thickening is noted. Other: None. CT CERVICAL SPINE FINDINGS Alignment: Normal. Skull base and vertebrae:  No acute fracture. No primary bone lesion or focal pathologic process. Soft tissues and spinal canal: No prevertebral fluid or swelling. No visible canal hematoma. Disc levels: Status post surgical anterior fusion of C5-6. Mild anterior osteophyte formation is also noted at C4-5 and C6-7. Upper chest: Negative. Other: None. IMPRESSION: Mild chronic ischemic white matter disease. No acute intracranial abnormality seen. Postsurgical and degenerative changes are noted in the cervical spine. No fracture or significant spondylolisthesis is noted. Electronically Signed   By: Marijo Conception M.D.   On: 07/24/2019 16:20   Mr Thoracic Spine Wo Contrast  Result Date: 07/30/2019 CLINICAL DATA:  Suspected lymphoma.  Back pain and leg weakness. EXAM: MRI THORACIC SPINE WITHOUT CONTRAST TECHNIQUE: Multiplanar, multisequence MR imaging of the thoracic spine was performed. No intravenous contrast was administered. COMPARISON:  MRI 11/17/2016 FINDINGS: Alignment:  Normal Vertebrae: Normal.  No fracture or marrow space abnormality. Cord:  Normal.  No cord compression or primary cord lesion. Paraspinal and other soft tissues: Paraesophageal node as shown by CT. Tiny amount of pleural fluid. Disc levels: No significant disc disease in the thoracic region. No disc herniation or stenosis of the canal or foramina. No significant facet arthropathy. IMPRESSION: No significant thoracic spinal abnormality. No significant degenerative disease, stenosis or nerve compression. No evidence of fracture or bone marrow lesion. Paraesophageal lymph node as shown by CT. Electronically Signed   By: Nelson Chimes M.D.   On: 07/30/2019 15:15   Mr Lumbar Spine Wo Contrast  Result Date: 07/30/2019 CLINICAL DATA:  Generalized weakness and back pain. Frequent falling. EXAM: MRI LUMBAR SPINE WITHOUT CONTRAST TECHNIQUE: Multiplanar, multisequence MR imaging of the lumbar spine was performed. No intravenous contrast was administered. COMPARISON:  CT  07/24/2019.  MRI 11/17/2016 FINDINGS: Segmentation:  5 lumbar type vertebral bodies. Alignment:  Normal Vertebrae:  No fracture or primary bone lesion. Conus medullaris and cauda equina: Conus extends to the L1 level. Conus and cauda equina appear normal. Paraspinal and other soft tissues: Massive retroperitoneal lymphadenopathy as seen on the recent CT. Not present in January of 2018.  Disc levels: No significant finding at T12-L1 or L1-2. L2-3: Mild desiccation and bulging of the disc. No compressive stenosis. L3-4: Normal interspace. L4-5: Mild bulging of the disc. No compressive stenosis. Mild facet osteoarthritis. L5-S1: Previous right hemilaminectomy. Right posterolateral disc herniation/scarring with narrowing of the right lateral recess that could compress the right S1 nerve. Similar appearance to the study of 2018. IMPRESSION: Massive retroperitoneal lymphadenopathy. No evidence of marrow space tumor or fracture. Previous right hemilaminectomy at L5-S1. Right lateral recess stenosis because of protruding disc material and scarring could possibly affect the right S1 nerve. Appearance is unchanged since 2018. Electronically Signed   By: Nelson Chimes M.D.   On: 07/30/2019 15:12   Ct Abdomen Pelvis W Contrast  Result Date: 07/24/2019 CLINICAL DATA:  Bilateral leg weakness. Patient fell in the bathtub today. EXAM: CT ABDOMEN AND PELVIS WITH CONTRAST TECHNIQUE: Multidetector CT imaging of the abdomen and pelvis was performed using the standard protocol following bolus administration of intravenous contrast. CONTRAST:  65mL OMNIPAQUE IOHEXOL 300 MG/ML SOLN, 5mL OMNIPAQUE IOHEXOL 300 MG/ML SOLN COMPARISON:  06/09/2011 FINDINGS: Lower chest: Minor linear atelectasis or scarring in the anterior lung bases. Lung bases otherwise clear. Heart normal in size. Hepatobiliary: No focal liver abnormality is seen. No gallstones, gallbladder wall thickening, or biliary dilatation. Pancreas: Unremarkable. No pancreatic  ductal dilatation or surrounding inflammatory changes. Spleen: Normal in size without focal abnormality. Adrenals/Urinary Tract: No adrenal masses. Kidneys are normal in position. There is bilateral renal cortical thinning. 18 mm low-attenuation mass, posterior midpole the left kidney, consistent with a cyst. No other renal masses. Small nonobstructing stones in both kidneys. No hydronephrosis. Ureters normal in course and in caliber. No ureteral stones. Bladder moderately distended. There are dependent stones with some mild increased attenuation in the dependent bladder is well, likely debris. No wall thickening or convincing mass. Stomach/Bowel: Stomach is unremarkable. Small bowel and colon are normal in caliber. No wall thickening. No inflammation. Mild increased colonic stool burden. No evidence of appendicitis. Appendix not visualized. Vascular/Lymphatic: There is extensive adenopathy. Bulky retroperitoneal adenopathy is noted extending from level of the adrenal glands into the pelvis. Node at the level of the midpole the left kidney, left periaortic, measures 4.3 cm in short axis. Left periaortic node at the level of the bifurcation measures 4.3 cm short axis. There are bulky left pelvic lymph nodes. Posteroinferior pelvic node, to the left of the lower sigmoid colon, measures 7.4 x 4.6 cm transversely. Adjacent left external iliac chain node measures 7.2 x 3.5 cm adenopathy surrounds the common iliac veins, greater on the left, as well as the left external iliac vein. It is not defined and may be occluded. No mesenteric adenopathy. No enlarged inguinal lymph nodes. Dense aortic atherosclerosis.  No aneurysm. Reproductive: Prostate is enlarged measuring 5.9 x 5.5 x 5.6 cm. Other: No abdominal wall hernia or abnormality. No abdominopelvic ascites. Musculoskeletal: No fracture or acute finding. No osteoblastic or osteolytic lesions. IMPRESSION: 1. There is bulky retroperitoneal and left pelvic lymphadenopathy  that has developed since the prior CT. Adenopathy may occlude the left common and/or external iliac vein. Differential diagnosis includes lymphoma and other lymphoproliferative disorders. Consider the possibility of prostate carcinoma metastatic to lymph nodes. 2. No acute findings within the abdomen or pelvis. 3. Bilateral intrarenal stones. Dependent bladder stones as well as a small amount of dependent bladder debris. 4. Bilateral renal cortical thinning. 5. Aortic atherosclerosis. Electronically Signed   By: Lajean Manes M.D.   On: 07/24/2019 20:21  US Renal  Result Date: 07/25/2019 CLINICAL DATA:  Acute kidney injury. EXAM: RENAL / URINARY TRACT ULTRASOUND COMPLETE COMPARISON:  CT abdomen and pelvis 07/24/2019. FINDINGS: Right Kidney: Renal measurements: 10.4 x 5.1 x 4.9 cm = volume: 138 mL . Echogenicity within normal limits. No mass or hydronephrosis visualized. Cortex is thinned. Punctate nonobstructing stone in the lower pole seen on prior CT noted. Left Kidney: Renal measurements: 11.2 x 5.9 x 5.1 cm = volume: 180 mL. Echogenicity within normal limits. No solid mass or hydronephrosis visualized. Cortex is mildly thinned. Simple 2.1 cm cyst noted. Small nonobstructing stones are seen as on prior CT. Bladder: Bladder calculi are identified as seen on prior CT. The bladder is distended. IMPRESSION: No acute abnormality.  Negative for hydronephrosis. Bilateral nonobstructing renal stones, more numerous on the left. Distended urinary bladder. Electronically Signed   By: Inge Rise M.D.   On: 07/25/2019 14:48   Nm Pet Image Initial (pi) Skull Base To Thigh  Result Date: 08/07/2019 CLINICAL DATA:  Initial treatment strategy for staging of lymphoma. EXAM: NUCLEAR MEDICINE PET SKULL BASE TO THIGH TECHNIQUE: 9.4 mCi F-18 FDG was injected intravenously. Full-ring PET imaging was performed from the skull base to thigh after the radiotracer. CT data was obtained and used for attenuation correction and  anatomic localization. Fasting blood glucose: 143 mg/dl COMPARISON:  Chest CT 07/25/2019.  Abdominopelvic CT 07/24/2019. FINDINGS: Mediastinal blood pool activity: SUV max 2.3 Liver activity: SUV max 4.0 NECK: Right ethmoid air cell likely expansile soft tissue density corresponding to hypermetabolism. Example 2.3 x 1.5 cm and a S.U.V. max of 9.3 on 08/04. Low left jugular/supraclavicular nodes at up to 7 mm and a S.U.V. max of 5.9 on 41/4. Incidental CT findings: Bilateral carotid atherosclerosis. CHEST: Thoracic nodal hypermetabolism. Example low subcarinal/periesophageal node of 1.1 cm and a S.U.V. max of 11.6 on 77/4. Retrocrural node measures 8 mm and a S.U.V. max of 8.1 on image 107/4. Incidental CT findings: Mild cardiomegaly. Multivessel coronary artery atherosclerosis. Aortic atherosclerosis. Emphysema. ABDOMEN/PELVIS: Bulky abdominal hypermetabolic adenopathy. An index nodal mass in the low left periaortic station measures 4.1 x 4.4 cm and a S.U.V. max of 16.1 on image 146/4. Massive left pelvic nodal conglomerate measures on the order of 13.9 x 5.7 cm and a S.U.V. max of 14.7 on 180/4. Incidental CT findings: Abdominal aortic atherosclerosis. Punctate bilateral renal collecting system calculi. Colonic stool burden suggests constipation. Foley catheter within the urinary bladder. The bladder is decompressed, but appears thick walled. Bladder stones and surrounding pericystic edema. Mild prostatomegaly. SKELETON: Heterogeneous foci of marrow hypermetabolism. Example within the sternum at a S.U.V. max of 9.3. Incidental CT findings: Cervical spine fixation. IMPRESSION: 1. 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 Heterogeneous foci of marrow hypermetabolism, consistent with lymphomatous involvement. 2. Right sphenoid sinus soft tissue density and hypermetabolism. Especially given suggestion of expansion in this area, favored to represent lymphomatous involvement.  Sinusitis could look similar. 3. Aortic atherosclerosis (ICD10-I70.0), coronary artery atherosclerosis and emphysema (ICD10-J43.9). 4. Prostatomegaly. Findings of bladder outlet obstruction, including pericystic edema, wall thickening, and bladder stones. Cystitis cannot be excluded. 5. Bilateral nephrolithiasis. Electronically Signed   By: Abigail Miyamoto M.D.   On: 08/07/2019 16:30   Ct Biopsy  Result Date: 07/30/2019 INDICATION: 73 year old male with newly diagnosed extensive pelvic and retroperitoneal lymphadenopathy concerning for lymphoma versus metastatic pancreatic cancer. EXAM: CT-guided biopsy retroperitoneal lymph node MEDICATIONS: None. ANESTHESIA/SEDATION: Moderate (conscious) sedation was employed during this procedure. A total of  Versed 1 mg and Fentanyl 50 mcg was administered intravenously. Moderate Sedation Time: 10 minutes. The patient's level of consciousness and vital signs were monitored continuously by radiology nursing throughout the procedure under my direct supervision. FLUOROSCOPY TIME:  None COMPLICATIONS: None immediate. PROCEDURE: Informed written consent was obtained from the patient after a thorough discussion of the procedural risks, benefits and alternatives. All questions were addressed. Maximal Sterile Barrier Technique was utilized including caps, mask, sterile gowns, sterile gloves, sterile drape, hand hygiene and skin antiseptic. A timeout was performed prior to the initiation of the procedure. A planning axial CT scan was performed. A suitable left retroperitoneal lymph node was identified. A suitable skin entry site was selected and marked. After sterile prep and drape in the standard fashion with chlorhexidine skin prep, local anesthesia was attained by infiltration with 1% lidocaine. A small dermatotomy was made. Under intermittent CT guidance, a 17 gauge introducer needle was advanced and positioned at the margin of the enlarged lymph node. Multiple 18 gauge core biopsies  were then coaxially obtained using the bio Pince automated biopsy device. Biopsy specimens were placed in saline and delivered to pathology for further evaluation. Post biopsy CT imaging demonstrates no evidence of immediate complication. IMPRESSION: Technically successful CT-guided core biopsy of left retroperitoneal lymphadenopathy. Electronically Signed   By: Jacqulynn Cadet M.D.   On: 07/30/2019 16:09   Dg Chest Portable 1 View  Result Date: 08/15/2019 CLINICAL DATA:  Weakness, cough, malaise and fever for 1-2 days. EXAM: PORTABLE CHEST 1 VIEW COMPARISON:  Single-view of the chest 08/08/2019 and 11/16/2016. CT chest 07/25/2019. FINDINGS: Port-A-Cath is in place, unchanged. Lungs are clear. No pneumothorax or pleural effusion. Heart size is normal. No acute or focal bony abnormality. IMPRESSION: No acute disease. Electronically Signed   By: Inge Rise M.D.   On: 08/15/2019 13:42   Dg Chest Port 1 View  Result Date: 08/08/2019 CLINICAL DATA:  S/p port a cath  placement EXAM: PORTABLE CHEST 1 VIEW COMPARISON:  Chest radiographs 11/16/2016, 06/09/2011 FINDINGS: Status post placement of a left chest Port-A-Cath with central venous catheter tip projecting over the SVC. Stable cardiomediastinal contours with normal heart size. The lungs are clear. No pneumothorax or large pleural effusion. No acute finding in the visualized skeleton. IMPRESSION: Status post left chest Port-A-Cath placement with central venous catheter tip projecting over the SVC. No pneumothorax. Electronically Signed   By: Audie Pinto M.D.   On: 08/08/2019 10:51   Dg Abd Acute 2+v W 1v Chest  Result Date: 07/24/2019 CLINICAL DATA:  Fall with hip pain EXAM: DG ABDOMEN ACUTE W/ 1V CHEST COMPARISON:  11/16/2016 chest x-ray FINDINGS: Single-view chest demonstrate surgical hardware in the lower cervical spine. No focal opacity or pleural effusion. Normal heart size. No pneumothorax. Supine and upright views of the abdomen  demonstrate no free air beneath the diaphragm. Nonobstructed gas pattern with moderate stool. No radiopaque calculi. Phleboliths in the pelvis IMPRESSION: Negative abdominal radiographs.  No acute cardiopulmonary disease. Electronically Signed   By: Donavan Foil M.D.   On: 07/24/2019 15:37   Dg Fluoro Guided Needle Plc Aspiration/injection Loc  Result Date: 08/14/2019 CLINICAL DATA:  High-grade B-cell lymphoma EXAM: DIAGNOSTIC LUMBAR PUNCTURE UNDER FLUOROSCOPIC GUIDANCE FLUOROSCOPY TIME:  Fluoroscopy Time:  0 minutes 12 seconds Radiation Exposure Index (if provided by the fluoroscopic device): 3.1 mGy Number of Acquired Spot Images: 1 digital fluoroscopic image capture PROCEDURE: Procedure, benefits, and risks were discussed with the patient, including alternatives. Patient's questions were answered. Written informed  consent was obtained. Timeout protocol followed. Patient placed prone. L4-L5 disc space was localized under fluoroscopy. Skin prepped and draped in usual sterile fashion. Skin and soft tissues anesthetized with 2 mL of 1% lidocaine. 22 gauge needle was advanced into the spinal canal where clear colorless CSF was encountered with an opening pressure of 3 cm H2O (measured with patient prone). 11 mL of CSF was obtained in 4 tubes for requested analysis. Procedure tolerated very well by patient without immediate complication. IMPRESSION: Fluoroscopic guided lumbar puncture as above. Electronically Signed   By: Lavonia Dana M.D.   On: 08/14/2019 12:07   Dg C-arm 1-60 Min-no Report  Result Date: 08/08/2019 Fluoroscopy was utilized by the requesting physician.  No radiographic interpretation.   Dg Hip Unilat W Or Wo Pelvis 2-3 Views Left  Result Date: 07/24/2019 CLINICAL DATA:  Fall with left hip pain EXAM: DG HIP (WITH OR WITHOUT PELVIS) 2-3V LEFT COMPARISON:  11/16/2016, CT 06/09/2011 FINDINGS: SI joints are non widened. Pubic symphysis and rami appear intact. No fracture or malalignment. Mild  degenerative changes of the left hip with mild joint space narrowing and osteophytosis at the left femoral head neck junction. IMPRESSION: No acute osseous abnormality Electronically Signed   By: Donavan Foil M.D.   On: 07/24/2019 15:36   Micro Results   Recent Results (from the past 240 hour(s))  SARS Coronavirus 2 by RT PCR (hospital order, performed in [facility] hospital lab) Nasopharyngeal Nasopharyngeal Swab     Status: None   Collection Time: 08/15/19 12:39 PM   Specimen: Nasopharyngeal Swab  Result Value Ref Range Status   SARS Coronavirus 2 NEGATIVE NEGATIVE Final    Comment: (NOTE) If result is NEGATIVE SARS-CoV-2 target nucleic acids are NOT DETECTED. The SARS-CoV-2 RNA is generally detectable in upper and lower  respiratory specimens during the acute phase of infection. The lowest  concentration of SARS-CoV-2 viral copies this assay can detect is 250  copies / mL. A negative result does not preclude SARS-CoV-2 infection  and should not be used as the sole basis for treatment or other  patient management decisions.  A negative result may occur with  improper specimen collection / handling, submission of specimen other  than nasopharyngeal swab, presence of viral mutation(s) within the  areas targeted by this assay, and inadequate number of viral copies  (<250 copies / mL). A negative result must be combined with clinical  observations, patient history, and epidemiological information. If result is POSITIVE SARS-CoV-2 target nucleic acids are DETECTED. The SARS-CoV-2 RNA is generally detectable in upper and lower  respiratory specimens dur ing the acute phase of infection.  Positive  results are indicative of active infection with SARS-CoV-2.  Clinical  correlation with patient history and other diagnostic information is  necessary to determine patient infection status.  Positive results do  not rule out bacterial infection or co-infection with other viruses. If result is  PRESUMPTIVE POSTIVE SARS-CoV-2 nucleic acids MAY BE PRESENT.   A presumptive positive result was obtained on the submitted specimen  and confirmed on repeat testing.  While 2019 novel coronavirus  (SARS-CoV-2) nucleic acids may be present in the submitted sample  additional confirmatory testing may be necessary for epidemiological  and / or clinical management purposes  to differentiate between  SARS-CoV-2 and other Sarbecovirus currently known to infect humans.  If clinically indicated additional testing with an alternate test  methodology [phone]) is advised. The SARS-CoV-2 RNA is generally  detectable in upper and lower respiratory  sp ecimens during the acute  phase of infection. The expected result is Negative. Fact Sheet for Patients:  StrictlyIdeas.no Fact Sheet for Healthcare Providers: BankingDealers.co.za This test is not yet approved or cleared by the [place] FDA and has been authorized for detection and/or diagnosis of SARS-CoV-2 by FDA under an Emergency Use Authorization (EUA).  This EUA will remain in effect (meaning this test can be used) for the duration of the COVID-19 declaration under Section 564(b)(1) of the Act, 21 U.S.C. section 360bbb-3(b)(1), unless the authorization is terminated or revoked sooner. Performed at [facility], [address] [place], [place] [address]   Culture, blood (Routine X 2) w Reflex to ID Panel     Status: None (Preliminary result)   Collection Time: 08/16/19  9:13 AM   Specimen: Left Antecubital; Blood  Result Value Ref Range Status   Specimen Description   Final    LEFT ANTECUBITAL BOTTLES DRAWN AEROBIC AND ANAEROBIC   Special Requests   Final    Blood Culture adequate volume Performed at [facility], [address], [place], [place] [address]    Culture PENDING  Incomplete   Report Status PENDING  Incomplete  Culture, blood (Routine X 2) w Reflex to ID Panel     Status: None  (Preliminary result)   Collection Time: 08/16/19  9:14 AM   Specimen: BLOOD LEFT FOREARM  Result Value Ref Range Status   Specimen Description   Final    BLOOD LEFT FOREARM BOTTLES DRAWN AEROBIC AND ANAEROBIC   Special Requests   Final    Blood Culture adequate volume Performed at [facility], [address], [place], [place] [address]    Culture PENDING  Incomplete   Report Status PENDING  Incomplete    Today   Subjective    Daniel Richards today has no new concerns, eating and drinking okay  -[place] declines SNF rehab  Patient has been seen and examined prior to discharge   Objective   Blood pressure (!) 126/55, pulse 85, temperature 98.3 F (36.8 C), temperature source Oral, resp. rate 18, height 5\' 9"  (1.753 m), weight 85.9 kg, SpO2 96 %.   Intake/Output Summary (Last 24 hours) at 08/17/2019 1359 Last data filed at 08/17/2019 1342 Gross per 24 hour  Intake 2715.86 ml  Output 2250 ml  Net 465.86 ml    Exam Gen:- Awake Alert,  In no apparent distress  HEENT:- [place].AT, No sclera icterus Neck-Supple Neck,No JVD,.  Lungs-  CTAB , fair symmetrical air movement,  CV- S1, S2 normal, regular, Port-A-Cath in situ Abd-  +ve B.Sounds, Abd Soft, No tenderness,    Extremity/Skin:- No  edema, pedal pulses present  Psych-affect is appropriate, oriented x3 Neuro-generalized weakness no new focal deficits, no tremors GU-Foley with clear urine   Data Review   CBC w Diff:  Lab Results  Component Value Date   WBC 2.7 (L) 08/15/2019   HGB 8.4 (L) 08/15/2019   HCT 27.6 (L) 08/15/2019   PLT 226 08/15/2019   LYMPHOPCT 36 08/15/2019   MONOPCT 41 08/15/2019   EOSPCT 2 08/15/2019   BASOPCT 1 08/15/2019    CMP:  Lab Results  Component Value Date   NA 137 08/17/2019   NA 141 09/01/2015   K 3.6 08/17/2019   CL 105 08/17/2019   CO2 19 (L) 08/17/2019   BUN 16 08/17/2019   BUN 16 09/01/2015   CREATININE 1.31 (H) 08/17/2019   PROT 6.7 08/16/2019   ALBUMIN 2.5 (L) 08/17/2019  BILITOT 0.9 08/16/2019   ALKPHOS 68 08/16/2019   AST 23 08/16/2019   ALT 17 08/16/2019  .   Total Discharge time is about 33 minutes  Roxan Hockey M.D on 08/17/2019 at 1:59 PM  Go to [email or web address] -  for contact info  [facility] - Office  [phone]

## 2019-08-17 NOTE — Progress Notes (Signed)
Nsg Discharge Note  Admit Date:  08/15/2019 Discharge date: 08/17/2019   MACHAI LAW to be D/C'd Home per MD order.  AVS completed.  Copy for chart, and copy for patient signed, and dated. Removed IV-clean, dry, intact. Reviewed d/c paperwork with patient and son. Reviewed new medications and d/c meds. Answered all questions. Wheeled stable patient and belongings to main entrance where he was picked up by his son to d/c to home. I told them [place] would call them today or tomorrow. Patient/caregiver able to verbalize understanding.  Discharge Medication: Allergies as of 08/17/2019      Reactions   Vancomycin Hives   Patient received Benadryl to treat the hives      Medication List    STOP taking these medications   amLODipine 5 MG tablet Commonly known as: NORVASC   Invokana 100 MG Tabs tablet Generic drug: canagliflozin     TAKE these medications   acetaminophen 325 MG tablet Commonly known as: TYLENOL Take 2 tablets (650 mg total) by mouth every 6 (six) hours as needed for mild pain, fever or headache.   allopurinol 300 MG tablet Commonly known as: ZYLOPRIM Take 1 tablet (300 mg total) by mouth daily.   calcitRIOL 0.5 MCG capsule Commonly known as: ROCALTROL Take 1 capsule (0.5 mcg total) by mouth daily.   citalopram 20 MG tablet Commonly known as: CELEXA Take 1 tablet (20 mg total) by mouth at bedtime.   cyanocobalamin 1000 MCG tablet Take 1 tablet (1,000 mcg total) by mouth daily.   CYCLOPHOSPHAMIDE IV Inject into the vein every 21 ( twenty-one) days. Start taking on: August 20, 2019   DOXORUBICIN HCL IV Inject into the vein every 21 ( twenty-one) days. Start taking on: October 14, [id number]   folic acid 1 MG tablet Commonly known as: FOLVITE Take 1 tablet (1 mg total) by mouth daily.   gabapentin 300 MG capsule Commonly known as: NEURONTIN Take 2 capsules (600 mg total) by mouth 3 (three) times daily.   lidocaine-prilocaine cream Commonly known as:  EMLA Apply a small amount to port a cath site and cover with plastic wrap 1 hour prior to chemotherapy appointments   metFORMIN 500 MG tablet Commonly known as: GLUCOPHAGE Take 1 tablet (500 mg total) by mouth daily.   NON FORMULARY Diet: Regular, NAS, Consistent Carbohydrate   ondansetron 4 MG tablet Commonly known as: ZOFRAN Take 1 tablet (4 mg total) by mouth every 6 (six) hours as needed for nausea.   pantoprazole 40 MG tablet Commonly known as: PROTONIX Take 1 tablet (40 mg total) by mouth 2 (two) times daily before a meal.   polyethylene glycol 17 g packet Commonly known as: MIRALAX / GLYCOLAX Take 17 g by mouth daily as needed for mild constipation.   predniSONE 20 MG tablet Commonly known as: DELTASONE Take 3 tablets (60 mg total) by mouth daily. Take on days 1-5 of chemotherapy.   primidone 50 MG tablet Commonly known as: MYSOLINE Take 1.5 tablets (75 mg total) by mouth every 8 (eight) hours.   prochlorperazine 10 MG tablet Commonly known as: COMPAZINE Take 1 tablet (10 mg total) by mouth every 6 (six) hours as needed (Nausea or vomiting).   propranolol 40 MG tablet Commonly known as: INDERAL Take 1 tablet (40 mg total) by mouth 2 (two) times daily.   riTUXimab in sodium chloride 0.9 % 250 mL Inject into the vein every 21 ( twenty-one) days. Start taking on: August 20, 2019   sucralfate 1  GM/10ML suspension Commonly known as: CARAFATE Take 10 mLs (1 g total) by mouth 4 (four) times daily -  with meals and at bedtime.   tamsulosin 0.4 MG Caps capsule Commonly known as: FLOMAX Take 1 capsule (0.4 mg total) by mouth daily. Give 30 minutes after same meal daily. Do not open/crush/chew   temazepam 15 MG capsule Commonly known as: RESTORIL Take 1 capsule (15 mg total) by mouth at bedtime as needed for sleep.   topiramate 100 MG tablet Commonly known as: TOPAMAX Take 2 tablets (200 mg total) by mouth at bedtime.   vinCRIStine 2 mg in sodium chloride 0.9 %  50 mL Inject 2 mg into the vein every 21 ( twenty-one) days. Start taking on: August 20, 2019       Discharge Assessment: Vitals:   08/17/19 0445 08/17/19 1333  BP: (!) 115/58 (!) 126/55  Pulse: 83 85  Resp: 19 18  Temp: 97.8 F (36.6 C) 98.3 F (36.8 C)  SpO2: 100% 96%   Skin clean, dry and intact without evidence of skin break down, no evidence of skin tears noted. IV catheter discontinued intact. Site without signs and symptoms of complications - no redness or edema noted at insertion site, patient denies c/o pain - only slight tenderness at site.  Dressing with slight pressure applied.  D/c Instructions-Education: Discharge instructions given to patient/family with verbalized understanding. D/c education completed with patient/family including follow up instructions, medication list, d/c activities limitations if indicated, with other d/c instructions as indicated by MD - patient able to verbalize understanding, all questions fully answered. Patient instructed to return to ED, call 911, or call MD for any changes in condition.  Patient escorted via [place], and D/C home via private auto.  Santa Lighter, RN 08/17/2019 3:56 PM

## 2019-08-17 NOTE — Evaluation (Signed)
Physical Therapy Evaluation Patient Details Name: Daniel Richards MRN: [id number] DOB: 1946-09-17 Today's Date: 08/17/2019   History of Present Illness  Daniel Richards is a 73 y.o. male with a history of diabetes, hypertension, recent diagnosis of B-cell lymphoma with chemotherapy to start next week.  Patient presents with weakness, chills, confusion that started earlier this morning.  He was brought to the emergency department for evaluation.  His symptoms are slightly worsened than before.  No palliating or provoking factors.  He was admitted in September due to similar complaints of confusion and found to be hypercalcemic.  Clinical Impression  Pt agreeable to PT evaluation. Pt adamant that he has enough assistance at home due to now living with daughter, having a hospital bed and manual WC, and has people coming to the house to assist. Pt with moderate unsteadiness upon standing, weight shift posterior requiring assistance to prevent loss of balance backwards. Pt tolerates steps at bedside, but unable to ambulate to bathroom or door of room due to weakness and unsteadiness. Pt with initial dizziness with positional changes, but improves with time. Pt declines transferring to bedside chair due to going home soon despite much education and encouragement to sit upright to improve dizziness and strength. Pt left in bed with call bell in reach, bed alarm on and RN in room administering medication. Pt will benefit from continued physical therapy in hospital and recommended venue below to increase strength, balance, endurance for safe ADLs and gait.     Follow Up Recommendations SNF;Supervision/Assistance - 24 hour    Equipment Recommendations  None recommended by PT    Recommendations for Other Services       Precautions / Restrictions Precautions Precautions: Fall Restrictions Weight Bearing Restrictions: No      Mobility  Bed Mobility Overal bed mobility: Needs Assistance Bed Mobility:  Supine to Sit;Sit to Supine     Supine to sit: Min assist;HOB elevated Sit to supine: Min assist   General bed mobility comments: increased time, use of bedrail and elevated HOB to upright trunk, min assist to lift BLE back into bed for sit to supine  Transfers Overall transfer level: Needs assistance Equipment used: Rolling walker (2 wheeled) Transfers: Sit to/from Stand Sit to Stand: Min assist         General transfer comment: rocking momentum to rise, education to push from seated surface  Ambulation/Gait Ambulation/Gait assistance: Mod assist Gait Distance (Feet): 4 Feet Assistive device: Rolling walker (2 wheeled) Gait Pattern/deviations: Decreased step length - right;Decreased step length - left;Decreased stride length Gait velocity: decreased   General Gait Details: tolerated sidesteps at EOB, moderate unsteadiness with weight shift too far posterior requiring assistance to prevent loss of balance backwards  Stairs            Wheelchair Mobility    Modified Rankin (Stroke Patients Only)       Balance Overall balance assessment: Needs assistance Sitting-balance support: Feet supported;No upper extremity supported Sitting balance-Leahy Scale: Good Sitting balance - Comments: seated EOB   Standing balance support: During functional activity;Bilateral upper extremity supported Standing balance-Leahy Scale: Poor Standing balance comment: with RW                             Pertinent Vitals/Pain Pain Assessment: 0-10 Pain Score: 6  Pain Location: L leg knee to ankle, circumferentially Pain Descriptors / Indicators: Aching("like a headache") Pain Intervention(s): Limited activity within patient's tolerance;Monitored during session;Patient requesting  pain meds-RN notified    Home Living Family/patient expects to be discharged to:: Private residence(Daughter's house) Living Arrangements: Children Available Help at Discharge: Family;Available  24 hours/day Type of Home: Mobile home Home Access: Ramped entrance     Home Layout: One level Home Equipment: [place] - single point;Walker - 2 wheels;Wheelchair - manual;Hospital bed      Prior Function Level of Independence: Needs assistance   Gait / Transfers Assistance Needed: Pt reports walking limited household distances with assistance from daughter, daughter assists pt out of bed and transfers, occasionally in manul WC around the home  ADL's / Homemaking Assistance Needed: Pt reports his daughter assists with ADLs and pt is currently residing with daughter  Comments: Pt reports son is living at pt's house, pt has been living with daughter since d/c from last hospital stay due to assistance needed     Hand Dominance   Dominant Hand: Right    Extremity/Trunk Assessment   Upper Extremity Assessment Upper Extremity Assessment: Generalized weakness    Lower Extremity Assessment Lower Extremity Assessment: RLE deficits/detail;LLE deficits/detail RLE Deficits / Details: grossly 3+/5 RLE Sensation: (denies numbness/tingling) LLE Deficits / Details: grossly 3+/5 LLE Sensation: (denies numbness/tingling)    Cervical / Trunk Assessment Cervical / Trunk Assessment: Kyphotic  Communication   Communication: No difficulties  Cognition Arousal/Alertness: Awake/alert Behavior During Therapy: WFL for tasks assessed/performed Overall Cognitive Status: Within Functional Limits for tasks assessed                                 General Comments: oriented to self, place, situation, year      General Comments      Exercises     Assessment/Plan    PT Assessment Patient needs continued PT services  PT Problem List Decreased strength;Decreased activity tolerance;Decreased balance;Decreased mobility       PT Treatment Interventions Gait training;Balance training;Functional mobility training;Therapeutic activities;Therapeutic exercise;Patient/family education     PT Goals (Current goals can be found in the Care Plan section)  Acute Rehab PT Goals Patient Stated Goal: return home with family to assist, no rehab PT Goal Formulation: With patient Time For Goal Achievement: 08/31/19 Potential to Achieve Goals: Good    Frequency Min 3X/week   Barriers to discharge        Co-evaluation               AM-PAC PT "6 Clicks" Mobility  Outcome Measure Help needed turning from your back to your side while in a flat bed without using bedrails?: A Little Help needed moving from lying on your back to sitting on the side of a flat bed without using bedrails?: A Little Help needed moving to and from a bed to a chair (including a wheelchair)?: A Lot Help needed standing up from a chair using your arms (e.g., wheelchair or bedside chair)?: A Little Help needed to walk in hospital room?: A Lot Help needed climbing 3-5 steps with a railing? : A Lot 6 Click Score: 15    End of Session Equipment Utilized During Treatment: Gait belt Activity Tolerance: Patient tolerated treatment well;Patient limited by fatigue Patient left: in bed;with call bell/phone within reach;with bed alarm set;with nursing/sitter in room Nurse Communication: Mobility status PT Visit Diagnosis: Other abnormalities of gait and mobility (R26.89);Unsteadiness on feet (R26.81);Muscle weakness (generalized) (M62.81)    Time: [id number] PT Time Calculation (min) (ACUTE ONLY): 20 min   Charges:   PT Evaluation $  PT Eval Moderate Complexity: 1 Mod PT Treatments $Therapeutic Activity: 8-22 mins        Talbot Grumbling PT, DPT 08/17/19, 9:36 AM [phone]

## 2019-08-17 NOTE — Plan of Care (Signed)
  Problem: Education: Goal: Knowledge of General Education information will improve Description: Including pain rating scale, medication(s)/side effects and non-pharmacologic comfort measures 08/17/2019 1447 by Santa Lighter, RN Outcome: Adequate for Discharge 08/17/2019 [place] by Santa Lighter, RN Outcome: Progressing 08/17/2019 [place] by Santa Lighter, RN Outcome: Progressing   Problem: Health Behavior/Discharge Planning: Goal: Ability to manage health-related needs will improve 08/17/2019 1447 by Santa Lighter, RN Outcome: Adequate for Discharge 08/17/2019 [place] by Santa Lighter, RN Outcome: Progressing 08/17/2019 [place] by Santa Lighter, RN Outcome: Progressing   Problem: Clinical Measurements: Goal: Ability to maintain clinical measurements within normal limits will improve Outcome: Adequate for Discharge Goal: Will remain free from infection 08/17/2019 1447 by Santa Lighter, RN Outcome: Adequate for Discharge 08/17/2019 [place] by Santa Lighter, RN Outcome: Progressing 08/17/2019 [place] by Santa Lighter, RN Outcome: Progressing Goal: Diagnostic test results will improve Outcome: Adequate for Discharge Goal: Respiratory complications will improve Outcome: Adequate for Discharge Goal: Cardiovascular complication will be avoided Outcome: Adequate for Discharge   Problem: Activity: Goal: Risk for activity intolerance will decrease Outcome: Adequate for Discharge   Problem: Nutrition: Goal: Adequate nutrition will be maintained Outcome: Adequate for Discharge   Problem: Coping: Goal: Level of anxiety will decrease Outcome: Adequate for Discharge   Problem: Elimination: Goal: Will not experience complications related to bowel motility Outcome: Adequate for Discharge Goal: Will not experience complications related to urinary retention Outcome: Adequate for Discharge   Problem: Pain Managment: Goal: General experience of comfort will improve Outcome: Adequate for  Discharge   Problem: Safety: Goal: Ability to remain free from injury will improve Outcome: Adequate for Discharge   Problem: Skin Integrity: Goal: Risk for impaired skin integrity will decrease Outcome: Adequate for Discharge

## 2019-08-17 NOTE — TOC Transition Note (Signed)
Transition of Care [facility]) - CM/SW Discharge Note   Patient Details  Name: Daniel Richards MRN: [id number] Date of Birth: 1946/02/20  [facility] CM/SW Contact:  Weston Anna, LCSW Phone Number: 08/17/2019, 3:43 PM   Clinical Narrative:     Patient set to discharge home with [facility] services- referral sent to [place]. CSW awaiting call back from [facility] to determine if referral has been accepted. Once referral has been accepted CSW will update note and update family.   Final next level of care: Home w Home Health Services Barriers to Discharge: No Barriers Identified   Patient Goals and CMS Choice        Discharge Placement                    Patient and family notified of of transfer: 08/17/19  Discharge Plan and Services                DME Arranged: N/A         HH Arranged: PT, RN, Nurse's Aide [place] Agency: [place] ([place]) Date HH Agency Contacted: 08/17/19 Time [place]: 58 Representative spoke with at [place]: [place] ([place]) Interventions     Readmission Risk Interventions Readmission Risk Prevention Plan 07/30/2019 07/28/2019  Post Dischage Appt Not Complete -  Appt Comments [facility] MD will follow while pt at short term rehab -  Medication Screening - Complete  Transportation Screening - Complete  Some recent data might be hidden

## 2019-08-17 NOTE — Plan of Care (Signed)
  Problem: Education: Goal: Knowledge of General Education information will improve Description: Including pain rating scale, medication(s)/side effects and non-pharmacologic comfort measures Outcome: Progressing   Problem: Health Behavior/Discharge Planning: Goal: Ability to manage health-related needs will improve Outcome: Progressing   Problem: Clinical Measurements: Goal: Will remain free from infection Outcome: Progressing   

## 2019-08-17 NOTE — Discharge Instructions (Signed)
1) you have declined transfer to skilled nursing facility for rehab at this time--you have home health physical therapy and nurse come in to help you rehab and recover at home 2) please keep your appointment with Dr. Delton Coombes your oncologist for Wednesday, 08/20/2019 3)-please take your medications as prescribed 4) you will need repeat renal panel test including calcium during ER visit with Dr. Delton Coombes on Wednesday, 08/20/2019 5) please stop amlodipine/Norvasc for now as  blood pressure is somewhat soft, continue propanolol 6) please stop Invokana for now as blood sugars are borderline, okay to continue metformin.... You might need Invokana again if you do go on steroids around your chemotherapy treatment period

## 2019-08-18 ENCOUNTER — Encounter (HOSPITAL_COMMUNITY): Payer: Self-pay | Admitting: Anatomic Pathology & Clinical Pathology

## 2019-08-19 ENCOUNTER — Other Ambulatory Visit (HOSPITAL_COMMUNITY): Payer: Self-pay | Admitting: Emergency Medicine

## 2019-08-19 ENCOUNTER — Inpatient Hospital Stay (HOSPITAL_COMMUNITY): Payer: Medicare Other | Admitting: General Practice

## 2019-08-19 ENCOUNTER — Encounter (HOSPITAL_COMMUNITY): Payer: Self-pay | Admitting: General Practice

## 2019-08-19 NOTE — Progress Notes (Signed)
[place] Initial Psychosocial Assessment Clinical Social Work  Clinical Social Work contacted by phone to assess psychosocial, emotional, mental health, and spiritual needs of the patient.   Barriers to care/review of distress screen:  - Transportation:  Do you anticipate any problems getting to appointments?  Do you have someone who can help run errands for you if you need it?  Family members will help him get into/out of car.  Has ramp into/out of house.   - Help at home:  What is your living situation (alone, family, other)?  If you are physically unable to care for yourself, who would you call on to help you?  Lives w daughter, moved in approx 2 weeks ago.  Was living on his own, daughter hopes he will be able to return home.  "He literally cant do anything for himself."  Was in [facility] "a few days" - family was concerned when they heard positive COVID cases had arisen at the SNF.  Felt it was safer to bring him home.   - Support system:  What does your support system look like?  Who would you call on if you needed some kind of practical help?  What if you needed someone to talk to for emotional support?  [facility] is coming out - had RN visit and PT.  All DME is in place per daughter.  Rn comes once/week, PT comes once/week and aide once/week.  Daughter has twin daughters who are CNAs - they can assist w patient as needed.  Has family members who come in and check on him and daughter.   - Finances:  Are you concerned about finances (I know, we may not want to go there.Marland Kitchen).  Considering returning to work?  If not, applying for disability?  Daughter is working on applying for [employer].  Gets Social Security income.    What is your understanding of where you are with your cancer? Its cause?  Your treatment plan and what happens next?  New diagnosis of cancer - went to hospital for back pain and was diagnosed w cancer. "He has really gone downhill in the past few weeks."  In general, decline  started approx a month ago - weight has dropped since hospital admission "loses 5 pounds/week", starts first chemo tomorrow.  Has had tests to determine if cancer has spread to the brain.  "He does a lot of sleeping, very weak."  "Only thing he complains about is arthritis in his knee."  Has frequent contact w family members.  Was not an active person prior to diagnosis, daughter wonders if he has strength to fit the cancer.  When he lived on his own "he just would lay around a lot, ne's never been very sociable."  Was tired often, isolated due to [place].  Was married years ago - has lived on his own a long time.  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  HAs been retired for 10 years.    What are your worries for the future as you begin treatment for cancer?   Has not expressed any concerns re cancer treatments, "always makes sure to tell us he loves [place]."  Doesn't complain about anything.  Daughter is worried about her ability to maintain her small business as she is 24/7 caregiver.  Daughter also had recent surgery and has lifting restrictions. Has no appetite at all - daughter is trying to increase protein "just a little bit fills him up."  What are your hopes and priorities during your treatment? What is important to you? What are your goals for your care?  Takes things one day at a time, agreeable to treatment.  "when all the family comes over he tries to be social w them."  "Just wants to get better."  Daughter thinks "he is really concerned, not sure the chemo will work."  Daughter has been through similar experience w step relative who had liver cancer and could not tolerate chemo side effects.  Worries that patient's lack of reserve and poor physical conditioning prior to treatment/diagnosis will make him less able to tolerate chemo.   CSW Summary:  Patient and family psychosocial functioning including strengths, limitations, and coping skills:  Family is very engaged in  caring for patient - took him home from [facility] after hearing of positive COVID cases in the SNF.  Now living w daughter and requiring 24/7 care.  Cannot get out of bed unassisted, has tremors which requires help w feeding, cannot use toilet independently, cannot walk.  Requires wheelchair.  Family has members who are in the health care field, but daughter is the primary caregiver. She has given up her small business as she has no time to devote to this while caring for patient.  Patient is severely fatigued, sleeps "a lot."  Diagnosis was unexpected - had back pain for some time, went to hospital after pain increased, was diagnosed w cancer.  [place] is providing once/week PT, RN and Aide services.  Daughter is applying for Medicaid; however patients income may be too high.    Identifications of barriers to care:  No resources for in home care.  Medicare only.  Daughter cannot care for bedbound patient and maintain her small business.  Availability of community resources:  ADTS caregiver support, Consulting civil engineer, [employer] $500 grant program, Dietitian;  Daughter working on [employer] application.    Clinical Social Worker follow up needed: Yes.  Call in two weeks to review progress/needs.  Edwyna Shell, LCSW Clinical Social Worker Phone:  [phone] Cell:  [phone]

## 2019-08-20 ENCOUNTER — Encounter (HOSPITAL_COMMUNITY): Payer: Self-pay | Admitting: Hematology

## 2019-08-20 ENCOUNTER — Other Ambulatory Visit (HOSPITAL_COMMUNITY): Payer: Self-pay | Admitting: *Deleted

## 2019-08-20 ENCOUNTER — Inpatient Hospital Stay (HOSPITAL_COMMUNITY): Payer: Medicare Other

## 2019-08-20 ENCOUNTER — Ambulatory Visit (HOSPITAL_COMMUNITY): Payer: Medicare Other | Admitting: Hematology

## 2019-08-20 ENCOUNTER — Inpatient Hospital Stay (HOSPITAL_BASED_OUTPATIENT_CLINIC_OR_DEPARTMENT_OTHER): Payer: Medicare Other | Admitting: Hematology

## 2019-08-20 ENCOUNTER — Other Ambulatory Visit: Payer: Self-pay

## 2019-08-20 ENCOUNTER — Other Ambulatory Visit (HOSPITAL_COMMUNITY): Payer: Medicare Other

## 2019-08-20 ENCOUNTER — Encounter (HOSPITAL_COMMUNITY): Payer: Self-pay

## 2019-08-20 DIAGNOSIS — C8330 Diffuse large B-cell lymphoma, unspecified site: Secondary | ICD-10-CM | POA: Insufficient documentation

## 2019-08-20 DIAGNOSIS — C8513 Unspecified B-cell lymphoma, intra-abdominal lymph nodes: Secondary | ICD-10-CM

## 2019-08-20 DIAGNOSIS — C8336 Diffuse large B-cell lymphoma, intrapelvic lymph nodes: Secondary | ICD-10-CM | POA: Diagnosis not present

## 2019-08-20 DIAGNOSIS — D702 Other drug-induced agranulocytosis: Secondary | ICD-10-CM

## 2019-08-20 DIAGNOSIS — Z95828 Presence of other vascular implants and grafts: Secondary | ICD-10-CM

## 2019-08-20 DIAGNOSIS — Z5111 Encounter for antineoplastic chemotherapy: Secondary | ICD-10-CM | POA: Diagnosis not present

## 2019-08-20 LAB — COMPREHENSIVE METABOLIC PANEL
ALT: 18 U/L (ref 0–44)
AST: 22 U/L (ref 15–41)
Albumin: 2.7 g/dL — ABNORMAL LOW (ref 3.5–5.0)
Alkaline Phosphatase: 65 U/L (ref 38–126)
Anion gap: 10 (ref 5–15)
BUN: 21 mg/dL (ref 8–23)
CO2: 21 mmol/L — ABNORMAL LOW (ref 22–32)
Calcium: 11.8 mg/dL — ABNORMAL HIGH (ref 8.9–10.3)
Chloride: 103 mmol/L (ref 98–111)
Creatinine, Ser: 1.29 mg/dL — ABNORMAL HIGH (ref 0.61–1.24)
GFR calc Af Amer: >60 mL/min (ref >60)
GFR calc non Af Amer: 54 mL/min — ABNORMAL LOW (ref >60)
Glucose, Bld: 198 mg/dL — ABNORMAL HIGH (ref 70–99)
Potassium: 3.7 mmol/L (ref 3.5–5.1)
Sodium: 134 mmol/L — ABNORMAL LOW (ref 135–145)
Total Bilirubin: 0.3 mg/dL (ref 0.3–1.2)
Total Protein: 7 g/dL (ref 6.5–8.1)

## 2019-08-20 LAB — URIC ACID: Uric Acid, Serum: 3.4 mg/dL — ABNORMAL LOW (ref 3.7–8.6)

## 2019-08-20 LAB — CBC WITH DIFFERENTIAL/PLATELET
Band Neutrophils: 4 %
Basophils Absolute: 0 10*3/uL (ref 0.0–0.1)
Basophils Relative: 0 %
Eosinophils Absolute: 0.1 10*3/uL (ref 0.0–0.5)
Eosinophils Relative: 2 %
HCT: 29.3 % — ABNORMAL LOW (ref 39.0–52.0)
Hemoglobin: 9.1 g/dL — ABNORMAL LOW (ref 13.0–17.0)
Lymphocytes Relative: 45 %
Lymphs Abs: 1.4 10*3/uL (ref 0.7–4.0)
MCH: 31.8 pg (ref 26.0–34.0)
MCHC: 31.1 g/dL (ref 30.0–36.0)
MCV: 102.4 fL — ABNORMAL HIGH (ref 80.0–100.0)
Metamyelocytes Relative: 2 %
Monocytes Absolute: 0.6 10*3/uL (ref 0.1–1.0)
Monocytes Relative: 20 %
Neutro Abs: 0.9 10*3/uL — ABNORMAL LOW (ref 1.7–7.7)
Neutrophils Relative %: 27 %
Platelets: 349 10*3/uL (ref 150–400)
RBC: 2.86 MIL/uL — ABNORMAL LOW (ref 4.22–5.81)
RDW: 15 % (ref 11.5–15.5)
WBC: 3 10*3/uL — ABNORMAL LOW (ref 4.0–10.5)
nRBC: 0 % (ref 0.0–0.2)

## 2019-08-20 LAB — PHOSPHORUS: Phosphorus: 1.9 mg/dL — ABNORMAL LOW (ref 2.5–4.6)

## 2019-08-20 LAB — MAGNESIUM: Magnesium: 1.9 mg/dL (ref 1.7–2.4)

## 2019-08-20 MED ORDER — SODIUM CHLORIDE 0.9% FLUSH
10.0000 mL | INTRAVENOUS | Status: DC | PRN
  Administered 2019-08-20: 10 mL
  Filled 2019-08-20: qty 10

## 2019-08-20 MED ORDER — TEMAZEPAM 15 MG PO CAPS: 15.0000 mg | ORAL_CAPSULE | Freq: Every evening | ORAL | 0 refills | Status: DC | PRN

## 2019-08-20 MED ORDER — MEPERIDINE HCL 50 MG/ML IJ SOLN
12.5000 mg | Freq: Once | INTRAMUSCULAR | Status: AC
  Administered 2019-08-20: 12.5 mg via INTRAVENOUS

## 2019-08-20 MED ORDER — FILGRASTIM-SNDZ 480 MCG/0.8ML IJ SOSY
480.0000 ug | PREFILLED_SYRINGE | Freq: Once | INTRAMUSCULAR | Status: AC
  Administered 2019-08-20: 480 ug via SUBCUTANEOUS
  Filled 2019-08-20: qty 0.8

## 2019-08-20 MED ORDER — MEPERIDINE HCL 50 MG/ML IJ SOLN
INTRAMUSCULAR | Status: AC
  Filled 2019-08-20: qty 1

## 2019-08-20 MED ORDER — SODIUM CHLORIDE 0.9 % IV SOLN
375.0000 mg/m2 | Freq: Once | INTRAVENOUS | Status: AC
  Administered 2019-08-20: 800 mg via INTRAVENOUS
  Filled 2019-08-20: qty 50

## 2019-08-20 MED ORDER — ACETAMINOPHEN 325 MG PO TABS
650.0000 mg | ORAL_TABLET | Freq: Once | ORAL | Status: AC
  Administered 2019-08-20: 650 mg via ORAL

## 2019-08-20 MED ORDER — K PHOS MONO-SOD PHOS DI & MONO 155-852-130 MG PO TABS
500.0000 mg | ORAL_TABLET | Freq: Once | ORAL | Status: AC
  Administered 2019-08-20: 500 mg via ORAL
  Filled 2019-08-20: qty 2

## 2019-08-20 MED ORDER — ZOLEDRONIC ACID 4 MG/100ML IV SOLN
4.0000 mg | Freq: Once | INTRAVENOUS | Status: AC
  Administered 2019-08-20: 4 mg via INTRAVENOUS
  Filled 2019-08-20: qty 100

## 2019-08-20 MED ORDER — ACETAMINOPHEN 325 MG PO TABS
ORAL_TABLET | ORAL | Status: AC
  Filled 2019-08-20: qty 2

## 2019-08-20 MED ORDER — HEPARIN SOD (PORK) LOCK FLUSH 100 UNIT/ML IV SOLN
500.0000 [IU] | Freq: Once | INTRAVENOUS | Status: AC | PRN
  Administered 2019-08-20: 500 [IU]

## 2019-08-20 MED ORDER — ACETAMINOPHEN 325 MG PO TABS
650.0000 mg | ORAL_TABLET | Freq: Once | ORAL | Status: AC
  Administered 2019-08-20: 650 mg via ORAL
  Filled 2019-08-20: qty 2

## 2019-08-20 MED ORDER — FAMOTIDINE IN NACL 20-0.9 MG/50ML-% IV SOLN
20.0000 mg | Freq: Once | INTRAVENOUS | Status: AC
  Administered 2019-08-20: 20 mg via INTRAVENOUS
  Filled 2019-08-20: qty 50

## 2019-08-20 MED ORDER — SODIUM CHLORIDE 0.9 % IV SOLN
Freq: Once | INTRAVENOUS | Status: AC
  Administered 2019-08-20: 09:00:00 via INTRAVENOUS

## 2019-08-20 MED ORDER — DIPHENHYDRAMINE HCL 50 MG/ML IJ SOLN
50.0000 mg | Freq: Once | INTRAMUSCULAR | Status: AC
  Administered 2019-08-20: 50 mg via INTRAVENOUS
  Filled 2019-08-20: qty 1

## 2019-08-20 MED ORDER — SODIUM CHLORIDE 0.9 % IV SOLN
3.0000 mg | Freq: Once | INTRAVENOUS | Status: AC
  Administered 2019-08-20: 3 mg via INTRAVENOUS
  Filled 2019-08-20: qty 2

## 2019-08-20 NOTE — Patient Instructions (Signed)
[facility] at [facility] Discharge Instructions  You were seen today by Dr. Katragadda. He went over your recent lab results. He will see you back in 1 week for labs and follow up.   Thank you for choosing [facility] at [facility] to provide your oncology and hematology care.  To afford each patient quality time with our provider, please arrive at least 15 minutes before your scheduled appointment time.   If you have a lab appointment with the Cancer Center please come in thru the  Main Entrance and check in at the main information desk  You need to re-schedule your appointment should you arrive 10 or more minutes late.  We strive to give you quality time with our providers, and arriving late affects you and other patients whose appointments are after yours.  Also, if you no show three or more times for appointments you may be dismissed from the clinic at the providers discretion.     Again, thank you for choosing [facility].  Our hope is that these requests will decrease the amount of time that you wait before being seen by our physicians.       _____________________________________________________________  Should you have questions after your visit to [facility], please contact our office at [phone] between the hours of 8:00 a.m. and 4:30 p.m.  Voicemails left after 4:00 p.m. will not be returned until the following business day.  For prescription refill requests, have your pharmacy contact our office and allow 72 hours.    Cancer Center Support Programs:   > Cancer Support Group  2nd Tuesday of the month 1pm-2pm, Journey Room    

## 2019-08-20 NOTE — Progress Notes (Signed)
Daniel Richards, [place] [id number]   CLINIC:  Medical Oncology/Hematology  PCP:  [name], [place] [facility] [address] [place] [address] [phone]   REASON FOR VISIT:  Follow-up for lymphoma.  CURRENT THERAPY: Under work-up.   INTERVAL HISTORY:  Daniel Richards 73 y.o. male seen for follow-up of lymphoma.  He underwent lumbar puncture for CSF assessment of lymphoma involvement.  He was reportedly in the hospital for couple of days last week as his calcium level went up and he got confused.  He received IV hydration but did not receive any bisphosphonates.  According to the family he is slightly weaker.  He is also confused at times.  Appetite is 25%.  Chronic pain in the left leg is stable.  Denies any fevers or chills.  He has an indwelling Foley catheter since last admission 3 weeks ago.    REVIEW OF SYSTEMS:  Review of Systems  Constitutional: Positive for fatigue.  All other systems reviewed and are negative.    PAST MEDICAL/SURGICAL HISTORY:  Past Medical History:  Diagnosis Date  . Anxiety and depression   . Diabetes mellitus 06/09/2011   pt taking metformin  . DJD of shoulder    right   . Hyperlipidemia   . Hypertension   . Migraines   . Port-A-Cath in place 08/13/2019  . Tremor   . Vitamin D deficiency    Past Surgical History:  Procedure Laterality Date  . BACK SURGERY    . KNEE SURGERY    . PORTACATH PLACEMENT Left 08/08/2019   Procedure: INSERTION PORT-A-CATH;  Surgeon: Aviva Signs, MD;  Location: [facility];  Service: General;  Laterality: Left;  . SHOULDER SURGERY       SOCIAL HISTORY:  Social History   Socioeconomic History  . Marital status: Divorced    Spouse name: Not on file  . Number of children: 2  . Years of education: 12+  . Highest education level: Not on file  Occupational History  . Not on file  Social Needs  . Financial resource strain:  Somewhat hard  . Food insecurity    Worry: Never true    Inability: Never true  . Transportation needs    Medical: No    Non-medical: No  Tobacco Use  . Smoking status: Never Smoker  . Smokeless tobacco: Never Used  Substance and Sexual Activity  . Alcohol use: No  . Drug use: No  . Sexual activity: Not Currently  Lifestyle  . Physical activity    Days per week: 0 days    Minutes per session: 0 min  . Stress: Not at all  Relationships  . Social Herbalist on phone: Never    Gets together: Twice a week    Attends religious service: More than 4 times per year    Active member of club or organization: No    Attends meetings of clubs or organizations: Never    Relationship status: Divorced  . Intimate partner violence    Fear of current or ex partner: No    Emotionally abused: No    Physically abused: No    Forced sexual activity: No  Other Topics Concern  . Not on file  Social History Narrative   Lives at home with son.   Caffeine use: Drinks 1 cup coffee (3 cups per week-decaf)    FAMILY HISTORY:  Family History  Problem Relation Age of Onset  . Cancer Sister   . Cancer Sister   . Cancer Brother   . Heart attack Father   . Cancer Brother   . Heart attack Brother   . Healthy Son   . Healthy Daughter   . Migraines Neg Hx     CURRENT MEDICATIONS:  Outpatient Encounter Medications as of 08/20/2019  Medication Sig Note  . acetaminophen (TYLENOL) 325 MG tablet Take 2 tablets (650 mg total) by mouth every 6 (six) hours as needed for mild pain, fever or headache.   . allopurinol (ZYLOPRIM) 300 MG tablet Take 1 tablet (300 mg total) by mouth daily.   . calcitRIOL (ROCALTROL) 0.5 MCG capsule Take 1 capsule (0.5 mcg total) by mouth daily.   . citalopram (CELEXA) 20 MG tablet Take 1 tablet (20 mg total) by mouth at bedtime.   . CYCLOPHOSPHAMIDE IV Inject into the vein every 21 ( twenty-one) days. 08/15/2019: Will start chemotherapy on Wed 08/20/19  .  DOXORUBICIN HCL IV Inject into the vein every 21 ( twenty-one) days. 08/15/2019: Will start chemotherapy on Wed 08/20/19  . folic acid (FOLVITE) 1 MG tablet Take 1 tablet (1 mg total) by mouth daily.   Marland Kitchen gabapentin (NEURONTIN) 300 MG capsule Take 2 capsules (600 mg total) by mouth 3 (three) times daily.   Marland Kitchen lidocaine-prilocaine (EMLA) cream Apply a small amount to port a cath site and cover with plastic wrap 1 hour prior to chemotherapy appointments 08/15/2019: Will use when he starts chemotherapy on Wed 08/20/19  . metFORMIN (GLUCOPHAGE) 500 MG tablet Take 1 tablet (500 mg total) by mouth daily.   . NON FORMULARY Diet: Regular, NAS, Consistent Carbohydrate   . ondansetron (ZOFRAN) 4 MG tablet Take 1 tablet (4 mg total) by mouth every 6 (six) hours as needed for nausea.   . pantoprazole (PROTONIX) 40 MG tablet Take 1 tablet (40 mg total) by mouth 2 (two) times daily before a meal.   . polyethylene glycol (MIRALAX / GLYCOLAX) 17 g packet Take 17 g by mouth daily as needed for mild constipation.   . predniSONE (DELTASONE) 20 MG tablet Take 3 tablets (60 mg total) by mouth daily. Take on days 1-5 of chemotherapy. 08/15/2019: Will start when he prepare for chemotherapy on Wed 08/20/19  . primidone (MYSOLINE) 50 MG tablet Take 1.5 tablets (75 mg total) by mouth every 8 (eight) hours.   . prochlorperazine (COMPAZINE) 10 MG tablet Take 1 tablet (10 mg total) by mouth every 6 (six) hours as needed (Nausea or vomiting). 08/15/2019: Will start when he starts chemotherapy on Wed 08/20/19  . propranolol (INDERAL) 40 MG tablet Take 1 tablet (40 mg total) by mouth 2 (two) times daily.   . riTUXimab in sodium chloride 0.9 % 250 mL Inject into the vein every 21 ( twenty-one) days. 08/15/2019: Will start chemotherapy on Wed 08/20/19  . sucralfate (CARAFATE) 1 GM/10ML suspension Take 10 mLs (1 g total) by mouth 4 (four) times daily -  with meals and at bedtime.   . tamsulosin (FLOMAX) 0.4 MG CAPS capsule Take 1 capsule (0.4  mg total) by mouth daily. Give 30 minutes after same meal daily. Do not open/crush/chew   . topiramate (TOPAMAX) 100 MG tablet Take 2 tablets (200 mg total) by mouth at bedtime.   . vinCRIStine 2 mg in sodium chloride 0.9 % 50 mL Inject 2 mg into the vein every 21 ( twenty-one) days. 08/15/2019: Will start chemotherapy on Wed 08/20/19  . vitamin  B-12 1000 MCG tablet Take 1 tablet (1,000 mcg total) by mouth daily. (Patient not taking: Reported on 08/15/2019)   . [DISCONTINUED] temazepam (RESTORIL) 15 MG capsule Take 1 capsule (15 mg total) by mouth at bedtime as needed for sleep.    No facility-administered encounter medications on file as of 08/20/2019.     ALLERGIES:  Allergies  Allergen Reactions  . Vancomycin Hives    Patient received Benadryl to treat the hives     PHYSICAL EXAM:  ECOG Performance status: 2  There were no vitals filed for this visit. There were no vitals filed for this visit.  Physical Exam Vitals signs reviewed.  Constitutional:      Appearance: Normal appearance.  Cardiovascular:     Rate and Rhythm: Normal rate and regular rhythm.     Heart sounds: Normal heart sounds.  Pulmonary:     Effort: Pulmonary effort is normal.     Breath sounds: Normal breath sounds.  Abdominal:     General: There is no distension.     Palpations: Abdomen is soft. There is no mass.  Musculoskeletal:        General: No swelling.  Skin:    General: Skin is warm.  Neurological:     General: No focal deficit present.     Mental Status: He is alert and oriented to person, place, and time.  Psychiatric:        Mood and Affect: Mood normal.        Behavior: Behavior normal.   I have reviewed his vitals.  Blood pressure is 110/51 pulse rate is 104 respiratory is 18 temperature 97.8.  Oxygen saturation is 96%.   LABORATORY DATA:  I have reviewed the labs as listed.  CBC    Component Value Date/Time   WBC 3.0 (L) 08/20/2019 0742   RBC 2.86 (L) 08/20/2019 0742   HGB 9.1  (L) 08/20/2019 0742   HCT 29.3 (L) 08/20/2019 0742   PLT 349 08/20/2019 0742   MCV 102.4 (H) 08/20/2019 0742   MCH 31.8 08/20/2019 0742   MCHC 31.1 08/20/2019 0742   RDW 15.0 08/20/2019 0742   LYMPHSABS 1.4 08/20/2019 0742   MONOABS 0.6 08/20/2019 0742   EOSABS 0.1 08/20/2019 0742   BASOSABS 0.0 08/20/2019 0742   CMP Latest Ref Rng & Units 08/20/2019 08/17/2019 08/16/2019  Glucose 70 - 99 mg/dL 198(H) 114(H) 107(H)  BUN 8 - 23 mg/dL _0 Creatinine 0.61 - 1.24 mg/dL 1.29(H) 1.31(H) 1.37(H)  Sodium 135 - 145 mmol/L 134(L) 137 135  Potassium 3.5 - 5.1 mmol/L 3.7 3.6 3.9  Chloride 98 - 111 mmol/L 103 105 103  CO2 22 - 32 mmol/L 21(L) 19(L) 20(L)  Calcium 8.9 - 10.3 mg/dL 11.8(H) 10.4(H) 10.1  Total Protein 6.5 - 8.1 g/dL 7.0 - 6.7  Total Bilirubin 0.3 - 1.2 mg/dL 0.3 - 0.9  Alkaline Phos 38 - 126 U/L 65 - 68  AST 15 - 41 U/L 22 - 23  ALT 0 - 44 U/L 18 - 17       DIAGNOSTIC IMAGING:  I have independently reviewed the scans and discussed with the patient.     ASSESSMENT & PLAN:   Diffuse large B cell lymphoma (HCC) 1.  Stage IV diffuse large B-cell lymphoma: -31-monthhistory of generalized weakness, no B symptoms. -CTAP showed retroperitoneal, left pelvic, external iliac adenopathy, largest lymph node measuring 7.5 cm.  LDH was normal. -MRI of the thoracic and lumbar spine did not show  any spinal involvement. - IR biopsy of the left retroperitoneal lymph node on 07/30/2019, consistent with high-grade lymphoma.  Ki-67 was 40-50%.  Positive for CD20, CD5 DM, BCL-2, BCL 6.  Negative for CD10, CD3. -PET scan on 08/07/2019 showed lymphoma within nodal stations of the neck, chest, abdomen and pelvis.  Heterogeneous foci of marrow hypermetabolism consistent with lymphomatous involvement.  Right sphenoid sinus soft tissue density and hypermetabolism. - Lumbar puncture negative for lymphoma involvement. -2D echo on 08/05/2019 shows EF 65-70%. -He was recently admitted to the  hospital with hypercalcemia for couple of days and received IV hydration. - FISH testing for high-grade lymphoma is negative. - His Richards count today is low with ANC of 900.  This is likely from lymphoma involvement of the bone marrow. - We will proceed with rituximab today.  He is already on allopurinol.  We will give him Neupogen today. -He will come back tomorrow for chemotherapy.  He will also receive Neulasta support.  I will give him mini CHOP doses.   2.  Hypercalcemia: -Had hypercalcemia of 10.6 with albumin of 3, received Zometa on 07/28/2019. -His calcium today is 11.8 with albumin of 2.7.  He will receive IV hydration and zoledronic acid.  3.  TLS prophylaxis: -He received rasburicase 3 mg today.  He will continue allopurinol.  His uric acid is normal today. - We will check his labs again tomorrow.  4.  Sleep problems: - We will start him on Restoril 15 mg and see if it helps.  5.  Hypophosphatemia: - His phosphate level today is 1.9.  We gave him Neutra-Phos 500 mg p.o. x1. -We will check his phosphate level tomorrow.   Total time spent is 40 minutes with more than 50% of the time spent face-to-face discussing treatment plan, side effects, counseling and coordination of care.  Orders placed this encounter:  Orders Placed This Encounter  Procedures  . CBC with Differential/Platelet  . Comprehensive metabolic panel  . Magnesium  . Comprehensive metabolic panel  . Magnesium  . Phosphorus  . Uric acid      Derek Jack, MD [place] [phone]

## 2019-08-20 NOTE — Assessment & Plan Note (Signed)
1.  Stage IV diffuse large B-cell lymphoma: -76-monthhistory of generalized weakness, no B symptoms. -CTAP showed retroperitoneal, left pelvic, external iliac adenopathy, largest lymph node measuring 7.5 cm.  LDH was normal. -MRI of the thoracic and lumbar spine did not show any spinal involvement. - IR biopsy of the left retroperitoneal lymph node on 07/30/2019, consistent with high-grade lymphoma.  Ki-67 was 40-50%.  Positive for CD20, CD5 DM, BCL-2, BCL 6.  Negative for CD10, CD3. -PET scan on 08/07/2019 showed lymphoma within nodal stations of the neck, chest, abdomen and pelvis.  Heterogeneous foci of marrow hypermetabolism consistent with lymphomatous involvement.  Right sphenoid sinus soft tissue density and hypermetabolism. - Lumbar puncture negative for lymphoma involvement. -2D echo on 08/05/2019 shows EF 65-70%. -He was recently admitted to the hospital with hypercalcemia for couple of days and received IV hydration. - FISH testing for high-grade lymphoma is negative. - His white count today is low with ANC of 900.  This is likely from lymphoma involvement of the bone marrow. - We will proceed with rituximab today.  He is already on allopurinol.  We will give him Neupogen today. -He will come back tomorrow for chemotherapy.  He will also receive Neulasta support.  I will give him mini CHOP doses.   2.  Hypercalcemia: -Had hypercalcemia of 10.6 with albumin of 3, received Zometa on 07/28/2019. -His calcium today is 11.8 with albumin of 2.7.  He will receive IV hydration and zoledronic acid.  3.  TLS prophylaxis: -He received rasburicase 3 mg today.  He will continue allopurinol.  His uric acid is normal today. - We will check his labs again tomorrow.  4.  Sleep problems: - We will start him on Restoril 15 mg and see if it helps.  5.  Hypophosphatemia: - His phosphate level today is 1.9.  We gave him Neutra-Phos 500 mg p.o. x1. -We will check his phosphate level tomorrow.

## 2019-08-20 NOTE — Progress Notes (Signed)
Pt presents today for f/u with Dr. Delton Coombes. VSS and within parameters for tx. MAR reviewed and updated. Labs pending. Daughter and son at the bedside for doctor visit. Plan of care moving forward today [name] the son will stay with the patient until the afternoon and then Tammy the daughter will come to relieve him. Pt is confined to bed completely and the daughter states he doesn't get out of the bed at home.   Recheck HR 78  14:02 Stopped Rituxan. NS bolus at 569mls per /hr initiated at this time.Upon entering room pt shaking and states he is freezing. VS obtained. BP unable to read due to pt shaking.  Pt has chronic trembles per his words.   14:06 Per Janan Ridge RN spoke with Dr. Delton Coombes pertaining to pt's status. VO received to give 12.5 mg of Demerol now x 1 dose. Wait 15 min and give another 12.5 of Demerol. Reported BP's unable to obtain due to pt shaking. Rigors noted. Pt has no complaints of pain, SOB, hives, itching or fever.   14:20 Rigors improved but not resolved. 12.5mg  of Demerol given IV push at this time. Dose #2. IV fluids decreased to 117ml/hr.   14:27 Pt alert and oriented. Pt states, " I feel better." No rigors noted at this time. Rituxan remains clamped off at this time. NS bolus infusing.   1432:  Dr. Delton Coombes notified rigors resolved with Demerol 12.5 mg IV x 2.  Per MD, may restart rituximab at 100 mg/hr (last dose tolerated prior to rigors) per MD.   14:34 Restarted Rituxan.  1630 VO received to give 650mg  of Tylenol. Last temp 99.5 .   17:10 Reported temp of 102 to Dr. Delton Coombes. No new orders received. Per MD continue administratiod of Rituxan.   Treatment given today per MD orders. Tolerated infusion without adverse affects. Vital signs stable. No complaints at this time. Discharged from clinic via wheel chair.F/U with [facility] as scheduled.

## 2019-08-20 NOTE — Progress Notes (Signed)
08/20/19  Received orders for today:  Give Rituximab today and CHOP tomorrow 10/15.  Give:  Zarxio 480 mcg SubQ x 1 today           Zometa 4 mg IVPB x 1 today for hypercalcemia (cor Calcium 12.84)           K Phos 500 mg oral x 1            Elitek 3 mg IVPB x 1 today           NS 1 liter bolus x 1  T.O. Dr Beckey Downing LPN/Gretchen Weinfeld Ronnald Ramp, PharmD

## 2019-08-20 NOTE — Patient Instructions (Signed)
[facility] Discharge Instructions for Patients Receiving Chemotherapy  Today you received the following chemotherapy agents   To help prevent nausea and vomiting after your treatment, we encourage you to take your nausea medication   If you develop nausea and vomiting that is not controlled by your nausea medication, call the clinic.   BELOW ARE SYMPTOMS THAT SHOULD BE REPORTED IMMEDIATELY:  *FEVER GREATER THAN 100.5 F  *CHILLS WITH OR WITHOUT FEVER  NAUSEA AND VOMITING THAT IS NOT CONTROLLED WITH YOUR NAUSEA MEDICATION  *UNUSUAL SHORTNESS OF BREATH  *UNUSUAL BRUISING OR BLEEDING  TENDERNESS IN MOUTH AND THROAT WITH OR WITHOUT PRESENCE OF ULCERS  *URINARY PROBLEMS  *BOWEL PROBLEMS  UNUSUAL RASH Items with * indicate a potential emergency and should be followed up as soon as possible.  Feel free to call the clinic should you have any questions or concerns. The clinic phone number is [phone].  Please show the CHEMO ALERT CARD at check-in to the Emergency Department and triage nurse.   

## 2019-08-21 ENCOUNTER — Other Ambulatory Visit (HOSPITAL_COMMUNITY): Payer: Self-pay | Admitting: *Deleted

## 2019-08-21 ENCOUNTER — Inpatient Hospital Stay (HOSPITAL_COMMUNITY): Payer: Medicare Other

## 2019-08-21 VITALS — BP 130/67 | HR 104 | Temp 97.7°F | Resp 18

## 2019-08-21 DIAGNOSIS — Z95828 Presence of other vascular implants and grafts: Secondary | ICD-10-CM

## 2019-08-21 DIAGNOSIS — C8513 Unspecified B-cell lymphoma, intra-abdominal lymph nodes: Secondary | ICD-10-CM

## 2019-08-21 DIAGNOSIS — D702 Other drug-induced agranulocytosis: Secondary | ICD-10-CM

## 2019-08-21 DIAGNOSIS — Z5111 Encounter for antineoplastic chemotherapy: Secondary | ICD-10-CM | POA: Diagnosis not present

## 2019-08-21 LAB — CBC WITH DIFFERENTIAL/PLATELET
Basophils Absolute: 0 10*3/uL (ref 0.0–0.1)
Basophils Relative: 1 %
Eosinophils Absolute: 0.1 10*3/uL (ref 0.0–0.5)
Eosinophils Relative: 4 %
HCT: 27.2 % — ABNORMAL LOW (ref 39.0–52.0)
Hemoglobin: 8.4 g/dL — ABNORMAL LOW (ref 13.0–17.0)
Lymphocytes Relative: 20 %
Lymphs Abs: 0.6 10*3/uL — ABNORMAL LOW (ref 0.7–4.0)
MCH: 31.8 pg (ref 26.0–34.0)
MCHC: 30.9 g/dL (ref 30.0–36.0)
MCV: 103 fL — ABNORMAL HIGH (ref 80.0–100.0)
Monocytes Absolute: 0.8 10*3/uL (ref 0.1–1.0)
Monocytes Relative: 27 %
Neutro Abs: 1.3 10*3/uL — ABNORMAL LOW (ref 1.7–7.7)
Neutrophils Relative %: 42 %
Platelets: 303 10*3/uL (ref 150–400)
RBC: 2.64 MIL/uL — ABNORMAL LOW (ref 4.22–5.81)
RDW: 15.2 % (ref 11.5–15.5)
WBC: 3 10*3/uL — ABNORMAL LOW (ref 4.0–10.5)
nRBC: 0 % (ref 0.0–0.2)

## 2019-08-21 LAB — COMPREHENSIVE METABOLIC PANEL
ALT: 17 U/L (ref 0–44)
AST: 25 U/L (ref 15–41)
Albumin: 2.6 g/dL — ABNORMAL LOW (ref 3.5–5.0)
Alkaline Phosphatase: 67 U/L (ref 38–126)
Anion gap: 10 (ref 5–15)
BUN: 20 mg/dL (ref 8–23)
CO2: 22 mmol/L (ref 22–32)
Calcium: 11.2 mg/dL — ABNORMAL HIGH (ref 8.9–10.3)
Chloride: 104 mmol/L (ref 98–111)
Creatinine, Ser: 1.17 mg/dL (ref 0.61–1.24)
GFR calc Af Amer: >60 mL/min (ref >60)
GFR calc non Af Amer: >60 mL/min (ref >60)
Glucose, Bld: 189 mg/dL — ABNORMAL HIGH (ref 70–99)
Potassium: 3.3 mmol/L — ABNORMAL LOW (ref 3.5–5.1)
Sodium: 136 mmol/L (ref 135–145)
Total Bilirubin: 0.4 mg/dL (ref 0.3–1.2)
Total Protein: 6.5 g/dL (ref 6.5–8.1)

## 2019-08-21 LAB — CULTURE, BLOOD (ROUTINE X 2)
Culture: NO GROWTH
Culture: NO GROWTH
Special Requests: ADEQUATE
Special Requests: ADEQUATE

## 2019-08-21 LAB — MAGNESIUM: Magnesium: 1.8 mg/dL (ref 1.7–2.4)

## 2019-08-21 LAB — URIC ACID: Uric Acid, Serum: 1.6 mg/dL — ABNORMAL LOW (ref 3.7–8.6)

## 2019-08-21 LAB — PHOSPHORUS: Phosphorus: 2 mg/dL — ABNORMAL LOW (ref 2.5–4.6)

## 2019-08-21 MED ORDER — PALONOSETRON HCL INJECTION 0.25 MG/5ML
INTRAVENOUS | Status: AC
  Filled 2019-08-21: qty 5

## 2019-08-21 MED ORDER — VINCRISTINE SULFATE CHEMO INJECTION 1 MG/ML
1.0000 mg | Freq: Once | INTRAVENOUS | Status: AC
  Administered 2019-08-21: 1 mg via INTRAVENOUS
  Filled 2019-08-21: qty 1

## 2019-08-21 MED ORDER — SODIUM CHLORIDE 0.9 % IV SOLN
Freq: Once | INTRAVENOUS | Status: AC
  Administered 2019-08-21: 12:00:00 via INTRAVENOUS
  Filled 2019-08-21: qty 5

## 2019-08-21 MED ORDER — PALONOSETRON HCL INJECTION 0.25 MG/5ML
0.2500 mg | Freq: Once | INTRAVENOUS | Status: AC
  Administered 2019-08-21: 0.25 mg via INTRAVENOUS

## 2019-08-21 MED ORDER — SODIUM CHLORIDE 0.9 % IV SOLN
INTRAVENOUS | Status: DC
  Administered 2019-08-21: 11:00:00 via INTRAVENOUS

## 2019-08-21 MED ORDER — DOXORUBICIN HCL CHEMO IV INJECTION 2 MG/ML
25.0000 mg/m2 | Freq: Once | INTRAVENOUS | Status: AC
  Administered 2019-08-21: 52 mg via INTRAVENOUS
  Filled 2019-08-21: qty 26

## 2019-08-21 MED ORDER — K PHOS MONO-SOD PHOS DI & MONO 155-852-130 MG PO TABS
500.0000 mg | ORAL_TABLET | Freq: Once | ORAL | Status: AC
  Administered 2019-08-21: 500 mg via ORAL
  Filled 2019-08-21: qty 2

## 2019-08-21 MED ORDER — TEMAZEPAM 15 MG PO CAPS: 15.0000 mg | ORAL_CAPSULE | Freq: Every evening | ORAL | 0 refills | Status: DC | PRN

## 2019-08-21 MED ORDER — SODIUM CHLORIDE 0.9 % IV SOLN
Freq: Once | INTRAVENOUS | Status: AC
  Administered 2019-08-21: 11:00:00 via INTRAVENOUS

## 2019-08-21 MED ORDER — HEPARIN SOD (PORK) LOCK FLUSH 100 UNIT/ML IV SOLN
500.0000 [IU] | Freq: Once | INTRAVENOUS | Status: AC | PRN
  Administered 2019-08-21: 500 [IU]

## 2019-08-21 MED ORDER — SODIUM CHLORIDE 0.9 % IV SOLN
490.0000 mg/m2 | Freq: Once | INTRAVENOUS | Status: AC
  Administered 2019-08-21: 1000 mg via INTRAVENOUS
  Filled 2019-08-21: qty 50

## 2019-08-21 MED ORDER — SODIUM CHLORIDE 0.9% FLUSH
10.0000 mL | INTRAVENOUS | Status: DC | PRN
  Administered 2019-08-21: 10 mL
  Filled 2019-08-21: qty 10

## 2019-08-21 NOTE — Patient Instructions (Signed)
[facility] Discharge Instructions for Patients Receiving Chemotherapy  Today you received the following chemotherapy agents   To help prevent nausea and vomiting after your treatment, we encourage you to take your nausea medication   If you develop nausea and vomiting that is not controlled by your nausea medication, call the clinic.   BELOW ARE SYMPTOMS THAT SHOULD BE REPORTED IMMEDIATELY:  *FEVER GREATER THAN 100.5 F  *CHILLS WITH OR WITHOUT FEVER  NAUSEA AND VOMITING THAT IS NOT CONTROLLED WITH YOUR NAUSEA MEDICATION  *UNUSUAL SHORTNESS OF BREATH  *UNUSUAL BRUISING OR BLEEDING  TENDERNESS IN MOUTH AND THROAT WITH OR WITHOUT PRESENCE OF ULCERS  *URINARY PROBLEMS  *BOWEL PROBLEMS  UNUSUAL RASH Items with * indicate a potential emergency and should be followed up as soon as possible.  Feel free to call the clinic should you have any questions or concerns. The clinic phone number is [phone].  Please show the CHEMO ALERT CARD at check-in to the Emergency Department and triage nurse.   

## 2019-08-21 NOTE — Progress Notes (Signed)
08/21/19  Give 500 mg orally of Potassium Phosphate today x 1  Orders entered.  T.O. Dr Rhys Martini, PharmD

## 2019-08-21 NOTE — Progress Notes (Signed)
Pt presents today for chemo. Rituxan adminstered 08/20/19. Labs repeated this am and reviewed with Dr. Delton Coombes. VO received to proceed with tx. Give 500mg  PO of phosphorous and a 569ml bolus of NS today. Per MD make appt for Monday with the NP for repeat labs to see if pt needs fluids. Pt has no complaints of any changes since the last visit. Reported pt's daughter states he has a little cough this morning worse than normal per daughter's words. Sats 94% on room air today.    Treatment given today per MD orders. Tolerated infusion without adverse affects. Vital signs stable. No complaints at this time. Discharged from clinic via wheel chair.  F/U with [facility] as scheduled.

## 2019-08-22 ENCOUNTER — Telehealth (HOSPITAL_COMMUNITY): Payer: Self-pay

## 2019-08-22 ENCOUNTER — Inpatient Hospital Stay (HOSPITAL_COMMUNITY): Payer: Medicare Other

## 2019-08-22 ENCOUNTER — Encounter (HOSPITAL_COMMUNITY): Payer: Self-pay

## 2019-08-22 ENCOUNTER — Other Ambulatory Visit: Payer: Self-pay

## 2019-08-22 VITALS — BP 99/53 | HR 88 | Temp 97.5°F | Resp 18

## 2019-08-22 DIAGNOSIS — Z5111 Encounter for antineoplastic chemotherapy: Secondary | ICD-10-CM | POA: Diagnosis not present

## 2019-08-22 DIAGNOSIS — Z95828 Presence of other vascular implants and grafts: Secondary | ICD-10-CM

## 2019-08-22 DIAGNOSIS — C8513 Unspecified B-cell lymphoma, intra-abdominal lymph nodes: Secondary | ICD-10-CM

## 2019-08-22 MED ORDER — PEGFILGRASTIM-JMDB 6 MG/0.6ML ~~LOC~~ SOSY
6.0000 mg | PREFILLED_SYRINGE | Freq: Once | SUBCUTANEOUS | Status: AC
  Administered 2019-08-22: 6 mg via SUBCUTANEOUS

## 2019-08-22 NOTE — Telephone Encounter (Signed)
Called and spoke with daughter [name] today to check on the patient. Tammy states he had a wonderful night, slept all night, drank his Boost and was more alert than he normally is. Pt teaching performed. Understanding verbalized.

## 2019-08-22 NOTE — Progress Notes (Signed)
Daniel Richards tolerated Fulphila injection well without complaints or incident. Reviewed purpose and side effects of this medication as well as use of Claritin and Tylenol or pain medication to reduce discomforts with pt and his son with understanding verbalized. VSS Pt discharged via wheelchair in satisfactory condition accompanied by his son

## 2019-08-22 NOTE — Patient Instructions (Signed)
[facility] Discharge Instructions  Received Fulphila injection today. Follow-up as scheduled. Call clinic for any questions or concerns   Thank you for choosing [facility] at [facility] to provide your oncology and hematology care.  To afford each patient quality time with our provider, please arrive at least 15 minutes before your scheduled appointment time.   If you have a lab appointment with the Cancer Center please come in thru the Main Entrance and check in at the main information desk.  You need to re-schedule your appointment should you arrive 10 or more minutes late.  We strive to give you quality time with our providers, and arriving late affects you and other patients whose appointments are after yours.  Also, if you no show three or more times for appointments you may be dismissed from the clinic at the providers discretion.     Again, thank you for choosing [facility].  Our hope is that these requests will decrease the amount of time that you wait before being seen by our physicians.       _____________________________________________________________  Should you have questions after your visit to  Cancer Center, please contact our office at [phone] between the hours of 8:00 a.m. and 4:30 p.m.  Voicemails left after 4:00 p.m. will not be returned until the following business day.  For prescription refill requests, have your pharmacy contact our office and allow 72 hours.    Due to Covid, you will need to wear a mask upon entering the hospital. If you do not have a mask, a mask will be given to you at the Main Entrance upon arrival. For doctor visits, patients may have 1 support person with them. For treatment visits, patients can not have anyone with them due to social distancing guidelines and our immunocompromised population.     

## 2019-08-25 ENCOUNTER — Inpatient Hospital Stay (HOSPITAL_COMMUNITY): Payer: Medicare Other

## 2019-08-25 ENCOUNTER — Other Ambulatory Visit: Payer: Self-pay

## 2019-08-25 ENCOUNTER — Inpatient Hospital Stay (HOSPITAL_BASED_OUTPATIENT_CLINIC_OR_DEPARTMENT_OTHER): Payer: Medicare Other | Admitting: Hematology

## 2019-08-25 ENCOUNTER — Encounter (HOSPITAL_COMMUNITY): Payer: Self-pay | Admitting: Hematology

## 2019-08-25 VITALS — BP 126/64 | HR 87 | Temp 98.6°F | Resp 20 | Wt 172.3 lb

## 2019-08-25 DIAGNOSIS — C8513 Unspecified B-cell lymphoma, intra-abdominal lymph nodes: Secondary | ICD-10-CM

## 2019-08-25 DIAGNOSIS — R509 Fever, unspecified: Secondary | ICD-10-CM | POA: Diagnosis not present

## 2019-08-25 DIAGNOSIS — T83511A Infection and inflammatory reaction due to indwelling urethral catheter, initial encounter: Secondary | ICD-10-CM | POA: Diagnosis not present

## 2019-08-25 LAB — COMPREHENSIVE METABOLIC PANEL
ALT: 18 U/L (ref 0–44)
AST: 20 U/L (ref 15–41)
Albumin: 2.7 g/dL — ABNORMAL LOW (ref 3.5–5.0)
Alkaline Phosphatase: 63 U/L (ref 38–126)
Anion gap: 10 (ref 5–15)
BUN: 39 mg/dL — ABNORMAL HIGH (ref 8–23)
CO2: 24 mmol/L (ref 22–32)
Calcium: 9.6 mg/dL (ref 8.9–10.3)
Chloride: 103 mmol/L (ref 98–111)
Creatinine, Ser: 1.2 mg/dL (ref 0.61–1.24)
GFR calc Af Amer: >60 mL/min (ref >60)
GFR calc non Af Amer: 59 mL/min — ABNORMAL LOW (ref >60)
Glucose, Bld: 157 mg/dL — ABNORMAL HIGH (ref 70–99)
Potassium: 4 mmol/L (ref 3.5–5.1)
Sodium: 137 mmol/L (ref 135–145)
Total Bilirubin: 0.5 mg/dL (ref 0.3–1.2)
Total Protein: 6.6 g/dL (ref 6.5–8.1)

## 2019-08-25 LAB — CBC WITH DIFFERENTIAL/PLATELET
Abs Immature Granulocytes: 0.12 10*3/uL — ABNORMAL HIGH (ref 0.00–0.07)
Basophils Absolute: 0 10*3/uL (ref 0.0–0.1)
Basophils Relative: 3 %
Eosinophils Absolute: 0 10*3/uL (ref 0.0–0.5)
Eosinophils Relative: 0 %
HCT: 28 % — ABNORMAL LOW (ref 39.0–52.0)
Hemoglobin: 8.6 g/dL — ABNORMAL LOW (ref 13.0–17.0)
Immature Granulocytes: 14 %
Lymphocytes Relative: 59 %
Lymphs Abs: 0.5 10*3/uL — ABNORMAL LOW (ref 0.7–4.0)
MCH: 31.7 pg (ref 26.0–34.0)
MCHC: 30.7 g/dL (ref 30.0–36.0)
MCV: 103.3 fL — ABNORMAL HIGH (ref 80.0–100.0)
Monocytes Absolute: 0.1 10*3/uL (ref 0.1–1.0)
Monocytes Relative: 9 %
Neutro Abs: 0.1 10*3/uL — ABNORMAL LOW (ref 1.7–7.7)
Neutrophils Relative %: 15 %
Platelets: 276 10*3/uL (ref 150–400)
RBC: 2.71 MIL/uL — ABNORMAL LOW (ref 4.22–5.81)
RDW: 14.8 % (ref 11.5–15.5)
WBC: 0.8 10*3/uL — CL (ref 4.0–10.5)
nRBC: 0 % (ref 0.0–0.2)

## 2019-08-25 LAB — URINALYSIS, DIPSTICK ONLY
Glucose, UA: 50 mg/dL — AB
Hgb urine dipstick: NEGATIVE
Ketones, ur: NEGATIVE mg/dL
Nitrite: NEGATIVE
Protein, ur: >=300 mg/dL — AB
Specific Gravity, Urine: 1.022 (ref 1.005–1.030)
pH: 9 — ABNORMAL HIGH (ref 5.0–8.0)

## 2019-08-25 LAB — URIC ACID: Uric Acid, Serum: 3.7 mg/dL (ref 3.7–8.6)

## 2019-08-25 LAB — MAGNESIUM: Magnesium: 2.1 mg/dL (ref 1.7–2.4)

## 2019-08-25 LAB — PHOSPHORUS: Phosphorus: 3 mg/dL (ref 2.5–4.6)

## 2019-08-25 MED ORDER — HEPARIN SOD (PORK) LOCK FLUSH 100 UNIT/ML IV SOLN: 500.0000 [IU] | Freq: Once | INTRAVENOUS | Status: DC

## 2019-08-25 MED ORDER — SODIUM CHLORIDE 0.9 % IV SOLN
INTRAVENOUS | Status: AC
  Administered 2019-08-25: 11:00:00 via INTRAVENOUS

## 2019-08-25 MED ORDER — SODIUM CHLORIDE 0.9 % IV SOLN: INTRAVENOUS | Status: DC

## 2019-08-25 MED ORDER — SULFAMETHOXAZOLE-TRIMETHOPRIM 800-160 MG PO TABS: 1.0000 | ORAL_TABLET | Freq: Two times a day (BID) | ORAL | 0 refills | Status: DC

## 2019-08-25 NOTE — Patient Instructions (Signed)
[facility] at [facility] Discharge Instructions  Labs drawn from portacath today   Thank you for choosing [facility] at [facility] to provide your oncology and hematology care.  To afford each patient quality time with our provider, please arrive at least 15 minutes before your scheduled appointment time.   If you have a lab appointment with the Cancer Center please come in thru the Main Entrance and check in at the main information desk.  You need to re-schedule your appointment should you arrive 10 or more minutes late.  We strive to give you quality time with our providers, and arriving late affects you and other patients whose appointments are after yours.  Also, if you no show three or more times for appointments you may be dismissed from the clinic at the providers discretion.     Again, thank you for choosing [facility].  Our hope is that these requests will decrease the amount of time that you wait before being seen by our physicians.       _____________________________________________________________  Should you have questions after your visit to [facility], please contact our office at [phone] between the hours of 8:00 a.m. and 4:30 p.m.  Voicemails left after 4:00 p.m. will not be returned until the following business day.  For prescription refill requests, have your pharmacy contact our office and allow 72 hours.    Due to Covid, you will need to wear a mask upon entering the hospital. If you do not have a mask, a mask will be given to you at the Main Entrance upon arrival. For doctor visits, patients may have 1 support person with them. For treatment visits, patients can not have anyone with them due to social distancing guidelines and our immunocompromised population.     

## 2019-08-25 NOTE — Assessment & Plan Note (Signed)
1.  Stage IV diffuse large B-cell lymphoma: -1-monthhistory of generalized weakness, no B symptoms. -CTAP showed retroperitoneal, left pelvic, external iliac adenopathy, largest lymph node measuring 7.5 cm.  LDH was normal. -MRI of the thoracic and lumbar spine did not show any spinal involvement. - IR biopsy of the left retroperitoneal lymph node on 07/30/2019, consistent with high-grade lymphoma.  Ki-67 was 40-50%.  Positive for CD20, CD5 DM, BCL-2, BCL 6.  Negative for CD10, CD3. -PET scan on 08/07/2019 showed lymphoma within nodal stations of the neck, chest, abdomen and pelvis.  Heterogeneous foci of marrow hypermetabolism consistent with lymphomatous involvement.  Right sphenoid sinus soft tissue density and hypermetabolism. - Lumbar puncture negative for lymphoma involvement. -2D echo on 08/05/2019 shows EF 65-70%. -He was recently admitted to the hospital with hypercalcemia for couple of days and received IV hydration. - FISH sting for high-grade lymphoma is negative. - WBC is 0.8, ANC 100. He received Filgrastim on 08/22/2019. U/A consistent with UTI. Will start patient on Bactrim 800/160 BID x 7 days. Also, send urine for culture. Advised patient and family to monitor for fever greater than 100.4 as this is an oncology emergency.  - We will administer 1L NS today in clinic as well.   2.  Hypercalcemia: - Calcium with in normal at 9.6   3.  TLS prophylaxis: -He received rasburicase 3 mg today.  He will continue allopurinol.  His uric acid is normal today.   4.  Sleep problems: - We will start him on Restoril 15 mg and see if it helps.  5.  Hypophosphatemia: - Phosphorous is with in normal.

## 2019-08-25 NOTE — Progress Notes (Signed)
1045:  Critical value alert:  WBC 0.8  Primary RN aware, provider aware.  She will see patient in clinic and then give orders if needed.

## 2019-08-25 NOTE — Progress Notes (Signed)
Hydration given today per orders. Urine culture and urinalysis done per orders.   Treatment given per orders. Patient tolerated it well without problems. Vitals stable and discharged home from clinic via wheelchair with son. Follow up as scheduled.

## 2019-08-25 NOTE — Addendum Note (Signed)
Addended by: Donnie Aho on: 08/25/2019 02:35 PM   Modules accepted: Orders

## 2019-08-25 NOTE — Progress Notes (Signed)
[facility] Follow up:    Daniel Richards, [place] [address] [place] [address]   DIAGNOSIS: Cancer Staging Diffuse large B cell lymphoma ([place]) Staging form: Hodgkin and Non-Hodgkin Lymphoma, AJCC 8th Edition - Clinical stage from 08/20/2019: Stage IV (Diffuse large B-cell lymphoma) - Signed by Derek Jack, MD on 08/20/2019   SUMMARY OF ONCOLOGIC HISTORY: Oncology History  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, unspecified B-cell lymphoma type   08/04/2019 Initial Diagnosis   B-cell lymphoma ([place])   08/20/2019 -  Chemotherapy   The patient had DOXOrubicin (ADRIAMYCIN) chemo injection 52 mg, 25 mg/m2 = 52 mg (50 % of original dose 50 mg/m2), Intravenous,  Once, 1 of 6 cycles Dose modification: 25 mg/m2 (50 % of original dose 50 mg/m2, Cycle 1, Reason: Provider Judgment) Administration: 52 mg (08/21/2019) palonosetron (ALOXI) injection 0.25 mg, 0.25 mg, Intravenous,  Once, 1 of 6 cycles Administration: 0.25 mg (08/21/2019) pegfilgrastim-jmdb (FULPHILA) injection 6 mg, 6 mg, Subcutaneous,  Once, 1 of 6 cycles Administration: 6 mg (08/22/2019) vinCRIStine (ONCOVIN) 1 mg in sodium chloride 0.9 % 50 mL chemo infusion, 1 mg (50 % of original dose 2 mg), Intravenous,  Once, 1 of 6 cycles Dose modification: 1 mg (50 % of original dose 2 mg, Cycle 1, Reason: Provider Judgment) Administration: 1 mg (08/21/2019) cyclophosphamide (CYTOXAN) 1,040 mg in sodium chloride 0.9 % 250 mL chemo infusion, 500 mg/m2 = 1,040 mg (66.7 % of original dose 750 mg/m2), Intravenous,  Once, 1 of 6 cycles Dose modification: 500 mg/m2 (66.7 % of original dose 750 mg/m2, Cycle 1, Reason: Provider Judgment) Administration: 1,000 mg (08/21/2019) fosaprepitant (EMEND) 150 mg, dexamethasone (DECADRON) 12 mg in sodium chloride 0.9 % 145 mL IVPB, , Intravenous,  Once, 1 of 6 cycles Administration:   (08/21/2019) riTUXimab-pvvr (RUXIENCE) 800 mg in sodium chloride 0.9 % 250 mL (2.4242 mg/mL) infusion, 375 mg/m2 = 800 mg, Intravenous,  Once, 1 of 6 cycles Administration: 800 mg (08/20/2019)  for chemotherapy treatment.    Diffuse large B cell lymphoma ([place])  08/20/2019 Initial Diagnosis   Diffuse large B cell lymphoma ([place])   08/20/2019 Cancer Staging   Staging form: Hodgkin and Non-Hodgkin Lymphoma, AJCC 8th Edition - Clinical stage from 08/20/2019: Stage IV (Diffuse large B-cell lymphoma) - Signed by Derek Jack, MD on 08/20/2019     CURRENT THERAPY: mini CHOP  INTERVAL HISTORY: Daniel Richards 73 y.o. male returns today for follow up. Reports overall doing fair. His daughter and son accompany him. He continues with tremors. His children are concerned he may have a UTI due to discoloration of urine in foley bag. He has no s/s of infection. No fevers, chills, night sweats. He does report fatigue. He continues with reports of dry cough. He is here for repeat labs and office visit.    Patient Active Problem List   Diagnosis Date Noted  . Neutropenia, drug-induced ([place]) 08/20/2019  . Diffuse large B cell lymphoma ([place]) 08/20/2019  . Port-A-Cath in place 08/13/2019  . Anemia of chronic disease 08/07/2019  . Major depression, recurrent, chronic ([place]) 08/07/2019  . BPH without urinary obstruction 08/07/2019  . Aortic atherosclerosis ([place]) 08/07/2019  . Diabetic peripheral neuropathy ([place]) 08/07/2019  . Hypertension associated with type 2 diabetes mellitus ([place]) 08/07/2019  . 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 Odynophagia   . Acute kidney injury ([place])   .  Falls frequently   . Generalized abdominal pain   . Goals of care, counseling/discussion   . Palliative care by specialist   . Folate deficiency   . Generalized weakness 07/25/2019  . Intra-abdominal lymphadenopathy 07/25/2019  .  Retroperitoneal lymphadenopathy 07/25/2019  . Malnutrition of moderate degree 07/25/2019  . Tremor   . Hypercalcemia 07/24/2019  . B12 deficiency 11/18/2016  . Weakness generalized 11/16/2016  . Altered mental status, unspecified 11/16/2016  . Fracture, rib 11/16/2016  . Weakness 11/16/2016  . New onset of headaches after age 70 09/01/2015  . Morning headache 09/01/2015  . Daytime somnolence 09/01/2015  . Essential tremor 09/01/2015  . Intractable headache 09/01/2015  . Type 2 diabetes, controlled, with peripheral neuropathy ([place]) 06/09/2011    is allergic to vancomycin.  MEDICAL HISTORY: Past Medical History:  Diagnosis Date  . Anxiety and depression   . Diabetes mellitus 06/09/2011   pt taking metformin  . DJD of shoulder    right   . Hyperlipidemia   . Hypertension   . Migraines   . Port-A-Cath in place 08/13/2019  . Tremor   . Vitamin D deficiency     SURGICAL HISTORY: Past Surgical History:  Procedure Laterality Date  . BACK SURGERY    . KNEE SURGERY    . PORTACATH PLACEMENT Left 08/08/2019   Procedure: INSERTION PORT-A-CATH;  Surgeon: Aviva Signs, MD;  Location: [facility];  Service: General;  Laterality: Left;  . SHOULDER SURGERY      SOCIAL HISTORY: Social History   Socioeconomic History  . Marital status: Divorced    Spouse name: Not on file  . Number of children: 2  . Years of education: 12+  . Highest education level: Not on file  Occupational History  . Not on file  Social Needs  . Financial resource strain: Somewhat hard  . Food insecurity    Worry: Never true    Inability: Never true  . Transportation needs    Medical: No    Non-medical: No  Tobacco Use  . Smoking status: Never Smoker  . Smokeless tobacco: Never Used  Substance and Sexual Activity  . Alcohol use: No  . Drug use: No  . Sexual activity: Not Currently  Lifestyle  . Physical activity    Days per week: 0 days    Minutes per session: 0 min  . Stress: Not at all   Relationships  . Social Herbalist on phone: Never    Gets together: Twice a week    Attends religious service: More than 4 times per year    Active member of club or organization: No    Attends meetings of clubs or organizations: Never    Relationship status: Divorced  . Intimate partner violence    Fear of current or ex partner: No    Emotionally abused: No    Physically abused: No    Forced sexual activity: No  Other Topics Concern  . Not on file  Social History Narrative   Lives at home with son.   Caffeine use: Drinks 1 cup coffee (3 cups per week-decaf)    FAMILY HISTORY: Family History  Problem Relation Age of Onset  . Cancer Sister   . Cancer Sister   . Cancer Brother   . Heart attack Father   . Cancer Brother   . Heart attack Brother   . Healthy Son   . Healthy Daughter   . Migraines Neg Hx     Review of  Systems  Constitutional: Positive for fatigue.  HENT:  Negative.   Eyes: Negative.   Respiratory: Positive for cough.   Cardiovascular: Negative.   Gastrointestinal: Negative.   Endocrine: Negative.   Genitourinary: Negative.    Musculoskeletal: Positive for arthralgias, back pain, gait problem and myalgias.  Skin: Negative.   Neurological: Positive for extremity weakness and gait problem.  Hematological: Negative.   Psychiatric/Behavioral: Negative.       PHYSICAL EXAMINATION  ECOG PERFORMANCE STATUS: 1 - Symptomatic but completely ambulatory  Vitals:   08/25/19 0958  BP: 126/64  Pulse: 87  Resp: 20  Temp: 98.6 F (37 C)  SpO2: 94%    Physical Exam Constitutional:      Appearance: Normal appearance. He is obese.     Comments: Generalized tremors  HENT:     Head: Normocephalic.     Right Ear: External ear normal.     Left Ear: External ear normal.     Nose: Nose normal.     Mouth/Throat:     Mouth: Mucous membranes are moist.     Pharynx: Oropharynx is clear.  Eyes:     Extraocular Movements: Extraocular movements  intact.     Conjunctiva/sclera: Conjunctivae normal.     Pupils: Pupils are equal, round, and reactive to light.  Neck:     Musculoskeletal: Normal range of motion.  Cardiovascular:     Rate and Rhythm: Normal rate and regular rhythm.     Pulses: Normal pulses.     Heart sounds: Normal heart sounds.  Pulmonary:     Effort: Pulmonary effort is normal.     Breath sounds: Normal breath sounds.  Abdominal:     General: Bowel sounds are normal.  Musculoskeletal: Normal range of motion.  Skin:    General: Skin is warm and dry.  Neurological:     General: No focal deficit present.     Mental Status: He is alert.     Comments: Generalized tremors   Psychiatric:        Mood and Affect: Mood normal.        Behavior: Behavior normal.        Thought Content: Thought content normal.        Judgment: Judgment normal.     LABORATORY DATA:  CBC    Component Value Date/Time   WBC 0.8 (LL) 08/25/2019 0947   RBC 2.71 (L) 08/25/2019 0947   HGB 8.6 (L) 08/25/2019 0947   HCT 28.0 (L) 08/25/2019 0947   PLT 276 08/25/2019 0947   MCV 103.3 (H) 08/25/2019 0947   MCH 31.7 08/25/2019 0947   MCHC 30.7 08/25/2019 0947   RDW 14.8 08/25/2019 0947   LYMPHSABS 0.5 (L) 08/25/2019 0947   MONOABS 0.1 08/25/2019 0947   EOSABS 0.0 08/25/2019 0947   BASOSABS 0.0 08/25/2019 0947    CMP     Component Value Date/Time   NA 137 08/25/2019 0947   NA 141 09/01/2015 1138   K 4.0 08/25/2019 0947   CL 103 08/25/2019 0947   CO2 24 08/25/2019 0947   GLUCOSE 157 (H) 08/25/2019 0947   BUN 39 (H) 08/25/2019 0947   BUN 16 09/01/2015 1138   CREATININE 1.20 08/25/2019 0947   CALCIUM 9.6 08/25/2019 0947   CALCIUM 10.1 07/29/2019 1244   PROT 6.6 08/25/2019 0947   ALBUMIN 2.7 (L) 08/25/2019 0947   AST 20 08/25/2019 0947   ALT 18 08/25/2019 0947   ALKPHOS 63 08/25/2019 0947   BILITOT 0.5 08/25/2019 0947  GFRNONAA 59 (L) 08/25/2019 0947   GFRAA >60 08/25/2019 0947         ASSESSMENT and THERAPY  PLAN:   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  Stage IV diffuse large B-cell lymphoma: -37-monthhistory of generalized weakness, no B symptoms. -CTAP showed retroperitoneal, left pelvic, external iliac adenopathy, largest lymph node measuring 7.5 cm.  LDH was normal. -MRI of the thoracic and lumbar spine did not show any spinal involvement. - IR biopsy of the left retroperitoneal lymph node on 07/30/2019, consistent with high-grade lymphoma.  Ki-67 was 40-50%.  Positive for CD20, CD5 DM, BCL-2, BCL 6.  Negative for CD10, CD3. -PET scan on 08/07/2019 showed lymphoma within nodal stations of the neck, chest, abdomen and pelvis.  Heterogeneous foci of marrow hypermetabolism consistent with lymphomatous involvement.  Right sphenoid sinus soft tissue density and hypermetabolism. - Lumbar puncture negative for lymphoma involvement. -2D echo on 08/05/2019 shows EF 65-70%. -He was recently admitted to the hospital with hypercalcemia for couple of days and received IV hydration. - FISH sting for high-grade lymphoma is negative. - WBC is 0.8, ANC 100. He received Filgrastim on 08/22/2019. U/A consistent with UTI. Will start patient on Bactrim 800/160 BID x 7 days. Also, send urine for culture. Advised patient and family to monitor for fever greater than 100.4 as this is an oncology emergency.  - We will administer 1L NS today in clinic as well.   2.  Hypercalcemia: - Calcium with in normal at 9.6   3.  TLS prophylaxis: -He received rasburicase 3 mg today.  He will continue allopurinol.  His uric acid is normal today.   4.  Sleep problems: - We will start him on Restoril 15 mg and see if it helps.  5.  Hypophosphatemia: - Phosphorous is with in normal.     Orders Placed This Encounter  Procedures  . CBC with Differential    Standing Status:   Future    Standing Expiration Date:   08/24/2020  . Comprehensive metabolic panel     Standing Status:   Future    Standing Expiration Date:   08/24/2020  . Magnesium    Standing Status:   Future    Standing Expiration Date:   08/24/2020  . Lactate dehydrogenase    Standing Status:   Future    Standing Expiration Date:   08/24/2020  . Uric acid    Standing Status:   Future    Standing Expiration Date:   08/24/2020  . Phosphorus    Standing Status:   Future    Standing Expiration Date:   08/24/2020    All questions were answered. The patient knows to call the clinic with any problems, questions or concerns. We can certainly see the patient much sooner if necessary. This note was electronically signed. KRoger Shelter FNP 08/25/2019

## 2019-08-26 ENCOUNTER — Telehealth (HOSPITAL_COMMUNITY): Payer: Self-pay | Admitting: *Deleted

## 2019-08-26 ENCOUNTER — Emergency Department (HOSPITAL_COMMUNITY): Payer: Medicare Other

## 2019-08-26 ENCOUNTER — Encounter (HOSPITAL_COMMUNITY): Payer: Self-pay

## 2019-08-26 ENCOUNTER — Inpatient Hospital Stay (HOSPITAL_COMMUNITY)
Admission: EM | Admit: 2019-08-26 | Discharge: 2019-08-31 | DRG: 698 | Disposition: A | Discharge status: Home Health | Payer: Medicare Other | Attending: Internal Medicine | Admitting: Internal Medicine

## 2019-08-26 ENCOUNTER — Other Ambulatory Visit: Payer: Self-pay

## 2019-08-26 DIAGNOSIS — E876 Hypokalemia: Secondary | ICD-10-CM | POA: Diagnosis present

## 2019-08-26 DIAGNOSIS — E785 Hyperlipidemia, unspecified: Secondary | ICD-10-CM | POA: Diagnosis present

## 2019-08-26 DIAGNOSIS — I10 Essential (primary) hypertension: Secondary | ICD-10-CM | POA: Diagnosis present

## 2019-08-26 DIAGNOSIS — C8333 Diffuse large B-cell lymphoma, intra-abdominal lymph nodes: Secondary | ICD-10-CM | POA: Diagnosis not present

## 2019-08-26 DIAGNOSIS — Z79899 Other long term (current) drug therapy: Secondary | ICD-10-CM

## 2019-08-26 DIAGNOSIS — Z9221 Personal history of antineoplastic chemotherapy: Secondary | ICD-10-CM | POA: Diagnosis not present

## 2019-08-26 DIAGNOSIS — I361 Nonrheumatic tricuspid (valve) insufficiency: Secondary | ICD-10-CM | POA: Diagnosis not present

## 2019-08-26 DIAGNOSIS — I2699 Other pulmonary embolism without acute cor pulmonale: Secondary | ICD-10-CM | POA: Diagnosis present

## 2019-08-26 DIAGNOSIS — R5081 Fever presenting with conditions classified elsewhere: Secondary | ICD-10-CM | POA: Diagnosis present

## 2019-08-26 DIAGNOSIS — N400 Enlarged prostate without lower urinary tract symptoms: Secondary | ICD-10-CM | POA: Diagnosis present

## 2019-08-26 DIAGNOSIS — Y846 Urinary catheterization as the cause of abnormal reaction of the patient, or of later complication, without mention of misadventure at the time of the procedure: Secondary | ICD-10-CM | POA: Diagnosis present

## 2019-08-26 DIAGNOSIS — D709 Neutropenia, unspecified: Secondary | ICD-10-CM | POA: Diagnosis present

## 2019-08-26 DIAGNOSIS — D649 Anemia, unspecified: Secondary | ICD-10-CM | POA: Diagnosis present

## 2019-08-26 DIAGNOSIS — B964 Proteus (mirabilis) (morganii) as the cause of diseases classified elsewhere: Secondary | ICD-10-CM | POA: Diagnosis present

## 2019-08-26 DIAGNOSIS — N390 Urinary tract infection, site not specified: Secondary | ICD-10-CM | POA: Diagnosis not present

## 2019-08-26 DIAGNOSIS — Z20828 Contact with and (suspected) exposure to other viral communicable diseases: Secondary | ICD-10-CM | POA: Diagnosis present

## 2019-08-26 DIAGNOSIS — I2602 Saddle embolus of pulmonary artery with acute cor pulmonale: Secondary | ICD-10-CM | POA: Diagnosis present

## 2019-08-26 DIAGNOSIS — R0902 Hypoxemia: Secondary | ICD-10-CM | POA: Diagnosis present

## 2019-08-26 DIAGNOSIS — Z7984 Long term (current) use of oral hypoglycemic drugs: Secondary | ICD-10-CM | POA: Diagnosis not present

## 2019-08-26 DIAGNOSIS — B952 Enterococcus as the cause of diseases classified elsewhere: Secondary | ICD-10-CM | POA: Diagnosis present

## 2019-08-26 DIAGNOSIS — E119 Type 2 diabetes mellitus without complications: Secondary | ICD-10-CM | POA: Diagnosis present

## 2019-08-26 DIAGNOSIS — T83511A Infection and inflammatory reaction due to indwelling urethral catheter, initial encounter: Secondary | ICD-10-CM | POA: Diagnosis present

## 2019-08-26 DIAGNOSIS — T83511S Infection and inflammatory reaction due to indwelling urethral catheter, sequela: Secondary | ICD-10-CM | POA: Diagnosis not present

## 2019-08-26 DIAGNOSIS — C8330 Diffuse large B-cell lymphoma, unspecified site: Secondary | ICD-10-CM | POA: Diagnosis present

## 2019-08-26 DIAGNOSIS — R509 Fever, unspecified: Secondary | ICD-10-CM | POA: Diagnosis present

## 2019-08-26 HISTORY — DX: Malignant (primary) neoplasm, unspecified: C80.1

## 2019-08-26 LAB — URINALYSIS, ROUTINE W REFLEX MICROSCOPIC
Bilirubin Urine: NEGATIVE
Glucose, UA: >=500 mg/dL — AB
Ketones, ur: NEGATIVE mg/dL
Nitrite: NEGATIVE
Protein, ur: 100 mg/dL — AB
RBC / HPF: >50 RBC/hpf — ABNORMAL HIGH (ref 0–5)
Specific Gravity, Urine: 1.006 (ref 1.005–1.030)
pH: 7 (ref 5.0–8.0)

## 2019-08-26 LAB — COMPREHENSIVE METABOLIC PANEL
ALT: 19 U/L (ref 0–44)
AST: 23 U/L (ref 15–41)
Albumin: 2.3 g/dL — ABNORMAL LOW (ref 3.5–5.0)
Alkaline Phosphatase: 60 U/L (ref 38–126)
Anion gap: 7 (ref 5–15)
BUN: 31 mg/dL — ABNORMAL HIGH (ref 8–23)
CO2: 21 mmol/L — ABNORMAL LOW (ref 22–32)
Calcium: 8.3 mg/dL — ABNORMAL LOW (ref 8.9–10.3)
Chloride: 102 mmol/L (ref 98–111)
Creatinine, Ser: 1.19 mg/dL (ref 0.61–1.24)
GFR calc Af Amer: >60 mL/min (ref >60)
GFR calc non Af Amer: 59 mL/min — ABNORMAL LOW (ref >60)
Glucose, Bld: 176 mg/dL — ABNORMAL HIGH (ref 70–99)
Potassium: 3.5 mmol/L (ref 3.5–5.1)
Sodium: 130 mmol/L — ABNORMAL LOW (ref 135–145)
Total Bilirubin: 0.4 mg/dL (ref 0.3–1.2)
Total Protein: 5.7 g/dL — ABNORMAL LOW (ref 6.5–8.1)

## 2019-08-26 LAB — CBC WITH DIFFERENTIAL/PLATELET
Abs Immature Granulocytes: 0 10*3/uL (ref 0.00–0.07)
Basophils Absolute: 0 10*3/uL (ref 0.0–0.1)
Basophils Relative: 0 %
Eosinophils Absolute: 0 10*3/uL (ref 0.0–0.5)
Eosinophils Relative: 0 %
HCT: 21.3 % — ABNORMAL LOW (ref 39.0–52.0)
Hemoglobin: 6.6 g/dL — CL (ref 13.0–17.0)
Immature Granulocytes: 0 %
Lymphocytes Relative: 84 %
Lymphs Abs: 0.2 10*3/uL — ABNORMAL LOW (ref 0.7–4.0)
MCH: 31.6 pg (ref 26.0–34.0)
MCHC: 31 g/dL (ref 30.0–36.0)
MCV: 101.9 fL — ABNORMAL HIGH (ref 80.0–100.0)
Monocytes Absolute: 0 10*3/uL — ABNORMAL LOW (ref 0.1–1.0)
Monocytes Relative: 12 %
Neutro Abs: 0 10*3/uL — ABNORMAL LOW (ref 1.7–7.7)
Neutrophils Relative %: 4 %
Platelets: 143 10*3/uL — ABNORMAL LOW (ref 150–400)
RBC: 2.09 MIL/uL — ABNORMAL LOW (ref 4.22–5.81)
RDW: 14.4 % (ref 11.5–15.5)
WBC: 0.3 10*3/uL — CL (ref 4.0–10.5)
nRBC: 0 % (ref 0.0–0.2)

## 2019-08-26 LAB — SARS CORONAVIRUS 2 (TAT 6-24 HRS): SARS Coronavirus 2: NEGATIVE

## 2019-08-26 LAB — D-DIMER, QUANTITATIVE: D-Dimer, Quant: 3.44 ug/mL-FEU — ABNORMAL HIGH (ref 0.00–0.50)

## 2019-08-26 LAB — ABO/RH: ABO/RH(D): A POS

## 2019-08-26 LAB — LACTIC ACID, PLASMA
Lactic Acid, Venous: 1.3 mmol/L (ref 0.5–1.9)
Lactic Acid, Venous: 0.9 mmol/L (ref 0.5–1.9)

## 2019-08-26 LAB — PREPARE RBC (CROSSMATCH)

## 2019-08-26 LAB — PROTIME-INR
INR: 1.4 — ABNORMAL HIGH (ref 0.8–1.2)
Prothrombin Time: 16.6 seconds — ABNORMAL HIGH (ref 11.4–15.2)

## 2019-08-26 LAB — APTT: aPTT: 29 seconds (ref 24–36)

## 2019-08-26 LAB — MRSA PCR SCREENING: MRSA by PCR: NEGATIVE

## 2019-08-26 MED ORDER — SODIUM CHLORIDE 0.9 % IV SOLN
2.0000 g | Freq: Once | INTRAVENOUS | Status: AC
  Administered 2019-08-26: 2 g via INTRAVENOUS
  Filled 2019-08-26: qty 2

## 2019-08-26 MED ORDER — PROPRANOLOL HCL 20 MG PO TABS
40.0000 mg | ORAL_TABLET | Freq: Two times a day (BID) | ORAL | Status: DC
  Administered 2019-08-27 – 2019-08-30 (×8): 40 mg via ORAL
  Filled 2019-08-26 (×5): qty 2
  Filled 2019-08-26: qty 4
  Filled 2019-08-26 (×4): qty 2

## 2019-08-26 MED ORDER — SODIUM CHLORIDE 0.9 % IV SOLN
2.0000 g | INTRAVENOUS | Status: DC
  Administered 2019-08-27: 2 g via INTRAVENOUS
  Filled 2019-08-26: qty 2

## 2019-08-26 MED ORDER — HEPARIN (PORCINE) 25000 UT/250ML-% IV SOLN
1000.0000 [IU]/h | INTRAVENOUS | Status: DC
  Administered 2019-08-26: 1000 [IU]/h via INTRAVENOUS
  Filled 2019-08-26: qty 250

## 2019-08-26 MED ORDER — CITALOPRAM HYDROBROMIDE 20 MG PO TABS
20.0000 mg | ORAL_TABLET | Freq: Every day | ORAL | Status: DC
  Administered 2019-08-27 – 2019-08-30 (×5): 20 mg via ORAL
  Filled 2019-08-26 (×8): qty 1

## 2019-08-26 MED ORDER — ALLOPURINOL 300 MG PO TABS
300.0000 mg | ORAL_TABLET | Freq: Every day | ORAL | Status: DC
  Administered 2019-08-27 – 2019-08-31 (×5): 300 mg via ORAL
  Filled 2019-08-26 (×9): qty 1

## 2019-08-26 MED ORDER — TEMAZEPAM 15 MG PO CAPS
15.0000 mg | ORAL_CAPSULE | Freq: Every evening | ORAL | Status: DC | PRN
  Administered 2019-08-28 – 2019-08-30 (×3): 15 mg via ORAL
  Filled 2019-08-26 (×3): qty 1

## 2019-08-26 MED ORDER — SODIUM CHLORIDE 0.9 % IV BOLUS
1000.0000 mL | Freq: Once | INTRAVENOUS | Status: AC
  Administered 2019-08-26: 1000 mL via INTRAVENOUS

## 2019-08-26 MED ORDER — PRIMIDONE 50 MG PO TABS
75.0000 mg | ORAL_TABLET | Freq: Three times a day (TID) | ORAL | Status: DC
  Administered 2019-08-27 – 2019-08-31 (×14): 75 mg via ORAL
  Filled 2019-08-26: qty 2
  Filled 2019-08-26 (×7): qty 1.5
  Filled 2019-08-26: qty 2
  Filled 2019-08-26 (×4): qty 1.5
  Filled 2019-08-26: qty 2
  Filled 2019-08-26 (×2): qty 1.5
  Filled 2019-08-26 (×3): qty 2
  Filled 2019-08-26: qty 1.5
  Filled 2019-08-26: qty 2
  Filled 2019-08-26 (×3): qty 1.5
  Filled 2019-08-26: qty 2
  Filled 2019-08-26 (×2): qty 1.5

## 2019-08-26 MED ORDER — TOPIRAMATE 100 MG PO TABS
200.0000 mg | ORAL_TABLET | Freq: Every day | ORAL | Status: DC
  Administered 2019-08-27 – 2019-08-30 (×5): 200 mg via ORAL
  Filled 2019-08-26 (×4): qty 2
  Filled 2019-08-26: qty 8

## 2019-08-26 MED ORDER — LISINOPRIL 10 MG PO TABS
20.0000 mg | ORAL_TABLET | Freq: Every day | ORAL | Status: DC
  Administered 2019-08-27 – 2019-08-28 (×2): 20 mg via ORAL
  Filled 2019-08-26 (×2): qty 2

## 2019-08-26 MED ORDER — ACETAMINOPHEN 325 MG PO TABS
650.0000 mg | ORAL_TABLET | Freq: Once | ORAL | Status: AC
  Administered 2019-08-26: 650 mg via ORAL
  Filled 2019-08-26: qty 2

## 2019-08-26 MED ORDER — PANTOPRAZOLE SODIUM 40 MG IV SOLR: 40.0000 mg | INTRAVENOUS | Status: DC

## 2019-08-26 MED ORDER — METRONIDAZOLE IN NACL 5-0.79 MG/ML-% IV SOLN
500.0000 mg | Freq: Three times a day (TID) | INTRAVENOUS | Status: DC
  Administered 2019-08-26 – 2019-08-28 (×5): 500 mg via INTRAVENOUS
  Filled 2019-08-26 (×5): qty 100

## 2019-08-26 MED ORDER — POTASSIUM CHLORIDE IN NACL 20-0.9 MEQ/L-% IV SOLN
INTRAVENOUS | Status: DC
  Administered 2019-08-27: via INTRAVENOUS
  Filled 2019-08-26: qty 1000

## 2019-08-26 MED ORDER — LABETALOL HCL 5 MG/ML IV SOLN: 10.0000 mg | INTRAVENOUS | Status: DC | PRN

## 2019-08-26 MED ORDER — FOLIC ACID 1 MG PO TABS
1.0000 mg | ORAL_TABLET | Freq: Every day | ORAL | Status: DC
  Administered 2019-08-27 – 2019-08-31 (×5): 1 mg via ORAL
  Filled 2019-08-26 (×6): qty 1

## 2019-08-26 MED ORDER — IOHEXOL 350 MG/ML SOLN
100.0000 mL | Freq: Once | INTRAVENOUS | Status: AC | PRN
  Administered 2019-08-26: 100 mL via INTRAVENOUS

## 2019-08-26 MED ORDER — FLUCONAZOLE IN SODIUM CHLORIDE 400-0.9 MG/200ML-% IV SOLN
400.0000 mg | INTRAVENOUS | Status: DC
  Administered 2019-08-26 – 2019-08-27 (×2): 400 mg via INTRAVENOUS
  Filled 2019-08-26 (×2): qty 200

## 2019-08-26 MED ORDER — PANTOPRAZOLE SODIUM 40 MG IV SOLR
40.0000 mg | INTRAVENOUS | Status: DC
  Administered 2019-08-27 – 2019-08-30 (×5): 40 mg via INTRAVENOUS
  Filled 2019-08-26 (×4): qty 40

## 2019-08-26 MED ORDER — METRONIDAZOLE IN NACL 5-0.79 MG/ML-% IV SOLN: 500.0000 mg | Freq: Three times a day (TID) | INTRAVENOUS | Status: DC

## 2019-08-26 MED ORDER — SODIUM CHLORIDE 0.9% IV SOLUTION
Freq: Once | INTRAVENOUS | Status: AC
  Administered 2019-08-26: 20:00:00 via INTRAVENOUS

## 2019-08-26 MED ORDER — FLUCONAZOLE IN SODIUM CHLORIDE 200-0.9 MG/100ML-% IV SOLN
INTRAVENOUS | Status: AC
  Filled 2019-08-26: qty 200

## 2019-08-26 MED ORDER — ACETAMINOPHEN 500 MG PO TABS
1000.0000 mg | ORAL_TABLET | Freq: Once | ORAL | Status: AC
  Administered 2019-08-26: 1000 mg via ORAL
  Filled 2019-08-26: qty 2

## 2019-08-26 MED ORDER — CALCITRIOL 0.25 MCG PO CAPS
0.5000 ug | ORAL_CAPSULE | Freq: Every day | ORAL | Status: DC
  Administered 2019-08-27 – 2019-08-31 (×5): 0.5 ug via ORAL
  Filled 2019-08-26: qty 1
  Filled 2019-08-26 (×2): qty 2
  Filled 2019-08-26 (×4): qty 1
  Filled 2019-08-26: qty 2
  Filled 2019-08-26: qty 1

## 2019-08-26 NOTE — ED Notes (Signed)
Date and time results received: 08/26/19 1407  Test: WBC  Critical Value: 0.3  Name of Provider Notified: [place], [place]   Orders Received? Or Actions Taken?: See chart

## 2019-08-26 NOTE — Telephone Encounter (Signed)
Called pt's daughter [name] to let her know her father has a UTI per provider and Bactrim antibiotics  was sent to [facility] for pt to pick up. Provider also wanted to let the daughter know that the antibiotics may change once culture is complete in 48 hours. Provider stated the daughter will be made aware of change if needed. Pt's daughter verbalized understanding.

## 2019-08-26 NOTE — Progress Notes (Addendum)
Pharmacy Antibiotic Note  Daniel Richards is a 73 y.o. male admitted on 08/26/2019 with UTI.  Pharmacy has been consulted for  cefepime and fluconazole dosing.  Plan: Start fluconazole 400mg  IV q24h Start cefepime 2g IV q24h Pharmacy will monitor labs, cultures and patient progress.  Height: 5\' 11"  (180.3 cm) Weight: 180 lb (81.6 kg) IBW/kg (Calculated) : 75.3  Temp (24hrs), Avg:100.7 F (38.2 C), Min:100 F (37.8 C), Max:101.3 F (38.5 C)  Recent Labs  Lab 08/20/19 0742 08/21/19 0816 08/25/19 0947  WBC 3.0* 3.0* 0.8*  CREATININE 1.29* 1.17 1.20    Estimated Creatinine Clearance: 56.6 mL/min (by C-G formula based on SCr of 1.2 mg/dL).    Allergies  Allergen Reactions  . Vancomycin Hives    Patient received Benadryl to treat the hives    Antimicrobials this admission: cefepime 10/20>>   fluconazole 10/20>> metronidazole 10/20   Microbiology results: 10/20 [facility] x2:   10/19  UCx:    Thank you for allowing pharmacy to be a part of this patient's care.  Despina Pole, Pharm. D. Clinical Pharmacist 08/26/2019 6:00 PM

## 2019-08-26 NOTE — ED Notes (Signed)
Pt's temp went from 97.8 to 99.2 15 mins after starting the blood; Dr. Kathrynn Humble informed of pt's condition and told to continue blood at 146ml/hr and re-check temp at 9pm; pt denies any other symptoms

## 2019-08-26 NOTE — Progress Notes (Signed)
ANTICOAGULATION CONSULT NOTE - Initial Consult  Pharmacy Consult for Heparin Indication: pulmonary embolus  Allergies  Allergen Reactions  . Vancomycin Hives    Patient received Benadryl to treat the hives    Patient Measurements: Height: 5\' 11"  (180.3 cm) Weight: 180 lb (81.6 kg) IBW/kg (Calculated) : 75.3 Heparin Dosing Weight: total body weight  Vital Signs: Temp: 98.3 F (36.8 C) (10/20 1830) Temp Source: Oral (10/20 1830) BP: 133/71 (10/20 1830) Pulse Rate: 82 (10/20 1830)  Labs: Recent Labs    08/25/19 0947 08/26/19 1330  HGB 8.6* 6.6*  HCT 28.0* 21.3*  PLT 276 143*  APTT  --  29  LABPROT  --  16.6*  INR  --  1.4*  CREATININE 1.20 1.19    Estimated Creatinine Clearance: 57.1 mL/min (by C-G formula based on SCr of 1.19 mg/dL).   Medical History: Past Medical History:  Diagnosis Date  . Anxiety and depression   . Cancer ([place])    lymphoma  . Diabetes mellitus 06/09/2011   pt taking metformin  . DJD of shoulder    right   . Hyperlipidemia   . Hypertension   . Migraines   . Port-A-Cath in place 08/13/2019  . Tremor   . Vitamin D deficiency     Medications:  Scheduled:  . sodium chloride   Intravenous Once  . pantoprazole (PROTONIX) IV  40 mg Intravenous Q24H   Infusions:  . [START ON 08/27/2019] ceFEPime (MAXIPIME) IV    . fluconazole (DIFLUCAN) IV    . metronidazole 500 mg (08/26/19 1828)   PRN:   Assessment: 73 yo male with B-cell lymphoma on chemotherapy presents with febrile neutropenia. Noted to be hypoxic with elevated d-dimer. CTa shows acute bilateral PE, including small saddle embolus with evidence of right heart strain.  Pharmacy consulted to dose IV heparin. Hgb noted 6.6 which is decreased from 8.6 yesterday, MD aware and 1 unit PRBC ordered.  Goal of Therapy:  Heparin level 0.3-0.7 units/ml Monitor platelets by anticoagulation protocol: Yes   Plan:   No heparin bolus  Heparin IV infusion 1000 units/hr (~12  units/kg/hr)  Check heparin level in 8hrs  Daily heparin level and CBC  Monitor closely for signs/symptoms of bleeding  Peggyann Juba, PharmD, BCPS 08/26/2019,7:11 PM

## 2019-08-26 NOTE — H&P (Addendum)
History and Physical    Daniel Richards [id number] DOB: [birth year] DOA: 08/26/2019  PCP: [name], FNP   Patient coming from: Home  I have personally briefly reviewed patient's old medical records in [place]  Chief Complaint: fever  HPI: Daniel Richards is a 73 y.o. 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  Patient was brought to the ED via EMS with reports a fever up to 104 recorded by EMS.  Daughter reported over the past week patient has been feeling unwell.  He has a chronic indwelling Foley catheter.  Daughter thinks they might have been blood in patient's urine yesterday, patient confirms this, that this was about 3 days ago, small quantity and was a one-time episode.  He reports pain with Foley catheter, and his pain has improved since his catheter was changed today.  Reports he has had chronic indwelling Foley catheter for about 2 months now for inability to void. Patient denies difficulty breathing or cough, no chest pain, vomiting or loose stools no abdominal pain. Patient was seen by his oncologist yesterday, WBC was 0.8, was told to come to the ED if he had fevers.  Was prescribed Bactrim which also started today for urinary tract infection.  On EMS arrival today patient's O2 sats were 88% on room air, improved on 2 L O2.  He denies Covid positive contacts.  Recent hospitalization 10/9-10/11 for malignancy related hypercalcemia.   ED Course: On arrival in the ED, patient was extremely weak, pale.  T-max 101.3.  Blood pressure 99-131.  WBC 0.3.  Hemoglobin 6.6.  Normal lactic acid x 2.  UA moderate leukocytes, 11-20 WBCs rare bacteria.  Elevated D-dimer 3.44.  Portable chest x-ray negative for acute abnormality.  CTA chest ordered and pending.  Patient was started on IV cefepime, 1 L bolus given.  With vancomycin allergy, vancomycin was discontinued.  MRSA swab in ED negative.  COVID-19 pending.   Review of Systems: As per HPI all other systems reviewed and negative.  Past Medical History:  Diagnosis Date  . Anxiety and depression   . Cancer ([place])    lymphoma  . Diabetes mellitus 06/09/2011   pt taking metformin  . DJD of shoulder    right   . Hyperlipidemia   . Hypertension   . Migraines   . Port-A-Cath in place 08/13/2019  . Tremor   . Vitamin D deficiency     Past Surgical History:  Procedure Laterality Date  . BACK SURGERY    . KNEE SURGERY    . PORTACATH PLACEMENT Left 08/08/2019   Procedure: INSERTION PORT-A-CATH;  Surgeon: Aviva Signs, MD;  Location: [facility];  Service: General;  Laterality: Left;  . SHOULDER SURGERY       reports that he has never smoked. He has never used smokeless tobacco. He reports that he does not drink alcohol or use drugs.  Allergies  Allergen Reactions  . Vancomycin Hives    Patient received Benadryl to treat the hives    Family History  Problem Relation Age of Onset  . Cancer Sister   . Cancer Sister   . Cancer Brother   . Heart attack Father   . Cancer Brother   . Heart attack Brother   . Healthy Son   . Healthy Daughter   . Migraines Neg Hx     Prior to Admission medications   Medication Sig Start Date End Date Taking? Authorizing Provider  acetaminophen (  TYLENOL) 325 MG tablet Take 2 tablets (650 mg total) by mouth every 6 (six) hours as needed for mild pain, fever or headache. 08/17/19  Yes Imran Nuon, Courage, MD  allopurinol (ZYLOPRIM) 300 MG tablet Take 1 tablet (300 mg total) by mouth daily. 08/11/19  Yes Derek Jack, MD  calcitRIOL (ROCALTROL) 0.5 MCG capsule Take 1 capsule (0.5 mcg total) by mouth daily. 08/06/19  Yes Gerlene Fee, NP  citalopram (CELEXA) 20 MG tablet Take 1 tablet (20 mg total) by mouth at bedtime. 08/06/19  Yes Gerlene Fee, NP  CYCLOPHOSPHAMIDE IV Inject into the vein every 21 ( twenty-one) days. 08/20/19  Yes [provider]  DOXORUBICIN HCL IV Inject into the vein every  21 ( twenty-one) days. 08/20/19  Yes [provider]  folic acid (FOLVITE) 1 MG tablet Take 1 tablet (1 mg total) by mouth daily. 08/06/19  Yes Gerlene Fee, NP  gabapentin (NEURONTIN) 300 MG capsule Take 2 capsules (600 mg total) by mouth 3 (three) times daily. 08/06/19  Yes Gerlene Fee, NP  lidocaine-prilocaine (EMLA) cream Apply a small amount to port a cath site and cover with plastic wrap 1 hour prior to chemotherapy appointments 08/13/19  Yes Derek Jack, MD  lisinopril (ZESTRIL) 20 MG tablet Take 20 mg by mouth daily.  08/18/19  Yes [provider]  metFORMIN (GLUCOPHAGE) 500 MG tablet Take 1 tablet (500 mg total) by mouth daily. 08/06/19  Yes Gerlene Fee, NP  NON FORMULARY Diet: Regular, NAS, Consistent Carbohydrate   Yes [provider]  ondansetron (ZOFRAN) 4 MG tablet Take 1 tablet (4 mg total) by mouth every 6 (six) hours as needed for nausea. 08/17/19  Yes Mohamadou Maciver, Courage, MD  pantoprazole (PROTONIX) 40 MG tablet Take 1 tablet (40 mg total) by mouth 2 (two) times daily before a meal. 08/06/19  Yes Gerlene Fee, NP  polyethylene glycol (MIRALAX / GLYCOLAX) 17 g packet Take 17 g by mouth daily as needed for mild constipation. 08/17/19  Yes Ryun Velez, Courage, MD  predniSONE (DELTASONE) 20 MG tablet Take 3 tablets (60 mg total) by mouth daily. Take on days 1-5 of chemotherapy. Patient taking differently: Take 60 mg by mouth as directed. Take on days 1-5 of chemotherapy. 08/13/19  Yes Derek Jack, MD  primidone (MYSOLINE) 50 MG tablet Take 1.5 tablets (75 mg total) by mouth every 8 (eight) hours. 08/06/19  Yes Gerlene Fee, NP  prochlorperazine (COMPAZINE) 10 MG tablet Take 1 tablet (10 mg total) by mouth every 6 (six) hours as needed (Nausea or vomiting). 08/13/19  Yes Derek Jack, MD  propranolol (INDERAL) 40 MG tablet Take 1 tablet (40 mg total) by mouth 2 (two) times daily. 08/06/19  Yes Gerlene Fee, NP  riTUXimab in  sodium chloride 0.9 % 250 mL Inject into the vein every 21 ( twenty-one) days. 08/20/19  Yes [provider]  sucralfate (CARAFATE) 1 GM/10ML suspension Take 10 mLs (1 g total) by mouth 4 (four) times daily -  with meals and at bedtime. 08/06/19  Yes Gerlene Fee, NP  temazepam (RESTORIL) 15 MG capsule Take 1 capsule (15 mg total) by mouth at bedtime as needed for sleep. 08/21/19  Yes Derek Jack, MD  topiramate (TOPAMAX) 100 MG tablet Take 2 tablets (200 mg total) by mouth at bedtime. 08/06/19  Yes Gerlene Fee, NP  vinCRIStine 2 mg in sodium chloride 0.9 % 50 mL Inject 2 mg into the vein every 21 ( twenty-one) days. 08/20/19  Yes [provider]  sulfamethoxazole-trimethoprim (BACTRIM DS) 800-160 MG tablet Take 1 tablet by mouth 2 (two) times daily. Patient not taking: Reported on 08/26/2019 08/25/19   Roger Shelter, FNP    Physical Exam: Vitals:   08/26/19 1500 08/26/19 1515 08/26/19 1530 08/26/19 1600  BP: 130/68  126/62 (!) 120/59  Pulse: 82 82 78 81  Resp: 20 19 15 12   Temp:      TempSrc:      SpO2: 95% 98% 99% 100%  Weight:      Height:        Constitutional: NAD, calm, comfortable Vitals:   08/26/19 1500 08/26/19 1515 08/26/19 1530 08/26/19 1600  BP: 130/68  126/62 (!) 120/59  Pulse: 82 82 78 81  Resp: 20 19 15 12   Temp:      TempSrc:      SpO2: 95% 98% 99% 100%  Weight:      Height:       Eyes: PERRL, lids and conjunctivae normal ENMT: Mucous membranes are moist. Posterior pharynx clear of any exudate or lesions.  Neck: normal, supple, no masses, no thyromegaly Respiratory: clear to auscultation bilaterally, no wheezing, no crackles. Normal respiratory effort. No accessory muscle use.  Cardiovascular: Regular rate and rhythm, no murmurs / rubs / gallops. No extremity edema. 2+ pedal pulses.  Abdomen: no tenderness, no masses palpated. No hepatosplenomegaly. Bowel sounds positive.  Musculoskeletal: no clubbing / cyanosis. No joint  deformity upper and lower extremities. Good ROM, no contractures. Normal muscle tone.  Skin: no rashes, lesions, ulcers. No induration Neurologic: Generalized tremors, present with intention and at rest.  Chronic.  CN 2-12 grossly intact.  Strength 5/5 in all 4.  Psychiatric: Normal judgment and insight. Alert and oriented x 3. Normal mood.   Labs on Admission: I have personally reviewed following labs and imaging studies  CBC: Recent Labs  Lab 08/20/19 0742 08/21/19 0816 08/25/19 0947 08/26/19 1330  WBC 3.0* 3.0* 0.8* 0.3*  NEUTROABS 0.9* 1.3* 0.1* 0.0*  HGB 9.1* 8.4* 8.6* 6.6*  HCT 29.3* 27.2* 28.0* 21.3*  MCV 102.4* 103.0* 103.3* 101.9*  PLT 349 303 276 [id number]*   Basic Metabolic Panel: Recent Labs  Lab 08/20/19 0742 08/21/19 0816 08/25/19 0947 08/26/19 1330  NA 134* 136 137 130*  K 3.7 3.3* 4.0 3.5  CL 103 104 103 102  CO2 21* 22 24 21*  GLUCOSE 198* 189* 157* 176*  BUN 21 20 39* 31*  CREATININE 1.29* 1.17 1.20 1.19  CALCIUM 11.8* 11.2* 9.6 8.3*  MG 1.9 1.8 2.1  --   PHOS 1.9* 2.0* 3.0  --    GFR: Estimated Creatinine Clearance: 57.1 mL/min (by C-G formula based on SCr of 1.19 mg/dL). Liver Function Tests: Recent Labs  Lab 08/20/19 0742 08/21/19 0816 08/25/19 0947 08/26/19 1330  AST 22 25 20 23   ALT 18 17 18 19   ALKPHOS 65 67 63 60  BILITOT 0.3 0.4 0.5 0.4  PROT 7.0 6.5 6.6 5.7*  ALBUMIN 2.7* 2.6* 2.7* 2.3*   Coagulation Profile: Recent Labs  Lab 08/26/19 1330  INR 1.4*   Urine analysis:    Component Value Date/Time   COLORURINE YELLOW 08/26/2019 1330   APPEARANCEUR CLOUDY (A) 08/26/2019 1330   LABSPEC 1.006 08/26/2019 1330   PHURINE 7.0 08/26/2019 1330   GLUCOSEU >=500 (A) 08/26/2019 1330   HGBUR LARGE (A) 08/26/2019 1330   BILIRUBINUR NEGATIVE 08/26/2019 1330   KETONESUR NEGATIVE 08/26/2019 1330   PROTEINUR 100 (A) 08/26/2019 1330  UROBILINOGEN 1.0 11/18/2014 1238   NITRITE NEGATIVE 08/26/2019 1330   LEUKOCYTESUR MODERATE (A) 08/26/2019  1330    Radiological Exams on Admission: Dg Chest Port 1 View  Result Date: 08/26/2019 CLINICAL DATA:  Hypoxia EXAM: PORTABLE CHEST 1 VIEW COMPARISON:  08/15/2019 FINDINGS: Left chest wall port is again noted. Cardiac shadow is stable. The lungs are well aerated bilaterally. No bony abnormality is noted. IMPRESSION: No active disease. Electronically Signed   By: Inez Catalina M.D.   On: 08/26/2019 13:59    EKG: Pending.   Assessment/Plan Active Problems:   Febrile neutropenia (HCC)  Febrile neutropenia-temperature 101.3.  WBC markedly low at 0.3.  Vitals otherwise stable currently on chemotherapy for B-cell lymphoma.  Per oncology notes, 10/14, leukopenia likely from lymphoma involvement of the bone marrow.  Normal lactic acid.  Likely etiology for fever urinary source considering chronic indwelling Foley catheter. Reports Foley changed today.  UA with moderate leukocytes 11-20 WBCs rare bacteria.  Portable chest x-ray negative for acute abnormality. -Continue IV cefepime -Will add IV metronidazole and fluconazole -Monitor bolus normal saline given, continue N/s + 40 KCL 75cc/hr x 12 hrs -Follow-up blood and urine cultures -Oncology consult in a.m. -BMP, CBC a.m. Addendum- while transfusing blood temp rose to 100.8. Tylenol given. I evaluated patient, his cheeks appeared flushed, but there was no obvious rash on face or body.  No itching.  No difficulty breathing, no back pain.  Appears to have had a febrile transfusion reaction.  Blood transfusion will be resumed.  Bilateral PE with saddle embolus-O2 sats 88% on room air.  Improved with 2 L nasal cannula.  D-dimer elevated 3.44.  With active malignancy at risk for pulmonary embolism.  Subsequent CTA chest shows submassive PE with right heart strain. -IV heparin pharmacy to dose,(will not bolus with acute anemia and reported hematuria) -CBC closely -Supplemental O2  Acute on chronic anemia-hemoglobin 6.6, baseline over the past month  8-9.  Iron studies 07/2019, shows serum iron of low at 38, low TIBC, mildly elevated ferritin 373. No obvious GI blood loss hx, daughter reports ?blood in urine. - Transfuse 1 unit PRBC - CBC a.m -IV Protonix 40 daily  B-cell lymphoma-currently on chemotherapy.  Follows with Dr. Delton Coombes.  Received filgrastium for leukopenia 08/22/2019.  Low folate at 5. -Oncology consult  Diabetes mellitus type 2-random glucose 176.  Hemoglobin A1c 9/17  5.6. ??  Tight control. - SSI -Hold home Metformin  BPH-chronic indwelling Foley catheter.  Generalized tremors -Resume home Mysoline, Inderal, topiramate  Hypertension-elevated -Resume home lisinopril, propranolol -As needed labetalol   DVT prophylaxis: Lovenox Code Status: Full code Family Communication: Patient son at bedside later in the evening, updated. Disposition Plan: > 2 days Consults called: Oncology Admission status: Inpatient, stepdown I certify that at the point of admission it is my clinical judgment that the patient will require inpatient hospital care spanning beyond 2 midnights from the point of admission due to high intensity of service, high risk for further deterioration and high frequency of surveillance required. The following factors support the patient status of inpatient: Febrile neutropenia, requiring IV antibiotics pending cultures.   Bethena Roys MD [facility]  08/26/2019, 10:08 PM

## 2019-08-26 NOTE — ED Notes (Signed)
Dr. [name] paged due to suspected blood reaction. Blood stopped- see flowsheets  Lab notified.

## 2019-08-26 NOTE — ED Provider Notes (Signed)
Medical screening examination/treatment/procedure(s) were conducted as a shared visit with non-physician practitioner(s) and myself.  I personally evaluated the patient during the encounter.  Clinical Impression:   Final diagnoses:  Febrile neutropenia ([place])  Acute saddle pulmonary embolism with acute cor pulmonale (HCC)  Urinary tract infection associated with indwelling urethral catheter, initial encounter [facility])    This patient is a 73 year old male who presents to the hospital with a fever from home, reports that he has had some fever and has felt some weakness today.  They went to change his Foley catheter at home when they noticed him to be febrile and brought him into the hospital.  On my exam the patient is extremely weak, he is pale, his conjunctive are pale, his abdomen is mildly distended nontender but symptomatic, no edema of the legs, pulses are palpable to radial arteries, he has fever to 101.3 with a blood pressure of 108/48.  His labs show a significant drop in his hemoglobin down to 6.6 approximately 2 g, he has a white blood cell count and a neutropenia.  I suspect the patient would need to be admitted to the hospital, will treat for febrile neutropenia, could be related to bacteremia, sepsis or some other source including a urinary source given his Foley catheter.  This will be checked, cultures will be sent, the patient is critically ill with what appears to be sepsis from a febrile neutropenia.  I agree with the physician assistant documentation.  I have looked at the chest x-ray and find no signs of infiltrate.  .Critical Care Performed by: Noemi Chapel, MD Authorized by: Noemi Chapel, MD   Critical care provider statement:    Critical care time (minutes):  35   Critical care time was exclusive of:  Separately billable procedures and treating other patients and teaching time   Critical care was necessary to treat or prevent imminent or life-threatening deterioration of  the following conditions:  Sepsis   Critical care was time spent personally by me on the following activities:  Blood draw for specimens, development of treatment plan with patient or surrogate, discussions with consultants, evaluation of patient's response to treatment, examination of patient, obtaining history from patient or surrogate, ordering and performing treatments and interventions, ordering and review of laboratory studies, ordering and review of radiographic studies, pulse oximetry, re-evaluation of patient's condition and review of old charts Comments:            Noemi Chapel, MD 08/27/19 [phone]

## 2019-08-26 NOTE — ED Notes (Signed)
Hemoccult negative.

## 2019-08-26 NOTE — ED Provider Notes (Signed)
[facility] EMERGENCY DEPARTMENT Provider Note   CSN: [id number] Arrival date & time: 08/26/19  1303     History   Chief Complaint Chief Complaint  Patient presents with   Fever    HPI Daniel Richards is a 73 y.o. male with history of B-cell lymphoma, hyperlipidemia, hypertension, migraines, type 2 diabetes mellitus presents for evaluation of acute onset, progressively worsening generalized weakness for 1 week.  Also has noted dysuria and hematuria.  He has an indwelling Foley catheter.  Reports that he had an office visit with his oncologist yesterday and was noted to have a UTI.  A prescription for Bactrim was called into the pharmacy but his daughter was unable to pick it up.  His home health nurse came today to change out his Foley catheter and noted that he was febrile with a temperature of 104 F.  EMS noted the patient to be hypoxic with O2 saturations 88% on room air with improvement on 2 L nasal cannula.  He denies chest pain, shortness of breath, cough, nausea, vomiting.  He does complain of some lower abdominal pain. No known sick contacts. He is full code.      The history is provided by the patient, medical records and the EMS personnel.    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   Port-A-Cath in place 08/13/2019   Anemia of chronic disease 08/07/2019   Major depression, recurrent, chronic ([place]) 08/07/2019   BPH without urinary obstruction 08/07/2019   Aortic  atherosclerosis ([place]) 08/07/2019   Diabetic peripheral neuropathy ([place]) 08/07/2019   Hypertension associated with type 2 diabetes mellitus ([place]) 08/07/2019   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, unspecified B-cell lymphoma type  08/04/2019   Odynophagia    Acute kidney injury ([place])    Falls frequently    Generalized abdominal pain    Goals of care, counseling/discussion    Palliative care by specialist    Folate deficiency    Generalized weakness 07/25/2019   Intra-abdominal lymphadenopathy 07/25/2019   Retroperitoneal lymphadenopathy 07/25/2019   Malnutrition of moderate degree 07/25/2019   Tremor    Hypercalcemia 07/24/2019   B12 deficiency 11/18/2016   Weakness generalized 11/16/2016   Altered mental status, unspecified 11/16/2016   Fracture, rib 11/16/2016   Weakness 11/16/2016   New onset of headaches after age 35 09/01/2015   Morning headache 09/01/2015   Daytime somnolence 09/01/2015   Essential tremor 09/01/2015   Intractable headache 09/01/2015   Type 2 diabetes, controlled, with peripheral neuropathy ([place]) 06/09/2011    Past Surgical History:  Procedure Laterality Date   BACK SURGERY     KNEE SURGERY     PORTACATH PLACEMENT Left 08/08/2019   Procedure: INSERTION PORT-A-CATH;  Surgeon: Aviva Signs, MD;  Location: [facility];  Service: General;  Laterality: Left;   SHOULDER SURGERY          Home Medications  Prior to Admission medications   Medication Sig Start Date End Date Taking? Authorizing Provider  acetaminophen (TYLENOL) 325 MG tablet Take 2 tablets (650 mg total) by mouth every 6 (six) hours as needed for mild pain, fever or headache. 08/17/19  Yes Emokpae, Courage, MD  allopurinol (ZYLOPRIM) 300 MG tablet Take 1 tablet (300 mg total) by mouth daily. 08/11/19  Yes Derek Jack, MD  calcitRIOL (ROCALTROL) 0.5 MCG capsule Take 1 capsule (0.5 mcg total) by mouth daily. 08/06/19  Yes Gerlene Fee, NP  citalopram (CELEXA) 20 MG tablet Take 1 tablet (20 mg total) by mouth at bedtime. 08/06/19  Yes Gerlene Fee, NP  CYCLOPHOSPHAMIDE IV Inject into the vein every 21 ( twenty-one) days. 08/20/19  Yes [provider]  DOXORUBICIN HCL IV Inject into the vein every 21 ( twenty-one) days. 08/20/19  Yes [provider]  folic acid (FOLVITE) 1 MG tablet Take 1 tablet (1 mg total) by mouth daily. 08/06/19  Yes Gerlene Fee, NP  gabapentin (NEURONTIN) 300 MG capsule Take 2 capsules (600 mg total) by mouth 3 (three) times daily. 08/06/19  Yes Gerlene Fee, NP  lidocaine-prilocaine (EMLA) cream Apply a small amount to port a cath site and cover with plastic wrap 1 hour prior to chemotherapy appointments 08/13/19  Yes Derek Jack, MD  lisinopril (ZESTRIL) 20 MG tablet Take 20 mg by mouth daily.  08/18/19  Yes [provider]  metFORMIN (GLUCOPHAGE) 500 MG tablet Take 1 tablet (500 mg total) by mouth daily. 08/06/19  Yes Gerlene Fee, NP  NON FORMULARY Diet: Regular, NAS, Consistent Carbohydrate   Yes [provider]  ondansetron (ZOFRAN) 4 MG tablet Take 1 tablet (4 mg total) by mouth every 6 (six) hours as needed for nausea. 08/17/19  Yes Emokpae, Courage, MD  pantoprazole (PROTONIX) 40 MG tablet Take 1 tablet (40 mg total) by mouth 2 (two) times daily before a meal. 08/06/19  Yes Gerlene Fee, NP  polyethylene glycol (MIRALAX / GLYCOLAX) 17 g packet Take 17 g by mouth daily as needed for mild constipation. 08/17/19  Yes Emokpae, Courage, MD  predniSONE (DELTASONE) 20 MG tablet Take 3 tablets (60 mg total) by mouth daily. Take on days 1-5 of chemotherapy. Patient taking differently: Take 60 mg by mouth as directed. Take on days 1-5 of chemotherapy. 08/13/19  Yes Derek Jack, MD  primidone (MYSOLINE) 50 MG tablet Take 1.5 tablets (75 mg total) by mouth every 8 (eight) hours. 08/06/19  Yes Gerlene Fee, NP  prochlorperazine  (COMPAZINE) 10 MG tablet Take 1 tablet (10 mg total) by mouth every 6 (six) hours as needed (Nausea or vomiting). 08/13/19  Yes Derek Jack, MD  propranolol (INDERAL) 40 MG tablet Take 1 tablet (40 mg total) by mouth 2 (two) times daily. 08/06/19  Yes Gerlene Fee, NP  riTUXimab in sodium chloride 0.9 % 250 mL Inject into the vein every 21 ( twenty-one) days. 08/20/19  Yes [provider]  sucralfate (CARAFATE) 1 GM/10ML suspension Take 10 mLs (1 g total) by mouth 4 (four) times daily -  with meals and at bedtime. 08/06/19  Yes Gerlene Fee, NP  temazepam (RESTORIL) 15 MG capsule Take 1 capsule (15 mg total) by mouth at bedtime as needed for sleep. 08/21/19  Yes Derek Jack, MD  topiramate (TOPAMAX) 100 MG tablet Take 2 tablets (200 mg total) by mouth at bedtime. 08/06/19  Yes Gerlene Fee, NP  vinCRIStine 2 mg in sodium  chloride 0.9 % 50 mL Inject 2 mg into the vein every 21 ( twenty-one) days. 08/20/19  Yes [provider]  sulfamethoxazole-trimethoprim (BACTRIM DS) 800-160 MG tablet Take 1 tablet by mouth 2 (two) times daily. Patient not taking: Reported on 08/26/2019 08/25/19   Roger Shelter, FNP    Family History Family History  Problem Relation Age of Onset   Cancer Sister    Cancer Sister    Cancer Brother    Heart attack Father    Cancer Brother    Heart attack Brother    Healthy Son    Healthy Daughter    Migraines Neg Hx     Social History Social History   Tobacco Use   Smoking status: Never Smoker   Smokeless tobacco: Never Used  Substance Use Topics   Alcohol use: No   Drug use: No     Allergies   Vancomycin   Review of Systems Review of Systems  Constitutional: Positive for chills, fatigue and fever.  Respiratory: Negative for cough and shortness of breath.   Cardiovascular: Negative for chest pain.  Gastrointestinal: Positive for abdominal pain. Negative for blood in stool, diarrhea, nausea and  vomiting.  Genitourinary: Positive for dysuria and hematuria.  Neurological: Positive for weakness (generalized).  All other systems reviewed and are negative.    Physical Exam Updated Vital Signs BP (!) 149/72    Pulse 98    Temp 99.8 F (37.7 C) (Oral)    Resp 18    Ht 5\' 11"  (1.803 m)    Wt 81.6 kg    SpO2 90%    BMI 25.10 kg/m   Physical Exam Vitals signs and nursing note reviewed.  Constitutional:      General: He is not in acute distress.    Appearance: He is well-developed.  HENT:     Head: Normocephalic and atraumatic.  Eyes:     General:        Right eye: No discharge.        Left eye: No discharge.     Conjunctiva/sclera: Conjunctivae normal.  Neck:     Vascular: No JVD.     Trachea: No tracheal deviation.  Cardiovascular:     Rate and Rhythm: Normal rate and regular rhythm.  Pulmonary:     Effort: Pulmonary effort is normal.     Breath sounds: Wheezing present.     Comments: Diffuse expiratory wheezes Abdominal:     General: Abdomen is protuberant. Bowel sounds are decreased. There is no distension.     Palpations: Abdomen is soft.     Tenderness: There is abdominal tenderness in the right lower quadrant, suprapubic area and left lower quadrant. There is no right CVA tenderness or left CVA tenderness.  Genitourinary:    Comments: Examination performed in the presence of a chaperone.  No frank rectal bleeding.  Scant amount of brown stool in the rectal vault does not appear to be obviously melanotic Skin:    General: Skin is warm and dry.     Findings: No erythema.  Neurological:     Mental Status: He is alert.  Psychiatric:        Behavior: Behavior normal.      ED Treatments / Results  Labs (all labs ordered are listed, but only abnormal results are displayed) Labs Reviewed  COMPREHENSIVE METABOLIC PANEL - Abnormal; Notable for the following components:      Result Value   Sodium 130 (*)    CO2 21 (*)  Glucose, Bld 176 (*)    BUN 31 (*)     Calcium 8.3 (*)    Total Protein 5.7 (*)    Albumin 2.3 (*)    GFR calc non Af Amer 59 (*)    All other components within normal limits  CBC WITH DIFFERENTIAL/PLATELET - Abnormal; Notable for the following components:   WBC 0.3 (*)    RBC 2.09 (*)    Hemoglobin 6.6 (*)    HCT 21.3 (*)    MCV 101.9 (*)    Platelets 143 (*)    Neutro Abs 0.0 (*)    Lymphs Abs 0.2 (*)    Monocytes Absolute 0.0 (*)    All other components within normal limits  PROTIME-INR - Abnormal; Notable for the following components:   Prothrombin Time 16.6 (*)    INR 1.4 (*)    All other components within normal limits  URINALYSIS, ROUTINE W REFLEX MICROSCOPIC - Abnormal; Notable for the following components:   APPearance CLOUDY (*)    Glucose, UA >=500 (*)    Hgb urine dipstick LARGE (*)    Protein, ur 100 (*)    Leukocytes,Ua MODERATE (*)    RBC / HPF >50 (*)    Bacteria, UA RARE (*)    Non Squamous Epithelial 0-5 (*)    All other components within normal limits  D-DIMER, QUANTITATIVE (NOT AT [facility]) - Abnormal; Notable for the following components:   D-Dimer, Quant 3.44 (*)    All other components within normal limits  CULTURE, BLOOD (ROUTINE X 2)  CULTURE, BLOOD (ROUTINE X 2)  SARS CORONAVIRUS 2 (TAT 6-24 HRS)  MRSA PCR SCREENING  URINE CULTURE  LACTIC ACID, PLASMA  LACTIC ACID, PLASMA  APTT  HEPARIN LEVEL (UNFRACTIONATED)  CBC  POC OCCULT BLOOD, ED  TYPE AND SCREEN  PREPARE RBC (CROSSMATCH)  ABO/RH  TRANSFUSION REACTION    EKG None  Radiology Ct Angio Chest Pe W And/or Wo Contrast  Result Date: 08/26/2019 CLINICAL DATA:  Fever hypoxia not feeling well EXAM: CT ANGIOGRAPHY CHEST WITH CONTRAST TECHNIQUE: Multidetector CT imaging of the chest was performed using the standard protocol during bolus administration of intravenous contrast. Multiplanar CT image reconstructions and MIPs were obtained to evaluate the vascular anatomy. CONTRAST:  158mL OMNIPAQUE IOHEXOL 350 MG/ML SOLN COMPARISON:   Chest x-ray 08/26/2019, PET CT 08/07/2019, CT chest 07/25/2019 FINDINGS: Cardiovascular: Satisfactory opacification of the pulmonary arteries to the segmental level. Small saddle embolus within the distal left pulmonary artery, with extension of thrombus into left upper and lower lobe segmental and subsegmental vessels. Small nonocclusive thrombi within right upper, right middle, and right lower lobe segmental and subsegmental vessels. RV LV ratio is 1.0. Nonaneurysmal aorta with mild aortic atherosclerosis. No dissection. Normal heart size. Coronary vascular calcifications. No large pericardial effusion Mediastinum/Nodes: Midline trachea. No thyroid mass. Scattered calcifications in the right lobe. Esophagus within normal limits. Similar to decreased size of mediastinal lymph nodes. Periesophageal lymph node measures 1 cm compared with 1.1 cm previously. Lungs/Pleura: Emphysema. No pleural effusion. Small foci of sub pleural density, likely reflecting atelectasis. Upper Abdomen: Incompletely visualized retroperitoneal adenopathy. Musculoskeletal: No acute or suspicious osseous abnormality. Review of the MIP images confirms the above findings. IMPRESSION: 1. Positive for acute bilateral pulmonary emboli, including small saddle embolus in the distal left pulmonary artery. Positive for acute PE with CT evidence of right heart strain (RV/LV Ratio = 1.0) consistent with at least submassive (intermediate risk) PE. The presence of right heart strain has been  associated with an increased risk of morbidity and mortality. Please activate Code PE by paging [phone]. 2. Emphysema 3. Similar appearance of small mediastinal nodes. Incompletely visualized retroperitoneal adenopathy in the abdomen Critical Value/emergent results were called by telephone at the time of interpretation on 08/26/2019 at 6:20 pm to provider Dr. Bethena Roys, who verbally acknowledged these results. Aortic Atherosclerosis (ICD10-I70.0) and  Emphysema (ICD10-J43.9). Electronically Signed   By: Donavan Foil M.D.   On: 08/26/2019 18:21   Dg Chest Port 1 View  Result Date: 08/26/2019 CLINICAL DATA:  Hypoxia EXAM: PORTABLE CHEST 1 VIEW COMPARISON:  08/15/2019 FINDINGS: Left chest wall port is again noted. Cardiac shadow is stable. The lungs are well aerated bilaterally. No bony abnormality is noted. IMPRESSION: No active disease. Electronically Signed   By: Inez Catalina M.D.   On: 08/26/2019 13:59    Procedures .Critical Care Performed by: Renita Papa, PA-C Authorized by: Renita Papa, PA-C   Critical care provider statement:    Critical care time (minutes):  45   Critical care was necessary to treat or prevent imminent or life-threatening deterioration of the following conditions:  Sepsis   Critical care was time spent personally by me on the following activities:  Discussions with consultants, evaluation of patient's response to treatment, examination of patient, ordering and performing treatments and interventions, ordering and review of laboratory studies, ordering and review of radiographic studies, pulse oximetry, re-evaluation of patient's condition, obtaining history from patient or surrogate and review of old charts   (including critical care time)  Medications Ordered in ED Medications  ceFEPIme (MAXIPIME) 2 g in sodium chloride 0.9 % 100 mL IVPB (has no administration in time range)  metroNIDAZOLE (FLAGYL) IVPB 500 mg (0 mg Intravenous Stopped 08/26/19 1930)  fluconazole (DIFLUCAN) IVPB 400 mg (400 mg Intravenous New Bag/Given 08/26/19 2128)  heparin ADULT infusion 100 units/mL (25000 units/240mL sodium chloride 0.45%) (1,000 Units/hr Intravenous New Bag/Given 08/26/19 2002)  0.9 % NaCl with KCl 20 mEq/ L  infusion (has no administration in time range)  allopurinol (ZYLOPRIM) tablet 300 mg (has no administration in time range)  calcitRIOL (ROCALTROL) capsule 0.5 mcg (has no administration in time range)    citalopram (CELEXA) tablet 20 mg (has no administration in time range)  folic acid (FOLVITE) tablet 1 mg (has no administration in time range)  lisinopril (ZESTRIL) tablet 20 mg (has no administration in time range)  primidone (MYSOLINE) tablet 75 mg (has no administration in time range)  propranolol (INDERAL) tablet 40 mg (has no administration in time range)  temazepam (RESTORIL) capsule 15 mg (has no administration in time range)  topiramate (TOPAMAX) tablet 200 mg (has no administration in time range)  pantoprazole (PROTONIX) injection 40 mg (has no administration in time range)  labetalol (NORMODYNE) injection 10 mg (has no administration in time range)  ceFEPIme (MAXIPIME) 2 g in sodium chloride 0.9 % 100 mL IVPB (0 g Intravenous Stopped 08/26/19 1441)  acetaminophen (TYLENOL) tablet 650 mg (650 mg Oral Given 08/26/19 1416)  sodium chloride 0.9 % bolus 1,000 mL (0 mLs Intravenous Stopped 08/26/19 1659)  0.9 %  sodium chloride infusion (Manually program via Guardrails IV Fluids) ( Intravenous Stopped 08/26/19 2030)  iohexol (OMNIPAQUE) 350 MG/ML injection 100 mL (100 mLs Intravenous Contrast Given 08/26/19 1744)  acetaminophen (TYLENOL) tablet 1,000 mg (1,000 mg Oral Given 08/26/19 2126)  fluconazole (DIFLUCAN) 200-0.9 MG/100ML-% IVPB (has no administration in time range)     Initial Impression / Assessment and Plan / ED  Course  I have reviewed the triage vital signs and the nursing notes.  Pertinent labs & imaging results that were available during my care of the patient were reviewed by me and considered in my medical decision making (see chart for details).        Patient presenting for evaluation of fever.  And to be hypoxic with EMS, improved with supplemental oxygen.  He has an indwelling Foley catheter and was found to have a UTI at his oncologist office yesterday but was unable to start antibiotic therapy.  In the ED he is febrile to 101.3 F rectally, vital signs otherwise  stable on supplemental oxygen.  UA concerning for UTI.  His CBC today is concerning with a WBC count of 0.3 down from 0.8 from blood work obtained yesterday and his absolute neutrophil count is 0.0.  His hemoglobin has dropped to 6.6 today down from a baseline of around 8-9.  Hemoccult negative and he has no history of melena, hematochezia, or hematemesis.  No concern for acute GI bleed presently.  He was started on cefepime for treatment of febrile neutropenia in the setting of UTI.  Blood cultures and lactate were also obtained.  Chest x-ray showed no acute cardiopulmonary abnormalities, no evidence of pneumonia or pleural effusion so D-dimer was obtained given new oxygen requirement in an immunocompromised cancer patient.  This did come back elevated so a CTA was ordered.  Spoke with Dr. [name] with [facility] hospitalist service who agrees to assume care of patient and bring him into the hospital for further evaluation and management.  Patient was seen and evaluated by Dr. Sabra Heck who agrees with assessment and plan at this time.  Covid test and CTA are pending presently.  Final Clinical Impressions(s) / ED Diagnoses   Final diagnoses:  Febrile neutropenia ([place])  Acute saddle pulmonary embolism with acute cor pulmonale ([facility])  Urinary tract infection associated with indwelling urethral catheter, initial encounter [facility])    ED Discharge Orders    None       Debroah Baller 08/26/19 2329    Noemi Chapel, MD 08/27/19 [phone]

## 2019-08-26 NOTE — ED Triage Notes (Signed)
Ems reports pt hasn't felt well for the past week.  Reports was diagnosed with uti and pcp was supposed to call in an antibiotic today.  Pt has indwelling foley cath.  Home health RN came out today and told pt he had fever 102.  EMS arrived and pt had temp 104.  Pt alert and oriented.  Pt c/o pain at catheter insertion site.  Daughter told ems she noticed blood in pt's urine yesterday.  EMS gave 1 liter ns IV.  EMS says pt's room air o2 sat was 88%.  EMS placed pt on o2 at 2liters and increased to 95%.  HR 84, 18rr, bp 140/60

## 2019-08-26 NOTE — ED Notes (Signed)
Date and time results received: 08/26/19 1407  Test: Hgb Critical Value: 6.6  Name of Provider Notified: [place], [place]  Orders Received? Or Actions Taken?: See chart

## 2019-08-27 ENCOUNTER — Inpatient Hospital Stay (HOSPITAL_COMMUNITY): Payer: Medicare Other

## 2019-08-27 ENCOUNTER — Encounter (HOSPITAL_COMMUNITY): Payer: Self-pay

## 2019-08-27 DIAGNOSIS — I2602 Saddle embolus of pulmonary artery with acute cor pulmonale: Secondary | ICD-10-CM

## 2019-08-27 DIAGNOSIS — N390 Urinary tract infection, site not specified: Secondary | ICD-10-CM

## 2019-08-27 DIAGNOSIS — D709 Neutropenia, unspecified: Secondary | ICD-10-CM

## 2019-08-27 DIAGNOSIS — I361 Nonrheumatic tricuspid (valve) insufficiency: Secondary | ICD-10-CM

## 2019-08-27 DIAGNOSIS — C8333 Diffuse large B-cell lymphoma, intra-abdominal lymph nodes: Secondary | ICD-10-CM

## 2019-08-27 DIAGNOSIS — T83511A Infection and inflammatory reaction due to indwelling urethral catheter, initial encounter: Principal | ICD-10-CM

## 2019-08-27 DIAGNOSIS — R5081 Fever presenting with conditions classified elsewhere: Secondary | ICD-10-CM

## 2019-08-27 LAB — BASIC METABOLIC PANEL
Anion gap: 9 (ref 5–15)
BUN: 26 mg/dL — ABNORMAL HIGH (ref 8–23)
CO2: 21 mmol/L — ABNORMAL LOW (ref 22–32)
Calcium: 8.3 mg/dL — ABNORMAL LOW (ref 8.9–10.3)
Chloride: 105 mmol/L (ref 98–111)
Creatinine, Ser: 1.18 mg/dL (ref 0.61–1.24)
GFR calc Af Amer: >60 mL/min (ref >60)
GFR calc non Af Amer: >60 mL/min (ref >60)
Glucose, Bld: 170 mg/dL — ABNORMAL HIGH (ref 70–99)
Potassium: 3.2 mmol/L — ABNORMAL LOW (ref 3.5–5.1)
Sodium: 135 mmol/L (ref 135–145)

## 2019-08-27 LAB — TYPE AND SCREEN
ABO/RH(D): A POS
Antibody Screen: NEGATIVE
Unit division: 0

## 2019-08-27 LAB — CBC
HCT: 24.6 % — ABNORMAL LOW (ref 39.0–52.0)
Hemoglobin: 7.7 g/dL — ABNORMAL LOW (ref 13.0–17.0)
MCH: 31.2 pg (ref 26.0–34.0)
MCHC: 31.3 g/dL (ref 30.0–36.0)
MCV: 99.6 fL (ref 80.0–100.0)
Platelets: 135 10*3/uL — ABNORMAL LOW (ref 150–400)
RBC: 2.47 MIL/uL — ABNORMAL LOW (ref 4.22–5.81)
RDW: 15 % (ref 11.5–15.5)
WBC: 0.3 10*3/uL — CL (ref 4.0–10.5)
nRBC: 0 % (ref 0.0–0.2)

## 2019-08-27 LAB — GLUCOSE, CAPILLARY
Glucose-Capillary: 141 mg/dL — ABNORMAL HIGH (ref 70–99)
Glucose-Capillary: 127 mg/dL — ABNORMAL HIGH (ref 70–99)
Glucose-Capillary: 110 mg/dL — ABNORMAL HIGH (ref 70–99)

## 2019-08-27 LAB — CBG MONITORING, ED
Glucose-Capillary: 147 mg/dL — ABNORMAL HIGH (ref 70–99)
Glucose-Capillary: 141 mg/dL — ABNORMAL HIGH (ref 70–99)

## 2019-08-27 LAB — ECHOCARDIOGRAM COMPLETE
Height: 71 in
Weight: 3231.06 oz

## 2019-08-27 LAB — BPAM RBC
Blood Product Expiration Date: 202011122359
ISSUE DATE / TIME: 202010201928
Unit Type and Rh: 6200

## 2019-08-27 LAB — APTT
aPTT: 37 seconds — ABNORMAL HIGH (ref 24–36)
aPTT: 51 seconds — ABNORMAL HIGH (ref 24–36)
aPTT: 60 seconds — ABNORMAL HIGH (ref 24–36)

## 2019-08-27 LAB — OCCULT BLOOD, POC DEVICE: Fecal Occult Bld: NEGATIVE

## 2019-08-27 LAB — HEPARIN LEVEL (UNFRACTIONATED): Heparin Unfractionated: <0.1 IU/mL — ABNORMAL LOW (ref 0.30–0.70)

## 2019-08-27 MED ORDER — ARGATROBAN 50 MG/50ML IV SOLN
0.5000 ug/kg/min | INTRAVENOUS | Status: DC
  Administered 2019-08-27 – 2019-08-31 (×5): 0.5 ug/kg/min via INTRAVENOUS
  Filled 2019-08-27 (×6): qty 50

## 2019-08-27 MED ORDER — INSULIN ASPART 100 UNIT/ML ~~LOC~~ SOLN: 0.0000 [IU] | Freq: Every day | SUBCUTANEOUS | Status: DC

## 2019-08-27 MED ORDER — ACETAMINOPHEN 500 MG PO TABS
500.0000 mg | ORAL_TABLET | Freq: Once | ORAL | Status: AC
  Administered 2019-08-27: 500 mg via ORAL
  Filled 2019-08-27: qty 1

## 2019-08-27 MED ORDER — POLYETHYLENE GLYCOL 3350 17 G PO PACK: 17.0000 g | PACK | Freq: Every day | ORAL | Status: DC | PRN

## 2019-08-27 MED ORDER — CHLORHEXIDINE GLUCONATE CLOTH 2 % EX PADS
6.0000 | MEDICATED_PAD | Freq: Every day | CUTANEOUS | Status: DC
  Administered 2019-08-27 – 2019-08-31 (×5): 6 via TOPICAL

## 2019-08-27 MED ORDER — POTASSIUM CHLORIDE CRYS ER 20 MEQ PO TBCR
40.0000 meq | EXTENDED_RELEASE_TABLET | Freq: Once | ORAL | Status: AC
  Administered 2019-08-27: 40 meq via ORAL
  Filled 2019-08-27: qty 2

## 2019-08-27 MED ORDER — ONDANSETRON HCL 4 MG PO TABS: 4.0000 mg | ORAL_TABLET | Freq: Four times a day (QID) | ORAL | Status: DC | PRN

## 2019-08-27 MED ORDER — INSULIN ASPART 100 UNIT/ML ~~LOC~~ SOLN
0.0000 [IU] | Freq: Three times a day (TID) | SUBCUTANEOUS | Status: DC
  Administered 2019-08-27 (×3): 1 [IU] via SUBCUTANEOUS
  Administered 2019-08-28 (×2): 2 [IU] via SUBCUTANEOUS
  Administered 2019-08-29: 1 [IU] via SUBCUTANEOUS
  Administered 2019-08-29: 2 [IU] via SUBCUTANEOUS
  Administered 2019-08-29 – 2019-08-31 (×4): 1 [IU] via SUBCUTANEOUS
  Filled 2019-08-27: qty 1

## 2019-08-27 MED ORDER — ONDANSETRON HCL 4 MG/2ML IJ SOLN: 4.0000 mg | Freq: Four times a day (QID) | INTRAMUSCULAR | Status: DC | PRN

## 2019-08-27 MED ORDER — FLUCONAZOLE IN SODIUM CHLORIDE 200-0.9 MG/100ML-% IV SOLN
INTRAVENOUS | Status: AC
  Filled 2019-08-27: qty 200

## 2019-08-27 MED ORDER — ENOXAPARIN SODIUM 40 MG/0.4ML ~~LOC~~ SOLN
40.0000 mg | SUBCUTANEOUS | Status: DC
  Filled 2019-08-27: qty 0.4

## 2019-08-27 MED ORDER — ACETAMINOPHEN 325 MG PO TABS
650.0000 mg | ORAL_TABLET | Freq: Four times a day (QID) | ORAL | Status: DC | PRN
  Administered 2019-08-27 – 2019-08-28 (×3): 650 mg via ORAL
  Filled 2019-08-27 (×3): qty 2

## 2019-08-27 MED ORDER — ACETAMINOPHEN 650 MG RE SUPP: 650.0000 mg | Freq: Four times a day (QID) | RECTAL | Status: DC | PRN

## 2019-08-27 MED ORDER — HEPARIN (PORCINE) 25000 UT/250ML-% IV SOLN
1300.0000 [IU]/h | INTRAVENOUS | Status: DC
  Administered 2019-08-27: 1300 [IU]/h via INTRAVENOUS

## 2019-08-27 MED ORDER — POTASSIUM CHLORIDE IN NACL 40-0.9 MEQ/L-% IV SOLN
INTRAVENOUS | Status: DC
  Filled 2019-08-27 (×2): qty 1000

## 2019-08-27 NOTE — Final Consult Note (Signed)
[facility] Consultation Oncology  Name: Daniel Richards      MRN: [id number]    Location: IC05/IC05-01  Date: 08/27/2019 Time:6:04 PM   REFERRING PHYSICIAN: Dr. Manuella Ghazi  REASON FOR CONSULT: Bilateral pulmonary embolism and neutropenic fever.   DIAGNOSIS: Stage IV diffuse large B-cell lymphoma.  HISTORY OF PRESENT ILLNESS: Daniel Richards is a 73 year old very pleasant white male seen in consultation today for history of diffuse large B-cell lymphoma, undergoing active chemotherapy.  He came to the ER on 08/26/2019 when he had a fever of 102.  In the ER he was hypoxic.  A CT scan of the chest PE protocol showed bilateral pulmonary emboli with cardiac strain.  He was started on heparin.  As his platelets went down today, he was changed to argatroban.  He feels improvement in his breathing since anticoagulation was started.  He did receive his first cycle of R-CHOP chemotherapy on 08/20/2019 and 08/21/2019.  He did not experience any chemotherapy related side effects.  Denied any nausea, vomiting, diarrhea or constipation.  He also received zoledronic acid on 08/20/2019 for hypercalcemia from lymphoma.  He is on IV cefepime, Flagyl and fluconazole.  He also received 1 unit of PRBC when his hemoglobin dropped to 6.6.  Calcium today is down to 8.3.  Son is at the bedside.  Foley catheter was changed today.  Denies any chills.  He had a fever this afternoon.  PAST MEDICAL HISTORY:   Past Medical History:  Diagnosis Date  . Anxiety and depression   . Cancer ([place])    lymphoma  . Diabetes mellitus 06/09/2011   pt taking metformin  . DJD of shoulder    right   . Hyperlipidemia   . Hypertension   . Migraines   . Port-A-Cath in place 08/13/2019  . Tremor   . Vitamin D deficiency     ALLERGIES: Allergies  Allergen Reactions  . Vancomycin Hives    Patient received Benadryl to treat the hives      MEDICATIONS: I have reviewed the patient's current medications.     PAST SURGICAL HISTORY Past  Surgical History:  Procedure Laterality Date  . BACK SURGERY    . KNEE SURGERY    . PORTACATH PLACEMENT Left 08/08/2019   Procedure: INSERTION PORT-A-CATH;  Surgeon: Aviva Signs, MD;  Location: [facility];  Service: General;  Laterality: Left;  . SHOULDER SURGERY      FAMILY HISTORY: Family History  Problem Relation Age of Onset  . Cancer Sister   . Cancer Sister   . Cancer Brother   . Heart attack Father   . Cancer Brother   . Heart attack Brother   . Healthy Son   . Healthy Daughter   . Migraines Neg Hx     SOCIAL HISTORY:  reports that he has never smoked. He has never used smokeless tobacco. He reports that he does not drink alcohol or use drugs.  PERFORMANCE STATUS: The patient's performance status is 3 - Symptomatic, >50% confined to bed  PHYSICAL EXAM: Most Recent Vital Signs: Blood pressure 125/62, pulse 86, temperature (!) 100.7 F (38.2 C), temperature source Axillary, resp. rate 18, height 5\' 11"  (1.803 m), weight 201 lb 15.1 oz (91.6 kg), SpO2 100 %. BP 125/62   Pulse 86   Temp (!) 100.7 F (38.2 C) (Axillary)   Resp 18   Ht 5\' 11"  (1.803 m)   Wt 201 lb 15.1 oz (91.6 kg)   SpO2 100%   BMI 28.17 kg/m  General appearance: alert, cooperative and appears stated age Neck: no adenopathy Lungs: clear to auscultation bilaterally Heart: regular rate and rhythm Abdomen: soft, non-tender; bowel sounds normal; no masses,  no organomegaly Extremities: Trace edema bilaterally.  No swelling or erythema. Skin: Skin color, texture, turgor normal. No rashes or lesions Lymph nodes: Cervical, supraclavicular, and axillary nodes normal. Neurologic: Grossly normal  LABORATORY DATA:  Results for orders placed or performed during the hospital encounter of 08/26/19 (from the past 48 hour(s))  Lactic acid, plasma     Status: None   Collection Time: 08/26/19  1:30 PM  Result Value Ref Range   Lactic Acid, Venous 1.3 0.5 - 1.9 mmol/L    Comment: Performed at [facility], [address], [place], [place] [address]  Comprehensive metabolic panel     Status: Abnormal   Collection Time: 08/26/19  1:30 PM  Result Value Ref Range   Sodium 130 (L) 135 - 145 mmol/L    Comment: DELTA CHECK NOTED   Potassium 3.5 3.5 - 5.1 mmol/L   Chloride 102 98 - 111 mmol/L   CO2 21 (L) 22 - 32 mmol/L   Glucose, Bld 176 (H) 70 - 99 mg/dL   BUN 31 (H) 8 - 23 mg/dL   Creatinine, Ser 1.19 0.61 - 1.24 mg/dL   Calcium 8.3 (L) 8.9 - 10.3 mg/dL   Total Protein 5.7 (L) 6.5 - 8.1 g/dL   Albumin 2.3 (L) 3.5 - 5.0 g/dL   AST 23 15 - 41 U/L   ALT 19 0 - 44 U/L   Alkaline Phosphatase 60 38 - 126 U/L   Total Bilirubin 0.4 0.3 - 1.2 mg/dL   GFR calc non Af Amer 59 (L) >60 mL/min   GFR calc Af Amer >60 >60 mL/min   Anion gap 7 5 - 15    Comment: Performed at [facility], [address] [place], [place] [address]  CBC WITH DIFFERENTIAL     Status: Abnormal   Collection Time: 08/26/19  1:30 PM  Result Value Ref Range   WBC 0.3 (LL) 4.0 - 10.5 K/uL    Comment: REPEATED TO VERIFY WHITE COUNT CONFIRMED ON SMEAR THIS CRITICAL RESULT HAS VERIFIED AND BEEN CALLED TO Daniel Richards BY HILLARY FLYNT ON 10 20 2020 AT 1404, AND HAS BEEN READ BACK.     RBC 2.09 (L) 4.22 - 5.81 MIL/uL   Hemoglobin 6.6 (LL) 13.0 - 17.0 g/dL    Comment: REPEATED TO VERIFY THIS CRITICAL RESULT HAS VERIFIED AND BEEN CALLED TO Daniel Richards BY HILLARY FLYNT ON 10 20 2020 AT 1404, AND HAS BEEN READ BACK.     HCT 21.3 (L) 39.0 - 52.0 %   MCV 101.9 (H) 80.0 - 100.0 fL   MCH 31.6 26.0 - 34.0 pg   MCHC 31.0 30.0 - 36.0 g/dL   RDW 14.4 11.5 - 15.5 %   Platelets 143 (L) 150 - 400 K/uL   nRBC 0.0 0.0 - 0.2 %   Neutrophils Relative % 4 %   Neutro Abs 0.0 (L) 1.7 - 7.7 K/uL   Lymphocytes Relative 84 %   Lymphs Abs 0.2 (L) 0.7 - 4.0 K/uL   Monocytes Relative 12 %   Monocytes Absolute 0.0 (L) 0.1 - 1.0 K/uL   Eosinophils Relative 0 %   Eosinophils Absolute 0.0 0.0 - 0.5 K/uL   Basophils Relative 0 %   Basophils Absolute 0.0 0.0  - 0.1 K/uL   Immature Granulocytes 0 %   Abs  Immature Granulocytes 0.00 0.00 - 0.07 K/uL    Comment: Performed at [facility], [address], [place], [place] [address]  Protime-INR     Status: Abnormal   Collection Time: 08/26/19  1:30 PM  Result Value Ref Range   Prothrombin Time 16.6 (H) 11.4 - 15.2 seconds   INR 1.4 (H) 0.8 - 1.2    Comment: (NOTE) INR goal varies based on device and disease states. Performed at [facility], [address] [place], [place] [address]   Urinalysis, Routine w reflex microscopic     Status: Abnormal   Collection Time: 08/26/19  1:30 PM  Result Value Ref Range   Color, Urine YELLOW YELLOW   APPearance CLOUDY (A) CLEAR   Specific Gravity, Urine 1.006 1.005 - 1.030   pH 7.0 5.0 - 8.0   Glucose, UA >=500 (A) NEGATIVE mg/dL   Hgb urine dipstick LARGE (A) NEGATIVE   Bilirubin Urine NEGATIVE NEGATIVE   Ketones, ur NEGATIVE NEGATIVE mg/dL   Protein, ur 100 (A) NEGATIVE mg/dL   Nitrite NEGATIVE NEGATIVE   Leukocytes,Ua MODERATE (A) NEGATIVE   RBC / HPF >50 (H) 0 - 5 RBC/hpf   WBC, UA 11-20 0 - 5 WBC/hpf   Bacteria, UA RARE (A) NONE SEEN   Squamous Epithelial / LPF 0-5 0 - 5   WBC Clumps PRESENT    Mucus PRESENT    Non Squamous Epithelial 0-5 (A) NONE SEEN    Comment: Performed at [facility], [address], [place], [place] [address]  APTT     Status: None   Collection Time: 08/26/19  1:30 PM  Result Value Ref Range   aPTT 29 24 - 36 seconds    Comment: Performed at [facility], [address] [place], [place] [address]  Blood Culture (routine x 2)     Status: None (Preliminary result)   Collection Time: 08/26/19  1:36 PM   Specimen: Left Antecubital; Blood  Result Value Ref Range   Specimen Description LEFT ANTECUBITAL    Special Requests      BOTTLES DRAWN AEROBIC AND ANAEROBIC Blood Culture adequate volume   Culture      NO GROWTH < 24 HOURS Performed at [facility], [address] [place], [place] [address]    Report Status PENDING    SARS CORONAVIRUS 2 (TAT 6-24 HRS) Nasopharyngeal Nasopharyngeal Swab     Status: None   Collection Time: 08/26/19  2:00 PM   Specimen: Nasopharyngeal Swab  Result Value Ref Range   SARS Coronavirus 2 NEGATIVE NEGATIVE    Comment: (NOTE) SARS-CoV-2 target nucleic acids are NOT DETECTED. The SARS-CoV-2 RNA is generally detectable in upper and lower respiratory specimens during the acute phase of infection. Negative results do not preclude SARS-CoV-2 infection, do not rule out co-infections with other pathogens, and should not be used as the sole basis for treatment or other patient management decisions. Negative results must be combined with clinical observations, patient history, and epidemiological information. The expected result is Negative. Fact Sheet for Patients: SugarRoll.be Fact Sheet for Healthcare Providers: https://www.woods-mathews.com/ This test is not yet approved or cleared by the [place] FDA and  has been authorized for detection and/or diagnosis of SARS-CoV-2 by FDA under an Emergency Use Authorization (EUA). This EUA will remain  in effect (meaning this test can be used) for the duration of the COVID-19 declaration under Section 56 4(b)(1) of the Act, 21 U.S.C. section 360bbb-3(b)(1), unless the authorization is terminated or revoked sooner. Performed at [facility], [place] [address], [place], [place] [address]   Blood Culture (routine x 2)     Status: None (Preliminary result)   Collection Time: 08/26/19  2:01 PM   Specimen: BLOOD LEFT HAND  Result Value Ref Range   Specimen Description BLOOD LEFT HAND    Special Requests      BOTTLES DRAWN AEROBIC AND ANAEROBIC Blood Culture adequate volume   Culture      NO GROWTH < 24 HOURS Performed at [facility], [address], [place], [place] [address]    Report Status PENDING   MRSA PCR Screening     Status: None   Collection Time: 08/26/19  2:10 PM    Specimen: Nasopharyngeal  Result Value Ref Range   MRSA by PCR NEGATIVE NEGATIVE    Comment:        The GeneXpert MRSA Assay (FDA approved for NASAL specimens only), is one component of a comprehensive MRSA colonization surveillance program. It is not intended to diagnose MRSA infection nor to guide or monitor treatment for MRSA infections. Performed at [facility], [address], [place], [place] [address]   Type and screen [facility]     Status: None   Collection Time: 08/26/19  2:47 PM  Result Value Ref Range   ABO/RH(D) A POS    Antibody Screen NEG    Sample Expiration 08/29/2019,2359    Unit Number [id number]    Blood Component Type RED CELLS,LR    Unit division 00    Status of Unit ISSUED,FINAL    Transfusion Status OK TO TRANSFUSE    Crossmatch Result      Compatible Performed at [facility], [address] [place], [place] [address]   Prepare RBC     Status: None   Collection Time: 08/26/19  2:47 PM  Result Value Ref Range   Order Confirmation      ORDER PROCESSED BY BLOOD BANK Performed at [facility], [address], [place], [place] [address]   ABO/Rh     Status: None   Collection Time: 08/26/19  2:47 PM  Result Value Ref Range   ABO/RH(D)      A POS Performed at [facility], [address] [place], [place] [address]   Lactic acid, plasma     Status: None   Collection Time: 08/26/19  2:53 PM  Result Value Ref Range   Lactic Acid, Venous 0.9 0.5 - 1.9 mmol/L    Comment: Performed at [facility], [address] [place], [place] [address]  D-dimer, quantitative (not at [facility])     Status: Abnormal   Collection Time: 08/26/19  2:53 PM  Result Value Ref Range   D-Dimer, Quant 3.44 (H) 0.00 - 0.50 ug/mL-FEU    Comment: (NOTE) At the manufacturer cut-off of 0.50 ug/mL FEU, this assay has been documented to exclude PE with a sensitivity and negative predictive value of 97 to 99%.  At this time, this assay has not been approved by the FDA to  exclude DVT/VTE. Results should be correlated with clinical presentation. Performed at [facility], [address] [place], [place] [address]   Occult blood, poc device     Status: None   Collection Time: 08/26/19  2:56 PM  Result Value Ref Range   Fecal Occult Bld NEGATIVE NEGATIVE  CBG monitoring, ED     Status: Abnormal   Collection Time: 08/27/19  1:29 AM  Result Value Ref Range   Glucose-Capillary 147 (H) 70 -  99 mg/dL  Heparin level (unfractionated)     Status: Abnormal   Collection Time: 08/27/19  4:12 AM  Result Value Ref Range   Heparin Unfractionated <0.10 (L) 0.30 - 0.70 IU/mL    Comment: (NOTE) If heparin results are below expected values, and patient dosage has  been confirmed, suggest follow up testing of antithrombin III levels. Performed at [facility], [address], [place], [place] [id number]   Basic metabolic panel     Status: Abnormal   Collection Time: 08/27/19  4:12 AM  Result Value Ref Range   Sodium 135 135 - 145 mmol/L   Potassium 3.2 (L) 3.5 - 5.1 mmol/L   Chloride 105 98 - 111 mmol/L   CO2 21 (L) 22 - 32 mmol/L   Glucose, Bld 170 (H) 70 - 99 mg/dL   BUN 26 (H) 8 - 23 mg/dL   Creatinine, Ser 1.18 0.61 - 1.24 mg/dL   Calcium 8.3 (L) 8.9 - 10.3 mg/dL   GFR calc non Af Amer >60 >60 mL/min   GFR calc Af Amer >60 >60 mL/min   Anion gap 9 5 - 15    Comment: Performed at [facility], [address], [place], [place] [address]  CBC     Status: Abnormal   Collection Time: 08/27/19  4:12 AM  Result Value Ref Range   WBC 0.3 (LL) 4.0 - 10.5 K/uL    Comment: CRITICAL VALUE NOTED.  VALUE IS CONSISTENT WITH PREVIOUSLY REPORTED AND CALLED VALUE.   RBC 2.47 (L) 4.22 - 5.81 MIL/uL   Hemoglobin 7.7 (L) 13.0 - 17.0 g/dL    Comment: POST TRANSFUSION SPECIMEN   HCT 24.6 (L) 39.0 - 52.0 %   MCV 99.6 80.0 - 100.0 fL   MCH 31.2 26.0 - 34.0 pg   MCHC 31.3 30.0 - 36.0 g/dL   RDW 15.0 11.5 - 15.5 %   Platelets 135 (L) 150 - 400 K/uL   nRBC 0.0 0.0 - 0.2 %     Comment: Performed at [facility], [address], [place], [place] [address]  CBG monitoring, ED     Status: Abnormal   Collection Time: 08/27/19  7:45 AM  Result Value Ref Range   Glucose-Capillary 141 (H) 70 - 99 mg/dL  aPTT     Status: Abnormal   Collection Time: 08/27/19 10:59 AM  Result Value Ref Range   aPTT 37 (H) 24 - 36 seconds    Comment:        IF BASELINE aPTT IS ELEVATED, SUGGEST PATIENT RISK ASSESSMENT BE USED TO DETERMINE APPROPRIATE ANTICOAGULANT THERAPY. Performed at [facility], [address] [place], [place] [address]   Glucose, capillary     Status: Abnormal   Collection Time: 08/27/19 11:15 AM  Result Value Ref Range   Glucose-Capillary 141 (H) 70 - 99 mg/dL  APTT     Status: Abnormal   Collection Time: 08/27/19 12:58 PM  Result Value Ref Range   aPTT 51 (H) 24 - 36 seconds    Comment:        IF BASELINE aPTT IS ELEVATED, SUGGEST PATIENT RISK ASSESSMENT BE USED TO DETERMINE APPROPRIATE ANTICOAGULANT THERAPY. Performed at [facility], [address], [place], [place] [address]   APTT     Status: Abnormal   Collection Time: 08/27/19  2:58 PM  Result Value Ref Range   aPTT 60 (H) 24 - 36 seconds    Comment:        IF BASELINE aPTT IS ELEVATED,  SUGGEST PATIENT RISK ASSESSMENT BE USED TO DETERMINE APPROPRIATE ANTICOAGULANT THERAPY. Performed at [facility], [address] [place], [place] [address]   Glucose, capillary     Status: Abnormal   Collection Time: 08/27/19  4:18 PM  Result Value Ref Range   Glucose-Capillary 127 (H) 70 - 99 mg/dL      RADIOGRAPHY: Ct Angio Chest Pe W And/or Wo Contrast  Result Date: 08/26/2019 CLINICAL DATA:  Fever hypoxia not feeling well EXAM: CT ANGIOGRAPHY CHEST WITH CONTRAST TECHNIQUE: Multidetector CT imaging of the chest was performed using the standard protocol during bolus administration of intravenous contrast. Multiplanar CT image reconstructions and MIPs were obtained to evaluate the vascular anatomy.  CONTRAST:  130mL OMNIPAQUE IOHEXOL 350 MG/ML SOLN COMPARISON:  Chest x-ray 08/26/2019, PET CT 08/07/2019, CT chest 07/25/2019 FINDINGS: Cardiovascular: Satisfactory opacification of the pulmonary arteries to the segmental level. Small saddle embolus within the distal left pulmonary artery, with extension of thrombus into left upper and lower lobe segmental and subsegmental vessels. Small nonocclusive thrombi within right upper, right middle, and right lower lobe segmental and subsegmental vessels. RV LV ratio is 1.0. Nonaneurysmal aorta with mild aortic atherosclerosis. No dissection. Normal heart size. Coronary vascular calcifications. No large pericardial effusion Mediastinum/Nodes: Midline trachea. No thyroid mass. Scattered calcifications in the right lobe. Esophagus within normal limits. Similar to decreased size of mediastinal lymph nodes. Periesophageal lymph node measures 1 cm compared with 1.1 cm previously. Lungs/Pleura: Emphysema. No pleural effusion. Small foci of sub pleural density, likely reflecting atelectasis. Upper Abdomen: Incompletely visualized retroperitoneal adenopathy. Musculoskeletal: No acute or suspicious osseous abnormality. Review of the MIP images confirms the above findings. IMPRESSION: 1. Positive for acute bilateral pulmonary emboli, including small saddle embolus in the distal left pulmonary artery. Positive for acute PE with CT evidence of right heart strain (RV/LV Ratio = 1.0) consistent with at least submassive (intermediate risk) PE. The presence of right heart strain has been associated with an increased risk of morbidity and mortality. Please activate Code PE by paging [phone]. 2. Emphysema 3. Similar appearance of small mediastinal nodes. Incompletely visualized retroperitoneal adenopathy in the abdomen Critical Value/emergent results were called by telephone at the time of interpretation on 08/26/2019 at 6:20 pm to provider Dr. Bethena Roys, who verbally  acknowledged these results. Aortic Atherosclerosis (ICD10-I70.0) and Emphysema (ICD10-J43.9). Electronically Signed   By: Donavan Foil M.D.   On: 08/26/2019 18:21   Dg Chest Port 1 View  Result Date: 08/26/2019 CLINICAL DATA:  Hypoxia EXAM: PORTABLE CHEST 1 VIEW COMPARISON:  08/15/2019 FINDINGS: Left chest wall port is again noted. Cardiac shadow is stable. The lungs are well aerated bilaterally. No bony abnormality is noted. IMPRESSION: No active disease. Electronically Signed   By: Inez Catalina M.D.   On: 08/26/2019 13:59        ASSESSMENT and PLAN:  1.  Acute pulmonary embolism: -CT chest on 08/26/2019 was positive for acute bilateral pulmonary emboli, including small saddle embolus in the distal left pulmonary artery.  CT evidence of right heart strain present. -He had echocardiogram done today. -He was initially started on heparin.  As his platelet count dropped, it was changed to argatroban due to concern for heparin-induced thrombocytopenia. -I think the platelet drop is mainly from chemotherapy induced myelosuppression. -He could be switched to Eliquis when ready for discharge.  2.  Neutropenic fever: -He received Neulasta injection on 08/22/2019. -He is on cefepime, Flagyl and Diflucan. -He had fever this afternoon. -Blood cultures from 08/26/2019 did not  show any growth. -Urine culture from 08/25/2019 showed more than 100,000 colonies of Proteus mirabilis.  3.  Stage IV diffuse large B-cell lymphoma: -Diagnosed on 07/30/2019 via left retroperitoneal lymph node biopsy. -Received first cycle of R mini CHOP on 08/21/2019. -Received Neulasta on 08/22/2019.  4.  Hypercalcemia: -He had malignancy related hypercalcemia and was treated with Zometa, last dose on 08/20/2019. -Calcium today is normal.  All questions were answered. The patient knows to call the clinic with any problems, questions or concerns. We can certainly see the patient much sooner if necessary.    Derek Jack

## 2019-08-27 NOTE — Progress Notes (Signed)
ANTICOAGULATION CONSULT NOTE - [place] for Heparin--> argatroban dosing Indication: pulmonary embolus  Allergies  Allergen Reactions  . Vancomycin Hives    Patient received Benadryl to treat the hives    Patient Measurements: Height: 5\' 11"  (180.3 cm) Weight: 201 lb 15.1 oz (91.6 kg) IBW/kg (Calculated) : 75.3 Heparin Dosing Weight: total body weight  Vital Signs: Temp: 98.7 F (37.1 C) (10/21 1113) Temp Source: Oral (10/21 1113) BP: 136/61 (10/21 0930) Pulse Rate: 76 (10/21 1130)  Labs: Recent Labs    08/25/19 0947  08/26/19 1330 08/27/19 0412 08/27/19 1059 08/27/19 1258 08/27/19 1458  HGB 8.6*  --  6.6* 7.7*  --   --   --   HCT 28.0*  --  21.3* 24.6*  --   --   --   PLT 276  --  143* 135*  --   --   --   APTT  --    < > 29  --  37* 51* 60*  LABPROT  --   --  16.6*  --   --   --   --   INR  --   --  1.4*  --   --   --   --   HEPARINUNFRC  --   --   --  <0.10*  --   --   --   CREATININE 1.20  --  1.19 1.18  --   --   --    < > = values in this interval not displayed.    Estimated Creatinine Clearance: 62.6 mL/min (by C-G formula based on SCr of 1.18 mg/dL).   Medical History: Past Medical History:  Diagnosis Date  . Anxiety and depression   . Cancer ([place])    lymphoma  . Diabetes mellitus 06/09/2011   pt taking metformin  . DJD of shoulder    right   . Hyperlipidemia   . Hypertension   . Migraines   . Port-A-Cath in place 08/13/2019  . Tremor   . 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 Noted to be hypoxic with elevated d-dimer. CTa shows acute bilateral PE, including small saddle embolus with evidence of right heart strain.  Pharmacy consulted to dose IV heparin, but platelet count has decreased from 276K on 08-25-19 to 135K this morning.    08-27-19 1300 UPDATE  First 2-hr aPTT is 51 second  Second aPTT is 60 seconds  Both of these are  within therapeutic goal range   Goal of Therapy:   Target aPTT range of 50-90 seconds (1.5-3x control)     Plan:   Continue argatroban infusion at 0.29mcg/kg/min-->2.45mL/hr (ICU patient)   Check daily CBC and aPTT  Assess patient for hepatic function changes and further thromboembolic conmplications  Monitor platelets closely for signs/symptoms of bleeding and continued thrombocytopenia  Despina Pole, Pharm. D. Clinical Pharmacist 08/27/2019 3:52 PM

## 2019-08-27 NOTE — Progress Notes (Signed)
ANTICOAGULATION CONSULT NOTE - [place] for Heparin Indication: pulmonary embolus  Allergies  Allergen Reactions  . Vancomycin Hives    Patient received Benadryl to treat the hives    Patient Measurements: Height: 5\' 11"  (180.3 cm) Weight: 180 lb (81.6 kg) IBW/kg (Calculated) : 75.3 Heparin Dosing Weight: total body weight  Vital Signs: Temp: 102.9 F (39.4 C) (10/21 0238) Temp Source: Oral (10/21 0238) BP: 157/77 (10/21 0530) Pulse Rate: 84 (10/21 0530)  Labs: Recent Labs    08/25/19 0947 08/26/19 1330 08/27/19 0412  HGB 8.6* 6.6* 7.7*  HCT 28.0* 21.3* 24.6*  PLT 276 143* 135*  APTT  --  29  --   LABPROT  --  16.6*  --   INR  --  1.4*  --   HEPARINUNFRC  --   --  <0.10*  CREATININE 1.20 1.19 1.18    Estimated Creatinine Clearance: 57.6 mL/min (by C-G formula based on SCr of 1.18 mg/dL).   Medical History: Past Medical History:  Diagnosis Date  . Anxiety and depression   . Cancer ([place])    lymphoma  . Diabetes mellitus 06/09/2011   pt taking metformin  . DJD of shoulder    right   . Hyperlipidemia   . Hypertension   . Migraines   . Port-A-Cath in place 08/13/2019  . Tremor   . Vitamin D deficiency     Medications:  Scheduled:  . allopurinol  300 mg Oral Daily  . calcitRIOL  0.5 mcg Oral Daily  . citalopram  20 mg Oral QHS  . folic acid  1 mg Oral Daily  . insulin aspart  0-5 Units Subcutaneous QHS  . insulin aspart  0-9 Units Subcutaneous TID WC  . lisinopril  20 mg Oral Daily  . pantoprazole (PROTONIX) IV  40 mg Intravenous Q24H  . primidone  75 mg Oral Q8H  . propranolol  40 mg Oral BID  . topiramate  200 mg Oral QHS   Infusions:  . ceFEPime (MAXIPIME) IV    . fluconazole (DIFLUCAN) IV Stopped (08/26/19 2342)  . heparin 1,000 Units/hr (08/26/19 2002)  . metronidazole Stopped (08/27/19 0355)   PRN:   Assessment: 73 yo male with B-cell lymphoma on chemotherapy presents with febrile neutropenia. Noted to be  hypoxic with elevated d-dimer. CTa shows acute bilateral PE, including small saddle embolus with evidence of right heart strain.  Pharmacy consulted to dose IV heparin. Hgb noted 6.6 which is decreased from 8.6 yesterday, MD aware and 1 unit PRBC ordered.  Today, 08/27/19  Hgb up to 7.7 after 1 PRBC unit transfusion  plt 135, low. Pt neutropenic  Scr 1.18 mg/dl  No line or bleeding issues per RN    Goal of Therapy:  Heparin level 0.3-0.7 units/ml Monitor platelets by anticoagulation protocol: Yes   Plan:   No heparin bolus  Increase heparin IV infusion 1300 units/hr   Check heparin level in 8hrs  Daily heparin level and CBC  Monitor closely for signs/symptoms of bleeding   Royetta Asal, PharmD, BCPS 08/27/2019 5:58 AM

## 2019-08-27 NOTE — ED Notes (Signed)
Cooling blanket placed on pt per hospitalist, rectal temp 103.1

## 2019-08-27 NOTE — Progress Notes (Signed)
PROGRESS NOTE    Daniel Richards  [id number] DOB: January 05, 1946 DOA: 08/26/2019 PCP: [name], FNP   Brief Narrative:  Per HPI: Daniel Richards is a 73 y.o. 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  Patient was brought to the ED via EMS with reports a fever up to 104 recorded by EMS.  Daughter reported over the past week patient has been feeling unwell.  He has a chronic indwelling Foley catheter.  Daughter thinks they might have been blood in patient's urine yesterday, patient confirms this, that this was about 3 days ago, small quantity and was a one-time episode.  He reports pain with Foley catheter, and his pain has improved since his catheter was changed today.  Reports he has had chronic indwelling Foley catheter for about 2 months now for inability to void. Patient denies difficulty breathing or cough, no chest pain, vomiting or loose stools no abdominal pain. Patient was seen by his oncologist yesterday, WBC was 0.8, was told to come to the ED if he had fevers.  Was prescribed Bactrim which also started today for urinary tract infection.  On EMS arrival today patient's O2 sats were 88% on room air, improved on 2 L O2.  He denies Covid positive contacts.  Recent hospitalization 10/9-10/11 for malignancy related hypercalcemia.   10/21: Patient has been admitted with submassive bilateral PE and has been started on IV heparin and is currently on 2 L nasal cannula oxygen.  He is additionally noted to have febrile neutropenia and has been started on IV cefepime, Flagyl and fluconazole.  Oncology consultation pending.  Assessment & Plan:   Active Problems:   Febrile neutropenia (HCC)   PE (pulmonary thromboembolism) (HCC)   Febrile neutropenia -Patient noted to have temperature 101.3 F in ED -Noted to have some leukocytes with Foley catheter, but this appears to be more pyuria and not an actual infection,  Foley catheter has been changed -Continue IV cefepime, Flagyl, and fluconazole -Blood cultures collected and pending as well as urine culture -Appreciate oncology evaluation -Repeat CBC in a.m.  Submassive bilateral PE with saddle embolus -Currently on 2 L oxygen with no increased work of breathing noted -We will require 5 days of IV heparin -Monitor CBC closely -2D echocardiogram ordered  Acute on chronic anemia -1 unit PRBC transfused with appropriate hemoglobin increased from 6.6-7.7 -No overt bleeding identified -Continue IV Protonix 40 mg daily  B-cell lymphoma-currently on chemotherapy -Patient received Neupogen on 08/22/2019 -Appreciate follow-up with Dr. Delton Coombes while hospitalized  Type 2 diabetes -Recent hemoglobin A1c 5.6% -Continue SSI and hold home Metformin  BPH -With chronic indwelling Foley catheter which has been changed -Follow urine cultures  Mild hypokalemia -Replete orally and recheck in a.m. along with magnesium  Generalized tremors -Home Mysoline, Inderal, and topiramate  Hypertension-elevated -Continue lisinopril and propranolol -Labetalol as needed for severe elevations   DVT prophylaxis: Lovenox Code Status: Full Family Communication: We will plan to update son Disposition Plan: Continue IV antibiotics with oncology evaluation pending.  Maintain on IV heparin for at least 5 days.  2D echocardiogram.   Consultants:   Oncology  Procedures:   None  Antimicrobials:  Anti-infectives (From admission, onward)   Start     Dose/Rate Route Frequency Ordered Stop   08/27/19 1500  ceFEPIme (MAXIPIME) 2 g in sodium chloride 0.9 % 100 mL IVPB     2 g 200 mL/hr over 30 Minutes Intravenous Every 24 hours 08/26/19 1413  08/26/19 1900  fluconazole (DIFLUCAN) IVPB 400 mg     400 mg 100 mL/hr over 120 Minutes Intravenous Every 24 hours 08/26/19 1754     08/26/19 1800  metroNIDAZOLE (FLAGYL) IVPB 500 mg     500 mg 100 mL/hr over 60 Minutes  Intravenous Every 8 hours 08/26/19 1705     08/26/19 1715  metroNIDAZOLE (FLAGYL) IVPB 500 mg  Status:  Discontinued     500 mg 100 mL/hr over 60 Minutes Intravenous Every 8 hours 08/26/19 1705 08/26/19 1705   08/26/19 1345  ceFEPIme (MAXIPIME) 2 g in sodium chloride 0.9 % 100 mL IVPB     2 g 200 mL/hr over 30 Minutes Intravenous  Once 08/26/19 1335 08/26/19 1441       Subjective: Patient seen and evaluated today with no new acute complaints or concerns. No acute concerns or events noted overnight.  He denies any significant chest pain or shortness of breath.  His temperature is starting to come down to 99.5 F this morning.  Objective: Vitals:   08/27/19 0715 08/27/19 0730 08/27/19 0745 08/27/19 0749  BP: (!) 174/80 (!) 159/82 (!) 161/79   Pulse: 91 94 93   Resp: (!) 21 (!) 22 18   Temp:    99.5 F (37.5 C)  TempSrc:    Oral  SpO2: 98% 97% 100%   Weight:      Height:        Intake/Output Summary (Last 24 hours) at 08/27/2019 0817 Last data filed at 08/26/2019 2347 Gross per 24 hour  Intake 1586 ml  Output -  Net 1586 ml   Filed Weights   08/26/19 1309  Weight: 81.6 kg    Examination:  General exam: Appears calm and comfortable  Respiratory system: Clear to auscultation. Respiratory effort normal.  Currently on 2 L nasal cannula oxygen. Cardiovascular system: S1 & S2 heard, RRR. No JVD, murmurs, rubs, gallops or clicks. No pedal edema. Gastrointestinal system: Abdomen is nondistended, soft and nontender. No organomegaly or masses felt. Normal bowel sounds heard. Central nervous system: Alert and oriented. No focal neurological deficits. Extremities: Symmetric 5 x 5 power. Skin: No rashes, lesions or ulcers Psychiatry: Judgement and insight appear normal. Mood & affect appropriate.     Data Reviewed: I have personally reviewed following labs and imaging studies  CBC: Recent Labs  Lab 08/21/19 0816 08/25/19 0947 08/26/19 1330 08/27/19 0412  WBC 3.0* 0.8*  0.3* 0.3*  NEUTROABS 1.3* 0.1* 0.0*  --   HGB 8.4* 8.6* 6.6* 7.7*  HCT 27.2* 28.0* 21.3* 24.6*  MCV 103.0* 103.3* 101.9* 99.6  PLT 303 276 143* [id number]*   Basic Metabolic Panel: Recent Labs  Lab 08/21/19 0816 08/25/19 0947 08/26/19 1330 08/27/19 0412  NA 136 137 130* 135  K 3.3* 4.0 3.5 3.2*  CL 104 103 102 105  CO2 22 24 21* 21*  GLUCOSE 189* 157* 176* 170*  BUN 20 39* 31* 26*  CREATININE 1.17 1.20 1.19 1.18  CALCIUM 11.2* 9.6 8.3* 8.3*  MG 1.8 2.1  --   --   PHOS 2.0* 3.0  --   --    GFR: Estimated Creatinine Clearance: 57.6 mL/min (by C-G formula based on SCr of 1.18 mg/dL). Liver Function Tests: Recent Labs  Lab 08/21/19 0816 08/25/19 0947 08/26/19 1330  AST 25 20 23   ALT 17 18 19   ALKPHOS 67 63 60  BILITOT 0.4 0.5 0.4  PROT 6.5 6.6 5.7*  ALBUMIN 2.6* 2.7* 2.3*   No results for  input(s): LIPASE, AMYLASE in the last 168 hours. No results for input(s): AMMONIA in the last 168 hours. Coagulation Profile: Recent Labs  Lab 08/26/19 1330  INR 1.4*   Cardiac Enzymes: No results for input(s): CKTOTAL, CKMB, CKMBINDEX, TROPONINI in the last 168 hours. BNP (last 3 results) No results for input(s): PROBNP in the last 8760 hours. HbA1C: No results for input(s): HGBA1C in the last 72 hours. CBG: Recent Labs  Lab 08/27/19 0129 08/27/19 0745  GLUCAP 147* 141*   Lipid Profile: No results for input(s): CHOL, HDL, LDLCALC, TRIG, CHOLHDL, LDLDIRECT in the last 72 hours. Thyroid Function Tests: No results for input(s): TSH, T4TOTAL, FREET4, T3FREE, THYROIDAB in the last 72 hours. Anemia Panel: No results for input(s): VITAMINB12, FOLATE, FERRITIN, TIBC, IRON, RETICCTPCT in the last 72 hours. Sepsis Labs: Recent Labs  Lab 08/26/19 1330 08/26/19 1453  LATICACIDVEN 1.3 0.9    Recent Results (from the past 240 hour(s))  Blood Culture (routine x 2)     Status: None (Preliminary result)   Collection Time: 08/26/19  1:36 PM   Specimen: Left Antecubital; Blood   Result Value Ref Range Status   Specimen Description LEFT ANTECUBITAL  Final   Special Requests   Final    BOTTLES DRAWN AEROBIC AND ANAEROBIC Blood Culture adequate volume   Culture   Final    NO GROWTH < 24 HOURS Performed at [facility], [address] [place], [place] [address]    Report Status PENDING  Incomplete  SARS CORONAVIRUS 2 (TAT 6-24 HRS) Nasopharyngeal Nasopharyngeal Swab     Status: None   Collection Time: 08/26/19  2:00 PM   Specimen: Nasopharyngeal Swab  Result Value Ref Range Status   SARS Coronavirus 2 NEGATIVE NEGATIVE Final    Comment: (NOTE) SARS-CoV-2 target nucleic acids are NOT DETECTED. The SARS-CoV-2 RNA is generally detectable in upper and lower respiratory specimens during the acute phase of infection. Negative results do not preclude SARS-CoV-2 infection, do not rule out co-infections with other pathogens, and should not be used as the sole basis for treatment or other patient management decisions. Negative results must be combined with clinical observations, patient history, and epidemiological information. The expected result is Negative. Fact Sheet for Patients: SugarRoll.be Fact Sheet for Healthcare Providers: https://www.woods-mathews.com/ This test is not yet approved or cleared by the [place] FDA and  has been authorized for detection and/or diagnosis of SARS-CoV-2 by FDA under an Emergency Use Authorization (EUA). This EUA will remain  in effect (meaning this test can be used) for the duration of the COVID-19 declaration under Section 56 4(b)(1) of the Act, 21 U.S.C. section 360bbb-3(b)(1), unless the authorization is terminated or revoked sooner. Performed at [facility], [place] [address] [place], [place] [address]   Blood Culture (routine x 2)     Status: None (Preliminary result)   Collection Time: 08/26/19  2:01 PM   Specimen: BLOOD LEFT HAND  Result Value Ref Range Status    Specimen Description BLOOD LEFT HAND  Final   Special Requests   Final    BOTTLES DRAWN AEROBIC AND ANAEROBIC Blood Culture adequate volume   Culture   Final    NO GROWTH < 24 HOURS Performed at [facility], [address], [place], [place] [address]    Report Status PENDING  Incomplete  MRSA PCR Screening     Status: None   Collection Time: 08/26/19  2:10 PM   Specimen: Nasopharyngeal  Result Value Ref Range Status  MRSA by PCR NEGATIVE NEGATIVE Final    Comment:        The GeneXpert MRSA Assay (FDA approved for NASAL specimens only), is one component of a comprehensive MRSA colonization surveillance program. It is not intended to diagnose MRSA infection nor to guide or monitor treatment for MRSA infections. Performed at [facility], [address] [place], [place] [address]          Radiology Studies: Ct Angio Chest Pe W And/or Wo Contrast  Result Date: 08/26/2019 CLINICAL DATA:  Fever hypoxia not feeling well EXAM: CT ANGIOGRAPHY CHEST WITH CONTRAST TECHNIQUE: Multidetector CT imaging of the chest was performed using the standard protocol during bolus administration of intravenous contrast. Multiplanar CT image reconstructions and MIPs were obtained to evaluate the vascular anatomy. CONTRAST:  126mL OMNIPAQUE IOHEXOL 350 MG/ML SOLN COMPARISON:  Chest x-ray 08/26/2019, PET CT 08/07/2019, CT chest 07/25/2019 FINDINGS: Cardiovascular: Satisfactory opacification of the pulmonary arteries to the segmental level. Small saddle embolus within the distal left pulmonary artery, with extension of thrombus into left upper and lower lobe segmental and subsegmental vessels. Small nonocclusive thrombi within right upper, right middle, and right lower lobe segmental and subsegmental vessels. RV LV ratio is 1.0. Nonaneurysmal aorta with mild aortic atherosclerosis. No dissection. Normal heart size. Coronary vascular calcifications. No large pericardial effusion Mediastinum/Nodes: Midline  trachea. No thyroid mass. Scattered calcifications in the right lobe. Esophagus within normal limits. Similar to decreased size of mediastinal lymph nodes. Periesophageal lymph node measures 1 cm compared with 1.1 cm previously. Lungs/Pleura: Emphysema. No pleural effusion. Small foci of sub pleural density, likely reflecting atelectasis. Upper Abdomen: Incompletely visualized retroperitoneal adenopathy. Musculoskeletal: No acute or suspicious osseous abnormality. Review of the MIP images confirms the above findings. IMPRESSION: 1. Positive for acute bilateral pulmonary emboli, including small saddle embolus in the distal left pulmonary artery. Positive for acute PE with CT evidence of right heart strain (RV/LV Ratio = 1.0) consistent with at least submassive (intermediate risk) PE. The presence of right heart strain has been associated with an increased risk of morbidity and mortality. Please activate Code PE by paging [phone]. 2. Emphysema 3. Similar appearance of small mediastinal nodes. Incompletely visualized retroperitoneal adenopathy in the abdomen Critical Value/emergent results were called by telephone at the time of interpretation on 08/26/2019 at 6:20 pm to provider Dr. Bethena Roys, who verbally acknowledged these results. Aortic Atherosclerosis (ICD10-I70.0) and Emphysema (ICD10-J43.9). Electronically Signed   By: Donavan Foil M.D.   On: 08/26/2019 18:21   Dg Chest Port 1 View  Result Date: 08/26/2019 CLINICAL DATA:  Hypoxia EXAM: PORTABLE CHEST 1 VIEW COMPARISON:  08/15/2019 FINDINGS: Left chest wall port is again noted. Cardiac shadow is stable. The lungs are well aerated bilaterally. No bony abnormality is noted. IMPRESSION: No active disease. Electronically Signed   By: Inez Catalina M.D.   On: 08/26/2019 13:59        Scheduled Meds: . allopurinol  300 mg Oral Daily  . calcitRIOL  0.5 mcg Oral Daily  . citalopram  20 mg Oral QHS  . folic acid  1 mg Oral Daily  .  insulin aspart  0-5 Units Subcutaneous QHS  . insulin aspart  0-9 Units Subcutaneous TID WC  . lisinopril  20 mg Oral Daily  . pantoprazole (PROTONIX) IV  40 mg Intravenous Q24H  . primidone  75 mg Oral Q8H  . propranolol  40 mg Oral BID  . topiramate  200 mg Oral QHS   Continuous Infusions: .  ceFEPime (MAXIPIME) IV    . fluconazole (DIFLUCAN) IV Stopped (08/26/19 2342)  . heparin 1,300 Units/hr (08/27/19 0631)  . metronidazole Stopped (08/27/19 0355)     LOS: 1 day    Time spent: 30 minutes    Azriel Dancy Darleen Crocker, DO [facility] Pager [phone]  If 7PM-7AM, please contact night-coverage [email or web address] Password [facility] 08/27/2019, 8:17 AM

## 2019-08-27 NOTE — Progress Notes (Signed)
*  PRELIMINARY RESULTS* Echocardiogram 2D Echocardiogram has been performed.  Daniel Richards 08/27/2019, 12:40 PM

## 2019-08-27 NOTE — Progress Notes (Signed)
ANTICOAGULATION CONSULT NOTE - [place] for Heparin--> argatroban dosing Indication: pulmonary embolus  Allergies  Allergen Reactions  . Vancomycin Hives    Patient received Benadryl to treat the hives    Patient Measurements: Height: 5\' 11"  (180.3 cm) Weight: 180 lb (81.6 kg) IBW/kg (Calculated) : 75.3 Heparin Dosing Weight: total body weight  Vital Signs: Temp: 99.5 F (37.5 C) (10/21 0749) Temp Source: Oral (10/21 0749) BP: 136/61 (10/21 0930) Pulse Rate: 87 (10/21 0930)  Labs: Recent Labs    08/25/19 0947 08/26/19 1330 08/27/19 0412  HGB 8.6* 6.6* 7.7*  HCT 28.0* 21.3* 24.6*  PLT 276 143* 135*  APTT  --  29  --   LABPROT  --  16.6*  --   INR  --  1.4*  --   HEPARINUNFRC  --   --  <0.10*  CREATININE 1.20 1.19 1.18    Estimated Creatinine Clearance: 57.6 mL/min (by C-G formula based on SCr of 1.18 mg/dL).   Medical History: Past Medical History:  Diagnosis Date  . Anxiety and depression   . Cancer ([place])    lymphoma  . Diabetes mellitus 06/09/2011   pt taking metformin  . DJD of shoulder    right   . Hyperlipidemia   . Hypertension   . Migraines   . Port-A-Cath in place 08/13/2019  . Tremor   . 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 Noted to be hypoxic with elevated d-dimer. CTa shows acute bilateral PE, including small saddle embolus with evidence of right heart strain.  Pharmacy consulted to dose IV heparin, but platelet count has decreased from 276K on 08-25-19 to 135K this morning.    08-27-19 AM UPDATE  Platelets have decreased by ~50% from baseline   Goal of Therapy:   Target aPTT range of 50-90 seconds (1.5-3x control)     Plan:   Discontinue  heparin infusion now  Start argatroban infusion at 0.63mcg/kg/min-->2.45mL/hr (ICU patient)  Check aPTT at 1215 today and q2 hours until two therapeutic aPTTs obtained  Check daily CBC  and aPTT  Assess patient for hepatic function changes and further thromboembolic conmplications  Monitor platelets closely for signs/symptoms of bleeding and continued thrombocytopenia  Despina Pole, Pharm. D. Clinical Pharmacist 08/27/2019 10:47 AM

## 2019-08-28 LAB — MAGNESIUM: Magnesium: 1.8 mg/dL (ref 1.7–2.4)

## 2019-08-28 LAB — BASIC METABOLIC PANEL
Anion gap: 11 (ref 5–15)
BUN: 19 mg/dL (ref 8–23)
CO2: 19 mmol/L — ABNORMAL LOW (ref 22–32)
Calcium: 8.4 mg/dL — ABNORMAL LOW (ref 8.9–10.3)
Chloride: 106 mmol/L (ref 98–111)
Creatinine, Ser: 1.08 mg/dL (ref 0.61–1.24)
GFR calc Af Amer: >60 mL/min (ref >60)
GFR calc non Af Amer: >60 mL/min (ref >60)
Glucose, Bld: 114 mg/dL — ABNORMAL HIGH (ref 70–99)
Potassium: 3.2 mmol/L — ABNORMAL LOW (ref 3.5–5.1)
Sodium: 136 mmol/L (ref 135–145)

## 2019-08-28 LAB — GLUCOSE, CAPILLARY
Glucose-Capillary: 107 mg/dL — ABNORMAL HIGH (ref 70–99)
Glucose-Capillary: 156 mg/dL — ABNORMAL HIGH (ref 70–99)
Glucose-Capillary: 195 mg/dL — ABNORMAL HIGH (ref 70–99)
Glucose-Capillary: 181 mg/dL — ABNORMAL HIGH (ref 70–99)

## 2019-08-28 LAB — URINE CULTURE: Culture: >=100000 — AB

## 2019-08-28 LAB — CBC
HCT: 23.4 % — ABNORMAL LOW (ref 39.0–52.0)
Hemoglobin: 7.5 g/dL — ABNORMAL LOW (ref 13.0–17.0)
MCH: 31.8 pg (ref 26.0–34.0)
MCHC: 32.1 g/dL (ref 30.0–36.0)
MCV: 99.2 fL (ref 80.0–100.0)
Platelets: 115 10*3/uL — ABNORMAL LOW (ref 150–400)
RBC: 2.36 MIL/uL — ABNORMAL LOW (ref 4.22–5.81)
RDW: 15 % (ref 11.5–15.5)
WBC: 0.4 10*3/uL — CL (ref 4.0–10.5)
nRBC: 0 % (ref 0.0–0.2)

## 2019-08-28 LAB — APTT: aPTT: 76 seconds — ABNORMAL HIGH (ref 24–36)

## 2019-08-28 LAB — HEPARIN INDUCED PLATELET AB (HIT ANTIBODY): Heparin Induced Plt Ab: 0.053 OD (ref 0.000–0.400)

## 2019-08-28 MED ORDER — GABAPENTIN 300 MG PO CAPS
600.0000 mg | ORAL_CAPSULE | Freq: Three times a day (TID) | ORAL | Status: DC
  Administered 2019-08-28 – 2019-08-31 (×9): 600 mg via ORAL
  Filled 2019-08-28 (×10): qty 2

## 2019-08-28 MED ORDER — SODIUM CHLORIDE 0.9 % IV SOLN
2.0000 g | Freq: Three times a day (TID) | INTRAVENOUS | Status: DC
  Administered 2019-08-28 – 2019-08-29 (×4): 2 g via INTRAVENOUS
  Filled 2019-08-28 (×4): qty 2

## 2019-08-28 MED ORDER — OXYCODONE HCL 5 MG PO TABS
5.0000 mg | ORAL_TABLET | Freq: Four times a day (QID) | ORAL | Status: DC | PRN
  Administered 2019-08-28 – 2019-08-31 (×7): 5 mg via ORAL
  Filled 2019-08-28 (×7): qty 1

## 2019-08-28 MED ORDER — POTASSIUM CHLORIDE CRYS ER 20 MEQ PO TBCR
40.0000 meq | EXTENDED_RELEASE_TABLET | Freq: Once | ORAL | Status: AC
  Administered 2019-08-28: 40 meq via ORAL
  Filled 2019-08-28: qty 2

## 2019-08-28 NOTE — Progress Notes (Addendum)
Pharmacy Antibiotic Note  Daniel Richards is a 73 y.o. male admitted on 08/26/2019 with UTI.  Pharmacy has been consulted for  cefepime and fluconazole dosing. Patient CrCl improved. Tmax 100.7, remains neutropenic. UCX +Proteus mirabilis, awaiting susceptibilities. Submassive bilateral PE with saddle embolus.  Plan: Increase cefepime 2g IV q8h Pharmacy will monitor labs, cultures and patient progress.  Height: 5\' 11"  (180.3 cm) Weight: 198 lb 3.1 oz (89.9 kg) IBW/kg (Calculated) : 75.3  Temp (24hrs), Avg:99.2 F (37.3 C), Min:98 F (36.7 C), Max:100.7 F (38.2 C)  Recent Labs  Lab 08/21/19 0816 08/25/19 0947 08/26/19 1330 08/26/19 1453 08/27/19 0412 08/28/19 0426  WBC 3.0* 0.8* 0.3*  --  0.3* 0.4*  CREATININE 1.17 1.20 1.19  --  1.18 1.08  LATICACIDVEN  --   --  1.3 0.9  --   --     Estimated Creatinine Clearance: 62.9 mL/min (by C-G formula based on SCr of 1.08 mg/dL).    Allergies  Allergen Reactions  . Vancomycin Hives    Patient received Benadryl to treat the hives    Antimicrobials this admission: cefepime 10/20>>   fluconazole 10/20>>10/22 metronidazole 10/20>>10/22   Microbiology results: 10/20 BCx: ngtd  10/19  UCx: +Proteus >100,000 CFU/ml   Thank you for allowing pharmacy to be a part of this patient's care.  Isac Sarna, BS Pharm D, [place] Clinical Pharmacist Pager [phone] 08/28/2019 7:24 AM

## 2019-08-28 NOTE — Progress Notes (Signed)
ANTICOAGULATION CONSULT NOTE - [place] for Heparin--> argatroban dosing Indication: pulmonary embolus  Allergies  Allergen Reactions  . Vancomycin Hives    Patient received Benadryl to treat the hives    Patient Measurements: Height: 5\' 11"  (180.3 cm) Weight: 198 lb 3.1 oz (89.9 kg) IBW/kg (Calculated) : 75.3 Heparin Dosing Weight: total body weight  Vital Signs: Temp: 98.6 F (37 C) (10/22 0400) Temp Source: Axillary (10/22 0400) BP: 123/60 (10/22 0600) Pulse Rate: 81 (10/22 0600)  Labs: Recent Labs    08/26/19 1330 08/27/19 0412  08/27/19 1258 08/27/19 1458 08/28/19 0426  HGB 6.6* 7.7*  --   --   --  7.5*  HCT 21.3* 24.6*  --   --   --  23.4*  PLT 143* 135*  --   --   --  115*  APTT 29  --    < > 51* 60* 76*  LABPROT 16.6*  --   --   --   --   --   INR 1.4*  --   --   --   --   --   HEPARINUNFRC  --  <0.10*  --   --   --   --   CREATININE 1.19 1.18  --   --   --  1.08   < > = values in this interval not displayed.    Estimated Creatinine Clearance: 62.9 mL/min (by C-G formula based on SCr of 1.08 mg/dL).   Medical History: Past Medical History:  Diagnosis Date  . Anxiety and depression   . Cancer ([place])    lymphoma  . Diabetes mellitus 06/09/2011   pt taking metformin  . DJD of shoulder    right   . Hyperlipidemia   . Hypertension   . Migraines   . Port-A-Cath in place 08/13/2019  . Tremor   . 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 Noted to be hypoxic with elevated d-dimer. CTa shows acute bilateral PE, including small saddle embolus with evidence of right heart strain.  Pharmacy consulted to dose IV heparin, but platelet count has decreased from 276K on 08-25-19 to 135K this morning.    10-21 aPTT is 51>>60 : therapeutic  10/22 aPTT 76: therapeutic  Goal of Therapy:   Target aPTT range of 50-90 seconds (1.5-3x control)    Plan:    Continue argatroban infusion at 0.67mcg/kg/min-->2.45mL/hr (ICU patient)   Check daily CBC and aPTT  Assess patient for hepatic function changes and further thromboembolic conmplications  Monitor platelets closely for signs/symptoms of bleeding and continued thrombocytopenia  Isac Sarna, BS [place], BCPS Clinical Pharmacist Pager [phone] 08/28/2019 7:20 AM

## 2019-08-28 NOTE — Progress Notes (Signed)
PROGRESS NOTE    Daniel Richards  [id number] DOB: [birth year] DOA: 08/26/2019 PCP: Daniel Sender, FNP   Brief Narrative:  Per HPI: Daniel Richards a 73 y.o.malewith medical history significant forB-cell lymphoma on chemotherapy, diabetes mellitus, depression.Patient was brought to the ED via EMS withreports a fever up to 104 recorded by EMS. Daughter reported over the past week patient has been feeling unwell. He has a chronic indwelling Foley catheter. Daughter thinks they might have been blood in patient's urine yesterday,patient confirms this,that this was about 3 days ago, smallquantity and was aone-time episode. He reports pain with Foley catheter,and his pain has improved since his catheter was changed today. Reports he has had chronic indwelling Foley catheter for about 2 months now for inability to void. Patient denies difficulty breathing or cough,no chest pain,vomiting or loose stools no abdominal pain. Patient was seen by his oncologist yesterday, WBC was 0.8, was told to come to the ED if he had fevers. Was prescribed Bactrim which also started today for urinary tract infection. On EMS arrival today patient's O2 sats were 88% on room air,improved on 2 L O2.He denies Covid positive contacts.  Recent hospitalization 10/9-10/46for malignancy related hypercalcemia.  10/21: Patient has been admitted with submassive bilateral PE and has been started on IV heparin and is currently on 2 L nasal cannula oxygen.  He is additionally noted to have febrile neutropenia and has been started on IV cefepime, Flagyl and fluconazole.  Oncology consultation pending.  10/22: Patient appears to be doing well this morning and is noted to have Proteus in urine culture.  Will maintain on cefepime for now and await final culture sensitivities.  Discontinue Flagyl and fluconazole.  No further fevers noted this a.m.  Platelet counts are slightly decreased, but mostly stable.   No other acute events noted overnight.  Appreciate oncology evaluation.  Assessment & Plan:   Active Problems:   Febrile neutropenia (HCC)   PE (pulmonary thromboembolism) ([place])   Acute saddle pulmonary embolism with acute cor pulmonale (HCC)   Urinary tract infection associated with indwelling urethral catheter (HCC)   Febrile neutropenia with Proteus UTI -Patient noted to have temperature 101.3 F in ED, but fevers not resolving -Foley catheter changed 2 days ago and is noted to have Proteus UTI -Continue IV cefepime, and discontinue Flagyl and fluconazole -Blood cultures collected and pending  -Urine culture with Proteus -Appreciate oncology evaluation with no new recommendations current -Repeat CBC in a.m.  Submassive bilateral PE with saddle embolus -Currently on 2 L oxygen with no increased work of breathing noted -We will require 5 days of IV argatroban -HIT pending -Monitor CBC closely -2D echocardiogram with LVEF 60-65% no other acute findings  Acute on chronic anemia-stable -1 unit PRBC transfused with appropriate hemoglobin increased from 6.6-7.7 -No overt bleeding identified -Continue IV Protonix 40 mg daily -Follow CBC  B-cell lymphoma-currently on chemotherapy -Patient received Neupogen on 08/22/2019 -Appreciate follow-up with Dr. Delton Coombes while hospitalized  Type 2 diabetes -Recent hemoglobin A1c 5.6% -Continue SSI and hold home Metformin  BPH -With chronic indwelling Foley catheter which has been changed 2 days prior -Follow Proteus sensitivities -We will need outpatient urology follow-up  Mild hypokalemia -Replete orally and recheck in a.m. along with magnesium  Generalized tremors -Home Mysoline, Inderal, and topiramate  Hypertension-controlled -Continue lisinopril and propranolol -Labetalol as needed for severe elevations   DVT prophylaxis: Lovenox Code Status: Full Family Communication:  Son had been at bedside yesterday and  is updated Disposition  Plan: Continue IV cefepime.  Maintain on argatroban for 5 days.  Transfer to telemetry.  We will plan for Eliquis on discharge.   Consultants:   Oncology  Procedures:   2D echocardiogram 10/21 with LVEF 60-65%, no significant RV strain  Antimicrobials:  Anti-infectives (From admission, onward)   Start     Dose/Rate Route Frequency Ordered Stop   08/28/19 1400  ceFEPIme (MAXIPIME) 2 g in sodium chloride 0.9 % 100 mL IVPB     2 g 200 mL/hr over 30 Minutes Intravenous Every 8 hours 08/28/19 0741     08/27/19 1500  ceFEPIme (MAXIPIME) 2 g in sodium chloride 0.9 % 100 mL IVPB  Status:  Discontinued     2 g 200 mL/hr over 30 Minutes Intravenous Every 24 hours 08/26/19 1413 08/28/19 0741   08/26/19 1900  fluconazole (DIFLUCAN) IVPB 400 mg  Status:  Discontinued     400 mg 100 mL/hr over 120 Minutes Intravenous Every 24 hours 08/26/19 1754 08/28/19 0739   08/26/19 1800  metroNIDAZOLE (FLAGYL) IVPB 500 mg  Status:  Discontinued     500 mg 100 mL/hr over 60 Minutes Intravenous Every 8 hours 08/26/19 1705 08/28/19 0739   08/26/19 1715  metroNIDAZOLE (FLAGYL) IVPB 500 mg  Status:  Discontinued     500 mg 100 mL/hr over 60 Minutes Intravenous Every 8 hours 08/26/19 1705 08/26/19 1705   08/26/19 1345  ceFEPIme (MAXIPIME) 2 g in sodium chloride 0.9 % 100 mL IVPB     2 g 200 mL/hr over 30 Minutes Intravenous  Once 08/26/19 1335 08/26/19 1441       Subjective: Patient seen and evaluated today with no new acute complaints or concerns. No acute concerns or events noted overnight.  Objective: Vitals:   08/28/19 0300 08/28/19 0400 08/28/19 0500 08/28/19 0600  BP: 134/60 (!) 148/67 (!) 129/56 123/60  Pulse: 82 87 83 81  Resp: (!) 21 18 15 14   Temp:  98.6 F (37 C)    TempSrc:  Axillary    SpO2: 100% 100% 100% 99%  Weight:   89.9 kg   Height:        Intake/Output Summary (Last 24 hours) at 08/28/2019 0753 Last data filed at 08/28/2019 0700 Gross per 24 hour   Intake 838.03 ml  Output 3000 ml  Net -2161.97 ml   Filed Weights   08/26/19 1309 08/27/19 1027 08/28/19 0500  Weight: 81.6 kg 91.6 kg 89.9 kg    Examination:  General exam: Appears calm and comfortable, mild tremors Respiratory system: Clear to auscultation. Respiratory effort normal.  Continues on 2 L nasal cannula Cardiovascular system: S1 & S2 heard, RRR. No JVD, murmurs, rubs, gallops or clicks. No pedal edema. Gastrointestinal system: Abdomen is nondistended, soft and nontender. No organomegaly or masses felt. Normal bowel sounds heard. Central nervous system: Alert and oriented. No focal neurological deficits. Extremities: Symmetric 5 x 5 power. Skin: No rashes, lesions or ulcers Psychiatry: Judgement and insight appear normal. Mood & affect appropriate.     Data Reviewed: I have personally reviewed following labs and imaging studies  CBC: Recent Labs  Lab 08/21/19 0816 08/25/19 0947 08/26/19 1330 08/27/19 0412 08/28/19 0426  WBC 3.0* 0.8* 0.3* 0.3* 0.4*  NEUTROABS 1.3* 0.1* 0.0*  --   --   HGB 8.4* 8.6* 6.6* 7.7* 7.5*  HCT 27.2* 28.0* 21.3* 24.6* 23.4*  MCV 103.0* 103.3* 101.9* 99.6 99.2  PLT 303 276 143* 135* [id number]*   Basic Metabolic Panel: Recent Labs  Lab  08/21/19 0816 08/25/19 0947 08/26/19 1330 08/27/19 0412 08/28/19 0426  NA 136 137 130* 135 136  K 3.3* 4.0 3.5 3.2* 3.2*  CL 104 103 102 105 106  CO2 22 24 21* 21* 19*  GLUCOSE 189* 157* 176* 170* 114*  BUN 20 39* 31* 26* 19  CREATININE 1.17 1.20 1.19 1.18 1.08  CALCIUM 11.2* 9.6 8.3* 8.3* 8.4*  MG 1.8 2.1  --   --  1.8  PHOS 2.0* 3.0  --   --   --    GFR: Estimated Creatinine Clearance: 62.9 mL/min (by C-G formula based on SCr of 1.08 mg/dL). Liver Function Tests: Recent Labs  Lab 08/21/19 0816 08/25/19 0947 08/26/19 1330  AST 25 20 23   ALT 17 18 19   ALKPHOS 67 63 60  BILITOT 0.4 0.5 0.4  PROT 6.5 6.6 5.7*  ALBUMIN 2.6* 2.7* 2.3*   No results for input(s): LIPASE, AMYLASE in the  last 168 hours. No results for input(s): AMMONIA in the last 168 hours. Coagulation Profile: Recent Labs  Lab 08/26/19 1330  INR 1.4*   Cardiac Enzymes: No results for input(s): CKTOTAL, CKMB, CKMBINDEX, TROPONINI in the last 168 hours. BNP (last 3 results) No results for input(s): PROBNP in the last 8760 hours. HbA1C: No results for input(s): HGBA1C in the last 72 hours. CBG: Recent Labs  Lab 08/27/19 0129 08/27/19 0745 08/27/19 1115 08/27/19 1618 08/27/19 2207  GLUCAP 147* 141* 141* 127* 110*   Lipid Profile: No results for input(s): CHOL, HDL, LDLCALC, TRIG, CHOLHDL, LDLDIRECT in the last 72 hours. Thyroid Function Tests: No results for input(s): TSH, T4TOTAL, FREET4, T3FREE, THYROIDAB in the last 72 hours. Anemia Panel: No results for input(s): VITAMINB12, FOLATE, FERRITIN, TIBC, IRON, RETICCTPCT in the last 72 hours. Sepsis Labs: Recent Labs  Lab 08/26/19 1330 08/26/19 1453  LATICACIDVEN 1.3 0.9    Recent Results (from the past 240 hour(s))  Urine culture     Status: Abnormal (Preliminary result)   Collection Time: 08/25/19 10:46 AM   Specimen: Urine, Catheterized  Result Value Ref Range Status   Specimen Description   Final    URINE, CATHETERIZED Performed at [facility], [address] [place], [place] [address]    Special Requests   Final    Immunocompromised Performed at [facility], [address], [place], [place] [address]    Culture (A)  Final    >=100,000 COLONIES/mL PROTEUS MIRABILIS SUSCEPTIBILITIES TO FOLLOW Performed at [facility], [place] [address] [place], [place] [address]    Report Status PENDING  Incomplete  Urine culture     Status: Abnormal (Preliminary result)   Collection Time: 08/26/19  1:30 PM   Specimen: In/Out Cath Urine  Result Value Ref Range Status   Specimen Description   Final    IN/OUT CATH URINE Performed at [facility], [address], [place], [place] [address]    Special Requests   Final    NONE  Performed at [facility], [address] [place], [place] [address]    Culture (A)  Final    90,000 COLONIES/mL Daniel Richards NEGATIVE RODS IDENTIFICATION AND SUSCEPTIBILITIES TO FOLLOW Performed at [facility], [place] [address] [place],  [address]    Report Status PENDING  Incomplete  Blood Culture (routine x 2)     Status: None (Preliminary result)   Collection Time: 08/26/19  1:36 PM   Specimen: Left Antecubital; Blood  Result Value Ref Range Status   Specimen Description LEFT ANTECUBITAL  Final   Special Requests   Final    BOTTLES DRAWN AEROBIC AND ANAEROBIC Blood Culture adequate volume   Culture   Final    NO GROWTH < 24 HOURS Performed at [facility], [address], [place], [place] [address]    Report Status PENDING  Incomplete  SARS CORONAVIRUS 2 (TAT 6-24 HRS) Nasopharyngeal Nasopharyngeal Swab     Status: None   Collection Time: 08/26/19  2:00 PM   Specimen: Nasopharyngeal Swab  Result Value Ref Range Status   SARS Coronavirus 2 NEGATIVE NEGATIVE Final    Comment: (NOTE) SARS-CoV-2 target nucleic acids are NOT DETECTED. The SARS-CoV-2 RNA is generally detectable in upper and lower respiratory specimens during the acute phase of infection. Negative results do not preclude SARS-CoV-2 infection, do not rule out co-infections with other pathogens, and should not be used as the sole basis for treatment or other patient management decisions. Negative results must be combined with clinical observations, patient history, and epidemiological information. The expected result is Negative. Fact Sheet for Patients: SugarRoll.be Fact Sheet for Healthcare Providers: https://www.woods-mathews.com/ This test is not yet approved or cleared by the [place] FDA and  has been authorized for detection and/or diagnosis of SARS-CoV-2 by FDA under an Emergency Use Authorization (EUA). This EUA will remain  in effect (meaning this test  can be used) for the duration of the COVID-19 declaration under Section 56 4(b)(1) of the Act, 21 U.S.C. section 360bbb-3(b)(1), unless the authorization is terminated or revoked sooner. Performed at [facility], [place] [address], [place], [place] [address]   Blood Culture (routine x 2)     Status: None (Preliminary result)   Collection Time: 08/26/19  2:01 PM   Specimen: BLOOD LEFT HAND  Result Value Ref Range Status   Specimen Description BLOOD LEFT HAND  Final   Special Requests   Final    BOTTLES DRAWN AEROBIC AND ANAEROBIC Blood Culture adequate volume   Culture   Final    NO GROWTH < 24 HOURS Performed at [facility], [address] [place], [place] [address]    Report Status PENDING  Incomplete  MRSA PCR Screening     Status: None   Collection Time: 08/26/19  2:10 PM   Specimen: Nasopharyngeal  Result Value Ref Range Status   MRSA by PCR NEGATIVE NEGATIVE Final    Comment:        The GeneXpert MRSA Assay (FDA approved for NASAL specimens only), is one component of a comprehensive MRSA colonization surveillance program. It is not intended to diagnose MRSA infection nor to guide or monitor treatment for MRSA infections. Performed at [facility], [address] [place], [place] [address]          Radiology Studies: Ct Angio Chest Pe W And/or Wo Contrast  Result Date: 08/26/2019 CLINICAL DATA:  Fever hypoxia not feeling well EXAM: CT ANGIOGRAPHY CHEST WITH CONTRAST TECHNIQUE: Multidetector CT imaging of the chest was performed using the standard protocol during bolus administration of intravenous contrast. Multiplanar CT image reconstructions and MIPs were obtained to evaluate the vascular anatomy. CONTRAST:  135mL OMNIPAQUE IOHEXOL 350 MG/ML SOLN COMPARISON:  Chest x-ray 08/26/2019, PET CT 08/07/2019, CT chest 07/25/2019 FINDINGS: Cardiovascular: Satisfactory opacification of the pulmonary arteries to the segmental level. Small saddle embolus within the  distal left pulmonary artery, with extension of thrombus into left upper and lower lobe segmental and subsegmental vessels. Small nonocclusive thrombi within right upper, right middle, and right lower lobe segmental and  subsegmental vessels. RV LV ratio is 1.0. Nonaneurysmal aorta with mild aortic atherosclerosis. No dissection. Normal heart size. Coronary vascular calcifications. No large pericardial effusion Mediastinum/Nodes: Midline trachea. No thyroid mass. Scattered calcifications in the right lobe. Esophagus within normal limits. Similar to decreased size of mediastinal lymph nodes. Periesophageal lymph node measures 1 cm compared with 1.1 cm previously. Lungs/Pleura: Emphysema. No pleural effusion. Small foci of sub pleural density, likely reflecting atelectasis. Upper Abdomen: Incompletely visualized retroperitoneal adenopathy. Musculoskeletal: No acute or suspicious osseous abnormality. Review of the MIP images confirms the above findings. IMPRESSION: 1. Positive for acute bilateral pulmonary emboli, including small saddle embolus in the distal left pulmonary artery. Positive for acute PE with CT evidence of right heart strain (RV/LV Ratio = 1.0) consistent with at least submassive (intermediate risk) PE. The presence of right heart strain has been associated with an increased risk of morbidity and mortality. Please activate Code PE by paging [phone]. 2. Emphysema 3. Similar appearance of small mediastinal nodes. Incompletely visualized retroperitoneal adenopathy in the abdomen Critical Value/emergent results were called by telephone at the time of interpretation on 08/26/2019 at 6:20 pm to provider Dr. Bethena Roys, who verbally acknowledged these results. Aortic Atherosclerosis (ICD10-I70.0) and Emphysema (ICD10-J43.9). Electronically Signed   By: Donavan Foil M.D.   On: 08/26/2019 18:21   Dg Chest Port 1 View  Result Date: 08/26/2019 CLINICAL DATA:  Hypoxia EXAM: PORTABLE CHEST 1  VIEW COMPARISON:  08/15/2019 FINDINGS: Left chest wall port is again noted. Cardiac shadow is stable. The lungs are well aerated bilaterally. No bony abnormality is noted. IMPRESSION: No active disease. Electronically Signed   By: Inez Catalina M.D.   On: 08/26/2019 13:59        Scheduled Meds: . allopurinol  300 mg Oral Daily  . calcitRIOL  0.5 mcg Oral Daily  . Chlorhexidine Gluconate Cloth  6 each Topical Daily  . citalopram  20 mg Oral QHS  . folic acid  1 mg Oral Daily  . insulin aspart  0-5 Units Subcutaneous QHS  . insulin aspart  0-9 Units Subcutaneous TID WC  . lisinopril  20 mg Oral Daily  . pantoprazole (PROTONIX) IV  40 mg Intravenous Q24H  . potassium chloride  40 mEq Oral Once  . primidone  75 mg Oral Q8H  . propranolol  40 mg Oral BID  . topiramate  200 mg Oral QHS   Continuous Infusions: . argatroban 0.5 mcg/kg/min (08/27/19 2230)  . ceFEPime (MAXIPIME) IV       LOS: 2 days    Time spent: 30 minutes    Kaytlan Behrman Darleen Crocker, DO [facility] Pager [phone]  If 7PM-7AM, please contact night-coverage [email or web address] Password [facility] 08/28/2019, 7:53 AM

## 2019-08-28 NOTE — TOC Initial Note (Signed)
Transition of Care [facility]) - Initial/Assessment Note    Patient Details  Name: Daniel Richards MRN: [id number] Date of Birth: July 04, 1946  Transition of Care [facility]) CM/SW Contact:    Daniel Gully, LCSW Phone Number: 08/28/2019, 4:29 PM  Clinical Narrative:                 Patient from home with son, Daniel Richards, who provided history. Admitted for febrile neutropenia. Active with [facility], RN. Uses wheelchair, needs family's assistance to transfer in and out of wheelchair. Has chronic foley. He does not have any issues getting to appointments nor getting medications.   TOC will follow and address needs as they arise.    Expected Discharge Plan: [place] Barriers to Discharge: Continued Medical Work up   Patient Goals and CMS Choice Patient states their goals for this hospitalization and ongoing recovery are:: To return to baseline.      Expected Discharge Plan and Services Expected Discharge Plan: [place] arrangements for the past 2 months: Single Family Home                                      Prior Living Arrangements/Services Living arrangements for the past 2 months: Single Family Home Lives with:: Adult Children Patient language and need for interpreter reviewed:: Yes Do you feel safe going back to the place where you live?: Yes      Need for Family Participation in Patient Care: Yes (Comment) Care giver support system in place?: Yes (comment) Current home services: DME, Home RN Criminal Activity/Legal Involvement Pertinent to Current Situation/Hospitalization: No - Comment as needed  Activities of Daily Living Home Assistive Devices/Equipment: None ADL Screening (condition at time of admission) Patient's cognitive ability adequate to safely complete daily activities?: Yes Is the patient deaf or have difficulty hearing?: No Does the patient have difficulty seeing, even when wearing glasses/contacts?: No Does the  patient have difficulty concentrating, remembering, or making decisions?: No Patient able to express need for assistance with ADLs?: Yes Does the patient have difficulty dressing or bathing?: No Independently performs ADLs?: Yes (appropriate for developmental age) Communication: Independent Dressing (OT): Independent Is this a change from baseline?: Pre-admission baseline Grooming: Independent Is this a change from baseline?: Pre-admission baseline Feeding: Independent Is this a change from baseline?: Pre-admission baseline Bathing: Independent Is this a change from baseline?: Pre-admission baseline Toileting: Independent Is this a change from baseline?: Pre-admission baseline In/Out Bed: Independent Is this a change from baseline?: Pre-admission baseline Walks in Home: Independent Is this a change from baseline?: Pre-admission baseline Does the patient have difficulty walking or climbing stairs?: No Weakness of Legs: Both Weakness of Arms/Hands: None  Permission Sought/Granted            Permission granted to share info w Relationship: Son, Daniel Richards     Emotional Assessment Appearance:: Appears stated age   Affect (typically observed): Appropriate Orientation: : Oriented to Self, Oriented to Place, Oriented to  Time, Oriented to Situation Alcohol / Substance Use: Not Applicable Psych Involvement: No (comment)  Admission diagnosis:  Febrile neutropenia ([place]) [D70.9, R50.81] Acute saddle pulmonary embolism with acute cor pulmonale (HCC) [I26.02] Urinary tract infection associated with indwelling urethral catheter, initial encounter ([place]) [id number], N39.0] PE (pulmonary thromboembolism) ([place]) [I26.99] Patient Active Problem List   Diagnosis Date Noted  . Acute saddle pulmonary embolism  with acute cor pulmonale ([place])   . Urinary tract infection associated with indwelling urethral catheter ([place])   . Febrile neutropenia ([place]) 08/26/2019  . PE (pulmonary thromboembolism) ([place])  08/26/2019  . Neutropenia, drug-induced ([place]) 08/20/2019  . Diffuse large B cell lymphoma ([place]) 08/20/2019  . Port-A-Cath in place 08/13/2019  . Anemia of chronic disease 08/07/2019  . Major depression, recurrent, chronic ([place]) 08/07/2019  . BPH without urinary obstruction 08/07/2019  . Aortic atherosclerosis ([place]) 08/07/2019  . Diabetic peripheral neuropathy ([place]) 08/07/2019  . Hypertension associated with type 2 diabetes mellitus ([place]) 08/07/2019  . 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 Odynophagia   . Acute kidney injury ([place])   . Falls frequently   . Generalized abdominal pain   . Goals of care, counseling/discussion   . Palliative care by specialist   . Folate deficiency   . Generalized weakness 07/25/2019  . Intra-abdominal lymphadenopathy 07/25/2019  . Retroperitoneal lymphadenopathy 07/25/2019  . Malnutrition of moderate degree 07/25/2019  . Tremor   . Hypercalcemia 07/24/2019  . B12 deficiency 11/18/2016  . Weakness generalized 11/16/2016  . Altered mental status, unspecified 11/16/2016  . Fracture, rib 11/16/2016  . Weakness 11/16/2016  . New onset of headaches after age 74 09/01/2015  . Morning headache 09/01/2015  . Daytime somnolence 09/01/2015  . Essential tremor 09/01/2015  . Intractable headache 09/01/2015  . Type 2 diabetes, controlled, with peripheral neuropathy ([place]) 06/09/2011   PCP:  Daniel Sender, FNP Pharmacy:   [place], [place] [address] Phone: [phone] Fax: [phone]  [facility] - [place], [place] - [address] [place] [address] Phone: [phone] Fax: [place], [place] [address] Phone: [phone] Fax: [phone]     Social Determinants  of Health (SDOH) Interventions    Readmission Risk Interventions Readmission Risk Prevention Plan 07/30/2019 07/28/2019  Post Dischage Appt Not Complete -  Appt Comments [facility] MD will follow while pt at short term rehab -  Medication Screening - Complete  Transportation Screening - Complete  Some recent data might be hidden

## 2019-08-29 ENCOUNTER — Ambulatory Visit (HOSPITAL_COMMUNITY): Payer: Medicare Other | Admitting: Hematology

## 2019-08-29 ENCOUNTER — Other Ambulatory Visit (HOSPITAL_COMMUNITY): Payer: Medicare Other

## 2019-08-29 DIAGNOSIS — T83511S Infection and inflammatory reaction due to indwelling urethral catheter, sequela: Secondary | ICD-10-CM

## 2019-08-29 LAB — CBC
HCT: 25.7 % — ABNORMAL LOW (ref 39.0–52.0)
Hemoglobin: 8 g/dL — ABNORMAL LOW (ref 13.0–17.0)
MCH: 31.5 pg (ref 26.0–34.0)
MCHC: 31.1 g/dL (ref 30.0–36.0)
MCV: 101.2 fL — ABNORMAL HIGH (ref 80.0–100.0)
Platelets: 142 10*3/uL — ABNORMAL LOW (ref 150–400)
RBC: 2.54 MIL/uL — ABNORMAL LOW (ref 4.22–5.81)
RDW: 15 % (ref 11.5–15.5)
WBC: 1.2 10*3/uL — CL (ref 4.0–10.5)
nRBC: 0 % (ref 0.0–0.2)

## 2019-08-29 LAB — BASIC METABOLIC PANEL
Anion gap: 7 (ref 5–15)
BUN: 19 mg/dL (ref 8–23)
CO2: 22 mmol/L (ref 22–32)
Calcium: 9.3 mg/dL (ref 8.9–10.3)
Chloride: 107 mmol/L (ref 98–111)
Creatinine, Ser: 1.14 mg/dL (ref 0.61–1.24)
GFR calc Af Amer: >60 mL/min (ref >60)
GFR calc non Af Amer: >60 mL/min (ref >60)
Glucose, Bld: 145 mg/dL — ABNORMAL HIGH (ref 70–99)
Potassium: 3.5 mmol/L (ref 3.5–5.1)
Sodium: 136 mmol/L (ref 135–145)

## 2019-08-29 LAB — MAGNESIUM: Magnesium: 2 mg/dL (ref 1.7–2.4)

## 2019-08-29 LAB — URINE CULTURE: Culture: 90000 — AB

## 2019-08-29 LAB — GLUCOSE, CAPILLARY
Glucose-Capillary: 139 mg/dL — ABNORMAL HIGH (ref 70–99)
Glucose-Capillary: 130 mg/dL — ABNORMAL HIGH (ref 70–99)
Glucose-Capillary: 157 mg/dL — ABNORMAL HIGH (ref 70–99)
Glucose-Capillary: 103 mg/dL — ABNORMAL HIGH (ref 70–99)

## 2019-08-29 LAB — APTT: aPTT: 67 seconds — ABNORMAL HIGH (ref 24–36)

## 2019-08-29 MED ORDER — AMOXICILLIN 250 MG PO CAPS
500.0000 mg | ORAL_CAPSULE | Freq: Three times a day (TID) | ORAL | Status: DC
  Administered 2019-08-29 – 2019-08-31 (×7): 500 mg via ORAL
  Filled 2019-08-29 (×8): qty 2

## 2019-08-29 NOTE — Care Management Important Message (Signed)
Important Message  Patient Details  Name: Daniel Richards MRN: [id number] Date of Birth: May 12, 1946   Medicare Important Message Given:  Yes     [name] 08/29/2019, 12:16 PM

## 2019-08-29 NOTE — Progress Notes (Signed)
ANTICOAGULATION CONSULT NOTE - [place] for Heparin--> argatroban dosing Indication: pulmonary embolus  Allergies  Allergen Reactions  . Vancomycin Hives    Patient received Benadryl to treat the hives    Patient Measurements: Height: 5\' 11"  (180.3 cm) Weight: 198 lb 10.2 oz (90.1 kg) IBW/kg (Calculated) : 75.3 Heparin Dosing Weight: total body weight  Vital Signs: Temp: 99.6 F (37.6 C) (10/23 1139) Temp Source: Oral (10/23 1139) BP: 135/69 (10/23 0800)  Labs: Recent Labs    08/27/19 0412  08/27/19 1458 08/28/19 0426 08/29/19 0438  HGB 7.7*  --   --  7.5* 8.0*  HCT 24.6*  --   --  23.4* 25.7*  PLT 135*  --   --  115* 142*  APTT  --    < > 60* 76* 67*  HEPARINUNFRC <0.10*  --   --   --   --   CREATININE 1.18  --   --  1.08 1.14   < > = values in this interval not displayed.    Estimated Creatinine Clearance: 59.6 mL/min (by C-G formula based on SCr of 1.14 mg/dL).   Medical History: Past Medical History:  Diagnosis Date  . Anxiety and depression   . Cancer ([place])    lymphoma  . Diabetes mellitus 06/09/2011   pt taking metformin  . DJD of shoulder    right   . Hyperlipidemia   . Hypertension   . Migraines   . Port-A-Cath in place 08/13/2019  . Tremor   . 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 Noted to be hypoxic with elevated d-dimer. CTa shows acute bilateral PE, including small saddle embolus with evidence of right heart strain.    10/23: HIT negative. Patient's platelet count decreased prior to initiation of heparin and likely due to recent chemotherapy . APTT therapeutic at 67 seconds.  Goal of Therapy:   Target aPTT range of 50-90 seconds (1.5-3x control)    Plan:  Continue argatroban infusion at 0.27mcg/kg/min-->2.45mL/hr (ICU patient). Patient has ~48 hours of treatment left so will just continue since argatroban is therapeutic. Check  daily CBC and aPTT Assess patient for hepatic function changes and further thromboembolic complications   Margot Ables, PharmD Clinical Pharmacist 08/29/2019 2:43 PM

## 2019-08-29 NOTE — Progress Notes (Signed)
PROGRESS NOTE    Daniel Richards  [id number] DOB: 20-Dec-1945 DOA: 08/26/2019 PCP: [name], FNP   Brief Narrative:  Per HPI: Daniel Richards a 73 y.o.malewith medical history significant forB-cell lymphoma on chemotherapy, diabetes mellitus, depression.Patient was brought to the ED via EMS withreports a fever up to 104 recorded by EMS. Daughter reported over the past week patient has been feeling unwell. He has a chronic indwelling Foley catheter. Daughter thinks they might have been blood in patient's urine yesterday,patient confirms this,that this was about 3 days ago, smallquantity and was aone-time episode. He reports pain with Foley catheter,and his pain has improved since his catheter was changed today. Reports he has had chronic indwelling Foley catheter for about 2 months now for inability to void. Patient denies difficulty breathing or cough,no chest pain,vomiting or loose stools no abdominal pain. Patient was seen by his oncologist yesterday, WBC was 0.8, was told to come to the ED if he had fevers. Was prescribed Bactrim which also started today for urinary tract infection. On EMS arrival today patient's O2 sats were 88% on room air,improved on 2 L O2.He denies Covid positive contacts.  Recent hospitalization 10/9-10/63for malignancy related hypercalcemia.  10/21:Patient has been admitted with submassive bilateral PE and has been started on IV heparin and is currently on 2 L nasal cannula oxygen. He is additionally noted to have febrile neutropenia and has been started on IV cefepime, Flagyl and fluconazole. Oncology consultation pending.  10/22: Patient appears to be doing well this morning and is noted to have Proteus in urine culture.  Will maintain on cefepime for now and await final culture sensitivities.  Discontinue Flagyl and fluconazole.  No further fevers noted this a.m.  Platelet counts are slightly decreased, but mostly  stable.  No other acute events noted overnight.  Appreciate oncology evaluation.  10/23: Patient noted to have urine cultures with gram-negative rods as well as Enterococcus with susceptibilities pending.  He has had Foley catheter change approximately 3 days ago.  Blood cultures with no growth in the last 2 days.  Hemoglobin and platelet counts remain stable.  Assessment & Plan:   Active Problems:   Febrile neutropenia (HCC)   PE (pulmonary thromboembolism) ([place])   Acute saddle pulmonary embolism with acute cor pulmonale (HCC)   Urinary tract infection associated with indwelling urethral catheter (HCC)   Febrile neutropenia with Proteus and Enterococcus UTI -Patient noted to have temperature 101.3 F in ED, but fevers have resolved -Foley catheter changed 2 days ago and is noted to have Proteus UTI -Continue IV cefepime and follow sensitivities -Blood cultures collected and pending , no growth in the last 2 days -Urine culture with Proteus and Enterococcus -Appreciate oncology evaluation with no new recommendations currently -Repeat CBC in a.m.  Submassive bilateral PE with saddle embolus -Currently on 2 L oxygen with no increased work of breathing noted -We will require 5 days of IV argatroban, currently day 3 of 5 -HIT negative with thrombocytopenia likely secondary to recent chemotherapy.  We will plan to finish course with argatroban. -Monitor CBC closely -2D echocardiogram with LVEF 60-65% no other acute findings  Acute on chronic anemia-stable -1 unit PRBC transfused with appropriate hemoglobin increased from 6.6-7.7 -No overt bleeding identified -Continue IV Protonix 40 mg daily -Follow CBC  B-cell lymphoma-currently on chemotherapy -Patient received Neupogen on 08/22/2019 -Appreciate follow-up with Dr. Delton Coombes while hospitalized  Type 2 diabetes -Recent hemoglobin A1c 5.6% -Continue SSI and hold home Metformin  BPH -With  chronic indwelling Foley catheter  which has been changed 2 days prior -We will need outpatient urology follow-up  Mild hypokalemia-improved -Replete orally and recheck in a.m.  Generalized tremors -Home Mysoline, Inderal, and topiramate  Hypertension-controlled -Continue lisinopril and propranolol -Labetalol as needed for severe elevations   DVT prophylaxis:Lovenox Code Status:Full Family Communication: Son had been at bedside yesterday and is updated Disposition Plan:Continue IV cefepime and follow sensitivities.  Maintain on argatroban for 5 days.  Transfer to telemetry.  We will plan for Eliquis on discharge.  Plan for home health PT on discharge.   Consultants:  Oncology  Procedures:  2D echocardiogram 10/21 with LVEF 60-65%, no significant RV strain  Antimicrobials:  Anti-infectives (From admission, onward)   Start     Dose/Rate Route Frequency Ordered Stop   08/28/19 1400  ceFEPIme (MAXIPIME) 2 g in sodium chloride 0.9 % 100 mL IVPB     2 g 200 mL/hr over 30 Minutes Intravenous Every 8 hours 08/28/19 0741     08/27/19 1500  ceFEPIme (MAXIPIME) 2 g in sodium chloride 0.9 % 100 mL IVPB  Status:  Discontinued     2 g 200 mL/hr over 30 Minutes Intravenous Every 24 hours 08/26/19 1413 08/28/19 0741   08/26/19 1900  fluconazole (DIFLUCAN) IVPB 400 mg  Status:  Discontinued     400 mg 100 mL/hr over 120 Minutes Intravenous Every 24 hours 08/26/19 1754 08/28/19 0739   08/26/19 1800  metroNIDAZOLE (FLAGYL) IVPB 500 mg  Status:  Discontinued     500 mg 100 mL/hr over 60 Minutes Intravenous Every 8 hours 08/26/19 1705 08/28/19 0739   08/26/19 1715  metroNIDAZOLE (FLAGYL) IVPB 500 mg  Status:  Discontinued     500 mg 100 mL/hr over 60 Minutes Intravenous Every 8 hours 08/26/19 1705 08/26/19 1705   08/26/19 1345  ceFEPIme (MAXIPIME) 2 g in sodium chloride 0.9 % 100 mL IVPB     2 g 200 mL/hr over 30 Minutes Intravenous  Once 08/26/19 1335 08/26/19 1441       Subjective: Patient seen and  evaluated today with no new acute complaints or concerns. No acute concerns or events noted overnight.  He rested better with resumption of his home gabapentin and oxycodone as needed.  Objective: Vitals:   08/29/19 0000 08/29/19 0100 08/29/19 0200 08/29/19 0300  BP: (!) 105/52 (!) 105/52 114/62 (!) 105/59  Pulse:      Resp: 15 15 14 15   Temp:      TempSrc:      SpO2:      Weight:      Height:        Intake/Output Summary (Last 24 hours) at 08/29/2019 0700 Last data filed at 08/29/2019 0304 Gross per 24 hour  Intake 257.67 ml  Output -  Net 257.67 ml   Filed Weights   08/26/19 1309 08/27/19 1027 08/28/19 0500  Weight: 81.6 kg 91.6 kg 89.9 kg    Examination:  General exam: Appears calm and comfortable  Respiratory system: Clear to auscultation. Respiratory effort normal.  Currently on room air. Cardiovascular system: S1 & S2 heard, RRR. No JVD, murmurs, rubs, gallops or clicks. No pedal edema. Gastrointestinal system: Abdomen is nondistended, soft and nontender. No organomegaly or masses felt. Normal bowel sounds heard. Central nervous system: Alert and oriented. No focal neurological deficits. Extremities: Symmetric 5 x 5 power. Skin: No rashes, lesions or ulcers Psychiatry: Judgement and insight appear normal. Mood & affect appropriate.     Data Reviewed: I  have personally reviewed following labs and imaging studies  CBC: Recent Labs  Lab 08/25/19 0947 08/26/19 1330 08/27/19 0412 08/28/19 0426 08/29/19 0438  WBC 0.8* 0.3* 0.3* 0.4* 1.2*  NEUTROABS 0.1* 0.0*  --   --   --   HGB 8.6* 6.6* 7.7* 7.5* 8.0*  HCT 28.0* 21.3* 24.6* 23.4* 25.7*  MCV 103.3* 101.9* 99.6 99.2 101.2*  PLT 276 143* 135* 115* [id number]*   Basic Metabolic Panel: Recent Labs  Lab 08/25/19 0947 08/26/19 1330 08/27/19 0412 08/28/19 0426 08/29/19 0438  NA 137 130* 135 136 136  K 4.0 3.5 3.2* 3.2* 3.5  CL 103 102 105 106 107  CO2 24 21* 21* 19* 22  GLUCOSE 157* 176* 170* 114* 145*  BUN 39*  31* 26* 19 19  CREATININE 1.20 1.19 1.18 1.08 1.14  CALCIUM 9.6 8.3* 8.3* 8.4* 9.3  MG 2.1  --   --  1.8 2.0  PHOS 3.0  --   --   --   --    GFR: Estimated Creatinine Clearance: 59.6 mL/min (by C-G formula based on SCr of 1.14 mg/dL). Liver Function Tests: Recent Labs  Lab 08/25/19 0947 08/26/19 1330  AST 20 23  ALT 18 19  ALKPHOS 63 60  BILITOT 0.5 0.4  PROT 6.6 5.7*  ALBUMIN 2.7* 2.3*   No results for input(s): LIPASE, AMYLASE in the last 168 hours. No results for input(s): AMMONIA in the last 168 hours. Coagulation Profile: Recent Labs  Lab 08/26/19 1330  INR 1.4*   Cardiac Enzymes: No results for input(s): CKTOTAL, CKMB, CKMBINDEX, TROPONINI in the last 168 hours. BNP (last 3 results) No results for input(s): PROBNP in the last 8760 hours. HbA1C: No results for input(s): HGBA1C in the last 72 hours. CBG: Recent Labs  Lab 08/27/19 2207 08/28/19 0755 08/28/19 1132 08/28/19 1608 08/28/19 2206  GLUCAP 110* 107* 156* 195* 181*   Lipid Profile: No results for input(s): CHOL, HDL, LDLCALC, TRIG, CHOLHDL, LDLDIRECT in the last 72 hours. Thyroid Function Tests: No results for input(s): TSH, T4TOTAL, FREET4, T3FREE, THYROIDAB in the last 72 hours. Anemia Panel: No results for input(s): VITAMINB12, FOLATE, FERRITIN, TIBC, IRON, RETICCTPCT in the last 72 hours. Sepsis Labs: Recent Labs  Lab 08/26/19 1330 08/26/19 1453  LATICACIDVEN 1.3 0.9    Recent Results (from the past 240 hour(s))  Urine culture     Status: Abnormal   Collection Time: 08/25/19 10:46 AM   Specimen: Urine, Catheterized  Result Value Ref Range Status   Specimen Description   Final    URINE, CATHETERIZED Performed at [facility], [address] [place], [place] [address]    Special Requests   Final    Immunocompromised Performed at [facility], [address] [place], [place] [address]    Culture >=100,000 COLONIES/mL PROTEUS MIRABILIS (A)  Final   Report Status 08/28/2019 FINAL   Final   Organism ID, Bacteria PROTEUS MIRABILIS (A)  Final      Susceptibility   Proteus mirabilis - MIC*    AMPICILLIN <=2 SENSITIVE Sensitive     CEFAZOLIN <=4 SENSITIVE Sensitive     CEFTRIAXONE <=1 SENSITIVE Sensitive     CIPROFLOXACIN <=0.25 SENSITIVE Sensitive     GENTAMICIN <=1 SENSITIVE Sensitive     IMIPENEM 1 SENSITIVE Sensitive     NITROFURANTOIN 128 RESISTANT Resistant     TRIMETH/SULFA <=20 SENSITIVE Sensitive     AMPICILLIN/SULBACTAM <=2 SENSITIVE Sensitive     PIP/TAZO <=4 SENSITIVE Sensitive     * >=  100,000 COLONIES/mL PROTEUS MIRABILIS  Urine culture     Status: Abnormal (Preliminary result)   Collection Time: 08/26/19  1:30 PM   Specimen: In/Out Cath Urine  Result Value Ref Range Status   Specimen Description   Final    IN/OUT CATH URINE Performed at [facility], [address] [place], [place] [address]    Special Requests   Final    NONE Performed at [facility], [address] [place], [place] [address]    Culture (A)  Final    90,000 COLONIES/mL Lonell Grandchild NEGATIVE RODS 80,000 COLONIES/mL ENTEROCOCCUS FAECALIS SUSCEPTIBILITIES TO FOLLOW Performed at [facility], [place] [address] [place], [place] [address]    Report Status PENDING  Incomplete  Blood Culture (routine x 2)     Status: None (Preliminary result)   Collection Time: 08/26/19  1:36 PM   Specimen: Left Antecubital; Blood  Result Value Ref Range Status   Specimen Description LEFT ANTECUBITAL  Final   Special Requests   Final    BOTTLES DRAWN AEROBIC AND ANAEROBIC Blood Culture adequate volume   Culture   Final    NO GROWTH 2 DAYS Performed at [facility], [address] [place], [place] [address]    Report Status PENDING  Incomplete  SARS CORONAVIRUS 2 (TAT 6-24 HRS) Nasopharyngeal Nasopharyngeal Swab     Status: None   Collection Time: 08/26/19  2:00 PM   Specimen: Nasopharyngeal Swab  Result Value Ref Range Status   SARS Coronavirus 2 NEGATIVE NEGATIVE Final    Comment: (NOTE)  SARS-CoV-2 target nucleic acids are NOT DETECTED. The SARS-CoV-2 RNA is generally detectable in upper and lower respiratory specimens during the acute phase of infection. Negative results do not preclude SARS-CoV-2 infection, do not rule out co-infections with other pathogens, and should not be used as the sole basis for treatment or other patient management decisions. Negative results must be combined with clinical observations, patient history, and epidemiological information. The expected result is Negative. Fact Sheet for Patients: SugarRoll.be Fact Sheet for Healthcare Providers: https://www.woods-mathews.com/ This test is not yet approved or cleared by the [place] FDA and  has been authorized for detection and/or diagnosis of SARS-CoV-2 by FDA under an Emergency Use Authorization (EUA). This EUA will remain  in effect (meaning this test can be used) for the duration of the COVID-19 declaration under Section 56 4(b)(1) of the Act, 21 U.S.C. section 360bbb-3(b)(1), unless the authorization is terminated or revoked sooner. Performed at [facility], [place] [address] [place], [place] [address]   Blood Culture (routine x 2)     Status: None (Preliminary result)   Collection Time: 08/26/19  2:01 PM   Specimen: BLOOD LEFT HAND  Result Value Ref Range Status   Specimen Description BLOOD LEFT HAND  Final   Special Requests   Final    BOTTLES DRAWN AEROBIC AND ANAEROBIC Blood Culture adequate volume   Culture   Final    NO GROWTH 2 DAYS Performed at [facility], [address], [place], [place] [address]    Report Status PENDING  Incomplete  MRSA PCR Screening     Status: None   Collection Time: 08/26/19  2:10 PM   Specimen: Nasopharyngeal  Result Value Ref Range Status   MRSA by PCR NEGATIVE NEGATIVE Final    Comment:        The GeneXpert MRSA Assay (FDA approved for NASAL specimens only), is one component of a  comprehensive MRSA colonization surveillance program. It  is not intended to diagnose MRSA infection nor to guide or monitor treatment for MRSA infections. Performed at [facility], [address] [place], [place] [address]          Radiology Studies: No results found.      Scheduled Meds: . allopurinol  300 mg Oral Daily  . calcitRIOL  0.5 mcg Oral Daily  . Chlorhexidine Gluconate Cloth  6 each Topical Daily  . citalopram  20 mg Oral QHS  . folic acid  1 mg Oral Daily  . gabapentin  600 mg Oral TID  . insulin aspart  0-5 Units Subcutaneous QHS  . insulin aspart  0-9 Units Subcutaneous TID WC  . lisinopril  20 mg Oral Daily  . pantoprazole (PROTONIX) IV  40 mg Intravenous Q24H  . primidone  75 mg Oral Q8H  . propranolol  40 mg Oral BID  . topiramate  200 mg Oral QHS   Continuous Infusions: . argatroban 0.5 mcg/kg/min (08/28/19 1834)  . ceFEPime (MAXIPIME) IV 2 g (08/29/19 0505)     LOS: 3 days    Time spent: 30 minutes    Kiya Eno Darleen Crocker, DO [facility] Pager [phone]  If 7PM-7AM, please contact night-coverage [email or web address] Password [email or web address] 08/29/2019, 7:00 AM

## 2019-08-30 LAB — BASIC METABOLIC PANEL
Anion gap: 8 (ref 5–15)
BUN: 19 mg/dL (ref 8–23)
CO2: 23 mmol/L (ref 22–32)
Calcium: 9.2 mg/dL (ref 8.9–10.3)
Chloride: 106 mmol/L (ref 98–111)
Creatinine, Ser: 1.14 mg/dL (ref 0.61–1.24)
GFR calc Af Amer: >60 mL/min (ref >60)
GFR calc non Af Amer: >60 mL/min (ref >60)
Glucose, Bld: 126 mg/dL — ABNORMAL HIGH (ref 70–99)
Potassium: 3.6 mmol/L (ref 3.5–5.1)
Sodium: 137 mmol/L (ref 135–145)

## 2019-08-30 LAB — GLUCOSE, CAPILLARY
Glucose-Capillary: 124 mg/dL — ABNORMAL HIGH (ref 70–99)
Glucose-Capillary: 106 mg/dL — ABNORMAL HIGH (ref 70–99)
Glucose-Capillary: 131 mg/dL — ABNORMAL HIGH (ref 70–99)
Glucose-Capillary: 126 mg/dL — ABNORMAL HIGH (ref 70–99)

## 2019-08-30 LAB — CBC
HCT: 24.5 % — ABNORMAL LOW (ref 39.0–52.0)
Hemoglobin: 7.7 g/dL — ABNORMAL LOW (ref 13.0–17.0)
MCH: 32 pg (ref 26.0–34.0)
MCHC: 31.4 g/dL (ref 30.0–36.0)
MCV: 101.7 fL — ABNORMAL HIGH (ref 80.0–100.0)
Platelets: 182 10*3/uL (ref 150–400)
RBC: 2.41 MIL/uL — ABNORMAL LOW (ref 4.22–5.81)
RDW: 15.2 % (ref 11.5–15.5)
WBC: 4.9 10*3/uL (ref 4.0–10.5)
nRBC: 0 % (ref 0.0–0.2)

## 2019-08-30 LAB — APTT: aPTT: 53 seconds — ABNORMAL HIGH (ref 24–36)

## 2019-08-30 MED ORDER — ORAL CARE MOUTH RINSE
15.0000 mL | Freq: Two times a day (BID) | OROMUCOSAL | Status: DC
  Administered 2019-08-30 – 2019-08-31 (×3): 15 mL via OROMUCOSAL

## 2019-08-30 MED ORDER — ORAL CARE MOUTH RINSE: 15.0000 mL | Freq: Two times a day (BID) | OROMUCOSAL | Status: DC

## 2019-08-30 NOTE — Progress Notes (Signed)
PROGRESS NOTE    Daniel Richards  [id number] DOB: Jun 14, 1946 DOA: 08/26/2019 PCP: [name], FNP   Brief Narrative:  Per HPI: Daniel Stonebarger Reidis a 73 y.o.malewith medical history significant forB-cell lymphoma on chemotherapy, diabetes mellitus, depression.Patient was brought to the ED via EMS withreports a fever up to 104 recorded by EMS. Daughter reported over the past week patient has been feeling unwell. He has a chronic indwelling Foley catheter. Daughter thinks they might have been blood in patient's urine yesterday,patient confirms this,that this was about 3 days ago, smallquantity and was aone-time episode. He reports pain with Foley catheter,and his pain has improved since his catheter was changed today. Reports he has had chronic indwelling Foley catheter for about 2 months now for inability to void. Patient denies difficulty breathing or cough,no chest pain,vomiting or loose stools no abdominal pain. Patient was seen by his oncologist yesterday, WBC was 0.8, was told to come to the ED if he had fevers. Was prescribed Bactrim which also started today for urinary tract infection. On EMS arrival today patient's O2 sats were 88% on room air,improved on 2 L O2.He denies Covid positive contacts.  Recent hospitalization 10/9-10/40for malignancy related hypercalcemia.  10/21:Patient has been admitted with submassive bilateral PE and has been started on IV heparin and is currently on 2 L nasal cannula oxygen. He is additionally noted to have febrile neutropenia and has been started on IV cefepime, Flagyl and fluconazole. Oncology consultation pending.  10/22:Patient appears to be doing well this morning and is noted to have Proteus in urine culture. Will maintain on cefepime for now and await final culture sensitivities. Discontinue Flagyl and fluconazole. No further fevers noted this a.m. Platelet counts are slightly decreased, but mostly  stable. No other acute events noted overnight. Appreciate oncology evaluation.  10/23: Patient noted to have urine cultures with gram-negative rods as well as Enterococcus with susceptibilities pending.  He has had Foley catheter change approximately 3 days ago.  Blood cultures with no growth in the last 2 days.  Hemoglobin and platelet counts remain stable.  10/24: Patient continues on oral amoxicillin today for coverage of Enterococcus and Proteus UTI.  Hemoglobin and platelet counts remain stable on argatroban with plans to convert to oral anticoagulation by a.m. with potential for discharge at that point with home with home health services.  He is somewhat sleepy today.  Assessment & Plan:   Active Problems:   Febrile neutropenia (HCC)   PE (pulmonary thromboembolism) ([place])   Acute saddle pulmonary embolism with acute cor pulmonale (HCC)   Urinary tract infection associated with indwelling urethral catheter (HCC)   Febrile neutropeniawith Proteus and Enterococcus UTI -Patient noted to have temperature 101.3 F in ED, but fevers have resolved -Foley catheter changed 2 days ago and is noted to have Proteus UTI -IV cefepime discontinued and patient now on oral amoxicillin -Blood cultures collected and pending, no growth in the last 2 days -Urine culture with Proteus and Enterococcus -Appreciate oncology evaluationwith no new recommendations currently -Repeat CBC in a.m.  Submassive bilateral PE with saddle embolus -Currently on 2 L oxygen with no increased work of breathing noted -We will require 5 days of IVargatroban, currently day 4 of 5 -HIT negative with thrombocytopenia likely secondary to recent chemotherapy.  We will plan to finish course with argatroban. -Monitor CBC closely -2D echocardiogramwith LVEF 60-65% no other acute findings  Acute on chronic anemia-stable -1 unit PRBC transfused with appropriate hemoglobin increased from 6.6-7.7 -No overt bleeding  identified -Continue IV Protonix 40 mg daily -Follow CBC  B-cell lymphoma-currently on chemotherapy -Patient received Neupogen on 08/22/2019 -Appreciate follow-up with Dr. Delton Coombes while hospitalized  Type 2 diabetes -Recent hemoglobin A1c 5.6% -Continue SSI and hold home Metformin  BPH -With chronic indwelling Foley catheter which has been changed2 days prior -We will need outpatient urology follow-up  Mild hypokalemia-improved -Replete orally and recheck in a.m.  Generalized tremors -Home Mysoline, Inderal, and topiramate  Hypertension-controlled -Continue lisinopril and propranolol -Labetalol as needed for severe elevations   DVT prophylaxis: Argatroban Code Status:Full Family Communication:None at bedside Disposition Plan: Continue now on oral amoxicillin with argatroban for 1 more day with conversion to oral anticoagulation in a.m.  Anticipate discharge at that point.   Consultants:  Oncology  Procedures:  2D echocardiogram 10/21 with LVEF 60-65%, no significant RV strain  Antimicrobials:  Anti-infectives (From admission, onward)   Start     Dose/Rate Route Frequency Ordered Stop   08/29/19 1800  amoxicillin (AMOXIL) capsule 500 mg     500 mg Oral 3 times daily 08/29/19 1744     08/28/19 1400  ceFEPIme (MAXIPIME) 2 g in sodium chloride 0.9 % 100 mL IVPB  Status:  Discontinued     2 g 200 mL/hr over 30 Minutes Intravenous Every 8 hours 08/28/19 0741 08/29/19 1734   08/27/19 1500  ceFEPIme (MAXIPIME) 2 g in sodium chloride 0.9 % 100 mL IVPB  Status:  Discontinued     2 g 200 mL/hr over 30 Minutes Intravenous Every 24 hours 08/26/19 1413 08/28/19 0741   08/26/19 1900  fluconazole (DIFLUCAN) IVPB 400 mg  Status:  Discontinued     400 mg 100 mL/hr over 120 Minutes Intravenous Every 24 hours 08/26/19 1754 08/28/19 0739   08/26/19 1800  metroNIDAZOLE (FLAGYL) IVPB 500 mg  Status:  Discontinued     500 mg 100 mL/hr over 60 Minutes Intravenous  Every 8 hours 08/26/19 1705 08/28/19 0739   08/26/19 1715  metroNIDAZOLE (FLAGYL) IVPB 500 mg  Status:  Discontinued     500 mg 100 mL/hr over 60 Minutes Intravenous Every 8 hours 08/26/19 1705 08/26/19 1705   08/26/19 1345  ceFEPIme (MAXIPIME) 2 g in sodium chloride 0.9 % 100 mL IVPB     2 g 200 mL/hr over 30 Minutes Intravenous  Once 08/26/19 1335 08/26/19 1441       Subjective: Patient seen and evaluated today with no new acute complaints or concerns. No acute concerns or events noted overnight.  He is somewhat sleepy this morning.  Objective: Vitals:   08/29/19 2230 08/30/19 0518 08/30/19 0815 08/30/19 0839  BP: 119/65 121/64 113/63   Pulse: 78 79 72   Resp: 20  16   Temp: 99 F (37.2 C) 99.2 F (37.3 C) 98.1 F (36.7 C)   TempSrc: Oral Oral Oral   SpO2: 100% 98% 97% 98%  Weight:      Height:        Intake/Output Summary (Last 24 hours) at 08/30/2019 1302 Last data filed at 08/30/2019 0600 Gross per 24 hour  Intake 328.76 ml  Output 1700 ml  Net -1371.24 ml   Filed Weights   08/27/19 1027 08/28/19 0500 08/29/19 0500  Weight: 91.6 kg 89.9 kg 90.1 kg    Examination:  General exam: Appears calm and comfortable, somnolent Respiratory system: Clear to auscultation. Respiratory effort normal.  On 2 L nasal cannula oxygen. Cardiovascular system: S1 & S2 heard, RRR. No JVD, murmurs, rubs, gallops or clicks. No pedal  edema. Gastrointestinal system: Abdomen is nondistended, soft and nontender. No organomegaly or masses felt. Normal bowel sounds heard. Central nervous system: Alert and oriented. No focal neurological deficits. Extremities: Symmetric 5 x 5 power. Skin: No rashes, lesions or ulcers Psychiatry: Judgement and insight appear normal. Mood & affect appropriate.     Data Reviewed: I have personally reviewed following labs and imaging studies  CBC: Recent Labs  Lab 08/25/19 0947 08/26/19 1330 08/27/19 0412 08/28/19 0426 08/29/19 0438 08/30/19 0634   WBC 0.8* 0.3* 0.3* 0.4* 1.2* 4.9  NEUTROABS 0.1* 0.0*  --   --   --   --   HGB 8.6* 6.6* 7.7* 7.5* 8.0* 7.7*  HCT 28.0* 21.3* 24.6* 23.4* 25.7* 24.5*  MCV 103.3* 101.9* 99.6 99.2 101.2* 101.7*  PLT 276 143* 135* 115* 142* [id number]   Basic Metabolic Panel: Recent Labs  Lab 08/25/19 0947 08/26/19 1330 08/27/19 0412 08/28/19 0426 08/29/19 0438 08/30/19 0634  NA 137 130* 135 136 136 137  K 4.0 3.5 3.2* 3.2* 3.5 3.6  CL 103 102 105 106 107 106  CO2 24 21* 21* 19* 22 23  GLUCOSE 157* 176* 170* 114* 145* 126*  BUN 39* 31* 26* 19 19 19   CREATININE 1.20 1.19 1.18 1.08 1.14 1.14  CALCIUM 9.6 8.3* 8.3* 8.4* 9.3 9.2  MG 2.1  --   --  1.8 2.0  --   PHOS 3.0  --   --   --   --   --    GFR: Estimated Creatinine Clearance: 59.6 mL/min (by C-G formula based on SCr of 1.14 mg/dL). Liver Function Tests: Recent Labs  Lab 08/25/19 0947 08/26/19 1330  AST 20 23  ALT 18 19  ALKPHOS 63 60  BILITOT 0.5 0.4  PROT 6.6 5.7*  ALBUMIN 2.7* 2.3*   No results for input(s): LIPASE, AMYLASE in the last 168 hours. No results for input(s): AMMONIA in the last 168 hours. Coagulation Profile: Recent Labs  Lab 08/26/19 1330  INR 1.4*   Cardiac Enzymes: No results for input(s): CKTOTAL, CKMB, CKMBINDEX, TROPONINI in the last 168 hours. BNP (last 3 results) No results for input(s): PROBNP in the last 8760 hours. HbA1C: No results for input(s): HGBA1C in the last 72 hours. CBG: Recent Labs  Lab 08/29/19 1137 08/29/19 1647 08/29/19 2123 08/30/19 0730 08/30/19 1110  GLUCAP 130* 157* 103* 124* 106*   Lipid Profile: No results for input(s): CHOL, HDL, LDLCALC, TRIG, CHOLHDL, LDLDIRECT in the last 72 hours. Thyroid Function Tests: No results for input(s): TSH, T4TOTAL, FREET4, T3FREE, THYROIDAB in the last 72 hours. Anemia Panel: No results for input(s): VITAMINB12, FOLATE, FERRITIN, TIBC, IRON, RETICCTPCT in the last 72 hours. Sepsis Labs: Recent Labs  Lab 08/26/19 1330 08/26/19 1453   LATICACIDVEN 1.3 0.9    Recent Results (from the past 240 hour(s))  Urine culture     Status: Abnormal   Collection Time: 08/25/19 10:46 AM   Specimen: Urine, Catheterized  Result Value Ref Range Status   Specimen Description   Final    URINE, CATHETERIZED Performed at [facility], [address], [place], [place] [address]    Special Requests   Final    Immunocompromised Performed at [facility], [address] [place], [place] [address]    Culture >=100,000 COLONIES/mL PROTEUS MIRABILIS (A)  Final   Report Status 08/28/2019 FINAL  Final   Organism ID, Bacteria PROTEUS MIRABILIS (A)  Final      Susceptibility   Proteus mirabilis - MIC*  AMPICILLIN <=2 SENSITIVE Sensitive     CEFAZOLIN <=4 SENSITIVE Sensitive     CEFTRIAXONE <=1 SENSITIVE Sensitive     CIPROFLOXACIN <=0.25 SENSITIVE Sensitive     GENTAMICIN <=1 SENSITIVE Sensitive     IMIPENEM 1 SENSITIVE Sensitive     NITROFURANTOIN 128 RESISTANT Resistant     TRIMETH/SULFA <=20 SENSITIVE Sensitive     AMPICILLIN/SULBACTAM <=2 SENSITIVE Sensitive     PIP/TAZO <=4 SENSITIVE Sensitive     * >=100,000 COLONIES/mL PROTEUS MIRABILIS  Urine culture     Status: Abnormal   Collection Time: 08/26/19  1:30 PM   Specimen: In/Out Cath Urine  Result Value Ref Range Status   Specimen Description   Final    IN/OUT CATH URINE Performed at [facility], [address] [place], [place] [address]    Special Requests   Final    NONE Performed at [facility], [address] [place], [place] [address]    Culture (A)  Final    90,000 COLONIES/mL PROTEUS MIRABILIS 80,000 COLONIES/mL ENTEROCOCCUS FAECALIS    Report Status 08/29/2019 FINAL  Final   Organism ID, Bacteria PROTEUS MIRABILIS (A)  Final   Organism ID, Bacteria ENTEROCOCCUS FAECALIS (A)  Final      Susceptibility   Enterococcus faecalis - MIC*    AMPICILLIN <=2 SENSITIVE Sensitive     LEVOFLOXACIN 1 SENSITIVE Sensitive     NITROFURANTOIN <=16 SENSITIVE Sensitive      VANCOMYCIN 1 SENSITIVE Sensitive     * 80,000 COLONIES/mL ENTEROCOCCUS FAECALIS   Proteus mirabilis - MIC*    AMPICILLIN <=2 SENSITIVE Sensitive     CEFAZOLIN <=4 SENSITIVE Sensitive     CEFTRIAXONE <=1 SENSITIVE Sensitive     CIPROFLOXACIN <=0.25 SENSITIVE Sensitive     GENTAMICIN <=1 SENSITIVE Sensitive     IMIPENEM 1 SENSITIVE Sensitive     NITROFURANTOIN 128 RESISTANT Resistant     TRIMETH/SULFA <=20 SENSITIVE Sensitive     AMPICILLIN/SULBACTAM <=2 SENSITIVE Sensitive     PIP/TAZO <=4 SENSITIVE Sensitive     * 90,000 COLONIES/mL PROTEUS MIRABILIS  Blood Culture (routine x 2)     Status: None (Preliminary result)   Collection Time: 08/26/19  1:36 PM   Specimen: Left Antecubital; Blood  Result Value Ref Range Status   Specimen Description LEFT ANTECUBITAL  Final   Special Requests   Final    BOTTLES DRAWN AEROBIC AND ANAEROBIC Blood Culture adequate volume   Culture   Final    NO GROWTH 4 DAYS Performed at [facility], [address] [place], [place] [address]    Report Status PENDING  Incomplete  SARS CORONAVIRUS 2 (TAT 6-24 HRS) Nasopharyngeal Nasopharyngeal Swab     Status: None   Collection Time: 08/26/19  2:00 PM   Specimen: Nasopharyngeal Swab  Result Value Ref Range Status   SARS Coronavirus 2 NEGATIVE NEGATIVE Final    Comment: (NOTE) SARS-CoV-2 target nucleic acids are NOT DETECTED. The SARS-CoV-2 RNA is generally detectable in upper and lower respiratory specimens during the acute phase of infection. Negative results do not preclude SARS-CoV-2 infection, do not rule out co-infections with other pathogens, and should not be used as the sole basis for treatment or other patient management decisions. Negative results must be combined with clinical observations, patient history, and epidemiological information. The expected result is Negative. Fact Sheet for Patients: SugarRoll.be Fact Sheet for Healthcare Providers:  https://www.woods-mathews.com/ This test is not yet approved or cleared by the [place] and  has been authorized  for detection and/or diagnosis of SARS-CoV-2 by FDA under an Emergency Use Authorization (EUA). This EUA will remain  in effect (meaning this test can be used) for the duration of the COVID-19 declaration under Section 56 4(b)(1) of the Act, 21 U.S.C. section 360bbb-3(b)(1), unless the authorization is terminated or revoked sooner. Performed at [facility], [place] [address] [place], [place] [address]   Blood Culture (routine x 2)     Status: None (Preliminary result)   Collection Time: 08/26/19  2:01 PM   Specimen: BLOOD LEFT HAND  Result Value Ref Range Status   Specimen Description BLOOD LEFT HAND  Final   Special Requests   Final    BOTTLES DRAWN AEROBIC AND ANAEROBIC Blood Culture adequate volume   Culture   Final    NO GROWTH 4 DAYS Performed at [facility], [address] [place], [place] [address]    Report Status PENDING  Incomplete  MRSA PCR Screening     Status: None   Collection Time: 08/26/19  2:10 PM   Specimen: Nasopharyngeal  Result Value Ref Range Status   MRSA by PCR NEGATIVE NEGATIVE Final    Comment:        The GeneXpert MRSA Assay (FDA approved for NASAL specimens only), is one component of a comprehensive MRSA colonization surveillance program. It is not intended to diagnose MRSA infection nor to guide or monitor treatment for MRSA infections. Performed at [facility], [address], [place],  [address]          Radiology Studies: No results found.      Scheduled Meds: . allopurinol  300 mg Oral Daily  . amoxicillin  500 mg Oral TID  . calcitRIOL  0.5 mcg Oral Daily  . Chlorhexidine Gluconate Cloth  6 each Topical Daily  . citalopram  20 mg Oral QHS  . folic acid  1 mg Oral Daily  . gabapentin  600 mg Oral TID  . insulin aspart  0-5 Units Subcutaneous QHS  . insulin aspart  0-9 Units  Subcutaneous TID WC  . mouth rinse  15 mL Mouth Rinse BID  . pantoprazole (PROTONIX) IV  40 mg Intravenous Q24H  . primidone  75 mg Oral Q8H  . propranolol  40 mg Oral BID  . topiramate  200 mg Oral QHS   Continuous Infusions: . argatroban 0.5 mcg/kg/min (08/29/19 1823)     LOS: 4 days    Time spent: 30 minutes    Obi Scrima Darleen Crocker, DO [facility] Pager [phone]  If 7PM-7AM, please contact night-coverage [email or web address] Password [email or web address] 08/30/2019, 1:02 PM

## 2019-08-30 NOTE — Progress Notes (Signed)
ANTICOAGULATION CONSULT NOTE - [place] for Heparin--> argatroban dosing Indication: pulmonary embolus  Allergies  Allergen Reactions  . Vancomycin Hives    Patient received Benadryl to treat the hives    Patient Measurements: Height: 5\' 11"  (180.3 cm) Weight: 198 lb 10.2 oz (90.1 kg) IBW/kg (Calculated) : 75.3 Heparin Dosing Weight: total body weight  Vital Signs: Temp: 98.1 F (36.7 C) (10/24 0815) Temp Source: Oral (10/24 0815) BP: 113/63 (10/24 0815) Pulse Rate: 72 (10/24 0815)  Labs: Recent Labs    08/28/19 0426 08/29/19 0438 08/30/19 0634  HGB 7.5* 8.0* 7.7*  HCT 23.4* 25.7* 24.5*  PLT 115* 142* 182  APTT 76* 67* 53*  CREATININE 1.08 1.14 1.14    Estimated Creatinine Clearance: 59.6 mL/min (by C-G formula based on SCr of 1.14 mg/dL).   Medical History: Past Medical History:  Diagnosis Date  . Anxiety and depression   . Cancer ([place])    lymphoma  . Diabetes mellitus 06/09/2011   pt taking metformin  . DJD of shoulder    right   . Hyperlipidemia   . Hypertension   . Migraines   . Port-A-Cath in place 08/13/2019  . Tremor   . 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 Noted to be hypoxic with elevated d-dimer. CTa shows acute bilateral PE, including small saddle embolus with evidence of right heart strain.    10/23: HIT negative. Patient's platelet count decreased prior to initiation of heparin and likely due to recent chemotherapy . APTT therapeutic at 53 seconds.  Goal of Therapy:   Target aPTT range of 50-90 seconds (1.5-3x control)    Plan:  Continue argatroban infusion at 0.77mcg/kg/min-->2.45mL/hr (ICU patient). Plan to switch to PO anticoagulant on 10/25 so will just continue since argatroban is therapeutic. Check daily CBC and aPTT Assess patient for hepatic function changes and further thromboembolic complications   Margot Ables,  PharmD Clinical Pharmacist 08/30/2019 8:27 AM

## 2019-08-31 LAB — CULTURE, BLOOD (ROUTINE X 2)
Culture: NO GROWTH
Culture: NO GROWTH
Special Requests: ADEQUATE
Special Requests: ADEQUATE

## 2019-08-31 LAB — CBC
HCT: 26.1 % — ABNORMAL LOW (ref 39.0–52.0)
Hemoglobin: 7.9 g/dL — ABNORMAL LOW (ref 13.0–17.0)
MCH: 31.2 pg (ref 26.0–34.0)
MCHC: 30.3 g/dL (ref 30.0–36.0)
MCV: 103.2 fL — ABNORMAL HIGH (ref 80.0–100.0)
Platelets: 275 10*3/uL (ref 150–400)
RBC: 2.53 MIL/uL — ABNORMAL LOW (ref 4.22–5.81)
RDW: 15.4 % (ref 11.5–15.5)
WBC: 9.2 10*3/uL (ref 4.0–10.5)
nRBC: 0.2 % (ref 0.0–0.2)

## 2019-08-31 LAB — GLUCOSE, CAPILLARY
Glucose-Capillary: 127 mg/dL — ABNORMAL HIGH (ref 70–99)
Glucose-Capillary: 134 mg/dL — ABNORMAL HIGH (ref 70–99)

## 2019-08-31 LAB — APTT: aPTT: 37 seconds — ABNORMAL HIGH (ref 24–36)

## 2019-08-31 MED ORDER — APIXABAN 5 MG PO TABS
10.0000 mg | ORAL_TABLET | Freq: Two times a day (BID) | ORAL | Status: DC
  Administered 2019-08-31: 10 mg via ORAL
  Filled 2019-08-31: qty 2

## 2019-08-31 MED ORDER — APIXABAN 5 MG PO TABS: 5.0000 mg | ORAL_TABLET | Freq: Two times a day (BID) | ORAL | Status: DC

## 2019-08-31 MED ORDER — APIXABAN 5 MG PO TABS: 10.0000 mg | ORAL_TABLET | Freq: Two times a day (BID) | ORAL | 0 refills | Status: DC

## 2019-08-31 MED ORDER — AMOXICILLIN 500 MG PO CAPS: 500.0000 mg | ORAL_CAPSULE | Freq: Three times a day (TID) | ORAL | 0 refills | Status: AC

## 2019-08-31 MED ORDER — APIXABAN 5 MG PO TABS: 5.0000 mg | ORAL_TABLET | Freq: Two times a day (BID) | ORAL | 3 refills | Status: DC

## 2019-08-31 NOTE — Discharge Summary (Signed)
Physician Discharge Summary  Daniel Richards [id number] DOB: [birth year] DOA: 08/26/2019  PCP: [name], FNP  Admit date: 08/26/2019  Discharge date: 08/31/2019  Admitted From:Home  Disposition:  Home  Recommendations for Outpatient Follow-up:  1. Follow up with PCP in 1-2 weeks 2. Follow-up with oncology in 2 weeks 3. Recommend outpatient follow-up with urology in 1 to 2 weeks to address Foley catheter 4. Continue on Eliquis as prescribed for anticoagulation 5. Continue on amoxicillin for 2 more days to complete course of treatment for UTI.  Foley catheter recently changed.  Home Health: Yes with PT  Equipment/Devices: None  Discharge Condition: Stable  CODE STATUS: Full  Diet recommendation: Heart Healthy/carb modified  Brief/Interim Summary: Daniel Baldassari Reidis a 73 y.o.malewith medical history significant forB-cell lymphoma on chemotherapy, diabetes mellitus, depression.Patient was brought to the ED via EMS withreports a fever up to 104 recorded by EMS. Daughter reported over the past week patient has been feeling unwell. He has a chronic indwelling Foley catheter. Daughter thinks they might have been blood in patient's urine yesterday,patient confirms this,that this was about 3 days ago, smallquantity and was aone-time episode. He reports pain with Foley catheter,and his pain has improved since his catheter was changed today. Reports he has had chronic indwelling Foley catheter for about 2 months now for inability to void. Patient denies difficulty breathing or cough,no chest pain,vomiting or loose stools no abdominal pain. Patient was seen by his oncologist yesterday, WBC was 0.8, was told to come to the ED if he had fevers. Was prescribed Bactrim which also started today for urinary tract infection. On EMS arrival today patient's O2 sats were 88% on room air,improved on 2 L O2.He denies Covid positive contacts.  Recent hospitalization  10/9-10/20for malignancy related hypercalcemia.  10/21:Patient has been admitted with submassive bilateral PE and has been started on IV heparin and is currently on 2 L nasal cannula oxygen. He is additionally noted to have febrile neutropenia and has been started on IV cefepime, Flagyl and fluconazole. Oncology consultation pending.  10/22:Patient appears to be doing well this morning and is noted to have Proteus in urine culture. Will maintain on cefepime for now and await final culture sensitivities. Discontinue Flagyl and fluconazole. No further fevers noted this a.m. Platelet counts are slightly decreased, but mostly stable. No other acute events noted overnight. Appreciate oncology evaluation.  10/23:Patient noted to have urine cultures with gram-negative rods as well as Enterococcus with susceptibilities pending. He has had Foley catheter change approximately 3 days ago. Blood cultures with no growth in the last 2 days. Hemoglobin and platelet counts remain stable.  10/24: Patient continues on oral amoxicillin today for coverage of Enterococcus and Proteus UTI.  Hemoglobin and platelet counts remain stable on argatroban with plans to convert to oral anticoagulation by a.m. with potential for discharge at that point with home with home health services.  He is somewhat sleepy today.  10/25: Patient is doing well this morning and has been ambulated with physical therapy and is noted to need some home health physical therapy.  He has been weaned off of his oxygen and has remained stable with ambulation in the low 90th percentile.  He has completed 5 days of argatroban and is stable for discharge on Eliquis.  He will need close follow-up with his oncologist Dr. Delton Coombes and will remain on amoxicillin for 2 more days to complete full course of treatment of his Enterococcus and Proteus UTI.  He remains on Foley catheter  which will need reevaluation in the outpatient setting and I have  recommended urology follow-up.  No other acute events noted throughout the course of this admission.  Discharge Diagnoses:  Active Problems:   Febrile neutropenia (HCC)   PE (pulmonary thromboembolism) ([place])   Acute saddle pulmonary embolism with acute cor pulmonale (HCC)   Urinary tract infection associated with indwelling urethral catheter (HCC)  Principal discharge diagnosis: Febrile neutropenia with Proteus and Enterococcus UTI in the setting of Foley catheter use along with submassive bilateral PE with saddle embolus.  Discharge Instructions  Discharge Instructions    Diet - low sodium heart healthy   Complete by: As directed    Increase activity slowly   Complete by: As directed      Allergies as of 08/31/2019      Reactions   Vancomycin Hives   Patient received Benadryl to treat the hives      Medication List    STOP taking these medications   sulfamethoxazole-trimethoprim 800-160 MG tablet Commonly known as: BACTRIM DS     TAKE these medications   acetaminophen 325 MG tablet Commonly known as: TYLENOL Take 2 tablets (650 mg total) by mouth every 6 (six) hours as needed for mild pain, fever or headache.   allopurinol 300 MG tablet Commonly known as: ZYLOPRIM Take 1 tablet (300 mg total) by mouth daily.   amoxicillin 500 MG capsule Commonly known as: AMOXIL Take 1 capsule (500 mg total) by mouth 3 (three) times daily for 2 days.   apixaban 5 MG Tabs tablet Commonly known as: ELIQUIS Take 2 tablets (10 mg total) by mouth 2 (two) times daily for 7 days.   apixaban 5 MG Tabs tablet Commonly known as: ELIQUIS Take 1 tablet (5 mg total) by mouth 2 (two) times daily. Start taking on: September 07, 2019   calcitRIOL 0.5 MCG capsule Commonly known as: ROCALTROL Take 1 capsule (0.5 mcg total) by mouth daily.   citalopram 20 MG tablet Commonly known as: CELEXA Take 1 tablet (20 mg total) by mouth at bedtime.   CYCLOPHOSPHAMIDE IV Inject into the vein every 21  ( twenty-one) days.   DOXORUBICIN HCL IV Inject into the vein every 21 ( twenty-one) days.   folic acid 1 MG tablet Commonly known as: FOLVITE Take 1 tablet (1 mg total) by mouth daily.   gabapentin 300 MG capsule Commonly known as: NEURONTIN Take 2 capsules (600 mg total) by mouth 3 (three) times daily.   lidocaine-prilocaine cream Commonly known as: EMLA Apply a small amount to port a cath site and cover with plastic wrap 1 hour prior to chemotherapy appointments   lisinopril 20 MG tablet Commonly known as: ZESTRIL Take 20 mg by mouth daily.   metFORMIN 500 MG tablet Commonly known as: GLUCOPHAGE Take 1 tablet (500 mg total) by mouth daily.   NON FORMULARY Diet: Regular, NAS, Consistent Carbohydrate   ondansetron 4 MG tablet Commonly known as: ZOFRAN Take 1 tablet (4 mg total) by mouth every 6 (six) hours as needed for nausea.   pantoprazole 40 MG tablet Commonly known as: PROTONIX Take 1 tablet (40 mg total) by mouth 2 (two) times daily before a meal.   polyethylene glycol 17 g packet Commonly known as: MIRALAX / GLYCOLAX Take 17 g by mouth daily as needed for mild constipation.   predniSONE 20 MG tablet Commonly known as: DELTASONE Take 3 tablets (60 mg total) by mouth daily. Take on days 1-5 of chemotherapy. What changed: when to take  this   primidone 50 MG tablet Commonly known as: MYSOLINE Take 1.5 tablets (75 mg total) by mouth every 8 (eight) hours.   prochlorperazine 10 MG tablet Commonly known as: COMPAZINE Take 1 tablet (10 mg total) by mouth every 6 (six) hours as needed (Nausea or vomiting).   propranolol 40 MG tablet Commonly known as: INDERAL Take 1 tablet (40 mg total) by mouth 2 (two) times daily.   riTUXimab in sodium chloride 0.9 % 250 mL Inject into the vein every 21 ( twenty-one) days.   sucralfate 1 GM/10ML suspension Commonly known as: CARAFATE Take 10 mLs (1 g total) by mouth 4 (four) times daily -  with meals and at bedtime.    temazepam 15 MG capsule Commonly known as: RESTORIL Take 1 capsule (15 mg total) by mouth at bedtime as needed for sleep.   topiramate 100 MG tablet Commonly known as: TOPAMAX Take 2 tablets (200 mg total) by mouth at bedtime.   vinCRIStine 2 mg in sodium chloride 0.9 % 50 mL Inject 2 mg into the vein every 21 ( twenty-one) days.      Follow-up Information    Drue Second IV, FNP Follow up in 1 week(s).   Specialty: Family Medicine Contact information: [place] [address] [place] [address] [phone]        Derek Jack, MD Follow up in 2 week(s).   Specialty: Hematology Contact information: [place] [address] [place]. Schedule an appointment as soon as possible for a visit in 1 week(s).   Contact information: [address], [address] [place] [phone]         Allergies  Allergen Reactions  . Vancomycin Hives    Patient received Benadryl to treat the hives    Consultations:  Oncology   Procedures/Studies: Ct Head Wo Contrast  Result Date: 08/15/2019 CLINICAL DATA:  Low grade fever. Body aches, malaise and lethargy for the past day. EXAM: CT HEAD WITHOUT CONTRAST TECHNIQUE: Contiguous axial images were obtained from the base of the skull through the vertex without intravenous contrast. COMPARISON:  Head CT scan 07/24/2019. FINDINGS: Brain: No evidence of acute infarction, hemorrhage, hydrocephalus, extra-axial collection or mass lesion/mass effect. Mild atrophy and chronic microvascular ischemic change noted. Vascular: No hyperdense vessel or unexpected calcification. Skull: Intact.  No focal lesion. Sinuses/Orbits: Negative. Other: None. IMPRESSION: No acute abnormality. Mild atrophy and chronic microvascular ischemic change. Electronically Signed   By: Inge Rise M.D.   On: 08/15/2019 16:07   Ct Angio  Chest Pe W And/or Wo Contrast  Result Date: 08/26/2019 CLINICAL DATA:  Fever hypoxia not feeling well EXAM: CT ANGIOGRAPHY CHEST WITH CONTRAST TECHNIQUE: Multidetector CT imaging of the chest was performed using the standard protocol during bolus administration of intravenous contrast. Multiplanar CT image reconstructions and MIPs were obtained to evaluate the vascular anatomy. CONTRAST:  139mL OMNIPAQUE IOHEXOL 350 MG/ML SOLN COMPARISON:  Chest x-ray 08/26/2019, PET CT 08/07/2019, CT chest 07/25/2019 FINDINGS: Cardiovascular: Satisfactory opacification of the pulmonary arteries to the segmental level. Small saddle embolus within the distal left pulmonary artery, with extension of thrombus into left upper and lower lobe segmental and subsegmental vessels. Small nonocclusive thrombi within right upper, right middle, and right lower lobe segmental and subsegmental vessels. RV LV ratio is 1.0. Nonaneurysmal aorta with mild aortic atherosclerosis. No dissection. Normal heart size. Coronary vascular calcifications.  No large pericardial effusion Mediastinum/Nodes: Midline trachea. No thyroid mass. Scattered calcifications in the right lobe. Esophagus within normal limits. Similar to decreased size of mediastinal lymph nodes. Periesophageal lymph node measures 1 cm compared with 1.1 cm previously. Lungs/Pleura: Emphysema. No pleural effusion. Small foci of sub pleural density, likely reflecting atelectasis. Upper Abdomen: Incompletely visualized retroperitoneal adenopathy. Musculoskeletal: No acute or suspicious osseous abnormality. Review of the MIP images confirms the above findings. IMPRESSION: 1. Positive for acute bilateral pulmonary emboli, including small saddle embolus in the distal left pulmonary artery. Positive for acute PE with CT evidence of right heart strain (RV/LV Ratio = 1.0) consistent with at least submassive (intermediate risk) PE. The presence of right heart strain has been associated with an  increased risk of morbidity and mortality. Please activate Code PE by paging [phone]. 2. Emphysema 3. Similar appearance of small mediastinal nodes. Incompletely visualized retroperitoneal adenopathy in the abdomen Critical Value/emergent results were called by telephone at the time of interpretation on 08/26/2019 at 6:20 pm to provider Dr. Bethena Roys, who verbally acknowledged these results. Aortic Atherosclerosis (ICD10-I70.0) and Emphysema (ICD10-J43.9). Electronically Signed   By: Donavan Foil M.D.   On: 08/26/2019 18:21   Nm Pet Image Initial (pi) Skull Base To Thigh  Result Date: 08/07/2019 CLINICAL DATA:  Initial treatment strategy for staging of lymphoma. EXAM: NUCLEAR MEDICINE PET SKULL BASE TO THIGH TECHNIQUE: 9.4 mCi F-18 FDG was injected intravenously. Full-ring PET imaging was performed from the skull base to thigh after the radiotracer. CT data was obtained and used for attenuation correction and anatomic localization. Fasting blood glucose: 143 mg/dl COMPARISON:  Chest CT 07/25/2019.  Abdominopelvic CT 07/24/2019. FINDINGS: Mediastinal blood pool activity: SUV max 2.3 Liver activity: SUV max 4.0 NECK: Right ethmoid air cell likely expansile soft tissue density corresponding to hypermetabolism. Example 2.3 x 1.5 cm and a S.U.V. max of 9.3 on 08/04. Low left jugular/supraclavicular nodes at up to 7 mm and a S.U.V. max of 5.9 on 41/4. Incidental CT findings: Bilateral carotid atherosclerosis. CHEST: Thoracic nodal hypermetabolism. Example low subcarinal/periesophageal node of 1.1 cm and a S.U.V. max of 11.6 on 77/4. Retrocrural node measures 8 mm and a S.U.V. max of 8.1 on image 107/4. Incidental CT findings: Mild cardiomegaly. Multivessel coronary artery atherosclerosis. Aortic atherosclerosis. Emphysema. ABDOMEN/PELVIS: Bulky abdominal hypermetabolic adenopathy. An index nodal mass in the low left periaortic station measures 4.1 x 4.4 cm and a S.U.V. max of 16.1 on image 146/4.  Massive left pelvic nodal conglomerate measures on the order of 13.9 x 5.7 cm and a S.U.V. max of 14.7 on 180/4. Incidental CT findings: Abdominal aortic atherosclerosis. Punctate bilateral renal collecting system calculi. Colonic stool burden suggests constipation. Foley catheter within the urinary bladder. The bladder is decompressed, but appears thick walled. Bladder stones and surrounding pericystic edema. Mild prostatomegaly. SKELETON: Heterogeneous foci of marrow hypermetabolism. Example within the sternum at a S.U.V. max of 9.3. Incidental CT findings: Cervical spine fixation. IMPRESSION: 1. 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 Heterogeneous foci of marrow hypermetabolism, consistent with lymphomatous involvement. 2. Right sphenoid sinus soft tissue density and hypermetabolism. Especially given suggestion of expansion in this area, favored to represent lymphomatous involvement. Sinusitis could look similar. 3. Aortic atherosclerosis (ICD10-I70.0), coronary artery atherosclerosis and emphysema (ICD10-J43.9). 4. Prostatomegaly. Findings of bladder outlet obstruction, including pericystic edema, wall thickening, and bladder stones. Cystitis cannot be excluded. 5. Bilateral nephrolithiasis. Electronically Signed   By: Abigail Miyamoto M.D.   On:  08/07/2019 16:30   Dg Chest Port 1 View  Result Date: 08/26/2019 CLINICAL DATA:  Hypoxia EXAM: PORTABLE CHEST 1 VIEW COMPARISON:  08/15/2019 FINDINGS: Left chest wall port is again noted. Cardiac shadow is stable. The lungs are well aerated bilaterally. No bony abnormality is noted. IMPRESSION: No active disease. Electronically Signed   By: Inez Catalina M.D.   On: 08/26/2019 13:59   Dg Chest Portable 1 View  Result Date: 08/15/2019 CLINICAL DATA:  Weakness, cough, malaise and fever for 1-2 days. EXAM: PORTABLE CHEST 1 VIEW COMPARISON:  Single-view of the chest 08/08/2019 and 11/16/2016. CT chest 07/25/2019. FINDINGS:  Port-A-Cath is in place, unchanged. Lungs are clear. No pneumothorax or pleural effusion. Heart size is normal. No acute or focal bony abnormality. IMPRESSION: No acute disease. Electronically Signed   By: Inge Rise M.D.   On: 08/15/2019 13:42   Dg Chest Port 1 View  Result Date: 08/08/2019 CLINICAL DATA:  S/p port a cath  placement EXAM: PORTABLE CHEST 1 VIEW COMPARISON:  Chest radiographs 11/16/2016, 06/09/2011 FINDINGS: Status post placement of a left chest Port-A-Cath with central venous catheter tip projecting over the SVC. Stable cardiomediastinal contours with normal heart size. The lungs are clear. No pneumothorax or large pleural effusion. No acute finding in the visualized skeleton. IMPRESSION: Status post left chest Port-A-Cath placement with central venous catheter tip projecting over the SVC. No pneumothorax. Electronically Signed   By: Audie Pinto M.D.   On: 08/08/2019 10:51   Dg Fluoro Guided Needle Plc Aspiration/injection Loc  Result Date: 08/14/2019 CLINICAL DATA:  High-grade B-cell lymphoma EXAM: DIAGNOSTIC LUMBAR PUNCTURE UNDER FLUOROSCOPIC GUIDANCE FLUOROSCOPY TIME:  Fluoroscopy Time:  0 minutes 12 seconds Radiation Exposure Index (if provided by the fluoroscopic device): 3.1 mGy Number of Acquired Spot Images: 1 digital fluoroscopic image capture PROCEDURE: Procedure, benefits, and risks were discussed with the patient, including alternatives. Patient's questions were answered. Written informed consent was obtained. Timeout protocol followed. Patient placed prone. L4-L5 disc space was localized under fluoroscopy. Skin prepped and draped in usual sterile fashion. Skin and soft tissues anesthetized with 2 mL of 1% lidocaine. 22 gauge needle was advanced into the spinal canal where clear colorless CSF was encountered with an opening pressure of 3 cm H2O (measured with patient prone). 11 mL of CSF was obtained in 4 tubes for requested analysis. Procedure tolerated very well by  patient without immediate complication. IMPRESSION: Fluoroscopic guided lumbar puncture as above. Electronically Signed   By: Lavonia Dana M.D.   On: 08/14/2019 12:07   Dg C-arm 1-60 Min-no Report  Result Date: 08/08/2019 Fluoroscopy was utilized by the requesting physician.  No radiographic interpretation.     Discharge Exam: Vitals:   08/31/19 0812 08/31/19 1030  BP: (!) 87/52 136/69  Pulse: 76 74  Resp:    Temp:    SpO2:     Vitals:   08/30/19 2256 08/31/19 0500 08/31/19 0812 08/31/19 1030  BP:  111/60 (!) 87/52 136/69  Pulse:  74 76 74  Resp:  17    Temp:  98.4 F (36.9 C)    TempSrc:  Oral    SpO2: 92% 99%    Weight:      Height:        General: Pt is alert, awake, not in acute distress Cardiovascular: RRR, S1/S2 +, no rubs, no gallops Respiratory: CTA bilaterally, no wheezing, no rhonchi Abdominal: Soft, NT, ND, bowel sounds + Extremities: no edema, no cyanosis    The results of significant  diagnostics from this hospitalization (including imaging, microbiology, ancillary and laboratory) are listed below for reference.     Microbiology: Recent Results (from the past 240 hour(s))  Urine culture     Status: Abnormal   Collection Time: 08/25/19 10:46 AM   Specimen: Urine, Catheterized  Result Value Ref Range Status   Specimen Description   Final    URINE, CATHETERIZED Performed at [facility], [address] [place], [place] [address]    Special Requests   Final    Immunocompromised Performed at [facility], [address], [place], [place] [address]    Culture >=100,000 COLONIES/mL PROTEUS MIRABILIS (A)  Final   Report Status 08/28/2019 FINAL  Final   Organism ID, Bacteria PROTEUS MIRABILIS (A)  Final      Susceptibility   Proteus mirabilis - MIC*    AMPICILLIN <=2 SENSITIVE Sensitive     CEFAZOLIN <=4 SENSITIVE Sensitive     CEFTRIAXONE <=1 SENSITIVE Sensitive     CIPROFLOXACIN <=0.25 SENSITIVE Sensitive     GENTAMICIN <=1 SENSITIVE Sensitive      IMIPENEM 1 SENSITIVE Sensitive     NITROFURANTOIN 128 RESISTANT Resistant     TRIMETH/SULFA <=20 SENSITIVE Sensitive     AMPICILLIN/SULBACTAM <=2 SENSITIVE Sensitive     PIP/TAZO <=4 SENSITIVE Sensitive     * >=100,000 COLONIES/mL PROTEUS MIRABILIS  Urine culture     Status: Abnormal   Collection Time: 08/26/19  1:30 PM   Specimen: In/Out Cath Urine  Result Value Ref Range Status   Specimen Description   Final    IN/OUT CATH URINE Performed at [facility], [address] [place], [place] [address]    Special Requests   Final    NONE Performed at [facility], [address] [place], [place] [address]    Culture (A)  Final    90,000 COLONIES/mL PROTEUS MIRABILIS 80,000 COLONIES/mL ENTEROCOCCUS FAECALIS    Report Status 08/29/2019 FINAL  Final   Organism ID, Bacteria PROTEUS MIRABILIS (A)  Final   Organism ID, Bacteria ENTEROCOCCUS FAECALIS (A)  Final      Susceptibility   Enterococcus faecalis - MIC*    AMPICILLIN <=2 SENSITIVE Sensitive     LEVOFLOXACIN 1 SENSITIVE Sensitive     NITROFURANTOIN <=16 SENSITIVE Sensitive     VANCOMYCIN 1 SENSITIVE Sensitive     * 80,000 COLONIES/mL ENTEROCOCCUS FAECALIS   Proteus mirabilis - MIC*    AMPICILLIN <=2 SENSITIVE Sensitive     CEFAZOLIN <=4 SENSITIVE Sensitive     CEFTRIAXONE <=1 SENSITIVE Sensitive     CIPROFLOXACIN <=0.25 SENSITIVE Sensitive     GENTAMICIN <=1 SENSITIVE Sensitive     IMIPENEM 1 SENSITIVE Sensitive     NITROFURANTOIN 128 RESISTANT Resistant     TRIMETH/SULFA <=20 SENSITIVE Sensitive     AMPICILLIN/SULBACTAM <=2 SENSITIVE Sensitive     PIP/TAZO <=4 SENSITIVE Sensitive     * 90,000 COLONIES/mL PROTEUS MIRABILIS  Blood Culture (routine x 2)     Status: None   Collection Time: 08/26/19  1:36 PM   Specimen: Left Antecubital; Blood  Result Value Ref Range Status   Specimen Description LEFT ANTECUBITAL  Final   Special Requests   Final    BOTTLES DRAWN AEROBIC AND ANAEROBIC Blood Culture adequate volume   Culture    Final    NO GROWTH 5 DAYS Performed at [facility], [address] [place], [place] [address]    Report Status 08/31/2019 FINAL  Final  SARS CORONAVIRUS 2 (TAT 6-24 HRS) Nasopharyngeal  Nasopharyngeal Swab     Status: None   Collection Time: 08/26/19  2:00 PM   Specimen: Nasopharyngeal Swab  Result Value Ref Range Status   SARS Coronavirus 2 NEGATIVE NEGATIVE Final    Comment: (NOTE) SARS-CoV-2 target nucleic acids are NOT DETECTED. The SARS-CoV-2 RNA is generally detectable in upper and lower respiratory specimens during the acute phase of infection. Negative results do not preclude SARS-CoV-2 infection, do not rule out co-infections with other pathogens, and should not be used as the sole basis for treatment or other patient management decisions. Negative results must be combined with clinical observations, patient history, and epidemiological information. The expected result is Negative. Fact Sheet for Patients: SugarRoll.be Fact Sheet for Healthcare Providers: https://www.woods-mathews.com/ This test is not yet approved or cleared by the [place] FDA and  has been authorized for detection and/or diagnosis of SARS-CoV-2 by FDA under an Emergency Use Authorization (EUA). This EUA will remain  in effect (meaning this test can be used) for the duration of the COVID-19 declaration under Section 56 4(b)(1) of the Act, 21 U.S.C. section 360bbb-3(b)(1), unless the authorization is terminated or revoked sooner. Performed at [facility], [place] [address], [place], [place] [address]   Blood Culture (routine x 2)     Status: None   Collection Time: 08/26/19  2:01 PM   Specimen: BLOOD LEFT HAND  Result Value Ref Range Status   Specimen Description BLOOD LEFT HAND  Final   Special Requests   Final    BOTTLES DRAWN AEROBIC AND ANAEROBIC Blood Culture adequate volume   Culture   Final    NO GROWTH 5 DAYS Performed at [facility], [address], [place], [place] [address]    Report Status 08/31/2019 FINAL  Final  MRSA PCR Screening     Status: None   Collection Time: 08/26/19  2:10 PM   Specimen: Nasopharyngeal  Result Value Ref Range Status   MRSA by PCR NEGATIVE NEGATIVE Final    Comment:        The GeneXpert MRSA Assay (FDA approved for NASAL specimens only), is one component of a comprehensive MRSA colonization surveillance program. It is not intended to diagnose MRSA infection nor to guide or monitor treatment for MRSA infections. Performed at [facility], [address] [place], [place] [address]      Labs: BNP (last 3 results) No results for input(s): BNP in the last 8760 hours. Basic Metabolic Panel: Recent Labs  Lab 08/25/19 0947 08/26/19 1330 08/27/19 0412 08/28/19 0426 08/29/19 0438 08/30/19 0634  NA 137 130* 135 136 136 137  K 4.0 3.5 3.2* 3.2* 3.5 3.6  CL 103 102 105 106 107 106  CO2 24 21* 21* 19* 22 23  GLUCOSE 157* 176* 170* 114* 145* 126*  BUN 39* 31* 26* 19 19 19   CREATININE 1.20 1.19 1.18 1.08 1.14 1.14  CALCIUM 9.6 8.3* 8.3* 8.4* 9.3 9.2  MG 2.1  --   --  1.8 2.0  --   PHOS 3.0  --   --   --   --   --    Liver Function Tests: Recent Labs  Lab 08/25/19 0947 08/26/19 1330  AST 20 23  ALT 18 19  ALKPHOS 63 60  BILITOT 0.5 0.4  PROT 6.6 5.7*  ALBUMIN 2.7* 2.3*   No results for input(s): LIPASE, AMYLASE in the last 168 hours. No results for input(s): AMMONIA in the last 168 hours. CBC: Recent Labs  Lab 08/25/19  [id number] 08/26/19 1330 08/27/19 0412 08/28/19 0426 08/29/19 0438 08/30/19 0634 08/31/19 0628  WBC 0.8* 0.3* 0.3* 0.4* 1.2* 4.9 9.2  NEUTROABS 0.1* 0.0*  --   --   --   --   --   HGB 8.6* 6.6* 7.7* 7.5* 8.0* 7.7* 7.9*  HCT 28.0* 21.3* 24.6* 23.4* 25.7* 24.5* 26.1*  MCV 103.3* 101.9* 99.6 99.2 101.2* 101.7* 103.2*  PLT 276 143* 135* 115* 142* 182 275   Cardiac Enzymes: No results for input(s): CKTOTAL, CKMB, CKMBINDEX, TROPONINI in the last 168  hours. BNP: Invalid input(s): POCBNP CBG: Recent Labs  Lab 08/30/19 1110 08/30/19 1605 08/30/19 2125 08/31/19 0756 08/31/19 1117  GLUCAP 106* 131* 126* 127* 134*   D-Dimer No results for input(s): DDIMER in the last 72 hours. Hgb A1c No results for input(s): HGBA1C in the last 72 hours. Lipid Profile No results for input(s): CHOL, HDL, LDLCALC, TRIG, CHOLHDL, LDLDIRECT in the last 72 hours. Thyroid function studies No results for input(s): TSH, T4TOTAL, T3FREE, THYROIDAB in the last 72 hours.  Invalid input(s): FREET3 Anemia work up No results for input(s): VITAMINB12, FOLATE, FERRITIN, TIBC, IRON, RETICCTPCT in the last 72 hours. Urinalysis    Component Value Date/Time   COLORURINE YELLOW 08/26/2019 1330   APPEARANCEUR CLOUDY (A) 08/26/2019 1330   LABSPEC 1.006 08/26/2019 1330   PHURINE 7.0 08/26/2019 1330   GLUCOSEU >=500 (A) 08/26/2019 1330   HGBUR LARGE (A) 08/26/2019 1330   BILIRUBINUR NEGATIVE 08/26/2019 1330   KETONESUR NEGATIVE 08/26/2019 1330   PROTEINUR 100 (A) 08/26/2019 1330   UROBILINOGEN 1.0 11/18/2014 1238   NITRITE NEGATIVE 08/26/2019 1330   LEUKOCYTESUR MODERATE (A) 08/26/2019 1330   Sepsis Labs Invalid input(s): PROCALCITONIN,  WBC,  LACTICIDVEN Microbiology Recent Results (from the past 240 hour(s))  Urine culture     Status: Abnormal   Collection Time: 08/25/19 10:46 AM   Specimen: Urine, Catheterized  Result Value Ref Range Status   Specimen Description   Final    URINE, CATHETERIZED Performed at [facility], [address] [place], [place] [address]    Special Requests   Final    Immunocompromised Performed at [facility], [address] [place], [place] [address]    Culture >=100,000 COLONIES/mL PROTEUS MIRABILIS (A)  Final   Report Status 08/28/2019 FINAL  Final   Organism ID, Bacteria PROTEUS MIRABILIS (A)  Final      Susceptibility   Proteus mirabilis - MIC*    AMPICILLIN <=2 SENSITIVE Sensitive     CEFAZOLIN <=4 SENSITIVE  Sensitive     CEFTRIAXONE <=1 SENSITIVE Sensitive     CIPROFLOXACIN <=0.25 SENSITIVE Sensitive     GENTAMICIN <=1 SENSITIVE Sensitive     IMIPENEM 1 SENSITIVE Sensitive     NITROFURANTOIN 128 RESISTANT Resistant     TRIMETH/SULFA <=20 SENSITIVE Sensitive     AMPICILLIN/SULBACTAM <=2 SENSITIVE Sensitive     PIP/TAZO <=4 SENSITIVE Sensitive     * >=100,000 COLONIES/mL PROTEUS MIRABILIS  Urine culture     Status: Abnormal   Collection Time: 08/26/19  1:30 PM   Specimen: In/Out Cath Urine  Result Value Ref Range Status   Specimen Description   Final    IN/OUT CATH URINE Performed at [facility], [address] [place], [place] [address]    Special Requests   Final    NONE Performed at [facility], [address] [place], [place] [address]    Culture (A)  Final    90,000 COLONIES/mL PROTEUS MIRABILIS 80,000 COLONIES/mL ENTEROCOCCUS  FAECALIS    Report Status 08/29/2019 FINAL  Final   Organism ID, Bacteria PROTEUS MIRABILIS (A)  Final   Organism ID, Bacteria ENTEROCOCCUS FAECALIS (A)  Final      Susceptibility   Enterococcus faecalis - MIC*    AMPICILLIN <=2 SENSITIVE Sensitive     LEVOFLOXACIN 1 SENSITIVE Sensitive     NITROFURANTOIN <=16 SENSITIVE Sensitive     VANCOMYCIN 1 SENSITIVE Sensitive     * 80,000 COLONIES/mL ENTEROCOCCUS FAECALIS   Proteus mirabilis - MIC*    AMPICILLIN <=2 SENSITIVE Sensitive     CEFAZOLIN <=4 SENSITIVE Sensitive     CEFTRIAXONE <=1 SENSITIVE Sensitive     CIPROFLOXACIN <=0.25 SENSITIVE Sensitive     GENTAMICIN <=1 SENSITIVE Sensitive     IMIPENEM 1 SENSITIVE Sensitive     NITROFURANTOIN 128 RESISTANT Resistant     TRIMETH/SULFA <=20 SENSITIVE Sensitive     AMPICILLIN/SULBACTAM <=2 SENSITIVE Sensitive     PIP/TAZO <=4 SENSITIVE Sensitive     * 90,000 COLONIES/mL PROTEUS MIRABILIS  Blood Culture (routine x 2)     Status: None   Collection Time: 08/26/19  1:36 PM   Specimen: Left Antecubital; Blood  Result Value Ref Range Status   Specimen  Description LEFT ANTECUBITAL  Final   Special Requests   Final    BOTTLES DRAWN AEROBIC AND ANAEROBIC Blood Culture adequate volume   Culture   Final    NO GROWTH 5 DAYS Performed at [facility], [address] [place], [place] [address]    Report Status 08/31/2019 FINAL  Final  SARS CORONAVIRUS 2 (TAT 6-24 HRS) Nasopharyngeal Nasopharyngeal Swab     Status: None   Collection Time: 08/26/19  2:00 PM   Specimen: Nasopharyngeal Swab  Result Value Ref Range Status   SARS Coronavirus 2 NEGATIVE NEGATIVE Final    Comment: (NOTE) SARS-CoV-2 target nucleic acids are NOT DETECTED. The SARS-CoV-2 RNA is generally detectable in upper and lower respiratory specimens during the acute phase of infection. Negative results do not preclude SARS-CoV-2 infection, do not rule out co-infections with other pathogens, and should not be used as the sole basis for treatment or other patient management decisions. Negative results must be combined with clinical observations, patient history, and epidemiological information. The expected result is Negative. Fact Sheet for Patients: SugarRoll.be Fact Sheet for Healthcare Providers: https://www.woods-mathews.com/ This test is not yet approved or cleared by the [place] FDA and  has been authorized for detection and/or diagnosis of SARS-CoV-2 by FDA under an Emergency Use Authorization (EUA). This EUA will remain  in effect (meaning this test can be used) for the duration of the COVID-19 declaration under Section 56 4(b)(1) of the Act, 21 U.S.C. section 360bbb-3(b)(1), unless the authorization is terminated or revoked sooner. Performed at [facility], [place] [address], [place], [place] [address]   Blood Culture (routine x 2)     Status: None   Collection Time: 08/26/19  2:01 PM   Specimen: BLOOD LEFT HAND  Result Value Ref Range Status   Specimen Description BLOOD LEFT HAND  Final   Special Requests    Final    BOTTLES DRAWN AEROBIC AND ANAEROBIC Blood Culture adequate volume   Culture   Final    NO GROWTH 5 DAYS Performed at [facility], [address] [place], [place] [address]    Report Status 08/31/2019 FINAL  Final  MRSA PCR Screening     Status: None   Collection Time: 08/26/19  2:10 PM  Specimen: Nasopharyngeal  Result Value Ref Range Status   MRSA by PCR NEGATIVE NEGATIVE Final    Comment:        The GeneXpert MRSA Assay (FDA approved for NASAL specimens only), is one component of a comprehensive MRSA colonization surveillance program. It is not intended to diagnose MRSA infection nor to guide or monitor treatment for MRSA infections. Performed at [facility], [address], [place], [place] [address]      Time coordinating discharge: 35 minutes  SIGNED:   Rodena Goldmann, DO [facility] 08/31/2019, 11:55 AM  If 7PM-7AM, please contact night-coverage [email or web address]

## 2019-08-31 NOTE — Progress Notes (Signed)
Discharge packet and instructions given to patients family Daniel Richards. All question answered pertaining to discharge instructions. Family and patient deny any further needs. [name]  08/31/2019 [id number]

## 2019-08-31 NOTE — Plan of Care (Signed)
  Problem: Acute Rehab PT Goals(only PT should resolve) Goal: Pt Will Go Supine/Side To Sit Outcome: Progressing Flowsheets (Taken 08/31/2019 1134) Pt will go Supine/Side to Sit:  with minimal assist  with moderate assist Goal: Patient Will Transfer Sit To/From Stand Outcome: Progressing Flowsheets (Taken 08/31/2019 1134) Patient will transfer sit to/from stand:  with minimal assist  with moderate assist Goal: Pt Will Transfer Bed To Chair/Chair To Bed Outcome: Progressing Flowsheets (Taken 08/31/2019 1134) Pt will Transfer Bed to Chair/Chair to Bed:  with min assist  with mod assist Goal: Pt Will Ambulate Outcome: Progressing Flowsheets (Taken 08/31/2019 1134) Pt will Ambulate:  15 feet  with moderate assist  with rolling walker   11:34 AM, 08/31/19 Lonell Grandchild, MPT Physical Therapist with [facility] [phone] office [phone] mobile phone

## 2019-08-31 NOTE — Progress Notes (Signed)
ANTICOAGULATION CONSULT NOTE - [place] for apixaban Indication: pulmonary embolus  Allergies  Allergen Reactions  . Vancomycin Hives    Patient received Benadryl to treat the hives    Patient Measurements: Height: 5\' 11"  (180.3 cm) Weight: 198 lb 10.2 oz (90.1 kg) IBW/kg (Calculated) : 75.3   Vital Signs: Temp: 98.4 F (36.9 C) (10/25 0500) Temp Source: Oral (10/25 0500) BP: 87/52 (10/25 0812) Pulse Rate: 76 (10/25 0812)  Labs: Recent Labs    08/29/19 0438 08/30/19 0634 08/31/19 0628  HGB 8.0* 7.7* 7.9*  HCT 25.7* 24.5* 26.1*  PLT 142* 182 275  APTT 67* 53* 37*  CREATININE 1.14 1.14  --     Estimated Creatinine Clearance: 59.6 mL/min (by C-G formula based on SCr of 1.14 mg/dL).   Medical History: Past Medical History:  Diagnosis Date  . Anxiety and depression   . Cancer ([place])    lymphoma  . Diabetes mellitus 06/09/2011   pt taking metformin  . DJD of shoulder    right   . Hyperlipidemia   . Hypertension   . Migraines   . Port-A-Cath in place 08/13/2019  . Tremor   . 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 Noted to be hypoxic with elevated d-dimer. CTa shows acute bilateral PE, including small saddle embolus with evidence of right heart strain.    10/23: HIT negative. Patient's platelet count decreased prior to initiation of heparin and likely due to recent chemotherapy   10/25: Transition to apixaban   Goal of Therapy:  Monitor platelets per protocol    Plan:  Apixaban 10 mg BID for 7 days followed by Apixaban 5 mg BID. Monitor labs and s/s of bleeding.   Margot Ables, PharmD Clinical Pharmacist 08/31/2019 8:32 AM

## 2019-08-31 NOTE — Discharge Instructions (Signed)
Information on my medicine - ELIQUIS (apixaban)  This medication education was reviewed with me or my healthcare representative as part of my discharge preparation.   Why was Eliquis prescribed for you? Eliquis was prescribed to treat blood clots that may have been found in the veins of your legs (deep vein thrombosis) or in your lungs (pulmonary embolism) and to reduce the risk of them occurring again.  What do You need to know about Eliquis ? The starting dose is 10 mg (two 5 mg tablets) taken TWICE daily for the FIRST SEVEN (7) DAYS, then on (enter date)  09/07/2019  the dose is reduced to ONE 5 mg tablet taken TWICE daily.  Eliquis may be taken with or without food.   Try to take the dose about the same time in the morning and in the evening. If you have difficulty swallowing the tablet whole please discuss with your pharmacist how to take the medication safely.  Take Eliquis exactly as prescribed and DO NOT stop taking Eliquis without talking to the doctor who prescribed the medication.  Stopping may increase your risk of developing a new blood clot.  Refill your prescription before you run out.  After discharge, you should have regular check-up appointments with your healthcare provider that is prescribing your Eliquis.    What do you do if you miss a dose? If a dose of ELIQUIS is not taken at the scheduled time, take it as soon as possible on the same day and twice-daily administration should be resumed. The dose should not be doubled to make up for a missed dose.  Important Safety Information A possible side effect of Eliquis is bleeding. You should call your healthcare provider right away if you experience any of the following: ? Bleeding from an injury or your nose that does not stop. ? Unusual colored urine (red or dark brown) or unusual colored stools (red or black). ? Unusual bruising for unknown reasons. ? A serious fall or if you hit your head (even if there is no  bleeding).  Some medicines may interact with Eliquis and might increase your risk of bleeding or clotting while on Eliquis. To help avoid this, consult your healthcare provider or pharmacist prior to using any new prescription or non-prescription medications, including herbals, vitamins, non-steroidal anti-inflammatory drugs (NSAIDs) and supplements.  This website has more information on Eliquis (apixaban): http://[email or web address].com/eliquis/home

## 2019-08-31 NOTE — Evaluation (Signed)
Physical Therapy Evaluation Patient Details Name: Daniel Richards MRN: [id number] DOB: July 23, 1946 Today's Date: 08/31/2019   History of Present Illness  Daniel Richards is a 73 y.o. 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  Patient was brought to the ED via EMS with reports a fever up to 104 recorded by EMS.  Daughter reported over the past week patient has been feeling unwell.  He has a chronic indwelling Foley catheter.  Daughter thinks they might have been blood in patient's urine yesterday, patient confirms this, that this was about 3 days ago, small quantity and was a one-time episode.  He reports pain with Foley catheter, and his pain has improved since his catheter was changed today.  Reports he has had chronic indwelling Foley catheter for about 2 months now for inability to void.Patient denies difficulty breathing or cough, no chest pain, vomiting or loose stools no abdominal pain.    Clinical Impression  Patient functioning near/at baseline for functional mobility and gait, at baseline patient states he does not walk, only transfers with family assisting, able to take a few side steps using RW to transfer to chair and tolerated sitting up after therapy - RN aware.  Patient advised to have family members use RW for his transfers when he return home.  Patient will benefit from continued physical therapy in hospital and recommended venue below to increase strength, balance, endurance for safe ADLs and gait.     Follow Up Recommendations Home health PT;Supervision for mobility/OOB;Supervision/Assistance - 24 hour    Equipment Recommendations  None recommended by PT    Recommendations for Other Services       Precautions / Restrictions Precautions Precautions: Fall Restrictions Weight Bearing Restrictions: No      Mobility  Bed Mobility Overal bed mobility: Needs Assistance Bed Mobility: Supine to Sit       Sit to  supine: Min assist;Mod assist   General bed mobility comments: increased time, labored movement, frequent leaning backwards once seated  Transfers Overall transfer level: Needs assistance Equipment used: Rolling walker (2 wheeled) Transfers: Sit to/from [facility] Sit to Stand: Mod assist Stand pivot transfers: Mod assist       General transfer comment: slow labored movement, required repeated atttempts before able to stand and take a few steps to transfer to chair  Ambulation/Gait Ambulation/Gait assistance: Mod assist;Max assist Gait Distance (Feet): 4 Feet Assistive device: Rolling walker (2 wheeled) Gait Pattern/deviations: Decreased step length - right;Decreased step length - left;Decreased stride length Gait velocity: decreased   General Gait Details: limited to 4-5 slow unsteady labored side steps due to BLE weakness, poor standing balance  Stairs            Wheelchair Mobility    Modified Rankin (Stroke Patients Only)       Balance Overall balance assessment: Needs assistance Sitting-balance support: Feet supported;Bilateral upper extremity supported Sitting balance-Leahy Scale: Fair Sitting balance - Comments: seated at EOB Postural control: Posterior lean Standing balance support: Bilateral upper extremity supported;During functional activity Standing balance-Leahy Scale: Fair Standing balance comment: fair/poor using RW                             Pertinent Vitals/Pain Pain Score: 2  Pain Location: low back Pain Descriptors / Indicators: Sore Pain Intervention(s): Limited activity within patient's tolerance;Monitored during session    Home Living Family/patient expects to be discharged to:: Private residence  Living Arrangements: Children Available Help at Discharge: Family;Available 24 hours/day Type of Home: Mobile home Home Access: Ramped entrance     Home Layout: One level Home Equipment: [place] - single  point;Walker - 2 wheels;Wheelchair - manual;Hospital bed      Prior Function Level of [place]: Needs assistance   Gait / Transfers Assistance Needed: non-ambulatory, bed bound most of time, occasionally assisted transfers to chair with family assisting, uses depends or cleaned up in bed "per patient"  ADL's / Homemaking Assistance Needed: assisted by family        Hand Dominance   Dominant Hand: Right    Extremity/Trunk Assessment   Upper Extremity Assessment Upper Extremity Assessment: Generalized weakness    Lower Extremity Assessment Lower Extremity Assessment: Generalized weakness    Cervical / Trunk Assessment Cervical / Trunk Assessment: Kyphotic  Communication   Communication: No difficulties  Cognition Arousal/Alertness: Awake/alert Behavior During Therapy: WFL for tasks assessed/performed Overall Cognitive Status: Within Functional Limits for tasks assessed                                        General Comments      Exercises     Assessment/Plan    PT Assessment Patient needs continued PT services  PT Problem List Decreased strength;Decreased activity tolerance;Decreased balance;Decreased mobility       PT Treatment Interventions Gait training;Functional mobility training;Therapeutic activities;Therapeutic exercise;Patient/family education    PT Goals (Current goals can be found in the Care Plan section)  Acute Rehab PT Goals Patient Stated Goal: return home with family to assist PT Goal Formulation: With patient Time For Goal Achievement: 08/31/19 Potential to Achieve Goals: Good    Frequency Min 3X/week   Barriers to discharge        Co-evaluation               AM-PAC PT "6 Clicks" Mobility  Outcome Measure Help needed turning from your back to your side while in a flat bed without using bedrails?: A Little Help needed moving from lying on your back to sitting on the side of a flat bed without using bedrails?:  A Lot Help needed moving to and from a bed to a chair (including a wheelchair)?: A Lot Help needed standing up from a chair using your arms (e.g., wheelchair or bedside chair)?: A Lot Help needed to walk in hospital room?: A Lot Help needed climbing 3-5 steps with a railing? : Total 6 Click Score: 12    End of Session   Activity Tolerance: Patient tolerated treatment well;Patient limited by fatigue Patient left: in chair;with call bell/phone within reach Nurse Communication: Mobility status PT Visit Diagnosis: Other abnormalities of gait and mobility (R26.89);Unsteadiness on feet (R26.81);Muscle weakness (generalized) (M62.81)    Time: [id number] PT Time Calculation (min) (ACUTE ONLY): 31 min   Charges:   PT Evaluation $PT Eval Moderate Complexity: 1 Mod PT Treatments $Therapeutic Activity: 23-37 mins        11:31 AM, 08/31/19 Lonell Grandchild, MPT Physical Therapist with [facility] [phone] office [phone] mobile phone

## 2019-09-01 LAB — SEROTONIN RELEASE ASSAY (SRA)
SRA .2 IU/mL UFH Ser-aCnc: 1 % (ref 0–20)
SRA 100IU/mL UFH Ser-aCnc: 1 % (ref 0–20)

## 2019-09-03 ENCOUNTER — Inpatient Hospital Stay (HOSPITAL_COMMUNITY): Payer: Medicare Other

## 2019-09-03 ENCOUNTER — Other Ambulatory Visit: Payer: Self-pay

## 2019-09-03 ENCOUNTER — Other Ambulatory Visit (HOSPITAL_COMMUNITY): Payer: Self-pay

## 2019-09-03 DIAGNOSIS — R197 Diarrhea, unspecified: Secondary | ICD-10-CM | POA: Diagnosis not present

## 2019-09-03 DIAGNOSIS — E1142 Type 2 diabetes mellitus with diabetic polyneuropathy: Secondary | ICD-10-CM | POA: Diagnosis not present

## 2019-09-03 DIAGNOSIS — R1084 Generalized abdominal pain: Secondary | ICD-10-CM | POA: Diagnosis not present

## 2019-09-03 DIAGNOSIS — E785 Hyperlipidemia, unspecified: Secondary | ICD-10-CM | POA: Diagnosis not present

## 2019-09-03 DIAGNOSIS — I10 Essential (primary) hypertension: Secondary | ICD-10-CM | POA: Diagnosis not present

## 2019-09-03 DIAGNOSIS — R531 Weakness: Secondary | ICD-10-CM | POA: Diagnosis not present

## 2019-09-03 DIAGNOSIS — C8513 Unspecified B-cell lymphoma, intra-abdominal lymph nodes: Secondary | ICD-10-CM | POA: Diagnosis not present

## 2019-09-03 DIAGNOSIS — I700 Atherosclerosis of aorta: Secondary | ICD-10-CM | POA: Diagnosis not present

## 2019-09-03 DIAGNOSIS — Z5112 Encounter for antineoplastic immunotherapy: Secondary | ICD-10-CM | POA: Diagnosis not present

## 2019-09-03 DIAGNOSIS — F418 Other specified anxiety disorders: Secondary | ICD-10-CM | POA: Diagnosis not present

## 2019-09-03 DIAGNOSIS — R400 Somnolence: Secondary | ICD-10-CM | POA: Diagnosis not present

## 2019-09-03 DIAGNOSIS — Z79899 Other long term (current) drug therapy: Secondary | ICD-10-CM | POA: Diagnosis not present

## 2019-09-03 DIAGNOSIS — Z7984 Long term (current) use of oral hypoglycemic drugs: Secondary | ICD-10-CM | POA: Diagnosis not present

## 2019-09-03 DIAGNOSIS — R251 Tremor, unspecified: Secondary | ICD-10-CM | POA: Diagnosis not present

## 2019-09-03 DIAGNOSIS — Z5111 Encounter for antineoplastic chemotherapy: Secondary | ICD-10-CM | POA: Diagnosis present

## 2019-09-03 DIAGNOSIS — K625 Hemorrhage of anus and rectum: Secondary | ICD-10-CM

## 2019-09-03 DIAGNOSIS — E559 Vitamin D deficiency, unspecified: Secondary | ICD-10-CM | POA: Diagnosis not present

## 2019-09-03 DIAGNOSIS — E538 Deficiency of other specified B group vitamins: Secondary | ICD-10-CM | POA: Diagnosis not present

## 2019-09-03 LAB — CBC WITH DIFFERENTIAL/PLATELET
Abs Immature Granulocytes: 0.81 10*3/uL — ABNORMAL HIGH (ref 0.00–0.07)
Basophils Absolute: 0.1 10*3/uL (ref 0.0–0.1)
Basophils Relative: 1 %
Eosinophils Absolute: 0 10*3/uL (ref 0.0–0.5)
Eosinophils Relative: 0 %
HCT: 30.2 % — ABNORMAL LOW (ref 39.0–52.0)
Hemoglobin: 9.3 g/dL — ABNORMAL LOW (ref 13.0–17.0)
Immature Granulocytes: 5 %
Lymphocytes Relative: 6 %
Lymphs Abs: 1.1 10*3/uL (ref 0.7–4.0)
MCH: 31.2 pg (ref 26.0–34.0)
MCHC: 30.8 g/dL (ref 30.0–36.0)
MCV: 101.3 fL — ABNORMAL HIGH (ref 80.0–100.0)
Monocytes Absolute: 1.5 10*3/uL — ABNORMAL HIGH (ref 0.1–1.0)
Monocytes Relative: 8 %
Neutro Abs: 14.7 10*3/uL — ABNORMAL HIGH (ref 1.7–7.7)
Neutrophils Relative %: 80 %
Platelets: 717 10*3/uL — ABNORMAL HIGH (ref 150–400)
RBC: 2.98 MIL/uL — ABNORMAL LOW (ref 4.22–5.81)
RDW: 15.9 % — ABNORMAL HIGH (ref 11.5–15.5)
WBC: 18.2 10*3/uL — ABNORMAL HIGH (ref 4.0–10.5)
nRBC: 0.2 % (ref 0.0–0.2)

## 2019-09-03 LAB — SAMPLE TO BLOOD BANK

## 2019-09-03 NOTE — Progress Notes (Signed)
Spoke with the family regarding the patients blood in stool.  Family stated he had blood in his stool yesterday when they were cleaning him and again this morning.  The daughter stated she noticed a clot this morning and blood mixed in again with his bowel movement.  Blood described as bright red with bowel movements.    Labs reviewed with Dr. Delton Coombes with verbal order for the patient to return on Friday with labs and blood bank sample and to see the nurse practitioner.  Reviewed labs again with the family with understanding verbalized.  No s/s of distress noted with the patient and left by wheelchair.

## 2019-09-05 ENCOUNTER — Other Ambulatory Visit: Payer: Self-pay | Admitting: Adult Health

## 2019-09-05 ENCOUNTER — Ambulatory Visit (HOSPITAL_COMMUNITY): Payer: Medicare Other | Admitting: Hematology

## 2019-09-05 ENCOUNTER — Inpatient Hospital Stay (HOSPITAL_COMMUNITY): Payer: Medicare Other

## 2019-09-10 ENCOUNTER — Inpatient Hospital Stay (HOSPITAL_COMMUNITY): Payer: Medicare Other | Attending: Hematology

## 2019-09-10 ENCOUNTER — Inpatient Hospital Stay (HOSPITAL_BASED_OUTPATIENT_CLINIC_OR_DEPARTMENT_OTHER): Payer: Medicare Other | Admitting: Hematology

## 2019-09-10 ENCOUNTER — Encounter (HOSPITAL_COMMUNITY): Payer: Self-pay

## 2019-09-10 ENCOUNTER — Inpatient Hospital Stay (HOSPITAL_COMMUNITY): Payer: Medicare Other

## 2019-09-10 ENCOUNTER — Other Ambulatory Visit: Payer: Self-pay

## 2019-09-10 VITALS — BP 125/66 | HR 69 | Temp 97.1°F | Resp 18 | Wt 169.8 lb

## 2019-09-10 DIAGNOSIS — Z86711 Personal history of pulmonary embolism: Secondary | ICD-10-CM | POA: Diagnosis not present

## 2019-09-10 DIAGNOSIS — Z5111 Encounter for antineoplastic chemotherapy: Secondary | ICD-10-CM | POA: Insufficient documentation

## 2019-09-10 DIAGNOSIS — E119 Type 2 diabetes mellitus without complications: Secondary | ICD-10-CM | POA: Insufficient documentation

## 2019-09-10 DIAGNOSIS — B952 Enterococcus as the cause of diseases classified elsewhere: Secondary | ICD-10-CM | POA: Diagnosis not present

## 2019-09-10 DIAGNOSIS — I10 Essential (primary) hypertension: Secondary | ICD-10-CM | POA: Diagnosis not present

## 2019-09-10 DIAGNOSIS — E785 Hyperlipidemia, unspecified: Secondary | ICD-10-CM | POA: Insufficient documentation

## 2019-09-10 DIAGNOSIS — G479 Sleep disorder, unspecified: Secondary | ICD-10-CM | POA: Insufficient documentation

## 2019-09-10 DIAGNOSIS — C8513 Unspecified B-cell lymphoma, intra-abdominal lymph nodes: Secondary | ICD-10-CM | POA: Insufficient documentation

## 2019-09-10 DIAGNOSIS — Z7901 Long term (current) use of anticoagulants: Secondary | ICD-10-CM | POA: Insufficient documentation

## 2019-09-10 DIAGNOSIS — F418 Other specified anxiety disorders: Secondary | ICD-10-CM | POA: Diagnosis not present

## 2019-09-10 DIAGNOSIS — Z7984 Long term (current) use of oral hypoglycemic drugs: Secondary | ICD-10-CM | POA: Diagnosis not present

## 2019-09-10 DIAGNOSIS — Z79899 Other long term (current) drug therapy: Secondary | ICD-10-CM | POA: Diagnosis not present

## 2019-09-10 DIAGNOSIS — Z809 Family history of malignant neoplasm, unspecified: Secondary | ICD-10-CM | POA: Insufficient documentation

## 2019-09-10 DIAGNOSIS — Z95828 Presence of other vascular implants and grafts: Secondary | ICD-10-CM

## 2019-09-10 DIAGNOSIS — N390 Urinary tract infection, site not specified: Secondary | ICD-10-CM | POA: Diagnosis not present

## 2019-09-10 DIAGNOSIS — E559 Vitamin D deficiency, unspecified: Secondary | ICD-10-CM | POA: Insufficient documentation

## 2019-09-10 DIAGNOSIS — Z5112 Encounter for antineoplastic immunotherapy: Secondary | ICD-10-CM | POA: Insufficient documentation

## 2019-09-10 LAB — COMPREHENSIVE METABOLIC PANEL
ALT: 17 U/L (ref 0–44)
AST: 20 U/L (ref 15–41)
Albumin: 3 g/dL — ABNORMAL LOW (ref 3.5–5.0)
Alkaline Phosphatase: 126 U/L (ref 38–126)
Anion gap: 9 (ref 5–15)
BUN: 24 mg/dL — ABNORMAL HIGH (ref 8–23)
CO2: 25 mmol/L (ref 22–32)
Calcium: 10.2 mg/dL (ref 8.9–10.3)
Chloride: 103 mmol/L (ref 98–111)
Creatinine, Ser: 1.1 mg/dL (ref 0.61–1.24)
GFR calc Af Amer: >60 mL/min (ref >60)
GFR calc non Af Amer: >60 mL/min (ref >60)
Glucose, Bld: 178 mg/dL — ABNORMAL HIGH (ref 70–99)
Potassium: 3.9 mmol/L (ref 3.5–5.1)
Sodium: 137 mmol/L (ref 135–145)
Total Bilirubin: 0.3 mg/dL (ref 0.3–1.2)
Total Protein: 7.5 g/dL (ref 6.5–8.1)

## 2019-09-10 LAB — CBC WITH DIFFERENTIAL/PLATELET
Abs Immature Granulocytes: 0.54 10*3/uL — ABNORMAL HIGH (ref 0.00–0.07)
Basophils Absolute: 0.1 10*3/uL (ref 0.0–0.1)
Basophils Relative: 1 %
Eosinophils Absolute: 0 10*3/uL (ref 0.0–0.5)
Eosinophils Relative: 0 %
HCT: 29.9 % — ABNORMAL LOW (ref 39.0–52.0)
Hemoglobin: 9.5 g/dL — ABNORMAL LOW (ref 13.0–17.0)
Immature Granulocytes: 5 %
Lymphocytes Relative: 12 %
Lymphs Abs: 1.4 10*3/uL (ref 0.7–4.0)
MCH: 32.2 pg (ref 26.0–34.0)
MCHC: 31.8 g/dL (ref 30.0–36.0)
MCV: 101.4 fL — ABNORMAL HIGH (ref 80.0–100.0)
Monocytes Absolute: 0.9 10*3/uL (ref 0.1–1.0)
Monocytes Relative: 8 %
Neutro Abs: 9 10*3/uL — ABNORMAL HIGH (ref 1.7–7.7)
Neutrophils Relative %: 74 %
Platelets: 656 10*3/uL — ABNORMAL HIGH (ref 150–400)
RBC: 2.95 MIL/uL — ABNORMAL LOW (ref 4.22–5.81)
RDW: 17.9 % — ABNORMAL HIGH (ref 11.5–15.5)
WBC: 12 10*3/uL — ABNORMAL HIGH (ref 4.0–10.5)
nRBC: 0 % (ref 0.0–0.2)

## 2019-09-10 LAB — LACTATE DEHYDROGENASE: LDH: 173 U/L (ref 98–192)

## 2019-09-10 LAB — MAGNESIUM: Magnesium: 2.1 mg/dL (ref 1.7–2.4)

## 2019-09-10 LAB — URIC ACID: Uric Acid, Serum: 2.1 mg/dL — ABNORMAL LOW (ref 3.7–8.6)

## 2019-09-10 LAB — PHOSPHORUS: Phosphorus: 3.4 mg/dL (ref 2.5–4.6)

## 2019-09-10 MED ORDER — ACETAMINOPHEN 325 MG PO TABS
650.0000 mg | ORAL_TABLET | Freq: Once | ORAL | Status: AC
  Administered 2019-09-10: 650 mg via ORAL

## 2019-09-10 MED ORDER — ACETAMINOPHEN 325 MG PO TABS
ORAL_TABLET | ORAL | Status: AC
  Filled 2019-09-10: qty 2

## 2019-09-10 MED ORDER — SODIUM CHLORIDE 0.9 % IV SOLN
Freq: Once | INTRAVENOUS | Status: AC
  Administered 2019-09-10: 09:00:00 via INTRAVENOUS

## 2019-09-10 MED ORDER — FAMOTIDINE IN NACL 20-0.9 MG/50ML-% IV SOLN
20.0000 mg | Freq: Once | INTRAVENOUS | Status: AC
  Administered 2019-09-10: 20 mg via INTRAVENOUS

## 2019-09-10 MED ORDER — MEPERIDINE HCL 25 MG/ML IJ SOLN: 25.0000 mg | Freq: Once | INTRAMUSCULAR | Status: DC

## 2019-09-10 MED ORDER — DOXORUBICIN HCL CHEMO IV INJECTION 2 MG/ML
25.0000 mg/m2 | Freq: Once | INTRAVENOUS | Status: AC
  Administered 2019-09-10: 52 mg via INTRAVENOUS
  Filled 2019-09-10: qty 26

## 2019-09-10 MED ORDER — SODIUM CHLORIDE 0.9% FLUSH
10.0000 mL | INTRAVENOUS | Status: DC | PRN
  Administered 2019-09-10: 10 mL
  Filled 2019-09-10: qty 10

## 2019-09-10 MED ORDER — DIPHENHYDRAMINE HCL 50 MG/ML IJ SOLN
INTRAMUSCULAR | Status: AC
  Filled 2019-09-10: qty 1

## 2019-09-10 MED ORDER — PALONOSETRON HCL INJECTION 0.25 MG/5ML
INTRAVENOUS | Status: AC
  Filled 2019-09-10: qty 5

## 2019-09-10 MED ORDER — PALONOSETRON HCL INJECTION 0.25 MG/5ML
0.2500 mg | Freq: Once | INTRAVENOUS | Status: AC
  Administered 2019-09-10: 0.25 mg via INTRAVENOUS

## 2019-09-10 MED ORDER — DIPHENHYDRAMINE HCL 50 MG/ML IJ SOLN
50.0000 mg | Freq: Once | INTRAMUSCULAR | Status: AC
  Administered 2019-09-10: 50 mg via INTRAVENOUS

## 2019-09-10 MED ORDER — SODIUM CHLORIDE 0.9 % IV SOLN
490.0000 mg/m2 | Freq: Once | INTRAVENOUS | Status: AC
  Administered 2019-09-10: 1000 mg via INTRAVENOUS
  Filled 2019-09-10: qty 50

## 2019-09-10 MED ORDER — SODIUM CHLORIDE 0.9 % IV SOLN
375.0000 mg/m2 | Freq: Once | INTRAVENOUS | Status: AC
  Administered 2019-09-10: 800 mg via INTRAVENOUS
  Filled 2019-09-10: qty 30

## 2019-09-10 MED ORDER — VINCRISTINE SULFATE CHEMO INJECTION 1 MG/ML
1.0000 mg | Freq: Once | INTRAVENOUS | Status: AC
  Administered 2019-09-10: 1 mg via INTRAVENOUS
  Filled 2019-09-10: qty 1

## 2019-09-10 MED ORDER — MEPERIDINE HCL 50 MG/ML IJ SOLN
25.0000 mg | Freq: Once | INTRAMUSCULAR | Status: AC
  Administered 2019-09-10: 25 mg via INTRAVENOUS
  Filled 2019-09-10: qty 1

## 2019-09-10 MED ORDER — HEPARIN SOD (PORK) LOCK FLUSH 100 UNIT/ML IV SOLN
500.0000 [IU] | Freq: Once | INTRAVENOUS | Status: AC | PRN
  Administered 2019-09-10: 500 [IU]

## 2019-09-10 MED ORDER — FAMOTIDINE IN NACL 20-0.9 MG/50ML-% IV SOLN
INTRAVENOUS | Status: AC
  Filled 2019-09-10: qty 50

## 2019-09-10 MED ORDER — SODIUM CHLORIDE 0.9 % IV SOLN
Freq: Once | INTRAVENOUS | Status: AC
  Administered 2019-09-10: 10:00:00 via INTRAVENOUS
  Filled 2019-09-10: qty 5

## 2019-09-10 NOTE — Progress Notes (Signed)
09/10/19  Rigors with Cycle #1 Rituximab and resolved with Demerol 12.5 mg IV push x 2.  Received orders to add Demerol 25 mg IV push as pre-med for Rituximab today.  Maintain dose of Cyclophosphamide at 500 mg/m2 for today per NP patient cannot clinically tolerate full dose cyclophosphamide at this time.- dose adjusted to reflect this.  T.OReynolds Bowl, NP-C/ Henreitta Leber, PharmD

## 2019-09-10 NOTE — Patient Instructions (Signed)
[facility] Discharge Instructions for Patients Receiving Chemotherapy   Beginning January 23rd 2017 lab work for the [facility] will be done in the  Main lab at [facility] on 1st floor. If you have a lab appointment with the [facility] please come in thru the  Main Entrance and check in at the main information desk   Today you received the following chemotherapy agents Rituxan,Adriamycin,Vincristine and Cytoxan. Follow-up as scheduled. Call clinic for any questions or concerns  To help prevent nausea and vomiting after your treatment, we encourage you to take your nausea medication   If you develop nausea and vomiting, or diarrhea that is not controlled by your medication, call the clinic.  The clinic phone number is [phone]. Office hours are Monday-Friday 8:30am-5:00pm.  BELOW ARE SYMPTOMS THAT SHOULD BE REPORTED IMMEDIATELY:  *FEVER GREATER THAN 101.0 F  *CHILLS WITH OR WITHOUT FEVER  NAUSEA AND VOMITING THAT IS NOT CONTROLLED WITH YOUR NAUSEA MEDICATION  *UNUSUAL SHORTNESS OF BREATH  *UNUSUAL BRUISING OR BLEEDING  TENDERNESS IN MOUTH AND THROAT WITH OR WITHOUT PRESENCE OF ULCERS  *URINARY PROBLEMS  *BOWEL PROBLEMS  UNUSUAL RASH Items with * indicate a potential emergency and should be followed up as soon as possible. If you have an emergency after office hours please contact your primary care physician or go to the nearest emergency department.  Please call the clinic during office hours if you have any questions or concerns.   You may also contact the Patient Navigator at [phone] should you have any questions or need assistance in obtaining follow up care.      Resources For Cancer Patients and their Caregivers ? American Cancer Society: Can assist with transportation, wigs, general needs, runs Look Good Feel Better.        ([phone] ? Cancer Care: Provides financial assistance, online support groups, medication/co-pay  assistance.  1-800-813-HOPE 516-850-8036) ? [place] Assists [place] cancer patients and their families through emotional , educational and financial support.  812-178-3896 ? [place] DSS Where to apply for food stamps, Medicaid and utility assistance. (352)407-3901 ? [facility]: Transportation to medical appointments. 740 546 2252 ? Social Security Administration: May apply for disability if have a Stage IV cancer. (240) 535-7264 [phone]) [phone] ? [employer], Disability and Transit Services: Assists with nutrition, care and transit needs. 407-649-5715

## 2019-09-10 NOTE — Progress Notes (Signed)
0914 Labs reviewed with and pt seen by RNester NP and pt approved for chemo tx today per NP                                   Daniel Richards tolerated chemo tx well without complaints or incident. VSS upon discharge.Foley catheter intact and draining clear yellow urine. Pt discharged via wheelchair in satisfactory condition accompanied by his son

## 2019-09-10 NOTE — Progress Notes (Signed)
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, unspecified B-cell lymphoma type   08/04/2019 Initial Diagnosis   B-cell lymphoma ([place])   08/20/2019 -  Chemotherapy   The patient had DOXOrubicin (ADRIAMYCIN) chemo injection 52 mg, 25 mg/m2 = 52 mg (50 % of original dose 50 mg/m2), Intravenous,  Once, 2 of 6 cycles Dose modification: 25 mg/m2 (50 % of original dose 50 mg/m2, Cycle 1, Reason: Provider Judgment) Administration: 52 mg (08/21/2019) palonosetron (ALOXI) injection 0.25 mg, 0.25 mg, Intravenous,  Once, 2 of 6 cycles Administration: 0.25 mg (08/21/2019) pegfilgrastim-jmdb (FULPHILA) injection 6 mg, 6 mg, Subcutaneous,  Once, 2 of 6 cycles Administration: 6 mg (08/22/2019) vinCRIStine (ONCOVIN) 1 mg in sodium chloride 0.9 % 50 mL chemo infusion, 1 mg (50 % of original dose 2 mg), Intravenous,  Once, 2 of 6 cycles Dose modification: 1 mg (50 % of original dose 2 mg, Cycle 1, Reason: Provider Judgment) Administration: 1 mg (08/21/2019) cyclophosphamide (CYTOXAN) 1,040 mg in sodium chloride 0.9 % 250 mL chemo infusion, 500 mg/m2 = 1,040 mg (66.7 % of original dose 750 mg/m2), Intravenous,  Once, 2 of 6 cycles Dose modification: 500 mg/m2 (66.7 % of original dose 750 mg/m2, Cycle 1, Reason: Provider Judgment) Administration: 1,000 mg (08/21/2019) fosaprepitant (EMEND) 150 mg, dexamethasone (DECADRON) 12 mg in sodium chloride 0.9 % 145 mL IVPB, , Intravenous,  Once, 2 of 6 cycles Administration:  (08/21/2019) riTUXimab-pvvr (RUXIENCE) 800 mg in sodium chloride 0.9 % 250 mL (2.4242 mg/mL) infusion, 375 mg/m2 = 800 mg,  Intravenous,  Once, 2 of 6 cycles Administration: 800 mg (08/20/2019)  for chemotherapy treatment.    Diffuse large B cell lymphoma ([place])  08/20/2019 Initial Diagnosis   Diffuse large B cell lymphoma ([place])   08/20/2019 Cancer Staging   Staging form: Hodgkin and Non-Hodgkin Lymphoma, AJCC 8th Edition - Clinical stage from 08/20/2019: Stage IV (Diffuse large B-cell lymphoma) - Signed by Derek Jack, MD on 08/20/2019      CANCER STAGING: Cancer Staging Diffuse large B cell lymphoma ([place]) Staging form: Hodgkin and Non-Hodgkin Lymphoma, AJCC 8th Edition - Clinical stage from 08/20/2019: Stage IV (Diffuse large B-cell lymphoma) - Signed by Derek Jack, MD on 08/20/2019    INTERVAL HISTORY:  [name] 73 y.o. male presents today for follow-up.  He reports overall doing well.  He was recently admitted to the hospital for fever of 104 F.  Patient was found to have a urinary tract infection,  Enterococcus and Proteus, and was started on antibiotics.  He was also found to have a O2 saturation of 88%.  CT angiogram was performed and revealed acute bilateral pulmonary emboli including a small saddle embolus in the distal left pulmonary artery with evidence of right heart strain.  He was started on Eliquis.  Today, he reports doing much better.  He denies any further fever since being discharged from the hospital.  He has completed all antibiotics.  He denies any chest pain, shortness of breath, lightheadedness or dizziness.  He states he is ready to proceed with treatment today.  REVIEW OF SYSTEMS:  Review of Systems  Constitutional: Positive for fatigue.  HENT:  Negative.   Eyes: Negative.   Respiratory: Negative.   Cardiovascular: Negative.   Gastrointestinal: Negative.   Endocrine: Negative.   Genitourinary: Positive for bladder incontinence.   Musculoskeletal: Positive for arthralgias, gait problem and myalgias.  Skin: Negative.   Neurological: Positive for extremity  weakness and gait problem.  Hematological: Negative.   Psychiatric/Behavioral: Negative.      PAST MEDICAL/SURGICAL HISTORY:  Past Medical History:  Diagnosis Date   Anxiety and depression    Cancer ([place])    lymphoma   Diabetes mellitus 06/09/2011   pt taking metformin   DJD of shoulder    right    Hyperlipidemia    Hypertension    Migraines    Port-A-Cath in place 08/13/2019   Tremor    Vitamin D deficiency    Past Surgical History:  Procedure Laterality Date   BACK SURGERY     KNEE SURGERY     PORTACATH PLACEMENT Left 08/08/2019   Procedure: INSERTION PORT-A-CATH;  Surgeon: Aviva Signs, MD;  Location: [facility];  Service: General;  Laterality: Left;   SHOULDER SURGERY       SOCIAL HISTORY:  Social History   Socioeconomic History   Marital status: Divorced    Spouse name: Not on file   Number of children: 2   Years of education: 12+   Highest education level: Not on file  Occupational History   Not on file  Social Needs   Financial resource strain: Somewhat hard   Food insecurity    Worry: Never true    Inability: Never true   Transportation needs    Medical: No    Non-medical: No  Tobacco Use   Smoking status: Never Smoker   Smokeless tobacco: Never Used  Substance and Sexual Activity   Alcohol use: No   Drug use: No   Sexual activity: Not Currently  Lifestyle   Physical activity    Days per week: 0 days    Minutes per session: 0 min   Stress: Not at all  Relationships   Social connections    Talks on phone: Never    Gets together: Twice a week    Attends religious service: More than 4 times per year    Active member of club or organization: No    Attends meetings of clubs or organizations: Never    Relationship status: Divorced   Intimate partner violence    Fear of current or ex partner: No    Emotionally abused: No    Physically abused: No    Forced sexual activity: No  Other Topics Concern   Not on file    Social History Narrative   Lives at home with son.   Caffeine use: Drinks 1 cup coffee (3 cups per week-decaf)    FAMILY HISTORY:  Family History  Problem Relation Age of Onset   Cancer Sister    Cancer Sister    Cancer Brother    Heart attack Father    Cancer Brother    Heart attack Brother    Healthy Son    Healthy Daughter    Migraines Neg Hx     CURRENT MEDICATIONS:  Outpatient Encounter Medications as of 09/10/2019  Medication Sig Note   acetaminophen (TYLENOL) 325 MG tablet Take 2 tablets (650 mg total) by mouth every 6 (six) hours as needed for mild pain, fever or headache.    allopurinol (ZYLOPRIM) 300 MG tablet Take 1 tablet (300 mg total) by mouth daily.    apixaban (  ELIQUIS) 5 MG TABS tablet Take 2 tablets (10 mg total) by mouth 2 (two) times daily for 7 days.    apixaban (ELIQUIS) 5 MG TABS tablet Take 1 tablet (5 mg total) by mouth 2 (two) times daily.    calcitRIOL (ROCALTROL) 0.5 MCG capsule Take 1 capsule (0.5 mcg total) by mouth daily.    citalopram (CELEXA) 20 MG tablet Take 1 tablet (20 mg total) by mouth at bedtime.    CYCLOPHOSPHAMIDE IV Inject into the vein every 21 ( twenty-one) days.    DOXORUBICIN HCL IV Inject into the vein every 21 ( twenty-one) days.    folic acid (FOLVITE) 1 MG tablet Take 1 tablet (1 mg total) by mouth daily.    gabapentin (NEURONTIN) 300 MG capsule Take 2 capsules (600 mg total) by mouth 3 (three) times daily.    GAVILAX 17 GM/SCOOP powder SMARTSIG:17 Gram(s) By Mouth Daily PRN    INVOKANA 100 MG TABS tablet Take 100 mg by mouth daily.    lidocaine-prilocaine (EMLA) cream Apply a small amount to port a cath site and cover with plastic wrap 1 hour prior to chemotherapy appointments    lisinopril (ZESTRIL) 20 MG tablet Take 20 mg by mouth daily.     metFORMIN (GLUCOPHAGE) 500 MG tablet Take 1 tablet (500 mg total) by mouth daily.    NON FORMULARY Diet: Regular, NAS, Consistent Carbohydrate    ondansetron  (ZOFRAN) 4 MG tablet Take 1 tablet (4 mg total) by mouth every 6 (six) hours as needed for nausea.    pantoprazole (PROTONIX) 40 MG tablet Take 1 tablet (40 mg total) by mouth 2 (two) times daily before a meal.    polyethylene glycol (MIRALAX / GLYCOLAX) 17 g packet Take 17 g by mouth daily as needed for mild constipation.    predniSONE (DELTASONE) 20 MG tablet Take 3 tablets (60 mg total) by mouth daily. Take on days 1-5 of chemotherapy. (Patient taking differently: Take 60 mg by mouth as directed. Take on days 1-5 of chemotherapy.) 08/15/2019: Will start when he prepare for chemotherapy on Wed 08/20/19   primidone (MYSOLINE) 50 MG tablet Take 1.5 tablets (75 mg total) by mouth every 8 (eight) hours.    prochlorperazine (COMPAZINE) 10 MG tablet Take 1 tablet (10 mg total) by mouth every 6 (six) hours as needed (Nausea or vomiting).    propranolol (INDERAL) 40 MG tablet Take 1 tablet (40 mg total) by mouth 2 (two) times daily.    riTUXimab in sodium chloride 0.9 % 250 mL Inject into the vein every 21 ( twenty-one) days.    sucralfate (CARAFATE) 1 GM/10ML suspension Take 10 mLs (1 g total) by mouth 4 (four) times daily -  with meals and at bedtime.    temazepam (RESTORIL) 15 MG capsule Take 1 capsule (15 mg total) by mouth at bedtime as needed for sleep.    topiramate (TOPAMAX) 100 MG tablet Take 2 tablets (200 mg total) by mouth at bedtime.    vinCRIStine 2 mg in sodium chloride 0.9 % 50 mL Inject 2 mg into the vein every 21 ( twenty-one) days.    No facility-administered encounter medications on file as of 09/10/2019.     ALLERGIES:  Allergies  Allergen Reactions   Vancomycin Hives    Patient received Benadryl to treat the hives     PHYSICAL EXAM:  ECOG Performance status: 3  There were no vitals filed for this visit. There were no vitals filed for this visit.  Physical Exam  Constitutional:      Appearance: Normal appearance.  HENT:     Head: Normocephalic.     Right  Ear: External ear normal.     Left Ear: External ear normal.     Nose: Nose normal.     Mouth/Throat:     Mouth: Mucous membranes are moist.  Eyes:     Conjunctiva/sclera: Conjunctivae normal.  Neck:     Musculoskeletal: Normal range of motion.  Cardiovascular:     Rate and Rhythm: Normal rate and regular rhythm.     Pulses: Normal pulses.     Heart sounds: Normal heart sounds.  Pulmonary:     Effort: Pulmonary effort is normal.     Breath sounds: Normal breath sounds.  Abdominal:     General: Bowel sounds are normal.  Musculoskeletal:     Comments: Decreased ROM   Skin:    General: Skin is warm.  Neurological:     General: No focal deficit present.     Mental Status: He is alert and oriented to person, place, and time.     Comments: Generalized tremors   Psychiatric:        Mood and Affect: Mood normal.        Behavior: Behavior normal.        Thought Content: Thought content normal.        Judgment: Judgment normal.      LABORATORY DATA:  I have reviewed the labs as listed.  CBC    Component Value Date/Time   WBC 12.0 (H) 09/10/2019 0759   RBC 2.95 (L) 09/10/2019 0759   HGB 9.5 (L) 09/10/2019 0759   HCT 29.9 (L) 09/10/2019 0759   PLT 656 (H) 09/10/2019 0759   MCV 101.4 (H) 09/10/2019 0759   MCH 32.2 09/10/2019 0759   MCHC 31.8 09/10/2019 0759   RDW 17.9 (H) 09/10/2019 0759   LYMPHSABS 1.4 09/10/2019 0759   MONOABS 0.9 09/10/2019 0759   EOSABS 0.0 09/10/2019 0759   BASOSABS 0.1 09/10/2019 0759   CMP Latest Ref Rng & Units 09/10/2019 08/30/2019 08/29/2019  Glucose 70 - 99 mg/dL 178(H) 126(H) 145(H)  BUN 8 - 23 mg/dL 24(H) 19 19  Creatinine 0.61 - 1.24 mg/dL 1.10 1.14 1.14  Sodium 135 - 145 mmol/L 137 137 136  Potassium 3.5 - 5.1 mmol/L 3.9 3.6 3.5  Chloride 98 - 111 mmol/L 103 106 107  CO2 22 - 32 mmol/L 25 23 22   Calcium 8.9 - 10.3 mg/dL 10.2 9.2 9.3  Total Protein 6.5 - 8.1 g/dL 7.5 - -  Total Bilirubin 0.3 - 1.2 mg/dL 0.3 - -  Alkaline Phos 38 - 126  U/L 126 - -  AST 15 - 41 U/L 20 - -  ALT 0 - 44 U/L 17 - -         ASSESSMENT & PLAN:   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  Stage IV diffuse large B-cell lymphoma: -61-monthhistory of generalized weakness, no B symptoms. -CTAP showed retroperitoneal, left pelvic, external iliac adenopathy, largest lymph node measuring 7.5 cm.  LDH was normal. -MRI of the thoracic and lumbar spine did not show any spinal involvement. - IR biopsy of the left retroperitoneal lymph node on 07/30/2019, consistent with high-grade lymphoma.  Ki-67 was 40-50%.  Positive for CD20, CD5 DM, BCL-2, BCL 6.  Negative for CD10, CD3. -PET scan on 08/07/2019 showed lymphoma within nodal stations of the neck, chest, abdomen and pelvis.  Heterogeneous foci of marrow hypermetabolism consistent with lymphomatous involvement.  Right sphenoid sinus soft tissue density and hypermetabolism. - Lumbar puncture negative for lymphoma involvement. -2D echo on 08/05/2019 shows EF 65-70%. -He was recently admitted to the hospital with hypercalcemia for couple of days and received IV hydration. - FISH staining for high-grade lymphoma is negative. - Received cycle 1 mini R-CHOP on 08/20/2019. -Recently admitted to the hospital from 10/20 -08/31/2019 for fever secondary UTI and acute bilateral pulmonary embolism.  He completed antibiotics and is currently taking Eliquis 5 mg twice daily. -Labs are stable.  Patient is clinically well.  Patient may proceed with cycle 2 today. -Return to clinic in 4 weeks  2.  Hypercalcemia: - Resolved    3.  TLS prophylaxis: -He received rasburicase 3 mg today.  He will continue allopurinol.  His uric acid is normal today.   4.  Sleep problems: - We will start him on Restoril 15 mg and see if it helps.  5.  Hypophosphatemia: - Phosphorous is with in normal.           [place] [phone]

## 2019-09-10 NOTE — Assessment & Plan Note (Signed)
1.  Stage IV diffuse large B-cell lymphoma: -19-monthhistory of generalized weakness, no B symptoms. -CTAP showed retroperitoneal, left pelvic, external iliac adenopathy, largest lymph node measuring 7.5 cm.  LDH was normal. -MRI of the thoracic and lumbar spine did not show any spinal involvement. - IR biopsy of the left retroperitoneal lymph node on 07/30/2019, consistent with high-grade lymphoma.  Ki-67 was 40-50%.  Positive for CD20, CD5 DM, BCL-2, BCL 6.  Negative for CD10, CD3. -PET scan on 08/07/2019 showed lymphoma within nodal stations of the neck, chest, abdomen and pelvis.  Heterogeneous foci of marrow hypermetabolism consistent with lymphomatous involvement.  Right sphenoid sinus soft tissue density and hypermetabolism. - Lumbar puncture negative for lymphoma involvement. -2D echo on 08/05/2019 shows EF 65-70%. -He was recently admitted to the hospital with hypercalcemia for couple of days and received IV hydration. - FISH staining for high-grade lymphoma is negative. - Received cycle 1 mini R-CHOP on 08/20/2019. -Recently admitted to the hospital from 10/20 -08/31/2019 for fever secondary UTI and acute bilateral pulmonary embolism.  He completed antibiotics and is currently taking Eliquis 5 mg twice daily. -Labs are stable.  Patient is clinically well.  Patient may proceed with cycle 2 today. -Return to clinic in 4 weeks  2.  Hypercalcemia: - Resolved    3.  TLS prophylaxis: -He received rasburicase 3 mg today.  He will continue allopurinol.  His uric acid is normal today.   4.  Sleep problems: - We will start him on Restoril 15 mg and see if it helps.  5.  Hypophosphatemia: - Phosphorous is with in normal.

## 2019-09-12 ENCOUNTER — Other Ambulatory Visit: Payer: Self-pay

## 2019-09-12 ENCOUNTER — Inpatient Hospital Stay (HOSPITAL_COMMUNITY): Payer: Medicare Other

## 2019-09-12 VITALS — BP 113/66 | HR 66 | Temp 97.7°F | Resp 18

## 2019-09-12 DIAGNOSIS — Z5111 Encounter for antineoplastic chemotherapy: Secondary | ICD-10-CM | POA: Diagnosis not present

## 2019-09-12 DIAGNOSIS — C8513 Unspecified B-cell lymphoma, intra-abdominal lymph nodes: Secondary | ICD-10-CM

## 2019-09-12 DIAGNOSIS — Z95828 Presence of other vascular implants and grafts: Secondary | ICD-10-CM

## 2019-09-12 MED ORDER — PEGFILGRASTIM-JMDB 6 MG/0.6ML ~~LOC~~ SOSY
6.0000 mg | PREFILLED_SYRINGE | Freq: Once | SUBCUTANEOUS | Status: AC
  Administered 2019-09-12: 6 mg via SUBCUTANEOUS
  Filled 2019-09-12: qty 0.6

## 2019-09-12 NOTE — Progress Notes (Signed)
Patient tolerated injection with no complaints voiced.  Site clean and dry with no bruising or swelling noted at site.  Band aid applied.  Vss with discharge and left ambulatory with no s/s of distress noted.  

## 2019-09-15 ENCOUNTER — Other Ambulatory Visit: Payer: Self-pay | Admitting: Adult Health

## 2019-09-15 ENCOUNTER — Other Ambulatory Visit (HOSPITAL_COMMUNITY): Payer: Self-pay | Admitting: Hematology

## 2019-09-15 ENCOUNTER — Emergency Department (HOSPITAL_COMMUNITY)
Admission: EM | Admit: 2019-09-15 | Discharge: 2019-09-15 | Disposition: A | Discharge status: Home / Self Care | Payer: Medicare Other | Attending: Emergency Medicine | Admitting: Emergency Medicine

## 2019-09-15 ENCOUNTER — Encounter (HOSPITAL_COMMUNITY): Payer: Self-pay

## 2019-09-15 ENCOUNTER — Other Ambulatory Visit: Payer: Self-pay

## 2019-09-15 DIAGNOSIS — Z7984 Long term (current) use of oral hypoglycemic drugs: Secondary | ICD-10-CM | POA: Insufficient documentation

## 2019-09-15 DIAGNOSIS — Z8572 Personal history of non-Hodgkin lymphomas: Secondary | ICD-10-CM | POA: Insufficient documentation

## 2019-09-15 DIAGNOSIS — Y731 Therapeutic (nonsurgical) and rehabilitative gastroenterology and urology devices associated with adverse incidents: Secondary | ICD-10-CM | POA: Diagnosis not present

## 2019-09-15 DIAGNOSIS — E119 Type 2 diabetes mellitus without complications: Secondary | ICD-10-CM | POA: Insufficient documentation

## 2019-09-15 DIAGNOSIS — N3000 Acute cystitis without hematuria: Secondary | ICD-10-CM | POA: Insufficient documentation

## 2019-09-15 DIAGNOSIS — Z79899 Other long term (current) drug therapy: Secondary | ICD-10-CM | POA: Diagnosis not present

## 2019-09-15 DIAGNOSIS — C8590 Non-Hodgkin lymphoma, unspecified, unspecified site: Secondary | ICD-10-CM

## 2019-09-15 DIAGNOSIS — T83091A Other mechanical complication of indwelling urethral catheter, initial encounter: Secondary | ICD-10-CM | POA: Insufficient documentation

## 2019-09-15 DIAGNOSIS — I10 Essential (primary) hypertension: Secondary | ICD-10-CM | POA: Diagnosis not present

## 2019-09-15 DIAGNOSIS — Z7901 Long term (current) use of anticoagulants: Secondary | ICD-10-CM | POA: Diagnosis not present

## 2019-09-15 DIAGNOSIS — R339 Retention of urine, unspecified: Secondary | ICD-10-CM | POA: Diagnosis not present

## 2019-09-15 DIAGNOSIS — R300 Dysuria: Secondary | ICD-10-CM | POA: Diagnosis present

## 2019-09-15 LAB — URINALYSIS, ROUTINE W REFLEX MICROSCOPIC
Bilirubin Urine: NEGATIVE
Glucose, UA: NEGATIVE mg/dL
Ketones, ur: NEGATIVE mg/dL
Nitrite: NEGATIVE
Protein, ur: 100 mg/dL — AB
RBC / HPF: >50 RBC/hpf — ABNORMAL HIGH (ref 0–5)
Specific Gravity, Urine: 1.013 (ref 1.005–1.030)
WBC, UA: >50 WBC/hpf — ABNORMAL HIGH (ref 0–5)
pH: 9 — ABNORMAL HIGH (ref 5.0–8.0)

## 2019-09-15 LAB — CBC WITH DIFFERENTIAL/PLATELET
Abs Immature Granulocytes: 2.87 10*3/uL — ABNORMAL HIGH (ref 0.00–0.07)
Basophils Absolute: 0 10*3/uL (ref 0.0–0.1)
Basophils Relative: 0 %
Eosinophils Absolute: 0 10*3/uL (ref 0.0–0.5)
Eosinophils Relative: 0 %
HCT: 28.2 % — ABNORMAL LOW (ref 39.0–52.0)
Hemoglobin: 9.3 g/dL — ABNORMAL LOW (ref 13.0–17.0)
Immature Granulocytes: 5 %
Lymphocytes Relative: 1 %
Lymphs Abs: 0.3 10*3/uL — ABNORMAL LOW (ref 0.7–4.0)
MCH: 33 pg (ref 26.0–34.0)
MCHC: 33 g/dL (ref 30.0–36.0)
MCV: 100 fL (ref 80.0–100.0)
Monocytes Absolute: 0.6 10*3/uL (ref 0.1–1.0)
Monocytes Relative: 1 %
Neutro Abs: 49.7 10*3/uL — ABNORMAL HIGH (ref 1.7–7.7)
Neutrophils Relative %: 93 %
Platelets: 318 10*3/uL (ref 150–400)
RBC: 2.82 MIL/uL — ABNORMAL LOW (ref 4.22–5.81)
RDW: 17.5 % — ABNORMAL HIGH (ref 11.5–15.5)
WBC: 53.5 10*3/uL (ref 4.0–10.5)
nRBC: 0 % (ref 0.0–0.2)

## 2019-09-15 LAB — BASIC METABOLIC PANEL
Anion gap: 9 (ref 5–15)
BUN: 56 mg/dL — ABNORMAL HIGH (ref 8–23)
CO2: 24 mmol/L (ref 22–32)
Calcium: 8.9 mg/dL (ref 8.9–10.3)
Chloride: 102 mmol/L (ref 98–111)
Creatinine, Ser: 1.42 mg/dL — ABNORMAL HIGH (ref 0.61–1.24)
GFR calc Af Amer: 56 mL/min — ABNORMAL LOW (ref >60)
GFR calc non Af Amer: 48 mL/min — ABNORMAL LOW (ref >60)
Glucose, Bld: 255 mg/dL — ABNORMAL HIGH (ref 70–99)
Potassium: 4.5 mmol/L (ref 3.5–5.1)
Sodium: 135 mmol/L (ref 135–145)

## 2019-09-15 MED ORDER — CEPHALEXIN 500 MG PO CAPS
500.0000 mg | ORAL_CAPSULE | Freq: Once | ORAL | Status: AC
  Administered 2019-09-15: 500 mg via ORAL
  Filled 2019-09-15: qty 1

## 2019-09-15 MED ORDER — CEPHALEXIN 500 MG PO CAPS: 500.0000 mg | ORAL_CAPSULE | Freq: Two times a day (BID) | ORAL | 0 refills | Status: AC

## 2019-09-15 MED ORDER — SODIUM CHLORIDE 0.9 % IV BOLUS
1000.0000 mL | Freq: Once | INTRAVENOUS | Status: AC
  Administered 2019-09-15: 1000 mL via INTRAVENOUS

## 2019-09-15 MED ORDER — HEPARIN SOD (PORK) LOCK FLUSH 100 UNIT/ML IV SOLN
500.0000 [IU] | Freq: Once | INTRAVENOUS | Status: AC
  Administered 2019-09-15: 500 [IU]
  Filled 2019-09-15: qty 5

## 2019-09-15 NOTE — ED Notes (Signed)
1200 ML output of foley catheter

## 2019-09-15 NOTE — ED Triage Notes (Signed)
Pt brought in by EMS due to decrease urinary output for 24 hours in foley catheter. Pt reports that is burns

## 2019-09-15 NOTE — ED Provider Notes (Signed)
[facility] EMERGENCY DEPARTMENT Provider Note   CSN: [id number] Arrival date & time: 09/15/19  1236     History   Chief Complaint Chief Complaint  Patient presents with  . Urinary Retention    HPI Daniel Richards is a 73 y.o. male.     Patient complains of dysuria.  Patient has recently finished antibiotics for urinary tract infection.  He was sent over here for decreased urine output  The history is provided by the patient.  Dysuria Presenting symptoms: dysuria   Context: not after injury   Relieved by:  Nothing Worsened by:  Nothing Ineffective treatments:  None tried Associated symptoms: abdominal pain   Associated symptoms: no diarrhea, no hematuria and no urinary frequency   Risk factors: no bladder surgery     Past Medical History:  Diagnosis Date  . Anxiety and depression   . Cancer ([place])    lymphoma  . Diabetes mellitus 06/09/2011   pt taking metformin  . DJD of shoulder    right   . Hyperlipidemia   . Hypertension   . Migraines   . Port-A-Cath in place 08/13/2019  . Tremor   . Vitamin D deficiency     Patient Active Problem List   Diagnosis Date Noted  . Acute saddle pulmonary embolism with acute cor pulmonale (HCC)   . Urinary tract infection associated with indwelling urethral catheter ([place])   . Febrile neutropenia ([place]) 08/26/2019  . PE (pulmonary thromboembolism) ([place]) 08/26/2019  . Neutropenia, drug-induced ([place]) 08/20/2019  . 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 Port-A-Cath in place 08/13/2019  . Anemia of chronic disease 08/07/2019  . Major depression, recurrent, chronic ([place]) 08/07/2019  . BPH without urinary obstruction 08/07/2019  . Aortic atherosclerosis ([place]) 08/07/2019  . Diabetic peripheral neuropathy ([place]) 08/07/2019  . Hypertension associated with type 2 diabetes mellitus ([place]) 08/07/2019  . 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 Odynophagia    . Acute kidney injury ([place])   . Falls frequently   . Generalized abdominal pain   . Goals of care, counseling/discussion   . Palliative care by specialist   . Folate deficiency   . Generalized weakness 07/25/2019  . Intra-abdominal lymphadenopathy 07/25/2019  . Retroperitoneal lymphadenopathy 07/25/2019  . Malnutrition of moderate degree 07/25/2019  . Tremor   . Hypercalcemia 07/24/2019  . B12 deficiency 11/18/2016  . Weakness generalized 11/16/2016  . Altered mental status, unspecified 11/16/2016  . Fracture, rib 11/16/2016  . Weakness 11/16/2016  . New onset of headaches after age 24 09/01/2015  . Morning headache 09/01/2015  . Daytime somnolence 09/01/2015  . Essential tremor 09/01/2015  . Intractable headache 09/01/2015  . Type 2 diabetes, controlled, with peripheral neuropathy ([place]) 06/09/2011    Past Surgical History:  Procedure Laterality Date  . BACK SURGERY    . KNEE SURGERY    . PORTACATH PLACEMENT Left 08/08/2019   Procedure: INSERTION PORT-A-CATH;  Surgeon: Aviva Signs, MD;  Location: [facility];  Service: General;  Laterality: Left;  . SHOULDER SURGERY          Home Medications    Prior to Admission medications   Medication Sig Start Date End Date Taking? Authorizing Provider  acetaminophen (TYLENOL) 325 MG tablet Take 2 tablets (650 mg total) by mouth every 6 (six) hours as needed for mild pain, fever or headache. 08/17/19   Roxan Hockey, MD  allopurinol (ZYLOPRIM) 300 MG tablet Take 1 tablet (300 mg total)  by mouth daily. 08/11/19   Derek Jack, MD  apixaban (ELIQUIS) 5 MG TABS tablet Take 2 tablets (10 mg total) by mouth 2 (two) times daily for 7 days. 08/31/19 09/07/19  Manuella Ghazi, Pratik D, DO  apixaban (ELIQUIS) 5 MG TABS tablet Take 1 tablet (5 mg total) by mouth 2 (two) times daily. 09/07/19 10/07/19  Manuella Ghazi, Pratik D, DO  calcitRIOL (ROCALTROL) 0.5 MCG capsule Take 1 capsule (0.5 mcg total) by mouth daily. 08/06/19   Gerlene Fee, NP  citalopram  (CELEXA) 20 MG tablet Take 1 tablet (20 mg total) by mouth at bedtime. 08/06/19   Gerlene Fee, NP  CYCLOPHOSPHAMIDE IV Inject into the vein every 21 ( twenty-one) days. 08/20/19   [provider]  DOXORUBICIN HCL IV Inject into the vein every 21 ( twenty-one) days. 08/20/19   [provider]  folic acid (FOLVITE) 1 MG tablet Take 1 tablet (1 mg total) by mouth daily. 08/06/19   Gerlene Fee, NP  gabapentin (NEURONTIN) 300 MG capsule Take 2 capsules (600 mg total) by mouth 3 (three) times daily. 08/06/19   Gerlene Fee, NP  GAVILAX 17 GM/SCOOP powder SMARTSIG:17 Gram(s) By Mouth Daily PRN 08/18/19   [provider]  INVOKANA 100 MG TABS tablet Take 100 mg by mouth daily. 09/03/19   [provider]  lidocaine-prilocaine (EMLA) cream Apply a small amount to port a cath site and cover with plastic wrap 1 hour prior to chemotherapy appointments 08/13/19   Derek Jack, MD  lisinopril (ZESTRIL) 20 MG tablet Take 20 mg by mouth daily.  08/18/19   [provider]  metFORMIN (GLUCOPHAGE) 500 MG tablet Take 1 tablet (500 mg total) by mouth daily. 08/06/19   Gerlene Fee, NP  NON FORMULARY Diet: Regular, NAS, Consistent Carbohydrate    [provider]  ondansetron (ZOFRAN) 4 MG tablet Take 1 tablet (4 mg total) by mouth every 6 (six) hours as needed for nausea. 08/17/19   Roxan Hockey, MD  pantoprazole (PROTONIX) 40 MG tablet Take 1 tablet (40 mg total) by mouth 2 (two) times daily before a meal. 08/06/19   Nyoka Cowden, Phylis Bougie, NP  polyethylene glycol (MIRALAX / GLYCOLAX) 17 g packet Take 17 g by mouth daily as needed for mild constipation. 08/17/19   Roxan Hockey, MD  predniSONE (DELTASONE) 20 MG tablet Take 3 tablets (60 mg total) by mouth daily. Take on days 1-5 of chemotherapy. Patient taking differently: Take 60 mg by mouth as directed. Take on days 1-5 of chemotherapy. 08/13/19   Derek Jack, MD  primidone (MYSOLINE) 50  MG tablet Take 1.5 tablets (75 mg total) by mouth every 8 (eight) hours. 08/06/19   Gerlene Fee, NP  prochlorperazine (COMPAZINE) 10 MG tablet Take 1 tablet (10 mg total) by mouth every 6 (six) hours as needed (Nausea or vomiting). 08/13/19   Derek Jack, MD  propranolol (INDERAL) 40 MG tablet Take 1 tablet (40 mg total) by mouth 2 (two) times daily. 08/06/19   Gerlene Fee, NP  riTUXimab in sodium chloride 0.9 % 250 mL Inject into the vein every 21 ( twenty-one) days. 08/20/19   [provider]  sucralfate (CARAFATE) 1 GM/10ML suspension Take 10 mLs (1 g total) by mouth 4 (four) times daily -  with meals and at bedtime. 08/06/19   Gerlene Fee, NP  temazepam (RESTORIL) 15 MG capsule Take 1 capsule (15 mg total) by mouth at bedtime as needed for sleep. 08/21/19   Delton Coombes,  Dirk Dress, MD  topiramate (TOPAMAX) 100 MG tablet Take 2 tablets (200 mg total) by mouth at bedtime. 08/06/19   Gerlene Fee, NP  vinCRIStine 2 mg in sodium chloride 0.9 % 50 mL Inject 2 mg into the vein every 21 ( twenty-one) days. 08/20/19   [provider]    Family History Family History  Problem Relation Age of Onset  . Cancer Sister   . Cancer Sister   . Cancer Brother   . Heart attack Father   . Cancer Brother   . Heart attack Brother   . Healthy Son   . Healthy Daughter   . Migraines Neg Hx     Social History Social History   Tobacco Use  . Smoking status: Never Smoker  . Smokeless tobacco: Never Used  Substance Use Topics  . Alcohol use: No  . Drug use: No     Allergies   Vancomycin   Review of Systems Review of Systems  Constitutional: Negative for appetite change and fatigue.  HENT: Negative for congestion, ear discharge and sinus pressure.   Eyes: Negative for discharge.  Respiratory: Negative for cough.   Cardiovascular: Negative for chest pain.  Gastrointestinal: Positive for abdominal pain. Negative for diarrhea.  Genitourinary: Positive for  dysuria. Negative for frequency and hematuria.  Musculoskeletal: Negative for back pain.  Skin: Negative for rash.  Neurological: Negative for seizures and headaches.  Psychiatric/Behavioral: Negative for hallucinations.     Physical Exam Updated Vital Signs BP (!) 127/58 (BP Location: Left Arm)   Pulse 75   Temp 99.1 F (37.3 C) (Oral)   Resp 19   Ht 5\' 11"  (1.803 m)   Wt 86.2 kg   SpO2 98%   BMI 26.50 kg/m   Physical Exam Vitals signs and nursing note reviewed.  Constitutional:      Appearance: He is well-developed.  HENT:     Head: Normocephalic.     Mouth/Throat:     Mouth: Mucous membranes are moist.  Eyes:     General: No scleral icterus.    Conjunctiva/sclera: Conjunctivae normal.  Neck:     Musculoskeletal: Neck supple.     Thyroid: No thyromegaly.  Cardiovascular:     Rate and Rhythm: Normal rate and regular rhythm.     Heart sounds: No murmur. No friction rub. No gallop.   Pulmonary:     Breath sounds: No stridor. No wheezing or rales.  Chest:     Chest wall: No tenderness.  Abdominal:     General: There is no distension.     Tenderness: There is no abdominal tenderness. There is no rebound.  Genitourinary:    Comments: Foley placed Musculoskeletal: Normal range of motion.  Lymphadenopathy:     Cervical: No cervical adenopathy.  Skin:    Findings: No erythema or rash.  Neurological:     Mental Status: He is oriented to person, place, and time.     Motor: No abnormal muscle tone.     Coordination: Coordination normal.  Psychiatric:        Behavior: Behavior normal.      ED Treatments / Results  Labs (all labs ordered are listed, but only abnormal results are displayed) Labs Reviewed  CBC WITH DIFFERENTIAL/PLATELET - Abnormal; Notable for the following components:      Result Value   RBC 2.82 (*)    Hemoglobin 9.3 (*)    HCT 28.2 (*)    RDW 17.5 (*)    All other components within  normal limits  BASIC METABOLIC PANEL  URINALYSIS,  ROUTINE W REFLEX MICROSCOPIC    EKG None  Radiology No results found.  Procedures Procedures (including critical care time)  Medications Ordered in ED Medications  sodium chloride 0.9 % bolus 1,000 mL (has no administration in time range)     Initial Impression / Assessment and Plan / ED Course  I have reviewed the triage vital signs and the nursing notes.  Pertinent labs & imaging results that were available during my care of the patient were reviewed by me and considered in my medical decision making (see chart for details).        Labs pending  Final Clinical Impressions(s) / ED Diagnoses   Final diagnoses:  None    ED Discharge Orders    None       Milton Ferguson, MD 09/15/19 [phone]

## 2019-09-15 NOTE — ED Notes (Addendum)
Date and time results received: 09/15/19 3:16 PM  Test: wbc Critical Value: 53.5  Name of Provider Notified: Dr Roderic Palau   Orders Received? Or Actions Taken?:

## 2019-09-15 NOTE — ED Provider Notes (Signed)
I discussed the case with Dr. Delton Coombes, the oncologist on-call.  He agrees that this patient's blood counts are appropriate for the medications that he was given after chemotherapy.  His symptoms have totally been relieved with a Foley catheter that has been placed, the urinalysis reveals many bacteria and white blood cells, a culture will be obtained.  The patient is otherwise very stable appearing, can go home.  He is asymptomatic otherwise.   Noemi Chapel, MD 09/15/19 [phone]

## 2019-09-15 NOTE — Discharge Instructions (Signed)
Your testing today shows that you have a urinary tract infection.  Please follow-up with your doctor for the results of the culture in 48 hours.  I have started Keflex, take this 1 tablet twice daily for the next 7 days.  If you have increasing abdominal pain vomiting or pain come back to the hospital immediately.

## 2019-09-16 ENCOUNTER — Telehealth (HOSPITAL_COMMUNITY): Payer: Self-pay | Admitting: *Deleted

## 2019-09-16 NOTE — Telephone Encounter (Signed)
Received call from patient's daughter, [name].  She reports that Daniel Richards has a fever of greater than 102 degrees and has a "rattle in his chest".  He was seen in the ED yesterday for bladder distention - his Foley catheter was changed out and his symptoms were relieved.  His labs were significant for UTI.  Per daughter, he had no fever yesterday, nor the rattle in his chest.  Advised her get Mr. Cabiness to the ED for further evaluation and septic work-up.  She verbalizes understanding.

## 2019-09-18 LAB — URINE CULTURE: Culture: >=100000 — AB

## 2019-09-19 NOTE — Telephone Encounter (Signed)
Post ED Visit - Positive Culture Follow-up  Culture report reviewed by antimicrobial stewardship pharmacist: [place] Team []  [name], Pharm.D. []  Heide Guile, Pharm.D., BCPS AQ-ID []  Parks Neptune, Pharm.D., BCPS []  Alycia Rossetti, Pharm.D., BCPS []  [place], Pharm.D., BCPS, AAHIVP []  Legrand Como, Pharm.D., BCPS, AAHIVP []  Salome Arnt, PharmD, BCPS []  Johnnette Gourd, PharmD, BCPS []  Hughes Better, PharmD, BCPS []  Leeroy Cha, PharmD []  Laqueta Linden, PharmD, BCPS []  Albertina Parr, PharmD  [place] Team []  Leodis Sias, PharmD []  Lindell Spar, PharmD []  Royetta Asal, PharmD []  Graylin Shiver, Rph []  Rema Fendt) Glennon Mac, PharmD []  Arlyn Dunning, PharmD []  Netta Cedars, PharmD []  Dia Sitter, PharmD []  Leone Haven, PharmD []  Gretta Arab, PharmD []  Theodis Shove, PharmD []  Peggyann Juba, PharmD []  Reuel Boom, PharmD   Positive urine culture Treated with cephalexin, organism sensitive to the same and no further patient follow-up is required at this time.  Hazle Nordmann 09/19/2019, 10:01 AM

## 2019-09-22 ENCOUNTER — Other Ambulatory Visit: Payer: Self-pay | Admitting: Adult Health

## 2019-09-27 ENCOUNTER — Encounter (HOSPITAL_COMMUNITY): Payer: Self-pay | Admitting: Emergency Medicine

## 2019-09-27 ENCOUNTER — Inpatient Hospital Stay (HOSPITAL_COMMUNITY)
Admission: EM | Admit: 2019-09-27 | Discharge: 2019-10-07 | DRG: 394 | Disposition: A | Discharge status: Skilled Nursing Facility | Payer: Medicare Other | Attending: Internal Medicine | Admitting: Internal Medicine

## 2019-09-27 ENCOUNTER — Other Ambulatory Visit: Payer: Self-pay

## 2019-09-27 ENCOUNTER — Emergency Department (HOSPITAL_COMMUNITY): Payer: Medicare Other

## 2019-09-27 DIAGNOSIS — Z881 Allergy status to other antibiotic agents status: Secondary | ICD-10-CM

## 2019-09-27 DIAGNOSIS — G43909 Migraine, unspecified, not intractable, without status migrainosus: Secondary | ICD-10-CM | POA: Diagnosis present

## 2019-09-27 DIAGNOSIS — M19011 Primary osteoarthritis, right shoulder: Secondary | ICD-10-CM | POA: Diagnosis present

## 2019-09-27 DIAGNOSIS — E785 Hyperlipidemia, unspecified: Secondary | ICD-10-CM | POA: Diagnosis present

## 2019-09-27 DIAGNOSIS — T83511A Infection and inflammatory reaction due to indwelling urethral catheter, initial encounter: Secondary | ICD-10-CM | POA: Diagnosis present

## 2019-09-27 DIAGNOSIS — B964 Proteus (mirabilis) (morganii) as the cause of diseases classified elsewhere: Secondary | ICD-10-CM | POA: Diagnosis present

## 2019-09-27 DIAGNOSIS — R1084 Generalized abdominal pain: Secondary | ICD-10-CM | POA: Diagnosis present

## 2019-09-27 DIAGNOSIS — C8333 Diffuse large B-cell lymphoma, intra-abdominal lymph nodes: Secondary | ICD-10-CM | POA: Diagnosis present

## 2019-09-27 DIAGNOSIS — C8510 Unspecified B-cell lymphoma, unspecified site: Secondary | ICD-10-CM | POA: Diagnosis present

## 2019-09-27 DIAGNOSIS — N390 Urinary tract infection, site not specified: Secondary | ICD-10-CM | POA: Diagnosis not present

## 2019-09-27 DIAGNOSIS — E1142 Type 2 diabetes mellitus with diabetic polyneuropathy: Secondary | ICD-10-CM | POA: Diagnosis present

## 2019-09-27 DIAGNOSIS — I2699 Other pulmonary embolism without acute cor pulmonale: Secondary | ICD-10-CM | POA: Diagnosis present

## 2019-09-27 DIAGNOSIS — Z7901 Long term (current) use of anticoagulants: Secondary | ICD-10-CM | POA: Diagnosis not present

## 2019-09-27 DIAGNOSIS — C8513 Unspecified B-cell lymphoma, intra-abdominal lymph nodes: Secondary | ICD-10-CM | POA: Diagnosis not present

## 2019-09-27 DIAGNOSIS — G250 Essential tremor: Secondary | ICD-10-CM | POA: Diagnosis present

## 2019-09-27 DIAGNOSIS — F329 Major depressive disorder, single episode, unspecified: Secondary | ICD-10-CM | POA: Diagnosis present

## 2019-09-27 DIAGNOSIS — Y846 Urinary catheterization as the cause of abnormal reaction of the patient, or of later complication, without mention of misadventure at the time of the procedure: Secondary | ICD-10-CM | POA: Diagnosis present

## 2019-09-27 DIAGNOSIS — K5900 Constipation, unspecified: Secondary | ICD-10-CM | POA: Diagnosis present

## 2019-09-27 DIAGNOSIS — Z809 Family history of malignant neoplasm, unspecified: Secondary | ICD-10-CM | POA: Diagnosis not present

## 2019-09-27 DIAGNOSIS — E876 Hypokalemia: Secondary | ICD-10-CM | POA: Diagnosis present

## 2019-09-27 DIAGNOSIS — Z20828 Contact with and (suspected) exposure to other viral communicable diseases: Secondary | ICD-10-CM | POA: Diagnosis present

## 2019-09-27 DIAGNOSIS — I10 Essential (primary) hypertension: Secondary | ICD-10-CM | POA: Diagnosis present

## 2019-09-27 DIAGNOSIS — N3000 Acute cystitis without hematuria: Secondary | ICD-10-CM | POA: Diagnosis present

## 2019-09-27 DIAGNOSIS — D649 Anemia, unspecified: Secondary | ICD-10-CM | POA: Diagnosis present

## 2019-09-27 DIAGNOSIS — T83511S Infection and inflammatory reaction due to indwelling urethral catheter, sequela: Secondary | ICD-10-CM | POA: Diagnosis not present

## 2019-09-27 DIAGNOSIS — Z8249 Family history of ischemic heart disease and other diseases of the circulatory system: Secondary | ICD-10-CM | POA: Diagnosis not present

## 2019-09-27 DIAGNOSIS — I700 Atherosclerosis of aorta: Secondary | ICD-10-CM | POA: Diagnosis present

## 2019-09-27 DIAGNOSIS — E1159 Type 2 diabetes mellitus with other circulatory complications: Secondary | ICD-10-CM | POA: Diagnosis present

## 2019-09-27 DIAGNOSIS — E559 Vitamin D deficiency, unspecified: Secondary | ICD-10-CM | POA: Diagnosis present

## 2019-09-27 DIAGNOSIS — Z86711 Personal history of pulmonary embolism: Secondary | ICD-10-CM | POA: Diagnosis not present

## 2019-09-27 DIAGNOSIS — Z7984 Long term (current) use of oral hypoglycemic drugs: Secondary | ICD-10-CM

## 2019-09-27 DIAGNOSIS — B952 Enterococcus as the cause of diseases classified elsewhere: Secondary | ICD-10-CM | POA: Diagnosis present

## 2019-09-27 DIAGNOSIS — K6389 Other specified diseases of intestine: Principal | ICD-10-CM | POA: Diagnosis present

## 2019-09-27 LAB — COMPREHENSIVE METABOLIC PANEL
ALT: 16 U/L (ref 0–44)
AST: 20 U/L (ref 15–41)
Albumin: 2.9 g/dL — ABNORMAL LOW (ref 3.5–5.0)
Alkaline Phosphatase: 106 U/L (ref 38–126)
Anion gap: 11 (ref 5–15)
BUN: 24 mg/dL — ABNORMAL HIGH (ref 8–23)
CO2: 30 mmol/L (ref 22–32)
Calcium: 10.7 mg/dL — ABNORMAL HIGH (ref 8.9–10.3)
Chloride: 98 mmol/L (ref 98–111)
Creatinine, Ser: 1.25 mg/dL — ABNORMAL HIGH (ref 0.61–1.24)
GFR calc Af Amer: >60 mL/min (ref >60)
GFR calc non Af Amer: 56 mL/min — ABNORMAL LOW (ref >60)
Glucose, Bld: 165 mg/dL — ABNORMAL HIGH (ref 70–99)
Potassium: 2.7 mmol/L — CL (ref 3.5–5.1)
Sodium: 139 mmol/L (ref 135–145)
Total Bilirubin: 0.3 mg/dL (ref 0.3–1.2)
Total Protein: 6.9 g/dL (ref 6.5–8.1)

## 2019-09-27 LAB — CBC WITH DIFFERENTIAL/PLATELET
Abs Immature Granulocytes: 0.17 10*3/uL — ABNORMAL HIGH (ref 0.00–0.07)
Basophils Absolute: 0.1 10*3/uL (ref 0.0–0.1)
Basophils Relative: 1 %
Eosinophils Absolute: 0 10*3/uL (ref 0.0–0.5)
Eosinophils Relative: 0 %
HCT: 27.5 % — ABNORMAL LOW (ref 39.0–52.0)
Hemoglobin: 8.7 g/dL — ABNORMAL LOW (ref 13.0–17.0)
Immature Granulocytes: 2 %
Lymphocytes Relative: 9 %
Lymphs Abs: 0.9 10*3/uL (ref 0.7–4.0)
MCH: 31.6 pg (ref 26.0–34.0)
MCHC: 31.6 g/dL (ref 30.0–36.0)
MCV: 100 fL (ref 80.0–100.0)
Monocytes Absolute: 0.7 10*3/uL (ref 0.1–1.0)
Monocytes Relative: 7 %
Neutro Abs: 7.6 10*3/uL (ref 1.7–7.7)
Neutrophils Relative %: 81 %
Platelets: 699 10*3/uL — ABNORMAL HIGH (ref 150–400)
RBC: 2.75 MIL/uL — ABNORMAL LOW (ref 4.22–5.81)
RDW: 17.1 % — ABNORMAL HIGH (ref 11.5–15.5)
WBC: 9.4 10*3/uL (ref 4.0–10.5)
nRBC: 0 % (ref 0.0–0.2)

## 2019-09-27 LAB — MAGNESIUM: Magnesium: 2.1 mg/dL (ref 1.7–2.4)

## 2019-09-27 LAB — URINALYSIS, ROUTINE W REFLEX MICROSCOPIC
Bilirubin Urine: NEGATIVE
Glucose, UA: NEGATIVE mg/dL
Ketones, ur: NEGATIVE mg/dL
Nitrite: NEGATIVE
Protein, ur: NEGATIVE mg/dL
Specific Gravity, Urine: 1.008 (ref 1.005–1.030)
WBC, UA: >50 WBC/hpf — ABNORMAL HIGH (ref 0–5)
pH: 8 (ref 5.0–8.0)

## 2019-09-27 LAB — LIPASE, BLOOD: Lipase: 20 U/L (ref 11–51)

## 2019-09-27 MED ORDER — POTASSIUM CHLORIDE CRYS ER 20 MEQ PO TBCR
40.0000 meq | EXTENDED_RELEASE_TABLET | ORAL | Status: AC
  Administered 2019-09-27 (×2): 40 meq via ORAL
  Filled 2019-09-27 (×2): qty 2

## 2019-09-27 MED ORDER — SUCRALFATE 1 G PO TABS
1.0000 g | ORAL_TABLET | Freq: Four times a day (QID) | ORAL | Status: DC
  Administered 2019-09-27 – 2019-10-07 (×39): 1 g via ORAL
  Filled 2019-09-27 (×38): qty 1

## 2019-09-27 MED ORDER — BISACODYL 10 MG RE SUPP
10.0000 mg | Freq: Once | RECTAL | Status: AC
  Administered 2019-09-27: 10 mg via RECTAL
  Filled 2019-09-27: qty 1

## 2019-09-27 MED ORDER — PROCHLORPERAZINE MALEATE 5 MG PO TABS: 10.0000 mg | ORAL_TABLET | Freq: Four times a day (QID) | ORAL | Status: DC | PRN

## 2019-09-27 MED ORDER — ONDANSETRON HCL 4 MG/2ML IJ SOLN: 4.0000 mg | Freq: Four times a day (QID) | INTRAMUSCULAR | Status: DC | PRN

## 2019-09-27 MED ORDER — SODIUM CHLORIDE 0.9% FLUSH: 3.0000 mL | INTRAVENOUS | Status: DC | PRN

## 2019-09-27 MED ORDER — SODIUM CHLORIDE 0.9 % IV BOLUS
500.0000 mL | Freq: Once | INTRAVENOUS | Status: AC
  Administered 2019-09-27: 500 mL via INTRAVENOUS

## 2019-09-27 MED ORDER — ALLOPURINOL 300 MG PO TABS
300.0000 mg | ORAL_TABLET | Freq: Every day | ORAL | Status: DC
  Administered 2019-09-28 – 2019-10-07 (×10): 300 mg via ORAL
  Filled 2019-09-27 (×10): qty 1

## 2019-09-27 MED ORDER — PANTOPRAZOLE SODIUM 40 MG PO TBEC
40.0000 mg | DELAYED_RELEASE_TABLET | Freq: Two times a day (BID) | ORAL | Status: DC
  Administered 2019-09-27 – 2019-10-07 (×19): 40 mg via ORAL
  Filled 2019-09-27 (×21): qty 1

## 2019-09-27 MED ORDER — CITALOPRAM HYDROBROMIDE 20 MG PO TABS
20.0000 mg | ORAL_TABLET | Freq: Every day | ORAL | Status: DC
  Administered 2019-09-27 – 2019-10-06 (×10): 20 mg via ORAL
  Filled 2019-09-27 (×10): qty 1

## 2019-09-27 MED ORDER — SODIUM CHLORIDE 0.9 % IV SOLN
INTRAVENOUS | Status: DC
  Administered 2019-09-27 – 2019-09-29 (×3): via INTRAVENOUS

## 2019-09-27 MED ORDER — POLYETHYLENE GLYCOL 3350 17 G PO PACK: 17.0000 g | PACK | Freq: Every day | ORAL | Status: DC | PRN

## 2019-09-27 MED ORDER — POTASSIUM CHLORIDE 10 MEQ/100ML IV SOLN
10.0000 meq | INTRAVENOUS | Status: AC
  Administered 2019-09-27 (×3): 10 meq via INTRAVENOUS
  Filled 2019-09-27 (×3): qty 100

## 2019-09-27 MED ORDER — ALBUTEROL SULFATE (2.5 MG/3ML) 0.083% IN NEBU: 2.5000 mg | INHALATION_SOLUTION | RESPIRATORY_TRACT | Status: DC | PRN

## 2019-09-27 MED ORDER — SODIUM CHLORIDE 0.9% FLUSH
3.0000 mL | Freq: Two times a day (BID) | INTRAVENOUS | Status: DC
  Administered 2019-09-27 – 2019-10-04 (×6): 3 mL via INTRAVENOUS

## 2019-09-27 MED ORDER — MORPHINE SULFATE (PF) 2 MG/ML IV SOLN
2.0000 mg | INTRAVENOUS | Status: AC | PRN
  Administered 2019-09-27 – 2019-09-30 (×14): 2 mg via INTRAVENOUS
  Filled 2019-09-27 (×15): qty 1

## 2019-09-27 MED ORDER — SODIUM CHLORIDE 0.9 % IV SOLN
INTRAVENOUS | Status: DC
  Administered 2019-09-27: 13:00:00 via INTRAVENOUS
  Administered 2019-09-27: 1000 mL via INTRAVENOUS
  Administered 2019-09-30: 06:00:00 via INTRAVENOUS

## 2019-09-27 MED ORDER — MORPHINE SULFATE (PF) 4 MG/ML IV SOLN
4.0000 mg | Freq: Once | INTRAVENOUS | Status: AC
  Administered 2019-09-27: 4 mg via INTRAVENOUS
  Filled 2019-09-27: qty 1

## 2019-09-27 MED ORDER — ACETAMINOPHEN 650 MG RE SUPP: 650.0000 mg | Freq: Four times a day (QID) | RECTAL | Status: DC | PRN

## 2019-09-27 MED ORDER — SODIUM CHLORIDE 0.9 % IV SOLN: 250.0000 mL | INTRAVENOUS | Status: DC | PRN

## 2019-09-27 MED ORDER — LEVOFLOXACIN IN D5W 750 MG/150ML IV SOLN
750.0000 mg | INTRAVENOUS | Status: DC
  Administered 2019-09-27 – 2019-09-28 (×2): 750 mg via INTRAVENOUS
  Filled 2019-09-27 (×2): qty 150

## 2019-09-27 MED ORDER — TRAZODONE HCL 50 MG PO TABS
50.0000 mg | ORAL_TABLET | Freq: Every evening | ORAL | Status: DC | PRN
  Administered 2019-09-28: 50 mg via ORAL
  Filled 2019-09-27 (×2): qty 1

## 2019-09-27 MED ORDER — APIXABAN 5 MG PO TABS
5.0000 mg | ORAL_TABLET | Freq: Two times a day (BID) | ORAL | Status: DC
  Administered 2019-09-27 – 2019-10-07 (×20): 5 mg via ORAL
  Filled 2019-09-27: qty 1
  Filled 2019-09-27: qty 2
  Filled 2019-09-27 (×2): qty 1
  Filled 2019-09-27: qty 2
  Filled 2019-09-27 (×16): qty 1

## 2019-09-27 MED ORDER — SODIUM CHLORIDE 0.9 % IV SOLN
2.0000 g | INTRAVENOUS | Status: DC
  Administered 2019-09-27: 2 g via INTRAVENOUS
  Filled 2019-09-27: qty 20

## 2019-09-27 MED ORDER — PRIMIDONE 50 MG PO TABS
75.0000 mg | ORAL_TABLET | Freq: Three times a day (TID) | ORAL | Status: DC
  Administered 2019-09-27 – 2019-10-07 (×30): 75 mg via ORAL
  Filled 2019-09-27 (×31): qty 2

## 2019-09-27 MED ORDER — POLYETHYLENE GLYCOL 3350 17 G PO PACK
17.0000 g | PACK | Freq: Two times a day (BID) | ORAL | Status: DC
  Administered 2019-09-27 – 2019-10-02 (×8): 17 g via ORAL
  Filled 2019-09-27 (×10): qty 1

## 2019-09-27 MED ORDER — SODIUM CHLORIDE 0.9 % IV SOLN: 1.0000 g | Freq: Once | INTRAVENOUS | Status: DC

## 2019-09-27 MED ORDER — LACTULOSE 10 GM/15ML PO SOLN
30.0000 g | Freq: Once | ORAL | Status: AC
  Administered 2019-09-27: 30 g via ORAL
  Filled 2019-09-27: qty 60

## 2019-09-27 MED ORDER — CALCITRIOL 0.5 MCG PO CAPS
0.5000 ug | ORAL_CAPSULE | Freq: Every day | ORAL | Status: DC
  Administered 2019-09-28 – 2019-10-07 (×10): 0.5 ug via ORAL
  Filled 2019-09-27 (×3): qty 1
  Filled 2019-09-27 (×5): qty 2
  Filled 2019-09-27 (×3): qty 1
  Filled 2019-09-27: qty 2
  Filled 2019-09-27: qty 1
  Filled 2019-09-27: qty 2
  Filled 2019-09-27: qty 1
  Filled 2019-09-27 (×3): qty 2
  Filled 2019-09-27 (×3): qty 1

## 2019-09-27 MED ORDER — TEMAZEPAM 15 MG PO CAPS
15.0000 mg | ORAL_CAPSULE | Freq: Every day | ORAL | Status: DC
  Administered 2019-09-27 – 2019-10-06 (×10): 15 mg via ORAL
  Filled 2019-09-27 (×10): qty 1

## 2019-09-27 MED ORDER — ONDANSETRON HCL 4 MG PO TABS
4.0000 mg | ORAL_TABLET | Freq: Four times a day (QID) | ORAL | Status: DC | PRN
  Administered 2019-10-05: 4 mg via ORAL
  Filled 2019-09-27: qty 1

## 2019-09-27 MED ORDER — SODIUM CHLORIDE 0.9 % IV SOLN: 1.0000 g | INTRAVENOUS | Status: DC

## 2019-09-27 MED ORDER — ACETAMINOPHEN 325 MG PO TABS: 650.0000 mg | ORAL_TABLET | Freq: Four times a day (QID) | ORAL | Status: DC | PRN

## 2019-09-27 MED ORDER — GABAPENTIN 300 MG PO CAPS
600.0000 mg | ORAL_CAPSULE | Freq: Three times a day (TID) | ORAL | Status: DC
  Administered 2019-09-27 – 2019-10-07 (×29): 600 mg via ORAL
  Filled 2019-09-27 (×30): qty 2

## 2019-09-27 MED ORDER — FOLIC ACID 1 MG PO TABS
1.0000 mg | ORAL_TABLET | Freq: Every day | ORAL | Status: DC
  Administered 2019-09-28 – 2019-10-07 (×10): 1 mg via ORAL
  Filled 2019-09-27 (×10): qty 1

## 2019-09-27 MED ORDER — OXYCODONE HCL 5 MG PO TABS
5.0000 mg | ORAL_TABLET | ORAL | Status: DC | PRN
  Administered 2019-09-27 – 2019-10-06 (×15): 5 mg via ORAL
  Filled 2019-09-27 (×15): qty 1

## 2019-09-27 MED ORDER — FOLIC ACID 1 MG PO TABS: 1.0000 mg | ORAL_TABLET | Freq: Every day | ORAL | Status: DC

## 2019-09-27 MED ORDER — FLUCONAZOLE IN SODIUM CHLORIDE 200-0.9 MG/100ML-% IV SOLN
200.0000 mg | Freq: Every day | INTRAVENOUS | Status: AC
  Administered 2019-09-27 – 2019-09-29 (×3): 200 mg via INTRAVENOUS
  Filled 2019-09-27 (×4): qty 100

## 2019-09-27 MED ORDER — IOHEXOL 300 MG/ML  SOLN
100.0000 mL | Freq: Once | INTRAMUSCULAR | Status: AC | PRN
  Administered 2019-09-27: 100 mL via INTRAVENOUS

## 2019-09-27 MED ORDER — TOPIRAMATE 100 MG PO TABS
200.0000 mg | ORAL_TABLET | Freq: Every day | ORAL | Status: DC
  Administered 2019-09-27 – 2019-10-05 (×9): 200 mg via ORAL
  Filled 2019-09-27 (×10): qty 2

## 2019-09-27 MED ORDER — ONDANSETRON HCL 4 MG/2ML IJ SOLN
4.0000 mg | Freq: Once | INTRAMUSCULAR | Status: AC
  Administered 2019-09-27: 09:00:00 4 mg via INTRAVENOUS
  Filled 2019-09-27: qty 2

## 2019-09-27 NOTE — ED Notes (Signed)
Patient c/o pain increasing again. 6/10, PRN order in place-medication given.

## 2019-09-27 NOTE — ED Triage Notes (Signed)
Pt brought in from home for LLQ pain with no bowel movement since Thursday. Denies n/v. Pt is getting chemo for "lymphoma".

## 2019-09-27 NOTE — Progress Notes (Signed)
Pharmacy Antibiotic Note  Daniel Richards is a 73 y.o. male admitted on 09/27/2019 with UTI/colitis  Pharmacy has been consulted for Levaqun dosing.  Plan: Levaquin 750 mg IV every 24 hours. Monitor labs, c/s, and renal function.  Height: 5\' 11"  (180.3 cm) Weight: 190 lb 0.6 oz (86.2 kg) IBW/kg (Calculated) : 75.3  Temp (24hrs), Avg:99.3 F (37.4 C), Min:99.3 F (37.4 C), Max:99.3 F (37.4 C)  Recent Labs  Lab 09/27/19 0853  WBC 9.4  CREATININE 1.25*    Estimated Creatinine Clearance: 54.4 mL/min (A) (by C-G formula based on SCr of 1.25 mg/dL (H)).    Allergies  Allergen Reactions  . Vancomycin Hives    Patient received Benadryl to treat the hives    Antimicrobials this admission: Levaqun 11/21 >>   Microbiology results: 11/21 UCx: pending   Thank you for allowing pharmacy to be a part of this patient's care.  Ramond Craver 09/27/2019 4:54 PM

## 2019-09-27 NOTE — ED Provider Notes (Addendum)
[facility] EMERGENCY DEPARTMENT Provider Note   CSN: [id number] Arrival date & time: 09/27/19  [id number]     History   Chief Complaint Chief Complaint  Patient presents with   Abdominal Pain    HPI Daniel Richards is a 73 y.o. male.      Abdominal Pain Patient presents to the ED with complaints of abdominal pain.  Patient states he started having the symptoms last evening.  He began having pain in the left side of his lower abdomen.  It is a constant pain that gets very sharp and intense with certain movements and palpation.  Pain does not radiate.  Denies any trouble with nausea or vomiting.  No known fevers.  No dysuria.  He has not had a normal bowel movement in several days.  Past Medical History:  Diagnosis Date   Anxiety and depression    Cancer ([place])    lymphoma   Diabetes mellitus 06/09/2011   pt taking metformin   DJD of shoulder    right    Hyperlipidemia    Hypertension    Migraines    Port-A-Cath in place 08/13/2019   Tremor    Vitamin D deficiency     Patient Active Problem List   Diagnosis Date Noted   Acute saddle pulmonary embolism with acute cor pulmonale ([place])    Urinary tract infection associated with indwelling urethral catheter (HCC)    Febrile neutropenia ([place]) 08/26/2019   PE (pulmonary thromboembolism) ([place]) 08/26/2019   Neutropenia, drug-induced ([place]) 08/20/2019   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   Port-A-Cath in place 08/13/2019   Anemia of chronic disease 08/07/2019   Major depression, recurrent, chronic ([place]) 08/07/2019   BPH without urinary obstruction 08/07/2019   Aortic atherosclerosis ([place]) 08/07/2019   Diabetic peripheral neuropathy ([place]) 08/07/2019   Hypertension associated with type 2 diabetes mellitus ([place]) 08/07/2019   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, unspecified B-cell lymphoma type  08/04/2019   Odynophagia    Acute kidney injury ([place])    Falls  frequently    Generalized abdominal pain    Goals of care, counseling/discussion    Palliative care by specialist    Folate deficiency    Generalized weakness 07/25/2019   Intra-abdominal lymphadenopathy 07/25/2019   Retroperitoneal lymphadenopathy 07/25/2019   Malnutrition of moderate degree 07/25/2019   Tremor    Hypercalcemia 07/24/2019   B12 deficiency 11/18/2016   Weakness generalized 11/16/2016   Altered mental status, unspecified 11/16/2016   Fracture, rib 11/16/2016   Weakness 11/16/2016   New onset of headaches after age 25 09/01/2015   Morning headache 09/01/2015   Daytime somnolence 09/01/2015   Essential tremor 09/01/2015   Intractable headache 09/01/2015   Type 2 diabetes, controlled, with peripheral neuropathy ([place]) 06/09/2011    Past Surgical History:  Procedure Laterality Date   BACK SURGERY     KNEE SURGERY     PORTACATH PLACEMENT Left 08/08/2019   Procedure: INSERTION PORT-A-CATH;  Surgeon: Aviva Signs, MD;  Location: [facility];  Service: General;  Laterality: Left;   SHOULDER SURGERY          Home Medications    Prior to Admission medications   Medication Sig Start Date End Date Taking? Authorizing Provider  acetaminophen (TYLENOL) 325 MG tablet Take 2 tablets (650 mg total) by mouth every 6 (six) hours as needed for mild pain, fever or headache. 08/17/19  Yes Emokpae, Courage, MD  allopurinol (ZYLOPRIM) 300 MG tablet Take 1 tablet (300 mg  total) by mouth daily. 09/15/19  Yes Derek Jack, MD  apixaban (ELIQUIS) 5 MG TABS tablet Take 1 tablet (5 mg total) by mouth 2 (two) times daily. 09/07/19 10/07/19 Yes Shah, Pratik D, DO  calcitRIOL (ROCALTROL) 0.5 MCG capsule Take 1 capsule (0.5 mcg total) by mouth daily. 08/06/19  Yes Gerlene Fee, NP  citalopram (CELEXA) 20 MG tablet Take 1 tablet (20 mg total) by mouth at bedtime. 08/06/19  Yes Gerlene Fee, NP  folic acid (FOLVITE) 1 MG tablet Take 1 tablet (1 mg total) by  mouth daily. 08/06/19  Yes Gerlene Fee, NP  gabapentin (NEURONTIN) 300 MG capsule Take 2 capsules (600 mg total) by mouth 3 (three) times daily. 08/06/19  Yes Gerlene Fee, NP  Sanjuan Dame 17 GM/SCOOP powder SMARTSIG:17 Gram(s) By Mouth Daily PRN 08/18/19  Yes [provider]  HYDROcodone-acetaminophen (NORCO) 10-325 MG tablet Take 1 tablet by mouth every 6 (six) hours as needed. 09/16/19  Yes [provider]  INVOKANA 100 MG TABS tablet Take 100 mg by mouth daily. 09/03/19  Yes [provider]  lidocaine-prilocaine (EMLA) cream Apply a small amount to port a cath site and cover with plastic wrap 1 hour prior to chemotherapy appointments 08/13/19  Yes Derek Jack, MD  lisinopril (ZESTRIL) 20 MG tablet Take 20 mg by mouth daily.  08/18/19  Yes [provider]  metFORMIN (GLUCOPHAGE) 500 MG tablet Take 1 tablet (500 mg total) by mouth daily. 08/06/19  Yes Gerlene Fee, NP  NON FORMULARY Diet: Regular, NAS, Consistent Carbohydrate   Yes [provider]  pantoprazole (PROTONIX) 40 MG tablet Take 1 tablet (40 mg total) by mouth 2 (two) times daily before a meal. 08/06/19  Yes Gerlene Fee, NP  polyethylene glycol (MIRALAX / GLYCOLAX) 17 g packet Take 17 g by mouth daily as needed for mild constipation. 08/17/19  Yes Emokpae, Courage, MD  primidone (MYSOLINE) 50 MG tablet Take 1.5 tablets (75 mg total) by mouth every 8 (eight) hours. Patient taking differently: Take 100 mg by mouth 2 (two) times daily.  08/06/19  Yes Gerlene Fee, NP  prochlorperazine (COMPAZINE) 10 MG tablet Take 1 tablet (10 mg total) by mouth every 6 (six) hours as needed (Nausea or vomiting). 08/13/19  Yes Derek Jack, MD  propranolol (INDERAL) 40 MG tablet Take 1 tablet (40 mg total) by mouth 2 (two) times daily. 08/06/19  Yes Gerlene Fee, NP  riTUXimab in sodium chloride 0.9 % 250 mL Inject into the vein every 21 ( twenty-one) days. 08/20/19  Yes [provider]  sucralfate (CARAFATE) 1 g tablet Take 1 g by mouth 4 (four) times daily. 09/22/19  Yes [provider]  temazepam (RESTORIL) 15 MG capsule TAKE ONE CAPSULE AT BED- TIME AS NEEDED FOR SLEEP. 09/15/19  Yes Derek Jack, MD  topiramate (TOPAMAX) 100 MG tablet Take 2 tablets (200 mg total) by mouth at bedtime. 08/06/19  Yes Gerlene Fee, NP  vinCRIStine 2 mg in sodium chloride 0.9 % 50 mL Inject 2 mg into the vein every 21 ( twenty-one) days. 08/20/19  Yes [provider]  apixaban (ELIQUIS) 5 MG TABS tablet Take 2 tablets (10 mg total) by mouth 2 (two) times daily for 7 days. 08/31/19 09/07/19  Manuella Ghazi, Pratik D, DO  CYCLOPHOSPHAMIDE IV Inject into the vein every 21 ( twenty-one) days. 08/20/19   [provider]  DOXORUBICIN HCL IV Inject into the vein every 21 ( twenty-one) days. 08/20/19  [provider]  ondansetron (ZOFRAN) 4 MG tablet Take 1 tablet (4 mg total) by mouth every 6 (six) hours as needed for nausea. Patient not taking: Reported on 09/27/2019 08/17/19   Roxan Hockey, MD  predniSONE (DELTASONE) 20 MG tablet Take 3 tablets (60 mg total) by mouth daily. Take on days 1-5 of chemotherapy. Patient not taking: Reported on 09/27/2019 08/13/19   Derek Jack, MD    Family History Family History  Problem Relation Age of Onset   Cancer Sister    Cancer Sister    Cancer Brother    Heart attack Father    Cancer Brother    Heart attack Brother    Healthy Son    Healthy Daughter    Migraines Neg Hx     Social History Social History   Tobacco Use   Smoking status: Never Smoker   Smokeless tobacco: Never Used  Substance Use Topics   Alcohol use: No   Drug use: No     Allergies   Vancomycin   Review of Systems Review of Systems  Gastrointestinal: Positive for abdominal pain.  All other systems reviewed and are negative.    Physical Exam Updated Vital Signs BP 119/63    Pulse 87    Temp  99.3 F (37.4 C) (Oral)    Resp 12    Ht 1.803 m (5\' 11" )    Wt 86.2 kg    SpO2 98%    BMI 26.50 kg/m   Physical Exam Vitals signs and nursing note reviewed.  Constitutional:      General: He is not in acute distress.    Comments: Elderly  HENT:     Head: Normocephalic and atraumatic.     Right Ear: External ear normal.     Left Ear: External ear normal.  Eyes:     General: No scleral icterus.       Right eye: No discharge.        Left eye: No discharge.     Conjunctiva/sclera: Conjunctivae normal.  Neck:     Musculoskeletal: Neck supple.     Trachea: No tracheal deviation.  Cardiovascular:     Rate and Rhythm: Regular rhythm. Tachycardia present.  Pulmonary:     Effort: Pulmonary effort is normal. No respiratory distress.     Breath sounds: Normal breath sounds. No stridor. No wheezing or rales.  Abdominal:     General: Bowel sounds are normal. There is no distension.     Palpations: Abdomen is soft.     Tenderness: There is abdominal tenderness in the left lower quadrant. There is guarding. There is no rebound.     Hernia: No hernia is present.  Genitourinary:    Comments: No fecal impaction Musculoskeletal:        General: No tenderness.     Right lower leg: No edema.     Left lower leg: No edema.  Skin:    General: Skin is warm and dry.     Findings: No rash.  Neurological:     Mental Status: He is alert.     Cranial Nerves: No cranial nerve deficit (no facial droop, extraocular movements intact, no slurred speech).     Sensory: No sensory deficit.     Motor: Tremor present. No abnormal muscle tone or seizure activity.     Coordination: Coordination normal.      ED Treatments / Results  Labs (all labs ordered are listed, but only abnormal results are displayed) Labs Reviewed  COMPREHENSIVE METABOLIC PANEL - Abnormal; Notable for the following components:      Result Value   Potassium 2.7 (*)    Glucose, Bld 165 (*)    BUN 24 (*)    Creatinine, Ser 1.25  (*)    Calcium 10.7 (*)    Albumin 2.9 (*)    GFR calc non Af Amer 56 (*)    All other components within normal limits  CBC WITH DIFFERENTIAL/PLATELET - Abnormal; Notable for the following components:   RBC 2.75 (*)    Hemoglobin 8.7 (*)    HCT 27.5 (*)    RDW 17.1 (*)    Platelets 699 (*)    Abs Immature Granulocytes 0.17 (*)    All other components within normal limits  URINALYSIS, ROUTINE W REFLEX MICROSCOPIC - Abnormal; Notable for the following components:   APPearance HAZY (*)    Hgb urine dipstick MODERATE (*)    Leukocytes,Ua MODERATE (*)    WBC, UA >50 (*)    Bacteria, UA RARE (*)    All other components within normal limits  SARS CORONAVIRUS 2 (TAT 6-24 HRS)  LIPASE, BLOOD    EKG EKG Interpretation  Date/Time:  Saturday September 27 2019 08:44:28 EST Ventricular Rate:  102 PR Interval:    QRS Duration: 107 QT Interval:  392 QTC Calculation: 511 R Axis:   51 Text Interpretation: Sinus tachycardia Multiple premature complexes, vent & supraven , new since lat tracing Repol abnrm suggests ischemia, diffuse leads Prolonged QT interval Since last tracing rate faster Confirmed by Dorie Rank [phone]) on 09/27/2019 8:58:45 AM   Radiology Ct Abdomen Pelvis W Contrast  Result Date: 09/27/2019 CLINICAL DATA:  Left lower quadrant abdominal pain for several days. Lymphoma on chemotherapy. EXAM: CT ABDOMEN AND PELVIS WITH CONTRAST TECHNIQUE: Multidetector CT imaging of the abdomen and pelvis was performed using the standard protocol following bolus administration of intravenous contrast. CONTRAST:  153mL OMNIPAQUE IOHEXOL 300 MG/ML  SOLN COMPARISON:  08/07/2019 PET-CT.  07/24/2019 CT abdomen/pelvis. FINDINGS: Lower chest: No significant pulmonary nodules or acute consolidative airspace disease. Coronary atherosclerosis. Hepatobiliary: Normal liver size. No liver mass. Mild layering sludge in the gallbladder. No gallbladder wall thickening. No radiopaque cholelithiasis. No  pericholecystic fluid. No biliary ductal dilatation. Pancreas: Normal, with no mass or duct dilation. Spleen: Normal size. No mass. Adrenals/Urinary Tract: Normal adrenals. Nonobstructing 2 mm upper and interpolar left renal stones. No hydronephrosis. Simple exophytic 1.7 cm posterior interpolar left renal cyst. Stable subcentimeter hypodense renal cortical lesions in right kidney, too small to characterize. Bladder collapsed by indwelling Foley catheter. Scattered minimal nondependent bladder gas is expected given instrumentation. Diffuse bladder wall thickening. Multiple layering bladder stones, largest 7 mm, similar. Stomach/Bowel: Normal non-distended stomach. Normal caliber small bowel with no small bowel wall thickening. Normal appendix. Large right colonic stool volume. New mild circumferential rectal wall thickening. New focal pericolonic fat stranding in the proximal sigmoid colon with central fat density suggestive acute epiploic appendagitis (series 2/image 59). New mild circumferential wall thickening in the rectum with increased presacral space edema. Vascular/Lymphatic: Atherosclerotic nonaneurysmal abdominal aorta. Patent portal, splenic, hepatic and renal veins. Previously visualized extensive retroperitoneal and left pelvic adenopathy has substantially decreased. Representative short axis diameter 2.1 cm left para-aortic node (series 2/image 39), previously 4.3 cm on 07/24/2019 CT using similar measurement technique. Representative 1.0 cm left common iliac node (series 2/image 64), previously 2.9 cm. Representative 1.4 cm left external iliac node (series 2/image 78), previously 3.6 cm. No new abdominopelvic adenopathy. There  is increased fat stranding throughout sites of adenopathy in the retroperitoneum and left pelvis. Reproductive: Moderately enlarged prostate. Other: No pneumoperitoneum, ascites or focal fluid collection. Musculoskeletal: No aggressive appearing focal osseous lesions. Mild  thoracolumbar spondylosis. IMPRESSION: 1. Evidence of significant partial treatment response with substantially decreased retroperitoneal and left pelvic adenopathy. 2. Findings suggestive of acute epiploic appendagitis in the proximal sigmoid colon in the left lower quadrant of the abdomen. 3. Nonspecific new mild circumferential rectal wall thickening, with increased presacral edema. Uncertain if these represent treatment effects or nonspecific proctitis. 4. Large right colonic stool volume suggests constipation. No bowel obstruction. No free air. 5. Diffuse bladder wall thickening, nonspecific, favor chronic given moderate prostatomegaly. Suggest correlation with urinalysis. Bladder decompressed by Foley catheter. Several layering bladder stones. 6. Nonobstructing left nephrolithiasis. 7.  Aortic Atherosclerosis (ICD10-I70.0). Electronically Signed   By: Ilona Sorrel M.D.   On: 09/27/2019 13:23    Procedures .Critical Care Performed by: Dorie Rank, MD Authorized by: Dorie Rank, MD   Critical care provider statement:    Critical care time (minutes):  30   Critical care was time spent personally by me on the following activities:  Discussions with consultants, evaluation of patient's response to treatment, examination of patient, ordering and performing treatments and interventions, ordering and review of laboratory studies, ordering and review of radiographic studies, pulse oximetry, re-evaluation of patient's condition, obtaining history from patient or surrogate and review of old charts   (including critical care time)  Medications Ordered in ED Medications  sodium chloride 0.9 % bolus 500 mL (0 mLs Intravenous Stopped 09/27/19 1029)    And  0.9 %  sodium chloride infusion ( Intravenous New Bag/Given 09/27/19 1329)  potassium chloride 10 mEq in 100 mL IVPB (10 mEq Intravenous New Bag/Given 09/27/19 1328)  morphine 4 MG/ML injection 4 mg (4 mg Intravenous Given 09/27/19 0928)  ondansetron  (ZOFRAN) injection 4 mg (4 mg Intravenous Given 09/27/19 0928)  iohexol (OMNIPAQUE) 300 MG/ML solution 100 mL (100 mLs Intravenous Contrast Given 09/27/19 1237)  morphine 4 MG/ML injection 4 mg (4 mg Intravenous Given 09/27/19 1126)     Initial Impression / Assessment and Plan / ED Course  I have reviewed the triage vital signs and the nursing notes.  Pertinent labs & imaging results that were available during my care of the patient were reviewed by me and considered in my medical decision making (see chart for details).  Clinical Course as of Sep 26 1345  Sat Sep 27, 2019  0936 Hgb stable.     [JK]  [id number] Hypokalemia noted.  Potassium ordered   [JK]  1330 Urinalysis shows pyuria and mod leuk   [JK]  1343 Repeat exam, still with significant ttp.   [JK]    Clinical Course User Index [JK] Dorie Rank, MD     Patient presented to the emergency room for evaluation of acute lower abdominal pain.  Patient's ED work-up demonstrates acute epiploic appendagitis.  There are also findings suggestive of possible proctitis as well as cystitis.  Constipation is also noted although they do not think this is related to the patient's pain.  Patient was given a dose of IV antibiotics for the probable urinary tract infection.  Patient does have an indwelling catheter but I think the findings of bladder thickening correlate with cystitis.  IV potassium was ordered for his hypokalemia.  Patient continues to have significant abdominal tenderness on exam.  I think he would benefit for admission, pain management and correction of  his potassium.  Final Clinical Impressions(s) / ED Diagnoses   Final diagnoses:  Epiploic appendagitis  Hypokalemia  Acute cystitis without hematuria  Anemia, unspecified type      Dorie Rank, MD 09/27/19 1349 Cc addendum   Dorie Rank, MD 10/06/19 [phone]

## 2019-09-27 NOTE — H&P (Addendum)
Patient Demographics:    Daniel Richards, is a 73 y.o. 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, unspecified B-cell lymphoma type    Hypertension associated with type 2 diabetes mellitus ([place])   PE (pulmonary thromboembolism) ([place])   Generalized abdominal pain   Brief Summary:- 73 y.o.malewith a history of diabetes, hypertension, recent diagnosis of B-cell lymphoma with chemotherapy admitted on 09/27/2019 with significant left-sided abdominal pain consistent with epiploic appendagitis   A/P 1) epiploic appendagitis---  ischemic infarction of an epiploic appendage caused by torsion or spontaneous thrombosis of the epiploic appendage central draining vein----patient with significant abdominal pain at this time requiring IV pain medications  -Admit for pain control, conservative management with pain control as ordered -Anticipate resolution over the next few days -No indication for antibiotics, surgical intervention not indicated unless fails to resolve with conservative management  2)B-cell lymphoma---currently receiving chemotherapy from Dr. Delton Richards,  -CT abdomen and pelvis from 09/27/2019 reveals significant partial treatment response to current chemotherapy with substantially decreased retroperitoneal and left pelvic adenopathy  3)Possible UTI in the setting of BPH with LUTs---recurrent UTIs, chronic indwelling Foley  catheter which was last changed on 09/15/2019 -Continue Flomax -Urine culture from 08/26/2019 with  pansensitive Proteus mirabilis and Enterococcus faecalis  -Treat empirically with Levaquin pending repeat urine culture  4)DM-excellent control, A1c 5.6,  -Patient had a contrast study so stop Metformin  -Patient has a UTI so STOP Invokana -Use Novolog/Humalog Sliding scale insulin with Accu-Cheks/Fingersticks as ordered   5)Depressive disorder--stable, continue Celexa, as well as Restoril as needed for sleep  6)HTN-okay to continue propranolol, but stop amlodipine -Hold lisinopril  7)Generalized weakness/Debility--will get physical therapy evaluation   8)Constipation--- significant constipation especially on the right side of the colon-  - c/n  laxatives as ordered  9))H/o PE-- Dxed in 08/2019--- c/n Eliquis indefinitely as patient has underlying lymphoma/malignancy  10) essential tremors--- continue propanolol  Disposition-Home  Code Status:Full  Family Communication: (patient is alert, awake and coherent) -Discussed with patient's son at bedside, questions answered Called and Richards/w Pt's daughter (POA) Daniel Richards  Follow UP-- Dr Daniel Richards   With History of - Reviewed by me  Past Medical History:  Diagnosis Date   Anxiety and depression    Cancer ([place])    lymphoma   Diabetes mellitus 06/09/2011   pt taking metformin   DJD of shoulder    right    Hyperlipidemia    Hypertension    Migraines    Port-A-Cath in place 08/13/2019   Tremor    Vitamin Richards deficiency  Past Surgical History:  Procedure Laterality Date   BACK SURGERY     KNEE SURGERY     PORTACATH PLACEMENT Left 08/08/2019   Procedure: INSERTION PORT-A-CATH;  Surgeon: Daniel Signs, MD;  Location: [facility];  Service: General;  Laterality: Left;   SHOULDER SURGERY        Chief Complaint  Patient presents with   Abdominal Pain      HPI:    Daniel Richards  is a 73 y.o.  male history of diabetes, hypertension, recent diagnosis of B-cell lymphoma with chemotherapy and persistent urinary retention with recurrent UTI in the setting of chronic indwelling catheter who presents on 09/27/2019 with significant left-sided abdominal pain consistent with epiploic appendagitis  -Urine culture from 08/26/2019 with  pansensitive Proteus mirabilis and Enterococcus faecalis   -- -Patient has not had BM in over 48 hours, left lower quadrant abdominal pain is worsened since last evening -No fevers or chills No Nausea, Vomiting or Diarrhea -Has chronic indwelling Foley catheter which was last changed on 09/15/2019  In ED CT abdomen and pelvis consistent with constipation and epiploic appendagitis of the proximal sigmoid - Not elevated, potassium is down to 2.7, creatinine 1.25 -WBC is 9.4 with H&H of 8.7 and 21.5 and platelets of 699 -UA suggestive of UTI, urine culture pending -COVID-19 test pending  -Additional history obtained from patient's son Daniel Richards at bedside -EDP gave Rocephin for possible UTI   Review of systems:    In addition to the HPI above,   A full Review of  Systems was done, all other systems reviewed are negative except as noted above in HPI , .    Social History:  Reviewed by me    Social History   Tobacco Use   Smoking status: Never Smoker   Smokeless tobacco: Never Used  Substance Use Topics   Alcohol use: No     Family History :  Reviewed by me   Family History  Problem Relation Age of Onset   Cancer Sister    Cancer Sister    Cancer Brother    Heart attack Father    Cancer Brother    Heart attack Brother    Healthy Son    Healthy Daughter    Migraines Neg Hx     Home Medications:   Prior to Admission medications   Medication Sig Start Date End Date Taking? Authorizing Provider  acetaminophen (TYLENOL) 325 MG tablet Take 2 tablets (650 mg total) by mouth every 6 (six) hours as needed for mild pain, fever or  headache. 08/17/19  Yes Daniel Polyakov, MD  allopurinol (ZYLOPRIM) 300 MG tablet Take 1 tablet (300 mg total) by mouth daily. 09/15/19  Yes Daniel Jack, MD  apixaban (ELIQUIS) 5 MG TABS tablet Take 1 tablet (5 mg total) by mouth 2 (two) times daily. 09/07/19 10/07/19 Yes Richards, Daniel D, DO  calcitRIOL (ROCALTROL) 0.5 MCG capsule Take 1 capsule (0.5 mcg total) by mouth daily. 08/06/19  Yes Daniel Fee, NP  citalopram (CELEXA) 20 MG tablet Take 1 tablet (20 mg total) by mouth at bedtime. 08/06/19  Yes Daniel Fee, NP  folic acid (FOLVITE) 1 MG tablet Take 1 tablet (1 mg total) by mouth daily. 08/06/19  Yes Daniel Fee, NP  gabapentin (NEURONTIN) 300 MG capsule Take 2 capsules (600 mg total) by mouth 3 (three) times daily. 08/06/19  Yes Daniel Fee, NP  GAVILAX 17 GM/SCOOP powder SMARTSIG:17 Gram(s) By Mouth Daily PRN 08/18/19  Yes [provider]  HYDROcodone-acetaminophen (NORCO) 10-325 MG tablet Take 1 tablet by mouth every 6 (six) hours as needed. 09/16/19  Yes [provider]  INVOKANA 100 MG TABS tablet Take 100 mg by mouth daily. 09/03/19  Yes [provider]  lidocaine-prilocaine (EMLA) cream Apply a small amount to port a cath site and cover with plastic wrap 1 hour prior to chemotherapy appointments 08/13/19  Yes Daniel Jack, MD  lisinopril (ZESTRIL) 20 MG tablet Take 20 mg by mouth daily.  08/18/19  Yes [provider]  metFORMIN (GLUCOPHAGE) 500 MG tablet Take 1 tablet (500 mg total) by mouth daily. 08/06/19  Yes Daniel Fee, NP  NON FORMULARY Diet: Regular, NAS, Consistent Carbohydrate   Yes [provider]  pantoprazole (PROTONIX) 40 MG tablet Take 1 tablet (40 mg total) by mouth 2 (two) times daily before a meal. 08/06/19  Yes Daniel Fee, NP  polyethylene glycol (MIRALAX / GLYCOLAX) 17 g packet Take 17 g by mouth daily as needed for mild constipation. 08/17/19  Yes Monya Kozakiewicz, MD  primidone  (MYSOLINE) 50 MG tablet Take 1.5 tablets (75 mg total) by mouth every 8 (eight) hours. Patient taking differently: Take 100 mg by mouth 2 (two) times daily.  08/06/19  Yes Daniel Fee, NP  prochlorperazine (COMPAZINE) 10 MG tablet Take 1 tablet (10 mg total) by mouth every 6 (six) hours as needed (Nausea or vomiting). 08/13/19  Yes Daniel Jack, MD  propranolol (INDERAL) 40 MG tablet Take 1 tablet (40 mg total) by mouth 2 (two) times daily. 08/06/19  Yes Daniel Fee, NP  riTUXimab in sodium chloride 0.9 % 250 mL Inject into the vein every 21 ( twenty-one) days. 08/20/19  Yes [provider]  sucralfate (CARAFATE) 1 g tablet Take 1 g by mouth 4 (four) times daily. 09/22/19  Yes [provider]  temazepam (RESTORIL) 15 MG capsule TAKE ONE CAPSULE AT BED- TIME AS NEEDED FOR SLEEP. 09/15/19  Yes Daniel Jack, MD  topiramate (TOPAMAX) 100 MG tablet Take 2 tablets (200 mg total) by mouth at bedtime. 08/06/19  Yes Daniel Fee, NP  vinCRIStine 2 mg in sodium chloride 0.9 % 50 mL Inject 2 mg into the vein every 21 ( twenty-one) days. 08/20/19  Yes [provider]  apixaban (ELIQUIS) 5 MG TABS tablet Take 2 tablets (10 mg total) by mouth 2 (two) times daily for 7 days. 08/31/19 09/07/19  Manuella Ghazi, Daniel D, DO  CYCLOPHOSPHAMIDE IV Inject into the vein every 21 ( twenty-one) days. 08/20/19   [provider]  DOXORUBICIN HCL IV Inject into the vein every 21 ( twenty-one) days. 08/20/19   [provider]  ondansetron (ZOFRAN) 4 MG tablet Take 1 tablet (4 mg total) by mouth every 6 (six) hours as needed for nausea. Patient not taking: Reported on 09/27/2019 08/17/19   Roxan Hockey, MD  predniSONE (DELTASONE) 20 MG tablet Take 3 tablets (60 mg total) by mouth daily. Take on days 1-5 of chemotherapy. Patient not taking: Reported on 09/27/2019 08/13/19   Daniel Jack, MD     Allergies:     Allergies  Allergen Reactions   Vancomycin  Hives    Patient received Benadryl to treat the hives     Physical Exam:   Vitals  Blood pressure (!) 141/69, pulse 92, temperature 99.3 F (37.4 C), temperature source Oral, resp. rate 13, height 5\' 11"  (1.803 m), weight 86.2 kg, SpO2 92 %.  Physical Examination: General appearance - alert, uncomfortable with  pain  mental status - alert, oriented to person, place, and time, Eyes - sclera anicteric Neck - supple, no JVD elevation , Chest - clear  to auscultation bilaterally, symmetrical air movement,  Heart - S1 and S2 normal, regular, left subclavian area Port-A-Cath in situ clean Abdomen - soft,  nondistended, lower abdominal tenderness especially in the left lower quadrant with some voluntary guarding  neurological - screening mental status exam normal, neck supple without rigidity, cranial nerves II through XII intact, DTR's normal and symmetric Extremities - no pedal edema noted, intact peripheral pulses  Skin - warm, dry GU-Foley catheter with cloudy urine     Data Review:    CBC Recent Labs  Lab 09/27/19 0853  WBC 9.4  HGB 8.7*  HCT 27.5*  PLT 699*  MCV 100.0  MCH 31.6  MCHC 31.6  RDW 17.1*  LYMPHSABS 0.9  MONOABS 0.7  EOSABS 0.0  BASOSABS 0.1   ------------------------------------------------------------------------------------------------------------------  Chemistries  Recent Labs  Lab 09/27/19 0853  NA 139  K 2.7*  CL 98  CO2 30  GLUCOSE 165*  BUN 24*  CREATININE 1.25*  CALCIUM 10.7*  MG 2.1  AST 20  ALT 16  ALKPHOS 106  BILITOT 0.3   ------------------------------------------------------------------------------------------------------------------ estimated creatinine clearance is 54.4 mL/min (A) (by C-G formula based on SCr of 1.25 mg/dL (H)). ------------------------------------------------------------------------------------------------------------------ No results for input(s): TSH, T4TOTAL, T3FREE, THYROIDAB in the last 72  hours.  Invalid input(s): FREET3   Coagulation profile No results for input(s): INR, PROTIME in the last 168 hours. ------------------------------------------------------------------------------------------------------------------- No results for input(s): DDIMER in the last 72 hours. -------------------------------------------------------------------------------------------------------------------  Cardiac Enzymes No results for input(s): CKMB, TROPONINI, MYOGLOBIN in the last 168 hours.  Invalid input(s): CK ------------------------------------------------------------------------------------------------------------------ No results found for: BNP   ---------------------------------------------------------------------------------------------------------------  Urinalysis    Component Value Date/Time   COLORURINE YELLOW 09/27/2019 0853   APPEARANCEUR HAZY (A) 09/27/2019 0853   LABSPEC 1.008 09/27/2019 0853   PHURINE 8.0 09/27/2019 0853   GLUCOSEU NEGATIVE 09/27/2019 0853   HGBUR MODERATE (A) 09/27/2019 0853   BILIRUBINUR NEGATIVE 09/27/2019 0853   KETONESUR NEGATIVE 09/27/2019 0853   PROTEINUR NEGATIVE 09/27/2019 0853   UROBILINOGEN 1.0 11/18/2014 1238   NITRITE NEGATIVE 09/27/2019 0853   LEUKOCYTESUR MODERATE (A) 09/27/2019 0853    ----------------------------------------------------------------------------------------------------------------   Imaging Results:    Ct Abdomen Pelvis W Contrast  Result Date: 09/27/2019 CLINICAL DATA:  Left lower quadrant abdominal pain for several days. Lymphoma on chemotherapy. EXAM: CT ABDOMEN AND PELVIS WITH CONTRAST TECHNIQUE: Multidetector CT imaging of the abdomen and pelvis was performed using the standard protocol following bolus administration of intravenous contrast. CONTRAST:  173mL OMNIPAQUE IOHEXOL 300 MG/ML  SOLN COMPARISON:  08/07/2019 PET-CT.  07/24/2019 CT abdomen/pelvis. FINDINGS: Lower chest: No significant pulmonary  nodules or acute consolidative airspace disease. Coronary atherosclerosis. Hepatobiliary: Normal liver size. No liver mass. Mild layering sludge in the gallbladder. No gallbladder wall thickening. No radiopaque cholelithiasis. No pericholecystic fluid. No biliary ductal dilatation. Pancreas: Normal, with no mass or duct dilation. Spleen: Normal size. No mass. Adrenals/Urinary Tract: Normal adrenals. Nonobstructing 2 mm upper and interpolar left renal stones. No hydronephrosis. Simple exophytic 1.7 cm posterior interpolar left renal cyst. Stable subcentimeter hypodense renal cortical lesions in right kidney, too small to characterize. Bladder collapsed by indwelling Foley catheter. Scattered minimal nondependent bladder gas is expected given instrumentation. Diffuse bladder wall thickening. Multiple layering bladder stones, largest 7 mm, similar. Stomach/Bowel: Normal non-distended stomach. Normal caliber small bowel with no small bowel wall thickening. Normal  appendix. Large right colonic stool volume. New mild circumferential rectal wall thickening. New focal pericolonic fat stranding in the proximal sigmoid colon with central fat density suggestive acute epiploic appendagitis (series 2/image 59). New mild circumferential wall thickening in the rectum with increased presacral space edema. Vascular/Lymphatic: Atherosclerotic nonaneurysmal abdominal aorta. Patent portal, splenic, hepatic and renal veins. Previously visualized extensive retroperitoneal and left pelvic adenopathy has substantially decreased. Representative short axis diameter 2.1 cm left para-aortic node (series 2/image 39), previously 4.3 cm on 07/24/2019 CT using similar measurement technique. Representative 1.0 cm left common iliac node (series 2/image 64), previously 2.9 cm. Representative 1.4 cm left external iliac node (series 2/image 78), previously 3.6 cm. No new abdominopelvic adenopathy. There is increased fat stranding throughout sites of  adenopathy in the retroperitoneum and left pelvis. Reproductive: Moderately enlarged prostate. Other: No pneumoperitoneum, ascites or focal fluid collection. Musculoskeletal: No aggressive appearing focal osseous lesions. Mild thoracolumbar spondylosis. IMPRESSION: 1. Evidence of significant partial treatment response with substantially decreased retroperitoneal and left pelvic adenopathy. 2. Findings suggestive of acute epiploic appendagitis in the proximal sigmoid colon in the left lower quadrant of the abdomen. 3. Nonspecific new mild circumferential rectal wall thickening, with increased presacral edema. Uncertain if these represent treatment effects or nonspecific proctitis. 4. Large right colonic stool volume suggests constipation. No bowel obstruction. No free air. 5. Diffuse bladder wall thickening, nonspecific, favor chronic given moderate prostatomegaly. Suggest correlation with urinalysis. Bladder decompressed by Foley catheter. Several layering bladder stones. 6. Nonobstructing left nephrolithiasis. 7.  Aortic Atherosclerosis (ICD10-I70.0). Electronically Signed   By: Ilona Sorrel M.Richards.   On: 09/27/2019 13:23    Radiological Exams on Admission: Ct Abdomen Pelvis W Contrast  Result Date: 09/27/2019 CLINICAL DATA:  Left lower quadrant abdominal pain for several days. Lymphoma on chemotherapy. EXAM: CT ABDOMEN AND PELVIS WITH CONTRAST TECHNIQUE: Multidetector CT imaging of the abdomen and pelvis was performed using the standard protocol following bolus administration of intravenous contrast. CONTRAST:  167mL OMNIPAQUE IOHEXOL 300 MG/ML  SOLN COMPARISON:  08/07/2019 PET-CT.  07/24/2019 CT abdomen/pelvis. FINDINGS: Lower chest: No significant pulmonary nodules or acute consolidative airspace disease. Coronary atherosclerosis. Hepatobiliary: Normal liver size. No liver mass. Mild layering sludge in the gallbladder. No gallbladder wall thickening. No radiopaque cholelithiasis. No pericholecystic fluid. No  biliary ductal dilatation. Pancreas: Normal, with no mass or duct dilation. Spleen: Normal size. No mass. Adrenals/Urinary Tract: Normal adrenals. Nonobstructing 2 mm upper and interpolar left renal stones. No hydronephrosis. Simple exophytic 1.7 cm posterior interpolar left renal cyst. Stable subcentimeter hypodense renal cortical lesions in right kidney, too small to characterize. Bladder collapsed by indwelling Foley catheter. Scattered minimal nondependent bladder gas is expected given instrumentation. Diffuse bladder wall thickening. Multiple layering bladder stones, largest 7 mm, similar. Stomach/Bowel: Normal non-distended stomach. Normal caliber small bowel with no small bowel wall thickening. Normal appendix. Large right colonic stool volume. New mild circumferential rectal wall thickening. New focal pericolonic fat stranding in the proximal sigmoid colon with central fat density suggestive acute epiploic appendagitis (series 2/image 59). New mild circumferential wall thickening in the rectum with increased presacral space edema. Vascular/Lymphatic: Atherosclerotic nonaneurysmal abdominal aorta. Patent portal, splenic, hepatic and renal veins. Previously visualized extensive retroperitoneal and left pelvic adenopathy has substantially decreased. Representative short axis diameter 2.1 cm left para-aortic node (series 2/image 39), previously 4.3 cm on 07/24/2019 CT using similar measurement technique. Representative 1.0 cm left common iliac node (series 2/image 64), previously 2.9 cm. Representative 1.4 cm left external iliac node (series 2/image 78),  previously 3.6 cm. No new abdominopelvic adenopathy. There is increased fat stranding throughout sites of adenopathy in the retroperitoneum and left pelvis. Reproductive: Moderately enlarged prostate. Other: No pneumoperitoneum, ascites or focal fluid collection. Musculoskeletal: No aggressive appearing focal osseous lesions. Mild thoracolumbar spondylosis.  IMPRESSION: 1. Evidence of significant partial treatment response with substantially decreased retroperitoneal and left pelvic adenopathy. 2. Findings suggestive of acute epiploic appendagitis in the proximal sigmoid colon in the left lower quadrant of the abdomen. 3. Nonspecific new mild circumferential rectal wall thickening, with increased presacral edema. Uncertain if these represent treatment effects or nonspecific proctitis. 4. Large right colonic stool volume suggests constipation. No bowel obstruction. No free air. 5. Diffuse bladder wall thickening, nonspecific, favor chronic given moderate prostatomegaly. Suggest correlation with urinalysis. Bladder decompressed by Foley catheter. Several layering bladder stones. 6. Nonobstructing left nephrolithiasis. 7.  Aortic Atherosclerosis (ICD10-I70.0). Electronically Signed   By: Ilona Sorrel M.Richards.   On: 09/27/2019 13:23   DVT Prophylaxis -SCD /eliquis AM Labs Ordered, also please review Full Orders  Family Communication: Admission, patients condition and plan of care including tests being ordered have been discussed with the patient and son [name]) * who indicate understanding and agree with the plan   Code Status - Full Code  Likely DC to  TBD  Condition   stable  Roxan Hockey M.Richards on 09/27/2019 at 5:23 PM Go to [email or web address] -  for contact info  [facility] - Office  [phone]

## 2019-09-27 NOTE — ED Notes (Signed)
CRITICAL VALUE ALERT  Critical Value:  Potassium 2.7   Date & Time Notied:  09/27/2019 @ [id number]  Provider Notified: Dr Marye Round  Orders Received/Actions taken: orders to be given

## 2019-09-28 LAB — GLUCOSE, CAPILLARY
Glucose-Capillary: 106 mg/dL — ABNORMAL HIGH (ref 70–99)
Glucose-Capillary: 110 mg/dL — ABNORMAL HIGH (ref 70–99)
Glucose-Capillary: 96 mg/dL (ref 70–99)

## 2019-09-28 LAB — CBC
HCT: 24.7 % — ABNORMAL LOW (ref 39.0–52.0)
Hemoglobin: 7.6 g/dL — ABNORMAL LOW (ref 13.0–17.0)
MCH: 31.1 pg (ref 26.0–34.0)
MCHC: 30.8 g/dL (ref 30.0–36.0)
MCV: 101.2 fL — ABNORMAL HIGH (ref 80.0–100.0)
Platelets: 586 10*3/uL — ABNORMAL HIGH (ref 150–400)
RBC: 2.44 MIL/uL — ABNORMAL LOW (ref 4.22–5.81)
RDW: 17.2 % — ABNORMAL HIGH (ref 11.5–15.5)
WBC: 8.7 10*3/uL (ref 4.0–10.5)
nRBC: 0 % (ref 0.0–0.2)

## 2019-09-28 LAB — BASIC METABOLIC PANEL
Anion gap: 9 (ref 5–15)
BUN: 17 mg/dL (ref 8–23)
CO2: 26 mmol/L (ref 22–32)
Calcium: 9.1 mg/dL (ref 8.9–10.3)
Chloride: 101 mmol/L (ref 98–111)
Creatinine, Ser: 1.18 mg/dL (ref 0.61–1.24)
GFR calc Af Amer: >60 mL/min (ref >60)
GFR calc non Af Amer: >60 mL/min (ref >60)
Glucose, Bld: 118 mg/dL — ABNORMAL HIGH (ref 70–99)
Potassium: 3.4 mmol/L — ABNORMAL LOW (ref 3.5–5.1)
Sodium: 136 mmol/L (ref 135–145)

## 2019-09-28 LAB — SARS CORONAVIRUS 2 (TAT 6-24 HRS): SARS Coronavirus 2: NEGATIVE

## 2019-09-28 MED ORDER — CHLORHEXIDINE GLUCONATE CLOTH 2 % EX PADS
6.0000 | MEDICATED_PAD | Freq: Every day | CUTANEOUS | Status: DC
  Administered 2019-09-28 – 2019-10-07 (×9): 6 via TOPICAL

## 2019-09-28 MED ORDER — BISACODYL 10 MG RE SUPP
10.0000 mg | Freq: Once | RECTAL | Status: AC
  Administered 2019-09-28: 10 mg via RECTAL
  Filled 2019-09-28: qty 1

## 2019-09-28 MED ORDER — POTASSIUM CHLORIDE CRYS ER 20 MEQ PO TBCR
40.0000 meq | EXTENDED_RELEASE_TABLET | ORAL | Status: AC
  Administered 2019-09-28 (×2): 40 meq via ORAL
  Filled 2019-09-28 (×2): qty 2

## 2019-09-28 MED ORDER — BISACODYL 10 MG RE SUPP
10.0000 mg | Freq: Once | RECTAL | Status: DC
  Filled 2019-09-28: qty 1

## 2019-09-28 MED ORDER — LACTULOSE 10 GM/15ML PO SOLN
30.0000 g | Freq: Once | ORAL | Status: AC
  Administered 2019-09-28: 30 g via ORAL
  Filled 2019-09-28: qty 60

## 2019-09-28 NOTE — Progress Notes (Signed)
Patient Demographics:    Daniel Richards, is a 73 y.o. male, DOB - 1946-10-05, [id number]  Admit date - 09/27/2019   Admitting Physician Daniel Richards [name], MD  Outpatient Primary MD for the patient is Daniel Sender, FNP  LOS - 1   Chief Complaint  Patient presents with  . 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, unspecified B-cell lymphoma type    Hypertension associated with type 2 diabetes mellitus (HCC)   PE (pulmonary thromboembolism) (HCC)   Generalized abdominal pain   Brief Summary:- 73 y.o.malewith a history of diabetes, hypertension, recent diagnosis of B-cell lymphoma with chemotherapy admitted on 09/27/2019 with significant left-sided abdominal pain consistent with epiploic appendagitis   A/P 1)Epiploic Appendagitis in the proximal sigmoid area ---  ischemic infarction of an epiploic appendage caused by torsion or spontaneous thrombosis of the epiploic appendage central draining vein----abdominal pain persist, continue as needed IV morphine sulfate  for pain control,  -conservative management with pain control as ordered -Anticipate resolution over the next few days -No indication for antibiotics, surgical intervention not indicated unless fails to resolve with conservative management  2)B-cell lymphoma---currently receiving chemotherapy from Dr. Delton Coombes,  -CT abdomen and pelvis from 09/27/2019 reveals significant partial treatment response to current  chemotherapy with substantially decreased retroperitoneal and left pelvic adenopathy  3)Possible UTI in the setting of BPH with LUTs---recurrent UTIs, chronic indwelling Foley catheter which was last changed on 09/15/2019 -Continue Flomax -Urine culture from 08/26/2019 with  pansensitive Proteus mirabilis and Enterococcus faecalis  -Treat empirically with Levaquin pending repeat urine culture  4)DM-excellent control, A1c 5.6, -Continue to hold Metformin due to contrast study -Patient has a UTI so STOP Invokana -Use Novolog/Humalog Sliding scale insulin with Accu-Cheks/Fingersticks as ordered   5)Depressive disorder--stable, continue Celexa, as well as Restoril as needed for sleep  6)HTN-okay to continue propranolol due to tremors, but stop amlodipine -Hold lisinopril  7)Generalized weakness/Debility--patient gets PT at home previously, most likely will need physical therapy again post discharge  8)Constipation--- significant constipation especially on the right side of the colon-  - c/n  laxatives as ordered  9))H/o PE-- Dxed in 08/2019--- c/n Eliquis indefinitely as patient has underlying lymphoma/malignancy  10) essential tremors--- continue propanolol  Disposition-Home  Code Status:Full  Family Communication: (patient is alert, awake and coherent) -Discussed with patient's son at bedside, questions answered Called and d/w Pt's daughter (POA) [name]   Disposition/Need for in-Hospital Stay- patient unable to be discharged at this time due to -IV antibiotics for UTI IV morphine sulfate for abdominal pain  Code Status : Full   Disposition Plan  : Anticipate discharge home with home health PT  Consults  :  Na  DVT Prophylaxis  : Eliquis- SCDs  Lab Results  Component Value Date   PLT 586 (H) 09/28/2019    Inpatient  Medications  Scheduled Meds: . allopurinol  300 mg Oral Daily  . apixaban  5 mg Oral BID  . bisacodyl  10 mg Rectal Once  .  calcitRIOL  0.5 mcg Oral Daily  . Chlorhexidine Gluconate Cloth  6 each Topical Daily  . citalopram  20 mg Oral QHS  . folic acid  1 mg Oral Daily  . gabapentin  600 mg Oral TID  . lactulose  30 g Oral Once  . pantoprazole  40 mg Oral BID AC  . polyethylene glycol  17 g Oral BID  . potassium chloride  40 mEq Oral Q3H  . primidone  75 mg Oral Q8H  . sodium chloride flush  3 mL Intravenous Q12H  . sucralfate  1 g Oral QID  . temazepam  15 mg Oral QHS  . topiramate  200 mg Oral QHS   Continuous Infusions: . sodium chloride 125 mL/hr at 09/27/19 1329  . sodium chloride    . sodium chloride 100 mL/hr at 09/28/19 0426  . fluconazole (DIFLUCAN) IV Stopped (09/27/19 1649)  . levofloxacin (LEVAQUIN) IV 750 mg (09/27/19 1841)   PRN Meds:.sodium chloride, acetaminophen **OR** acetaminophen, albuterol, morphine injection, ondansetron **OR** ondansetron (ZOFRAN) IV, oxyCODONE, prochlorperazine, sodium chloride flush, traZODone    Anti-infectives (From admission, onward)   Start     Dose/Rate Route Frequency Ordered Stop   09/27/19 2200  cefTRIAXone (ROCEPHIN) 1 g in sodium chloride 0.9 % 100 mL IVPB  Status:  Discontinued     1 g 200 mL/hr over 30 Minutes Intravenous Every 24 hours 09/27/19 1355 09/27/19 1404   09/27/19 1800  levofloxacin (LEVAQUIN) IVPB 750 mg     750 mg 100 mL/hr over 90 Minutes Intravenous Every 24 hours 09/27/19 1654     09/27/19 1500  fluconazole (DIFLUCAN) IVPB 200 mg     200 mg 100 mL/hr over 60 Minutes Intravenous Daily 09/27/19 1357 09/30/19 1459   09/27/19 1500  cefTRIAXone (ROCEPHIN) 2 g in sodium chloride 0.9 % 100 mL IVPB  Status:  Discontinued     2 g 200 mL/hr over 30 Minutes Intravenous Every 24 hours 09/27/19 1404 09/27/19 1633   09/27/19 1400  cefTRIAXone (ROCEPHIN) 1 g in sodium chloride 0.9 % 100 mL IVPB  Status:  Discontinued     1 g 200 mL/hr over 30 Minutes Intravenous  Once 09/27/19 1348 09/27/19 1404        Objective:   Vitals:   09/27/19  1700 09/27/19 1730 09/27/19 1808 09/28/19 0300  BP: (!) 141/74 (!) 141/68 135/71 (!) 129/57  Pulse: 86 90 96 (!) 110  Resp: 14 11 16 16   Temp:  99 F (37.2 C)  98.9 F (37.2 C)  TempSrc:    Oral  SpO2: 100% 100% 100% 96%  Weight:   81.5 kg   Height:   5\' 11"  (1.803 m)     Wt Readings from Last 3 Encounters:  09/27/19 81.5 kg  09/15/19 86.2 kg  09/10/19 77 kg     Intake/Output Summary (Last 24 hours) at 09/28/2019 0937 Last data filed at 09/28/2019 0600 Gross per 24 hour  Intake 1417.14 ml  Output 1700 ml  Net -282.86 ml     Physical Exam  Gen:- Awake Alert,  In no apparent distress  HEENT:- [place].AT, No sclera icterus Neck-Supple Neck,No JVD,.  Lungs-  CTAB , fair symmetrical air movement CV- S1, S2 normal, regular, , left subclavian area Port-A-Cath in situ clean Abd-  +ve B.Sounds, Abd Soft, distended  and tender Extremity/Skin:- No  edema, pedal pulses present  Psych-affect is appropriate, oriented x3 Neuro-no new focal deficits, essential tremors GU- indwelling foley in situ   Data Review:   Micro Results Recent Results (from the past 240 hour(s))  SARS CORONAVIRUS 2 (TAT 6-24 HRS) Nasopharyngeal Nasopharyngeal Swab     Status: None   Collection Time: 09/27/19  3:20 PM   Specimen: Nasopharyngeal Swab  Result Value Ref Range Status   SARS Coronavirus 2 NEGATIVE NEGATIVE Final    Comment: (NOTE) SARS-CoV-2 target nucleic acids are NOT DETECTED. The SARS-CoV-2 RNA is generally detectable in upper and lower respiratory specimens during the acute phase of infection. Negative results do not preclude SARS-CoV-2 infection, do not rule out co-infections with other pathogens, and should not be used as the sole basis for treatment or other patient management decisions. Negative results must be combined with clinical observations, patient history, and epidemiological information. The expected result is Negative. Fact Sheet for Patients:  SugarRoll.be Fact Sheet for Healthcare Providers: https://www.woods-mathews.com/ This test is not yet approved or cleared by the [place] FDA and  has been authorized for detection and/or diagnosis of SARS-CoV-2 by FDA under an Emergency Use Authorization (EUA). This EUA will remain  in effect (meaning this test can be used) for the duration of the COVID-19 declaration under Section 56 4(b)(1) of the Act, 21 U.S.C. section 360bbb-3(b)(1), unless the authorization is terminated or revoked sooner. Performed at [facility], [employer] [address], [place], [place] [address]     Radiology Reports Ct Abdomen Pelvis W Contrast  Result Date: 09/27/2019 CLINICAL DATA:  Left lower quadrant abdominal pain for several days. Lymphoma on chemotherapy. EXAM: CT ABDOMEN AND PELVIS WITH CONTRAST TECHNIQUE: Multidetector CT imaging of the abdomen and pelvis was performed using the standard protocol following bolus administration of intravenous contrast. CONTRAST:  111mL OMNIPAQUE IOHEXOL 300 MG/ML  SOLN COMPARISON:  08/07/2019 PET-CT.  07/24/2019 CT abdomen/pelvis. FINDINGS: Lower chest: No significant pulmonary nodules or acute consolidative airspace disease. Coronary atherosclerosis. Hepatobiliary: Normal liver size. No liver mass. Mild layering sludge in the gallbladder. No gallbladder wall thickening. No radiopaque cholelithiasis. No pericholecystic fluid. No biliary ductal dilatation. Pancreas: Normal, with no mass or duct dilation. Spleen: Normal size. No mass. Adrenals/Urinary Tract: Normal adrenals. Nonobstructing 2 mm upper and interpolar left renal stones. No hydronephrosis. Simple exophytic 1.7 cm posterior interpolar left renal cyst. Stable subcentimeter hypodense renal cortical lesions in right kidney, too small to characterize. Bladder collapsed by indwelling Foley catheter. Scattered minimal nondependent bladder gas is expected given instrumentation.  Diffuse bladder wall thickening. Multiple layering bladder stones, largest 7 mm, similar. Stomach/Bowel: Normal non-distended stomach. Normal caliber small bowel with no small bowel wall thickening. Normal appendix. Large right colonic stool volume. New mild circumferential rectal wall thickening. New focal pericolonic fat stranding in the proximal sigmoid colon with central fat density suggestive acute epiploic appendagitis (series 2/image 59). New mild circumferential wall thickening in the rectum with increased presacral space edema. Vascular/Lymphatic: Atherosclerotic nonaneurysmal abdominal aorta. Patent portal, splenic, hepatic and renal veins. Previously visualized extensive retroperitoneal and left pelvic adenopathy has substantially decreased. Representative short axis diameter 2.1 cm left para-aortic node (series 2/image 39), previously 4.3 cm on 07/24/2019 CT using similar measurement technique. Representative 1.0 cm left common iliac node (series 2/image 64), previously 2.9 cm. Representative 1.4 cm left external iliac node (series 2/image 78), previously 3.6 cm. No new abdominopelvic adenopathy. There is increased fat stranding throughout sites of adenopathy in the retroperitoneum  and left pelvis. Reproductive: Moderately enlarged prostate. Other: No pneumoperitoneum, ascites or focal fluid collection. Musculoskeletal: No aggressive appearing focal osseous lesions. Mild thoracolumbar spondylosis. IMPRESSION: 1. Evidence of significant partial treatment response with substantially decreased retroperitoneal and left pelvic adenopathy. 2. Findings suggestive of acute epiploic appendagitis in the proximal sigmoid colon in the left lower quadrant of the abdomen. 3. Nonspecific new mild circumferential rectal wall thickening, with increased presacral edema. Uncertain if these represent treatment effects or nonspecific proctitis. 4. Large right colonic stool volume suggests constipation. No bowel obstruction.  No free air. 5. Diffuse bladder wall thickening, nonspecific, favor chronic given moderate prostatomegaly. Suggest correlation with urinalysis. Bladder decompressed by Foley catheter. Several layering bladder stones. 6. Nonobstructing left nephrolithiasis. 7.  Aortic Atherosclerosis (ICD10-I70.0). Electronically Signed   By: Ilona Sorrel M.D.   On: 09/27/2019 13:23     CBC Recent Labs  Lab 09/27/19 0853 09/28/19 0622  WBC 9.4 8.7  HGB 8.7* 7.6*  HCT 27.5* 24.7*  PLT 699* 586*  MCV 100.0 101.2*  MCH 31.6 31.1  MCHC 31.6 30.8  RDW 17.1* 17.2*  LYMPHSABS 0.9  --   MONOABS 0.7  --   EOSABS 0.0  --   BASOSABS 0.1  --     Chemistries  Recent Labs  Lab 09/27/19 0853 09/28/19 0622  NA 139 136  K 2.7* 3.4*  CL 98 101  CO2 30 26  GLUCOSE 165* 118*  BUN 24* 17  CREATININE 1.25* 1.18  CALCIUM 10.7* 9.1  MG 2.1  --   AST 20  --   ALT 16  --   ALKPHOS 106  --   BILITOT 0.3  --    ------------------------------------------------------------------------------------------------------------------ No results for input(s): CHOL, HDL, LDLCALC, TRIG, CHOLHDL, LDLDIRECT in the last 72 hours.  Lab Results  Component Value Date   HGBA1C 5.6 07/24/2019   ------------------------------------------------------------------------------------------------------------------ No results for input(s): TSH, T4TOTAL, T3FREE, THYROIDAB in the last 72 hours.  Invalid input(s): FREET3 ------------------------------------------------------------------------------------------------------------------ No results for input(s): VITAMINB12, FOLATE, FERRITIN, TIBC, IRON, RETICCTPCT in the last 72 hours.  Coagulation profile No results for input(s): INR, PROTIME in the last 168 hours.  No results for input(s): DDIMER in the last 72 hours.  Cardiac Enzymes No results for input(s): CKMB, TROPONINI, MYOGLOBIN in the last 168 hours.  Invalid input(s): CK  ------------------------------------------------------------------------------------------------------------------ No results found for: BNP   Roxan Hockey M.D on 09/28/2019 at 9:37 AM  Go to [email or web address] - for contact info  [facility] - Office  [phone]

## 2019-09-29 LAB — GLUCOSE, CAPILLARY
Glucose-Capillary: 108 mg/dL — ABNORMAL HIGH (ref 70–99)
Glucose-Capillary: 101 mg/dL — ABNORMAL HIGH (ref 70–99)
Glucose-Capillary: 94 mg/dL (ref 70–99)
Glucose-Capillary: 133 mg/dL — ABNORMAL HIGH (ref 70–99)
Glucose-Capillary: 125 mg/dL — ABNORMAL HIGH (ref 70–99)

## 2019-09-29 MED ORDER — SODIUM CHLORIDE 0.9 % IV SOLN
1.0000 g | INTRAVENOUS | Status: DC
  Administered 2019-09-29 – 2019-09-30 (×2): 1 g via INTRAVENOUS
  Filled 2019-09-29 (×2): qty 10

## 2019-09-29 MED ORDER — LACTULOSE 10 GM/15ML PO SOLN
30.0000 g | Freq: Once | ORAL | Status: DC
  Filled 2019-09-29: qty 60

## 2019-09-29 NOTE — Progress Notes (Signed)
Patient Demographics:    Daniel Richards, is a 73 y.o. male, DOB - 23-Oct-1946, [id number]  Admit date - 09/27/2019   Admitting Physician Daniel Giacomo [name], MD  Outpatient Primary MD for the patient is Daniel Sender, FNP  LOS - 2   Chief Complaint  Patient presents with  . 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, unspecified B-cell lymphoma type    Hypertension associated with type 2 diabetes mellitus (HCC)   PE (pulmonary thromboembolism) (HCC)   Generalized abdominal pain   Brief Summary:- 73 y.o.malewith a history of diabetes, hypertension, recent diagnosis of B-cell lymphoma with chemotherapy admitted on 09/27/2019 with significant left-sided abdominal pain consistent with epiploic appendagitis   A/P 1)Epiploic Appendagitis in the proximal sigmoid area ---  ischemic infarction of an epiploic appendage caused by torsion or spontaneous thrombosis of the epiploic appendage central draining vein----abdominal pain is not worse, continue as needed IV morphine sulfate  for pain control,  -conservative management with pain control as ordered -Anticipate resolution over the next few days -No indication for antibiotics, surgical intervention not indicated unless fails to resolve with conservative management  2)B-cell lymphoma---currently receiving chemotherapy from Dr. Delton Coombes,  -CT abdomen and pelvis from 09/27/2019 reveals significant partial  treatment response to current chemotherapy with substantially decreased retroperitoneal and left pelvic adenopathy  3)Possible UTI in the setting of BPH with LUTs---recurrent UTIs, chronic indwelling Foley catheter which was last changed on 09/15/2019 -Continue Flomax -Urine culture from 08/26/2019 with  pansensitive Proteus mirabilis and Enterococcus faecalis  -Repeat urine culture this admission with Proteus, treat empirically with Rocephin, stop Levaquin for now  4)DM-excellent control, A1c 5.6, -Continue to hold Metformin due to contrast study -Patient has a UTI so STOP Invokana -Use Novolog/Humalog Sliding scale insulin with Accu-Cheks/Fingersticks as ordered   5)Depressive disorder--stable, continue Celexa, as well as Restoril as needed for sleep  6)HTN-okay to continue propranolol due to tremors, but stop amlodipine -Hold lisinopril  7)Generalized weakness/Debility--patient gets PT at home previously, most likely will need physical therapy again post discharge  8)Constipation--- significant constipation especially on the right side of the colon-  - c/n  laxatives as ordered  9))H/o PE-- Dxed in 08/2019--- c/n Eliquis indefinitely as patient has underlying lymphoma/malignancy  10) essential tremors--- continue propanolol  Disposition-Home  Code Status:Full  Family Communication: (patient is alert, awake and coherent) -Discussed with patient's son at bedside, questions answered Called and d/w Pt's daughter (POA) [name]   Disposition/Need for in-Hospital Stay- patient unable to be discharged at this time due to -IV antibiotics for UTI IV morphine sulfate for abdominal pain  Code Status : Full   Disposition Plan  : Anticipate discharge home with home health PT  Consults  :  Na  DVT Prophylaxis  : Eliquis-  SCDs  Lab Results  Component Value Date   PLT 586 (H) 09/28/2019    Inpatient Medications  Scheduled Meds: . allopurinol  300 mg Oral  Daily  . apixaban  5 mg Oral BID  . bisacodyl  10 mg Rectal Once  . calcitRIOL  0.5 mcg Oral Daily  . Chlorhexidine Gluconate Cloth  6 each Topical Daily  . citalopram  20 mg Oral QHS  . folic acid  1 mg Oral Daily  . gabapentin  600 mg Oral TID  . lactulose  30 g Oral Once  . pantoprazole  40 mg Oral BID AC  . polyethylene glycol  17 g Oral BID  . primidone  75 mg Oral Q8H  . sodium chloride flush  3 mL Intravenous Q12H  . sucralfate  1 g Oral QID  . temazepam  15 mg Oral QHS  . topiramate  200 mg Oral QHS   Continuous Infusions: . sodium chloride 125 mL/hr at 09/27/19 1329  . sodium chloride    . sodium chloride 100 mL/hr at 09/29/19 1440  . cefTRIAXone (ROCEPHIN)  IV 1 g (09/29/19 1821)   PRN Meds:.sodium chloride, acetaminophen **OR** acetaminophen, albuterol, morphine injection, ondansetron **OR** ondansetron (ZOFRAN) IV, oxyCODONE, prochlorperazine, sodium chloride flush, traZODone    Anti-infectives (From admission, onward)   Start     Dose/Rate Route Frequency Ordered Stop   09/29/19 1800  cefTRIAXone (ROCEPHIN) 1 g in sodium chloride 0.9 % 100 mL IVPB     1 g 200 mL/hr over 30 Minutes Intravenous Every 24 hours 09/29/19 1406     09/27/19 2200  cefTRIAXone (ROCEPHIN) 1 g in sodium chloride 0.9 % 100 mL IVPB  Status:  Discontinued     1 g 200 mL/hr over 30 Minutes Intravenous Every 24 hours 09/27/19 1355 09/27/19 1404   09/27/19 1800  levofloxacin (LEVAQUIN) IVPB 750 mg  Status:  Discontinued     750 mg 100 mL/hr over 90 Minutes Intravenous Every 24 hours 09/27/19 1654 09/29/19 1406   09/27/19 1500  fluconazole (DIFLUCAN) IVPB 200 mg     200 mg 100 mL/hr over 60 Minutes Intravenous Daily 09/27/19 1357 09/29/19 1732   09/27/19 1500  cefTRIAXone (ROCEPHIN) 2 g in sodium chloride 0.9 % 100 mL IVPB  Status:  Discontinued     2 g 200 mL/hr over 30 Minutes Intravenous Every 24 hours 09/27/19 1404 09/27/19 1633   09/27/19 1400  cefTRIAXone (ROCEPHIN) 1 g in sodium chloride  0.9 % 100 mL IVPB  Status:  Discontinued     1 g 200 mL/hr over 30 Minutes Intravenous  Once 09/27/19 1348 09/27/19 1404        Objective:   Vitals:   09/28/19 0300 09/28/19 1302 09/28/19 2138 09/29/19 1258  BP: (!) 129/57 134/72 123/70 133/62  Pulse: (!) 110 (!) 101 (!) 107 (!) 103  Resp: 16 20  18   Temp: 98.9 F (37.2 C) (!) 97.4 F (36.3 C) 98.7 F (37.1 C) 98 F (36.7 C)  TempSrc: Oral Oral Oral   SpO2: 96% 95% 95%   Weight:      Height:        Wt Readings from Last 3 Encounters:  09/27/19 81.5 kg  09/15/19 86.2 kg  09/10/19 77 kg     Intake/Output Summary (Last 24 hours) at 09/29/2019 1916 Last data filed at 09/29/2019 1700 Gross per 24 hour  Intake 480 ml  Output 1200 ml  Net -720 ml     Physical Exam  Gen:- Awake Alert,  In no apparent distress  HEENT:- [place].AT, No sclera icterus Neck-Supple Neck,No JVD,.  Lungs-  CTAB , fair symmetrical air movement CV- S1, S2 normal, regular, , left subclavian area Port-A-Cath in situ clean Abd-  +ve B.Sounds, Abd Soft, less distended and less tender Extremity/Skin:- No  edema, pedal pulses present  Psych-affect is appropriate, oriented x3 Neuro-no new focal deficits, essential tremors GU- indwelling foley in situ   Data Review:   Micro Results Recent Results (from the past 240 hour(s))  Urine culture     Status: Abnormal (Preliminary result)   Collection Time: 09/27/19  1:49 PM   Specimen: Urine, Clean Catch  Result Value Ref Range Status   Specimen Description   Final    URINE, CLEAN CATCH Performed at [facility], [address], [place], [place] [address]    Special Requests   Final    NONE Performed at [facility], [address] [place], [place] [address]    Culture (A)  Final    >=100,000 COLONIES/mL PROTEUS MIRABILIS SUSCEPTIBILITIES TO FOLLOW Performed at [facility] [address] [place], [place] [address]    Report Status PENDING  Incomplete  SARS CORONAVIRUS 2 (TAT 6-24 HRS)  Nasopharyngeal Nasopharyngeal Swab     Status: None   Collection Time: 09/27/19  3:20 PM   Specimen: Nasopharyngeal Swab  Result Value Ref Range Status   SARS Coronavirus 2 NEGATIVE NEGATIVE Final    Comment: (NOTE) SARS-CoV-2 target nucleic acids are NOT DETECTED. The SARS-CoV-2 RNA is generally detectable in upper and lower respiratory specimens during the acute phase of infection. Negative results do not preclude SARS-CoV-2 infection, do not rule out co-infections with other pathogens, and should not be used as the sole basis for treatment or other patient management decisions. Negative results must be combined with clinical observations, patient history, and epidemiological information. The expected result is Negative. Fact Sheet for Patients: SugarRoll.be Fact Sheet for Healthcare Providers: https://www.woods-mathews.com/ This test is not yet approved or cleared by the [place] FDA and  has been authorized for detection and/or diagnosis of SARS-CoV-2 by FDA under an Emergency Use Authorization (EUA). This EUA will remain  in effect (meaning this test can be used) for the duration of the COVID-19 declaration under Section 56 4(b)(1) of the Act, 21 U.S.C. section 360bbb-3(b)(1), unless the authorization is terminated or revoked sooner. Performed at [facility], [place] [address], [place],  [address]     Radiology Reports Ct Abdomen Pelvis W Contrast  Result Date: 09/27/2019 CLINICAL DATA:  Left lower quadrant abdominal pain for several days. Lymphoma on chemotherapy. EXAM: CT ABDOMEN AND PELVIS WITH CONTRAST TECHNIQUE: Multidetector CT imaging of the abdomen and pelvis was performed using the standard protocol following bolus administration of intravenous contrast. CONTRAST:  131mL OMNIPAQUE IOHEXOL 300 MG/ML  SOLN COMPARISON:  08/07/2019 PET-CT.  07/24/2019 CT abdomen/pelvis. FINDINGS: Lower chest: No significant  pulmonary nodules or acute consolidative airspace disease. Coronary atherosclerosis. Hepatobiliary: Normal liver size. No liver mass. Mild layering sludge in the gallbladder. No gallbladder wall thickening. No radiopaque cholelithiasis. No pericholecystic fluid. No biliary ductal dilatation. Pancreas: Normal, with no mass or duct dilation. Spleen: Normal size. No mass. Adrenals/Urinary Tract: Normal adrenals. Nonobstructing 2 mm upper and interpolar left renal stones. No hydronephrosis. Simple exophytic 1.7 cm posterior interpolar left renal cyst. Stable subcentimeter hypodense renal cortical lesions in right kidney, too small to characterize. Bladder collapsed by indwelling Foley catheter. Scattered minimal nondependent bladder gas  is expected given instrumentation. Diffuse bladder wall thickening. Multiple layering bladder stones, largest 7 mm, similar. Stomach/Bowel: Normal non-distended stomach. Normal caliber small bowel with no small bowel wall thickening. Normal appendix. Large right colonic stool volume. New mild circumferential rectal wall thickening. New focal pericolonic fat stranding in the proximal sigmoid colon with central fat density suggestive acute epiploic appendagitis (series 2/image 59). New mild circumferential wall thickening in the rectum with increased presacral space edema. Vascular/Lymphatic: Atherosclerotic nonaneurysmal abdominal aorta. Patent portal, splenic, hepatic and renal veins. Previously visualized extensive retroperitoneal and left pelvic adenopathy has substantially decreased. Representative short axis diameter 2.1 cm left para-aortic node (series 2/image 39), previously 4.3 cm on 07/24/2019 CT using similar measurement technique. Representative 1.0 cm left common iliac node (series 2/image 64), previously 2.9 cm. Representative 1.4 cm left external iliac node (series 2/image 78), previously 3.6 cm. No new abdominopelvic adenopathy. There is increased fat stranding throughout  sites of adenopathy in the retroperitoneum and left pelvis. Reproductive: Moderately enlarged prostate. Other: No pneumoperitoneum, ascites or focal fluid collection. Musculoskeletal: No aggressive appearing focal osseous lesions. Mild thoracolumbar spondylosis. IMPRESSION: 1. Evidence of significant partial treatment response with substantially decreased retroperitoneal and left pelvic adenopathy. 2. Findings suggestive of acute epiploic appendagitis in the proximal sigmoid colon in the left lower quadrant of the abdomen. 3. Nonspecific new mild circumferential rectal wall thickening, with increased presacral edema. Uncertain if these represent treatment effects or nonspecific proctitis. 4. Large right colonic stool volume suggests constipation. No bowel obstruction. No free air. 5. Diffuse bladder wall thickening, nonspecific, favor chronic given moderate prostatomegaly. Suggest correlation with urinalysis. Bladder decompressed by Foley catheter. Several layering bladder stones. 6. Nonobstructing left nephrolithiasis. 7.  Aortic Atherosclerosis (ICD10-I70.0). Electronically Signed   By: Ilona Sorrel M.D.   On: 09/27/2019 13:23     CBC Recent Labs  Lab 09/27/19 0853 09/28/19 0622  WBC 9.4 8.7  HGB 8.7* 7.6*  HCT 27.5* 24.7*  PLT 699* 586*  MCV 100.0 101.2*  MCH 31.6 31.1  MCHC 31.6 30.8  RDW 17.1* 17.2*  LYMPHSABS 0.9  --   MONOABS 0.7  --   EOSABS 0.0  --   BASOSABS 0.1  --     Chemistries  Recent Labs  Lab 09/27/19 0853 09/28/19 0622  NA 139 136  K 2.7* 3.4*  CL 98 101  CO2 30 26  GLUCOSE 165* 118*  BUN 24* 17  CREATININE 1.25* 1.18  CALCIUM 10.7* 9.1  MG 2.1  --   AST 20  --   ALT 16  --   ALKPHOS 106  --   BILITOT 0.3  --    ------------------------------------------------------------------------------------------------------------------ No results for input(s): CHOL, HDL, LDLCALC, TRIG, CHOLHDL, LDLDIRECT in the last 72 hours.  Lab Results  Component Value Date    HGBA1C 5.6 07/24/2019   ------------------------------------------------------------------------------------------------------------------ No results for input(s): TSH, T4TOTAL, T3FREE, THYROIDAB in the last 72 hours.  Invalid input(s): FREET3 ------------------------------------------------------------------------------------------------------------------ No results for input(s): VITAMINB12, FOLATE, FERRITIN, TIBC, IRON, RETICCTPCT in the last 72 hours.  Coagulation profile No results for input(s): INR, PROTIME in the last 168 hours.  No results for input(s): DDIMER in the last 72 hours.  Cardiac Enzymes No results for input(s): CKMB, TROPONINI, MYOGLOBIN in the last 168 hours.  Invalid input(s): CK ------------------------------------------------------------------------------------------------------------------ No results found for: BNP   Roxan Hockey M.D on 09/29/2019 at 7:16 PM  Go to [email or web address] - for contact info  [facility] - Office  [phone]

## 2019-09-29 NOTE — Progress Notes (Signed)
Initial Nutrition Assessment  DOCUMENTATION CODES:      INTERVENTION:  Ensure Enlive po BID, each supplement provides 350 kcal and 20 grams of protein. Please assist patient with consuming.  Please assist with feeding ALL meals  NUTRITION DIAGNOSIS:  Increased nutrient needs related to cancer and cancer related treatments as evidenced by estimated needs.  GOAL:  Patient will meet greater than or equal to 90% of their needs    MONITOR:  PO intake, Supplement acceptance, Labs, Weight trends   REASON FOR ASSESSMENT:  Malnutrition Screening Tool    ASSESSMENT: Mr Gane is a 73 yo male with hx of DM2, HTN, HLD, Lymphoma (port a cath 08/13/19)- receiving chemotherapy.  Vitamin D deficiency.  Presents with c/o abdominal pain.  History of tremors makes self feeding very difficult for him. Patient is afraid to move his left arm due to IV alarm sounding.  Regular diet at home. He has assistance with feeding- daughter and son-in law. Daughter prepares breakfast and son in law cooks dinner daily. He drinks a chocolate nutrition shake for lunch. Increased risk for malnutrition due to dependent on others for food preparation and feeding.  Patient wt hx reviewed. Unable to evaluate percent change due to high variability the past few months. At risk nutritionally due to limited function (pt says he doesn't walk-"too weak"). Risk is increased     Medications reviewed and include: folvite,dulcolax, protonix, miralax, lactulose.  Labs reviewed:  BMP Latest Ref Rng & Units 09/28/2019 09/27/2019 09/15/2019  Glucose 70 - 99 mg/dL 118(H) 165(H) 255(H)  BUN 8 - 23 mg/dL 17 24(H) 56(H)  Creatinine 0.61 - 1.24 mg/dL 1.18 1.25(H) 1.42(H)  BUN/Creat Ratio 10 - 22 - - -  Sodium 135 - 145 mmol/L 136 139 135  Potassium 3.5 - 5.1 mmol/L 3.4(L) 2.7(LL) 4.5  Chloride 98 - 111 mmol/L 101 98 102  CO2 22 - 32 mmol/L 26 30 24   Calcium 8.9 - 10.3 mg/dL 9.1 10.7(H) 8.9     NUTRITION - FOCUSED PHYSICAL  EXAM: Moderate buccal fat loss and moderate temporal muscle depletion.  Diet Order:   Diet Order            Diet full liquid Room service appropriate? Yes; Fluid consistency: Thin  Diet effective now             EDUCATION NEEDS:   No education needs have been identified at this time Skin:  Skin Assessment: Reviewed RN Assessment  Last BM:  11/23  Height:   Ht Readings from Last 1 Encounters:  09/27/19 5\' 11"  (1.803 m)    Weight:   Wt Readings from Last 1 Encounters:  09/27/19 81.5 kg    Ideal Body Weight:  78 kg  BMI:  Body mass index is 25.06 kg/m.  Estimated Nutritional Needs:   Kcal:  [id number]  Protein:  95-99 gr  Fluid:  1.9-2.2 liters daily  Colman Cater MS,RD,CSG,LDN Office: [phone] Pager: [phone]

## 2019-09-29 NOTE — Care Management Important Message (Signed)
Important Message  Patient Details  Name: Daniel Richards MRN: [id number] Date of Birth: [birth year]   Medicare Important Message Given:  Yes     [name] 09/29/2019, 2:31 PM

## 2019-09-30 LAB — BASIC METABOLIC PANEL
Anion gap: 8 (ref 5–15)
BUN: 12 mg/dL (ref 8–23)
CO2: 23 mmol/L (ref 22–32)
Calcium: 9.1 mg/dL (ref 8.9–10.3)
Chloride: 110 mmol/L (ref 98–111)
Creatinine, Ser: 1.25 mg/dL — ABNORMAL HIGH (ref 0.61–1.24)
GFR calc Af Amer: >60 mL/min (ref >60)
GFR calc non Af Amer: 56 mL/min — ABNORMAL LOW (ref >60)
Glucose, Bld: 118 mg/dL — ABNORMAL HIGH (ref 70–99)
Potassium: 2.7 mmol/L — CL (ref 3.5–5.1)
Sodium: 141 mmol/L (ref 135–145)

## 2019-09-30 LAB — CBC
HCT: 25.1 % — ABNORMAL LOW (ref 39.0–52.0)
Hemoglobin: 7.5 g/dL — ABNORMAL LOW (ref 13.0–17.0)
MCH: 30.5 pg (ref 26.0–34.0)
MCHC: 29.9 g/dL — ABNORMAL LOW (ref 30.0–36.0)
MCV: 102 fL — ABNORMAL HIGH (ref 80.0–100.0)
Platelets: 479 10*3/uL — ABNORMAL HIGH (ref 150–400)
RBC: 2.46 MIL/uL — ABNORMAL LOW (ref 4.22–5.81)
RDW: 17.4 % — ABNORMAL HIGH (ref 11.5–15.5)
WBC: 6.1 10*3/uL (ref 4.0–10.5)
nRBC: 0 % (ref 0.0–0.2)

## 2019-09-30 LAB — URINE CULTURE: Culture: >=100000 — AB

## 2019-09-30 LAB — GLUCOSE, CAPILLARY
Glucose-Capillary: 129 mg/dL — ABNORMAL HIGH (ref 70–99)
Glucose-Capillary: 119 mg/dL — ABNORMAL HIGH (ref 70–99)
Glucose-Capillary: 108 mg/dL — ABNORMAL HIGH (ref 70–99)

## 2019-09-30 MED ORDER — POTASSIUM CHLORIDE CRYS ER 20 MEQ PO TBCR
40.0000 meq | EXTENDED_RELEASE_TABLET | ORAL | Status: AC
  Administered 2019-09-30 (×2): 40 meq via ORAL
  Filled 2019-09-30 (×2): qty 2

## 2019-09-30 MED ORDER — POTASSIUM CHLORIDE CRYS ER 20 MEQ PO TBCR
40.0000 meq | EXTENDED_RELEASE_TABLET | Freq: Once | ORAL | Status: AC
  Administered 2019-09-30: 40 meq via ORAL
  Filled 2019-09-30: qty 2

## 2019-09-30 NOTE — Progress Notes (Signed)
Patient Demographics:    Daniel Richards, is a 73 y.o. male, DOB - Aug 14, 1946, [id number]  Admit date - 09/27/2019   Admitting Physician Taisei Bonnette [name], MD  Outpatient Primary MD for the patient is Daniel Sender, FNP  LOS - 3   Chief Complaint  Patient presents with   Abdominal Pain        Subjective:    Daniel Richards today has no fevers, no emesis,  No chest pain,    -Abdominal pain is better--- Had multiple BMs    Assessment  & Plan :    Principal Problem:   Epiploic appendagitis Active Problems:   Urinary tract infection associated with indwelling urethral catheter (HCC)   Type 2 diabetes, controlled, with peripheral neuropathy (HCC)   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, unspecified B-cell lymphoma type    Hypertension associated with type 2 diabetes mellitus ([place])   PE (pulmonary thromboembolism) ([place])   Generalized abdominal pain   Brief Summary:- 73 y.o.malewith a history of diabetes, hypertension, recent diagnosis of B-cell lymphoma with chemotherapy admitted on 09/27/2019 with significant left-sided abdominal pain consistent with epiploic appendagitis   A/P 1)Epiploic Appendagitis in the proximal sigmoid area ---  ischemic infarction of an epiploic appendage caused by torsion or spontaneous thrombosis of the epiploic appendage central draining vein----abdominal pain is improving, - continue as needed IV morphine sulfate  for pain control,  -conservative management with pain control as ordered -Anticipate resolution over the next few days -No indication for antibiotics, surgical intervention not indicated unless fails to resolve with conservative management  2)B-cell lymphoma---currently receiving chemotherapy from Dr. Delton Coombes,  -CT abdomen and pelvis from 09/27/2019 reveals significant partial treatment response to current  chemotherapy with substantially decreased retroperitoneal and left pelvic adenopathy  3)Proteus catheter associated/complicated UTI in the setting of BPH with LUTs---recurrent UTIs, chronic indwelling Foley catheter which was last changed on 09/15/2019 -Continue Flomax -Urine culture from 08/26/2019 with  pansensitive Proteus mirabilis and Enterococcus faecalis  -Repeat urine culture this admission with Proteus, -Patient was treated with Levaquin, switched to Rocephin, okay to de-escalate to Keflex on 10/01/2019   4)DM-excellent control, A1c 5.6, -Continue to hold Metformin due to contrast study -Patient has a UTI so STOP Invokana -Use Novolog/Humalog Sliding scale insulin with Accu-Cheks/Fingersticks as ordered   5)Depressive disorder--stable, continue Celexa, as well as Restoril as needed for sleep  6)HTN-okay to continue propranolol due to tremors, but stop amlodipine -Hold lisinopril  7)Generalized weakness/Debility--patient gets PT at home previously, most likely will need physical therapy again post discharge  8)Constipation--- significant constipation especially on the right side of the colon-  -Improved with laxatives - c/n  laxatives as ordered  9))H/o PE-- Dxed in 08/2019--- c/n Eliquis indefinitely as patient has underlying lymphoma/malignancy  10) essential tremors--- continue propanolol  -Disposition--physical therapist recommends SNF rehab, patient wants to go home with home health services  Disposition-Home  Code Status:Full  Family Communication: (patient is alert, awake and coherent) -Discussed with patient's son at bedside, questions answered Called and d/w Pt's daughter (POA) Daniel Richards   Disposition/Need for in-Hospital Stay- patient unable to be discharged at this time due to -IV antibiotics for UTI IV morphine sulfate for abdominal pain  Code Status : Full   Disposition  Plan  : Anticipate discharge home with home health PT versus  SNF  Consults  :  Na  DVT Prophylaxis  : Eliquis- SCDs  Lab Results  Component Value Date   PLT 479 (H) 09/30/2019    Inpatient Medications  Scheduled Meds:  allopurinol  300 mg Oral Daily   apixaban  5 mg Oral BID   bisacodyl  10 mg Rectal Once   calcitRIOL  0.5 mcg Oral Daily   Chlorhexidine Gluconate Cloth  6 each Topical Daily   citalopram  20 mg Oral QHS   folic acid  1 mg Oral Daily   gabapentin  600 mg Oral TID   lactulose  30 g Oral Once   pantoprazole  40 mg Oral BID AC   polyethylene glycol  17 g Oral BID   potassium chloride  40 mEq Oral Once   primidone  75 mg Oral Q8H   sodium chloride flush  3 mL Intravenous Q12H   sucralfate  1 g Oral QID   temazepam  15 mg Oral QHS   topiramate  200 mg Oral QHS   Continuous Infusions:  sodium chloride 50 mL/hr at 09/30/19 0615   sodium chloride     cefTRIAXone (ROCEPHIN)  IV 1 g (09/30/19 1719)   PRN Meds:.sodium chloride, acetaminophen **OR** acetaminophen, albuterol, morphine injection, ondansetron **OR** ondansetron (ZOFRAN) IV, oxyCODONE, prochlorperazine, sodium chloride flush, traZODone    Anti-infectives (From admission, onward)   Start     Dose/Rate Route Frequency Ordered Stop   09/29/19 1800  cefTRIAXone (ROCEPHIN) 1 g in sodium chloride 0.9 % 100 mL IVPB     1 g 200 mL/hr over 30 Minutes Intravenous Every 24 hours 09/29/19 1406     09/27/19 2200  cefTRIAXone (ROCEPHIN) 1 g in sodium chloride 0.9 % 100 mL IVPB  Status:  Discontinued     1 g 200 mL/hr over 30 Minutes Intravenous Every 24 hours 09/27/19 1355 09/27/19 1404   09/27/19 1800  levofloxacin (LEVAQUIN) IVPB 750 mg  Status:  Discontinued     750 mg 100 mL/hr over 90 Minutes Intravenous Every 24 hours 09/27/19 1654 09/29/19 1406   09/27/19 1500  fluconazole (DIFLUCAN) IVPB 200 mg     200 mg 100 mL/hr over 60 Minutes Intravenous Daily 09/27/19 1357 09/29/19 1732   09/27/19 1500  cefTRIAXone (ROCEPHIN) 2 g in sodium chloride 0.9 %  100 mL IVPB  Status:  Discontinued     2 g 200 mL/hr over 30 Minutes Intravenous Every 24 hours 09/27/19 1404 09/27/19 1633   09/27/19 1400  cefTRIAXone (ROCEPHIN) 1 g in sodium chloride 0.9 % 100 mL IVPB  Status:  Discontinued     1 g 200 mL/hr over 30 Minutes Intravenous  Once 09/27/19 1348 09/27/19 1404        Objective:   Vitals:   09/28/19 2138 09/29/19 1258 09/29/19 2146 09/30/19 0602  BP: 123/70 133/62 136/85 (!) 146/74  Pulse: (!) 107 (!) 103 (!) 102 96  Resp:  18 20 20   Temp: 98.7 F (37.1 C) 98 F (36.7 C) 98.5 F (36.9 C) 98.1 F (36.7 C)  TempSrc: Oral  Oral Oral  SpO2: 95%  91% 92%  Weight:      Height:        Wt Readings from Last 3 Encounters:  09/27/19 81.5 kg  09/15/19 86.2 kg  09/10/19 77 kg     Intake/Output Summary (Last 24 hours) at 09/30/2019 1816 Last data filed at 09/30/2019 1500 Gross  per 24 hour  Intake 415 ml  Output 1678 ml  Net -1263 ml     Physical Exam  Gen:- Awake Alert,  In no apparent distress  HEENT:- [place].AT, No sclera icterus Neck-Supple Neck,No JVD,.  Lungs-  CTAB , fair symmetrical air movement CV- S1, S2 normal, regular, left subclavian area Port-A-Cath in situ clean Abd-  +ve B.Sounds, Abd Soft, less distended and less tender Extremity/Skin:- No  edema, pedal pulses present  Psych-affect is appropriate, oriented x3 Neuro-generalized weakness, no new focal deficits, essential tremors GU- indwelling foley in situ   Data Review:   Micro Results Recent Results (from the past 240 hour(s))  Urine culture     Status: Abnormal   Collection Time: 09/27/19  1:49 PM   Specimen: Urine, Clean Catch  Result Value Ref Range Status   Specimen Description   Final    URINE, CLEAN CATCH Performed at [facility], [address], [place], [place] [address]    Special Requests   Final    NONE Performed at [facility], [address], [place], [place] [address]    Culture >=100,000 COLONIES/mL PROTEUS MIRABILIS (A)  Final    Report Status 09/30/2019 FINAL  Final   Organism ID, Bacteria PROTEUS MIRABILIS (A)  Final      Susceptibility   Proteus mirabilis - MIC*    AMPICILLIN <=2 SENSITIVE Sensitive     CEFAZOLIN <=4 SENSITIVE Sensitive     CEFTRIAXONE <=1 SENSITIVE Sensitive     CIPROFLOXACIN <=0.25 SENSITIVE Sensitive     GENTAMICIN <=1 SENSITIVE Sensitive     IMIPENEM 1 SENSITIVE Sensitive     NITROFURANTOIN 128 RESISTANT Resistant     TRIMETH/SULFA <=20 SENSITIVE Sensitive     AMPICILLIN/SULBACTAM <=2 SENSITIVE Sensitive     PIP/TAZO <=4 SENSITIVE Sensitive     * >=100,000 COLONIES/mL PROTEUS MIRABILIS  SARS CORONAVIRUS 2 (TAT 6-24 HRS) Nasopharyngeal Nasopharyngeal Swab     Status: None   Collection Time: 09/27/19  3:20 PM   Specimen: Nasopharyngeal Swab  Result Value Ref Range Status   SARS Coronavirus 2 NEGATIVE NEGATIVE Final    Comment: (NOTE) SARS-CoV-2 target nucleic acids are NOT DETECTED. The SARS-CoV-2 RNA is generally detectable in upper and lower respiratory specimens during the acute phase of infection. Negative results do not preclude SARS-CoV-2 infection, do not rule out co-infections with other pathogens, and should not be used as the sole basis for treatment or other patient management decisions. Negative results must be combined with clinical observations, patient history, and epidemiological information. The expected result is Negative. Fact Sheet for Patients: SugarRoll.be Fact Sheet for Healthcare Providers: https://www.woods-mathews.com/ This test is not yet approved or cleared by the [place] FDA and  has been authorized for detection and/or diagnosis of SARS-CoV-2 by FDA under an Emergency Use Authorization (EUA). This EUA will remain  in effect (meaning this test can be used) for the duration of the COVID-19 declaration under Section 56 4(b)(1) of the Act, 21 U.S.C. section 360bbb-3(b)(1), unless the authorization is  terminated or revoked sooner. Performed at [facility], [address], [place],  [address]     Radiology Reports Ct Abdomen Pelvis W Contrast  Result Date: 09/27/2019 CLINICAL DATA:  Left lower quadrant abdominal pain for several days. Lymphoma on chemotherapy. EXAM: CT ABDOMEN AND PELVIS WITH CONTRAST TECHNIQUE: Multidetector CT imaging of the abdomen and pelvis was performed using the standard protocol following bolus administration of intravenous contrast. CONTRAST:  120mL OMNIPAQUE IOHEXOL 300 MG/ML  SOLN COMPARISON:  08/07/2019 PET-CT.  07/24/2019 CT abdomen/pelvis. FINDINGS: Lower chest: No significant pulmonary nodules or acute consolidative airspace disease. Coronary atherosclerosis. Hepatobiliary: Normal liver size. No liver mass. Mild layering sludge in the gallbladder. No gallbladder wall thickening. No radiopaque cholelithiasis. No pericholecystic fluid. No biliary ductal dilatation. Pancreas: Normal, with no mass or duct dilation. Spleen: Normal size. No mass. Adrenals/Urinary Tract: Normal adrenals. Nonobstructing 2 mm upper and interpolar left renal stones. No hydronephrosis. Simple exophytic 1.7 cm posterior interpolar left renal cyst. Stable subcentimeter hypodense renal cortical lesions in right kidney, too small to characterize. Bladder collapsed by indwelling Foley catheter. Scattered minimal nondependent bladder gas is expected given instrumentation. Diffuse bladder wall thickening. Multiple layering bladder stones, largest 7 mm, similar. Stomach/Bowel: Normal non-distended stomach. Normal caliber small bowel with no small bowel wall thickening. Normal appendix. Large right colonic stool volume. New mild circumferential rectal wall thickening. New focal pericolonic fat stranding in the proximal sigmoid colon with central fat density suggestive acute epiploic appendagitis (series 2/image 59). New mild circumferential wall thickening in the rectum with increased presacral  space edema. Vascular/Lymphatic: Atherosclerotic nonaneurysmal abdominal aorta. Patent portal, splenic, hepatic and renal veins. Previously visualized extensive retroperitoneal and left pelvic adenopathy has substantially decreased. Representative short axis diameter 2.1 cm left para-aortic node (series 2/image 39), previously 4.3 cm on 07/24/2019 CT using similar measurement technique. Representative 1.0 cm left common iliac node (series 2/image 64), previously 2.9 cm. Representative 1.4 cm left external iliac node (series 2/image 78), previously 3.6 cm. No new abdominopelvic adenopathy. There is increased fat stranding throughout sites of adenopathy in the retroperitoneum and left pelvis. Reproductive: Moderately enlarged prostate. Other: No pneumoperitoneum, ascites or focal fluid collection. Musculoskeletal: No aggressive appearing focal osseous lesions. Mild thoracolumbar spondylosis. IMPRESSION: 1. Evidence of significant partial treatment response with substantially decreased retroperitoneal and left pelvic adenopathy. 2. Findings suggestive of acute epiploic appendagitis in the proximal sigmoid colon in the left lower quadrant of the abdomen. 3. Nonspecific new mild circumferential rectal wall thickening, with increased presacral edema. Uncertain if these represent treatment effects or nonspecific proctitis. 4. Large right colonic stool volume suggests constipation. No bowel obstruction. No free air. 5. Diffuse bladder wall thickening, nonspecific, favor chronic given moderate prostatomegaly. Suggest correlation with urinalysis. Bladder decompressed by Foley catheter. Several layering bladder stones. 6. Nonobstructing left nephrolithiasis. 7.  Aortic Atherosclerosis (ICD10-I70.0). Electronically Signed   By: Ilona Sorrel M.D.   On: 09/27/2019 13:23     CBC Recent Labs  Lab 09/27/19 0853 09/28/19 0622 09/30/19 0425  WBC 9.4 8.7 6.1  HGB 8.7* 7.6* 7.5*  HCT 27.5* 24.7* 25.1*  PLT 699* 586* 479*   MCV 100.0 101.2* 102.0*  MCH 31.6 31.1 30.5  MCHC 31.6 30.8 29.9*  RDW 17.1* 17.2* 17.4*  LYMPHSABS 0.9  --   --   MONOABS 0.7  --   --   EOSABS 0.0  --   --   BASOSABS 0.1  --   --     Chemistries  Recent Labs  Lab 09/27/19 0853 09/28/19 0622 09/30/19 0425  NA 139 136 141  K 2.7* 3.4* 2.7*  CL 98 101 110  CO2 30 26 23   GLUCOSE 165* 118* 118*  BUN 24* 17 12  CREATININE 1.25* 1.18 1.25*  CALCIUM 10.7* 9.1 9.1  MG 2.1  --   --   AST 20  --   --   ALT 16  --   --   ALKPHOS 106  --   --  BILITOT 0.3  --   --    ------------------------------------------------------------------------------------------------------------------ No results for input(s): CHOL, HDL, LDLCALC, TRIG, CHOLHDL, LDLDIRECT in the last 72 hours.  Lab Results  Component Value Date   HGBA1C 5.6 07/24/2019   ------------------------------------------------------------------------------------------------------------------ No results for input(s): TSH, T4TOTAL, T3FREE, THYROIDAB in the last 72 hours.  Invalid input(s): FREET3 ------------------------------------------------------------------------------------------------------------------ No results for input(s): VITAMINB12, FOLATE, FERRITIN, TIBC, IRON, RETICCTPCT in the last 72 hours.  Coagulation profile No results for input(s): INR, PROTIME in the last 168 hours.  No results for input(s): DDIMER in the last 72 hours.  Cardiac Enzymes No results for input(s): CKMB, TROPONINI, MYOGLOBIN in the last 168 hours.  Invalid input(s): CK ------------------------------------------------------------------------------------------------------------------ No results found for: BNP   Roxan Hockey M.D on 09/30/2019 at 6:16 PM  Go to [email or web address] - for contact info  [facility] - Office  [phone]

## 2019-09-30 NOTE — Evaluation (Signed)
Physical Therapy Evaluation Patient Details Name: Daniel Richards MRN: [id number] DOB: 1946-02-26 Today's Date: 09/30/2019   History of Present Illness  Daniel Richards  is a 73 y.o. male  history of diabetes, hypertension, recent diagnosis of B-cell lymphoma with chemotherapy and persistent urinary retention with recurrent UTI in the setting of chronic indwelling catheter who presents on 09/27/2019 with significant left-sided abdominal pain consistent with epiploic appendagitis    Clinical Impression  Patient instructed in log rolling technique for sitting up at bedside due to c/o abdominal discomfort/pain with fair carry over, once sitting patient c/o lightheadedness and requested to lie down, supine BP at 142/71, had patient sit up again and sitting BP at 145/95, patient continued to c/o lightheadedness but less than first time sitting up.  Patient declined to attempt sit to stands or transferring to chair due to c/o fatigue and also having bowel movement - nursing staff notified to clean patient.  Patient will benefit from continued physical therapy in hospital and recommended venue below to increase strength, balance, endurance for safe ADLs and gait.    Follow Up Recommendations SNF;Supervision for mobility/OOB;Supervision/Assistance - 24 hour    Equipment Recommendations  None recommended by PT    Recommendations for Other Services       Precautions / Restrictions Precautions Precautions: Fall Restrictions Weight Bearing Restrictions: No      Mobility  Bed Mobility Overal bed mobility: Needs Assistance Bed Mobility: Rolling;Sidelying to Sit Rolling: Min assist Sidelying to sit: Mod assist       General bed mobility comments: slow labored movement had to use bed rail  Transfers                    Ambulation/Gait                Stairs            Wheelchair Mobility    Modified Rankin (Stroke Patients Only)       Balance Overall balance  assessment: Needs assistance Sitting-balance support: Feet supported;No upper extremity supported Sitting balance-Leahy Scale: Poor Sitting balance - Comments: fair/poor with frequent leaning to the right to rest on his elbow                                     Pertinent Vitals/Pain Pain Assessment: 0-10 Pain Score: 6  Pain Location: abdomen Pain Descriptors / Indicators: Aching;Sore Pain Intervention(s): Limited activity within patient's tolerance;Monitored during session;Premedicated before session    Home Living Family/patient expects to be discharged to:: Private residence Living Arrangements: Children Available Help at Discharge: Family;Available 24 hours/day Type of Home: Mobile home Home Access: Ramped entrance     Home Layout: One level Home Equipment: [place] - single point;Walker - 2 wheels;Wheelchair - manual;Hospital bed      Prior Function Level of Independence: Needs assistance   Gait / Transfers Assistance Needed: non-ambulatory, bed bound most of time, occasionally assisted transfers to chair with family assisting, uses depends or cleaned up in bed "per patient"  ADL's / Homemaking Assistance Needed: assisted by family        Hand Dominance   Dominant Hand: Right    Extremity/Trunk Assessment   Upper Extremity Assessment Upper Extremity Assessment: Generalized weakness    Lower Extremity Assessment Lower Extremity Assessment: Generalized weakness    Cervical / Trunk Assessment Cervical / Trunk Assessment: Kyphotic  Communication   Communication: No difficulties  Cognition Arousal/Alertness: Awake/alert Behavior During Therapy: WFL for tasks assessed/performed Overall Cognitive Status: Within Functional Limits for tasks assessed                                        General Comments      Exercises     Assessment/Plan    PT Assessment Patient needs continued PT services  PT Problem List Decreased  strength;Decreased activity tolerance;Decreased balance;Decreased mobility       PT Treatment Interventions Functional mobility training;Therapeutic activities;Therapeutic exercise;Patient/family education;Wheelchair mobility training    PT Goals (Current goals can be found in the Care Plan section)  Acute Rehab PT Goals Patient Stated Goal: return home with family to assist PT Goal Formulation: With patient Time For Goal Achievement: 09/30/19 Potential to Achieve Goals: Good    Frequency Min 3X/week   Barriers to discharge        Co-evaluation               AM-PAC PT "6 Clicks" Mobility  Outcome Measure Help needed turning from your back to your side while in a flat bed without using bedrails?: A Lot Help needed moving from lying on your back to sitting on the side of a flat bed without using bedrails?: A Lot Help needed moving to and from a bed to a chair (including a wheelchair)?: Total Help needed standing up from a chair using your arms (e.g., wheelchair or bedside chair)?: Total Help needed to walk in hospital room?: Total Help needed climbing 3-5 steps with a railing? : Total 6 Click Score: 8    End of Session   Activity Tolerance: Patient tolerated treatment well;Patient limited by fatigue Patient left: in bed;with call bell/phone within reach Nurse Communication: Mobility status PT Visit Diagnosis: Unsteadiness on feet (R26.81);Other abnormalities of gait and mobility (R26.89);Muscle weakness (generalized) (M62.81)    Time: [id number] PT Time Calculation (min) (ACUTE ONLY): 31 min   Charges:   PT Evaluation $PT Eval Moderate Complexity: 1 Mod PT Treatments $Therapeutic Activity: 23-37 mins        2:14 PM, 09/30/19 Lonell Grandchild, MPT Physical Therapist with [facility] 336 [phone] office [phone] mobile phone

## 2019-09-30 NOTE — Progress Notes (Signed)
CRITICAL VALUE STICKER  CRITICAL VALUE: K+ 2.7  RECEIVER (on-site recipient of call): Era Skeen, RN  [place] NOTIFIED: 09/30/2019 @0712   MESSENGER (representative from lab):  MD NOTIFIED: Dr. [name]  TIME OF NOTIFICATION: 0715  RESPONSE: awaiting orders

## 2019-09-30 NOTE — Plan of Care (Signed)
  Problem: Acute Rehab PT Goals(only PT should resolve) Goal: Pt Will Go Supine/Side To Sit Outcome: Progressing Flowsheets (Taken 09/30/2019 1418) Pt will go Supine/Side to Sit: with minimal assist   Problem: Acute Rehab PT Goals(only PT should resolve) Goal: Patient Will Transfer Sit To/From Stand Outcome: Progressing Flowsheets (Taken 09/30/2019 1418) Patient will transfer sit to/from stand: with moderate assist   Problem: Acute Rehab PT Goals(only PT should resolve) Goal: Pt Will Transfer Bed To Chair/Chair To Bed Outcome: Progressing Flowsheets (Taken 09/30/2019 1418) Pt will Transfer Bed to Chair/Chair to Bed: with mod assist   2:19 PM, 09/30/19 Lonell Grandchild, MPT Physical Therapist with [facility] 336 [phone] office [phone] mobile phone

## 2019-09-30 NOTE — TOC Progression Note (Signed)
Transition of Care [facility]) - Progression Note    Patient Details  Name: Daniel Richards MRN: [id number] Date of Birth: 04-Dec-1945  Transition of Care [facility]) CM/SW Contact  Boneta Lucks, RN Phone Number: 09/30/2019, 2:52 PM  Clinical Narrative:   Patient admitted for epiploic appendagitis. Patient has a high risk for readmission. Patient from home with daughter.  Active with [facility], RN. Uses wheelchair, needs family's assistance to transfer in and out of wheelchair. Has chronic foley. He does not have any issues getting to appointments nor getting medications.  PT is recommending SNF, patient is wanting to go home with Home Health. TOC to follow til discharge as needs may change.    Expected Discharge Plan: Home/Self Care Barriers to Discharge: Continued Medical Work up  Expected Discharge Plan and Services Expected Discharge Plan: Home/Self Care     Readmission Risk Interventions Readmission Risk Prevention Plan 09/30/2019 07/30/2019 07/28/2019  Post Dischage Appt - Not Complete -  Appt Comments - [facility] MD will follow while pt at short term rehab -  Medication Screening - - Complete  Transportation Screening Complete - Complete  Medication Review Press photographer) Complete - -  PCP or Specialist appointment within 3-5 days of discharge Not Complete - -  HRI or Home Care Consult Complete - -  SW Recovery Care/Counseling Consult Complete - -  Palliative Care Screening Not Complete - -  [place] Not Complete - -  Some recent data might be hidden

## 2019-10-01 ENCOUNTER — Other Ambulatory Visit: Payer: Self-pay | Admitting: Adult Health

## 2019-10-01 LAB — MAGNESIUM: Magnesium: 2 mg/dL (ref 1.7–2.4)

## 2019-10-01 LAB — BASIC METABOLIC PANEL
Anion gap: 9 (ref 5–15)
BUN: 11 mg/dL (ref 8–23)
CO2: 24 mmol/L (ref 22–32)
Calcium: 8.9 mg/dL (ref 8.9–10.3)
Chloride: 112 mmol/L — ABNORMAL HIGH (ref 98–111)
Creatinine, Ser: 1.26 mg/dL — ABNORMAL HIGH (ref 0.61–1.24)
GFR calc Af Amer: >60 mL/min (ref >60)
GFR calc non Af Amer: 55 mL/min — ABNORMAL LOW (ref >60)
Glucose, Bld: 102 mg/dL — ABNORMAL HIGH (ref 70–99)
Potassium: 3.3 mmol/L — ABNORMAL LOW (ref 3.5–5.1)
Sodium: 145 mmol/L (ref 135–145)

## 2019-10-01 LAB — GLUCOSE, CAPILLARY
Glucose-Capillary: 84 mg/dL (ref 70–99)
Glucose-Capillary: 91 mg/dL (ref 70–99)

## 2019-10-01 MED ORDER — POTASSIUM CHLORIDE CRYS ER 20 MEQ PO TBCR
40.0000 meq | EXTENDED_RELEASE_TABLET | ORAL | Status: AC
  Administered 2019-10-01 (×2): 40 meq via ORAL
  Filled 2019-10-01 (×2): qty 2

## 2019-10-01 MED ORDER — CEPHALEXIN 250 MG PO CAPS
250.0000 mg | ORAL_CAPSULE | Freq: Three times a day (TID) | ORAL | Status: AC
  Administered 2019-10-01 – 2019-10-03 (×7): 250 mg via ORAL
  Filled 2019-10-01 (×7): qty 1

## 2019-10-01 MED ORDER — SODIUM CHLORIDE 0.9 % IV SOLN
1.0000 g | Freq: Once | INTRAVENOUS | Status: AC
  Administered 2019-10-01: 1 g via INTRAVENOUS
  Filled 2019-10-01: qty 10

## 2019-10-01 MED ORDER — CEPHALEXIN 500 MG PO CAPS
500.0000 mg | ORAL_CAPSULE | Freq: Once | ORAL | Status: AC
  Administered 2019-10-01: 500 mg via ORAL
  Filled 2019-10-01: qty 1

## 2019-10-01 NOTE — NC FL2 (Signed)
[place] LEVEL OF CARE SCREENING TOOL     IDENTIFICATION  Patient Name: Daniel Richards Birthdate: 1946-03-25 Sex: male Admission Date (Current Location): 09/27/2019  [facility] and [place] Number:  [employer] and Address:  [facility] [address] [place]      Provider Number: [id number]  Attending Physician Name and Address:  Roxan Hockey, MD  Relative Name and Phone Number:  [name]  [phone]    Current Level of Care: Hospital Recommended Level of Care: [place] Prior Approval Number:    Date Approved/Denied:   PASRR Number: [id number]  Discharge Plan: SNF    Current Diagnoses: Patient Active Problem List   Diagnosis Date Noted  . Epiploic appendagitis 09/27/2019  . Acute saddle pulmonary embolism with acute cor pulmonale (HCC)   . Urinary tract infection associated with indwelling urethral catheter ([place])   . Febrile neutropenia ([place]) 08/26/2019  . PE (pulmonary thromboembolism) ([place]) 08/26/2019  . Neutropenia, drug-induced ([place]) 08/20/2019  . Diffuse large B cell lymphoma ([place]) 08/20/2019  . Port-A-Cath in place 08/13/2019  . Anemia of chronic disease 08/07/2019  . Major depression, recurrent, chronic ([place]) 08/07/2019  . BPH without urinary obstruction 08/07/2019  . Aortic atherosclerosis ([place]) 08/07/2019  . Diabetic peripheral neuropathy ([place]) 08/07/2019  . Hypertension associated with type 2 diabetes mellitus ([place]) 08/07/2019  . 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 Odynophagia   . Acute kidney injury ([place])   . Falls frequently   . Generalized abdominal pain   . Goals of care, counseling/discussion   . Palliative care by specialist   . Folate deficiency   . Generalized weakness 07/25/2019  . Intra-abdominal lymphadenopathy 07/25/2019  . Retroperitoneal lymphadenopathy 07/25/2019  . Malnutrition of  moderate degree 07/25/2019  . Tremor   . Hypercalcemia 07/24/2019  . B12 deficiency 11/18/2016  . Weakness generalized 11/16/2016  . Altered mental status, unspecified 11/16/2016  . Fracture, rib 11/16/2016  . Weakness 11/16/2016  . New onset of headaches after age 54 09/01/2015  . Morning headache 09/01/2015  . Daytime somnolence 09/01/2015  . Essential tremor 09/01/2015  . Intractable headache 09/01/2015  . Type 2 diabetes, controlled, with peripheral neuropathy ([place]) 06/09/2011    Orientation RESPIRATION BLADDER Height & Weight     Self, Time, Situation, Place  Normal Incontinent Weight: 81.5 kg Height:  5\' 11"  (180.3 cm)  BEHAVIORAL SYMPTOMS/MOOD NEUROLOGICAL BOWEL NUTRITION STATUS      Incontinent Diet(see discharge summary)  AMBULATORY STATUS COMMUNICATION OF NEEDS Skin   Extensive Assist Verbally Normal                       Personal Care Assistance Level of Assistance  Bathing, Feeding, Dressing Bathing Assistance: Maximum assistance Feeding assistance: Limited assistance Dressing Assistance: Maximum assistance     Functional Limitations Info  Sight, Hearing, Speech Sight Info: Impaired Hearing Info: Adequate Speech Info: Adequate    SPECIAL CARE FACTORS FREQUENCY  PT (By licensed PT)     PT Frequency: 5 times a week              Contractures Contractures Info: Not present    Additional Factors Info  Code Status, Allergies Code Status Info: Full Allergies Info: Vancomycin           Current Medications (10/01/2019):  This is the current hospital active medication list Current Facility-Administered Medications  Medication Dose Route Frequency Provider  Last Rate Last Dose  . 0.9 %  sodium chloride infusion   Intravenous Continuous Roxan Hockey, MD 30 mL/hr at 09/30/19 1817    . 0.9 %  sodium chloride infusion  250 mL Intravenous PRN Emokpae, Courage, MD      . acetaminophen (TYLENOL) tablet 650 mg  650 mg Oral Q6H PRN Emokpae, Courage,  MD       Or  . acetaminophen (TYLENOL) suppository 650 mg  650 mg Rectal Q6H PRN Emokpae, Courage, MD      . albuterol (PROVENTIL) (2.5 MG/3ML) 0.083% nebulizer solution 2.5 mg  2.5 mg Nebulization Q2H PRN Emokpae, Courage, MD      . allopurinol (ZYLOPRIM) tablet 300 mg  300 mg Oral Daily Emokpae, Courage, MD   300 mg at 10/01/19 0931  . apixaban (ELIQUIS) tablet 5 mg  5 mg Oral BID Roxan Hockey, MD   5 mg at 10/01/19 0931  . bisacodyl (DULCOLAX) suppository 10 mg  10 mg Rectal Once Emokpae, Courage, MD      . calcitRIOL (ROCALTROL) capsule 0.5 mcg  0.5 mcg Oral Daily Emokpae, Courage, MD   0.5 mcg at 10/01/19 0931  . cephALEXin (KEFLEX) capsule 500 mg  500 mg Oral Once Emokpae, Courage, MD      . Chlorhexidine Gluconate Cloth 2 % PADS 6 each  6 each Topical Daily Roxan Hockey, MD   6 each at 10/01/19 1055  . citalopram (CELEXA) tablet 20 mg  20 mg Oral QHS Emokpae, Courage, MD   20 mg at 09/30/19 2040  . folic acid (FOLVITE) tablet 1 mg  1 mg Oral Daily Emokpae, Courage, MD   1 mg at 10/01/19 0931  . gabapentin (NEURONTIN) capsule 600 mg  600 mg Oral TID Roxan Hockey, MD   600 mg at 10/01/19 0931  . lactulose (CHRONULAC) 10 GM/15ML solution 30 g  30 g Oral Once Emokpae, Courage, MD      . morphine 2 MG/ML injection 2 mg  2 mg Intravenous Q4H PRN Roxan Hockey, MD   2 mg at 09/30/19 2040  . ondansetron (ZOFRAN) tablet 4 mg  4 mg Oral Q6H PRN Emokpae, Courage, MD       Or  . ondansetron (ZOFRAN) injection 4 mg  4 mg Intravenous Q6H PRN Emokpae, Courage, MD      . oxyCODONE (Oxy IR/ROXICODONE) immediate release tablet 5 mg  5 mg Oral Q4H PRN [name], Courage, MD   5 mg at 10/01/19 0950  . pantoprazole (PROTONIX) EC tablet 40 mg  40 mg Oral BID AC Emokpae, Courage, MD   40 mg at 10/01/19 0932  . polyethylene glycol (MIRALAX / GLYCOLAX) packet 17 g  17 g Oral BID [name], Courage, MD   17 g at 10/01/19 0931  . primidone (MYSOLINE) tablet 75 mg  75 mg Oral Q8H Emokpae, Courage, MD   75  mg at 10/01/19 0517  . prochlorperazine (COMPAZINE) tablet 10 mg  10 mg Oral Q6H PRN Emokpae, Courage, MD      . sodium chloride flush (NS) 0.9 % injection 3 mL  3 mL Intravenous Q12H Emokpae, Courage, MD   3 mL at 09/30/19 1111  . sodium chloride flush (NS) 0.9 % injection 3 mL  3 mL Intravenous PRN Emokpae, Courage, MD      . sucralfate (CARAFATE) tablet 1 g  1 g Oral QID [name], Courage, MD   1 g at 10/01/19 0931  . temazepam (RESTORIL) capsule 15 mg  15 mg Oral QHS Emokpae,  Courage, MD   15 mg at 09/30/19 2038  . topiramate (TOPAMAX) tablet 200 mg  200 mg Oral QHS Emokpae, Courage, MD   200 mg at 09/30/19 2039  . traZODone (DESYREL) tablet 50 mg  50 mg Oral QHS PRN Roxan Hockey, MD   50 mg at 09/28/19 2105     Discharge Medications: Please see discharge summary for a list of discharge medications.  Relevant Imaging Results:  Relevant Lab Results:   Additional Information ss#  [id number]  Boneta Lucks, RN

## 2019-10-01 NOTE — TOC Progression Note (Signed)
Nursing home [facility] [place], [place] [address] [phone]   [place] My Favorites- [place] Born in a new window 5 out of 5 starsfootnote Much Above Average 5 out of 5 starsfootnote Much Above Average 3 out of 5 starsfootnote Average 3 out of 5 starsfootnote Average 2.9 [place], [place] [address] [phone]   [place] My Favorites- [place] in a new window 2 out of 5 starsfootnote Below Average 2 out of 5 starsfootnote Below Average 2 out of 5 starsfootnote Below Average 2 out of 5 starsfootnote Below Average 3.0 [place] [address] [place], [place] [address] [phone]   Add [facility] My Favorites- Op[place] in a new window 4 out of 5 starsfootnote Above Average 4 out of 5 starsfootnote Above Average 3 out of 5 starsfootnote Average 4 out of 5 starsfootnote Above Average 10.7 [place], [place] [address] [phone]   [facility] My Favorites- Op[place] in a new window 3 out of 5 starsfootnote Average 3 out of 5 starsfootnote Average Not Available80footnote 3 out of 5 starsfootnote Average 12.5 [facility] [place], [place] [address] [phone]   Add [facility] [place] My Favorites- Opens in a new window 1 out of 5 starsfootnote Much Below Average 2 out of 5 starsfootnote Below Average 1 out of 5 starsfootnote Much Below Average 3 out of 5 starsfootnote Average 14.7 [place] [address] [place], [place] [address] [phone]   Add [facility] My Favorites- Opens in a new window 4 out of 5 starsfootnote Above Average 3 out of 5 starsfootnote Average 2 out of 5 starsfootnote Below  Average 5 out of 5 starsfootnote Much Above Average 22.5 [facility] [address] [place], [place] [address] [phone]   Add [place] My Favorites- Opens in a new window 1 out of 5 starsfootnote Much Below Average 1 out of 5 starsfootnote Much Below Average 2 out of 5 starsfootnote Below Average 2 out of 5 starsfootnote Below Average 24.2 [place], [place] [address] [phone]   [place] [facility] My Favorites- [place] Born in a new window 3 out of 5 starsfootnote Average 3 out of 5 starsfootnote Average 2 out of 5 starsfootnote Below Average 2 out of 5 starsfootnote Below Average 24.4 [place] [address] [place], [place] [address] [phone]   [place] My Favorites- Opens in a new window 1 out of 5 starsfootnote Much Below Average 1 out of 5 starsfootnote Much Below Average 2 out of 5 starsfootnote Below Average 2 out of 5 starsfootnote Below Average 25.5 [place] [address] [place], [place] [address] [phone]   [place] My Favorites- Opens in a new window 1 out of 5 starsfootnote Much Below Average 2 out of 5 starsfootnote Below Average 1 out of 5 starsfootnote Much Below Average 3 out of 5 starsfootnote Average 25.9 [place] [address] [place],  [address] [phone]   [place] My Favorites- [place] Born in a new  window 5 out of 5 starsfootnote Much Above Average 5 out of 5 starsfootnote Much Above Average Not Available35footnote 2 out of 5 starsfootnote Below Average 26.1 [facility] [address] [place] [address] [phone]   Add  [facility] My Favorites- Op[place] in a new window 3 out of 5 starsfootnote Average 3 out of 5 starsfootnote Average 2 out of 5 starsfootnote Below Average 3 out of 5 starsfootnote Average 26.7 [place] [address] [place] [address] [phone]   [place] [facility] My Favorites- Opens in a new window 3 out of 5 starsfootnote Average 3 out of 5 starsfootnote Average 2 out of 5 starsfootnote Below Average 4 out of 5 starsfootnote Above Average 27.0 [place] [facility] [place], [place] [address] (336) [place] [facility] My Favorites- Opens in a new window 2 out of 5 starsfootnote Below Average 2 out of 5 starsfootnote Below Average 2 out of 5 starsfootnote Below Average 2 out of 5 starsfootnote Below Average 27.0 [facility] [address] [place] [address] [phone]   [place] My Favorites- Opens in a new window 1 out of 5 starsfootnote Much Below Average 1 out of 5 starsfootnote Much Below Average 3 out of 5 starsfootnote Average 1 out of 5 starsfootnote Much Below Average 27.1 [place] [address] (434) [place] My Favorites- Opens in a new window 3 out of 5 starsfootnote Average 3 out of 5 starsfootnote Average 3 out of 5 starsfootnote Average 4 out of 5 starsfootnote Above Average 27.8 [facility] [place], [place] [address] [phone]   Add [facility] My [place] in a new window 5 out of 5 starsfootnote Much Above Average 5 out of 5 starsfootnote Much Above Average Not Available68footnote 5 out of 5 starsfootnote Much Above  Average 28.0 [place], [place] [address] [phone]   Add [place] My Favorites- Opens in a new window 5 out of 5 starsfootnote Much Above Average 4 out of 5 starsfootnote Above Average Not Available15footnote 5 out of 5 starsfootnote Much Above Average 28.1 [place], [place] [address] [phone]   [place] My Favorites- Opens in a new window 5 out of 5 starsfootnote Much Above Average 4 out of 5 starsfootnote Above Average 5 out of 5 starsfootnote Much Above Average 5 out of 5 starsfootnote Much Above Average 28.2 [place], [place] [address] [phone]   Add [place] My Favorites- Opens in a new window 5 out of 5 starsfootnote Much Above Average 4 out of 5 starsfootnote Above Average 5 out of 5 starsfootnote Much Above Average 5 out of 5 starsfootnote Much Above Average 28.3 [place]

## 2019-10-01 NOTE — Progress Notes (Signed)
Patient Demographics:    Daniel Richards, is a 73 y.o. male, DOB - 03/06/1946, [id number]  Admit date - 09/27/2019   Admitting Physician Daniel Salamone [name], MD  Outpatient Primary MD for the patient is Daniel Sender, FNP  LOS - 4   Chief Complaint  Patient presents with  . 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, unspecified B-cell lymphoma type    Hypertension associated with type 2 diabetes mellitus ([place])   PE (pulmonary thromboembolism) (HCC)   Generalized abdominal pain   Brief Summary:- 73 y.o.malewith a history of diabetes, hypertension, recent diagnosis of B-cell lymphoma with chemotherapy admitted on 09/27/2019 with significant left-sided abdominal pain consistent with epiploic appendagitis -Patient is now agreeable to go to SNF rehab as recommended by physical therapy -Await insurance approval for SNF rehab   A/P 1)Epiploic Appendagitis in the proximal sigmoid area ---  ischemic infarction of an epiploic appendage caused by torsion or spontaneous thrombosis of the epiploic appendage central draining vein----abdominal pain is improving, - continue as needed IV morphine sulfate  for pain control,  -conservative management with pain control as ordered -Overall improving abdominal pain -No indication for antibiotics, surgical intervention not indicated unless fails to resolve with conservative management  2)B-cell  lymphoma---currently receiving chemotherapy from Dr. Delton Richards,  -CT abdomen and pelvis from 09/27/2019 reveals significant partial treatment response to current chemotherapy with substantially decreased retroperitoneal and left pelvic adenopathy  3)Proteus catheter associated/complicated UTI in the setting of BPH with LUTs---recurrent UTIs, chronic indwelling Foley catheter which was last changed on 09/15/2019 -Continue Flomax -Urine culture from 08/26/2019 with  pansensitive Proteus mirabilis and Enterococcus faecalis  -Repeat urine culture this admission with Proteus, -Patient was treated with Levaquin, switched to Rocephin, de-escalate to Keflex on 10/01/2019   4)DM-excellent control, A1c 5.6, -Continue to hold Metformin due to contrast study -Patient has a UTI so STOP Invokana -Use Novolog/Humalog Sliding scale insulin with Accu-Cheks/Fingersticks as ordered   5)Depressive disorder--stable, continue Celexa, as well as Restoril as needed for sleep  6)HTN-okay to continue propranolol due to tremors, but stop amlodipine -Hold lisinopril  7)Generalized weakness/Debility-- -Patient is now agreeable to go to SNF rehab as recommended by physical therapy -Await insurance approval for SNF rehab  8)Constipation--- significant constipation especially on the right side of the colon-  -Improved with laxatives - c/n  laxatives as ordered  9))H/o PE-- Dxed in 08/2019--- c/n Eliquis indefinitely as patient has underlying lymphoma/malignancy  10) essential tremors--- continue propanolol  -Disposition--physical therapist recommends SNF rehab, -Patient is now agreeable to go to SNF rehab as recommended by physical therapy -Await insurance approval for SNF rehab  Disposition- SNF   Code Status:Full  Family Communication: (patient is alert, awake and coherent) -Discussed with patient's son at bedside, questions answered Called and d/w  Pt's daughter (POA) Daniel Richards  Code  Status : Full   Disposition Plan  : Anticipate discharge to SNF  Consults  :  Na  DVT Prophylaxis  : Eliquis- SCDs  Lab Results  Component Value Date   PLT 479 (H) 09/30/2019    Inpatient Medications  Scheduled Meds: . allopurinol  300 mg Oral Daily  . apixaban  5 mg Oral BID  . bisacodyl  10 mg Rectal Once  . calcitRIOL  0.5 mcg Oral Daily  . Chlorhexidine Gluconate Cloth  6 each Topical Daily  . citalopram  20 mg Oral QHS  . folic acid  1 mg Oral Daily  . gabapentin  600 mg Oral TID  . lactulose  30 g Oral Once  . pantoprazole  40 mg Oral BID AC  . polyethylene glycol  17 g Oral BID  . primidone  75 mg Oral Q8H  . sodium chloride flush  3 mL Intravenous Q12H  . sucralfate  1 g Oral QID  . temazepam  15 mg Oral QHS  . topiramate  200 mg Oral QHS   Continuous Infusions: . sodium chloride 30 mL/hr at 09/30/19 1817  . sodium chloride     PRN Meds:.sodium chloride, acetaminophen **OR** acetaminophen, albuterol, ondansetron **OR** ondansetron (ZOFRAN) IV, oxyCODONE, prochlorperazine, sodium chloride flush, traZODone    Anti-infectives (From admission, onward)   Start     Dose/Rate Route Frequency Ordered Stop   10/01/19 1230  cephALEXin (KEFLEX) capsule 500 mg     500 mg Oral  Once 10/01/19 1229 10/01/19 1438   10/01/19 1100  cefTRIAXone (ROCEPHIN) 1 g in sodium chloride 0.9 % 100 mL IVPB     1 g 200 mL/hr over 30 Minutes Intravenous  Once 10/01/19 1014 10/01/19 1208   09/29/19 1800  cefTRIAXone (ROCEPHIN) 1 g in sodium chloride 0.9 % 100 mL IVPB  Status:  Discontinued     1 g 200 mL/hr over 30 Minutes Intravenous Every 24 hours 09/29/19 1406 10/01/19 1014   09/27/19 2200  cefTRIAXone (ROCEPHIN) 1 g in sodium chloride 0.9 % 100 mL IVPB  Status:  Discontinued     1 g 200 mL/hr over 30 Minutes Intravenous Every 24 hours 09/27/19 1355 09/27/19 1404   09/27/19 1800  levofloxacin (LEVAQUIN) IVPB 750 mg  Status:  Discontinued     750 mg 100 mL/hr over 90 Minutes  Intravenous Every 24 hours 09/27/19 1654 09/29/19 1406   09/27/19 1500  fluconazole (DIFLUCAN) IVPB 200 mg     200 mg 100 mL/hr over 60 Minutes Intravenous Daily 09/27/19 1357 09/29/19 1732   09/27/19 1500  cefTRIAXone (ROCEPHIN) 2 g in sodium chloride 0.9 % 100 mL IVPB  Status:  Discontinued     2 g 200 mL/hr over 30 Minutes Intravenous Every 24 hours 09/27/19 1404 09/27/19 1633   09/27/19 1400  cefTRIAXone (ROCEPHIN) 1 g in sodium chloride 0.9 % 100 mL IVPB  Status:  Discontinued     1 g 200 mL/hr over 30 Minutes Intravenous  Once 09/27/19 1348 09/27/19 1404        Objective:   Vitals:   09/30/19 0602 09/30/19 2105 09/30/19 2105 10/01/19 0513  BP: (!) 146/74 134/69 134/69 125/72  Pulse: 96 81 82 82  Resp: 20 18 18 18   Temp: 98.1 F (36.7 C) 98 F (36.7 C) 98 F (36.7 C) 98.2 F (36.8 C)  TempSrc: Oral Oral Oral Oral  SpO2: 92% 96% 97% 96%  Weight:  Height:        Wt Readings from Last 3 Encounters:  09/27/19 81.5 kg  09/15/19 86.2 kg  09/10/19 77 kg     Intake/Output Summary (Last 24 hours) at 10/01/2019 1848 Last data filed at 10/01/2019 1817 Gross per 24 hour  Intake 1450.92 ml  Output 1650 ml  Net -199.08 ml     Physical Exam  Gen:- Awake Alert,  In no apparent distress  HEENT:- [place].AT, No sclera icterus Neck-Supple Neck,No JVD,.  Lungs-  CTAB , fair symmetrical air movement CV- S1, S2 normal, regular, left subclavian area Port-A-Cath in situ clean Abd-  +ve B.Sounds, Abd Soft, much less distended and less tender Extremity/Skin:- No  edema, pedal pulses present  Psych-affect is appropriate, oriented x3 Neuro-generalized weakness, no new focal deficits, essential tremors GU- indwelling foley in situ   Data Review:   Micro Results Recent Results (from the past 240 hour(s))  Urine culture     Status: Abnormal   Collection Time: 09/27/19  1:49 PM   Specimen: Urine, Clean Catch  Result Value Ref Range Status   Specimen Description   Final     URINE, CLEAN CATCH Performed at [facility], [address], [place], [place] [address]    Special Requests   Final    NONE Performed at [facility], [address] [place], [place] [address]    Culture >=100,000 COLONIES/mL PROTEUS MIRABILIS (A)  Final   Report Status 09/30/2019 FINAL  Final   Organism ID, Bacteria PROTEUS MIRABILIS (A)  Final      Susceptibility   Proteus mirabilis - MIC*    AMPICILLIN <=2 SENSITIVE Sensitive     CEFAZOLIN <=4 SENSITIVE Sensitive     CEFTRIAXONE <=1 SENSITIVE Sensitive     CIPROFLOXACIN <=0.25 SENSITIVE Sensitive     GENTAMICIN <=1 SENSITIVE Sensitive     IMIPENEM 1 SENSITIVE Sensitive     NITROFURANTOIN 128 RESISTANT Resistant     TRIMETH/SULFA <=20 SENSITIVE Sensitive     AMPICILLIN/SULBACTAM <=2 SENSITIVE Sensitive     PIP/TAZO <=4 SENSITIVE Sensitive     * >=100,000 COLONIES/mL PROTEUS MIRABILIS  SARS CORONAVIRUS 2 (TAT 6-24 HRS) Nasopharyngeal Nasopharyngeal Swab     Status: None   Collection Time: 09/27/19  3:20 PM   Specimen: Nasopharyngeal Swab  Result Value Ref Range Status   SARS Coronavirus 2 NEGATIVE NEGATIVE Final    Comment: (NOTE) SARS-CoV-2 target nucleic acids are NOT DETECTED. The SARS-CoV-2 RNA is generally detectable in upper and lower respiratory specimens during the acute phase of infection. Negative results do not preclude SARS-CoV-2 infection, do not rule out co-infections with other pathogens, and should not be used as the sole basis for treatment or other patient management decisions. Negative results must be combined with clinical observations, patient history, and epidemiological information. The expected result is Negative. Fact Sheet for Patients: SugarRoll.be Fact Sheet for Healthcare Providers: https://www.woods-mathews.com/ This test is not yet approved or cleared by the [place] FDA and  has been authorized for detection and/or diagnosis of SARS-CoV-2 by FDA  under an Emergency Use Authorization (EUA). This EUA will remain  in effect (meaning this test can be used) for the duration of the COVID-19 declaration under Section 56 4(b)(1) of the Act, 21 U.S.C. section 360bbb-3(b)(1), unless the authorization is terminated or revoked sooner. Performed at [facility], [place] [address], [place],  [address]     Radiology Reports Ct Abdomen Pelvis W Contrast  Result Date: 09/27/2019 CLINICAL DATA:  Left lower quadrant abdominal pain for several days. Lymphoma on chemotherapy. EXAM: CT ABDOMEN AND PELVIS WITH CONTRAST TECHNIQUE: Multidetector CT imaging of the abdomen and pelvis was performed using the standard protocol following bolus administration of intravenous contrast. CONTRAST:  132mL OMNIPAQUE IOHEXOL 300 MG/ML  SOLN COMPARISON:  08/07/2019 PET-CT.  07/24/2019 CT abdomen/pelvis. FINDINGS: Lower chest: No significant pulmonary nodules or acute consolidative airspace disease. Coronary atherosclerosis. Hepatobiliary: Normal liver size. No liver mass. Mild layering sludge in the gallbladder. No gallbladder wall thickening. No radiopaque cholelithiasis. No pericholecystic fluid. No biliary ductal dilatation. Pancreas: Normal, with no mass or duct dilation. Spleen: Normal size. No mass. Adrenals/Urinary Tract: Normal adrenals. Nonobstructing 2 mm upper and interpolar left renal stones. No hydronephrosis. Simple exophytic 1.7 cm posterior interpolar left renal cyst. Stable subcentimeter hypodense renal cortical lesions in right kidney, too small to characterize. Bladder collapsed by indwelling Foley catheter. Scattered minimal nondependent bladder gas is expected given instrumentation. Diffuse bladder wall thickening. Multiple layering bladder stones, largest 7 mm, similar. Stomach/Bowel: Normal non-distended stomach. Normal caliber small bowel with no small bowel wall thickening. Normal appendix. Large right colonic stool volume. New mild  circumferential rectal wall thickening. New focal pericolonic fat stranding in the proximal sigmoid colon with central fat density suggestive acute epiploic appendagitis (series 2/image 59). New mild circumferential wall thickening in the rectum with increased presacral space edema. Vascular/Lymphatic: Atherosclerotic nonaneurysmal abdominal aorta. Patent portal, splenic, hepatic and renal veins. Previously visualized extensive retroperitoneal and left pelvic adenopathy has substantially decreased. Representative short axis diameter 2.1 cm left para-aortic node (series 2/image 39), previously 4.3 cm on 07/24/2019 CT using similar measurement technique. Representative 1.0 cm left common iliac node (series 2/image 64), previously 2.9 cm. Representative 1.4 cm left external iliac node (series 2/image 78), previously 3.6 cm. No new abdominopelvic adenopathy. There is increased fat stranding throughout sites of adenopathy in the retroperitoneum and left pelvis. Reproductive: Moderately enlarged prostate. Other: No pneumoperitoneum, ascites or focal fluid collection. Musculoskeletal: No aggressive appearing focal osseous lesions. Mild thoracolumbar spondylosis. IMPRESSION: 1. Evidence of significant partial treatment response with substantially decreased retroperitoneal and left pelvic adenopathy. 2. Findings suggestive of acute epiploic appendagitis in the proximal sigmoid colon in the left lower quadrant of the abdomen. 3. Nonspecific new mild circumferential rectal wall thickening, with increased presacral edema. Uncertain if these represent treatment effects or nonspecific proctitis. 4. Large right colonic stool volume suggests constipation. No bowel obstruction. No free air. 5. Diffuse bladder wall thickening, nonspecific, favor chronic given moderate prostatomegaly. Suggest correlation with urinalysis. Bladder decompressed by Foley catheter. Several layering bladder stones. 6. Nonobstructing left nephrolithiasis. 7.   Aortic Atherosclerosis (ICD10-I70.0). Electronically Signed   By: Ilona Sorrel M.D.   On: 09/27/2019 13:23     CBC Recent Labs  Lab 09/27/19 0853 09/28/19 0622 09/30/19 0425  WBC 9.4 8.7 6.1  HGB 8.7* 7.6* 7.5*  HCT 27.5* 24.7* 25.1*  PLT 699* 586* 479*  MCV 100.0 101.2* 102.0*  MCH 31.6 31.1 30.5  MCHC 31.6 30.8 29.9*  RDW 17.1* 17.2* 17.4*  LYMPHSABS 0.9  --   --   MONOABS 0.7  --   --   EOSABS 0.0  --   --   BASOSABS 0.1  --   --     Chemistries  Recent Labs  Lab 09/27/19 0853 09/28/19 0622 09/30/19 0425 10/01/19 0532  NA 139 136 141 145  K 2.7* 3.4* 2.7* 3.3*  CL 98 101 110 112*  CO2 30 26 23  24  GLUCOSE 165* 118* 118* 102*  BUN 24* 17 12 11   CREATININE 1.25* 1.18 1.25* 1.26*  CALCIUM 10.7* 9.1 9.1 8.9  MG 2.1  --   --  2.0  AST 20  --   --   --   ALT 16  --   --   --   ALKPHOS 106  --   --   --   BILITOT 0.3  --   --   --    ------------------------------------------------------------------------------------------------------------------ No results for input(s): CHOL, HDL, LDLCALC, TRIG, CHOLHDL, LDLDIRECT in the last 72 hours.  Lab Results  Component Value Date   HGBA1C 5.6 07/24/2019   ------------------------------------------------------------------------------------------------------------------ No results for input(s): TSH, T4TOTAL, T3FREE, THYROIDAB in the last 72 hours.  Invalid input(s): FREET3 ------------------------------------------------------------------------------------------------------------------ No results for input(s): VITAMINB12, FOLATE, FERRITIN, TIBC, IRON, RETICCTPCT in the last 72 hours.  Coagulation profile No results for input(s): INR, PROTIME in the last 168 hours.  No results for input(s): DDIMER in the last 72 hours.  Cardiac Enzymes No results for input(s): CKMB, TROPONINI, MYOGLOBIN in the last 168 hours.  Invalid input(s): CK  ------------------------------------------------------------------------------------------------------------------ No results found for: BNP   Roxan Hockey M.D on 10/01/2019 at 6:48 PM  Go to [email or web address] - for contact info  [facility] - Office  [phone]

## 2019-10-01 NOTE — TOC Transition Note (Addendum)
Transition of Care [facility]) - CM/SW Discharge Note   Patient Details  Name: Daniel Richards MRN: [id number] Date of Birth: 1946/04/10  Transition of Care [facility]) CM/SW Contact:  Boneta Lucks, RN Phone Number: 10/01/2019, 10:51 AM    Clinical Narrative:   Patient discharging home today, Patient is active with [facility] for RN, MD will add PT. [name] with [facility] updated.   ADDENDUM:  Family now wanting SNF, FL2 done and sent out to choices given.   Final next level of care: Home/Self Care Barriers to Discharge: Barriers Resolved   Patient Goals and CMS Choice Patient states their goals for this hospitalization and ongoing recovery are:: to go back home with home health. CMS Medicare.gov Compare Post Acute Care list provided to:: Patient Choice offered to / list presented to : Patient  Discharge Placement         Patient and family notified of of transfer: 10/01/19 Readmission Risk Interventions Readmission Risk Prevention Plan 09/30/2019 07/30/2019 07/28/2019  Post Dischage Appt - Not Complete -  Appt Comments - [facility] MD will follow while pt at short term rehab -  Medication Screening - - Complete  Transportation Screening Complete - Complete  Medication Review Press photographer) Complete - -  PCP or Specialist appointment within 3-5 days of discharge Not Complete - -  HRI or Home Care Consult Complete - -  SW Recovery Care/Counseling Consult Complete - -  Palliative Care Screening Not Complete - -  [place] Not Complete - -  Some recent data might be hidden

## 2019-10-01 NOTE — Progress Notes (Signed)
Initial plan for pt to discharge to home today. Daughter called around 50 today and stated that her twin daughters have tested + for Covid and she has now been exposed. States she doesn't feel safe bringing her father home at this time. Requests that her father go to SNF rehab. Reminded daughter that her father has been refusing rehab. She stated that she has talked with her father and explained the situation and he is now agreeable to go for rehab at [facility]. Talked with patient who confirms he is agreeable. MD notified and will speak with pt's daughter.

## 2019-10-02 NOTE — Progress Notes (Signed)
Patient Demographics:    Daniel Richards, is a 73 y.o. male, DOB - [birth year], [id number]  Admit date - 09/27/2019   Admitting Physician Grete Bosko [name], MD  Outpatient Primary MD for the patient is Daniel Sender, FNP  LOS - 5   Chief Complaint  Patient presents with  . 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, unspecified B-cell lymphoma type    Hypertension associated with type 2 diabetes mellitus (HCC)   PE (pulmonary thromboembolism) (HCC)   Generalized abdominal pain   Brief Summary:- 73 y.o.malewith a history of diabetes, hypertension, recent diagnosis of B-cell lymphoma with chemotherapy admitted on 09/27/2019 with significant left-sided abdominal pain consistent with epiploic appendagitis -Patient is now agreeable to go to SNF rehab as recommended by physical therapy -Await insurance approval for SNF rehab   A/P 1)Epiploic Appendagitis in the proximal sigmoid area ---  ischemic infarction of an epiploic appendage caused by torsion or spontaneous thrombosis of the epiploic appendage central draining vein----abdominal pain continues to improve - continue as needed IV morphine sulfate  for pain control,  -conservative management with pain control as ordered -No indication for antibiotics, surgical intervention not indicated unless fails to resolve with conservative management  2)B-cell lymphoma---currently receiving chemotherapy  from Dr. Delton Coombes,  -CT abdomen and pelvis from 09/27/2019 reveals significant partial treatment response to current chemotherapy with substantially decreased retroperitoneal and left pelvic adenopathy  3)Proteus catheter associated/complicated UTI in the setting of BPH with LUTs---recurrent UTIs, chronic indwelling Foley catheter which was last changed on 09/15/2019 -Continue Flomax -Urine culture from 08/26/2019 with  pansensitive Proteus mirabilis and Enterococcus faecalis  -Repeat urine culture this admission with Proteus, -Patient was treated with Levaquin, switched to Rocephin, de-escalated to Keflex on 10/01/2019   4)DM-excellent control, A1c 5.6, -Continue to hold Metformin due to contrast study -Patient has a UTI so STOP Invokana -Use Novolog/Humalog Sliding scale insulin with Accu-Cheks/Fingersticks as ordered   5)Depressive disorder--stable, continue Celexa, as well as Restoril as needed for sleep  6)HTN-okay to continue propranolol due to tremors, but stop amlodipine -Hold lisinopril  7)Generalized weakness/Debility-- -Patient is now agreeable to go to SNF rehab as recommended by physical therapy -Await insurance approval for SNF rehab  8)Constipation--- significant constipation especially on the right side of the colon-  -Improved with laxatives - c/n  laxatives as ordered  9))H/o PE-- Dxed in 08/2019--- c/n Eliquis indefinitely as patient has underlying lymphoma/malignancy  10) essential tremors--- continue propanolol  -Disposition--physical therapist recommends SNF rehab, -Patient is now agreeable to go to SNF rehab as recommended by physical therapy -Await insurance approval for SNF rehab  Disposition- SNF   Code Status:Full  Family Communication: (patient is alert, awake and coherent) -Discussed with patient's son at bedside, questions answered Called and d/w Pt's  daughter (POA) Daniel Richards  Code Status : Full   Disposition Plan  :  Anticipate discharge to SNF  Consults  :  Na  DVT Prophylaxis  : Eliquis- SCDs  Lab Results  Component Value Date   PLT 479 (H) 09/30/2019    Inpatient Medications  Scheduled Meds: . allopurinol  300 mg Oral Daily  . apixaban  5 mg Oral BID  . bisacodyl  10 mg Rectal Once  . calcitRIOL  0.5 mcg Oral Daily  . cephALEXin  250 mg Oral TID  . Chlorhexidine Gluconate Cloth  6 each Topical Daily  . citalopram  20 mg Oral QHS  . folic acid  1 mg Oral Daily  . gabapentin  600 mg Oral TID  . lactulose  30 g Oral Once  . pantoprazole  40 mg Oral BID AC  . polyethylene glycol  17 g Oral BID  . primidone  75 mg Oral Q8H  . sodium chloride flush  3 mL Intravenous Q12H  . sucralfate  1 g Oral QID  . temazepam  15 mg Oral QHS  . topiramate  200 mg Oral QHS   Continuous Infusions: . sodium chloride 30 mL/hr at 09/30/19 1817  . sodium chloride     PRN Meds:.sodium chloride, acetaminophen **OR** acetaminophen, albuterol, ondansetron **OR** ondansetron (ZOFRAN) IV, oxyCODONE, prochlorperazine, sodium chloride flush, traZODone    Anti-infectives (From admission, onward)   Start     Dose/Rate Route Frequency Ordered Stop   10/01/19 1900  cephALEXin (KEFLEX) capsule 250 mg     250 mg Oral 3 times daily 10/01/19 1852     10/01/19 1230  cephALEXin (KEFLEX) capsule 500 mg     500 mg Oral  Once 10/01/19 1229 10/01/19 1438   10/01/19 1100  cefTRIAXone (ROCEPHIN) 1 g in sodium chloride 0.9 % 100 mL IVPB     1 g 200 mL/hr over 30 Minutes Intravenous  Once 10/01/19 1014 10/01/19 1208   09/29/19 1800  cefTRIAXone (ROCEPHIN) 1 g in sodium chloride 0.9 % 100 mL IVPB  Status:  Discontinued     1 g 200 mL/hr over 30 Minutes Intravenous Every 24 hours 09/29/19 1406 10/01/19 1014   09/27/19 2200  cefTRIAXone (ROCEPHIN) 1 g in sodium chloride 0.9 % 100 mL IVPB  Status:  Discontinued     1 g 200 mL/hr over 30 Minutes Intravenous Every 24 hours 09/27/19 1355 09/27/19 1404   09/27/19 1800  levofloxacin  (LEVAQUIN) IVPB 750 mg  Status:  Discontinued     750 mg 100 mL/hr over 90 Minutes Intravenous Every 24 hours 09/27/19 1654 09/29/19 1406   09/27/19 1500  fluconazole (DIFLUCAN) IVPB 200 mg     200 mg 100 mL/hr over 60 Minutes Intravenous Daily 09/27/19 1357 09/29/19 1732   09/27/19 1500  cefTRIAXone (ROCEPHIN) 2 g in sodium chloride 0.9 % 100 mL IVPB  Status:  Discontinued     2 g 200 mL/hr over 30 Minutes Intravenous Every 24 hours 09/27/19 1404 09/27/19 1633   09/27/19 1400  cefTRIAXone (ROCEPHIN) 1 g in sodium chloride 0.9 % 100 mL IVPB  Status:  Discontinued     1 g 200 mL/hr over 30 Minutes Intravenous  Once 09/27/19 1348 09/27/19 1404        Objective:   Vitals:   10/01/19 0513 10/01/19 1937 10/01/19 2143 10/02/19 0532  BP: 125/72  (!) 146/76 (!) 144/75  Pulse: 82  84 78  Resp: 18  20 18   Temp: 98.2 F (  36.8 C)  99.2 F (37.3 C) 98.9 F (37.2 C)  TempSrc: Oral  Oral Oral  SpO2: 96% 96% 96% 97%  Weight:      Height:        Wt Readings from Last 3 Encounters:  09/27/19 81.5 kg  09/15/19 86.2 kg  09/10/19 77 kg     Intake/Output Summary (Last 24 hours) at 10/02/2019 1825 Last data filed at 10/02/2019 1700 Gross per 24 hour  Intake 360 ml  Output 2350 ml  Net -1990 ml     Physical Exam  Gen:- Awake Alert,  In no apparent distress  HEENT:- [place].AT, No sclera icterus Neck-Supple Neck,No JVD,.  Lungs-  CTAB , fair symmetrical air movement CV- S1, S2 normal, regular, left subclavian area Port-A-Cath in situ clean Abd-  +ve B.Sounds, Abd Soft, much less distended and less tender Extremity/Skin:- No  edema, pedal pulses present  Psych-affect is appropriate, oriented x3 Neuro-generalized weakness, no new focal deficits, essential tremors GU- indwelling foley in situ   Data Review:   Micro Results Recent Results (from the past 240 hour(s))  Urine culture     Status: Abnormal   Collection Time: 09/27/19  1:49 PM   Specimen: Urine, Clean Catch  Result Value  Ref Range Status   Specimen Description   Final    URINE, CLEAN CATCH Performed at [facility], [address], [place], [place] [address]    Special Requests   Final    NONE Performed at [facility], [address], [place], [place] [address]    Culture >=100,000 COLONIES/mL PROTEUS MIRABILIS (A)  Final   Report Status 09/30/2019 FINAL  Final   Organism ID, Bacteria PROTEUS MIRABILIS (A)  Final      Susceptibility   Proteus mirabilis - MIC*    AMPICILLIN <=2 SENSITIVE Sensitive     CEFAZOLIN <=4 SENSITIVE Sensitive     CEFTRIAXONE <=1 SENSITIVE Sensitive     CIPROFLOXACIN <=0.25 SENSITIVE Sensitive     GENTAMICIN <=1 SENSITIVE Sensitive     IMIPENEM 1 SENSITIVE Sensitive     NITROFURANTOIN 128 RESISTANT Resistant     TRIMETH/SULFA <=20 SENSITIVE Sensitive     AMPICILLIN/SULBACTAM <=2 SENSITIVE Sensitive     PIP/TAZO <=4 SENSITIVE Sensitive     * >=100,000 COLONIES/mL PROTEUS MIRABILIS  SARS CORONAVIRUS 2 (TAT 6-24 HRS) Nasopharyngeal Nasopharyngeal Swab     Status: None   Collection Time: 09/27/19  3:20 PM   Specimen: Nasopharyngeal Swab  Result Value Ref Range Status   SARS Coronavirus 2 NEGATIVE NEGATIVE Final    Comment: (NOTE) SARS-CoV-2 target nucleic acids are NOT DETECTED. The SARS-CoV-2 RNA is generally detectable in upper and lower respiratory specimens during the acute phase of infection. Negative results do not preclude SARS-CoV-2 infection, do not rule out co-infections with other pathogens, and should not be used as the sole basis for treatment or other patient management decisions. Negative results must be combined with clinical observations, patient history, and epidemiological information. The expected result is Negative. Fact Sheet for Patients: SugarRoll.be Fact Sheet for Healthcare Providers: https://www.woods-mathews.com/ This test is not yet approved or cleared by the [place] FDA and  has been authorized  for detection and/or diagnosis of SARS-CoV-2 by FDA under an Emergency Use Authorization (EUA). This EUA will remain  in effect (meaning this test can be used) for the duration of the COVID-19 declaration under Section 56 4(b)(1) of the Act, 21 U.S.C. section 360bbb-3(b)(1), unless the authorization is terminated or revoked sooner. Performed  at [facility], [place] [address] [place], [place] [address]     Radiology Reports Ct Abdomen Pelvis W Contrast  Result Date: 09/27/2019 CLINICAL DATA:  Left lower quadrant abdominal pain for several days. Lymphoma on chemotherapy. EXAM: CT ABDOMEN AND PELVIS WITH CONTRAST TECHNIQUE: Multidetector CT imaging of the abdomen and pelvis was performed using the standard protocol following bolus administration of intravenous contrast. CONTRAST:  171mL OMNIPAQUE IOHEXOL 300 MG/ML  SOLN COMPARISON:  08/07/2019 PET-CT.  07/24/2019 CT abdomen/pelvis. FINDINGS: Lower chest: No significant pulmonary nodules or acute consolidative airspace disease. Coronary atherosclerosis. Hepatobiliary: Normal liver size. No liver mass. Mild layering sludge in the gallbladder. No gallbladder wall thickening. No radiopaque cholelithiasis. No pericholecystic fluid. No biliary ductal dilatation. Pancreas: Normal, with no mass or duct dilation. Spleen: Normal size. No mass. Adrenals/Urinary Tract: Normal adrenals. Nonobstructing 2 mm upper and interpolar left renal stones. No hydronephrosis. Simple exophytic 1.7 cm posterior interpolar left renal cyst. Stable subcentimeter hypodense renal cortical lesions in right kidney, too small to characterize. Bladder collapsed by indwelling Foley catheter. Scattered minimal nondependent bladder gas is expected given instrumentation. Diffuse bladder wall thickening. Multiple layering bladder stones, largest 7 mm, similar. Stomach/Bowel: Normal non-distended stomach. Normal caliber small bowel with no small bowel wall thickening. Normal appendix.  Large right colonic stool volume. New mild circumferential rectal wall thickening. New focal pericolonic fat stranding in the proximal sigmoid colon with central fat density suggestive acute epiploic appendagitis (series 2/image 59). New mild circumferential wall thickening in the rectum with increased presacral space edema. Vascular/Lymphatic: Atherosclerotic nonaneurysmal abdominal aorta. Patent portal, splenic, hepatic and renal veins. Previously visualized extensive retroperitoneal and left pelvic adenopathy has substantially decreased. Representative short axis diameter 2.1 cm left para-aortic node (series 2/image 39), previously 4.3 cm on 07/24/2019 CT using similar measurement technique. Representative 1.0 cm left common iliac node (series 2/image 64), previously 2.9 cm. Representative 1.4 cm left external iliac node (series 2/image 78), previously 3.6 cm. No new abdominopelvic adenopathy. There is increased fat stranding throughout sites of adenopathy in the retroperitoneum and left pelvis. Reproductive: Moderately enlarged prostate. Other: No pneumoperitoneum, ascites or focal fluid collection. Musculoskeletal: No aggressive appearing focal osseous lesions. Mild thoracolumbar spondylosis. IMPRESSION: 1. Evidence of significant partial treatment response with substantially decreased retroperitoneal and left pelvic adenopathy. 2. Findings suggestive of acute epiploic appendagitis in the proximal sigmoid colon in the left lower quadrant of the abdomen. 3. Nonspecific new mild circumferential rectal wall thickening, with increased presacral edema. Uncertain if these represent treatment effects or nonspecific proctitis. 4. Large right colonic stool volume suggests constipation. No bowel obstruction. No free air. 5. Diffuse bladder wall thickening, nonspecific, favor chronic given moderate prostatomegaly. Suggest correlation with urinalysis. Bladder decompressed by Foley catheter. Several layering bladder stones.  6. Nonobstructing left nephrolithiasis. 7.  Aortic Atherosclerosis (ICD10-I70.0). Electronically Signed   By: Ilona Sorrel M.D.   On: 09/27/2019 13:23     CBC Recent Labs  Lab 09/27/19 0853 09/28/19 0622 09/30/19 0425  WBC 9.4 8.7 6.1  HGB 8.7* 7.6* 7.5*  HCT 27.5* 24.7* 25.1*  PLT 699* 586* 479*  MCV 100.0 101.2* 102.0*  MCH 31.6 31.1 30.5  MCHC 31.6 30.8 29.9*  RDW 17.1* 17.2* 17.4*  LYMPHSABS 0.9  --   --   MONOABS 0.7  --   --   EOSABS 0.0  --   --   BASOSABS 0.1  --   --     Chemistries  Recent Labs  Lab 09/27/19 0853 09/28/19  0622 09/30/19 0425 10/01/19 0532  NA 139 136 141 145  K 2.7* 3.4* 2.7* 3.3*  CL 98 101 110 112*  CO2 30 26 23 24   GLUCOSE 165* 118* 118* 102*  BUN 24* 17 12 11   CREATININE 1.25* 1.18 1.25* 1.26*  CALCIUM 10.7* 9.1 9.1 8.9  MG 2.1  --   --  2.0  AST 20  --   --   --   ALT 16  --   --   --   ALKPHOS 106  --   --   --   BILITOT 0.3  --   --   --    ------------------------------------------------------------------------------------------------------------------ No results for input(s): CHOL, HDL, LDLCALC, TRIG, CHOLHDL, LDLDIRECT in the last 72 hours.  Lab Results  Component Value Date   HGBA1C 5.6 07/24/2019   ------------------------------------------------------------------------------------------------------------------ No results for input(s): TSH, T4TOTAL, T3FREE, THYROIDAB in the last 72 hours.  Invalid input(s): FREET3 ------------------------------------------------------------------------------------------------------------------ No results for input(s): VITAMINB12, FOLATE, FERRITIN, TIBC, IRON, RETICCTPCT in the last 72 hours.  Coagulation profile No results for input(s): INR, PROTIME in the last 168 hours.  No results for input(s): DDIMER in the last 72 hours.  Cardiac Enzymes No results for input(s): CKMB, TROPONINI, MYOGLOBIN in the last 168 hours.  Invalid input(s): CK  ------------------------------------------------------------------------------------------------------------------ No results found for: BNP   Roxan Hockey M.D on 10/02/2019 at 6:25 PM  Go to [email or web address] - for contact info  [facility] - Office  [phone]

## 2019-10-03 LAB — CBC
HCT: 26.2 % — ABNORMAL LOW (ref 39.0–52.0)
Hemoglobin: 8.1 g/dL — ABNORMAL LOW (ref 13.0–17.0)
MCH: 30.7 pg (ref 26.0–34.0)
MCHC: 30.9 g/dL (ref 30.0–36.0)
MCV: 99.2 fL (ref 80.0–100.0)
Platelets: 433 10*3/uL — ABNORMAL HIGH (ref 150–400)
RBC: 2.64 MIL/uL — ABNORMAL LOW (ref 4.22–5.81)
RDW: 17.1 % — ABNORMAL HIGH (ref 11.5–15.5)
WBC: 4.7 10*3/uL (ref 4.0–10.5)
nRBC: 0 % (ref 0.0–0.2)

## 2019-10-03 LAB — BASIC METABOLIC PANEL
Anion gap: 8 (ref 5–15)
BUN: 11 mg/dL (ref 8–23)
CO2: 24 mmol/L (ref 22–32)
Calcium: 9 mg/dL (ref 8.9–10.3)
Chloride: 110 mmol/L (ref 98–111)
Creatinine, Ser: 1.17 mg/dL (ref 0.61–1.24)
GFR calc Af Amer: >60 mL/min (ref >60)
GFR calc non Af Amer: >60 mL/min (ref >60)
Glucose, Bld: 105 mg/dL — ABNORMAL HIGH (ref 70–99)
Potassium: 2.5 mmol/L — CL (ref 3.5–5.1)
Sodium: 142 mmol/L (ref 135–145)

## 2019-10-03 LAB — GLUCOSE, CAPILLARY
Glucose-Capillary: 103 mg/dL — ABNORMAL HIGH (ref 70–99)
Glucose-Capillary: 123 mg/dL — ABNORMAL HIGH (ref 70–99)
Glucose-Capillary: 85 mg/dL (ref 70–99)
Glucose-Capillary: 97 mg/dL (ref 70–99)

## 2019-10-03 MED ORDER — POTASSIUM CHLORIDE CRYS ER 20 MEQ PO TBCR
40.0000 meq | EXTENDED_RELEASE_TABLET | Freq: Once | ORAL | Status: AC
  Administered 2019-10-03: 40 meq via ORAL
  Filled 2019-10-03: qty 2

## 2019-10-03 MED ORDER — POTASSIUM CHLORIDE 10 MEQ/100ML IV SOLN: 10.0000 meq | INTRAVENOUS | Status: AC

## 2019-10-03 MED ORDER — POTASSIUM CHLORIDE CRYS ER 20 MEQ PO TBCR
40.0000 meq | EXTENDED_RELEASE_TABLET | ORAL | Status: AC
  Administered 2019-10-03 (×2): 40 meq via ORAL
  Filled 2019-10-03: qty 2

## 2019-10-03 MED ORDER — SENNOSIDES-DOCUSATE SODIUM 8.6-50 MG PO TABS
2.0000 | ORAL_TABLET | Freq: Every day | ORAL | Status: DC
  Administered 2019-10-05: 2 via ORAL
  Filled 2019-10-03 (×2): qty 2

## 2019-10-03 MED ORDER — PROPRANOLOL HCL 20 MG PO TABS
20.0000 mg | ORAL_TABLET | Freq: Two times a day (BID) | ORAL | Status: DC
  Administered 2019-10-04 – 2019-10-07 (×7): 20 mg via ORAL
  Filled 2019-10-03 (×7): qty 1

## 2019-10-03 NOTE — Progress Notes (Signed)
Physical Therapy Treatment Patient Details Name: Daniel Richards MRN: [id number] DOB: 1945/12/02 Today's Date: 10/03/2019    History of Present Illness Daniel Richards  is a 73 y.o. male  history of diabetes, hypertension, recent diagnosis of B-cell lymphoma with chemotherapy and persistent urinary retention with recurrent UTI in the setting of chronic indwelling catheter who presents on 09/27/2019 with significant left-sided abdominal pain consistent with epiploic appendagitis    PT Comments    Patient demonstrates increased endurance/tolerance for sitting up at bedside and completing BLE exercises, frequently falls backward and to the left side due to trunk weakness, required support at left shoulder to maintain sitting balance when completing exercises, limited for standing with RW for a few seconds before having to sit due to fatigue/weakness.  Patient required Max stand pivot to transfer to chair and tolerated sitting up after therapy - nursing staff notified.  Patient will benefit from continued physical therapy in hospital and recommended venue below to increase strength, balance, endurance for safe ADLs and gait.    Follow Up Recommendations  SNF;Supervision for mobility/OOB;Supervision/Assistance - 24 hour     Equipment Recommendations  None recommended by PT    Recommendations for Other Services       Precautions / Restrictions Precautions Precautions: Fall Restrictions Weight Bearing Restrictions: No    Mobility  Bed Mobility   Bed Mobility: Supine to Sit     Supine to sit: Min assist;Mod assist     General bed mobility comments: slow labored movement  Transfers Overall transfer level: Needs assistance Equipment used: Rolling walker (2 wheeled) Transfers: Sit to/from [facility] Sit to Stand: Mod assist;Max assist Stand pivot transfers: Max assist       General transfer comment: Patient limited to standing with RW for up to 5-10 seconds before  having to sit, required stand pivot to transfer to chair  Ambulation/Gait                 Stairs             Wheelchair Mobility    Modified Rankin (Stroke Patients Only)       Balance Overall balance assessment: Needs assistance Sitting-balance support: Feet supported;No upper extremity supported Sitting balance-Leahy Scale: Poor Sitting balance - Comments: fair/poor with frequent falling backwards and leaning to the left seated at EOB Postural control: Left lateral lean Standing balance support: Bilateral upper extremity supported;During functional activity Standing balance-Leahy Scale: Poor Standing balance comment: using RW                            Cognition Arousal/Alertness: Awake/alert Behavior During Therapy: WFL for tasks assessed/performed Overall Cognitive Status: Within Functional Limits for tasks assessed                                        Exercises General Exercises - Lower Extremity Long Arc Quad: Seated;AROM;Strengthening;Both;10 reps Hip Flexion/Marching: Seated;AROM;Strengthening;Both;10 reps Toe Raises: Seated;AROM;Strengthening;Both;10 reps Heel Raises: Seated;AROM;Strengthening;Both;10 reps    General Comments        Pertinent Vitals/Pain Pain Score: 2  Pain Location: abdomen Pain Descriptors / Indicators: Aching;Sore Pain Intervention(s): Limited activity within patient's tolerance;Monitored during session    Home Living                      Prior Function  PT Goals (current goals can now be found in the care plan section) Acute Rehab PT Goals Patient Stated Goal: return home with family to assist PT Goal Formulation: With patient Time For Goal Achievement: 10/14/19 Potential to Achieve Goals: Good Progress towards PT goals: Progressing toward goals    Frequency    Min 3X/week      PT Plan      Co-evaluation PT/OT/SLP Co-Evaluation/Treatment: Yes             AM-PAC PT "6 Clicks" Mobility   Outcome Measure  Help needed turning from your back to your side while in a flat bed without using bedrails?: A Lot Help needed moving from lying on your back to sitting on the side of a flat bed without using bedrails?: A Lot Help needed moving to and from a bed to a chair (including a wheelchair)?: A Lot Help needed standing up from a chair using your arms (e.g., wheelchair or bedside chair)?: A Lot Help needed to walk in hospital room?: Total Help needed climbing 3-5 steps with a railing? : Total 6 Click Score: 10    End of Session   Activity Tolerance: Patient tolerated treatment well;Patient limited by fatigue Patient left: in chair;with call bell/phone within reach Nurse Communication: Mobility status PT Visit Diagnosis: Unsteadiness on feet (R26.81);Other abnormalities of gait and mobility (R26.89);Muscle weakness (generalized) (M62.81)     Time: [id number] PT Time Calculation (min) (ACUTE ONLY): 30 min  Charges:  $Therapeutic Exercise: 8-22 mins $Therapeutic Activity: 8-22 mins                     2:50 PM, 10/03/19 Daniel Richards, MPT Physical Therapist with [facility] [phone] office [phone] mobile phone

## 2019-10-03 NOTE — Progress Notes (Signed)
Critical Value: K+ 2.5  MD notified

## 2019-10-03 NOTE — TOC Transition Note (Signed)
Transition of Care [facility]) - CM/SW Discharge Note   Patient Details  Name: Daniel Richards MRN: [id number] Date of Birth: [birth year]  [facility]) CM/SW Contact:  Boneta Lucks, RN Phone Number: 10/03/2019, 1:14 PM   Clinical Narrative:   Sent patient out for SNF referrals. No bed offers due to patient being active with chemo and COVID policies.  Spoke with Tammy his daughter. They will take him home.  Patient is active with Home Health. MD will place resumption orders.    Final next level of care: Home/Self Care Barriers to Discharge: Barriers Resolved   Patient Goals and CMS Choice Patient states their goals for this hospitalization and ongoing recovery are:: to go back home with home health. CMS Medicare.gov Compare Post Acute Care list provided to:: Patient Choice offered to / list presented to : Patient  Discharge Placement    Patient and family notified of of transfer: 10/01/19     Readmission Risk Interventions Readmission Risk Prevention Plan 09/30/2019 07/30/2019 07/28/2019  Post Dischage Appt - Not Complete -  Appt Comments - [facility] MD will follow while pt at short term rehab -  Medication Screening - - Complete  Transportation Screening Complete - Complete  Medication Review [employer]) Complete - -  PCP or Specialist appointment within 3-5 days of discharge Not Complete - -  HRI or Home Care Consult Complete - -  SW Recovery Care/Counseling Consult Complete - -  Palliative Care Screening Not Complete - -  Ione Not Complete - -  Some recent data might be hidden

## 2019-10-03 NOTE — Progress Notes (Addendum)
Patient Demographics:    Daniel Richards, is a 73 y.o. male, DOB - 12-30-1945, [id number]  Admit date - 09/27/2019   Admitting Physician Gevorg Brum [name], MD  Outpatient Primary MD for the patient is [name], FNP  LOS - 6   Chief Complaint  Patient presents with  . 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, unspecified B-cell lymphoma type    Hypertension associated with type 2 diabetes mellitus (HCC)   PE (pulmonary thromboembolism) (HCC)   Generalized abdominal pain   Brief Summary:- 73 y.o.malewith a history of diabetes, hypertension, recent diagnosis of B-cell lymphoma with chemotherapy admitted on 09/27/2019 with significant left-sided abdominal pain consistent with epiploic appendagitis -Patient is now agreeable to go to SNF rehab as recommended by physical therapy -Await insurance approval for SNF rehab   A/P 1)Epiploic Appendagitis in the proximal sigmoid area ---  ischemic infarction of an epiploic appendage caused by torsion or spontaneous thrombosis of the epiploic appendage central draining vein----abdominal pain continues to improve - continue as needed IV morphine sulfate  for pain control,  -conservative management with pain control as ordered -No indication for antibiotics, surgical intervention not indicated unless fails to resolve with conservative management  2)B-cell lymphoma---currently receiving chemotherapy from Dr.  Delton Coombes,  -CT abdomen and pelvis from 09/27/2019 reveals significant partial treatment response to current chemotherapy with substantially decreased retroperitoneal and left pelvic adenopathy  3)Proteus catheter associated/complicated UTI in the setting of BPH with LUTs---recurrent UTIs, chronic indwelling Foley catheter which was last changed on 09/15/2019 -Continue Flomax -Urine culture from 08/26/2019 with  pansensitive Proteus mirabilis and Enterococcus faecalis  -Repeat urine culture this admission with Proteus, -Patient was treated with Levaquin, switched to Rocephin, de-escalated to Keflex, completed Keflex =-Foley catheter exchanged on 10/03/2019  4)DM-excellent control, A1c 5.6, -Continue to hold Metformin  -Patient has a UTI so STOP Invokana -Use Novolog/Humalog Sliding scale insulin with Accu-Cheks/Fingersticks as ordered   5)Depressive disorder--stable, continue Celexa, as well as Restoril as needed for sleep  6)HTN-okay to continue propranolol due to tremors, but stop amlodipine -Hold lisinopril  7)Generalized weakness/Debility-- -Patient is now agreeable to go to SNF rehab as recommended by physical therapy -Await insurance approval for SNF rehab  8)Constipation---  on admission patient had significant constipation especially on the right side of the colon-  -Much improved with laxatives - c/n  laxatives as ordered  9))H/o PE-- Dxed in 08/2019--- c/n Eliquis indefinitely as patient has underlying lymphoma/malignancy  10) hypokalemia----potassium is 2.5, replace and recheck, check mag as well  11) essential tremors--- continue propanolol  -Disposition--physical therapist recommends SNF rehab, -Patient is now agreeable to go to SNF rehab as recommended by physical therapy -Await insurance approval for SNF rehab  Disposition- SNF   Procedure Foley catheter exchanged on  10/03/2019  Code Status:Full  Family Communication: (patient is alert, awake  and coherent) -Discussed with patient's son at bedside, questions answered Called and d/w Pt's daughter (POA) [name]  Code Status : Full   Disposition Plan  : Anticipate discharge to SNF  Consults  :  Na  DVT Prophylaxis  : Eliquis- SCDs  Lab Results  Component Value Date   PLT 433 (H) 10/03/2019    Inpatient Medications  Scheduled Meds: . allopurinol  300 mg Oral Daily  . apixaban  5 mg Oral BID  . bisacodyl  10 mg Rectal Once  . calcitRIOL  0.5 mcg Oral Daily  . cephALEXin  250 mg Oral TID  . Chlorhexidine Gluconate Cloth  6 each Topical Daily  . citalopram  20 mg Oral QHS  . folic acid  1 mg Oral Daily  . gabapentin  600 mg Oral TID  . pantoprazole  40 mg Oral BID AC  . potassium chloride  40 mEq Oral Once  . primidone  75 mg Oral Q8H  . [START ON 10/04/2019] senna-docusate  2 tablet Oral QHS  . sodium chloride flush  3 mL Intravenous Q12H  . sucralfate  1 g Oral QID  . temazepam  15 mg Oral QHS  . topiramate  200 mg Oral QHS   Continuous Infusions: . sodium chloride 30 mL/hr at 09/30/19 1817  . sodium chloride     PRN Meds:.sodium chloride, acetaminophen **OR** acetaminophen, albuterol, ondansetron **OR** ondansetron (ZOFRAN) IV, oxyCODONE, prochlorperazine, sodium chloride flush, traZODone    Anti-infectives (From admission, onward)   Start     Dose/Rate Route Frequency Ordered Stop   10/01/19 1900  cephALEXin (KEFLEX) capsule 250 mg     250 mg Oral 3 times daily 10/01/19 1852 10/03/19 2359   10/01/19 1230  cephALEXin (KEFLEX) capsule 500 mg     500 mg Oral  Once 10/01/19 1229 10/01/19 1438   10/01/19 1100  cefTRIAXone (ROCEPHIN) 1 g in sodium chloride 0.9 % 100 mL IVPB     1 g 200 mL/hr over 30 Minutes Intravenous  Once 10/01/19 1014 10/01/19 1208   09/29/19 1800  cefTRIAXone (ROCEPHIN) 1 g in sodium chloride 0.9 % 100 mL IVPB  Status:  Discontinued     1 g 200 mL/hr over 30 Minutes Intravenous Every 24 hours 09/29/19 1406 10/01/19 1014   09/27/19  2200  cefTRIAXone (ROCEPHIN) 1 g in sodium chloride 0.9 % 100 mL IVPB  Status:  Discontinued     1 g 200 mL/hr over 30 Minutes Intravenous Every 24 hours 09/27/19 1355 09/27/19 1404   09/27/19 1800  levofloxacin (LEVAQUIN) IVPB 750 mg  Status:  Discontinued     750 mg 100 mL/hr over 90 Minutes Intravenous Every 24 hours 09/27/19 1654 09/29/19 1406   09/27/19 1500  fluconazole (DIFLUCAN) IVPB 200 mg     200 mg 100 mL/hr over 60 Minutes Intravenous Daily 09/27/19 1357 09/29/19 1732   09/27/19 1500  cefTRIAXone (ROCEPHIN) 2 g in sodium chloride 0.9 % 100 mL IVPB  Status:  Discontinued     2 g 200 mL/hr over 30 Minutes Intravenous Every 24 hours 09/27/19 1404 09/27/19 1633   09/27/19 1400  cefTRIAXone (ROCEPHIN) 1 g in sodium chloride 0.9 % 100 mL IVPB  Status:  Discontinued     1 g 200 mL/hr over 30 Minutes Intravenous  Once 09/27/19 1348 09/27/19 1404        Objective:   Vitals:   10/01/19 2143 10/02/19 0532  10/02/19 2049 10/03/19 0500  BP: (!) 146/76 (!) 144/75 (!) 154/78 140/79  Pulse: 84 78 77 78  Resp: 20 18 18 20   Temp: 99.2 F (37.3 C) 98.9 F (37.2 C) 98.2 F (36.8 C) 98.2 F (36.8 C)  TempSrc: Oral Oral Oral Oral  SpO2: 96% 97% 98% 96%  Weight:      Height:        Wt Readings from Last 3 Encounters:  09/27/19 81.5 kg  09/15/19 86.2 kg  09/10/19 77 kg     Intake/Output Summary (Last 24 hours) at 10/03/2019 2045 Last data filed at 10/03/2019 0500 Gross per 24 hour  Intake -  Output 1050 ml  Net -1050 ml     Physical Exam  Gen:- Awake Alert,  In no apparent distress  HEENT:- [place].AT, No sclera icterus Neck-Supple Neck,No JVD,.  Lungs-  CTAB , fair symmetrical air movement CV- S1, S2 normal, regular, left subclavian area Port-A-Cath in situ clean Abd-  +ve B.Sounds, Abd Soft, much less distended and less tender Extremity/Skin:- No  edema, pedal pulses present  Psych-affect is appropriate, oriented x3 Neuro-generalized weakness, no new focal deficits,  essential tremors GU- indwelling foley in situ   Data Review:   Micro Results Recent Results (from the past 240 hour(s))  Urine culture     Status: Abnormal   Collection Time: 09/27/19  1:49 PM   Specimen: Urine, Clean Catch  Result Value Ref Range Status   Specimen Description   Final    URINE, CLEAN CATCH Performed at [facility], [address], [place], [place] [address]    Special Requests   Final    NONE Performed at [facility], [address], [place], [place] [address]    Culture >=100,000 COLONIES/mL PROTEUS MIRABILIS (A)  Final   Report Status 09/30/2019 FINAL  Final   Organism ID, Bacteria PROTEUS MIRABILIS (A)  Final      Susceptibility   Proteus mirabilis - MIC*    AMPICILLIN <=2 SENSITIVE Sensitive     CEFAZOLIN <=4 SENSITIVE Sensitive     CEFTRIAXONE <=1 SENSITIVE Sensitive     CIPROFLOXACIN <=0.25 SENSITIVE Sensitive     GENTAMICIN <=1 SENSITIVE Sensitive     IMIPENEM 1 SENSITIVE Sensitive     NITROFURANTOIN 128 RESISTANT Resistant     TRIMETH/SULFA <=20 SENSITIVE Sensitive     AMPICILLIN/SULBACTAM <=2 SENSITIVE Sensitive     PIP/TAZO <=4 SENSITIVE Sensitive     * >=100,000 COLONIES/mL PROTEUS MIRABILIS  SARS CORONAVIRUS 2 (TAT 6-24 HRS) Nasopharyngeal Nasopharyngeal Swab     Status: None   Collection Time: 09/27/19  3:20 PM   Specimen: Nasopharyngeal Swab  Result Value Ref Range Status   SARS Coronavirus 2 NEGATIVE NEGATIVE Final    Comment: (NOTE) SARS-CoV-2 target nucleic acids are NOT DETECTED. The SARS-CoV-2 RNA is generally detectable in upper and lower respiratory specimens during the acute phase of infection. Negative results do not preclude SARS-CoV-2 infection, do not rule out co-infections with other pathogens, and should not be used as the sole basis for treatment or other patient management decisions. Negative results must be combined with clinical observations, patient history, and epidemiological information. The expected result is  Negative. Fact Sheet for Patients: SugarRoll.be Fact Sheet for Healthcare Providers: https://www.woods-mathews.com/ This test is not yet approved or cleared by the [place] FDA and  has been authorized for detection and/or diagnosis of SARS-CoV-2 by FDA under an Emergency Use Authorization (EUA). This EUA will remain  in effect (meaning this  test can be used) for the duration of the COVID-19 declaration under Section 56 4(b)(1) of the Act, 21 U.S.C. section 360bbb-3(b)(1), unless the authorization is terminated or revoked sooner. Performed at [facility], [place] [address], [place], [place] [address]     Radiology Reports Ct Abdomen Pelvis W Contrast  Result Date: 09/27/2019 CLINICAL DATA:  Left lower quadrant abdominal pain for several days. Lymphoma on chemotherapy. EXAM: CT ABDOMEN AND PELVIS WITH CONTRAST TECHNIQUE: Multidetector CT imaging of the abdomen and pelvis was performed using the standard protocol following bolus administration of intravenous contrast. CONTRAST:  154mL OMNIPAQUE IOHEXOL 300 MG/ML  SOLN COMPARISON:  08/07/2019 PET-CT.  07/24/2019 CT abdomen/pelvis. FINDINGS: Lower chest: No significant pulmonary nodules or acute consolidative airspace disease. Coronary atherosclerosis. Hepatobiliary: Normal liver size. No liver mass. Mild layering sludge in the gallbladder. No gallbladder wall thickening. No radiopaque cholelithiasis. No pericholecystic fluid. No biliary ductal dilatation. Pancreas: Normal, with no mass or duct dilation. Spleen: Normal size. No mass. Adrenals/Urinary Tract: Normal adrenals. Nonobstructing 2 mm upper and interpolar left renal stones. No hydronephrosis. Simple exophytic 1.7 cm posterior interpolar left renal cyst. Stable subcentimeter hypodense renal cortical lesions in right kidney, too small to characterize. Bladder collapsed by indwelling Foley catheter. Scattered minimal nondependent bladder gas is  expected given instrumentation. Diffuse bladder wall thickening. Multiple layering bladder stones, largest 7 mm, similar. Stomach/Bowel: Normal non-distended stomach. Normal caliber small bowel with no small bowel wall thickening. Normal appendix. Large right colonic stool volume. New mild circumferential rectal wall thickening. New focal pericolonic fat stranding in the proximal sigmoid colon with central fat density suggestive acute epiploic appendagitis (series 2/image 59). New mild circumferential wall thickening in the rectum with increased presacral space edema. Vascular/Lymphatic: Atherosclerotic nonaneurysmal abdominal aorta. Patent portal, splenic, hepatic and renal veins. Previously visualized extensive retroperitoneal and left pelvic adenopathy has substantially decreased. Representative short axis diameter 2.1 cm left para-aortic node (series 2/image 39), previously 4.3 cm on 07/24/2019 CT using similar measurement technique. Representative 1.0 cm left common iliac node (series 2/image 64), previously 2.9 cm. Representative 1.4 cm left external iliac node (series 2/image 78), previously 3.6 cm. No new abdominopelvic adenopathy. There is increased fat stranding throughout sites of adenopathy in the retroperitoneum and left pelvis. Reproductive: Moderately enlarged prostate. Other: No pneumoperitoneum, ascites or focal fluid collection. Musculoskeletal: No aggressive appearing focal osseous lesions. Mild thoracolumbar spondylosis. IMPRESSION: 1. Evidence of significant partial treatment response with substantially decreased retroperitoneal and left pelvic adenopathy. 2. Findings suggestive of acute epiploic appendagitis in the proximal sigmoid colon in the left lower quadrant of the abdomen. 3. Nonspecific new mild circumferential rectal wall thickening, with increased presacral edema. Uncertain if these represent treatment effects or nonspecific proctitis. 4. Large right colonic stool volume suggests  constipation. No bowel obstruction. No free air. 5. Diffuse bladder wall thickening, nonspecific, favor chronic given moderate prostatomegaly. Suggest correlation with urinalysis. Bladder decompressed by Foley catheter. Several layering bladder stones. 6. Nonobstructing left nephrolithiasis. 7.  Aortic Atherosclerosis (ICD10-I70.0). Electronically Signed   By: Ilona Sorrel M.D.   On: 09/27/2019 13:23     CBC Recent Labs  Lab 09/27/19 0853 09/28/19 0622 09/30/19 0425 10/03/19 0526  WBC 9.4 8.7 6.1 4.7  HGB 8.7* 7.6* 7.5* 8.1*  HCT 27.5* 24.7* 25.1* 26.2*  PLT 699* 586* 479* 433*  MCV 100.0 101.2* 102.0* 99.2  MCH 31.6 31.1 30.5 30.7  MCHC 31.6 30.8 29.9* 30.9  RDW 17.1* 17.2* 17.4* 17.1*  LYMPHSABS 0.9  --   --   --  MONOABS 0.7  --   --   --   EOSABS 0.0  --   --   --   BASOSABS 0.1  --   --   --     Chemistries  Recent Labs  Lab 09/27/19 0853 09/28/19 0622 09/30/19 0425 10/01/19 0532 10/03/19 0526  NA 139 136 141 145 142  K 2.7* 3.4* 2.7* 3.3* 2.5*  CL 98 101 110 112* 110  CO2 30 26 23 24 24   GLUCOSE 165* 118* 118* 102* 105*  BUN 24* 17 12 11 11   CREATININE 1.25* 1.18 1.25* 1.26* 1.17  CALCIUM 10.7* 9.1 9.1 8.9 9.0  MG 2.1  --   --  2.0  --   AST 20  --   --   --   --   ALT 16  --   --   --   --   ALKPHOS 106  --   --   --   --   BILITOT 0.3  --   --   --   --    ------------------------------------------------------------------------------------------------------------------ No results for input(s): CHOL, HDL, LDLCALC, TRIG, CHOLHDL, LDLDIRECT in the last 72 hours.  Lab Results  Component Value Date   HGBA1C 5.6 07/24/2019   ------------------------------------------------------------------------------------------------------------------ No results for input(s): TSH, T4TOTAL, T3FREE, THYROIDAB in the last 72 hours.  Invalid input(s): FREET3 ------------------------------------------------------------------------------------------------------------------ No  results for input(s): VITAMINB12, FOLATE, FERRITIN, TIBC, IRON, RETICCTPCT in the last 72 hours.  Coagulation profile No results for input(s): INR, PROTIME in the last 168 hours.  No results for input(s): DDIMER in the last 72 hours.  Cardiac Enzymes No results for input(s): CKMB, TROPONINI, MYOGLOBIN in the last 168 hours.  Invalid input(s): CK ------------------------------------------------------------------------------------------------------------------ No results found for: BNP   Roxan Hockey M.D on 10/03/2019 at 8:45 PM  Go to [email or web address] - for contact info  [facility] - Office  [phone]

## 2019-10-03 NOTE — Care Management Important Message (Signed)
Important Message  Patient Details  Name: Daniel Richards MRN: [id number] Date of Birth: 01-04-1946   Medicare Important Message Given:  Yes     [name] 10/03/2019, 3:39 PM

## 2019-10-04 DIAGNOSIS — E1159 Type 2 diabetes mellitus with other circulatory complications: Secondary | ICD-10-CM

## 2019-10-04 DIAGNOSIS — I10 Essential (primary) hypertension: Secondary | ICD-10-CM

## 2019-10-04 LAB — BASIC METABOLIC PANEL
Anion gap: 5 (ref 5–15)
BUN: 9 mg/dL (ref 8–23)
CO2: 26 mmol/L (ref 22–32)
Calcium: 9.1 mg/dL (ref 8.9–10.3)
Chloride: 111 mmol/L (ref 98–111)
Creatinine, Ser: 1.17 mg/dL (ref 0.61–1.24)
GFR calc Af Amer: >60 mL/min (ref >60)
GFR calc non Af Amer: >60 mL/min (ref >60)
Glucose, Bld: 112 mg/dL — ABNORMAL HIGH (ref 70–99)
Potassium: 2.8 mmol/L — ABNORMAL LOW (ref 3.5–5.1)
Sodium: 142 mmol/L (ref 135–145)

## 2019-10-04 LAB — MAGNESIUM: Magnesium: 2 mg/dL (ref 1.7–2.4)

## 2019-10-04 MED ORDER — POTASSIUM CHLORIDE CRYS ER 20 MEQ PO TBCR
40.0000 meq | EXTENDED_RELEASE_TABLET | ORAL | Status: AC
  Administered 2019-10-04 – 2019-10-05 (×3): 40 meq via ORAL
  Filled 2019-10-04 (×3): qty 2

## 2019-10-04 NOTE — Progress Notes (Signed)
Patient Demographics:    Daniel Richards, is a 73 y.o. male, DOB - 1946/09/16, [id number]  Admit date - 09/27/2019   Admitting Physician [name], MD  Outpatient Primary MD for the patient is [name], FNP  LOS - 7   Chief Complaint  Patient presents with  . Abdominal Pain        Subjective:    [name] abdominal pain improved.  No nausea or vomiting.  Having bowel movements.     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, unspecified B-cell lymphoma type    Hypertension associated with type 2 diabetes mellitus (HCC)   PE (pulmonary thromboembolism) (HCC)   Urinary tract infection associated with indwelling urethral catheter ([place])   Brief Summary:- 73 y.o.malewith a history of diabetes, hypertension, recent diagnosis of B-cell lymphoma with chemotherapy admitted on 09/27/2019 with significant left-sided abdominal pain consistent with epiploic appendagitis -Patient is now agreeable to go to SNF rehab as recommended by physical therapy -Await insurance approval for SNF rehab   A/P 1)Epiploic Appendagitis in the proximal sigmoid area ---  ischemic infarction of an epiploic appendage caused by torsion or spontaneous thrombosis of the epiploic appendage central draining vein----abdominal pain continues to improve -Currently appears to be managed with oxycodone for pain control,  -conservative management with pain control as ordered -No indication for antibiotics, surgical intervention not indicated unless fails to resolve with conservative management  2)B-cell lymphoma---currently receiving chemotherapy from Dr. Delton Coombes,  -CT abdomen and pelvis from 09/27/2019  reveals significant partial treatment response to current chemotherapy with substantially decreased retroperitoneal and left pelvic adenopathy  3)Proteus catheter associated/complicated UTI in the setting of BPH with LUTs---recurrent UTIs, chronic indwelling Foley catheter which was last changed on 09/15/2019 -Continue Flomax -Urine culture from 08/26/2019 with  pansensitive Proteus mirabilis and Enterococcus faecalis  -Repeat urine culture this admission with Proteus, -Patient was treated with Levaquin, switched to Rocephin, de-escalated to Keflex, completed Keflex =-Foley catheter exchanged on 10/03/2019  4)DM-excellent control, A1c 5.6, -Continue to hold Metformin  -Patient has a UTI so STOP Invokana -Use Novolog/Humalog Sliding scale insulin with Accu-Cheks/Fingersticks as ordered   5)Depressive disorder--stable, continue Celexa, as well as Restoril as needed for sleep  6)HTN-okay to continue propranolol due to tremors, but stop amlodipine -Hold lisinopril  7)Generalized weakness/Debility-- -Patient is now agreeable to go to SNF rehab as recommended by physical therapy -Await insurance approval for SNF rehab  8)Constipation---  on admission patient had significant constipation especially on the right side of the colon-  -Much improved with laxatives - c/n  laxatives as ordered  9))H/o PE-- Dxed in 08/2019--- c/n Eliquis indefinitely as patient has underlying lymphoma/malignancy  10) hypokalemia----potassium is 2.8, replace and recheck, magnesium is normal.  11) essential tremors--- continue propanolol and primidone  -Disposition--physical therapist recommends SNF rehab, -Patient is now agreeable to go to SNF rehab as recommended by physical therapy -Await insurance approval for SNF rehab  Disposition- SNF   Procedure Foley catheter exchanged on 10/03/2019  Code Status:Full  Family Communication: (patient is alert, awake and coherent) -  Discussed with  patient's son at bedside, questions answered Called and d/w Pt's daughter (POA) [name]  Code Status : Full   Disposition Plan  : Anticipate discharge to SNF  Consults  :  Na  DVT Prophylaxis  : Eliquis- SCDs  Lab Results  Component Value Date   PLT 433 (H) 10/03/2019    Inpatient Medications  Scheduled Meds: . allopurinol  300 mg Oral Daily  . apixaban  5 mg Oral BID  . bisacodyl  10 mg Rectal Once  . calcitRIOL  0.5 mcg Oral Daily  . Chlorhexidine Gluconate Cloth  6 each Topical Daily  . citalopram  20 mg Oral QHS  . folic acid  1 mg Oral Daily  . gabapentin  600 mg Oral TID  . pantoprazole  40 mg Oral BID AC  . primidone  75 mg Oral Q8H  . propranolol  20 mg Oral BID  . senna-docusate  2 tablet Oral QHS  . sodium chloride flush  3 mL Intravenous Q12H  . sucralfate  1 g Oral QID  . temazepam  15 mg Oral QHS  . topiramate  200 mg Oral QHS   Continuous Infusions: . sodium chloride 20 mL/hr at 10/04/19 0046  . sodium chloride     PRN Meds:.sodium chloride, acetaminophen **OR** acetaminophen, albuterol, ondansetron **OR** ondansetron (ZOFRAN) IV, oxyCODONE, prochlorperazine, sodium chloride flush, traZODone    Anti-infectives (From admission, onward)   Start     Dose/Rate Route Frequency Ordered Stop   10/01/19 1900  cephALEXin (KEFLEX) capsule 250 mg     250 mg Oral 3 times daily 10/01/19 1852 10/03/19 2322   10/01/19 1230  cephALEXin (KEFLEX) capsule 500 mg     500 mg Oral  Once 10/01/19 1229 10/01/19 1438   10/01/19 1100  cefTRIAXone (ROCEPHIN) 1 g in sodium chloride 0.9 % 100 mL IVPB     1 g 200 mL/hr over 30 Minutes Intravenous  Once 10/01/19 1014 10/01/19 1208   09/29/19 1800  cefTRIAXone (ROCEPHIN) 1 g in sodium chloride 0.9 % 100 mL IVPB  Status:  Discontinued     1 g 200 mL/hr over 30 Minutes Intravenous Every 24 hours 09/29/19 1406 10/01/19 1014   09/27/19 2200  cefTRIAXone (ROCEPHIN) 1 g in sodium chloride 0.9 % 100 mL IVPB  Status:  Discontinued      1 g 200 mL/hr over 30 Minutes Intravenous Every 24 hours 09/27/19 1355 09/27/19 1404   09/27/19 1800  levofloxacin (LEVAQUIN) IVPB 750 mg  Status:  Discontinued     750 mg 100 mL/hr over 90 Minutes Intravenous Every 24 hours 09/27/19 1654 09/29/19 1406   09/27/19 1500  fluconazole (DIFLUCAN) IVPB 200 mg     200 mg 100 mL/hr over 60 Minutes Intravenous Daily 09/27/19 1357 09/29/19 1732   09/27/19 1500  cefTRIAXone (ROCEPHIN) 2 g in sodium chloride 0.9 % 100 mL IVPB  Status:  Discontinued     2 g 200 mL/hr over 30 Minutes Intravenous Every 24 hours 09/27/19 1404 09/27/19 1633   09/27/19 1400  cefTRIAXone (ROCEPHIN) 1 g in sodium chloride 0.9 % 100 mL IVPB  Status:  Discontinued     1 g 200 mL/hr over 30 Minutes Intravenous  Once 09/27/19 1348 09/27/19 1404        Objective:   Vitals:   10/02/19 2049 10/03/19 0500 10/03/19 2134 10/04/19 1337  BP: (!) 154/78 140/79 (!) 157/79 (!) 151/77  Pulse: 77 78 80 72  Resp: 18 20 20  16  Temp: 98.2 F (36.8 C) 98.2 F (36.8 C) 98.5 F (36.9 C) 98 F (36.7 C)  TempSrc: Oral Oral Oral Oral  SpO2: 98% 96% 96% 96%  Weight:      Height:        Wt Readings from Last 3 Encounters:  09/27/19 81.5 kg  09/15/19 86.2 kg  09/10/19 77 kg     Intake/Output Summary (Last 24 hours) at 10/04/2019 1939 Last data filed at 10/04/2019 1700 Gross per 24 hour  Intake 959.9 ml  Output 1550 ml  Net -590.1 ml     Physical Exam  General exam: Alert, awake, oriented x 3 Respiratory system: Clear to auscultation. Respiratory effort normal.  Left Port-A-Cath in place Cardiovascular system:RRR. No murmurs, rubs, gallops. Gastrointestinal system: Abdomen is nondistended, soft and nontender. No organomegaly or masses felt. Normal bowel sounds heard.  GU: Indwelling Foley catheter. Central nervous system: Alert and oriented. No focal neurological deficits. Extremities: No C/C/E, +pedal pulses Skin: No rashes, lesions or ulcers Psychiatry: Judgement and  insight appear normal. Mood & affect appropriate.     Data Review:   Micro Results Recent Results (from the past 240 hour(s))  Urine culture     Status: Abnormal   Collection Time: 09/27/19  1:49 PM   Specimen: Urine, Clean Catch  Result Value Ref Range Status   Specimen Description   Final    URINE, CLEAN CATCH Performed at [facility], [address] [place], [place] [address]    Special Requests   Final    NONE Performed at [facility], [address], [place], [place] [address]    Culture >=100,000 COLONIES/mL PROTEUS MIRABILIS (A)  Final   Report Status 09/30/2019 FINAL  Final   Organism ID, Bacteria PROTEUS MIRABILIS (A)  Final      Susceptibility   Proteus mirabilis - MIC*    AMPICILLIN <=2 SENSITIVE Sensitive     CEFAZOLIN <=4 SENSITIVE Sensitive     CEFTRIAXONE <=1 SENSITIVE Sensitive     CIPROFLOXACIN <=0.25 SENSITIVE Sensitive     GENTAMICIN <=1 SENSITIVE Sensitive     IMIPENEM 1 SENSITIVE Sensitive     NITROFURANTOIN 128 RESISTANT Resistant     TRIMETH/SULFA <=20 SENSITIVE Sensitive     AMPICILLIN/SULBACTAM <=2 SENSITIVE Sensitive     PIP/TAZO <=4 SENSITIVE Sensitive     * >=100,000 COLONIES/mL PROTEUS MIRABILIS  SARS CORONAVIRUS 2 (TAT 6-24 HRS) Nasopharyngeal Nasopharyngeal Swab     Status: None   Collection Time: 09/27/19  3:20 PM   Specimen: Nasopharyngeal Swab  Result Value Ref Range Status   SARS Coronavirus 2 NEGATIVE NEGATIVE Final    Comment: (NOTE) SARS-CoV-2 target nucleic acids are NOT DETECTED. The SARS-CoV-2 RNA is generally detectable in upper and lower respiratory specimens during the acute phase of infection. Negative results do not preclude SARS-CoV-2 infection, do not rule out co-infections with other pathogens, and should not be used as the sole basis for treatment or other patient management decisions. Negative results must be combined with clinical observations, patient history, and epidemiological information. The expected result is  Negative. Fact Sheet for Patients: SugarRoll.be Fact Sheet for Healthcare Providers: https://www.woods-mathews.com/ This test is not yet approved or cleared by the [place] FDA and  has been authorized for detection and/or diagnosis of SARS-CoV-2 by FDA under an Emergency Use Authorization (EUA). This EUA will remain  in effect (meaning this test can be used) for the duration of the COVID-19 declaration under Section 56 4(b)(1) of the Act, 21 U.S.C.  section 360bbb-3(b)(1), unless the authorization is terminated or revoked sooner. Performed at [facility], [place] [address] [place], [place] [address]     Radiology Reports Ct Abdomen Pelvis W Contrast  Result Date: 09/27/2019 CLINICAL DATA:  Left lower quadrant abdominal pain for several days. Lymphoma on chemotherapy. EXAM: CT ABDOMEN AND PELVIS WITH CONTRAST TECHNIQUE: Multidetector CT imaging of the abdomen and pelvis was performed using the standard protocol following bolus administration of intravenous contrast. CONTRAST:  149mL OMNIPAQUE IOHEXOL 300 MG/ML  SOLN COMPARISON:  08/07/2019 PET-CT.  07/24/2019 CT abdomen/pelvis. FINDINGS: Lower chest: No significant pulmonary nodules or acute consolidative airspace disease. Coronary atherosclerosis. Hepatobiliary: Normal liver size. No liver mass. Mild layering sludge in the gallbladder. No gallbladder wall thickening. No radiopaque cholelithiasis. No pericholecystic fluid. No biliary ductal dilatation. Pancreas: Normal, with no mass or duct dilation. Spleen: Normal size. No mass. Adrenals/Urinary Tract: Normal adrenals. Nonobstructing 2 mm upper and interpolar left renal stones. No hydronephrosis. Simple exophytic 1.7 cm posterior interpolar left renal cyst. Stable subcentimeter hypodense renal cortical lesions in right kidney, too small to characterize. Bladder collapsed by indwelling Foley catheter. Scattered minimal nondependent bladder gas is  expected given instrumentation. Diffuse bladder wall thickening. Multiple layering bladder stones, largest 7 mm, similar. Stomach/Bowel: Normal non-distended stomach. Normal caliber small bowel with no small bowel wall thickening. Normal appendix. Large right colonic stool volume. New mild circumferential rectal wall thickening. New focal pericolonic fat stranding in the proximal sigmoid colon with central fat density suggestive acute epiploic appendagitis (series 2/image 59). New mild circumferential wall thickening in the rectum with increased presacral space edema. Vascular/Lymphatic: Atherosclerotic nonaneurysmal abdominal aorta. Patent portal, splenic, hepatic and renal veins. Previously visualized extensive retroperitoneal and left pelvic adenopathy has substantially decreased. Representative short axis diameter 2.1 cm left para-aortic node (series 2/image 39), previously 4.3 cm on 07/24/2019 CT using similar measurement technique. Representative 1.0 cm left common iliac node (series 2/image 64), previously 2.9 cm. Representative 1.4 cm left external iliac node (series 2/image 78), previously 3.6 cm. No new abdominopelvic adenopathy. There is increased fat stranding throughout sites of adenopathy in the retroperitoneum and left pelvis. Reproductive: Moderately enlarged prostate. Other: No pneumoperitoneum, ascites or focal fluid collection. Musculoskeletal: No aggressive appearing focal osseous lesions. Mild thoracolumbar spondylosis. IMPRESSION: 1. Evidence of significant partial treatment response with substantially decreased retroperitoneal and left pelvic adenopathy. 2. Findings suggestive of acute epiploic appendagitis in the proximal sigmoid colon in the left lower quadrant of the abdomen. 3. Nonspecific new mild circumferential rectal wall thickening, with increased presacral edema. Uncertain if these represent treatment effects or nonspecific proctitis. 4. Large right colonic stool volume suggests  constipation. No bowel obstruction. No free air. 5. Diffuse bladder wall thickening, nonspecific, favor chronic given moderate prostatomegaly. Suggest correlation with urinalysis. Bladder decompressed by Foley catheter. Several layering bladder stones. 6. Nonobstructing left nephrolithiasis. 7.  Aortic Atherosclerosis (ICD10-I70.0). Electronically Signed   By: Ilona Sorrel M.D.   On: 09/27/2019 13:23     CBC Recent Labs  Lab 09/28/19 0622 09/30/19 0425 10/03/19 0526  WBC 8.7 6.1 4.7  HGB 7.6* 7.5* 8.1*  HCT 24.7* 25.1* 26.2*  PLT 586* 479* 433*  MCV 101.2* 102.0* 99.2  MCH 31.1 30.5 30.7  MCHC 30.8 29.9* 30.9  RDW 17.2* 17.4* 17.1*    Chemistries  Recent Labs  Lab 09/28/19 0622 09/30/19 0425 10/01/19 0532 10/03/19 0526 10/04/19 0654  NA 136 141 145 142 142  K 3.4* 2.7* 3.3* 2.5* 2.8*  CL 101 110  112* 110 111  CO2 26 23 24 24 26   GLUCOSE 118* 118* 102* 105* 112*  BUN 17 12 11 11 9   CREATININE 1.18 1.25* 1.26* 1.17 1.17  CALCIUM 9.1 9.1 8.9 9.0 9.1  MG  --   --  2.0  --  2.0   ------------------------------------------------------------------------------------------------------------------ No results for input(s): CHOL, HDL, LDLCALC, TRIG, CHOLHDL, LDLDIRECT in the last 72 hours.  Lab Results  Component Value Date   HGBA1C 5.6 07/24/2019   ------------------------------------------------------------------------------------------------------------------ No results for input(s): TSH, T4TOTAL, T3FREE, THYROIDAB in the last 72 hours.  Invalid input(s): FREET3 ------------------------------------------------------------------------------------------------------------------ No results for input(s): VITAMINB12, FOLATE, FERRITIN, TIBC, IRON, RETICCTPCT in the last 72 hours.  Coagulation profile No results for input(s): INR, PROTIME in the last 168 hours.  No results for input(s): DDIMER in the last 72 hours.  Cardiac Enzymes No results for input(s): CKMB, TROPONINI,  MYOGLOBIN in the last 168 hours.  Invalid input(s): CK ------------------------------------------------------------------------------------------------------------------ No results found for: BNP   Kathie Dike M.D on 10/04/2019 at 7:39 PM  Go to [email or web address] - for contact info  [facility] - Office  [phone]

## 2019-10-05 DIAGNOSIS — E876 Hypokalemia: Secondary | ICD-10-CM

## 2019-10-05 DIAGNOSIS — I2699 Other pulmonary embolism without acute cor pulmonale: Secondary | ICD-10-CM

## 2019-10-05 DIAGNOSIS — E1142 Type 2 diabetes mellitus with diabetic polyneuropathy: Secondary | ICD-10-CM

## 2019-10-05 DIAGNOSIS — C8513 Unspecified B-cell lymphoma, intra-abdominal lymph nodes: Secondary | ICD-10-CM

## 2019-10-05 DIAGNOSIS — R1084 Generalized abdominal pain: Secondary | ICD-10-CM

## 2019-10-05 DIAGNOSIS — N3000 Acute cystitis without hematuria: Secondary | ICD-10-CM

## 2019-10-05 DIAGNOSIS — T83511S Infection and inflammatory reaction due to indwelling urethral catheter, sequela: Secondary | ICD-10-CM

## 2019-10-05 DIAGNOSIS — N390 Urinary tract infection, site not specified: Secondary | ICD-10-CM

## 2019-10-05 DIAGNOSIS — K6389 Other specified diseases of intestine: Principal | ICD-10-CM

## 2019-10-05 LAB — BASIC METABOLIC PANEL
Anion gap: 8 (ref 5–15)
BUN: 9 mg/dL (ref 8–23)
CO2: 23 mmol/L (ref 22–32)
Calcium: 8.7 mg/dL — ABNORMAL LOW (ref 8.9–10.3)
Chloride: 110 mmol/L (ref 98–111)
Creatinine, Ser: 1.15 mg/dL (ref 0.61–1.24)
GFR calc Af Amer: >60 mL/min (ref >60)
GFR calc non Af Amer: >60 mL/min (ref >60)
Glucose, Bld: 101 mg/dL — ABNORMAL HIGH (ref 70–99)
Potassium: 3.6 mmol/L (ref 3.5–5.1)
Sodium: 141 mmol/L (ref 135–145)

## 2019-10-05 LAB — CBC
HCT: 25.2 % — ABNORMAL LOW (ref 39.0–52.0)
Hemoglobin: 7.9 g/dL — ABNORMAL LOW (ref 13.0–17.0)
MCH: 31.6 pg (ref 26.0–34.0)
MCHC: 31.3 g/dL (ref 30.0–36.0)
MCV: 100.8 fL — ABNORMAL HIGH (ref 80.0–100.0)
Platelets: 414 10*3/uL — ABNORMAL HIGH (ref 150–400)
RBC: 2.5 MIL/uL — ABNORMAL LOW (ref 4.22–5.81)
RDW: 17.8 % — ABNORMAL HIGH (ref 11.5–15.5)
WBC: 5.5 10*3/uL (ref 4.0–10.5)
nRBC: 0 % (ref 0.0–0.2)

## 2019-10-05 NOTE — Progress Notes (Signed)
Patient Demographics:    Daniel Richards, is a 73 y.o. male, DOB - 1946-01-29, [id number]  Admit date - 09/27/2019   Admitting Physician [name], MD  Outpatient Primary MD for the patient is [name], FNP  LOS - 8   Chief Complaint  Patient presents with  . Abdominal Pain        Subjective:    [name] no nausea, no vomiting, no fever.  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, unspecified B-cell lymphoma type    Hypertension associated with type 2 diabetes mellitus (HCC)   PE (pulmonary thromboembolism) (HCC)   Urinary tract infection associated with indwelling urethral catheter ([place])   Brief Summary:- 73 y.o.malewith a history of diabetes, hypertension, recent diagnosis of B-cell lymphoma with chemotherapy admitted on 09/27/2019 with significant left-sided abdominal pain consistent with epiploic appendagitis   A/P 1)Epiploic Appendagitis in the proximal sigmoid area ---  ischemic infarction of an epiploic appendage caused by torsion or spontaneous thrombosis of the epiploic appendage central draining vein----abdominal pain continues to improve -Currently appears to be managed with oxycodone for pain control,  -conservative management with pain control as ordered -No indication for antibiotics, surgical intervention not indicated unless fails to resolve with conservative management  2)B-cell lymphoma---currently receiving chemotherapy from Dr. Delton Coombes,  -CT abdomen and pelvis from 09/27/2019 reveals significant partial treatment response to current  chemotherapy with substantially decreased retroperitoneal and left pelvic adenopathy. -Continue outpatient follow-up with oncology service.  3)Proteus catheter associated/complicated UTI in the setting of BPH with LUTs---recurrent UTIs, chronic indwelling Foley catheter which was last changed on 09/15/2019 -Continue Flomax -Urine culture from 08/26/2019 with  pansensitive Proteus mirabilis and Enterococcus faecalis  -Repeat urine culture this admission with Proteus, -Patient was treated with Levaquin, switched to Rocephin, de-escalated to Keflex, completed Keflex =-Foley catheter exchanged on 10/03/2019  4)DM-excellent control, A1c 5.6, -Continue to hold Metformin  -Patient has a UTI so STOP Invokana -Continue sliding scale insulin while inpatient   5)Depressive disorder--stable,  -continue Celexa, as well as Restoril as needed for sleep -No suicidal ideation or hallucinations.  6)HTN- -continue propanolol -Stable vital signs appreciated.  7)Generalized weakness/Debility--continue to feel weak and deconditioned. -Patient is now agreeable to go to SNF rehab as recommended by physical therapy -Await insurance approval for SNF rehab  8)Constipation---  on admission patient had significant constipation especially on the right side of the colon-  -Much improved with laxatives -Continue current bowel regimen.    9))H/o PE-- Dxed in 08/2019--- c/n Eliquis indefinitely as patient has underlying lymphoma/malignancy  10) hypokalemia----repleted and within normal limits currently; mentation also stable.  Continue to follow electrolytes trend intermittently.  11) essential tremors--- continue propanolol and primidone  -Disposition--physical therapist recommends SNF rehab, -Patient is now agreeable to go to SNF rehab as recommended by physical therapy  Disposition- SNF , once bed available and insurance approval obtained.   Procedure Foley catheter exchanged on 10/03/2019   Code Status:Full  Family Communication: (  patient is alert, awake and coherent) -no family at bedside.  Code Status : Full   Disposition Plan  : Anticipate discharge to SNF  Consults  :  Na  DVT Prophylaxis  : Eliquis- SCDs  Lab Results  Component Value Date   PLT 414 (H) 10/05/2019    Inpatient Medications  Scheduled Meds: . allopurinol  300 mg Oral Daily  . apixaban  5 mg Oral BID  . bisacodyl  10 mg Rectal Once  . calcitRIOL  0.5 mcg Oral Daily  . Chlorhexidine Gluconate Cloth  6 each Topical Daily  . citalopram  20 mg Oral QHS  . folic acid  1 mg Oral Daily  . gabapentin  600 mg Oral TID  . pantoprazole  40 mg Oral BID AC  . primidone  75 mg Oral Q8H  . propranolol  20 mg Oral BID  . senna-docusate  2 tablet Oral QHS  . sodium chloride flush  3 mL Intravenous Q12H  . sucralfate  1 g Oral QID  . temazepam  15 mg Oral QHS  . topiramate  200 mg Oral QHS   Continuous Infusions: . sodium chloride 20 mL/hr at 10/04/19 0046  . sodium chloride     PRN Meds:.sodium chloride, acetaminophen **OR** acetaminophen, albuterol, ondansetron **OR** ondansetron (ZOFRAN) IV, oxyCODONE, prochlorperazine, sodium chloride flush, traZODone    Anti-infectives (From admission, onward)   Start     Dose/Rate Route Frequency Ordered Stop   10/01/19 1900  cephALEXin (KEFLEX) capsule 250 mg     250 mg Oral 3 times daily 10/01/19 1852 10/03/19 2322   10/01/19 1230  cephALEXin (KEFLEX) capsule 500 mg     500 mg Oral  Once 10/01/19 1229 10/01/19 1438   10/01/19 1100  cefTRIAXone (ROCEPHIN) 1 g in sodium chloride 0.9 % 100 mL IVPB     1 g 200 mL/hr over 30 Minutes Intravenous  Once 10/01/19 1014 10/01/19 1208   09/29/19 1800  cefTRIAXone (ROCEPHIN) 1 g in sodium chloride 0.9 % 100 mL IVPB  Status:  Discontinued     1 g 200 mL/hr over 30 Minutes Intravenous Every 24 hours 09/29/19 1406 10/01/19 1014   09/27/19 2200  cefTRIAXone (ROCEPHIN) 1 g in sodium chloride 0.9 % 100 mL IVPB   Status:  Discontinued     1 g 200 mL/hr over 30 Minutes Intravenous Every 24 hours 09/27/19 1355 09/27/19 1404   09/27/19 1800  levofloxacin (LEVAQUIN) IVPB 750 mg  Status:  Discontinued     750 mg 100 mL/hr over 90 Minutes Intravenous Every 24 hours 09/27/19 1654 09/29/19 1406   09/27/19 1500  fluconazole (DIFLUCAN) IVPB 200 mg     200 mg 100 mL/hr over 60 Minutes Intravenous Daily 09/27/19 1357 09/29/19 1732   09/27/19 1500  cefTRIAXone (ROCEPHIN) 2 g in sodium chloride 0.9 % 100 mL IVPB  Status:  Discontinued     2 g 200 mL/hr over 30 Minutes Intravenous Every 24 hours 09/27/19 1404 09/27/19 1633   09/27/19 1400  cefTRIAXone (ROCEPHIN) 1 g in sodium chloride 0.9 % 100 mL IVPB  Status:  Discontinued     1 g 200 mL/hr over 30 Minutes Intravenous  Once 09/27/19 1348 09/27/19 1404        Objective:   Vitals:   10/04/19 1337 10/04/19 2214 10/05/19 0601 10/05/19 1354  BP: (!) 151/77 137/67 135/72 108/71  Pulse: 72 72 67 62  Resp: 16 18 18 18   Temp: 98 F (36.7 C) 98.6 F (37  C) 97.9 F (36.6 C) 97.8 F (36.6 C)  TempSrc: Oral Oral Oral Oral  SpO2: 96% 97% 97% 97%  Weight:      Height:        Wt Readings from Last 3 Encounters:  09/27/19 81.5 kg  09/15/19 86.2 kg  09/10/19 77 kg     Intake/Output Summary (Last 24 hours) at 10/05/2019 1655 Last data filed at 10/05/2019 1500 Gross per 24 hour  Intake 1200 ml  Output 1050 ml  Net 150 ml     Physical Exam General exam: Alert, awake, oriented x 3; in no acute distress.  Denies nausea, vomiting or fever.  Patient is feeling weak and deconditioned. Respiratory system: Clear to auscultation. Respiratory effort normal.  Good O2 sat on room air. Cardiovascular system:RRR. No murmurs, rubs, gallops. Gastrointestinal system: Abdomen is nondistended, soft and nontender. No organomegaly or masses felt. Normal bowel sounds heard. GU: foley catheter in place, good urine output appreciated. Central nervous system: Alert and  oriented. No focal neurological deficits. Extremities: No C/C/E, +pedal pulses Skin: No rashes, lesions or ulcers Psychiatry: Judgement and insight appear normal. Mood & affect appropriate.     Data Review:   Micro Results Recent Results (from the past 240 hour(s))  Urine culture     Status: Abnormal   Collection Time: 09/27/19  1:49 PM   Specimen: Urine, Clean Catch  Result Value Ref Range Status   Specimen Description   Final    URINE, CLEAN CATCH Performed at [facility], [address] [place], [place] [address]    Special Requests   Final    NONE Performed at [facility], [address], [place], [place] [address]    Culture >=100,000 COLONIES/mL PROTEUS MIRABILIS (A)  Final   Report Status 09/30/2019 FINAL  Final   Organism ID, Bacteria PROTEUS MIRABILIS (A)  Final      Susceptibility   Proteus mirabilis - MIC*    AMPICILLIN <=2 SENSITIVE Sensitive     CEFAZOLIN <=4 SENSITIVE Sensitive     CEFTRIAXONE <=1 SENSITIVE Sensitive     CIPROFLOXACIN <=0.25 SENSITIVE Sensitive     GENTAMICIN <=1 SENSITIVE Sensitive     IMIPENEM 1 SENSITIVE Sensitive     NITROFURANTOIN 128 RESISTANT Resistant     TRIMETH/SULFA <=20 SENSITIVE Sensitive     AMPICILLIN/SULBACTAM <=2 SENSITIVE Sensitive     PIP/TAZO <=4 SENSITIVE Sensitive     * >=100,000 COLONIES/mL PROTEUS MIRABILIS  SARS CORONAVIRUS 2 (TAT 6-24 HRS) Nasopharyngeal Nasopharyngeal Swab     Status: None   Collection Time: 09/27/19  3:20 PM   Specimen: Nasopharyngeal Swab  Result Value Ref Range Status   SARS Coronavirus 2 NEGATIVE NEGATIVE Final    Comment: (NOTE) SARS-CoV-2 target nucleic acids are NOT DETECTED. The SARS-CoV-2 RNA is generally detectable in upper and lower respiratory specimens during the acute phase of infection. Negative results do not preclude SARS-CoV-2 infection, do not rule out co-infections with other pathogens, and should not be used as the sole basis for treatment or other patient management  decisions. Negative results must be combined with clinical observations, patient history, and epidemiological information. The expected result is Negative. Fact Sheet for Patients: SugarRoll.be Fact Sheet for Healthcare Providers: https://www.woods-mathews.com/ This test is not yet approved or cleared by the [place] FDA and  has been authorized for detection and/or diagnosis of SARS-CoV-2 by FDA under an Emergency Use Authorization (EUA). This EUA will remain  in effect (meaning this test can be used) for the  duration of the COVID-19 declaration under Section 56 4(b)(1) of the Act, 21 U.S.C. section 360bbb-3(b)(1), unless the authorization is terminated or revoked sooner. Performed at [facility], [place] [address], [place], [place] [address]     Radiology Reports Ct Abdomen Pelvis W Contrast  Result Date: 09/27/2019 CLINICAL DATA:  Left lower quadrant abdominal pain for several days. Lymphoma on chemotherapy. EXAM: CT ABDOMEN AND PELVIS WITH CONTRAST TECHNIQUE: Multidetector CT imaging of the abdomen and pelvis was performed using the standard protocol following bolus administration of intravenous contrast. CONTRAST:  146mL OMNIPAQUE IOHEXOL 300 MG/ML  SOLN COMPARISON:  08/07/2019 PET-CT.  07/24/2019 CT abdomen/pelvis. FINDINGS: Lower chest: No significant pulmonary nodules or acute consolidative airspace disease. Coronary atherosclerosis. Hepatobiliary: Normal liver size. No liver mass. Mild layering sludge in the gallbladder. No gallbladder wall thickening. No radiopaque cholelithiasis. No pericholecystic fluid. No biliary ductal dilatation. Pancreas: Normal, with no mass or duct dilation. Spleen: Normal size. No mass. Adrenals/Urinary Tract: Normal adrenals. Nonobstructing 2 mm upper and interpolar left renal stones. No hydronephrosis. Simple exophytic 1.7 cm posterior interpolar left renal cyst. Stable subcentimeter hypodense renal  cortical lesions in right kidney, too small to characterize. Bladder collapsed by indwelling Foley catheter. Scattered minimal nondependent bladder gas is expected given instrumentation. Diffuse bladder wall thickening. Multiple layering bladder stones, largest 7 mm, similar. Stomach/Bowel: Normal non-distended stomach. Normal caliber small bowel with no small bowel wall thickening. Normal appendix. Large right colonic stool volume. New mild circumferential rectal wall thickening. New focal pericolonic fat stranding in the proximal sigmoid colon with central fat density suggestive acute epiploic appendagitis (series 2/image 59). New mild circumferential wall thickening in the rectum with increased presacral space edema. Vascular/Lymphatic: Atherosclerotic nonaneurysmal abdominal aorta. Patent portal, splenic, hepatic and renal veins. Previously visualized extensive retroperitoneal and left pelvic adenopathy has substantially decreased. Representative short axis diameter 2.1 cm left para-aortic node (series 2/image 39), previously 4.3 cm on 07/24/2019 CT using similar measurement technique. Representative 1.0 cm left common iliac node (series 2/image 64), previously 2.9 cm. Representative 1.4 cm left external iliac node (series 2/image 78), previously 3.6 cm. No new abdominopelvic adenopathy. There is increased fat stranding throughout sites of adenopathy in the retroperitoneum and left pelvis. Reproductive: Moderately enlarged prostate. Other: No pneumoperitoneum, ascites or focal fluid collection. Musculoskeletal: No aggressive appearing focal osseous lesions. Mild thoracolumbar spondylosis. IMPRESSION: 1. Evidence of significant partial treatment response with substantially decreased retroperitoneal and left pelvic adenopathy. 2. Findings suggestive of acute epiploic appendagitis in the proximal sigmoid colon in the left lower quadrant of the abdomen. 3. Nonspecific new mild circumferential rectal wall thickening,  with increased presacral edema. Uncertain if these represent treatment effects or nonspecific proctitis. 4. Large right colonic stool volume suggests constipation. No bowel obstruction. No free air. 5. Diffuse bladder wall thickening, nonspecific, favor chronic given moderate prostatomegaly. Suggest correlation with urinalysis. Bladder decompressed by Foley catheter. Several layering bladder stones. 6. Nonobstructing left nephrolithiasis. 7.  Aortic Atherosclerosis (ICD10-I70.0). Electronically Signed   By: Ilona Sorrel M.D.   On: 09/27/2019 13:23     CBC Recent Labs  Lab 09/30/19 0425 10/03/19 0526 10/05/19 0606  WBC 6.1 4.7 5.5  HGB 7.5* 8.1* 7.9*  HCT 25.1* 26.2* 25.2*  PLT 479* 433* 414*  MCV 102.0* 99.2 100.8*  MCH 30.5 30.7 31.6  MCHC 29.9* 30.9 31.3  RDW 17.4* 17.1* 17.8*    Chemistries  Recent Labs  Lab 09/30/19 0425 10/01/19 0532 10/03/19 0526 10/04/19 0654 10/05/19 0606  NA 141 145  142 142 141  K 2.7* 3.3* 2.5* 2.8* 3.6  CL 110 112* 110 111 110  CO2 23 24 24 26 23   GLUCOSE 118* 102* 105* 112* 101*  BUN 12 11 11 9 9   CREATININE 1.25* 1.26* 1.17 1.17 1.15  CALCIUM 9.1 8.9 9.0 9.1 8.7*  MG  --  2.0  --  2.0  --    ------------------------------------------------------------------------------------------------------------------ No results for input(s): CHOL, HDL, LDLCALC, TRIG, CHOLHDL, LDLDIRECT in the last 72 hours.  Lab Results  Component Value Date   HGBA1C 5.6 07/24/2019   ------------------------------------------------------------------------------------------------------------------ No results for input(s): TSH, T4TOTAL, T3FREE, THYROIDAB in the last 72 hours.  Invalid input(s): FREET3 ------------------------------------------------------------------------------------------------------------------ No results for input(s): VITAMINB12, FOLATE, FERRITIN, TIBC, IRON, RETICCTPCT in the last 72 hours.  Coagulation profile No results for input(s): INR,  PROTIME in the last 168 hours.  No results for input(s): DDIMER in the last 72 hours.  Cardiac Enzymes No results for input(s): CKMB, TROPONINI, MYOGLOBIN in the last 168 hours.  Invalid input(s): CK ------------------------------------------------------------------------------------------------------------------ No results found for: BNP   Barton Dubois M.D on 10/05/2019 at 4:55 PM  Go to [email or web address] - for contact info  [facility] - Office  [phone]

## 2019-10-06 ENCOUNTER — Ambulatory Visit (HOSPITAL_COMMUNITY): Payer: Medicare Other

## 2019-10-06 ENCOUNTER — Ambulatory Visit (HOSPITAL_COMMUNITY): Payer: Medicare Other | Admitting: Hematology

## 2019-10-06 ENCOUNTER — Other Ambulatory Visit (HOSPITAL_COMMUNITY): Payer: Medicare Other

## 2019-10-06 DIAGNOSIS — N3000 Acute cystitis without hematuria: Secondary | ICD-10-CM

## 2019-10-06 LAB — CBC
HCT: 26.2 % — ABNORMAL LOW (ref 39.0–52.0)
Hemoglobin: 8.1 g/dL — ABNORMAL LOW (ref 13.0–17.0)
MCH: 31.4 pg (ref 26.0–34.0)
MCHC: 30.9 g/dL (ref 30.0–36.0)
MCV: 101.6 fL — ABNORMAL HIGH (ref 80.0–100.0)
Platelets: 420 10*3/uL — ABNORMAL HIGH (ref 150–400)
RBC: 2.58 MIL/uL — ABNORMAL LOW (ref 4.22–5.81)
RDW: 18 % — ABNORMAL HIGH (ref 11.5–15.5)
WBC: 5.6 10*3/uL (ref 4.0–10.5)
nRBC: 0 % (ref 0.0–0.2)

## 2019-10-06 NOTE — Care Management Important Message (Signed)
Important Message  Patient Details  Name: Daniel Richards MRN: [id number] Date of Birth: 1945/12/25   Medicare Important Message Given:  Yes     [name] 10/06/2019, 10:28 AM

## 2019-10-06 NOTE — Consult Note (Signed)
[facility] Consultation Oncology  Name: Daniel Richards      MRN: [id number]    Location: A319/A319-01  Date: 10/06/2019 Time:11:34 AM    REFERRING PHYSICIAN: Dr. Dyann Kief  REASON FOR CONSULT: Large B-cell lymphoma   DIAGNOSIS: Epiploic appendagitis and severe deconditioning  HISTORY OF PRESENT ILLNESS: Daniel Richards is a very pleasant 73 year old white male who is seen in consultation at the request of Dr. Dyann Kief for further management of his diffuse large B-cell lymphoma.  He is known to me from my office visits and was diagnosed with stage IV diffuse large B-cell lymphoma with a 25-month history of generalized weakness with no B symptoms.  He received first cycle of R mini CHOP on 08/20/2019 and was admitted to the hospital with fever, UTI and bilateral pulmonary embolism.  He was started on Eliquis at that time.  He received cycle 2 on 09/11/2019.  He denied any chemotherapy related side effects after cycle 2 including nausea, vomiting, diarrhea or constipation.  Denies any numbness or tingling in extremities.  However, he presented to the ER on 09/27/2019 with left-sided lower quadrant abdominal pain.  CT scan on the same day showed findings suggestive of acute epiploic appendagitis in the proximal sigmoid colon in the left lower quadrant of the abdomen.  Nonspecific new mild circumferential rectal wall thickening with increased presacral edema.  Large right colon stools suggesting constipation.  Diffuse bladder wall thickening, nonspecific with moderate splenomegaly with indwelling Foley catheter.  Evidence of significant partial response with substantially decreased retroperitoneal and left pelvic adenopathy.  He was treated with pain medication.  His urine culture from 09/27/2019 also showed Proteus mirabilis, resistant to nitrofurantoin.  He had Foley catheter changed.  He was seen by rehab while in the hospital.  Today his son is at the bedside.  Patient is eager to get his chemotherapy  going.  PAST MEDICAL HISTORY:   Past Medical History:  Diagnosis Date  . Anxiety and depression   . Cancer ([place])    lymphoma  . Diabetes mellitus 06/09/2011   pt taking metformin  . DJD of shoulder    right   . Hyperlipidemia   . Hypertension   . Migraines   . Port-A-Cath in place 08/13/2019  . Tremor   . Vitamin D deficiency     ALLERGIES: Allergies  Allergen Reactions  . Vancomycin Hives    Patient received Benadryl to treat the hives      MEDICATIONS: I have reviewed the patient's current medications.     PAST SURGICAL HISTORY Past Surgical History:  Procedure Laterality Date  . BACK SURGERY    . KNEE SURGERY    . PORTACATH PLACEMENT Left 08/08/2019   Procedure: INSERTION PORT-A-CATH;  Surgeon: Aviva Signs, MD;  Location: [facility];  Service: General;  Laterality: Left;  . SHOULDER SURGERY      FAMILY HISTORY: Family History  Problem Relation Age of Onset  . Cancer Sister   . Cancer Sister   . Cancer Brother   . Heart attack Father   . Cancer Brother   . Heart attack Brother   . Healthy Son   . Healthy Daughter   . Migraines Neg Hx     SOCIAL HISTORY:  reports that he has never smoked. He has never used smokeless tobacco. He reports that he does not drink alcohol or use drugs.  PERFORMANCE STATUS: The patient's performance status is 3 - Symptomatic, >50% confined to bed  PHYSICAL EXAM: Most Recent Vital Signs:  Blood pressure (!) 152/75, pulse 67, temperature 99.1 F (37.3 C), temperature source Oral, resp. rate 17, height 5\' 11"  (1.803 m), weight 179 lb 10.8 oz (81.5 kg), SpO2 96 %. BP (!) 152/75 (BP Location: Right Arm)   Pulse 67   Temp 99.1 F (37.3 C) (Oral)   Resp 17   Ht 5\' 11"  (1.803 m)   Wt 179 lb 10.8 oz (81.5 kg)   SpO2 96%   BMI 25.06 kg/m  General appearance: alert, cooperative and appears stated age Neck: no adenopathy and thyroid not enlarged, symmetric, no tenderness/mass/nodules Lungs: clear to auscultation bilaterally Heart:  regular rate and rhythm Abdomen: Soft, very mild tenderness in the left lower quadrant.  No palpable masses. Extremities: extremities normal, atraumatic, no cyanosis or edema Skin: Skin color, texture, turgor normal. No rashes or lesions Lymph nodes: Cervical, supraclavicular, and axillary nodes normal. Neurologic: Grossly normal  LABORATORY DATA:  Results for orders placed or performed during the hospital encounter of 09/27/19 (from the past 48 hour(s))  CBC     Status: Abnormal   Collection Time: 10/05/19  6:06 AM  Result Value Ref Range   WBC 5.5 4.0 - 10.5 K/uL   RBC 2.50 (L) 4.22 - 5.81 MIL/uL   Hemoglobin 7.9 (L) 13.0 - 17.0 g/dL   HCT 25.2 (L) 39.0 - 52.0 %   MCV 100.8 (H) 80.0 - 100.0 fL   MCH 31.6 26.0 - 34.0 pg   MCHC 31.3 30.0 - 36.0 g/dL   RDW 17.8 (H) 11.5 - 15.5 %   Platelets 414 (H) 150 - 400 K/uL   nRBC 0.0 0.0 - 0.2 %    Comment: Performed at [facility], [address], [place], [place] [id number]  Basic metabolic panel     Status: Abnormal   Collection Time: 10/05/19  6:06 AM  Result Value Ref Range   Sodium 141 135 - 145 mmol/L   Potassium 3.6 3.5 - 5.1 mmol/L    Comment: DELTA CHECK NOTED   Chloride 110 98 - 111 mmol/L   CO2 23 22 - 32 mmol/L   Glucose, Bld 101 (H) 70 - 99 mg/dL   BUN 9 8 - 23 mg/dL   Creatinine, Ser 1.15 0.61 - 1.24 mg/dL   Calcium 8.7 (L) 8.9 - 10.3 mg/dL   GFR calc non Af Amer >60 >60 mL/min   GFR calc Af Amer >60 >60 mL/min   Anion gap 8 5 - 15    Comment: Performed at [facility], [address], [place], [place] [address]  CBC     Status: Abnormal   Collection Time: 10/06/19  5:47 AM  Result Value Ref Range   WBC 5.6 4.0 - 10.5 K/uL   RBC 2.58 (L) 4.22 - 5.81 MIL/uL   Hemoglobin 8.1 (L) 13.0 - 17.0 g/dL   HCT 26.2 (L) 39.0 - 52.0 %   MCV 101.6 (H) 80.0 - 100.0 fL   MCH 31.4 26.0 - 34.0 pg   MCHC 30.9 30.0 - 36.0 g/dL   RDW 18.0 (H) 11.5 - 15.5 %   Platelets 420 (H) 150 - 400 K/uL   nRBC 0.0 0.0 - 0.2 %    Comment:  Performed at [facility], [address], [place], [place] [address]      RADIOGRAPHY: I have independently reviewed CT scan from 09/27/2019.   ASSESSMENT and PLAN:  1.  Diffuse large B-cell lymphoma: -Diagnosed with stage IV diffuse large B-cell lymphoma with 62-month history of severe weakness. -2  cycles of R mini CHOP on 08/20/2019 09/11/2019. -CT scan on 09/27/2019 showed substantial decrease in the extensive retroperitoneal and left pelvic adenopathy.  Representative short axis lymph node 2.1 cm, previously 4.3 cm.  1 cm left common iliac lymph node, previously 2.9 cm.  1.4 cm left external iliac lymph node previously 3.6 cm. -Patient currently too weak to receive his next cycle. -He will be discharged to SNF today.  I will reevaluate him in 7 to 10 days and will likely restart his chemo in 7 to 10 days once he is discharged home.  2.  Epiploic appendagitis: -He presented with left lower quadrant pain, CT scan on 09/27/2019 showed acute epiploic appendagititis in the proximal sigmoid area. -This has improved with pain management.  Today he has very mild tenderness.  3.  Proteus mirabilis UTI: -Urinary culture from 09/27/2019 showed Proteus mirabilis, resistant to nitrofurantoin.  He was treated with Levaquin, switched to Rocephin and D escalated to Keflex which was completed. -Foley catheter exchanged on 10/03/2019.  All questions were answered. The patient knows to call the clinic with any problems, questions or concerns. We can certainly see the patient much sooner if necessary.   Derek Jack

## 2019-10-06 NOTE — TOC Progression Note (Signed)
Transition of Care [facility]) - Progression Note    Patient Details  Name: Daniel Richards MRN: [id number] Date of Birth: January 03, 1946  Transition of Care [facility]) CM/SW Contact  Boneta Lucks, RN Phone Number: 10/06/2019, 2:44 PM  Clinical Narrative:   Dr. Raliegh Ip consulted on patient, CM and family at the beside. Patient is to weak for chemo, needing short term rehab.  Only bed offer is [facility] in [place].  Spoke with Tammy, she and patient agrees. Plan is for 10 day of rehab then return home with home health. [facility] is active with [facility]. Then patient can resume Chemo. Cancer center will contact with appointment time.     Expected Discharge Plan: Home/Self Care Barriers to Discharge: Barriers Resolved  Expected Discharge Plan and Services Expected Discharge Plan: Home/Self Care      Expected Discharge Date: 10/03/19                   Readmission Risk Interventions Readmission Risk Prevention Plan 09/30/2019 07/30/2019 07/28/2019  Post Dischage Appt - Not Complete -  Appt Comments - [facility] MD will follow while pt at short term rehab -  Medication Screening - - Complete  Transportation Screening Complete - Complete  Medication Review Press photographer) Complete - -  PCP or Specialist appointment within 3-5 days of discharge Not Complete - -  HRI or Home Care Consult Complete - -  SW Recovery Care/Counseling Consult Complete - -  Palliative Care Screening Not Complete - -  [place] Not Complete - -  Some recent data might be hidden

## 2019-10-06 NOTE — Progress Notes (Signed)
Patient Demographics:    Daniel Richards, is a 73 y.o. male, DOB - 08-Aug-1946, [id number]  Admit date - 09/27/2019   Admitting Physician [name], MD  Outpatient Primary MD for the patient is Daniel Sender, FNP  LOS - 9   Chief Complaint  Patient presents with  . Abdominal Pain        Subjective:    [name] stable overall.  No fever, no chest pain, no nausea, no vomiting.  Expressed having good bowel movement and denying dysuria or abdominal pain.  Continues to be weak and deconditioned.  Medically stable for discharge to skilled nursing facility for further care and rehabilitation.     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, unspecified B-cell lymphoma type    Hypertension associated with type 2 diabetes mellitus (HCC)   PE (pulmonary thromboembolism) (HCC)   Urinary tract infection associated with indwelling urethral catheter (HCC)   Acute cystitis without hematuria   Brief Summary:- 73 y.o.malewith a history of diabetes, hypertension, recent diagnosis of B-cell lymphoma with chemotherapy admitted on 09/27/2019 with significant left-sided abdominal pain consistent with epiploic appendagitis   A/P 1)Epiploic Appendagitis in the proximal sigmoid area ---  ischemic infarction of an epiploic appendage caused by torsion or spontaneous thrombosis of the epiploic appendage central draining vein----abdominal pain continues to improve -Currently appears to be managed with oxycodone for pain control,  -conservative management with pain control as ordered -No indication for antibiotics, surgical intervention not indicated unless fails to resolve with conservative  management.  2)B-cell lymphoma---currently receiving chemotherapy from Dr. Delton Coombes,  -CT abdomen and pelvis from 09/27/2019 reveals significant partial treatment response to current chemotherapy with substantially decreased retroperitoneal and left pelvic adenopathy. -Continue outpatient follow-up with oncology service after rehabilitation.  3)Proteus catheter associated/complicated UTI in the setting of BPH with LUTs---recurrent UTIs, chronic indwelling Foley catheter which was last changed on 09/15/2019 -Continue Flomax -Urine culture from 08/26/2019 with  pansensitive Proteus mirabilis and Enterococcus faecalis  -Repeat urine culture this admission with Proteus, -Patient was treated with Levaquin, switched to Rocephin, de-escalated to Keflex, completed Keflex =-Foley catheter exchanged on 10/03/2019  4)DM-excellent control, A1c 5.6, -Continue to hold Metformin  -Patient has a UTI so STOP Invokana -Continue sliding scale insulin while inpatient   5)Depressive disorder--stable,  -continue Celexa, as well as Restoril as needed for sleep -No suicidal ideation or hallucinations.  6)HTN- -continue propanolol -Stable vital signs appreciated.  7)Generalized weakness/Debility--continue to feel weak and deconditioned. -Patient is now agreeable to go to SNF rehab as recommended by physical therapy -Await insurance approval for SNF rehab  8)Constipation---  on admission patient had significant constipation especially on the right side of the colon-  -Much improved with laxatives -Continue current bowel regimen.    9))H/o PE-- Dxed in 08/2019--- c/n Eliquis indefinitely as patient has underlying lymphoma/malignancy  10) hypokalemia----repleted and within normal limits currently; mentation also stable.  Continue to follow electrolytes trend intermittently.  11) essential tremors--- continue propanolol and primidone  -Disposition--physical therapist recommends SNF rehab,  -Patient is now agreeable to  go to SNF rehab as recommended by physical therapy  Disposition- SNF , once bed available and insurance approval obtained.   Procedure Foley catheter exchanged on 10/03/2019  Code Status:Full  Family Communication: (patient is alert, awake and coherent) -no family at bedside.  Code Status : Full   Disposition Plan  : Anticipate discharge to SNF  Consults  :  Na  DVT Prophylaxis  : Eliquis- SCDs  Lab Results  Component Value Date   PLT 420 (H) 10/06/2019    Inpatient Medications  Scheduled Meds: . allopurinol  300 mg Oral Daily  . apixaban  5 mg Oral BID  . bisacodyl  10 mg Rectal Once  . calcitRIOL  0.5 mcg Oral Daily  . Chlorhexidine Gluconate Cloth  6 each Topical Daily  . citalopram  20 mg Oral QHS  . folic acid  1 mg Oral Daily  . gabapentin  600 mg Oral TID  . pantoprazole  40 mg Oral BID AC  . primidone  75 mg Oral Q8H  . propranolol  20 mg Oral BID  . senna-docusate  2 tablet Oral QHS  . sodium chloride flush  3 mL Intravenous Q12H  . sucralfate  1 g Oral QID  . temazepam  15 mg Oral QHS  . topiramate  200 mg Oral QHS   Continuous Infusions: . sodium chloride 20 mL/hr at 10/04/19 0046  . sodium chloride     PRN Meds:.sodium chloride, acetaminophen **OR** acetaminophen, albuterol, ondansetron **OR** ondansetron (ZOFRAN) IV, oxyCODONE, prochlorperazine, sodium chloride flush, traZODone    Anti-infectives (From admission, onward)   Start     Dose/Rate Route Frequency Ordered Stop   10/01/19 1900  cephALEXin (KEFLEX) capsule 250 mg     250 mg Oral 3 times daily 10/01/19 1852 10/03/19 2322   10/01/19 1230  cephALEXin (KEFLEX) capsule 500 mg     500 mg Oral  Once 10/01/19 1229 10/01/19 1438   10/01/19 1100  cefTRIAXone (ROCEPHIN) 1 g in sodium chloride 0.9 % 100 mL IVPB     1 g 200 mL/hr over 30 Minutes Intravenous  Once 10/01/19 1014 10/01/19 1208   09/29/19 1800  cefTRIAXone (ROCEPHIN) 1 g in sodium chloride 0.9 % 100  mL IVPB  Status:  Discontinued     1 g 200 mL/hr over 30 Minutes Intravenous Every 24 hours 09/29/19 1406 10/01/19 1014   09/27/19 2200  cefTRIAXone (ROCEPHIN) 1 g in sodium chloride 0.9 % 100 mL IVPB  Status:  Discontinued     1 g 200 mL/hr over 30 Minutes Intravenous Every 24 hours 09/27/19 1355 09/27/19 1404   09/27/19 1800  levofloxacin (LEVAQUIN) IVPB 750 mg  Status:  Discontinued     750 mg 100 mL/hr over 90 Minutes Intravenous Every 24 hours 09/27/19 1654 09/29/19 1406   09/27/19 1500  fluconazole (DIFLUCAN) IVPB 200 mg     200 mg 100 mL/hr over 60 Minutes Intravenous Daily 09/27/19 1357 09/29/19 1732   09/27/19 1500  cefTRIAXone (ROCEPHIN) 2 g in sodium chloride 0.9 % 100 mL IVPB  Status:  Discontinued     2 g 200 mL/hr over 30 Minutes Intravenous Every 24 hours 09/27/19 1404 09/27/19 1633   09/27/19 1400  cefTRIAXone (ROCEPHIN) 1 g in sodium chloride 0.9 % 100 mL IVPB  Status:  Discontinued     1 g 200 mL/hr over 30 Minutes Intravenous  Once 09/27/19 1348 09/27/19 1404        Objective:   Vitals:   10/05/19 2132  10/06/19 0530 10/06/19 0900 10/06/19 1314  BP: (!) 148/75 134/77 (!) 152/75 138/67  Pulse: 69 64 67 65  Resp: 18 17  18   Temp: 98.1 F (36.7 C) 98.2 F (36.8 C) 99.1 F (37.3 C) 98.8 F (37.1 C)  TempSrc:   Oral Oral  SpO2: 97% 95% 96% 96%  Weight:      Height:        Wt Readings from Last 3 Encounters:  09/27/19 81.5 kg  09/15/19 86.2 kg  09/10/19 77 kg     Intake/Output Summary (Last 24 hours) at 10/06/2019 1419 Last data filed at 10/06/2019 1300 Gross per 24 hour  Intake 960 ml  Output 1400 ml  Net -440 ml     Physical Exam General exam: Alert, awake, oriented x 3; in no acute distress currently.  Patient denies nausea, vomiting or chest pain.  Is feeling weak and deconditioned.  No fever.  Reports abdominal pain is well controlled with current analgesic regimen. Respiratory system: Clear to auscultation. Respiratory effort normal.  Good O2  sat on room air. Cardiovascular system:RRR. No murmurs, rubs, gallops. Gastrointestinal system: Abdomen is nondistended, soft and nontender. No organomegaly or masses felt. Normal bowel sounds heard. GU: Foley catheter in place; no dysuria. Central nervous system: Alert and oriented. No focal neurological deficits. Extremities: No C/C/E, +pedal pulses Skin: No rashes, lesions or ulcers Psychiatry: Judgement and insight appear normal. Mood & affect appropriate.     Data Review:   Micro Results Recent Results (from the past 240 hour(s))  Urine culture     Status: Abnormal   Collection Time: 09/27/19  1:49 PM   Specimen: Urine, Clean Catch  Result Value Ref Range Status   Specimen Description   Final    URINE, CLEAN CATCH Performed at [facility], [address], [place], [place] [address]    Special Requests   Final    NONE Performed at [facility], [address], [place], [place] [address]    Culture >=100,000 COLONIES/mL PROTEUS MIRABILIS (A)  Final   Report Status 09/30/2019 FINAL  Final   Organism ID, Bacteria PROTEUS MIRABILIS (A)  Final      Susceptibility   Proteus mirabilis - MIC*    AMPICILLIN <=2 SENSITIVE Sensitive     CEFAZOLIN <=4 SENSITIVE Sensitive     CEFTRIAXONE <=1 SENSITIVE Sensitive     CIPROFLOXACIN <=0.25 SENSITIVE Sensitive     GENTAMICIN <=1 SENSITIVE Sensitive     IMIPENEM 1 SENSITIVE Sensitive     NITROFURANTOIN 128 RESISTANT Resistant     TRIMETH/SULFA <=20 SENSITIVE Sensitive     AMPICILLIN/SULBACTAM <=2 SENSITIVE Sensitive     PIP/TAZO <=4 SENSITIVE Sensitive     * >=100,000 COLONIES/mL PROTEUS MIRABILIS  SARS CORONAVIRUS 2 (TAT 6-24 HRS) Nasopharyngeal Nasopharyngeal Swab     Status: None   Collection Time: 09/27/19  3:20 PM   Specimen: Nasopharyngeal Swab  Result Value Ref Range Status   SARS Coronavirus 2 NEGATIVE NEGATIVE Final    Comment: (NOTE) SARS-CoV-2 target nucleic acids are NOT DETECTED. The SARS-CoV-2 RNA is generally  detectable in upper and lower respiratory specimens during the acute phase of infection. Negative results do not preclude SARS-CoV-2 infection, do not rule out co-infections with other pathogens, and should not be used as the sole basis for treatment or other patient management decisions. Negative results must be combined with clinical observations, patient history, and epidemiological information. The expected result is Negative. Fact Sheet for Patients: SugarRoll.be Fact Sheet for Healthcare  Providers: https://www.woods-mathews.com/ This test is not yet approved or cleared by the [place] and  has been authorized for detection and/or diagnosis of SARS-CoV-2 by FDA under an Emergency Use Authorization (EUA). This EUA will remain  in effect (meaning this test can be used) for the duration of the COVID-19 declaration under Section 56 4(b)(1) of the Act, 21 U.S.C. section 360bbb-3(b)(1), unless the authorization is terminated or revoked sooner. Performed at [facility], [place] [address], [place], [place] [address]     Radiology Reports Ct Abdomen Pelvis W Contrast  Result Date: 09/27/2019 CLINICAL DATA:  Left lower quadrant abdominal pain for several days. Lymphoma on chemotherapy. EXAM: CT ABDOMEN AND PELVIS WITH CONTRAST TECHNIQUE: Multidetector CT imaging of the abdomen and pelvis was performed using the standard protocol following bolus administration of intravenous contrast. CONTRAST:  169mL OMNIPAQUE IOHEXOL 300 MG/ML  SOLN COMPARISON:  08/07/2019 PET-CT.  07/24/2019 CT abdomen/pelvis. FINDINGS: Lower chest: No significant pulmonary nodules or acute consolidative airspace disease. Coronary atherosclerosis. Hepatobiliary: Normal liver size. No liver mass. Mild layering sludge in the gallbladder. No gallbladder wall thickening. No radiopaque cholelithiasis. No pericholecystic fluid. No biliary ductal dilatation. Pancreas: Normal,  with no mass or duct dilation. Spleen: Normal size. No mass. Adrenals/Urinary Tract: Normal adrenals. Nonobstructing 2 mm upper and interpolar left renal stones. No hydronephrosis. Simple exophytic 1.7 cm posterior interpolar left renal cyst. Stable subcentimeter hypodense renal cortical lesions in right kidney, too small to characterize. Bladder collapsed by indwelling Foley catheter. Scattered minimal nondependent bladder gas is expected given instrumentation. Diffuse bladder wall thickening. Multiple layering bladder stones, largest 7 mm, similar. Stomach/Bowel: Normal non-distended stomach. Normal caliber small bowel with no small bowel wall thickening. Normal appendix. Large right colonic stool volume. New mild circumferential rectal wall thickening. New focal pericolonic fat stranding in the proximal sigmoid colon with central fat density suggestive acute epiploic appendagitis (series 2/image 59). New mild circumferential wall thickening in the rectum with increased presacral space edema. Vascular/Lymphatic: Atherosclerotic nonaneurysmal abdominal aorta. Patent portal, splenic, hepatic and renal veins. Previously visualized extensive retroperitoneal and left pelvic adenopathy has substantially decreased. Representative short axis diameter 2.1 cm left para-aortic node (series 2/image 39), previously 4.3 cm on 07/24/2019 CT using similar measurement technique. Representative 1.0 cm left common iliac node (series 2/image 64), previously 2.9 cm. Representative 1.4 cm left external iliac node (series 2/image 78), previously 3.6 cm. No new abdominopelvic adenopathy. There is increased fat stranding throughout sites of adenopathy in the retroperitoneum and left pelvis. Reproductive: Moderately enlarged prostate. Other: No pneumoperitoneum, ascites or focal fluid collection. Musculoskeletal: No aggressive appearing focal osseous lesions. Mild thoracolumbar spondylosis. IMPRESSION: 1. Evidence of significant partial  treatment response with substantially decreased retroperitoneal and left pelvic adenopathy. 2. Findings suggestive of acute epiploic appendagitis in the proximal sigmoid colon in the left lower quadrant of the abdomen. 3. Nonspecific new mild circumferential rectal wall thickening, with increased presacral edema. Uncertain if these represent treatment effects or nonspecific proctitis. 4. Large right colonic stool volume suggests constipation. No bowel obstruction. No free air. 5. Diffuse bladder wall thickening, nonspecific, favor chronic given moderate prostatomegaly. Suggest correlation with urinalysis. Bladder decompressed by Foley catheter. Several layering bladder stones. 6. Nonobstructing left nephrolithiasis. 7.  Aortic Atherosclerosis (ICD10-I70.0). Electronically Signed   By: Ilona Sorrel M.D.   On: 09/27/2019 13:23     CBC Recent Labs  Lab 09/30/19 0425 10/03/19 0526 10/05/19 0606 10/06/19 0547  WBC 6.1 4.7 5.5 5.6  HGB 7.5* 8.1* 7.9* 8.1*  HCT 25.1* 26.2* 25.2* 26.2*  PLT 479* 433* 414* 420*  MCV 102.0* 99.2 100.8* 101.6*  MCH 30.5 30.7 31.6 31.4  MCHC 29.9* 30.9 31.3 30.9  RDW 17.4* 17.1* 17.8* 18.0*    Chemistries  Recent Labs  Lab 09/30/19 0425 10/01/19 0532 10/03/19 0526 10/04/19 0654 10/05/19 0606  NA 141 145 142 142 141  K 2.7* 3.3* 2.5* 2.8* 3.6  CL 110 112* 110 111 110  CO2 23 24 24 26 23   GLUCOSE 118* 102* 105* 112* 101*  BUN 12 11 11 9 9   CREATININE 1.25* 1.26* 1.17 1.17 1.15  CALCIUM 9.1 8.9 9.0 9.1 8.7*  MG  --  2.0  --  2.0  --    ------------------------------------------------------------------------------------------------------------------ No results for input(s): CHOL, HDL, LDLCALC, TRIG, CHOLHDL, LDLDIRECT in the last 72 hours.  Lab Results  Component Value Date   HGBA1C 5.6 07/24/2019   ------------------------------------------------------------------------------------------------------------------ No results for input(s): TSH, T4TOTAL,  T3FREE, THYROIDAB in the last 72 hours.  Invalid input(s): FREET3 ------------------------------------------------------------------------------------------------------------------ No results for input(s): VITAMINB12, FOLATE, FERRITIN, TIBC, IRON, RETICCTPCT in the last 72 hours.  Coagulation profile No results for input(s): INR, PROTIME in the last 168 hours.  No results for input(s): DDIMER in the last 72 hours.  Cardiac Enzymes No results for input(s): CKMB, TROPONINI, MYOGLOBIN in the last 168 hours.  Invalid input(s): CK ------------------------------------------------------------------------------------------------------------------ No results found for: BNP   Barton Dubois M.D on 10/06/2019 at 2:19 PM  Go to [email or web address] - for contact info  [facility] - Office  [phone]

## 2019-10-07 DIAGNOSIS — E876 Hypokalemia: Secondary | ICD-10-CM

## 2019-10-07 LAB — SARS CORONAVIRUS 2 BY RT PCR (HOSPITAL ORDER, PERFORMED IN ~~LOC~~ HOSPITAL LAB): SARS Coronavirus 2: NEGATIVE

## 2019-10-07 MED ORDER — TEMAZEPAM 15 MG PO CAPS: 15.0000 mg | ORAL_CAPSULE | Freq: Every day | ORAL | 0 refills | Status: DC

## 2019-10-07 MED ORDER — MORPHINE SULFATE (PF) 2 MG/ML IV SOLN
2.0000 mg | Freq: Once | INTRAVENOUS | Status: AC
  Administered 2019-10-07: 2 mg via INTRAMUSCULAR
  Filled 2019-10-07: qty 1

## 2019-10-07 MED ORDER — MORPHINE SULFATE (PF) 2 MG/ML IV SOLN: 2.0000 mg | Freq: Once | INTRAVENOUS | Status: DC

## 2019-10-07 MED ORDER — OXYCODONE HCL 5 MG PO TABS: 5.0000 mg | ORAL_TABLET | ORAL | 0 refills | Status: DC | PRN

## 2019-10-07 NOTE — Plan of Care (Signed)

## 2019-10-07 NOTE — Discharge Summary (Signed)
Physician Discharge Summary  Daniel Richards [id number] DOB: 02/11/1946 DOA: 09/27/2019  PCP: [name], FNP  Admit date: 09/27/2019 Discharge date: 10/07/2019  Time spent: 35 minutes  Recommendations for Outpatient Follow-up:  1. Repeat basic metabolic panel in 1 week to follow electrolytes and renal function 2. Reassess blood pressure and further adjust antihypertensive regimen as needed 3. Close monitoring of patient's CBGs with future adjustment to hypoglycemic medication as required.   4. 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  Discharged to skilled nursing facility for further care and rehabilitation.  Diet recommendation: Heart healthy modified carbohydrate diet.  Filed Weights   09/27/19 0838 09/27/19 1808  Weight: 86.2 kg 81.5 kg    History of present illness:  As per H&P by Dr. Sandre Kitty on 09/27/2019 Daniel Richards  is a 73 y.o. male history of diabetes, hypertension, recent diagnosis of B-cell lymphoma with chemotherapy and persistent urinary retention with recurrent UTI in the setting of chronic indwelling catheter who presents on 09/27/2019 with significant left-sided abdominal pain consistent with epiploic appendagitis  -Urine culture from 08/26/2019 with  pansensitive Proteus mirabilis and Enterococcus faecalis   -Patient has not had BM in over 48 hours, left lower quadrant abdominal pain is  worsened since last evening -No fevers or chills No Nausea, Vomiting or Diarrhea -Has chronic indwelling Foley catheter which was last changed on 09/15/2019  -In ED CT abdomen and pelvis consistent with constipation and epiploic appendagitis of the proximal sigmoid  Not elevated, potassium is down to 2.7, creatinine 1.25 -WBC is 9.4 with H&H of 8.7 and 21.5 and platelets of 699 -UA suggestive of UTI, urine culture pending -COVID-19 test pending  -Additional history obtained from patient's son Daniel Richards at bedside -EDP gave Rocephin for possible UTI  Hospital Course:  1)Epiploic Appendagitis in the proximal sigmoid area ---ischemic infarction of an epiploic appendage caused by torsion or spontaneous thrombosis of the epiploic appendage central draining vein----abdominal pain continues to improve -Currently appears to be managed with oxycodone usage. -conservative management with pain medication as ordered. -No indication for antibiotics or surgical intervention at this time. If patient failed to resolve with conservative management or developed future problems, then will need to be considered.   2)B-cell lymphoma---currently receiving chemotherapy fromDr. Delton Coombes,  -CT abdomen and pelvis from 09/27/2019 revealssignificant partial treatment responseto current chemotherapywith substantially decreased retroperitoneal and left pelvic adenopathy. -Continue outpatient follow-up with oncology service after rehabilitation. -Resumption of chemotherapy treatment to be pursued after rehabilitation is completed.  3)Proteus catheter associated/complicated UTI in the setting ofBPH with LUTs---recurrent UTIs, chronic indwelling Foley catheter which was last changed on 09/15/2019 -Continue Flomax -Urine culture from 08/26/2019 with pansensitive Proteus mirabilis and Enterococcus faecalis. -Repeat urine culture this admission with Proteus, -Patient was treated with Levaquin, switched to  Rocephin, de-escalated to Keflex and completed completed Keflex  -Foley catheter exchanged on 10/03/2019  4)DM-excellent control, A1c 5.6, -Invokana has been discontinued at discharge -Resume metformin and modify carbohydrate diet. -Outpatient follow-up patient CBGs with further adjustment/discontinuation  of treatment if appropriate.  5)Depressive disorder/insomnia -Overall his mood is stable.  -continue Celexa, as well as Restoril as needed for sleep -No suicidal ideation or hallucinations.  6)HTN- -continue propanolol -Stable vital signs appreciated. -Heart healthy diet has been encouraged.  7)Generalized weakness/Debility--continue to feel weak and deconditioned. -Patient is now agreeable to go to SNF rehab as recommended by physical therapy -We will discharged to [facility] for further care and rehabilitation.  8)Constipation--- on admission patient had significant constipation especially on the right side of the colon. -Much improved with laxatives -Continue current bowel regimen.  -Maintain adequate hydration and increase physical activity.   9))H/o PE-- Dxed in 08/2019-- -continue the use of Eliquisindefinitely as patient has underlying lymphoma/malignancy, which increase blood viscosity and risk for future clots.  10) hypokalemia----repleted and within normal limits currently. -Magnesium level also stable.   -Continue to follow electrolytes trend intermittently.  11)Essential tremors---continue propanolol and primidone  12-chronic anemia: In the setting of B-cell lymphoma and chemotherapy management -Hemoglobin at 8.1 at discharge -No transfusion needed -No signs of operative bleeding.  Procedures: See below for x-ray reports. Foley catheter has been exchanged on 10/03/2019.  Consultations:  Oncology service  Discharge Exam: Vitals:   10/06/19 2111 10/07/19 0510  BP: 137/66 133/70  Pulse: 68 77  Resp: 18 16  Temp: 98.3 F (36.8 C) 98.3 F  (36.8 C)  SpO2: 96% 95%   General exam: Alert, awake, oriented x 3; in no acute distress currently.  Patient denies nausea, vomiting or chest pain.  Is feeling weak and deconditioned.  No fever.  Reports abdominal pain is well controlled with current analgesic regimen. Respiratory system: Clear to auscultation. Respiratory effort normal.  Good O2 sat on room air. Cardiovascular system:RRR. No murmurs, rubs, gallops. Gastrointestinal system: Abdomen is nondistended, soft and nontender. No organomegaly or masses felt. Normal bowel sounds heard. GU: Foley catheter in place; no dysuria. Central nervous system: Alert and oriented. No focal neurological deficits. Extremities: No C/C/E, +pedal pulses Skin: No rashes, lesions or ulcers Psychiatry: Judgement and insight appear normal. Mood & affect appropriate.     Discharge Instructions   Discharge Instructions    Diet - low sodium heart healthy   Complete by: As directed    Discharge instructions   Complete by: As directed    Heart healthy modified carbohydrate diet Maintain adequate hydration Continue as needed pain medication as instructed Rehabilitation and conditioning as per nursing home protocol Holding the steroids and chemotherapeutic agents until follow-up with oncology service.     Allergies as of 10/07/2019      Reactions   Vancomycin Hives   Patient received Benadryl to treat the hives      Medication List    STOP taking these medications   HYDROcodone-acetaminophen 10-325 MG tablet Commonly known as: NORCO   Invokana 100 MG Tabs tablet Generic drug: canagliflozin   lisinopril 20 MG tablet Commonly known as: ZESTRIL     TAKE these medications   acetaminophen 325 MG tablet Commonly known as: TYLENOL Take 2 tablets (650 mg total) by mouth every 6 (six) hours as needed for mild pain, fever or headache.   allopurinol 300 MG tablet Commonly known as: ZYLOPRIM Take 1 tablet (300 mg total) by mouth daily.    apixaban 5 MG Tabs tablet Commonly known as: ELIQUIS Take 1 tablet (5 mg total) by mouth 2 (two) times daily. What changed: Another medication with the same name was removed. Continue taking this medication, and follow the directions  you see here.   calcitRIOL 0.5 MCG capsule Commonly known as: ROCALTROL Take 1 capsule (0.5 mcg total) by mouth daily.   citalopram 20 MG tablet Commonly known as: CELEXA Take 1 tablet (20 mg total) by mouth at bedtime.   CYCLOPHOSPHAMIDE IV Inject into the vein every 21 ( twenty-one) days.   DOXORUBICIN HCL IV Inject into the vein every 21 ( twenty-one) days.   folic acid 1 MG tablet Commonly known as: FOLVITE Take 1 tablet (1 mg total) by mouth daily.   gabapentin 300 MG capsule Commonly known as: NEURONTIN Take 2 capsules (600 mg total) by mouth 3 (three) times daily.   lidocaine-prilocaine cream Commonly known as: EMLA Apply a small amount to port a cath site and cover with plastic wrap 1 hour prior to chemotherapy appointments   metFORMIN 500 MG tablet Commonly known as: GLUCOPHAGE Take 1 tablet (500 mg total) by mouth daily.   NON FORMULARY Diet: Regular, NAS, Consistent Carbohydrate   ondansetron 4 MG tablet Commonly known as: ZOFRAN Take 1 tablet (4 mg total) by mouth every 6 (six) hours as needed for nausea.   oxyCODONE 5 MG immediate release tablet Commonly known as: Oxy IR/ROXICODONE Take 1 tablet (5 mg total) by mouth every 4 (four) hours as needed for moderate pain or breakthrough pain.   pantoprazole 40 MG tablet Commonly known as: PROTONIX Take 1 tablet (40 mg total) by mouth 2 (two) times daily before a meal.   polyethylene glycol 17 g packet Commonly known as: MIRALAX / GLYCOLAX Take 17 g by mouth daily as needed for mild constipation.   GaviLAX 17 GM/SCOOP powder Generic drug: polyethylene glycol powder SMARTSIG:17 Gram(s) By Mouth Daily PRN   predniSONE 20 MG tablet Commonly known as: DELTASONE Take 3  tablets (60 mg total) by mouth daily. Take on days 1-5 of chemotherapy.   primidone 50 MG tablet Commonly known as: MYSOLINE Take 1.5 tablets (75 mg total) by mouth every 8 (eight) hours. What changed:   how much to take  when to take this   prochlorperazine 10 MG tablet Commonly known as: COMPAZINE Take 1 tablet (10 mg total) by mouth every 6 (six) hours as needed (Nausea or vomiting).   propranolol 40 MG tablet Commonly known as: INDERAL Take 1 tablet (40 mg total) by mouth 2 (two) times daily.   riTUXimab in sodium chloride 0.9 % 250 mL Inject into the vein every 21 ( twenty-one) days.   sucralfate 1 g tablet Commonly known as: CARAFATE Take 1 g by mouth 4 (four) times daily.   temazepam 15 MG capsule Commonly known as: RESTORIL Take 1 capsule (15 mg total) by mouth at bedtime. What changed: See the new instructions.   topiramate 100 MG tablet Commonly known as: TOPAMAX Take 2 tablets (200 mg total) by mouth at bedtime.   vinCRIStine 2 mg in sodium chloride 0.9 % 50 mL Inject 2 mg into the vein every 21 ( twenty-one) days.      Allergies  Allergen Reactions  . Vancomycin Hives    Patient received Benadryl to treat the hives    Contact information for follow-up providers    [facility]-Home Follow up.   Specialty: [place]: RN/ PT           Contact information for after-discharge care    [facility] .   Service: Skilled Chiropodist information: [address] [place] [phone]  The results of significant diagnostics from this hospitalization (including imaging, microbiology, ancillary and laboratory) are listed below for reference.    Significant Diagnostic Studies: Ct Abdomen Pelvis W Contrast  Result Date: 09/27/2019 CLINICAL DATA:  Left lower quadrant abdominal pain for several days. Lymphoma on chemotherapy. EXAM: CT  ABDOMEN AND PELVIS WITH CONTRAST TECHNIQUE: Multidetector CT imaging of the abdomen and pelvis was performed using the standard protocol following bolus administration of intravenous contrast. CONTRAST:  159mL OMNIPAQUE IOHEXOL 300 MG/ML  SOLN COMPARISON:  08/07/2019 PET-CT.  07/24/2019 CT abdomen/pelvis. FINDINGS: Lower chest: No significant pulmonary nodules or acute consolidative airspace disease. Coronary atherosclerosis. Hepatobiliary: Normal liver size. No liver mass. Mild layering sludge in the gallbladder. No gallbladder wall thickening. No radiopaque cholelithiasis. No pericholecystic fluid. No biliary ductal dilatation. Pancreas: Normal, with no mass or duct dilation. Spleen: Normal size. No mass. Adrenals/Urinary Tract: Normal adrenals. Nonobstructing 2 mm upper and interpolar left renal stones. No hydronephrosis. Simple exophytic 1.7 cm posterior interpolar left renal cyst. Stable subcentimeter hypodense renal cortical lesions in right kidney, too small to characterize. Bladder collapsed by indwelling Foley catheter. Scattered minimal nondependent bladder gas is expected given instrumentation. Diffuse bladder wall thickening. Multiple layering bladder stones, largest 7 mm, similar. Stomach/Bowel: Normal non-distended stomach. Normal caliber small bowel with no small bowel wall thickening. Normal appendix. Large right colonic stool volume. New mild circumferential rectal wall thickening. New focal pericolonic fat stranding in the proximal sigmoid colon with central fat density suggestive acute epiploic appendagitis (series 2/image 59). New mild circumferential wall thickening in the rectum with increased presacral space edema. Vascular/Lymphatic: Atherosclerotic nonaneurysmal abdominal aorta. Patent portal, splenic, hepatic and renal veins. Previously visualized extensive retroperitoneal and left pelvic adenopathy has substantially decreased. Representative short axis diameter 2.1 cm left para-aortic node  (series 2/image 39), previously 4.3 cm on 07/24/2019 CT using similar measurement technique. Representative 1.0 cm left common iliac node (series 2/image 64), previously 2.9 cm. Representative 1.4 cm left external iliac node (series 2/image 78), previously 3.6 cm. No new abdominopelvic adenopathy. There is increased fat stranding throughout sites of adenopathy in the retroperitoneum and left pelvis. Reproductive: Moderately enlarged prostate. Other: No pneumoperitoneum, ascites or focal fluid collection. Musculoskeletal: No aggressive appearing focal osseous lesions. Mild thoracolumbar spondylosis. IMPRESSION: 1. Evidence of significant partial treatment response with substantially decreased retroperitoneal and left pelvic adenopathy. 2. Findings suggestive of acute epiploic appendagitis in the proximal sigmoid colon in the left lower quadrant of the abdomen. 3. Nonspecific new mild circumferential rectal wall thickening, with increased presacral edema. Uncertain if these represent treatment effects or nonspecific proctitis. 4. Large right colonic stool volume suggests constipation. No bowel obstruction. No free air. 5. Diffuse bladder wall thickening, nonspecific, favor chronic given moderate prostatomegaly. Suggest correlation with urinalysis. Bladder decompressed by Foley catheter. Several layering bladder stones. 6. Nonobstructing left nephrolithiasis. 7.  Aortic Atherosclerosis (ICD10-I70.0). Electronically Signed   By: Ilona Sorrel M.D.   On: 09/27/2019 13:23    Microbiology: Recent Results (from the past 240 hour(s))  Urine culture     Status: Abnormal   Collection Time: 09/27/19  1:49 PM   Specimen: Urine, Clean Catch  Result Value Ref Range Status   Specimen Description   Final    URINE, CLEAN CATCH Performed at [facility], [address], [place], [place] [address]    Special Requests   Final    NONE Performed at [facility], [address] [place], [place] [address]    Culture >=100,000  COLONIES/mL PROTEUS MIRABILIS (  A)  Final   Report Status 09/30/2019 FINAL  Final   Organism ID, Bacteria PROTEUS MIRABILIS (A)  Final      Susceptibility   Proteus mirabilis - MIC*    AMPICILLIN <=2 SENSITIVE Sensitive     CEFAZOLIN <=4 SENSITIVE Sensitive     CEFTRIAXONE <=1 SENSITIVE Sensitive     CIPROFLOXACIN <=0.25 SENSITIVE Sensitive     GENTAMICIN <=1 SENSITIVE Sensitive     IMIPENEM 1 SENSITIVE Sensitive     NITROFURANTOIN 128 RESISTANT Resistant     TRIMETH/SULFA <=20 SENSITIVE Sensitive     AMPICILLIN/SULBACTAM <=2 SENSITIVE Sensitive     PIP/TAZO <=4 SENSITIVE Sensitive     * >=100,000 COLONIES/mL PROTEUS MIRABILIS  SARS CORONAVIRUS 2 (TAT 6-24 HRS) Nasopharyngeal Nasopharyngeal Swab     Status: None   Collection Time: 09/27/19  3:20 PM   Specimen: Nasopharyngeal Swab  Result Value Ref Range Status   SARS Coronavirus 2 NEGATIVE NEGATIVE Final    Comment: (NOTE) SARS-CoV-2 target nucleic acids are NOT DETECTED. The SARS-CoV-2 RNA is generally detectable in upper and lower respiratory specimens during the acute phase of infection. Negative results do not preclude SARS-CoV-2 infection, do not rule out co-infections with other pathogens, and should not be used as the sole basis for treatment or other patient management decisions. Negative results must be combined with clinical observations, patient history, and epidemiological information. The expected result is Negative. Fact Sheet for Patients: SugarRoll.be Fact Sheet for Healthcare Providers: https://www.woods-mathews.com/ This test is not yet approved or cleared by the [place] FDA and  has been authorized for detection and/or diagnosis of SARS-CoV-2 by FDA under an Emergency Use Authorization (EUA). This EUA will remain  in effect (meaning this test can be used) for the duration of the COVID-19 declaration under Section 56 4(b)(1) of the Act, 21 U.S.C. section  360bbb-3(b)(1), unless the authorization is terminated or revoked sooner. Performed at [facility], [place] [address], [place], [place] [address]      Labs: Basic Metabolic Panel: Recent Labs  Lab 10/01/19 0532 10/03/19 0526 10/04/19 0654 10/05/19 0606  NA 145 142 142 141  K 3.3* 2.5* 2.8* 3.6  CL 112* 110 111 110  CO2 24 24 26 23   GLUCOSE 102* 105* 112* 101*  BUN 11 11 9 9   CREATININE 1.26* 1.17 1.17 1.15  CALCIUM 8.9 9.0 9.1 8.7*  MG 2.0  --  2.0  --     Recent Labs  Lab 10/03/19 0526 10/05/19 0606 10/06/19 0547  WBC 4.7 5.5 5.6  HGB 8.1* 7.9* 8.1*  HCT 26.2* 25.2* 26.2*  MCV 99.2 100.8* 101.6*  PLT 433* 414* 420*   CBG: Recent Labs  Lab 10/01/19 2139 10/03/19 0724 10/03/19 1118 10/03/19 1602 10/03/19 2130  GLUCAP 91 103* 123* 85 97    Signed:  Barton Dubois MD.  [facility] 10/07/2019, 8:31 AM

## 2019-10-07 NOTE — TOC Transition Note (Signed)
Transition of Care [facility]) - CM/SW Discharge Note   Patient Details  Name: Daniel Richards MRN: [id number] Date of Birth: 01-05-1946  Transition of Care [facility]) CM/SW Contact:  Boneta Lucks, RN Phone Number: 10/07/2019, 11:34 AM   Clinical Narrative:  Patient medically ready for transport, COVID test is negative. Updated Family and called EMS, Nurse to call report. Patient going to [facility] in [place]. Updated [name] with [facility].  Patient will need home health after short stay in SNF and then resume Chemo.     Final next level of care: Skilled Nursing Facility Barriers to Discharge: Barriers Resolved   Patient Goals and CMS Choice Patient states their goals for this hospitalization and ongoing recovery are:: to go to SNF for 10 days. CMS Medicare.gov Compare Post Acute Care list provided to:: Patient Choice offered to / list presented to : Patient  Discharge Placement              Patient chooses bed at: [facility] Patient to be transferred to facility by: EMS Name of family member notified: Tammy Patient and family notified of of transfer: 10/07/19  Discharge Plan and Services      Readmission Risk Interventions Readmission Risk Prevention Plan 09/30/2019 07/30/2019 07/28/2019  Post Dischage Appt - Not Complete -  Appt Comments - [facility] MD will follow while pt at short term rehab -  Medication Screening - - Complete  Transportation Screening Complete - Complete  Medication Review ([place]) Complete - -  PCP or Specialist appointment within 3-5 days of discharge Not Complete - -  HRI or Home Care Consult Complete - -  SW Recovery Care/Counseling Consult Complete - -  Palliative Care Screening Not Complete - -  [place] Not Complete - -  Some recent data might be hidden

## 2019-10-08 ENCOUNTER — Ambulatory Visit (HOSPITAL_COMMUNITY): Payer: Medicare Other | Admitting: Hematology

## 2019-10-08 ENCOUNTER — Ambulatory Visit (HOSPITAL_COMMUNITY): Payer: Medicare Other

## 2019-10-08 ENCOUNTER — Ambulatory Visit: Payer: Medicare Other | Admitting: Urology

## 2019-10-12 ENCOUNTER — Other Ambulatory Visit: Payer: Self-pay | Admitting: Adult Health

## 2019-11-11 ENCOUNTER — Inpatient Hospital Stay (HOSPITAL_COMMUNITY): Payer: Medicare Other | Attending: Hematology

## 2019-11-11 ENCOUNTER — Ambulatory Visit (HOSPITAL_COMMUNITY): Payer: Medicare Other | Admitting: Hematology

## 2019-11-11 ENCOUNTER — Other Ambulatory Visit: Payer: Self-pay

## 2019-11-11 DIAGNOSIS — Z7901 Long term (current) use of anticoagulants: Secondary | ICD-10-CM | POA: Insufficient documentation

## 2019-11-11 DIAGNOSIS — Z87891 Personal history of nicotine dependence: Secondary | ICD-10-CM | POA: Insufficient documentation

## 2019-11-11 DIAGNOSIS — Z79899 Other long term (current) drug therapy: Secondary | ICD-10-CM | POA: Diagnosis not present

## 2019-11-11 DIAGNOSIS — E559 Vitamin D deficiency, unspecified: Secondary | ICD-10-CM | POA: Insufficient documentation

## 2019-11-11 DIAGNOSIS — E876 Hypokalemia: Secondary | ICD-10-CM | POA: Insufficient documentation

## 2019-11-11 DIAGNOSIS — R5383 Other fatigue: Secondary | ICD-10-CM | POA: Insufficient documentation

## 2019-11-11 DIAGNOSIS — Z7984 Long term (current) use of oral hypoglycemic drugs: Secondary | ICD-10-CM | POA: Insufficient documentation

## 2019-11-11 DIAGNOSIS — C8333 Diffuse large B-cell lymphoma, intra-abdominal lymph nodes: Secondary | ICD-10-CM | POA: Diagnosis not present

## 2019-11-11 DIAGNOSIS — Z5111 Encounter for antineoplastic chemotherapy: Secondary | ICD-10-CM | POA: Insufficient documentation

## 2019-11-11 DIAGNOSIS — Z86711 Personal history of pulmonary embolism: Secondary | ICD-10-CM | POA: Insufficient documentation

## 2019-11-11 DIAGNOSIS — I10 Essential (primary) hypertension: Secondary | ICD-10-CM | POA: Diagnosis not present

## 2019-11-11 DIAGNOSIS — C8513 Unspecified B-cell lymphoma, intra-abdominal lymph nodes: Secondary | ICD-10-CM

## 2019-11-11 DIAGNOSIS — K219 Gastro-esophageal reflux disease without esophagitis: Secondary | ICD-10-CM | POA: Insufficient documentation

## 2019-11-11 DIAGNOSIS — E785 Hyperlipidemia, unspecified: Secondary | ICD-10-CM | POA: Diagnosis not present

## 2019-11-11 DIAGNOSIS — E119 Type 2 diabetes mellitus without complications: Secondary | ICD-10-CM | POA: Insufficient documentation

## 2019-11-11 DIAGNOSIS — Z809 Family history of malignant neoplasm, unspecified: Secondary | ICD-10-CM | POA: Diagnosis not present

## 2019-11-11 LAB — COMPREHENSIVE METABOLIC PANEL
ALT: 13 U/L (ref 0–44)
AST: 15 U/L (ref 15–41)
Albumin: 3.4 g/dL — ABNORMAL LOW (ref 3.5–5.0)
Alkaline Phosphatase: 75 U/L (ref 38–126)
Anion gap: 7 (ref 5–15)
BUN: 24 mg/dL — ABNORMAL HIGH (ref 8–23)
CO2: 24 mmol/L (ref 22–32)
Calcium: 9.6 mg/dL (ref 8.9–10.3)
Chloride: 106 mmol/L (ref 98–111)
Creatinine, Ser: 1.32 mg/dL — ABNORMAL HIGH (ref 0.61–1.24)
GFR calc Af Amer: >60 mL/min (ref >60)
GFR calc non Af Amer: 52 mL/min — ABNORMAL LOW (ref >60)
Glucose, Bld: 124 mg/dL — ABNORMAL HIGH (ref 70–99)
Potassium: 5.1 mmol/L (ref 3.5–5.1)
Sodium: 137 mmol/L (ref 135–145)
Total Bilirubin: 0.6 mg/dL (ref 0.3–1.2)
Total Protein: 7.1 g/dL (ref 6.5–8.1)

## 2019-11-11 LAB — CBC WITH DIFFERENTIAL/PLATELET
Abs Immature Granulocytes: 0.02 10*3/uL (ref 0.00–0.07)
Basophils Absolute: 0.1 10*3/uL (ref 0.0–0.1)
Basophils Relative: 1 %
Eosinophils Absolute: 0.4 10*3/uL (ref 0.0–0.5)
Eosinophils Relative: 7 %
HCT: 35 % — ABNORMAL LOW (ref 39.0–52.0)
Hemoglobin: 10.5 g/dL — ABNORMAL LOW (ref 13.0–17.0)
Immature Granulocytes: 0 %
Lymphocytes Relative: 36 %
Lymphs Abs: 2.1 10*3/uL (ref 0.7–4.0)
MCH: 31.8 pg (ref 26.0–34.0)
MCHC: 30 g/dL (ref 30.0–36.0)
MCV: 106.1 fL — ABNORMAL HIGH (ref 80.0–100.0)
Monocytes Absolute: 0.6 10*3/uL (ref 0.1–1.0)
Monocytes Relative: 10 %
Neutro Abs: 2.7 10*3/uL (ref 1.7–7.7)
Neutrophils Relative %: 46 %
Platelets: 333 10*3/uL (ref 150–400)
RBC: 3.3 MIL/uL — ABNORMAL LOW (ref 4.22–5.81)
RDW: 15.3 % (ref 11.5–15.5)
WBC: 5.9 10*3/uL (ref 4.0–10.5)
nRBC: 0 % (ref 0.0–0.2)

## 2019-11-12 ENCOUNTER — Encounter (HOSPITAL_COMMUNITY): Payer: Self-pay | Admitting: Hematology

## 2019-11-12 ENCOUNTER — Inpatient Hospital Stay (HOSPITAL_BASED_OUTPATIENT_CLINIC_OR_DEPARTMENT_OTHER): Payer: Medicare Other | Admitting: Hematology

## 2019-11-12 ENCOUNTER — Other Ambulatory Visit: Payer: Self-pay

## 2019-11-12 VITALS — BP 129/88 | HR 72 | Temp 98.4°F | Resp 18 | Wt 166.4 lb

## 2019-11-12 DIAGNOSIS — Z95828 Presence of other vascular implants and grafts: Secondary | ICD-10-CM

## 2019-11-12 DIAGNOSIS — C8513 Unspecified B-cell lymphoma, intra-abdominal lymph nodes: Secondary | ICD-10-CM

## 2019-11-12 DIAGNOSIS — Z5111 Encounter for antineoplastic chemotherapy: Secondary | ICD-10-CM | POA: Diagnosis not present

## 2019-11-12 DIAGNOSIS — C8336 Diffuse large B-cell lymphoma, intrapelvic lymph nodes: Secondary | ICD-10-CM | POA: Diagnosis not present

## 2019-11-12 NOTE — Assessment & Plan Note (Signed)
1.  Stage IV diffuse large B-cell lymphoma: -37-monthhistory of generalized weakness, no B symptoms. -CTAP showed retroperitoneal, left pelvic, external iliac adenopathy, largest lymph node measuring 7.5 cm.  LDH was normal. -MRI of the thoracic and lumbar spine did not show any spinal involvement. - IR biopsy of the left retroperitoneal lymph node on 07/30/2019, consistent with high-grade lymphoma.  Ki-67 was 40-50%.  Positive for CD20, CD5 DM, BCL-2, BCL 6.  Negative for CD10, CD3. -PET scan on 08/07/2019 showed lymphoma within nodal stations of the neck, chest, abdomen and pelvis.  Heterogeneous foci of marrow hypermetabolism consistent with lymphomatous involvement.  Right sphenoid sinus soft tissue density and hypermetabolism. - Lumbar puncture negative for lymphoma involvement. -2D echo on 08/05/2019 shows EF 65-70%. -He was recently admitted to the hospital with hypercalcemia for couple of days and received IV hydration. - FISH staining for high-grade lymphoma is negative. -2 cycles of R mini CHOP on 08/21/2019 and 09/10/2019. -Recent admission from 09/27/2019 through 10/07/2019 with epiploic appendagitis. -I have independently reviewed CT scan from 09/27/2019 which showed significant partial treatment response with substantially decreased retroperitoneal and left pelvic adenopathy. -Patient went to rehab and has been at home since 10/21/2019.  He reportedly had C. difficile at nursing home and recovered from it.  He is accompanied by his daughter.  He is getting stronger day by day.  He is eating better.  He is able to walk with help. -He does not have any active infection at this time. -We will proceed with the cycle 3 on 11/14/2019.  He will receive Neulasta the following Monday.  We will see him back in 1 week for follow-up and checking blood counts.  At that time I plan to schedule him for a PET CT scan 3 weeks after cycle 3.  2.  Pulmonary embolism: -Diagnosed with bilateral pulmonary  embolism on 08/26/2019. -He will continue Eliquis.  He does not have any bleeding problems.  3.  Hypokalemia: -He is taking potassium 40 mEq daily. -His potassium today is 5.1.  I have told him to cut back on potassium to 20 mEq daily.  4.  Urinary problems: -He has an indwelling Foley catheter since his hospitalization from September 2020. -He has a follow-up appointment to see Dr. MAlyson Ingleson 11/18/2019.

## 2019-11-12 NOTE — Progress Notes (Signed)
[place], [place] [address]   CLINIC:  Medical Oncology/Hematology  PCP:  [name], [facility] [address] [place] [address] [phone]   REASON FOR VISIT:  Follow-up for lymphoma.  CURRENT THERAPY: R mini CHOP.   INTERVAL HISTORY:  Daniel Richards 73 y.o. male seen for follow-up of diffuse large B-cell lymphoma.  Daniel Richards was hospitalized and discharged home on 10/07/2019.  Daniel Richards went home from rehab on 10/21/2019.  Daniel Richards has been feeling very well.  Daniel Richards is getting stronger every day.  Daniel Richards is eating well.  Appetite is 100%.  Energy levels are 50%.  Is accompanied by his daughter.    REVIEW OF SYSTEMS:  Review of Systems  Neurological: Positive for dizziness.  All other systems reviewed and are negative.    PAST MEDICAL/SURGICAL HISTORY:  Past Medical History:  Diagnosis Date  . Anxiety and depression   . Cancer ([place])    lymphoma  . Diabetes mellitus 06/09/2011   pt taking metformin  . DJD of shoulder    right   . Hyperlipidemia   . Hypertension   . Migraines   . Port-A-Cath in place 08/13/2019  . Tremor   . Vitamin D deficiency    Past Surgical History:  Procedure Laterality Date  . BACK SURGERY    . KNEE SURGERY    . PORTACATH PLACEMENT Left 08/08/2019   Procedure: INSERTION PORT-A-CATH;  Surgeon: Aviva Signs, MD;  Location: [facility];  Service: General;  Laterality: Left;  . SHOULDER SURGERY       SOCIAL HISTORY:  Social History   Socioeconomic History  . Marital status: Divorced    Spouse name: Not on file  . Number of children: 2  . Years of education: 12+  . Highest education level: Not on file  Occupational History  . Not on file  Tobacco Use  . Smoking status: Never Smoker  . Smokeless tobacco: Never Used  Substance and Sexual Activity  . Alcohol use: No  . Drug use: No  . Sexual activity: Not Currently  Other Topics Concern  . Not on file  Social  History Narrative   Lives at home with son.   Caffeine use: Drinks 1 cup coffee (3 cups per week-decaf)   Social Determinants of Health   Financial Resource Strain: Medium Risk  . Difficulty of Paying Living Expenses: Somewhat hard  Food Insecurity: No Food Insecurity  . Worried About Charity fundraiser in the Last Year: Never true  . Ran Out of Food in the Last Year: Never true  Transportation Needs: No Transportation Needs  . Lack of Transportation (Medical): No  . Lack of Transportation (Non-Medical): No  Physical Activity: Inactive  . Days of Exercise per Week: 0 days  . Minutes of Exercise per Session: 0 min  Stress: No Stress Concern Present  . Feeling of Stress : Not at all  Social Connections: Moderately Isolated  . Frequency of Communication with Friends and Family: Never  . Frequency of Social Gatherings with Friends and Family: Twice a week  . Attends Religious Services: More than 4 times per year  . Active Member of Clubs or Organizations: No  . Attends Archivist Meetings: Never  . Marital Status: Divorced  Human resources officer Violence: Not At Risk  . Fear of Current or Ex-Partner: No  . Emotionally Abused: No  . Physically Abused: No  . Sexually  Abused: No    FAMILY HISTORY:  Family History  Problem Relation Age of Onset  . Cancer Sister   . Cancer Sister   . Cancer Brother   . Heart attack Father   . Cancer Brother   . Heart attack Brother   . Healthy Son   . Healthy Daughter   . Migraines Neg Hx     CURRENT MEDICATIONS:  Outpatient Encounter Medications as of 11/12/2019  Medication Sig Note  . allopurinol (ZYLOPRIM) 300 MG tablet Take 1 tablet (300 mg total) by mouth daily.   Marland Kitchen apixaban (ELIQUIS) 5 MG TABS tablet Take 1 tablet (5 mg total) by mouth 2 (two) times daily.   . calcitRIOL (ROCALTROL) 0.5 MCG capsule Take 1 capsule (0.5 mcg total) by mouth daily.   . citalopram (CELEXA) 20 MG tablet Take 1 tablet (20 mg total) by mouth at bedtime.    . folic acid (FOLVITE) 1 MG tablet Take 1 tablet (1 mg total) by mouth daily.   Marland Kitchen gabapentin (NEURONTIN) 300 MG capsule Take 2 capsules (600 mg total) by mouth 3 (three) times daily.   . INVOKANA 100 MG TABS tablet Take 100 mg by mouth daily.   Marland Kitchen K-TAB 20 MEQ TBCR Take 2 tablet  twice a day  2 tabs = 40 meq   . Lactobacillus-Inulin ([place]) CAPS Take 1 capsule by mouthRtwice a day to complete a 28 day course on 11/11/19 for GI protection   . lisinopril (ZESTRIL) 20 MG tablet Take 20 mg by mouth daily.   . metFORMIN (GLUCOPHAGE) 500 MG tablet Take 1 tablet (500 mg total) by mouth daily.   . metroNIDAZOLE (FLAGYL) 500 MG tablet Take 1 tablet by mouthEevery eight hours toRcomplete a 14 day course ending on 10/28/19   . NON FORMULARY Diet: Regular, NAS, Consistent Carbohydrate   . pantoprazole (PROTONIX) 40 MG tablet Take 1 tablet (40 mg total) by mouth 2 (two) times daily before a meal.   . potassium chloride SA (KLOR-CON) 20 MEQ tablet Take 2 tablet twice a dayR2 tabs = 40 meqV   . predniSONE (DELTASONE) 20 MG tablet Take 3 tablets (60 mg total) by mouth daily. Take on days 1-5 of chemotherapy. 08/15/2019: Will start when Daniel Richards prepare for chemotherapy on Wed 08/20/19  . primidone (MYSOLINE) 50 MG tablet Take 1.5 tablets (75 mg total) by mouth every 8 (eight) hours. (Patient taking differently: Take 100 mg by mouth 2 (two) times daily. )   . Probiotic CAPS Take 1 capsule by mouth 2 (two) times daily.   . propranolol (INDERAL) 40 MG tablet Take 1 tablet (40 mg total) by mouth 2 (two) times daily.   . sucralfate (CARAFATE) 1 g tablet Take 1 g by mouth 4 (four) times daily.   . temazepam (RESTORIL) 15 MG capsule Take 1 capsule (15 mg total) by mouth at bedtime.   . topiramate (TOPAMAX) 100 MG tablet Take 2 tablets (200 mg total) by mouth at bedtime.   Marland Kitchen acetaminophen (TYLENOL) 325 MG tablet Take 2 tablets (650 mg total) by mouth every 6 (six) hours as needed for mild pain, fever or  headache. (Patient not taking: Reported on 11/12/2019)   . CYCLOPHOSPHAMIDE IV Inject into the vein every 21 ( twenty-one) days.   Marland Kitchen DOXORUBICIN HCL IV Inject into the vein every 21 ( twenty-one) days.   Marland Kitchen GAVILAX 17 GM/SCOOP powder SMARTSIG:17 Gram(s) By Mouth Daily PRN   . lidocaine-prilocaine (EMLA) cream Apply a small amount to port  a cath site and cover with plastic wrap 1 hour prior to chemotherapy appointments (Patient not taking: Reported on 11/12/2019)   . ondansetron (ZOFRAN) 4 MG tablet Take 1 tablet (4 mg total) by mouth every 6 (six) hours as needed for nausea. (Patient not taking: Reported on 09/27/2019)   . oxyCODONE (OXY IR/ROXICODONE) 5 MG immediate release tablet Take 1 tablet (5 mg total) by mouth every 4 (four) hours as needed for moderate pain or breakthrough pain. (Patient not taking: Reported on 11/12/2019)   . polyethylene glycol (MIRALAX / GLYCOLAX) 17 g packet Take 17 g by mouth daily as needed for mild constipation. (Patient not taking: Reported on 11/12/2019)   . prochlorperazine (COMPAZINE) 10 MG tablet Take 1 tablet (10 mg total) by mouth every 6 (six) hours as needed (Nausea or vomiting). (Patient not taking: Reported on 11/12/2019)   . riTUXimab in sodium chloride 0.9 % 250 mL Inject into the vein every 21 ( twenty-one) days.   . vinCRIStine 2 mg in sodium chloride 0.9 % 50 mL Inject 2 mg into the vein every 21 ( twenty-one) days.    No facility-administered encounter medications on file as of 11/12/2019.    ALLERGIES:  Allergies  Allergen Reactions  . Vancomycin Hives    Patient received Benadryl to treat the hives     PHYSICAL EXAM:  ECOG Performance status: 2  Vitals:   11/12/19 1557  BP: 129/88  Pulse: 72  Resp: 18  Temp: 98.4 F (36.9 C)  SpO2: 98%   Filed Weights   11/12/19 1557  Weight: 166 lb 6.4 oz (75.5 kg)    Physical Exam Vitals reviewed.  Constitutional:      Appearance: Normal appearance.  Cardiovascular:     Rate and Rhythm: Normal rate  and regular rhythm.     Heart sounds: Normal heart sounds.  Pulmonary:     Effort: Pulmonary effort is normal.     Breath sounds: Normal breath sounds.  Abdominal:     General: There is no distension.     Palpations: Abdomen is soft. There is no mass.  Musculoskeletal:        General: No swelling.  Skin:    General: Skin is warm.  Neurological:     General: No focal deficit present.     Mental Status: Daniel Richards is alert and oriented to person, place, and time.  Psychiatric:        Mood and Affect: Mood normal.        Behavior: Behavior normal.   Daniel Richards has an indwelling Foley catheter.   LABORATORY DATA:  I have reviewed the labs as listed.  CBC    Component Value Date/Time   WBC 5.9 11/11/2019 1017   RBC 3.30 (L) 11/11/2019 1017   HGB 10.5 (L) 11/11/2019 1017   HCT 35.0 (L) 11/11/2019 1017   PLT 333 11/11/2019 1017   MCV 106.1 (H) 11/11/2019 1017   MCH 31.8 11/11/2019 1017   MCHC 30.0 11/11/2019 1017   RDW 15.3 11/11/2019 1017   LYMPHSABS 2.1 11/11/2019 1017   MONOABS 0.6 11/11/2019 1017   EOSABS 0.4 11/11/2019 1017   BASOSABS 0.1 11/11/2019 1017   CMP Latest Ref Rng & Units 11/11/2019 10/05/2019 10/04/2019  Glucose 70 - 99 mg/dL 124(H) 101(H) 112(H)  BUN 8 - 23 mg/dL 24(H) 9 9  Creatinine 0.61 - 1.24 mg/dL 1.32(H) 1.15 1.17  Sodium 135 - 145 mmol/L 137 141 142  Potassium 3.5 - 5.1 mmol/L 5.1 3.6 2.8(L)  Chloride  98 - 111 mmol/L 106 110 111  CO2 22 - 32 mmol/L _0 Calcium 8.9 - 10.3 mg/dL 9.6 8.7(L) 9.1  Total Protein 6.5 - 8.1 g/dL 7.1 - -  Total Bilirubin 0.3 - 1.2 mg/dL 0.6 - -  Alkaline Phos 38 - 126 U/L 75 - -  AST 15 - 41 U/L 15 - -  ALT 0 - 44 U/L 13 - -       DIAGNOSTIC IMAGING:  I have independently reviewed the scans and discussed with the patient.     ASSESSMENT & PLAN:   Diffuse large B cell lymphoma (HCC) 1.  Stage IV diffuse large B-cell lymphoma: -3-monthhistory of generalized weakness, no B symptoms. -CTAP showed retroperitoneal, left  pelvic, external iliac adenopathy, largest lymph node measuring 7.5 cm.  LDH was normal. -MRI of the thoracic and lumbar spine did not show any spinal involvement. - IR biopsy of the left retroperitoneal lymph node on 07/30/2019, consistent with high-grade lymphoma.  Ki-67 was 40-50%.  Positive for CD20, CD5 DM, BCL-2, BCL 6.  Negative for CD10, CD3. -PET scan on 08/07/2019 showed lymphoma within nodal stations of the neck, chest, abdomen and pelvis.  Heterogeneous foci of marrow hypermetabolism consistent with lymphomatous involvement.  Right sphenoid sinus soft tissue density and hypermetabolism. - Lumbar puncture negative for lymphoma involvement. -2D echo on 08/05/2019 shows EF 65-70%. -Daniel Richards was recently admitted to the hospital with hypercalcemia for couple of days and received IV hydration. - FISH staining for high-grade lymphoma is negative. -2 cycles of R mini CHOP on 08/21/2019 and 09/10/2019. -Recent admission from 09/27/2019 through 10/07/2019 with epiploic appendagitis. -I have independently reviewed CT scan from 09/27/2019 which showed significant partial treatment response with substantially decreased retroperitoneal and left pelvic adenopathy. -Patient went to rehab and has been at home since 10/21/2019.  Daniel Richards reportedly had C. difficile at nursing home and recovered from it.  Daniel Richards is accompanied by his daughter.  Daniel Richards is getting stronger day by day.  Daniel Richards is eating better.  Daniel Richards is able to walk with help. -Daniel Richards does not have any active infection at this time. -We will proceed with the cycle 3 on 11/14/2019.  Daniel Richards will receive Neulasta the following Monday.  We will see him back in 1 week for follow-up and checking blood counts.  At that time I plan to schedule him for a PET CT scan 3 weeks after cycle 3.  2.  Pulmonary embolism: -Diagnosed with bilateral pulmonary embolism on 08/26/2019. -Daniel Richards will continue Eliquis.  Daniel Richards does not have any bleeding problems.  3.  Hypokalemia: -Daniel Richards is taking potassium 40 mEq  daily. -His potassium today is 5.1.  I have told him to cut back on potassium to 20 mEq daily.  4.  Urinary problems: -Daniel Richards has an indwelling Foley catheter since his hospitalization from September 2020. -Daniel Richards has a follow-up appointment to see Dr. MAlyson Ingleson 11/18/2019.    Orders placed this encounter:  Orders Placed This Encounter  Procedures  . CBC with Differential/Platelet  . Comprehensive metabolic panel  . Magnesium  . Phosphorus  . Uric acid  . Lactate dehydrogenase   Total time spent is 40 minutes which includes review of hospitalization records, scans and face-to-face time.   SDerek Jack MD [phone]

## 2019-11-12 NOTE — Patient Instructions (Signed)
[facility] at [facility] Discharge Instructions  You were seen today by Dr. Delton Coombes. He went over your recent lab results. Continue taking eliquis and the probiotic daily. Start taking 1 tablet of potassium daily. He will see you back in 1 week for labs and follow up.   Thank you for choosing [facility] at [facility] to provide your oncology and hematology care.  To afford each patient quality time with our provider, please arrive at least 15 minutes before your scheduled appointment time.   If you have a lab appointment with the [facility] please come in thru the  Main Entrance and check in at the main information desk  You need to re-schedule your appointment should you arrive 10 or more minutes late.  We strive to give you quality time with our providers, and arriving late affects you and other patients whose appointments are after yours.  Also, if you no show three or more times for appointments you may be dismissed from the clinic at the providers discretion.     Again, thank you for choosing [facility].  Our hope is that these requests will decrease the amount of time that you wait before being seen by our physicians.       _____________________________________________________________  Should you have questions after your visit to [facility], please contact our office at [phone] between the hours of 8:00 a.m. and 4:30 p.m.  Voicemails left after 4:00 p.m. will not be returned until the following business day.  For prescription refill requests, have your pharmacy contact our office and allow 72 hours.    Cancer Center Support Programs:   > Cancer Support Group  2nd Tuesday of the month 1pm-2pm, Journey Room

## 2019-11-18 ENCOUNTER — Encounter: Payer: Self-pay | Admitting: Urology

## 2019-11-18 ENCOUNTER — Ambulatory Visit (INDEPENDENT_AMBULATORY_CARE_PROVIDER_SITE_OTHER): Payer: Medicare Other | Admitting: Urology

## 2019-11-18 ENCOUNTER — Other Ambulatory Visit: Payer: Self-pay

## 2019-11-18 ENCOUNTER — Ambulatory Visit (HOSPITAL_COMMUNITY): Payer: Medicare Other

## 2019-11-18 ENCOUNTER — Other Ambulatory Visit (HOSPITAL_COMMUNITY): Payer: Medicare Other

## 2019-11-18 VITALS — BP 88/56 | HR 69 | Temp 98.4°F | Ht 71.5 in | Wt 167.0 lb

## 2019-11-18 DIAGNOSIS — R338 Other retention of urine: Secondary | ICD-10-CM

## 2019-11-18 DIAGNOSIS — N401 Enlarged prostate with lower urinary tract symptoms: Secondary | ICD-10-CM | POA: Diagnosis not present

## 2019-11-18 MED ORDER — TAMSULOSIN HCL 0.4 MG PO CAPS: 0.4000 mg | ORAL_CAPSULE | Freq: Every day | ORAL | 11 refills | Status: DC

## 2019-11-18 NOTE — Progress Notes (Signed)
H&P  Chief Complaint: Recurrent Cystitis  History of Present Illness:   1.12.2021: This patient presents today having had recurrent urinary tract infections for which he was being treated over the course of a month (Sept 2020), but following a heavy fall while trying to urinate he was put on indwelling catheter. Shortly after, he was diagnosed with lymphoma and has been receiving treatment (he has had multiple chemo treatments and is due one this week). During a hospital visit, he also developed c.diff and between this and his recurrent UTI's he has been on many courses of abx's. 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 Prior to onset of this recent episode of cystitis, he was not having many urinary complaints aside from high urinary freq and weakened stream. His cath was last changed 2 weeks ago. At this change, he had a neg UA -- this was ordered because he had some cognitive impairment which is apparently typical for him when infected. Additionally of note, the days following chemo treatment he reports having cloudy/"slimy" looking urine. He has never been seen by a urologist prior to this and denies ever having had a prostate check. He tolerates his foley cath very well but this is apparently taxing on his family providing care for him.  His daughter [name] is his caretaker and accompanied him at today's OV.   Past Medical History:  Diagnosis Date  . Acid reflux   . Anxiety and depression   . Cancer ([place])    lymphoma  . Depression   . Diabetes ([place])   . Diabetes mellitus 06/09/2011   pt taking metformin  . DJD of shoulder    right   . Hyperlipidemia   . Hypertension   . Lymphoma ([place])   . Migraines   . Port-A-Cath in place 08/13/2019  . Pulmonary embolism ([place])   . Stomach ulcer   . Tremor   . Tremor   . Vitamin D deficiency     Past Surgical History:  Procedure Laterality Date  . BACK SURGERY    . KNEE SURGERY     . PORTACATH PLACEMENT Left 08/08/2019   Procedure: INSERTION PORT-A-CATH;  Surgeon: Aviva Signs, MD;  Location: [facility];  Service: General;  Laterality: Left;  . SHOULDER SURGERY      Home Medications:  Allergies as of 11/18/2019      Reactions   Vancomycin Hives   Patient received Benadryl to treat the hives      Medication List       Accurate as of November 18, 2019  2:09 PM. If you have any questions, ask your nurse or doctor.        acetaminophen 325 MG tablet Commonly known as: TYLENOL Take 2 tablets (650 mg total) by mouth every 6 (six) hours as needed for mild pain, fever or headache.   allopurinol 300 MG tablet Commonly known as: ZYLOPRIM Take 1 tablet (300 mg total) by mouth daily.   apixaban 5 MG Tabs tablet Commonly known as: ELIQUIS Take 1 tablet (5 mg total) by mouth 2 (two) times daily.   calcitRIOL 0.5 MCG capsule Commonly known as: ROCALTROL Take 1 capsule (0.5 mcg total) by mouth daily.   citalopram 20 MG tablet Commonly known as: CELEXA Take 1 tablet (20 mg total) by mouth at bedtime.   Culturelle Digestive Health Caps Take 1 capsule by mouthRtwice a day to complete a 28 day course on 11/11/19 for GI protection  CYCLOPHOSPHAMIDE IV Inject into the vein every 21 ( twenty-one) days.   DOXORUBICIN HCL IV Inject into the vein every 21 ( twenty-one) days.   folic acid 1 MG tablet Commonly known as: FOLVITE Take 1 tablet (1 mg total) by mouth daily.   gabapentin 300 MG capsule Commonly known as: NEURONTIN Take 2 capsules (600 mg total) by mouth 3 (three) times daily.   GaviLAX 17 GM/SCOOP powder Generic drug: polyethylene glycol powder SMARTSIG:17 Gram(s) By Mouth Daily PRN What changed: Another medication with the same name was removed. Continue taking this medication, and follow the directions you see here. Changed by: Jorja Loa, MD   Invokana 100 MG Tabs tablet Generic drug: canagliflozin Take 100 mg by mouth daily.   K-Tab 20  MEQ Tbcr Generic drug: Potassium Chloride ER Take 2 tablet  twice a day  2 tabs = 40 meq   lidocaine-prilocaine cream Commonly known as: EMLA Apply a small amount to port a cath site and cover with plastic wrap 1 hour prior to chemotherapy appointments   lisinopril 20 MG tablet Commonly known as: ZESTRIL Take 20 mg by mouth daily.   metFORMIN 500 MG tablet Commonly known as: GLUCOPHAGE Take 1 tablet (500 mg total) by mouth daily.   metroNIDAZOLE 500 MG tablet Commonly known as: FLAGYL Take 1 tablet by mouthEevery eight hours toRcomplete a 14 day course ending on 10/28/19   NON FORMULARY Diet: Regular, NAS, Consistent Carbohydrate   ondansetron 4 MG tablet Commonly known as: ZOFRAN Take 1 tablet (4 mg total) by mouth every 6 (six) hours as needed for nausea.   oxyCODONE 5 MG immediate release tablet Commonly known as: Oxy IR/ROXICODONE Take 1 tablet (5 mg total) by mouth every 4 (four) hours as needed for moderate pain or breakthrough pain.   pantoprazole 40 MG tablet Commonly known as: PROTONIX Take 1 tablet (40 mg total) by mouth 2 (two) times daily before a meal.   potassium chloride SA 20 MEQ tablet Commonly known as: KLOR-CON Take 2 tablet twice a dayR2 tabs = 40 meqV   predniSONE 20 MG tablet Commonly known as: DELTASONE Take 3 tablets (60 mg total) by mouth daily. Take on days 1-5 of chemotherapy.   primidone 50 MG tablet Commonly known as: MYSOLINE Take 1.5 tablets (75 mg total) by mouth every 8 (eight) hours. What changed:   how much to take  when to take this   Probiotic Caps Take 1 capsule by mouth 2 (two) times daily.   prochlorperazine 10 MG tablet Commonly known as: COMPAZINE Take 1 tablet (10 mg total) by mouth every 6 (six) hours as needed (Nausea or vomiting).   propranolol 40 MG tablet Commonly known as: INDERAL Take 1 tablet (40 mg total) by mouth 2 (two) times daily.   riTUXimab in sodium chloride 0.9 % 250 mL Inject into the vein  every 21 ( twenty-one) days.   sucralfate 1 g tablet Commonly known as: CARAFATE Take 1 g by mouth 4 (four) times daily.   temazepam 15 MG capsule Commonly known as: RESTORIL Take 1 capsule (15 mg total) by mouth at bedtime.   topiramate 100 MG tablet Commonly known as: TOPAMAX Take 2 tablets (200 mg total) by mouth at bedtime.   vinCRIStine 2 mg in sodium chloride 0.9 % 50 mL Inject 2 mg into the vein every 21 ( twenty-one) days.       Allergies:  Allergies  Allergen Reactions  . Vancomycin Hives    Patient received  Benadryl to treat the hives    Family History  Problem Relation Age of Onset  . Cancer Sister   . Cancer Sister   . Cancer Brother   . Heart attack Father   . Cancer Brother   . Heart attack Brother   . Healthy Son   . Healthy Daughter   . Migraines Neg Hx     Social History:  reports that he quit smoking about 30 years ago. His smoking use included cigarettes. He has a 40.00 pack-year smoking history. He has never used smokeless tobacco. He reports that he does not drink alcohol or use drugs.  ROS: A complete review of systems was performed.  All systems are negative except for pertinent findings as noted.  Physical Exam:  Vital signs in last 24 hours: BP (!) 88/56   Pulse 69   Temp 98.4 F (36.9 C)   Ht 5' 11.5" (1.816 m)   Wt 167 lb (75.8 kg)   BMI 22.97 kg/m  Constitutional:  Alert and oriented, No acute distress. In wheelchair. Legs seemingly atrophied. Mild tremor of hands/head. Cardiovascular: Regular rate  Respiratory: Normal respiratory effort GI: Abdomen is soft, nontender, nondistended, no abdominal masses. No CVAT.  Genitourinary: Normal male phallus (uncircumcised), foley catheter present, testes are descended bilaterally and non-tender and without masses, scrotum is normal in appearance without lesions or masses, perineum is normal on inspection. Prostate feels about 40 grams in size.  Lymphatic: No lymphadenopathy Neurologic:  Grossly intact, no focal deficits Psychiatric: Normal mood and affect  Laboratory Data:  No results for input(s): WBC, HGB, HCT, PLT in the last 72 hours.  No results for input(s): NA, K, CL, GLUCOSE, BUN, CALCIUM, CREATININE in the last 72 hours.  Invalid input(s): CO3   No results found for this or any previous visit (from the past 24 hour(s)). No results found for this or any previous visit (from the past 240 hour(s)).  Renal Function: No results for input(s): CREATININE in the last 168 hours. Estimated Creatinine Clearance: 51.8 mL/min (A) (by C-G formula based on SCr of 1.32 mg/dL (H)).  Radiologic Imaging: No results found.  Impression/Assessment:  His difficulties urinating may be due to enlargement of his prostate, though they just as likely could be due to his general weakness -- or both. Prostate exam normal though gland is slightly enlarged at 40 grams. He is not currently on tamsulosin -- it would be good to start him on this and have him return later for TOV. Given that he uses a wheelchair, I am not to concerned about side effects. His weakness may make it difficult for him to fxn without a catheter.   Plan:  1. Return in 1 wk for TOV.   2. Start on tamsulosin -- he was warned of potential side effects, but he uses wheelchair so this should not be prohibitive.

## 2019-11-19 ENCOUNTER — Encounter: Payer: Self-pay | Admitting: Urology

## 2019-11-20 ENCOUNTER — Encounter (HOSPITAL_COMMUNITY): Payer: Self-pay

## 2019-11-20 ENCOUNTER — Ambulatory Visit (HOSPITAL_COMMUNITY): Payer: Medicare Other

## 2019-11-20 ENCOUNTER — Other Ambulatory Visit: Payer: Self-pay

## 2019-11-20 ENCOUNTER — Inpatient Hospital Stay (HOSPITAL_COMMUNITY): Payer: Medicare Other

## 2019-11-20 VITALS — BP 99/52 | HR 68 | Temp 97.1°F | Resp 18

## 2019-11-20 DIAGNOSIS — C8513 Unspecified B-cell lymphoma, intra-abdominal lymph nodes: Secondary | ICD-10-CM

## 2019-11-20 DIAGNOSIS — Z95828 Presence of other vascular implants and grafts: Secondary | ICD-10-CM

## 2019-11-20 DIAGNOSIS — Z5111 Encounter for antineoplastic chemotherapy: Secondary | ICD-10-CM | POA: Diagnosis not present

## 2019-11-20 LAB — CBC WITH DIFFERENTIAL/PLATELET
Abs Immature Granulocytes: 0.02 10*3/uL (ref 0.00–0.07)
Basophils Absolute: 0 10*3/uL (ref 0.0–0.1)
Basophils Relative: 1 %
Eosinophils Absolute: 0.3 10*3/uL (ref 0.0–0.5)
Eosinophils Relative: 4 %
HCT: 32.4 % — ABNORMAL LOW (ref 39.0–52.0)
Hemoglobin: 9.9 g/dL — ABNORMAL LOW (ref 13.0–17.0)
Immature Granulocytes: 0 %
Lymphocytes Relative: 39 %
Lymphs Abs: 2.6 10*3/uL (ref 0.7–4.0)
MCH: 32.1 pg (ref 26.0–34.0)
MCHC: 30.6 g/dL (ref 30.0–36.0)
MCV: 105.2 fL — ABNORMAL HIGH (ref 80.0–100.0)
Monocytes Absolute: 0.7 10*3/uL (ref 0.1–1.0)
Monocytes Relative: 11 %
Neutro Abs: 2.9 10*3/uL (ref 1.7–7.7)
Neutrophils Relative %: 45 %
Platelets: 298 10*3/uL (ref 150–400)
RBC: 3.08 MIL/uL — ABNORMAL LOW (ref 4.22–5.81)
RDW: 14.6 % (ref 11.5–15.5)
WBC: 6.5 10*3/uL (ref 4.0–10.5)
nRBC: 0 % (ref 0.0–0.2)

## 2019-11-20 LAB — COMPREHENSIVE METABOLIC PANEL
ALT: 11 U/L (ref 0–44)
AST: 11 U/L — ABNORMAL LOW (ref 15–41)
Albumin: 3.4 g/dL — ABNORMAL LOW (ref 3.5–5.0)
Alkaline Phosphatase: 80 U/L (ref 38–126)
Anion gap: 7 (ref 5–15)
BUN: 35 mg/dL — ABNORMAL HIGH (ref 8–23)
CO2: 25 mmol/L (ref 22–32)
Calcium: 9.5 mg/dL (ref 8.9–10.3)
Chloride: 107 mmol/L (ref 98–111)
Creatinine, Ser: 1.56 mg/dL — ABNORMAL HIGH (ref 0.61–1.24)
GFR calc Af Amer: 50 mL/min — ABNORMAL LOW (ref >60)
GFR calc non Af Amer: 43 mL/min — ABNORMAL LOW (ref >60)
Glucose, Bld: 116 mg/dL — ABNORMAL HIGH (ref 70–99)
Potassium: 4.8 mmol/L (ref 3.5–5.1)
Sodium: 139 mmol/L (ref 135–145)
Total Bilirubin: 0.3 mg/dL (ref 0.3–1.2)
Total Protein: 6.9 g/dL (ref 6.5–8.1)

## 2019-11-20 LAB — MAGNESIUM: Magnesium: 2.2 mg/dL (ref 1.7–2.4)

## 2019-11-20 LAB — PHOSPHORUS: Phosphorus: 5.5 mg/dL — ABNORMAL HIGH (ref 2.5–4.6)

## 2019-11-20 LAB — URIC ACID: Uric Acid, Serum: 3.5 mg/dL — ABNORMAL LOW (ref 3.7–8.6)

## 2019-11-20 LAB — LACTATE DEHYDROGENASE: LDH: 99 U/L (ref 98–192)

## 2019-11-20 MED ORDER — FAMOTIDINE IN NACL 20-0.9 MG/50ML-% IV SOLN
20.0000 mg | Freq: Once | INTRAVENOUS | Status: AC
  Administered 2019-11-20: 20 mg via INTRAVENOUS
  Filled 2019-11-20: qty 50

## 2019-11-20 MED ORDER — DOXORUBICIN HCL CHEMO IV INJECTION 2 MG/ML
25.0000 mg/m2 | Freq: Once | INTRAVENOUS | Status: AC
  Administered 2019-11-20: 52 mg via INTRAVENOUS
  Filled 2019-11-20: qty 26

## 2019-11-20 MED ORDER — SODIUM CHLORIDE 0.9 % IV SOLN: Freq: Once | INTRAVENOUS | Status: AC

## 2019-11-20 MED ORDER — SODIUM CHLORIDE 0.9% FLUSH
10.0000 mL | INTRAVENOUS | Status: DC | PRN
  Administered 2019-11-20: 10 mL

## 2019-11-20 MED ORDER — VINCRISTINE SULFATE CHEMO INJECTION 1 MG/ML
1.0000 mg | Freq: Once | INTRAVENOUS | Status: AC
  Administered 2019-11-20: 1 mg via INTRAVENOUS
  Filled 2019-11-20: qty 1

## 2019-11-20 MED ORDER — DIPHENHYDRAMINE HCL 50 MG/ML IJ SOLN
50.0000 mg | Freq: Once | INTRAMUSCULAR | Status: AC
  Administered 2019-11-20: 50 mg via INTRAVENOUS
  Filled 2019-11-20: qty 1

## 2019-11-20 MED ORDER — ACETAMINOPHEN 325 MG PO TABS
650.0000 mg | ORAL_TABLET | Freq: Once | ORAL | Status: AC
  Administered 2019-11-20: 650 mg via ORAL
  Filled 2019-11-20: qty 2

## 2019-11-20 MED ORDER — SODIUM CHLORIDE 0.9 % IV SOLN
Freq: Once | INTRAVENOUS | Status: AC
  Filled 2019-11-20: qty 5

## 2019-11-20 MED ORDER — SODIUM CHLORIDE 0.9 % IV SOLN: 750.0000 mg/m2 | Freq: Once | INTRAVENOUS | Status: DC

## 2019-11-20 MED ORDER — HEPARIN SOD (PORK) LOCK FLUSH 100 UNIT/ML IV SOLN
500.0000 [IU] | Freq: Once | INTRAVENOUS | Status: AC | PRN
  Administered 2019-11-20: 500 [IU]

## 2019-11-20 MED ORDER — SODIUM CHLORIDE 0.9 % IV SOLN
490.0000 mg/m2 | Freq: Once | INTRAVENOUS | Status: AC
  Administered 2019-11-20: 1000 mg via INTRAVENOUS
  Filled 2019-11-20: qty 50

## 2019-11-20 MED ORDER — MEPERIDINE HCL 50 MG/ML IJ SOLN
25.0000 mg | Freq: Once | INTRAMUSCULAR | Status: AC
  Administered 2019-11-20: 25 mg via INTRAVENOUS
  Filled 2019-11-20: qty 1

## 2019-11-20 MED ORDER — PALONOSETRON HCL INJECTION 0.25 MG/5ML
0.2500 mg | Freq: Once | INTRAVENOUS | Status: AC
  Administered 2019-11-20: 0.25 mg via INTRAVENOUS
  Filled 2019-11-20: qty 5

## 2019-11-20 MED ORDER — SODIUM CHLORIDE 0.9 % IV SOLN
375.0000 mg/m2 | Freq: Once | INTRAVENOUS | Status: AC
  Administered 2019-11-20: 800 mg via INTRAVENOUS
  Filled 2019-11-20: qty 30

## 2019-11-20 NOTE — Progress Notes (Signed)
Daniel Richards tolerated chemo tx well without complaints or incident. Port left accessed,saline locked and flushed easily for use tomorrow. VSS upon discharge. Pt discharged via wheelchair in satisfactory condition accompanied by his son

## 2019-11-20 NOTE — Patient Instructions (Signed)
[facility] Discharge Instructions for Patients Receiving Chemotherapy   Beginning January 23rd 2017 lab work for the [facility] will be done in the  Main lab at [facility] on 1st floor. If you have a lab appointment with the [facility] please come in thru the  Main Entrance and check in at the main information desk   Today you received the following chemotherapy agents Rituxan,Adraimycin,Vincristine and Cytoxan. Follow-up as scheduled. Call clinic for any questions or concerns  To help prevent nausea and vomiting after your treatment, we encourage you to take your nausea medication   If you develop nausea and vomiting, or diarrhea that is not controlled by your medication, call the clinic.  The clinic phone number is [phone]. Office hours are Monday-Friday 8:30am-5:00pm.  BELOW ARE SYMPTOMS THAT SHOULD BE REPORTED IMMEDIATELY:  *FEVER GREATER THAN 101.0 F  *CHILLS WITH OR WITHOUT FEVER  NAUSEA AND VOMITING THAT IS NOT CONTROLLED WITH YOUR NAUSEA MEDICATION  *UNUSUAL SHORTNESS OF BREATH  *UNUSUAL BRUISING OR BLEEDING  TENDERNESS IN MOUTH AND THROAT WITH OR WITHOUT PRESENCE OF ULCERS  *URINARY PROBLEMS  *BOWEL PROBLEMS  UNUSUAL RASH Items with * indicate a potential emergency and should be followed up as soon as possible. If you have an emergency after office hours please contact your primary care physician or go to the nearest emergency department.  Please call the clinic during office hours if you have any questions or concerns.   You may also contact the Patient Navigator at [phone] should you have any questions or need assistance in obtaining follow up care.      Resources For Cancer Patients and their Caregivers ? American Cancer Society: Can assist with transportation, wigs, general needs, runs Look Good Feel Better.        (719)494-7799 ? Cancer Care: Provides financial assistance, online support groups, medication/co-pay  assistance.  1-800-813-HOPE (863)547-6688) ? [place] Assists [place] cancer patients and their families through emotional , educational and financial support.  904 831 0330 ? [place] DSS Where to apply for food stamps, Medicaid and utility assistance. 734-056-8709 ? [facility]: Transportation to medical appointments. 519-019-2966 ? Social Security Administration: May apply for disability if have a Stage IV cancer. 248 236 2272 873-291-5141 ? [employer], Disability and Transit Services: Assists with nutrition, care and transit needs. 516-672-2025

## 2019-11-20 NOTE — Progress Notes (Signed)
Patient to treatment room for chemotherapy.  Patient is taking allopurinol as directed per family.  No complaints voiced today.  Patient has foley catheter with no complaints voiced.  Family at side.  No s/s of distress noted.   All labs and vital signs reviewed with Dr. Delton Coombes for treatment today. Serum Creatinine 1.56 today.   Patient to stop lisinopril, and return tomorrow for hydration, and may receive Demerol 25 mg IV before Rituxan due to history of rigors with cycle 1 verbal order Dr. Delton Coombes. Pharmacy notified.    Reviewed treatment for today and stopping lisinopril with understanding verbalized by the patient and family.

## 2019-11-21 ENCOUNTER — Ambulatory Visit (HOSPITAL_COMMUNITY): Payer: Medicare Other

## 2019-11-21 ENCOUNTER — Inpatient Hospital Stay (HOSPITAL_COMMUNITY): Payer: Medicare Other

## 2019-11-21 VITALS — BP 100/47 | HR 62 | Temp 96.9°F | Resp 18

## 2019-11-21 DIAGNOSIS — Z5111 Encounter for antineoplastic chemotherapy: Secondary | ICD-10-CM | POA: Diagnosis not present

## 2019-11-21 DIAGNOSIS — C8513 Unspecified B-cell lymphoma, intra-abdominal lymph nodes: Secondary | ICD-10-CM

## 2019-11-21 DIAGNOSIS — Z95828 Presence of other vascular implants and grafts: Secondary | ICD-10-CM

## 2019-11-21 MED ORDER — PEGFILGRASTIM-JMDB 6 MG/0.6ML ~~LOC~~ SOSY
PREFILLED_SYRINGE | SUBCUTANEOUS | Status: AC
  Filled 2019-11-21: qty 0.6

## 2019-11-21 MED ORDER — SODIUM CHLORIDE 0.9% FLUSH
10.0000 mL | INTRAVENOUS | Status: DC | PRN
  Administered 2019-11-21: 10 mL via INTRAVENOUS

## 2019-11-21 MED ORDER — HEPARIN SOD (PORK) LOCK FLUSH 100 UNIT/ML IV SOLN
500.0000 [IU] | Freq: Once | INTRAVENOUS | Status: AC
  Administered 2019-11-21: 500 [IU] via INTRAVENOUS

## 2019-11-21 MED ORDER — SODIUM CHLORIDE 0.9 % IV SOLN: INTRAVENOUS | Status: DC

## 2019-11-21 MED ORDER — PEGFILGRASTIM-JMDB 6 MG/0.6ML ~~LOC~~ SOSY
6.0000 mg | PREFILLED_SYRINGE | Freq: Once | SUBCUTANEOUS | Status: AC
  Administered 2019-11-21: 6 mg via SUBCUTANEOUS

## 2019-11-21 NOTE — Progress Notes (Signed)
Hydration fluids and injection given per orders. Patient tolerated it well without problems. Vitals stable and discharged home from clinic via wheelchair.Follow up as scheduled.

## 2019-11-21 NOTE — Patient Instructions (Signed)
[facility]  Discharge Instructions:   _______________________________________________________________  Thank you for choosing [facility] to provide your oncology and hematology care.  To afford each patient quality time with our providers, please arrive at least 15 minutes before your scheduled appointment.  You need to re-schedule your appointment if you arrive 10 or more minutes late.  We strive to give you quality time with our providers, and arriving late affects you and other patients whose appointments are after yours.  Also, if you no show three or more times for appointments you may be dismissed from the clinic.  Again, thank you for choosing [facility] at [facility]. Our hope is that these requests will allow you access to exceptional care and in a timely manner. _______________________________________________________________  If you have questions after your visit, please contact our office at [phone] between the hours of 8:30 a.m. and 5:00 p.m. Voicemails left after 4:30 p.m. will not be returned until the following business day. _______________________________________________________________  For prescription refill requests, have your pharmacy contact our office. _______________________________________________________________  Recommendations made by the consultant and any test results will be sent to your referring physician. _______________________________________________________________ 

## 2019-11-24 NOTE — Progress Notes (Signed)
H&P  Chief Complaint: Difficulty urinating-referred by Dr Ferd Hibbs  History of Present Illness: Here for followup/TOV  1.12.2021: This patient presents today having had recurrent urinary tract infections for which he was being treated over the course of a month (Sept 2020), but following a heavy fall while trying to urinate he was put on indwelling catheter. Shortly after, he was diagnosed with lymphoma and has been receiving treatment (he has had multiple chemo treatments and is due one this week). During a hospital visit, he also developed c.diff and between this and his recurrent UTI's he has been on many courses of abx's. 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 Prior to onset of this recent episode of cystitis, he was not having many urinary complaints aside from high urinary freq and weakened stream. His cath was last changed 2 weeks ago. At this change, he had a neg UA -- this was ordered because he had some cognitive impairment which is apparently typical for him when infected. Additionally of note, the days following chemo treatment he reports having cloudy/"slimy" looking urine. He has never been seen by a urologist prior to this and denies ever having had a prostate check. He tolerates his foley cath very well but this is apparently taxing on his family providing care for him.  1.19.2021: He returns today for follow-up.  He has been on tamsulosin since his last visit.  This has not caused significant side effects.  Past Medical History:  Diagnosis Date  . Acid reflux   . Anxiety and depression   . Cancer ([place])    lymphoma  . Depression   . Diabetes ([place])   . Diabetes mellitus 06/09/2011   pt taking metformin  . DJD of shoulder    right   . Hyperlipidemia   . Hypertension   . Lymphoma ([place])   . Migraines   . Port-A-Cath in place 08/13/2019  . Pulmonary embolism ([place])   . Stomach ulcer   . Tremor   . Tremor   .  Vitamin D deficiency     Past Surgical History:  Procedure Laterality Date  . BACK SURGERY    . KNEE SURGERY    . PORTACATH PLACEMENT Left 08/08/2019   Procedure: INSERTION PORT-A-CATH;  Surgeon: Aviva Signs, MD;  Location: [facility];  Service: General;  Laterality: Left;  . SHOULDER SURGERY      Home Medications:  Allergies as of 11/25/2019      Reactions   Vancomycin Hives   Patient received Benadryl to treat the hives      Medication List       Accurate as of November 24, 2019  8:00 PM. If you have any questions, ask your nurse or doctor.        acetaminophen 325 MG tablet Commonly known as: TYLENOL Take 2 tablets (650 mg total) by mouth every 6 (six) hours as needed for mild pain, fever or headache.   allopurinol 300 MG tablet Commonly known as: ZYLOPRIM Take 1 tablet (300 mg total) by mouth daily.   apixaban 5 MG Tabs tablet Commonly known as: ELIQUIS Take 1 tablet (5 mg total) by mouth 2 (two) times daily.   calcitRIOL 0.5 MCG capsule Commonly known as: ROCALTROL Take 1 capsule (0.5 mcg total) by mouth daily.   citalopram 20 MG tablet Commonly known as: CELEXA Take 1 tablet (20 mg total) by mouth at bedtime.   Culturelle Digestive Health Caps Take 1  capsule by mouthRtwice a day to complete a 28 day course on 11/11/19 for GI protection   CYCLOPHOSPHAMIDE IV Inject into the vein every 21 ( twenty-one) days.   DOXORUBICIN HCL IV Inject into the vein every 21 ( twenty-one) days.   folic acid 1 MG tablet Commonly known as: FOLVITE Take 1 tablet (1 mg total) by mouth daily.   gabapentin 300 MG capsule Commonly known as: NEURONTIN Take 2 capsules (600 mg total) by mouth 3 (three) times daily.   GaviLAX 17 GM/SCOOP powder Generic drug: polyethylene glycol powder SMARTSIG:17 Gram(s) By Mouth Daily PRN   Invokana 100 MG Tabs tablet Generic drug: canagliflozin Take 100 mg by mouth daily.   K-Tab 20 MEQ Tbcr Generic drug: Potassium Chloride ER Take 2  tablet  twice a day  2 tabs = 40 meq   lidocaine-prilocaine cream Commonly known as: EMLA Apply a small amount to port a cath site and cover with plastic wrap 1 hour prior to chemotherapy appointments   lisinopril 20 MG tablet Commonly known as: ZESTRIL Take 20 mg by mouth daily.   metFORMIN 500 MG tablet Commonly known as: GLUCOPHAGE Take 1 tablet (500 mg total) by mouth daily.   metroNIDAZOLE 500 MG tablet Commonly known as: FLAGYL Take 1 tablet by mouthEevery eight hours toRcomplete a 14 day course ending on 10/28/19   NON FORMULARY Diet: Regular, NAS, Consistent Carbohydrate   ondansetron 4 MG tablet Commonly known as: ZOFRAN Take 1 tablet (4 mg total) by mouth every 6 (six) hours as needed for nausea.   oxyCODONE 5 MG immediate release tablet Commonly known as: Oxy IR/ROXICODONE Take 1 tablet (5 mg total) by mouth every 4 (four) hours as needed for moderate pain or breakthrough pain.   pantoprazole 40 MG tablet Commonly known as: PROTONIX Take 1 tablet (40 mg total) by mouth 2 (two) times daily before a meal.   potassium chloride SA 20 MEQ tablet Commonly known as: KLOR-CON Take 2 tablet twice a dayR2 tabs = 40 meqV   predniSONE 20 MG tablet Commonly known as: DELTASONE Take 3 tablets (60 mg total) by mouth daily. Take on days 1-5 of chemotherapy.   primidone 50 MG tablet Commonly known as: MYSOLINE Take 1.5 tablets (75 mg total) by mouth every 8 (eight) hours. What changed:   how much to take  when to take this   Probiotic Caps Take 1 capsule by mouth 2 (two) times daily.   prochlorperazine 10 MG tablet Commonly known as: COMPAZINE Take 1 tablet (10 mg total) by mouth every 6 (six) hours as needed (Nausea or vomiting).   propranolol 40 MG tablet Commonly known as: INDERAL Take 1 tablet (40 mg total) by mouth 2 (two) times daily.   riTUXimab in sodium chloride 0.9 % 250 mL Inject into the vein every 21 ( twenty-one) days.   sucralfate 1 g tablet  Commonly known as: CARAFATE Take 1 g by mouth 4 (four) times daily.   tamsulosin 0.4 MG Caps capsule Commonly known as: FLOMAX Take 1 capsule (0.4 mg total) by mouth daily after supper.   temazepam 15 MG capsule Commonly known as: RESTORIL Take 1 capsule (15 mg total) by mouth at bedtime.   topiramate 100 MG tablet Commonly known as: TOPAMAX Take 2 tablets (200 mg total) by mouth at bedtime.   vinCRIStine 2 mg in sodium chloride 0.9 % 50 mL Inject 2 mg into the vein every 21 ( twenty-one) days.       Allergies:  Allergies  Allergen Reactions  . Vancomycin Hives    Patient received Benadryl to treat the hives    Family History  Problem Relation Age of Onset  . Cancer Sister   . Cancer Sister   . Cancer Brother   . Heart attack Father   . Cancer Brother   . Heart attack Brother   . Healthy Son   . Healthy Daughter   . Migraines Neg Hx     Social History:  reports that he quit smoking about 30 years ago. His smoking use included cigarettes. He has a 40.00 pack-year smoking history. He has never used smokeless tobacco. He reports that he does not drink alcohol or use drugs.  ROS: A complete review of systems was performed.  All systems are negative except for pertinent findings as noted.  Physical Exam:  Vital signs in last 24 hours: There were no vitals taken for this visit. Constitutional:  Alert and oriented, No acute distress Cardiovascular: Regular rate  Respiratory: Normal respiratory effort GI: Abdomen is soft, nontender, nondistended, no abdominal masses. No CVAT.  Genitourinary: Normal male phallus, testes are descended bilaterally and non-tender and without masses, scrotum is normal in appearance without lesions or masses, perineum is normal on inspection. Lymphatic: No lymphadenopathy Neurologic: Grossly intact, no focal deficits Psychiatric: Normal mood and affect  Laboratory Data:  No results for input(s): WBC, HGB, HCT, PLT in the last 72 hours.   No results for input(s): NA, K, CL, GLUCOSE, BUN, CALCIUM, CREATININE in the last 72 hours.  Invalid input(s): CO3   No results found for this or any previous visit (from the past 24 hour(s)). No results found for this or any previous visit (from the past 240 hour(s)).  Renal Function: Recent Labs    11/20/19 0815  CREATININE 1.56*   Estimated Creatinine Clearance: 43.9 mL/min (A) (by C-G formula based on SCr of 1.56 mg/dL (H)).  200 mL of water was instilled in his bladder.  The patient voided 90 mL with an obstructed flow rate.  He said that this was better than his usual flow, at least before catheter was placed several months ago.  Radiologic Imaging: No results found.  Impression/Assessment:  BPH with retention, also might be secondary to the patient's overall debilitated state.  He did void about half of the instilled fluid today  Plan:  1.  I sent in a prescription for Macrobid to cover his catheter placement  2.  Continue tamsulosin  3.  I will have him drop in for a nurse visit tomorrow to check residual urine volume  4.  I will see him back in about a month to check his progress

## 2019-11-25 ENCOUNTER — Ambulatory Visit (INDEPENDENT_AMBULATORY_CARE_PROVIDER_SITE_OTHER): Payer: Medicare Other | Admitting: Urology

## 2019-11-25 ENCOUNTER — Encounter: Payer: Self-pay | Admitting: Urology

## 2019-11-25 ENCOUNTER — Other Ambulatory Visit (HOSPITAL_COMMUNITY): Payer: Medicare Other

## 2019-11-25 ENCOUNTER — Ambulatory Visit (HOSPITAL_COMMUNITY): Payer: Medicare Other | Admitting: Hematology

## 2019-11-25 ENCOUNTER — Other Ambulatory Visit: Payer: Self-pay

## 2019-11-25 VITALS — BP 113/64 | HR 72 | Temp 97.5°F | Ht 71.0 in | Wt 168.0 lb

## 2019-11-25 DIAGNOSIS — N401 Enlarged prostate with lower urinary tract symptoms: Secondary | ICD-10-CM | POA: Diagnosis not present

## 2019-11-25 DIAGNOSIS — N138 Other obstructive and reflux uropathy: Secondary | ICD-10-CM | POA: Diagnosis not present

## 2019-11-25 MED ORDER — NITROFURANTOIN MONOHYD MACRO 100 MG PO CAPS: 100.0000 mg | ORAL_CAPSULE | Freq: Two times a day (BID) | ORAL | 0 refills | Status: AC

## 2019-11-25 NOTE — Progress Notes (Signed)
Uroflow  Peak Flow: 33ml Average Flow: 57ml Voided Volume: 48ml Voiding Time: 36sec Flow Time: 36sec Time to Peak Flow: 5sec    Fill and Pull Catheter Removal  Patient is present today for a catheter removal.  Patient was cleaned and prepped in a sterile fashion 232ml of sterile water/ saline was instilled into the bladder when the patient felt the urge to urinate. 53ml of water was then drained from the balloon.  A 16FR foley cath was removed from the bladder no complications were noted .  Patient as then given some time to void on their own.  Patient can void  83ml on their own after some time.  Patient tolerated well.  Performed by [employer], Eliazer Hemphill lpn  Follow up/ Additional notes: 1 month

## 2019-11-26 ENCOUNTER — Ambulatory Visit (INDEPENDENT_AMBULATORY_CARE_PROVIDER_SITE_OTHER): Payer: Medicare Other

## 2019-11-26 ENCOUNTER — Telehealth: Payer: Self-pay | Admitting: Urology

## 2019-11-26 DIAGNOSIS — N401 Enlarged prostate with lower urinary tract symptoms: Secondary | ICD-10-CM

## 2019-11-26 DIAGNOSIS — N138 Other obstructive and reflux uropathy: Secondary | ICD-10-CM

## 2019-11-26 LAB — BLADDER SCAN AMB NON-IMAGING: Scan Result: 269.2

## 2019-11-26 NOTE — Telephone Encounter (Signed)
Pt's daughter called and had question regarding his medications. Requests a call back

## 2019-11-26 NOTE — Telephone Encounter (Signed)
Returned call to the daughter. Daughter asking if her dad should continue to antibiotic Dr. Diona Fanti had prescribed to her dad yesterday since the foley was replaced today. Instructed daughter to have patient continue all antibiotics until completed. Voiced understanding.

## 2019-11-26 NOTE — Progress Notes (Signed)
Simple Catheter Placement  Due to urinary retention patient is present today for a foley cath placement.  Patient was cleaned and prepped in a sterile fashion with betadine. A 16 FR foley catheter was inserted, urine return was noted  250 ml, urine was yellow in color.  The balloon was filled with 10cc of sterile water.  A bedside bag was attached for drainage. Patient was given instruction on how to change from one bag to another.  Patient was given instruction on proper catheter care.  Patient tolerated well, no complications were noted   Performed by: H. Kacyn Souder LPN  Additional notes/ Follow up: 1 month [place]

## 2019-11-27 ENCOUNTER — Other Ambulatory Visit: Payer: Self-pay

## 2019-11-27 ENCOUNTER — Inpatient Hospital Stay (HOSPITAL_COMMUNITY): Payer: Medicare Other

## 2019-11-27 ENCOUNTER — Inpatient Hospital Stay (HOSPITAL_BASED_OUTPATIENT_CLINIC_OR_DEPARTMENT_OTHER): Payer: Medicare Other | Admitting: Hematology

## 2019-11-27 VITALS — BP 109/52 | HR 78 | Temp 98.4°F | Resp 16 | Wt 168.8 lb

## 2019-11-27 DIAGNOSIS — C8513 Unspecified B-cell lymphoma, intra-abdominal lymph nodes: Secondary | ICD-10-CM

## 2019-11-27 DIAGNOSIS — Z5111 Encounter for antineoplastic chemotherapy: Secondary | ICD-10-CM | POA: Diagnosis not present

## 2019-11-27 DIAGNOSIS — D702 Other drug-induced agranulocytosis: Secondary | ICD-10-CM

## 2019-11-27 LAB — COMPREHENSIVE METABOLIC PANEL
ALT: 52 U/L — ABNORMAL HIGH (ref 0–44)
AST: 29 U/L (ref 15–41)
Albumin: 3.5 g/dL (ref 3.5–5.0)
Alkaline Phosphatase: 96 U/L (ref 38–126)
Anion gap: 9 (ref 5–15)
BUN: 27 mg/dL — ABNORMAL HIGH (ref 8–23)
CO2: 26 mmol/L (ref 22–32)
Calcium: 9.2 mg/dL (ref 8.9–10.3)
Chloride: 106 mmol/L (ref 98–111)
Creatinine, Ser: 1.18 mg/dL (ref 0.61–1.24)
GFR calc Af Amer: >60 mL/min (ref >60)
GFR calc non Af Amer: >60 mL/min (ref >60)
Glucose, Bld: 133 mg/dL — ABNORMAL HIGH (ref 70–99)
Potassium: 5.3 mmol/L — ABNORMAL HIGH (ref 3.5–5.1)
Sodium: 141 mmol/L (ref 135–145)
Total Bilirubin: 0.4 mg/dL (ref 0.3–1.2)
Total Protein: 6.8 g/dL (ref 6.5–8.1)

## 2019-11-27 LAB — CBC WITH DIFFERENTIAL/PLATELET
Abs Immature Granulocytes: 0.05 10*3/uL (ref 0.00–0.07)
Basophils Absolute: 0 10*3/uL (ref 0.0–0.1)
Basophils Relative: 1 %
Eosinophils Absolute: 0.1 10*3/uL (ref 0.0–0.5)
Eosinophils Relative: 7 %
HCT: 32.9 % — ABNORMAL LOW (ref 39.0–52.0)
Hemoglobin: 10 g/dL — ABNORMAL LOW (ref 13.0–17.0)
Immature Granulocytes: 3 %
Lymphocytes Relative: 62 %
Lymphs Abs: 1 10*3/uL (ref 0.7–4.0)
MCH: 31.7 pg (ref 26.0–34.0)
MCHC: 30.4 g/dL (ref 30.0–36.0)
MCV: 104.4 fL — ABNORMAL HIGH (ref 80.0–100.0)
Monocytes Absolute: 0.3 10*3/uL (ref 0.1–1.0)
Monocytes Relative: 16 %
Neutro Abs: 0.2 10*3/uL — ABNORMAL LOW (ref 1.7–7.7)
Neutrophils Relative %: 11 %
Platelets: 165 10*3/uL (ref 150–400)
RBC: 3.15 MIL/uL — ABNORMAL LOW (ref 4.22–5.81)
RDW: 14.2 % (ref 11.5–15.5)
WBC: 1.7 10*3/uL — ABNORMAL LOW (ref 4.0–10.5)
nRBC: 0 % (ref 0.0–0.2)

## 2019-11-27 LAB — URIC ACID: Uric Acid, Serum: 3.7 mg/dL (ref 3.7–8.6)

## 2019-11-27 LAB — MAGNESIUM: Magnesium: 1.9 mg/dL (ref 1.7–2.4)

## 2019-11-27 LAB — PHOSPHORUS: Phosphorus: 4.2 mg/dL (ref 2.5–4.6)

## 2019-11-27 NOTE — Patient Instructions (Addendum)
[facility] at [facility] Discharge Instructions  You were seen today by Dr. Delton Coombes. He went over your recent lab results. Stop taking the potassium daily. He will schedule your for repeat PET scan prior to your next visit. He will see you back in 2 weeks for labs and follow up.   Thank you for choosing [facility] at [facility] to provide your oncology and hematology care.  To afford each patient quality time with our provider, please arrive at least 15 minutes before your scheduled appointment time.   If you have a lab appointment with the [facility] please come in thru the  Main Entrance and check in at the main information desk  You need to re-schedule your appointment should you arrive 10 or more minutes late.  We strive to give you quality time with our providers, and arriving late affects you and other patients whose appointments are after yours.  Also, if you no show three or more times for appointments you may be dismissed from the clinic at the providers discretion.     Again, thank you for choosing [facility].  Our hope is that these requests will decrease the amount of time that you wait before being seen by our physicians.       _____________________________________________________________  Should you have questions after your visit to [facility], please contact our office at [phone] between the hours of 8:00 a.m. and 4:30 p.m.  Voicemails left after 4:00 p.m. will not be returned until the following business day.  For prescription refill requests, have your pharmacy contact our office and allow 72 hours.    Cancer Center Support Programs:   > Cancer Support Group  2nd Tuesday of the month 1pm-2pm, Journey Room

## 2019-11-27 NOTE — Progress Notes (Signed)
[place], [place] [address]   CLINIC:  Medical Oncology/Hematology  PCP:  [name], [place] [facility] [address] [place] [address] [phone]   REASON FOR VISIT:  Follow-up for lymphoma.  CURRENT THERAPY: R mini CHOP.   INTERVAL HISTORY:  Daniel Richards 75 y.o. male seen for follow-up of diffuse large B-cell lymphoma.  He received his chemotherapy on 11/20/2019.  Denied any nausea vomiting or diarrhea.  Denies any tingling or numbness in extremities.  Mild fatigue reported.  Appetite and energy levels are 25%.    REVIEW OF SYSTEMS:  Review of Systems  Constitutional: Positive for fatigue.  Psychiatric/Behavioral: Positive for sleep disturbance.  All other systems reviewed and are negative.    PAST MEDICAL/SURGICAL HISTORY:  Past Medical History:  Diagnosis Date  . Acid reflux   . Anxiety and depression   . Cancer ([place])    lymphoma  . Depression   . Diabetes ([place])   . Diabetes mellitus 06/09/2011   pt taking metformin  . DJD of shoulder    right   . Hyperlipidemia   . Hypertension   . Lymphoma ([place])   . Migraines   . Port-A-Cath in place 08/13/2019  . Pulmonary embolism ([place])   . Stomach ulcer   . Tremor   . Tremor   . Vitamin D deficiency    Past Surgical History:  Procedure Laterality Date  . BACK SURGERY    . KNEE SURGERY    . PORTACATH PLACEMENT Left 08/08/2019   Procedure: INSERTION PORT-A-CATH;  Surgeon: Aviva Signs, MD;  Location: [facility];  Service: General;  Laterality: Left;  . SHOULDER SURGERY       SOCIAL HISTORY:  Social History   Socioeconomic History  . Marital status: Divorced    Spouse name: Not on file  . Number of children: 2  . Years of education: 12+  . Highest education level: Not on file  Occupational History  . Not on file  Tobacco Use  . Smoking status: Former Smoker    Packs/day: 2.00    Years: 20.00    Pack years: 40.00   Types: Cigarettes    Quit date: 11/17/1989    Years since quitting: 30.0  . Smokeless tobacco: Never Used  Substance and Sexual Activity  . Alcohol use: No  . Drug use: No  . Sexual activity: Not Currently  Other Topics Concern  . Not on file  Social History Narrative   Lives at home with son.   Caffeine use: Drinks 1 cup coffee (3 cups per week-decaf)   Social Determinants of Health   Financial Resource Strain: Medium Risk  . Difficulty of Paying Living Expenses: Somewhat hard  Food Insecurity: No Food Insecurity  . Worried About Charity fundraiser in the Last Year: Never true  . Ran Out of Food in the Last Year: Never true  Transportation Needs: No Transportation Needs  . Lack of Transportation (Medical): No  . Lack of Transportation (Non-Medical): No  Physical Activity: Inactive  . Days of Exercise per Week: 0 days  . Minutes of Exercise per Session: 0 min  Stress: No Stress Concern Present  . Feeling of Stress : Not at all  Social Connections: Moderately Isolated  . Frequency of Communication with Friends and Family: Never  . Frequency of Social Gatherings with Friends and Family: Twice a week  . Attends Religious Services: More than  4 times per year  . Active Member of Clubs or Organizations: No  . Attends Archivist Meetings: Never  . Marital Status: Divorced  Human resources officer Violence: Not At Risk  . Fear of Current or Ex-Partner: No  . Emotionally Abused: No  . Physically Abused: No  . Sexually Abused: No    FAMILY HISTORY:  Family History  Problem Relation Age of Onset  . Cancer Sister   . Cancer Sister   . Cancer Brother   . Heart attack Father   . Cancer Brother   . Heart attack Brother   . Healthy Son   . Healthy Daughter   . Migraines Neg Hx     CURRENT MEDICATIONS:  Outpatient Encounter Medications as of 11/27/2019  Medication Sig Note  . nitrofurantoin, macrocrystal-monohydrate, (MACROBID) 100 MG capsule Take 1 capsule (100 mg total)  by mouth 2 (two) times daily for 7 days.   Marland Kitchen acetaminophen (TYLENOL) 325 MG tablet Take 2 tablets (650 mg total) by mouth every 6 (six) hours as needed for mild pain, fever or headache.   . allopurinol (ZYLOPRIM) 300 MG tablet Take 1 tablet (300 mg total) by mouth daily.   Marland Kitchen apixaban (ELIQUIS) 5 MG TABS tablet Take 1 tablet (5 mg total) by mouth 2 (two) times daily.   . calcitRIOL (ROCALTROL) 0.5 MCG capsule Take 1 capsule (0.5 mcg total) by mouth daily.   . citalopram (CELEXA) 20 MG tablet Take 1 tablet (20 mg total) by mouth at bedtime.   . CYCLOPHOSPHAMIDE IV Inject into the vein every 21 ( twenty-one) days.   Marland Kitchen DOXORUBICIN HCL IV Inject into the vein every 21 ( twenty-one) days.   . folic acid (FOLVITE) 1 MG tablet Take 1 tablet (1 mg total) by mouth daily.   Marland Kitchen gabapentin (NEURONTIN) 300 MG capsule Take 2 capsules (600 mg total) by mouth 3 (three) times daily.   Marland Kitchen GAVILAX 17 GM/SCOOP powder SMARTSIG:17 Gram(s) By Mouth Daily PRN   . INVOKANA 100 MG TABS tablet Take 100 mg by mouth daily.   Marland Kitchen K-TAB 20 MEQ TBCR Take 2 tablet  twice a day  2 tabs = 40 meq   . Lactobacillus-Inulin ([place]) CAPS Take 1 capsule by mouthRtwice a day to complete a 28 day course on 11/11/19 for GI protection   . lidocaine-prilocaine (EMLA) cream Apply a small amount to port a cath site and cover with plastic wrap 1 hour prior to chemotherapy appointments   . lisinopril (ZESTRIL) 20 MG tablet Take 20 mg by mouth daily.   . metFORMIN (GLUCOPHAGE) 500 MG tablet Take 1 tablet (500 mg total) by mouth daily.   . metroNIDAZOLE (FLAGYL) 500 MG tablet Take 1 tablet by mouthEevery eight hours toRcomplete a 14 day course ending on 10/28/19   . NON FORMULARY Diet: Regular, NAS, Consistent Carbohydrate   . ondansetron (ZOFRAN) 4 MG tablet Take 1 tablet (4 mg total) by mouth every 6 (six) hours as needed for nausea.   Marland Kitchen oxyCODONE (OXY IR/ROXICODONE) 5 MG immediate release tablet Take 1 tablet (5 mg total) by  mouth every 4 (four) hours as needed for moderate pain or breakthrough pain.   . pantoprazole (PROTONIX) 40 MG tablet Take 1 tablet (40 mg total) by mouth 2 (two) times daily before a meal.   . potassium chloride SA (KLOR-CON) 20 MEQ tablet Take 2 tablet twice a dayR2 tabs = 40 meqV   . predniSONE (DELTASONE) 20 MG tablet Take 3 tablets (60  mg total) by mouth daily. Take on days 1-5 of chemotherapy. 08/15/2019: Will start when he prepare for chemotherapy on Wed 08/20/19  . primidone (MYSOLINE) 50 MG tablet Take 1.5 tablets (75 mg total) by mouth every 8 (eight) hours. (Patient taking differently: Take 100 mg by mouth 2 (two) times daily. )   . Probiotic CAPS Take 1 capsule by mouth 2 (two) times daily.   . prochlorperazine (COMPAZINE) 10 MG tablet Take 1 tablet (10 mg total) by mouth every 6 (six) hours as needed (Nausea or vomiting).   . propranolol (INDERAL) 40 MG tablet Take 1 tablet (40 mg total) by mouth 2 (two) times daily.   . riTUXimab in sodium chloride 0.9 % 250 mL Inject into the vein every 21 ( twenty-one) days.   . sucralfate (CARAFATE) 1 g tablet Take 1 g by mouth 4 (four) times daily.   . tamsulosin (FLOMAX) 0.4 MG CAPS capsule Take 1 capsule (0.4 mg total) by mouth daily after supper.   . temazepam (RESTORIL) 15 MG capsule Take 1 capsule (15 mg total) by mouth at bedtime.   . topiramate (TOPAMAX) 100 MG tablet Take 2 tablets (200 mg total) by mouth at bedtime.   . vinCRIStine 2 mg in sodium chloride 0.9 % 50 mL Inject 2 mg into the vein every 21 ( twenty-one) days.    No facility-administered encounter medications on file as of 11/27/2019.    ALLERGIES:  Allergies  Allergen Reactions  . Vancomycin Hives    Patient received Benadryl to treat the hives     PHYSICAL EXAM:  ECOG Performance status: 2  Vitals:   11/27/19 0949  BP: (!) 109/52  Pulse: 78  Resp: 16  Temp: 98.4 F (36.9 C)  SpO2: 99%   Filed Weights   11/27/19 0949  Weight: 168 lb 12.8 oz (76.6 kg)     Physical Exam Vitals reviewed.  Constitutional:      Appearance: Normal appearance.  Cardiovascular:     Rate and Rhythm: Normal rate and regular rhythm.     Heart sounds: Normal heart sounds.  Pulmonary:     Effort: Pulmonary effort is normal.     Breath sounds: Normal breath sounds.  Abdominal:     General: There is no distension.     Palpations: Abdomen is soft. There is no mass.  Musculoskeletal:        General: No swelling.  Skin:    General: Skin is warm.  Neurological:     General: No focal deficit present.     Mental Status: He is alert and oriented to person, place, and time.  Psychiatric:        Mood and Affect: Mood normal.        Behavior: Behavior normal.   He has an indwelling Foley catheter.   LABORATORY DATA:  I have reviewed the labs as listed.  CBC    Component Value Date/Time   WBC 1.7 (L) 11/27/2019 0927   RBC 3.15 (L) 11/27/2019 0927   HGB 10.0 (L) 11/27/2019 0927   HCT 32.9 (L) 11/27/2019 0927   PLT 165 11/27/2019 0927   MCV 104.4 (H) 11/27/2019 0927   MCH 31.7 11/27/2019 0927   MCHC 30.4 11/27/2019 0927   RDW 14.2 11/27/2019 0927   LYMPHSABS 1.0 11/27/2019 0927   MONOABS 0.3 11/27/2019 0927   EOSABS 0.1 11/27/2019 0927   BASOSABS 0.0 11/27/2019 0927   CMP Latest Ref Rng & Units 11/27/2019 11/20/2019 11/11/2019  Glucose 70 - 99  mg/dL 133(H) 116(H) 124(H)  BUN 8 - 23 mg/dL 27(H) 35(H) 24(H)  Creatinine 0.61 - 1.24 mg/dL 1.18 1.56(H) 1.32(H)  Sodium 135 - 145 mmol/L 141 139 137  Potassium 3.5 - 5.1 mmol/L 5.3(H) 4.8 5.1  Chloride 98 - 111 mmol/L 106 107 106  CO2 22 - 32 mmol/L _0 Calcium 8.9 - 10.3 mg/dL 9.2 9.5 9.6  Total Protein 6.5 - 8.1 g/dL 6.8 6.9 7.1  Total Bilirubin 0.3 - 1.2 mg/dL 0.4 0.3 0.6  Alkaline Phos 38 - 126 U/L 96 80 75  AST 15 - 41 U/L 29 11(L) 15  ALT 0 - 44 U/L 52(H) 11 13       DIAGNOSTIC IMAGING:  I have independently reviewed the scans and discussed with the patient.     ASSESSMENT & PLAN:    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  Stage IV diffuse large B-cell lymphoma: -6-monthhistory of generalized weakness, no B symptoms. -CTAP showed retroperitoneal, left pelvic, external iliac adenopathy, largest lymph node measuring 7.5 cm.  LDH was normal. -MRI of the thoracic and lumbar spine did not show any spinal involvement. - IR biopsy of the left retroperitoneal lymph node on 07/30/2019, consistent with high-grade lymphoma.  Ki-67 was 40-50%.  Positive for CD20, CD5 DM, BCL-2, BCL 6.  Negative for CD10, CD3. -PET scan on 08/07/2019 showed lymphoma within nodal stations of the neck, chest, abdomen and pelvis.  Heterogeneous foci of marrow hypermetabolism consistent with lymphomatous involvement.  Right sphenoid sinus soft tissue density and hypermetabolism. - Lumbar puncture negative for lymphoma involvement. -2D echo on 08/05/2019 shows EF 65-70%. -He was recently admitted to the hospital with hypercalcemia for couple of days and received IV hydration. - FISH staining for high-grade lymphoma is negative. -2 cycles of R mini CHOP on 08/21/2019 and 09/10/2019. -Recent admission from 09/27/2019 through 10/07/2019 with epiploic appendagitis. -I have independently reviewed CT scan from 09/27/2019 which showed significant partial treatment response with substantially decreased retroperitoneal and left pelvic adenopathy. -He received cycle 3 on 11/20/2019. -Denied any chemotherapy related side effects.  We reviewed his labs.  White count is low at 1.7 with ANC of 200.  He already received Neulasta injection. -I will see him back in 2 weeks for follow-up.  I plan to repeat PET scan prior to next visit.  2.  Pulmonary embolism: -Diagnosed with bilateral pulmonary embolism on 08/26/2019. -He will continue Eliquis.  He does not have any bleeding problems.  3.  Hypokalemia: -He is taking potassium 20 mEq daily. -Potassium today is  5.3.  We will discontinue home potassium.  4.  Urinary problems: -He was given another trial of discontinuing Foley catheter. -He is back with the catheter has he was unable to void.    Orders placed this encounter:  Orders Placed This Encounter  Procedures  . NM PET Image Restag (PS) Skull Base To Thigh      SDerek Jack MD [phone]

## 2019-11-30 NOTE — Assessment & Plan Note (Signed)
1.  Stage IV diffuse large B-cell lymphoma: -81-monthhistory of generalized weakness, no B symptoms. -CTAP showed retroperitoneal, left pelvic, external iliac adenopathy, largest lymph node measuring 7.5 cm.  LDH was normal. -MRI of the thoracic and lumbar spine did not show any spinal involvement. - IR biopsy of the left retroperitoneal lymph node on 07/30/2019, consistent with high-grade lymphoma.  Ki-67 was 40-50%.  Positive for CD20, CD5 DM, BCL-2, BCL 6.  Negative for CD10, CD3. -PET scan on 08/07/2019 showed lymphoma within nodal stations of the neck, chest, abdomen and pelvis.  Heterogeneous foci of marrow hypermetabolism consistent with lymphomatous involvement.  Right sphenoid sinus soft tissue density and hypermetabolism. - Lumbar puncture negative for lymphoma involvement. -2D echo on 08/05/2019 shows EF 65-70%. -He was recently admitted to the hospital with hypercalcemia for couple of days and received IV hydration. - FISH staining for high-grade lymphoma is negative. -2 cycles of R mini CHOP on 08/21/2019 and 09/10/2019. -Recent admission from 09/27/2019 through 10/07/2019 with epiploic appendagitis. -I have independently reviewed CT scan from 09/27/2019 which showed significant partial treatment response with substantially decreased retroperitoneal and left pelvic adenopathy. -He received cycle 3 on 11/20/2019. -Denied any chemotherapy related side effects.  We reviewed his labs.  White count is low at 1.7 with ANC of 200.  He already received Neulasta injection. -I will see him back in 2 weeks for follow-up.  I plan to repeat PET scan prior to next visit.  2.  Pulmonary embolism: -Diagnosed with bilateral pulmonary embolism on 08/26/2019. -He will continue Eliquis.  He does not have any bleeding problems.  3.  Hypokalemia: -He is taking potassium 20 mEq daily. -Potassium today is 5.3.  We will discontinue home potassium.  4.  Urinary problems: -He was given another trial of  discontinuing Foley catheter. -He is back with the catheter has he was unable to void.

## 2019-12-03 ENCOUNTER — Ambulatory Visit (HOSPITAL_COMMUNITY): Payer: Medicare Other

## 2019-12-03 ENCOUNTER — Other Ambulatory Visit (HOSPITAL_COMMUNITY): Payer: Medicare Other

## 2019-12-03 ENCOUNTER — Ambulatory Visit (HOSPITAL_COMMUNITY): Payer: Medicare Other | Admitting: Hematology

## 2019-12-05 ENCOUNTER — Ambulatory Visit (HOSPITAL_COMMUNITY): Payer: Medicare Other

## 2019-12-05 ENCOUNTER — Telehealth (HOSPITAL_COMMUNITY): Payer: Self-pay

## 2019-12-05 NOTE — Telephone Encounter (Signed)
Nutrition  Patient identified on Malnutrition screening report for poor appetite and weight loss.   Chart reviewed.    Called and left message with daughter [name] (contact number listed).  RD phone number left to return call.   Ninnie Fein B. Zenia Resides, [place], [place] Registered Dietitian [phone] (pager)

## 2019-12-08 ENCOUNTER — Encounter (HOSPITAL_COMMUNITY)
Admission: RE | Admit: 2019-12-08 | Discharge: 2019-12-08 | Disposition: A | Discharge status: Home / Self Care | Payer: Medicare Other | Source: Ambulatory Visit | Attending: Hematology | Admitting: Hematology

## 2019-12-08 ENCOUNTER — Other Ambulatory Visit: Payer: Self-pay

## 2019-12-08 DIAGNOSIS — C8513 Unspecified B-cell lymphoma, intra-abdominal lymph nodes: Secondary | ICD-10-CM | POA: Diagnosis present

## 2019-12-08 MED ORDER — FLUDEOXYGLUCOSE F - 18 (FDG) INJECTION
10.6600 | Freq: Once | INTRAVENOUS | Status: AC | PRN
  Administered 2019-12-08: 10.66 via INTRAVENOUS

## 2019-12-09 ENCOUNTER — Other Ambulatory Visit (HOSPITAL_COMMUNITY): Payer: Self-pay

## 2019-12-09 DIAGNOSIS — C8513 Unspecified B-cell lymphoma, intra-abdominal lymph nodes: Secondary | ICD-10-CM

## 2019-12-10 ENCOUNTER — Inpatient Hospital Stay (HOSPITAL_COMMUNITY): Payer: Medicare Other

## 2019-12-10 ENCOUNTER — Other Ambulatory Visit: Payer: Self-pay

## 2019-12-10 ENCOUNTER — Inpatient Hospital Stay (HOSPITAL_COMMUNITY): Payer: Medicare Other | Attending: Hematology

## 2019-12-10 ENCOUNTER — Encounter (HOSPITAL_COMMUNITY): Payer: Self-pay | Admitting: Hematology

## 2019-12-10 ENCOUNTER — Inpatient Hospital Stay (HOSPITAL_BASED_OUTPATIENT_CLINIC_OR_DEPARTMENT_OTHER): Payer: Medicare Other | Admitting: Hematology

## 2019-12-10 VITALS — BP 86/45 | HR 65 | Temp 98.9°F | Resp 18 | Wt 162.2 lb

## 2019-12-10 VITALS — BP 93/48 | HR 66 | Temp 97.6°F | Resp 18

## 2019-12-10 DIAGNOSIS — F418 Other specified anxiety disorders: Secondary | ICD-10-CM | POA: Diagnosis not present

## 2019-12-10 DIAGNOSIS — Z86711 Personal history of pulmonary embolism: Secondary | ICD-10-CM | POA: Insufficient documentation

## 2019-12-10 DIAGNOSIS — E1122 Type 2 diabetes mellitus with diabetic chronic kidney disease: Secondary | ICD-10-CM | POA: Diagnosis not present

## 2019-12-10 DIAGNOSIS — I129 Hypertensive chronic kidney disease with stage 1 through stage 4 chronic kidney disease, or unspecified chronic kidney disease: Secondary | ICD-10-CM | POA: Insufficient documentation

## 2019-12-10 DIAGNOSIS — Z7901 Long term (current) use of anticoagulants: Secondary | ICD-10-CM | POA: Diagnosis not present

## 2019-12-10 DIAGNOSIS — C8338 Diffuse large B-cell lymphoma, lymph nodes of multiple sites: Secondary | ICD-10-CM | POA: Diagnosis not present

## 2019-12-10 DIAGNOSIS — K219 Gastro-esophageal reflux disease without esophagitis: Secondary | ICD-10-CM | POA: Diagnosis not present

## 2019-12-10 DIAGNOSIS — Z87891 Personal history of nicotine dependence: Secondary | ICD-10-CM | POA: Diagnosis not present

## 2019-12-10 DIAGNOSIS — C8513 Unspecified B-cell lymphoma, intra-abdominal lymph nodes: Secondary | ICD-10-CM | POA: Diagnosis not present

## 2019-12-10 DIAGNOSIS — Z5111 Encounter for antineoplastic chemotherapy: Secondary | ICD-10-CM | POA: Insufficient documentation

## 2019-12-10 DIAGNOSIS — Z7689 Persons encountering health services in other specified circumstances: Secondary | ICD-10-CM | POA: Insufficient documentation

## 2019-12-10 DIAGNOSIS — N189 Chronic kidney disease, unspecified: Secondary | ICD-10-CM | POA: Diagnosis not present

## 2019-12-10 DIAGNOSIS — E559 Vitamin D deficiency, unspecified: Secondary | ICD-10-CM | POA: Insufficient documentation

## 2019-12-10 DIAGNOSIS — Z95828 Presence of other vascular implants and grafts: Secondary | ICD-10-CM

## 2019-12-10 DIAGNOSIS — Z7984 Long term (current) use of oral hypoglycemic drugs: Secondary | ICD-10-CM | POA: Insufficient documentation

## 2019-12-10 DIAGNOSIS — C8336 Diffuse large B-cell lymphoma, intrapelvic lymph nodes: Secondary | ICD-10-CM

## 2019-12-10 DIAGNOSIS — E785 Hyperlipidemia, unspecified: Secondary | ICD-10-CM | POA: Insufficient documentation

## 2019-12-10 DIAGNOSIS — E876 Hypokalemia: Secondary | ICD-10-CM | POA: Insufficient documentation

## 2019-12-10 DIAGNOSIS — Z79899 Other long term (current) drug therapy: Secondary | ICD-10-CM | POA: Insufficient documentation

## 2019-12-10 DIAGNOSIS — I10 Essential (primary) hypertension: Secondary | ICD-10-CM | POA: Diagnosis not present

## 2019-12-10 DIAGNOSIS — R197 Diarrhea, unspecified: Secondary | ICD-10-CM | POA: Insufficient documentation

## 2019-12-10 LAB — COMPREHENSIVE METABOLIC PANEL
ALT: 5 U/L (ref 0–44)
AST: 6 U/L — ABNORMAL LOW (ref 15–41)
Albumin: 3.2 g/dL — ABNORMAL LOW (ref 3.5–5.0)
Alkaline Phosphatase: 88 U/L (ref 38–126)
Anion gap: 7 (ref 5–15)
BUN: 26 mg/dL — ABNORMAL HIGH (ref 8–23)
CO2: 27 mmol/L (ref 22–32)
Calcium: 9.7 mg/dL (ref 8.9–10.3)
Chloride: 104 mmol/L (ref 98–111)
Creatinine, Ser: 1.51 mg/dL — ABNORMAL HIGH (ref 0.61–1.24)
GFR calc Af Amer: 52 mL/min — ABNORMAL LOW (ref >60)
GFR calc non Af Amer: 45 mL/min — ABNORMAL LOW (ref >60)
Glucose, Bld: 219 mg/dL — ABNORMAL HIGH (ref 70–99)
Potassium: 4 mmol/L (ref 3.5–5.1)
Sodium: 138 mmol/L (ref 135–145)
Total Bilirubin: 0.3 mg/dL (ref 0.3–1.2)
Total Protein: 6.9 g/dL (ref 6.5–8.1)

## 2019-12-10 LAB — CBC WITH DIFFERENTIAL/PLATELET
Abs Immature Granulocytes: 0.13 10*3/uL — ABNORMAL HIGH (ref 0.00–0.07)
Basophils Absolute: 0.1 10*3/uL (ref 0.0–0.1)
Basophils Relative: 1 %
Eosinophils Absolute: 0.1 10*3/uL (ref 0.0–0.5)
Eosinophils Relative: 0 %
HCT: 30.8 % — ABNORMAL LOW (ref 39.0–52.0)
Hemoglobin: 9.5 g/dL — ABNORMAL LOW (ref 13.0–17.0)
Immature Granulocytes: 1 %
Lymphocytes Relative: 13 %
Lymphs Abs: 1.6 10*3/uL (ref 0.7–4.0)
MCH: 32.1 pg (ref 26.0–34.0)
MCHC: 30.8 g/dL (ref 30.0–36.0)
MCV: 104.1 fL — ABNORMAL HIGH (ref 80.0–100.0)
Monocytes Absolute: 0.8 10*3/uL (ref 0.1–1.0)
Monocytes Relative: 7 %
Neutro Abs: 9.6 10*3/uL — ABNORMAL HIGH (ref 1.7–7.7)
Neutrophils Relative %: 78 %
Platelets: 460 10*3/uL — ABNORMAL HIGH (ref 150–400)
RBC: 2.96 MIL/uL — ABNORMAL LOW (ref 4.22–5.81)
RDW: 14 % (ref 11.5–15.5)
WBC: 12.3 10*3/uL — ABNORMAL HIGH (ref 4.0–10.5)
nRBC: 0 % (ref 0.0–0.2)

## 2019-12-10 LAB — MAGNESIUM: Magnesium: 2 mg/dL (ref 1.7–2.4)

## 2019-12-10 LAB — PHOSPHORUS: Phosphorus: 4.9 mg/dL — ABNORMAL HIGH (ref 2.5–4.6)

## 2019-12-10 LAB — URIC ACID: Uric Acid, Serum: 3.3 mg/dL — ABNORMAL LOW (ref 3.7–8.6)

## 2019-12-10 MED ORDER — DIPHENHYDRAMINE HCL 50 MG/ML IJ SOLN
50.0000 mg | Freq: Once | INTRAMUSCULAR | Status: AC
  Administered 2019-12-10: 50 mg via INTRAVENOUS
  Filled 2019-12-10: qty 1

## 2019-12-10 MED ORDER — SODIUM CHLORIDE 0.9 % IV SOLN
Freq: Once | INTRAVENOUS | Status: AC
  Filled 2019-12-10: qty 5

## 2019-12-10 MED ORDER — VINCRISTINE SULFATE CHEMO INJECTION 1 MG/ML
1.0000 mg | Freq: Once | INTRAVENOUS | Status: AC
  Administered 2019-12-10: 1 mg via INTRAVENOUS
  Filled 2019-12-10: qty 1

## 2019-12-10 MED ORDER — SODIUM CHLORIDE 0.9 % IV SOLN
375.0000 mg/m2 | Freq: Once | INTRAVENOUS | Status: AC
  Administered 2019-12-10: 800 mg via INTRAVENOUS
  Filled 2019-12-10: qty 30

## 2019-12-10 MED ORDER — FAMOTIDINE IN NACL 20-0.9 MG/50ML-% IV SOLN
20.0000 mg | Freq: Once | INTRAVENOUS | Status: AC
  Administered 2019-12-10: 20 mg via INTRAVENOUS
  Filled 2019-12-10: qty 50

## 2019-12-10 MED ORDER — ACETAMINOPHEN 325 MG PO TABS
650.0000 mg | ORAL_TABLET | Freq: Once | ORAL | Status: AC
  Administered 2019-12-10: 09:00:00 650 mg via ORAL
  Filled 2019-12-10: qty 2

## 2019-12-10 MED ORDER — SODIUM CHLORIDE 0.9 % IV SOLN: INTRAVENOUS | Status: DC

## 2019-12-10 MED ORDER — SODIUM CHLORIDE 0.9% FLUSH
10.0000 mL | INTRAVENOUS | Status: DC | PRN
  Administered 2019-12-10: 10 mL

## 2019-12-10 MED ORDER — DOXORUBICIN HCL CHEMO IV INJECTION 2 MG/ML
25.0000 mg/m2 | Freq: Once | INTRAVENOUS | Status: AC
  Administered 2019-12-10: 52 mg via INTRAVENOUS
  Filled 2019-12-10: qty 26

## 2019-12-10 MED ORDER — SODIUM CHLORIDE 0.9 % IV SOLN: Freq: Once | INTRAVENOUS | Status: AC

## 2019-12-10 MED ORDER — SODIUM CHLORIDE 0.9 % IV SOLN
490.0000 mg/m2 | Freq: Once | INTRAVENOUS | Status: AC
  Administered 2019-12-10: 1000 mg via INTRAVENOUS
  Filled 2019-12-10: qty 50

## 2019-12-10 MED ORDER — PALONOSETRON HCL INJECTION 0.25 MG/5ML
0.2500 mg | Freq: Once | INTRAVENOUS | Status: AC
  Administered 2019-12-10: 0.25 mg via INTRAVENOUS
  Filled 2019-12-10: qty 5

## 2019-12-10 MED ORDER — HEPARIN SOD (PORK) LOCK FLUSH 100 UNIT/ML IV SOLN
500.0000 [IU] | Freq: Once | INTRAVENOUS | Status: AC | PRN
  Administered 2019-12-10: 500 [IU]

## 2019-12-10 NOTE — Progress Notes (Signed)
12/10/19  Confirmed today's dose of cyclophosphamide should be 500 mg/m2 as previous doses.  Orders modified to reflect the 500 mg/m2 dose (1000 mg)  T.O. Dr Rhys Martini, PharmD

## 2019-12-10 NOTE — Progress Notes (Signed)
Daniel Richards, [place] [address]   CLINIC:  Medical Oncology/Hematology  PCP:  [name], [place] [facility] [address] [place] [address] [phone]   REASON FOR VISIT:  Follow-up for lymphoma.  CURRENT THERAPY: R mini CHOP.   INTERVAL HISTORY:  Daniel Richards 74 y.o. male seen for follow-up prior to cycle 4 and toxicity assessment.  He finished cycle 3 on 11/20/2019.  He had PET scan done 2 days ago.  Denies any nausea, vomiting or diarrhea.  He is ambulating with a walker and with help.  Appetite and energy levels are 50%.  Numbness in the left hand fingers is stable.   REVIEW OF SYSTEMS:  Review of Systems  Gastrointestinal: Positive for diarrhea.  Neurological: Positive for numbness.  All other systems reviewed and are negative.    PAST MEDICAL/SURGICAL HISTORY:  Past Medical History:  Diagnosis Date  . Acid reflux   . Anxiety and depression   . Cancer ([place])    lymphoma  . Depression   . Diabetes ([place])   . Diabetes mellitus 06/09/2011   pt taking metformin  . DJD of shoulder    right   . Hyperlipidemia   . Hypertension   . Lymphoma ([place])   . Migraines   . Port-A-Cath in place 08/13/2019  . Pulmonary embolism ([place])   . Stomach ulcer   . Tremor   . Tremor   . Vitamin D deficiency    Past Surgical History:  Procedure Laterality Date  . BACK SURGERY    . KNEE SURGERY    . PORTACATH PLACEMENT Left 08/08/2019   Procedure: INSERTION PORT-A-CATH;  Surgeon: Aviva Signs, MD;  Location: [facility];  Service: General;  Laterality: Left;  . SHOULDER SURGERY       SOCIAL HISTORY:  Social History   Socioeconomic History  . Marital status: Divorced    Spouse name: Not on file  . Number of children: 2  . Years of education: 12+  . Highest education level: Not on file  Occupational History  . Not on file  Tobacco Use  . Smoking status: Former Smoker    Packs/day: 2.00   Years: 20.00    Pack years: 40.00    Types: Cigarettes    Quit date: 11/17/1989    Years since quitting: 30.0  . Smokeless tobacco: Never Used  Substance and Sexual Activity  . Alcohol use: No  . Drug use: No  . Sexual activity: Not Currently  Other Topics Concern  . Not on file  Social History Narrative   Lives at home with son.   Caffeine use: Drinks 1 cup coffee (3 cups per week-decaf)   Social Determinants of Health   Financial Resource Strain: Medium Risk  . Difficulty of Paying Living Expenses: Somewhat hard  Food Insecurity: No Food Insecurity  . Worried About Charity fundraiser in the Last Year: Never true  . Ran Out of Food in the Last Year: Never true  Transportation Needs: No Transportation Needs  . Lack of Transportation (Medical): No  . Lack of Transportation (Non-Medical): No  Physical Activity: Inactive  . Days of Exercise per Week: 0 days  . Minutes of Exercise per Session: 0 min  Stress: No Stress Concern Present  . Feeling of Stress : Not at all  Social Connections: Moderately Isolated  . Frequency of Communication with Friends and Family: Never  . Frequency of  Social Gatherings with Friends and Family: Twice a week  . Attends Religious Services: More than 4 times per year  . Active Member of Clubs or Organizations: No  . Attends Archivist Meetings: Never  . Marital Status: Divorced  Human resources officer Violence: Not At Risk  . Fear of Current or Ex-Partner: No  . Emotionally Abused: No  . Physically Abused: No  . Sexually Abused: No    FAMILY HISTORY:  Family History  Problem Relation Age of Onset  . Cancer Sister   . Cancer Sister   . Cancer Brother   . Heart attack Father   . Cancer Brother   . Heart attack Brother   . Healthy Son   . Healthy Daughter   . Migraines Neg Hx     CURRENT MEDICATIONS:  Outpatient Encounter Medications as of 12/10/2019  Medication Sig Note  . allopurinol (ZYLOPRIM) 300 MG tablet Take 1 tablet (300 mg  total) by mouth daily.   . calcitRIOL (ROCALTROL) 0.5 MCG capsule Take 1 capsule (0.5 mcg total) by mouth daily.   . citalopram (CELEXA) 20 MG tablet Take 1 tablet (20 mg total) by mouth at bedtime.   . CYCLOPHOSPHAMIDE IV Inject into the vein every 21 ( twenty-one) days.   Marland Kitchen DOXORUBICIN HCL IV Inject into the vein every 21 ( twenty-one) days.   . folic acid (FOLVITE) 1 MG tablet Take 1 tablet (1 mg total) by mouth daily.   Marland Kitchen gabapentin (NEURONTIN) 300 MG capsule Take 2 capsules (600 mg total) by mouth 3 (three) times daily.   . INVOKANA 100 MG TABS tablet Take 100 mg by mouth daily.   . Lactobacillus-Inulin ([place]) CAPS Take 1 capsule by mouthRtwice a day to complete a 28 day course on 11/11/19 for GI protection   . lisinopril (ZESTRIL) 20 MG tablet Take 20 mg by mouth daily.   . metFORMIN (GLUCOPHAGE) 500 MG tablet Take 1 tablet (500 mg total) by mouth daily.   . NON FORMULARY Diet: Regular, NAS, Consistent Carbohydrate   . pantoprazole (PROTONIX) 40 MG tablet Take 1 tablet (40 mg total) by mouth 2 (two) times daily before a meal.   . potassium chloride SA (KLOR-CON) 20 MEQ tablet Take 2 tablet twice a dayR2 tabs = 40 meqV   . predniSONE (DELTASONE) 20 MG tablet Take 3 tablets (60 mg total) by mouth daily. Take on days 1-5 of chemotherapy. 08/15/2019: Will start when he prepare for chemotherapy on Wed 08/20/19  . primidone (MYSOLINE) 50 MG tablet Take 1.5 tablets (75 mg total) by mouth every 8 (eight) hours. (Patient taking differently: Take 100 mg by mouth 2 (two) times daily. )   . Probiotic CAPS Take 1 capsule by mouth 2 (two) times daily.   . propranolol (INDERAL) 40 MG tablet Take 1 tablet (40 mg total) by mouth 2 (two) times daily.   . riTUXimab in sodium chloride 0.9 % 250 mL Inject into the vein every 21 ( twenty-one) days.   . sucralfate (CARAFATE) 1 g tablet Take 1 g by mouth 4 (four) times daily.   . tamsulosin (FLOMAX) 0.4 MG CAPS capsule Take 1 capsule (0.4 mg  total) by mouth daily after supper.   . temazepam (RESTORIL) 15 MG capsule Take 1 capsule (15 mg total) by mouth at bedtime.   . topiramate (TOPAMAX) 100 MG tablet Take 2 tablets (200 mg total) by mouth at bedtime.   . vinCRIStine 2 mg in sodium chloride 0.9 % 50 mL  Inject 2 mg into the vein every 21 ( twenty-one) days.   . [DISCONTINUED] K-TAB 20 MEQ TBCR Take 2 tablet  twice a day  2 tabs = 40 meq   . [DISCONTINUED] metroNIDAZOLE (FLAGYL) 500 MG tablet Take 1 tablet by mouthEevery eight hours toRcomplete a 14 day course ending on 10/28/19   . acetaminophen (TYLENOL) 325 MG tablet Take 2 tablets (650 mg total) by mouth every 6 (six) hours as needed for mild pain, fever or headache. (Patient not taking: Reported on 12/10/2019)   . GAVILAX 17 GM/SCOOP powder SMARTSIG:17 Gram(s) By Mouth Daily PRN   . HYDROcodone-acetaminophen (NORCO) 10-325 MG tablet Take 1 tablet by mouth every 6 (six) hours as needed.   . lidocaine-prilocaine (EMLA) cream Apply a small amount to port a cath site and cover with plastic wrap 1 hour prior to chemotherapy appointments (Patient not taking: Reported on 12/10/2019)   . ondansetron (ZOFRAN) 4 MG tablet Take 1 tablet (4 mg total) by mouth every 6 (six) hours as needed for nausea. (Patient not taking: Reported on 12/10/2019)   . prochlorperazine (COMPAZINE) 10 MG tablet Take 1 tablet (10 mg total) by mouth every 6 (six) hours as needed (Nausea or vomiting). (Patient not taking: Reported on 12/10/2019)   . [DISCONTINUED] apixaban (ELIQUIS) 5 MG TABS tablet Take 1 tablet (5 mg total) by mouth 2 (two) times daily.   . [DISCONTINUED] oxyCODONE (OXY IR/ROXICODONE) 5 MG immediate release tablet Take 1 tablet (5 mg total) by mouth every 4 (four) hours as needed for moderate pain or breakthrough pain. (Patient not taking: Reported on 12/10/2019)    No facility-administered encounter medications on file as of 12/10/2019.    ALLERGIES:  Allergies  Allergen Reactions  . Vancomycin Hives     Patient received Benadryl to treat the hives     PHYSICAL EXAM:  ECOG Performance status: 2  Vitals:   12/10/19 0817  BP: (!) 86/45  Pulse: 65  Resp: 18  Temp: 98.9 F (37.2 C)  SpO2: 97%   Filed Weights   12/10/19 0817  Weight: 162 lb 3.2 oz (73.6 kg)    Physical Exam Vitals reviewed.  Constitutional:      Appearance: Normal appearance.  Cardiovascular:     Rate and Rhythm: Normal rate and regular rhythm.     Heart sounds: Normal heart sounds.  Pulmonary:     Effort: Pulmonary effort is normal.     Breath sounds: Normal breath sounds.  Abdominal:     General: There is no distension.     Palpations: Abdomen is soft. There is no mass.  Musculoskeletal:        General: No swelling.  Skin:    General: Skin is warm.  Neurological:     General: No focal deficit present.     Mental Status: He is alert and oriented to person, place, and time.  Psychiatric:        Mood and Affect: Mood normal.        Behavior: Behavior normal.   He has an indwelling Foley catheter.   LABORATORY DATA:  I have reviewed the labs as listed.  CBC    Component Value Date/Time   WBC 12.3 (H) 12/10/2019 0817   RBC 2.96 (L) 12/10/2019 0817   HGB 9.5 (L) 12/10/2019 0817   HCT 30.8 (L) 12/10/2019 0817   PLT 460 (H) 12/10/2019 0817   MCV 104.1 (H) 12/10/2019 0817   MCH 32.1 12/10/2019 0817   MCHC 30.8 12/10/2019 0817  RDW 14.0 12/10/2019 0817   LYMPHSABS 1.6 12/10/2019 0817   MONOABS 0.8 12/10/2019 0817   EOSABS 0.1 12/10/2019 0817   BASOSABS 0.1 12/10/2019 0817   CMP Latest Ref Rng & Units 12/10/2019 11/27/2019 11/20/2019  Glucose 70 - 99 mg/dL 219(H) 133(H) 116(H)  BUN 8 - 23 mg/dL 26(H) 27(H) 35(H)  Creatinine 0.61 - 1.24 mg/dL 1.51(H) 1.18 1.56(H)  Sodium 135 - 145 mmol/L 138 141 139  Potassium 3.5 - 5.1 mmol/L 4.0 5.3(H) 4.8  Chloride 98 - 111 mmol/L 104 106 107  CO2 22 - 32 mmol/L 27 26 25   Calcium 8.9 - 10.3 mg/dL 9.7 9.2 9.5  Total Protein 6.5 - 8.1 g/dL 6.9 6.8 6.9   Total Bilirubin 0.3 - 1.2 mg/dL 0.3 0.4 0.3  Alkaline Phos 38 - 126 U/L 88 96 80  AST 15 - 41 U/L 6(L) 29 11(L)  ALT 0 - 44 U/L 5 52(H) 11       DIAGNOSTIC IMAGING:  I have independently reviewed the scans and discussed with the patient.     ASSESSMENT & PLAN:   Diffuse large B cell lymphoma (HCC) 1.  Stage IV diffuse large B-cell lymphoma: -47-monthhistory of generalized weakness, no B symptoms. -CTAP showed retroperitoneal, left pelvic, external iliac adenopathy, largest lymph node measuring 7.5 cm.  LDH was normal. -MRI of the thoracic and lumbar spine did not show any spinal involvement. - IR biopsy of the left retroperitoneal lymph node on 07/30/2019, consistent with high-grade lymphoma.  Ki-67 was 40-50%.  Positive for CD20, CD5 DM, BCL-2, BCL 6.  Negative for CD10, CD3. -PET scan on 08/07/2019 showed lymphoma within nodal stations of the neck, chest, abdomen and pelvis.  Heterogeneous foci of marrow hypermetabolism consistent with lymphomatous involvement.  Right sphenoid sinus soft tissue density and hypermetabolism. - Lumbar puncture negative for lymphoma involvement. -2D echo on 08/05/2019 shows EF 65-70%. -He was recently admitted to the hospital with hypercalcemia for couple of days and received IV hydration. - FISH staining for high-grade lymphoma is negative. -3 cycles of R mini CHOP from 08/21/2019 through 11/20/2019. -He did not experience any side effects from last chemotherapy. -We reviewed results of PET scan from 12/08/2019.  I reviewed images with the patient and his son.  Overall there is a very good response after 3 cycles. -We reviewed his labs.  He will proceed with his cycle 4 today.  I will reevaluate him in 3 weeks prior to cycle 5.  2.  Pulmonary embolism: -Diagnosed with bilateral pulmonary embolism on 08/26/2019. -He will continue Eliquis.  He does not have any bleeding problems.  3.  Hypokalemia: -We have discontinued home potassium.  Potassium today  is 4.0.  4.  Urinary problems: -He was given another trial of discontinuing Foley catheter. -He is back with the catheter has he was unable to void.    Orders placed this encounter:  Orders Placed This Encounter  Procedures  . CBC with Differential/Platelet  . Comprehensive metabolic panel  . Magnesium  . Lactate dehydrogenase      SDerek Jack MD [phone]

## 2019-12-10 NOTE — Progress Notes (Signed)
Patient has been assessed, vital signs and labs have been reviewed by Dr. Delton Coombes. ANC,  LFTs, and Platelets are within treatment parameters, Creatinine is slightly elevated at 1.51, please give an extra 500 mL of NS today per Dr. Delton Coombes. The patient is good to proceed with treatment at this time.

## 2019-12-10 NOTE — Patient Instructions (Addendum)
[facility] at [facility] Discharge Instructions  You were seen today by Dr. Katragadda. He went over your recent lab and scan results. He will see you back in 3 weeks for labs, treatment and follow up.   Thank you for choosing [facility] at [facility] to provide your oncology and hematology care.  To afford each patient quality time with our provider, please arrive at least 15 minutes before your scheduled appointment time.   If you have a lab appointment with the Cancer Center please come in thru the  Main Entrance and check in at the main information desk  You need to re-schedule your appointment should you arrive 10 or more minutes late.  We strive to give you quality time with our providers, and arriving late affects you and other patients whose appointments are after yours.  Also, if you no show three or more times for appointments you may be dismissed from the clinic at the providers discretion.     Again, thank you for choosing [facility].  Our hope is that these requests will decrease the amount of time that you wait before being seen by our physicians.       _____________________________________________________________  Should you have questions after your visit to  Cancer Center, please contact our office at [phone] between the hours of 8:00 a.m. and 4:30 p.m.  Voicemails left after 4:00 p.m. will not be returned until the following business day.  For prescription refill requests, have your pharmacy contact our office and allow 72 hours.    Cancer Center Support Programs:   > Cancer Support Group  2nd Tuesday of the month 1pm-2pm, Journey Room    

## 2019-12-10 NOTE — Patient Instructions (Signed)
[facility] Discharge Instructions for Patients Receiving Chemotherapy  Today you received the following chemotherapy agents   To help prevent nausea and vomiting after your treatment, we encourage you to take your nausea medication   If you develop nausea and vomiting that is not controlled by your nausea medication, call the clinic.   BELOW ARE SYMPTOMS THAT SHOULD BE REPORTED IMMEDIATELY:  *FEVER GREATER THAN 100.5 F  *CHILLS WITH OR WITHOUT FEVER  NAUSEA AND VOMITING THAT IS NOT CONTROLLED WITH YOUR NAUSEA MEDICATION  *UNUSUAL SHORTNESS OF BREATH  *UNUSUAL BRUISING OR BLEEDING  TENDERNESS IN MOUTH AND THROAT WITH OR WITHOUT PRESENCE OF ULCERS  *URINARY PROBLEMS  *BOWEL PROBLEMS  UNUSUAL RASH Items with * indicate a potential emergency and should be followed up as soon as possible.  Feel free to call the clinic should you have any questions or concerns. The clinic phone number is [phone].  Please show the CHEMO ALERT CARD at check-in to the Emergency Department and triage nurse.   

## 2019-12-10 NOTE — Assessment & Plan Note (Addendum)
1.  Stage IV diffuse large B-cell lymphoma: -21-monthhistory of generalized weakness, no B symptoms. -CTAP showed retroperitoneal, left pelvic, external iliac adenopathy, largest lymph node measuring 7.5 cm.  LDH was normal. -MRI of the thoracic and lumbar spine did not show any spinal involvement. - IR biopsy of the left retroperitoneal lymph node on 07/30/2019, consistent with high-grade lymphoma.  Ki-67 was 40-50%.  Positive for CD20, CD5 DM, BCL-2, BCL 6.  Negative for CD10, CD3. -PET scan on 08/07/2019 showed lymphoma within nodal stations of the neck, chest, abdomen and pelvis.  Heterogeneous foci of marrow hypermetabolism consistent with lymphomatous involvement.  Right sphenoid sinus soft tissue density and hypermetabolism. - Lumbar puncture negative for lymphoma involvement. -2D echo on 08/05/2019 shows EF 65-70%. -He was recently admitted to the hospital with hypercalcemia for couple of days and received IV hydration. - FISH staining for high-grade lymphoma is negative. -3 cycles of R mini CHOP from 08/21/2019 through 11/20/2019. -He did not experience any side effects from last chemotherapy. -We reviewed results of PET scan from 12/08/2019.  I reviewed images with the patient and his son.  Overall there is a very good response after 3 cycles. -We reviewed his labs.  He will proceed with his cycle 4 today.  I will reevaluate him in 3 weeks prior to cycle 5.  2.  Pulmonary embolism: -Diagnosed with bilateral pulmonary embolism on 08/26/2019. -He will continue Eliquis.  He does not have any bleeding problems.  3.  Hypokalemia: -We have discontinued home potassium.  Potassium today is 4.0.  4.  Urinary problems: -He was given another trial of discontinuing Foley catheter. -He is back with the catheter has he was unable to void.

## 2019-12-10 NOTE — Progress Notes (Signed)
Labs reviewed with MD today. Creatinine noted at 1.51. will give additional hydration per orders. Proceed with treatment per MD.  Treatment given per orders. Patient tolerated it well without problems. Vitals stable and discharged home from clinic via wheelchair. Follow up as scheduled.

## 2019-12-10 NOTE — Patient Instructions (Signed)
[facility] at [facility] Discharge Instructions  Labs drawn from portacath today   Thank you for choosing [facility] at [facility] to provide your oncology and hematology care.  To afford each patient quality time with our provider, please arrive at least 15 minutes before your scheduled appointment time.   If you have a lab appointment with the Cancer Center please come in thru the Main Entrance and check in at the main information desk.  You need to re-schedule your appointment should you arrive 10 or more minutes late.  We strive to give you quality time with our providers, and arriving late affects you and other patients whose appointments are after yours.  Also, if you no show three or more times for appointments you may be dismissed from the clinic at the providers discretion.     Again, thank you for choosing [facility].  Our hope is that these requests will decrease the amount of time that you wait before being seen by our physicians.       _____________________________________________________________  Should you have questions after your visit to [facility], please contact our office at [phone] between the hours of 8:00 a.m. and 4:30 p.m.  Voicemails left after 4:00 p.m. will not be returned until the following business day.  For prescription refill requests, have your pharmacy contact our office and allow 72 hours.    Due to Covid, you will need to wear a mask upon entering the hospital. If you do not have a mask, a mask will be given to you at the Main Entrance upon arrival. For doctor visits, patients may have 1 support person with them. For treatment visits, patients can not have anyone with them due to social distancing guidelines and our immunocompromised population.     

## 2019-12-12 ENCOUNTER — Inpatient Hospital Stay (HOSPITAL_COMMUNITY): Payer: Medicare Other

## 2019-12-12 ENCOUNTER — Other Ambulatory Visit: Payer: Self-pay

## 2019-12-12 ENCOUNTER — Encounter (HOSPITAL_COMMUNITY): Payer: Self-pay

## 2019-12-12 VITALS — BP 105/54 | HR 62 | Temp 98.0°F | Resp 18

## 2019-12-12 DIAGNOSIS — Z95828 Presence of other vascular implants and grafts: Secondary | ICD-10-CM

## 2019-12-12 DIAGNOSIS — Z5111 Encounter for antineoplastic chemotherapy: Secondary | ICD-10-CM | POA: Diagnosis not present

## 2019-12-12 DIAGNOSIS — C8513 Unspecified B-cell lymphoma, intra-abdominal lymph nodes: Secondary | ICD-10-CM

## 2019-12-12 MED ORDER — PEGFILGRASTIM-JMDB 6 MG/0.6ML ~~LOC~~ SOSY
PREFILLED_SYRINGE | SUBCUTANEOUS | Status: AC
  Filled 2019-12-12: qty 0.6

## 2019-12-12 MED ORDER — PEGFILGRASTIM-JMDB 6 MG/0.6ML ~~LOC~~ SOSY
6.0000 mg | PREFILLED_SYRINGE | Freq: Once | SUBCUTANEOUS | Status: AC
  Administered 2019-12-12: 6 mg via SUBCUTANEOUS

## 2019-12-16 ENCOUNTER — Telehealth: Payer: Self-pay | Admitting: General Practice

## 2019-12-16 NOTE — Telephone Encounter (Signed)
[facility] CSW Progress Notes  Call to patient's caregiver at request of nursing.  No answer, left VM w my contact information and encouragement to call back.  Edwyna Shell, LCSW Clinical Social Worker Phone:  [phone] Cell:  [phone]

## 2019-12-23 ENCOUNTER — Telehealth (HOSPITAL_COMMUNITY): Payer: Self-pay | Admitting: General Practice

## 2019-12-23 NOTE — Telephone Encounter (Signed)
[place] CSW Progress Notes  Call to [name], daughter, to discuss caregiver resources.  No answer, left VM.  Edwyna Shell, LCSW Clinical Social Worker Phone:  [phone] Cell:  [phone]

## 2019-12-30 ENCOUNTER — Telehealth (HOSPITAL_COMMUNITY): Payer: Self-pay | Admitting: General Practice

## 2019-12-30 NOTE — Telephone Encounter (Signed)
[facility] CSW Progress Notes  3rd attempt to reach daughter at request of infusion room RN.  No answer, left VM.  Asked daughter to call me if she would like help/resources.  Edwyna Shell, LCSW Clinical Social Worker Phone:  [phone] Cell:  [phone]

## 2019-12-31 ENCOUNTER — Other Ambulatory Visit: Payer: Self-pay

## 2019-12-31 ENCOUNTER — Ambulatory Visit: Payer: Medicare Other

## 2019-12-31 ENCOUNTER — Inpatient Hospital Stay (HOSPITAL_BASED_OUTPATIENT_CLINIC_OR_DEPARTMENT_OTHER): Payer: Medicare Other | Admitting: Hematology

## 2019-12-31 ENCOUNTER — Inpatient Hospital Stay (HOSPITAL_COMMUNITY): Payer: Medicare Other

## 2019-12-31 ENCOUNTER — Encounter (HOSPITAL_COMMUNITY): Payer: Self-pay | Admitting: Hematology

## 2019-12-31 VITALS — BP 95/54 | HR 60 | Temp 97.5°F | Resp 18

## 2019-12-31 DIAGNOSIS — C8513 Unspecified B-cell lymphoma, intra-abdominal lymph nodes: Secondary | ICD-10-CM

## 2019-12-31 DIAGNOSIS — Z95828 Presence of other vascular implants and grafts: Secondary | ICD-10-CM

## 2019-12-31 DIAGNOSIS — Z5111 Encounter for antineoplastic chemotherapy: Secondary | ICD-10-CM | POA: Diagnosis not present

## 2019-12-31 LAB — COMPREHENSIVE METABOLIC PANEL
ALT: 17 U/L (ref 0–44)
AST: 18 U/L (ref 15–41)
Albumin: 3.3 g/dL — ABNORMAL LOW (ref 3.5–5.0)
Alkaline Phosphatase: 92 U/L (ref 38–126)
Anion gap: 8 (ref 5–15)
BUN: 27 mg/dL — ABNORMAL HIGH (ref 8–23)
CO2: 26 mmol/L (ref 22–32)
Calcium: 9.4 mg/dL (ref 8.9–10.3)
Chloride: 102 mmol/L (ref 98–111)
Creatinine, Ser: 1.33 mg/dL — ABNORMAL HIGH (ref 0.61–1.24)
GFR calc Af Amer: >60 mL/min (ref >60)
GFR calc non Af Amer: 52 mL/min — ABNORMAL LOW (ref >60)
Glucose, Bld: 151 mg/dL — ABNORMAL HIGH (ref 70–99)
Potassium: 4 mmol/L (ref 3.5–5.1)
Sodium: 136 mmol/L (ref 135–145)
Total Bilirubin: 0.4 mg/dL (ref 0.3–1.2)
Total Protein: 6.8 g/dL (ref 6.5–8.1)

## 2019-12-31 LAB — CBC WITH DIFFERENTIAL/PLATELET
Abs Immature Granulocytes: 0.17 10*3/uL — ABNORMAL HIGH (ref 0.00–0.07)
Basophils Absolute: 0.1 10*3/uL (ref 0.0–0.1)
Basophils Relative: 1 %
Eosinophils Absolute: 0.1 10*3/uL (ref 0.0–0.5)
Eosinophils Relative: 1 %
HCT: 31.1 % — ABNORMAL LOW (ref 39.0–52.0)
Hemoglobin: 9.8 g/dL — ABNORMAL LOW (ref 13.0–17.0)
Immature Granulocytes: 1 %
Lymphocytes Relative: 13 %
Lymphs Abs: 1.8 10*3/uL (ref 0.7–4.0)
MCH: 32.6 pg (ref 26.0–34.0)
MCHC: 31.5 g/dL (ref 30.0–36.0)
MCV: 103.3 fL — ABNORMAL HIGH (ref 80.0–100.0)
Monocytes Absolute: 0.8 10*3/uL (ref 0.1–1.0)
Monocytes Relative: 5 %
Neutro Abs: 10.8 10*3/uL — ABNORMAL HIGH (ref 1.7–7.7)
Neutrophils Relative %: 79 %
Platelets: 361 10*3/uL (ref 150–400)
RBC: 3.01 MIL/uL — ABNORMAL LOW (ref 4.22–5.81)
RDW: 14.6 % (ref 11.5–15.5)
WBC: 13.8 10*3/uL — ABNORMAL HIGH (ref 4.0–10.5)
nRBC: 0 % (ref 0.0–0.2)

## 2019-12-31 LAB — MAGNESIUM: Magnesium: 1.8 mg/dL (ref 1.7–2.4)

## 2019-12-31 LAB — LACTATE DEHYDROGENASE: LDH: 103 U/L (ref 98–192)

## 2019-12-31 MED ORDER — VINCRISTINE SULFATE CHEMO INJECTION 1 MG/ML
1.0000 mg | Freq: Once | INTRAVENOUS | Status: AC
  Administered 2019-12-31: 1 mg via INTRAVENOUS
  Filled 2019-12-31: qty 1

## 2019-12-31 MED ORDER — PALONOSETRON HCL INJECTION 0.25 MG/5ML
INTRAVENOUS | Status: AC
  Filled 2019-12-31: qty 5

## 2019-12-31 MED ORDER — FAMOTIDINE IN NACL 20-0.9 MG/50ML-% IV SOLN
INTRAVENOUS | Status: AC
  Filled 2019-12-31: qty 50

## 2019-12-31 MED ORDER — SODIUM CHLORIDE 0.9 % IV SOLN
490.0000 mg/m2 | Freq: Once | INTRAVENOUS | Status: AC
  Administered 2019-12-31: 1000 mg via INTRAVENOUS
  Filled 2019-12-31: qty 50

## 2019-12-31 MED ORDER — SODIUM CHLORIDE 0.9 % IV SOLN
Freq: Once | INTRAVENOUS | Status: AC
  Filled 2019-12-31: qty 5

## 2019-12-31 MED ORDER — FAMOTIDINE IN NACL 20-0.9 MG/50ML-% IV SOLN
20.0000 mg | Freq: Once | INTRAVENOUS | Status: AC
  Administered 2019-12-31: 20 mg via INTRAVENOUS

## 2019-12-31 MED ORDER — SODIUM CHLORIDE 0.9% FLUSH
10.0000 mL | INTRAVENOUS | Status: DC | PRN
  Administered 2019-12-31: 10 mL

## 2019-12-31 MED ORDER — HEPARIN SOD (PORK) LOCK FLUSH 100 UNIT/ML IV SOLN
500.0000 [IU] | Freq: Once | INTRAVENOUS | Status: AC | PRN
  Administered 2019-12-31: 500 [IU]

## 2019-12-31 MED ORDER — SODIUM CHLORIDE 0.9 % IV SOLN: Freq: Once | INTRAVENOUS | Status: AC

## 2019-12-31 MED ORDER — DOXORUBICIN HCL CHEMO IV INJECTION 2 MG/ML
25.0000 mg/m2 | Freq: Once | INTRAVENOUS | Status: AC
  Administered 2019-12-31: 52 mg via INTRAVENOUS
  Filled 2019-12-31: qty 26

## 2019-12-31 MED ORDER — DIPHENHYDRAMINE HCL 50 MG/ML IJ SOLN
50.0000 mg | Freq: Once | INTRAMUSCULAR | Status: AC
  Administered 2019-12-31: 50 mg via INTRAVENOUS

## 2019-12-31 MED ORDER — PALONOSETRON HCL INJECTION 0.25 MG/5ML
0.2500 mg | Freq: Once | INTRAVENOUS | Status: AC
  Administered 2019-12-31: 0.25 mg via INTRAVENOUS

## 2019-12-31 MED ORDER — ACETAMINOPHEN 325 MG PO TABS
ORAL_TABLET | ORAL | Status: AC
  Filled 2019-12-31: qty 2

## 2019-12-31 MED ORDER — DIPHENHYDRAMINE HCL 50 MG/ML IJ SOLN
INTRAMUSCULAR | Status: AC
  Filled 2019-12-31: qty 1

## 2019-12-31 MED ORDER — SODIUM CHLORIDE 0.9 % IV SOLN
375.0000 mg/m2 | Freq: Once | INTRAVENOUS | Status: AC
  Administered 2019-12-31: 800 mg via INTRAVENOUS
  Filled 2019-12-31: qty 50

## 2019-12-31 MED ORDER — ACETAMINOPHEN 325 MG PO TABS
650.0000 mg | ORAL_TABLET | Freq: Once | ORAL | Status: AC
  Administered 2019-12-31: 650 mg via ORAL

## 2019-12-31 NOTE — Assessment & Plan Note (Signed)
1.  Stage IV diffuse large B-cell lymphoma: -4 cycles of R mini CHOP from 08/21/2019 through 12/10/2019. -PET CT scan after 3 cycles on 12/08/2019 independently reviewed by me showed very good response. -He tolerated last cycle very well. -His daughter is accompanying him today.  He is apparently not moving around very much.  He uses a walker and needs assistance.  He was apparently discharged from physical therapy at home. -I have encouraged him to be physically active and do some exercise even while he is sitting up in the chair.  I have encouraged him to be sitting in the chair most part of the day. -We reviewed his blood work.  White count is 13.8 with 79% neutrophils.  Hemoglobin is 9.8 and platelet count is normal.  LFTs are normal. -He may proceed with cycle 5 without any dose changes.  I will see him back in 3 weeks for follow-up.  We will plan to repeat another PET scan after cycle 6.  2.  Chronic kidney disease: -His creatinine is better today at 1.3.  3.  Pulmonary embolism: -Diagnosed with bilateral pulmonary embolism on 08/26/2019. -She will continue Eliquis.  Does not have any bleeding issues.  4.  Urinary problems: -He failed another trial of discontinuing Foley catheter.  He currently has catheter.

## 2019-12-31 NOTE — Patient Instructions (Signed)
[facility] Discharge Instructions for Patients Receiving Chemotherapy  Today you received the following chemotherapy agents   To help prevent nausea and vomiting after your treatment, we encourage you to take your nausea medication   If you develop nausea and vomiting that is not controlled by your nausea medication, call the clinic.   BELOW ARE SYMPTOMS THAT SHOULD BE REPORTED IMMEDIATELY:  *FEVER GREATER THAN 100.5 F  *CHILLS WITH OR WITHOUT FEVER  NAUSEA AND VOMITING THAT IS NOT CONTROLLED WITH YOUR NAUSEA MEDICATION  *UNUSUAL SHORTNESS OF BREATH  *UNUSUAL BRUISING OR BLEEDING  TENDERNESS IN MOUTH AND THROAT WITH OR WITHOUT PRESENCE OF ULCERS  *URINARY PROBLEMS  *BOWEL PROBLEMS  UNUSUAL RASH Items with * indicate a potential emergency and should be followed up as soon as possible.  Feel free to call the clinic should you have any questions or concerns. The clinic phone number is [phone].  Please show the CHEMO ALERT CARD at check-in to the Emergency Department and triage nurse.   

## 2019-12-31 NOTE — Progress Notes (Signed)
[name], [place] [address]   CLINIC:  Medical Oncology/Hematology  PCP:  [name], [place] [facility] [address] [place] [address] [phone]   REASON FOR VISIT:  Follow-up for lymphoma.  CURRENT THERAPY: R mini CHOP.   INTERVAL HISTORY:  [name] 74 y.o. male seen for follow-up prior to cycle 5 and toxicity assessment.  Appetite is 100%.  Energy levels are 25%.  No pain reported.  Denied any chemotherapy related side effects including nausea vomiting or diarrhea.  Denies any tingling or numbness in extremities.  Continues to have Foley catheter.  He is accompanied by his daughter today.  Reportedly not moving much at home.   REVIEW OF SYSTEMS:  Review of Systems  Gastrointestinal: Positive for constipation.  All other systems reviewed and are negative.    PAST MEDICAL/SURGICAL HISTORY:  Past Medical History:  Diagnosis Date  . Acid reflux   . Anxiety and depression   . Cancer ([place])    lymphoma  . Depression   . Diabetes ([place])   . Diabetes mellitus 06/09/2011   pt taking metformin  . DJD of shoulder    right   . Hyperlipidemia   . Hypertension   . Lymphoma ([place])   . Migraines   . Port-A-Cath in place 08/13/2019  . Pulmonary embolism ([place])   . Stomach ulcer   . Tremor   . Tremor   . Vitamin D deficiency    Past Surgical History:  Procedure Laterality Date  . BACK SURGERY    . KNEE SURGERY    . PORTACATH PLACEMENT Left 08/08/2019   Procedure: INSERTION PORT-A-CATH;  Surgeon: Aviva Signs, MD;  Location: [facility];  Service: General;  Laterality: Left;  . SHOULDER SURGERY       SOCIAL HISTORY:  Social History   Socioeconomic History  . Marital status: Divorced    Spouse name: Not on file  . Number of children: 2  . Years of education: 12+  . Highest education level: Not on file  Occupational History  . Not on file  Tobacco Use  . Smoking status: Former  Smoker    Packs/day: 2.00    Years: 20.00    Pack years: 40.00    Types: Cigarettes    Quit date: 11/17/1989    Years since quitting: 30.1  . Smokeless tobacco: Never Used  Substance and Sexual Activity  . Alcohol use: No  . Drug use: No  . Sexual activity: Not Currently  Other Topics Concern  . Not on file  Social History Narrative   Lives at home with son.   Caffeine use: Drinks 1 cup coffee (3 cups per week-decaf)   Social Determinants of Health   Financial Resource Strain: Medium Risk  . Difficulty of Paying Living Expenses: Somewhat hard  Food Insecurity: No Food Insecurity  . Worried About Charity fundraiser in the Last Year: Never true  . Ran Out of Food in the Last Year: Never true  Transportation Needs: No Transportation Needs  . Lack of Transportation (Medical): No  . Lack of Transportation (Non-Medical): No  Physical Activity: Inactive  . Days of Exercise per Week: 0 days  . Minutes of Exercise per Session: 0 min  Stress: No Stress Concern Present  . Feeling of Stress : Not at all  Social Connections: Moderately Isolated  . Frequency of Communication with Friends and Family: Never  .  Frequency of Social Gatherings with Friends and Family: Twice a week  . Attends Religious Services: More than 4 times per year  . Active Member of Clubs or Organizations: No  . Attends Archivist Meetings: Never  . Marital Status: Divorced  Human resources officer Violence: Not At Risk  . Fear of Current or Ex-Partner: No  . Emotionally Abused: No  . Physically Abused: No  . Sexually Abused: No    FAMILY HISTORY:  Family History  Problem Relation Age of Onset  . Cancer Sister   . Cancer Sister   . Cancer Brother   . Heart attack Father   . Cancer Brother   . Heart attack Brother   . Healthy Son   . Healthy Daughter   . Migraines Neg Hx     CURRENT MEDICATIONS:  Outpatient Encounter Medications as of 12/31/2019  Medication Sig Note  . allopurinol (ZYLOPRIM) 300  MG tablet Take 1 tablet (300 mg total) by mouth daily.   . calcitRIOL (ROCALTROL) 0.5 MCG capsule Take 1 capsule (0.5 mcg total) by mouth daily.   . citalopram (CELEXA) 20 MG tablet Take 1 tablet (20 mg total) by mouth at bedtime.   . CYCLOPHOSPHAMIDE IV Inject into the vein every 21 ( twenty-one) days.   Marland Kitchen DOXORUBICIN HCL IV Inject into the vein every 21 ( twenty-one) days.   Marland Kitchen ELIQUIS 5 MG TABS tablet Take 5 mg by mouth 2 (two) times daily.   . folic acid (FOLVITE) 1 MG tablet Take 1 tablet (1 mg total) by mouth daily.   Marland Kitchen gabapentin (NEURONTIN) 300 MG capsule Take 2 capsules (600 mg total) by mouth 3 (three) times daily.   Marland Kitchen lisinopril (ZESTRIL) 20 MG tablet Take 20 mg by mouth daily.   . metFORMIN (GLUCOPHAGE) 500 MG tablet Take 1 tablet (500 mg total) by mouth daily.   . pantoprazole (PROTONIX) 40 MG tablet Take 1 tablet (40 mg total) by mouth 2 (two) times daily before a meal.   . predniSONE (DELTASONE) 20 MG tablet Take 3 tablets (60 mg total) by mouth daily. Take on days 1-5 of chemotherapy. 08/15/2019: Will start when he prepare for chemotherapy on Wed 08/20/19  . primidone (MYSOLINE) 50 MG tablet Take 1.5 tablets (75 mg total) by mouth every 8 (eight) hours. (Patient taking differently: Take 100 mg by mouth 2 (two) times daily. )   . Probiotic CAPS Take 1 capsule by mouth 2 (two) times daily.   . propranolol (INDERAL) 40 MG tablet Take 1 tablet (40 mg total) by mouth 2 (two) times daily.   . riTUXimab in sodium chloride 0.9 % 250 mL Inject into the vein every 21 ( twenty-one) days.   . tamsulosin (FLOMAX) 0.4 MG CAPS capsule Take 1 capsule (0.4 mg total) by mouth daily after supper.   . temazepam (RESTORIL) 15 MG capsule Take 1 capsule (15 mg total) by mouth at bedtime.   . topiramate (TOPAMAX) 100 MG tablet Take 2 tablets (200 mg total) by mouth at bedtime.   . vinCRIStine 2 mg in sodium chloride 0.9 % 50 mL Inject 2 mg into the vein every 21 ( twenty-one) days.   . [DISCONTINUED]  Lactobacillus-Inulin ([place]) CAPS Take 1 capsule by mouthRtwice a day to complete a 28 day course on 11/11/19 for GI protection   . acetaminophen (TYLENOL) 325 MG tablet Take 2 tablets (650 mg total) by mouth every 6 (six) hours as needed for mild pain, fever or headache. (Patient not  taking: Reported on 12/10/2019)   . GAVILAX 17 GM/SCOOP powder SMARTSIG:17 Gram(s) By Mouth Daily PRN   . HYDROcodone-acetaminophen (NORCO) 10-325 MG tablet Take 1 tablet by mouth every 6 (six) hours as needed.   . lidocaine-prilocaine (EMLA) cream Apply a small amount to port a cath site and cover with plastic wrap 1 hour prior to chemotherapy appointments (Patient not taking: Reported on 12/10/2019)   . ondansetron (ZOFRAN) 4 MG tablet Take 1 tablet (4 mg total) by mouth every 6 (six) hours as needed for nausea. (Patient not taking: Reported on 12/10/2019)   . prochlorperazine (COMPAZINE) 10 MG tablet Take 1 tablet (10 mg total) by mouth every 6 (six) hours as needed (Nausea or vomiting). (Patient not taking: Reported on 12/10/2019)   . [DISCONTINUED] INVOKANA 100 MG TABS tablet Take 100 mg by mouth daily.   . [DISCONTINUED] NON FORMULARY Diet: Regular, NAS, Consistent Carbohydrate   . [DISCONTINUED] potassium chloride SA (KLOR-CON) 20 MEQ tablet Take 2 tablet twice a dayR2 tabs = 40 meqV   . [DISCONTINUED] sucralfate (CARAFATE) 1 g tablet Take 1 g by mouth 4 (four) times daily.    No facility-administered encounter medications on file as of 12/31/2019.    ALLERGIES:  Allergies  Allergen Reactions  . Vancomycin Hives    Patient received Benadryl to treat the hives     PHYSICAL EXAM:  ECOG Performance status: 2  Vitals:   12/31/19 0824  BP: (!) 122/53  Pulse: (!) 59  Resp: 18  Temp: (!) 97.1 F (36.2 C)  SpO2: 97%   Filed Weights   12/31/19 0824  Weight: 164 lb (74.4 kg)    Physical Exam Vitals reviewed.  Constitutional:      Appearance: Normal appearance.  Cardiovascular:      Rate and Rhythm: Normal rate and regular rhythm.     Heart sounds: Normal heart sounds.  Pulmonary:     Effort: Pulmonary effort is normal.     Breath sounds: Normal breath sounds.  Abdominal:     General: There is no distension.     Palpations: Abdomen is soft. There is no mass.  Musculoskeletal:        General: No swelling.  Skin:    General: Skin is warm.  Neurological:     General: No focal deficit present.     Mental Status: He is alert and oriented to person, place, and time.  Psychiatric:        Mood and Affect: Mood normal.        Behavior: Behavior normal.   He has an indwelling Foley catheter.   LABORATORY DATA:  I have reviewed the labs as listed.  CBC    Component Value Date/Time   WBC 13.8 (H) 12/31/2019 0819   RBC 3.01 (L) 12/31/2019 0819   HGB 9.8 (L) 12/31/2019 0819   HCT 31.1 (L) 12/31/2019 0819   PLT 361 12/31/2019 0819   MCV 103.3 (H) 12/31/2019 0819   MCH 32.6 12/31/2019 0819   MCHC 31.5 12/31/2019 0819   RDW 14.6 12/31/2019 0819   LYMPHSABS 1.8 12/31/2019 0819   MONOABS 0.8 12/31/2019 0819   EOSABS 0.1 12/31/2019 0819   BASOSABS 0.1 12/31/2019 0819   CMP Latest Ref Rng & Units 12/31/2019 12/10/2019 11/27/2019  Glucose 70 - 99 mg/dL 151(H) 219(H) 133(H)  BUN 8 - 23 mg/dL 27(H) 26(H) 27(H)  Creatinine 0.61 - 1.24 mg/dL 1.33(H) 1.51(H) 1.18  Sodium 135 - 145 mmol/L 136 138 141  Potassium 3.5 - 5.1  mmol/L 4.0 4.0 5.3(H)  Chloride 98 - 111 mmol/L 102 104 106  CO2 22 - 32 mmol/L 26 27 26   Calcium 8.9 - 10.3 mg/dL 9.4 9.7 9.2  Total Protein 6.5 - 8.1 g/dL 6.8 6.9 6.8  Total Bilirubin 0.3 - 1.2 mg/dL 0.4 0.3 0.4  Alkaline Phos 38 - 126 U/L 92 88 96  AST 15 - 41 U/L 18 6(L) 29  ALT 0 - 44 U/L 17 5 52(H)       DIAGNOSTIC IMAGING:  I have independently reviewed scans and discussed with the patient.     ASSESSMENT & PLAN:   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  Stage IV diffuse  large B-cell lymphoma: -4 cycles of R mini CHOP from 08/21/2019 through 12/10/2019. -PET CT scan after 3 cycles on 12/08/2019 independently reviewed by me showed very good response. -He tolerated last cycle very well. -His daughter is accompanying him today.  He is apparently not moving around very much.  He uses a walker and needs assistance.  He was apparently discharged from physical therapy at home. -I have encouraged him to be physically active and do some exercise even while he is sitting up in the chair.  I have encouraged him to be sitting in the chair most part of the day. -We reviewed his blood work.  White count is 13.8 with 79% neutrophils.  Hemoglobin is 9.8 and platelet count is normal.  LFTs are normal. -He may proceed with cycle 5 without any dose changes.  I will see him back in 3 weeks for follow-up.  We will plan to repeat another PET scan after cycle 6.  2.  Chronic kidney disease: -His creatinine is better today at 1.3.  3.  Pulmonary embolism: -Diagnosed with bilateral pulmonary embolism on 08/26/2019. -She will continue Eliquis.  Does not have any bleeding issues.  4.  Urinary problems: -He failed another trial of discontinuing Foley catheter.  He currently has catheter.   Orders placed this encounter:  No orders of the defined types were placed in this encounter.     Derek Jack, MD [place] [phone]

## 2019-12-31 NOTE — Progress Notes (Signed)
Patient presents today for follow up visit with Dr. Delton Coombes and treatment. Vital signs within parameters for treatment. Labs within parameters for treatment and reviewed by Dr. Delton Coombes. Patient denies any pain today. Daughter at the bedside with patient for appointment. Patient denies any changes since the last visit.   Message received from [facility] LPN/ Dr. Delton Coombes. Proceed with treatment.   Treatment given today per MD orders. Tolerated infusion without adverse affects. Vital signs stable. No complaints at this time. Discharged from clinic via wheel chair. F/U with [facility] as scheduled.

## 2019-12-31 NOTE — Progress Notes (Signed)
Patient has been assessed, vital signs and labs have been reviewed by Dr. Katragadda. ANC, Creatinine, LFTs, and Platelets are within treatment parameters per Dr. Katragadda. The patient is good to proceed with treatment at this time.  

## 2019-12-31 NOTE — Patient Instructions (Addendum)
[facility] at [facility] Discharge Instructions  You were seen today by Dr. Katragadda. He went over your recent lab results. He will see you back in 3 weeks for labs, treatment and follow up.   Thank you for choosing [facility] at [facility] to provide your oncology and hematology care.  To afford each patient quality time with our provider, please arrive at least 15 minutes before your scheduled appointment time.   If you have a lab appointment with the Cancer Center please come in thru the  Main Entrance and check in at the main information desk  You need to re-schedule your appointment should you arrive 10 or more minutes late.  We strive to give you quality time with our providers, and arriving late affects you and other patients whose appointments are after yours.  Also, if you no show three or more times for appointments you may be dismissed from the clinic at the providers discretion.     Again, thank you for choosing [facility].  Our hope is that these requests will decrease the amount of time that you wait before being seen by our physicians.       _____________________________________________________________  Should you have questions after your visit to [facility], please contact our office at [phone] between the hours of 8:00 a.m. and 4:30 p.m.  Voicemails left after 4:00 p.m. will not be returned until the following business day.  For prescription refill requests, have your pharmacy contact our office and allow 72 hours.    Cancer Center Support Programs:   > Cancer Support Group  2nd Tuesday of the month 1pm-2pm, Journey Room    

## 2020-01-02 ENCOUNTER — Other Ambulatory Visit: Payer: Self-pay

## 2020-01-02 ENCOUNTER — Encounter (HOSPITAL_COMMUNITY): Payer: Self-pay

## 2020-01-02 ENCOUNTER — Inpatient Hospital Stay (HOSPITAL_COMMUNITY): Payer: Medicare Other

## 2020-01-02 ENCOUNTER — Ambulatory Visit (INDEPENDENT_AMBULATORY_CARE_PROVIDER_SITE_OTHER): Payer: Medicare Other

## 2020-01-02 VITALS — BP 101/54 | HR 61 | Temp 97.3°F | Resp 18

## 2020-01-02 VITALS — Temp 97.7°F

## 2020-01-02 DIAGNOSIS — N138 Other obstructive and reflux uropathy: Secondary | ICD-10-CM

## 2020-01-02 DIAGNOSIS — N401 Enlarged prostate with lower urinary tract symptoms: Secondary | ICD-10-CM | POA: Diagnosis not present

## 2020-01-02 DIAGNOSIS — Z95828 Presence of other vascular implants and grafts: Secondary | ICD-10-CM

## 2020-01-02 DIAGNOSIS — Z5111 Encounter for antineoplastic chemotherapy: Secondary | ICD-10-CM | POA: Diagnosis not present

## 2020-01-02 DIAGNOSIS — C8513 Unspecified B-cell lymphoma, intra-abdominal lymph nodes: Secondary | ICD-10-CM

## 2020-01-02 MED ORDER — PEGFILGRASTIM-JMDB 6 MG/0.6ML ~~LOC~~ SOSY
6.0000 mg | PREFILLED_SYRINGE | Freq: Once | SUBCUTANEOUS | Status: AC
  Administered 2020-01-02: 6 mg via SUBCUTANEOUS
  Filled 2020-01-02: qty 0.6

## 2020-01-02 NOTE — Progress Notes (Signed)
Daniel Richards tolerated Fulphila injection well without complaints or incident. VSS Pt discharged via wheelchair in satisfactory condition accompanied by her son

## 2020-01-02 NOTE — Patient Instructions (Signed)
[facility] at [facility] Discharge Instructions  Received Fulphila injection today. Follow-up as scheduled. Call clinic for any questions or concerns   Thank you for choosing [facility] at [facility] to provide your oncology and hematology care.  To afford each patient quality time with our provider, please arrive at least 15 minutes before your scheduled appointment time.   If you have a lab appointment with the Cancer Center please come in thru the Main Entrance and check in at the main information desk.  You need to re-schedule your appointment should you arrive 10 or more minutes late.  We strive to give you quality time with our providers, and arriving late affects you and other patients whose appointments are after yours.  Also, if you no show three or more times for appointments you may be dismissed from the clinic at the providers discretion.     Again, thank you for choosing [facility].  Our hope is that these requests will decrease the amount of time that you wait before being seen by our physicians.       _____________________________________________________________  Should you have questions after your visit to [facility], please contact our office at [phone] between the hours of 8:00 a.m. and 4:30 p.m.  Voicemails left after 4:00 p.m. will not be returned until the following business day.  For prescription refill requests, have your pharmacy contact our office and allow 72 hours.    Due to Covid, you will need to wear a mask upon entering the hospital. If you do not have a mask, a mask will be given to you at the Main Entrance upon arrival. For doctor visits, patients may have 1 support person with them. For treatment visits, patients can not have anyone with them due to social distancing guidelines and our immunocompromised population.     

## 2020-01-02 NOTE — Progress Notes (Signed)
Cath Change/ Replacement  Patient is present today for a catheter change due to urinary retention.  8 ml of water was removed from the balloon, a 16 FR foley cath was removed with out difficulty.  Patient was cleaned and prepped in a sterile fashion with betadine. A 16 FR foley cath was replaced into the bladder no complications were noted Urine return was noted 5 ml and urine was yellow in color. The balloon was filled with 44ml of sterile water. A bedside bag was attached for drainage.  Patient was given proper instruction on catheter care.    Performed by: H. Missi Mcmackin LPN  Follow up: 1 month NV Fill  Pull Flow

## 2020-01-05 ENCOUNTER — Other Ambulatory Visit (HOSPITAL_COMMUNITY): Payer: Self-pay | Admitting: *Deleted

## 2020-01-05 DIAGNOSIS — C8336 Diffuse large B-cell lymphoma, intrapelvic lymph nodes: Secondary | ICD-10-CM

## 2020-01-05 NOTE — Progress Notes (Signed)
Home health orders per Dr. Delton Coombes for evaluation for weakness and help with ambulation.

## 2020-01-06 ENCOUNTER — Encounter (HOSPITAL_COMMUNITY): Payer: Self-pay | Admitting: *Deleted

## 2020-01-06 NOTE — Progress Notes (Signed)
I spoke with [facility] and they will be able to accept patient for physical therapy.

## 2020-01-08 ENCOUNTER — Other Ambulatory Visit (HOSPITAL_COMMUNITY): Payer: Self-pay | Admitting: Hematology

## 2020-01-08 DIAGNOSIS — C8513 Unspecified B-cell lymphoma, intra-abdominal lymph nodes: Secondary | ICD-10-CM

## 2020-01-15 ENCOUNTER — Emergency Department (HOSPITAL_COMMUNITY): Payer: Medicare Other

## 2020-01-15 ENCOUNTER — Other Ambulatory Visit: Payer: Self-pay

## 2020-01-15 ENCOUNTER — Encounter (HOSPITAL_COMMUNITY): Payer: Self-pay | Admitting: *Deleted

## 2020-01-15 ENCOUNTER — Inpatient Hospital Stay (HOSPITAL_COMMUNITY)
Admission: EM | Admit: 2020-01-15 | Discharge: 2020-01-18 | DRG: 871 | Disposition: A | Discharge status: Home Health | Payer: Medicare Other | Attending: Internal Medicine | Admitting: Internal Medicine

## 2020-01-15 DIAGNOSIS — I700 Atherosclerosis of aorta: Secondary | ICD-10-CM | POA: Diagnosis present

## 2020-01-15 DIAGNOSIS — Z9181 History of falling: Secondary | ICD-10-CM | POA: Diagnosis not present

## 2020-01-15 DIAGNOSIS — N400 Enlarged prostate without lower urinary tract symptoms: Secondary | ICD-10-CM | POA: Diagnosis present

## 2020-01-15 DIAGNOSIS — Z20822 Contact with and (suspected) exposure to covid-19: Secondary | ICD-10-CM | POA: Diagnosis present

## 2020-01-15 DIAGNOSIS — Z9221 Personal history of antineoplastic chemotherapy: Secondary | ICD-10-CM | POA: Diagnosis not present

## 2020-01-15 DIAGNOSIS — N390 Urinary tract infection, site not specified: Secondary | ICD-10-CM | POA: Diagnosis present

## 2020-01-15 DIAGNOSIS — Z95828 Presence of other vascular implants and grafts: Secondary | ICD-10-CM

## 2020-01-15 DIAGNOSIS — E1142 Type 2 diabetes mellitus with diabetic polyneuropathy: Secondary | ICD-10-CM | POA: Diagnosis present

## 2020-01-15 DIAGNOSIS — F419 Anxiety disorder, unspecified: Secondary | ICD-10-CM | POA: Diagnosis present

## 2020-01-15 DIAGNOSIS — I129 Hypertensive chronic kidney disease with stage 1 through stage 4 chronic kidney disease, or unspecified chronic kidney disease: Secondary | ICD-10-CM | POA: Diagnosis present

## 2020-01-15 DIAGNOSIS — G4700 Insomnia, unspecified: Secondary | ICD-10-CM | POA: Diagnosis present

## 2020-01-15 DIAGNOSIS — R509 Fever, unspecified: Secondary | ICD-10-CM | POA: Diagnosis not present

## 2020-01-15 DIAGNOSIS — A419 Sepsis, unspecified organism: Secondary | ICD-10-CM | POA: Diagnosis present

## 2020-01-15 DIAGNOSIS — E1122 Type 2 diabetes mellitus with diabetic chronic kidney disease: Secondary | ICD-10-CM | POA: Diagnosis present

## 2020-01-15 DIAGNOSIS — F339 Major depressive disorder, recurrent, unspecified: Secondary | ICD-10-CM | POA: Diagnosis present

## 2020-01-15 DIAGNOSIS — Z79891 Long term (current) use of opiate analgesic: Secondary | ICD-10-CM

## 2020-01-15 DIAGNOSIS — C8333 Diffuse large B-cell lymphoma, intra-abdominal lymph nodes: Secondary | ICD-10-CM | POA: Diagnosis present

## 2020-01-15 DIAGNOSIS — I491 Atrial premature depolarization: Secondary | ICD-10-CM | POA: Diagnosis present

## 2020-01-15 DIAGNOSIS — Z79899 Other long term (current) drug therapy: Secondary | ICD-10-CM

## 2020-01-15 DIAGNOSIS — J189 Pneumonia, unspecified organism: Secondary | ICD-10-CM | POA: Diagnosis present

## 2020-01-15 DIAGNOSIS — Z8673 Personal history of transient ischemic attack (TIA), and cerebral infarction without residual deficits: Secondary | ICD-10-CM | POA: Diagnosis not present

## 2020-01-15 DIAGNOSIS — K219 Gastro-esophageal reflux disease without esophagitis: Secondary | ICD-10-CM | POA: Diagnosis present

## 2020-01-15 DIAGNOSIS — N183 Chronic kidney disease, stage 3 unspecified: Secondary | ICD-10-CM | POA: Diagnosis present

## 2020-01-15 DIAGNOSIS — M19011 Primary osteoarthritis, right shoulder: Secondary | ICD-10-CM | POA: Diagnosis present

## 2020-01-15 DIAGNOSIS — Z881 Allergy status to other antibiotic agents status: Secondary | ICD-10-CM

## 2020-01-15 DIAGNOSIS — G250 Essential tremor: Secondary | ICD-10-CM | POA: Diagnosis present

## 2020-01-15 DIAGNOSIS — Z7901 Long term (current) use of anticoagulants: Secondary | ICD-10-CM

## 2020-01-15 DIAGNOSIS — Z86711 Personal history of pulmonary embolism: Secondary | ICD-10-CM | POA: Diagnosis not present

## 2020-01-15 DIAGNOSIS — K529 Noninfective gastroenteritis and colitis, unspecified: Secondary | ICD-10-CM | POA: Diagnosis present

## 2020-01-15 DIAGNOSIS — Z8711 Personal history of peptic ulcer disease: Secondary | ICD-10-CM

## 2020-01-15 DIAGNOSIS — Z7984 Long term (current) use of oral hypoglycemic drugs: Secondary | ICD-10-CM

## 2020-01-15 DIAGNOSIS — N12 Tubulo-interstitial nephritis, not specified as acute or chronic: Secondary | ICD-10-CM

## 2020-01-15 LAB — CBC WITH DIFFERENTIAL/PLATELET
Abs Immature Granulocytes: 0.35 10*3/uL — ABNORMAL HIGH (ref 0.00–0.07)
Basophils Absolute: 0.1 10*3/uL (ref 0.0–0.1)
Basophils Relative: 0 %
Eosinophils Absolute: 0 10*3/uL (ref 0.0–0.5)
Eosinophils Relative: 0 %
HCT: 30.6 % — ABNORMAL LOW (ref 39.0–52.0)
Hemoglobin: 9.5 g/dL — ABNORMAL LOW (ref 13.0–17.0)
Immature Granulocytes: 2 %
Lymphocytes Relative: 5 %
Lymphs Abs: 1.1 10*3/uL (ref 0.7–4.0)
MCH: 32.1 pg (ref 26.0–34.0)
MCHC: 31 g/dL (ref 30.0–36.0)
MCV: 103.4 fL — ABNORMAL HIGH (ref 80.0–100.0)
Monocytes Absolute: 1.3 10*3/uL — ABNORMAL HIGH (ref 0.1–1.0)
Monocytes Relative: 6 %
Neutro Abs: 19.2 10*3/uL — ABNORMAL HIGH (ref 1.7–7.7)
Neutrophils Relative %: 87 %
Platelets: 265 10*3/uL (ref 150–400)
RBC: 2.96 MIL/uL — ABNORMAL LOW (ref 4.22–5.81)
RDW: 14.7 % (ref 11.5–15.5)
WBC: 22 10*3/uL — ABNORMAL HIGH (ref 4.0–10.5)
nRBC: 0 % (ref 0.0–0.2)

## 2020-01-15 LAB — COMPREHENSIVE METABOLIC PANEL
ALT: 17 U/L (ref 0–44)
AST: 15 U/L (ref 15–41)
Albumin: 3.1 g/dL — ABNORMAL LOW (ref 3.5–5.0)
Alkaline Phosphatase: 103 U/L (ref 38–126)
Anion gap: 8 (ref 5–15)
BUN: 22 mg/dL (ref 8–23)
CO2: 25 mmol/L (ref 22–32)
Calcium: 8.6 mg/dL — ABNORMAL LOW (ref 8.9–10.3)
Chloride: 104 mmol/L (ref 98–111)
Creatinine, Ser: 1.64 mg/dL — ABNORMAL HIGH (ref 0.61–1.24)
GFR calc Af Amer: 47 mL/min — ABNORMAL LOW (ref >60)
GFR calc non Af Amer: 40 mL/min — ABNORMAL LOW (ref >60)
Glucose, Bld: 139 mg/dL — ABNORMAL HIGH (ref 70–99)
Potassium: 3.9 mmol/L (ref 3.5–5.1)
Sodium: 137 mmol/L (ref 135–145)
Total Bilirubin: 0.6 mg/dL (ref 0.3–1.2)
Total Protein: 6.6 g/dL (ref 6.5–8.1)

## 2020-01-15 LAB — APTT: aPTT: 41 seconds — ABNORMAL HIGH (ref 24–36)

## 2020-01-15 LAB — URINALYSIS, ROUTINE W REFLEX MICROSCOPIC
Bilirubin Urine: NEGATIVE
Glucose, UA: NEGATIVE mg/dL
Ketones, ur: NEGATIVE mg/dL
Nitrite: POSITIVE — AB
Protein, ur: 100 mg/dL — AB
Specific Gravity, Urine: 1.011 (ref 1.005–1.030)
WBC, UA: >50 WBC/hpf — ABNORMAL HIGH (ref 0–5)
pH: 5 (ref 5.0–8.0)

## 2020-01-15 LAB — INFLUENZA PANEL BY PCR (TYPE A & B)
Influenza A By PCR: NEGATIVE
Influenza B By PCR: NEGATIVE

## 2020-01-15 LAB — POC SARS CORONAVIRUS 2 AG -  ED: SARS Coronavirus 2 Ag: NEGATIVE

## 2020-01-15 LAB — PROTIME-INR
INR: 1.7 — ABNORMAL HIGH (ref 0.8–1.2)
Prothrombin Time: 19.5 seconds — ABNORMAL HIGH (ref 11.4–15.2)

## 2020-01-15 LAB — LACTIC ACID, PLASMA
Lactic Acid, Venous: 1.3 mmol/L (ref 0.5–1.9)
Lactic Acid, Venous: 1 mmol/L (ref 0.5–1.9)

## 2020-01-15 LAB — GLUCOSE, CAPILLARY: Glucose-Capillary: 104 mg/dL — ABNORMAL HIGH (ref 70–99)

## 2020-01-15 MED ORDER — SODIUM CHLORIDE 0.9 % IV SOLN
2.0000 g | Freq: Once | INTRAVENOUS | Status: AC
  Administered 2020-01-15: 2 g via INTRAVENOUS
  Filled 2020-01-15: qty 2

## 2020-01-15 MED ORDER — HYDROCODONE-ACETAMINOPHEN 5-325 MG PO TABS
1.0000 | ORAL_TABLET | Freq: Once | ORAL | Status: AC
  Administered 2020-01-15: 1 via ORAL
  Filled 2020-01-15: qty 1

## 2020-01-15 MED ORDER — APIXABAN 5 MG PO TABS
5.0000 mg | ORAL_TABLET | Freq: Two times a day (BID) | ORAL | Status: DC
  Administered 2020-01-15 – 2020-01-18 (×6): 5 mg via ORAL
  Filled 2020-01-15 (×6): qty 1

## 2020-01-15 MED ORDER — ONDANSETRON HCL 4 MG PO TABS: 4.0000 mg | ORAL_TABLET | Freq: Four times a day (QID) | ORAL | Status: DC | PRN

## 2020-01-15 MED ORDER — SODIUM CHLORIDE 0.9 % IV SOLN: INTRAVENOUS | Status: DC

## 2020-01-15 MED ORDER — SODIUM CHLORIDE 0.9 % IV SOLN
2.0000 g | Freq: Two times a day (BID) | INTRAVENOUS | Status: DC
  Administered 2020-01-16 – 2020-01-18 (×4): 2 g via INTRAVENOUS
  Filled 2020-01-15 (×4): qty 2

## 2020-01-15 MED ORDER — ACETAMINOPHEN 650 MG RE SUPP: 650.0000 mg | Freq: Four times a day (QID) | RECTAL | Status: DC | PRN

## 2020-01-15 MED ORDER — GABAPENTIN 300 MG PO CAPS
600.0000 mg | ORAL_CAPSULE | Freq: Three times a day (TID) | ORAL | Status: DC
  Administered 2020-01-16 – 2020-01-18 (×7): 600 mg via ORAL
  Filled 2020-01-15 (×7): qty 2

## 2020-01-15 MED ORDER — INSULIN ASPART 100 UNIT/ML ~~LOC~~ SOLN
0.0000 [IU] | Freq: Three times a day (TID) | SUBCUTANEOUS | Status: DC
  Administered 2020-01-16: 1 [IU] via SUBCUTANEOUS
  Administered 2020-01-17: 2 [IU] via SUBCUTANEOUS
  Administered 2020-01-18: 1 [IU] via SUBCUTANEOUS

## 2020-01-15 MED ORDER — POLYETHYLENE GLYCOL 3350 17 G PO PACK: 17.0000 g | PACK | Freq: Every day | ORAL | Status: DC | PRN

## 2020-01-15 MED ORDER — PANTOPRAZOLE SODIUM 40 MG PO TBEC
40.0000 mg | DELAYED_RELEASE_TABLET | Freq: Two times a day (BID) | ORAL | Status: DC
  Administered 2020-01-16 – 2020-01-18 (×5): 40 mg via ORAL
  Filled 2020-01-15 (×5): qty 1

## 2020-01-15 MED ORDER — SODIUM CHLORIDE 0.9 % IV BOLUS
500.0000 mL | Freq: Once | INTRAVENOUS | Status: AC
  Administered 2020-01-15: 500 mL via INTRAVENOUS

## 2020-01-15 MED ORDER — METRONIDAZOLE IN NACL 5-0.79 MG/ML-% IV SOLN
500.0000 mg | Freq: Once | INTRAVENOUS | Status: AC
  Administered 2020-01-15: 500 mg via INTRAVENOUS
  Filled 2020-01-15: qty 100

## 2020-01-15 MED ORDER — TEMAZEPAM 15 MG PO CAPS
15.0000 mg | ORAL_CAPSULE | Freq: Every evening | ORAL | Status: DC | PRN
  Administered 2020-01-16 – 2020-01-17 (×3): 15 mg via ORAL
  Filled 2020-01-15 (×3): qty 1

## 2020-01-15 MED ORDER — CITALOPRAM HYDROBROMIDE 20 MG PO TABS
20.0000 mg | ORAL_TABLET | Freq: Every day | ORAL | Status: DC
  Administered 2020-01-15 – 2020-01-17 (×3): 20 mg via ORAL
  Filled 2020-01-15 (×3): qty 1

## 2020-01-15 MED ORDER — PRIMIDONE 50 MG PO TABS
75.0000 mg | ORAL_TABLET | Freq: Three times a day (TID) | ORAL | Status: DC
  Administered 2020-01-16 – 2020-01-18 (×8): 75 mg via ORAL
  Filled 2020-01-15 (×9): qty 2

## 2020-01-15 MED ORDER — FOLIC ACID 1 MG PO TABS
1.0000 mg | ORAL_TABLET | Freq: Every day | ORAL | Status: DC
  Administered 2020-01-16 – 2020-01-18 (×3): 1 mg via ORAL
  Filled 2020-01-15 (×3): qty 1

## 2020-01-15 MED ORDER — ENSURE ENLIVE PO LIQD
237.0000 mL | Freq: Two times a day (BID) | ORAL | Status: DC
  Administered 2020-01-16 (×2): 237 mL via ORAL

## 2020-01-15 MED ORDER — INSULIN ASPART 100 UNIT/ML ~~LOC~~ SOLN: 0.0000 [IU] | Freq: Every day | SUBCUTANEOUS | Status: DC

## 2020-01-15 MED ORDER — ACETAMINOPHEN 325 MG PO TABS
650.0000 mg | ORAL_TABLET | Freq: Four times a day (QID) | ORAL | Status: DC | PRN
  Administered 2020-01-16 (×2): 650 mg via ORAL
  Filled 2020-01-15 (×2): qty 2

## 2020-01-15 MED ORDER — ONDANSETRON HCL 4 MG/2ML IJ SOLN: 4.0000 mg | Freq: Four times a day (QID) | INTRAMUSCULAR | Status: DC | PRN

## 2020-01-15 MED ORDER — TAMSULOSIN HCL 0.4 MG PO CAPS
0.4000 mg | ORAL_CAPSULE | Freq: Every day | ORAL | Status: DC
  Administered 2020-01-16 – 2020-01-17 (×2): 0.4 mg via ORAL
  Filled 2020-01-15 (×2): qty 1

## 2020-01-15 NOTE — ED Provider Notes (Addendum)
[facility] Provider Note   CSN: [id number] Arrival date & time: 01/15/20  1604     History Chief Complaint  Patient presents with  . Fever     HPI Daniel Richards is a 74 y.o. male with a history of B-cell lymphoma currently being treated w/chemotherapy, presents to the ED with body aches, chills, headache, coughing, fever of 103.3 with thermometer at home which started yesterday and got severely worse last night.  Coughing w/ mucus, no hematemesis, no n/v/d, no abdominal pain, reports taking oxycodone at home which helped him sleep but it wore off really quickly. No urinary symptoms.  He had severe weakness which caused him to fall on the way to the bathroom with his walker last night.  Reports falling backwards and hitting the back of the head, no LOC, headache and difficulty with memory since fall. No focal neuro deficits noticed by patient.  Patient lives at home with his family, no known Covid exposures.   Past Medical History:  Diagnosis Date  . Acid reflux   . Anxiety and depression   . Cancer ([place])    lymphoma  . Depression   . Diabetes ([place])   . Diabetes mellitus 06/09/2011   pt taking metformin  . DJD of shoulder    right   . Hyperlipidemia   . Hypertension   . Lymphoma ([place])   . Migraines   . Port-A-Cath in place 08/13/2019  . Pulmonary embolism ([place])   . Stomach ulcer   . Tremor   . Tremor   . Vitamin D deficiency     Patient Active Problem List   Diagnosis Date Noted  . Sepsis ([place]) 01/15/2020  . Hypokalemia   . Acute cystitis without hematuria   . Epiploic appendagitis 09/27/2019  . Acute saddle pulmonary embolism with acute cor pulmonale (HCC)   . Urinary tract infection associated with indwelling urethral catheter ([place])   . Febrile neutropenia ([place]) 08/26/2019  . PE (pulmonary thromboembolism) ([place]) 08/26/2019  . Neutropenia, drug-induced ([place]) 08/20/2019  . 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 Port-A-Cath in place  08/13/2019  . Anemia of chronic disease 08/07/2019  . Major depression, recurrent, chronic ([place]) 08/07/2019  . BPH without urinary obstruction 08/07/2019  . Aortic atherosclerosis ([place]) 08/07/2019  . Diabetic peripheral neuropathy ([place]) 08/07/2019  . Hypertension associated with type 2 diabetes mellitus ([place]) 08/07/2019  . 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 Odynophagia   . Acute kidney injury ([place])   . Falls frequently   . Generalized abdominal pain   . Goals of care, counseling/discussion   . Palliative care by specialist   . Folate deficiency   . Generalized weakness 07/25/2019  . Intra-abdominal lymphadenopathy 07/25/2019  . Retroperitoneal lymphadenopathy 07/25/2019  . Malnutrition of moderate degree 07/25/2019  . Tremor   . Hypercalcemia 07/24/2019  . B12 deficiency 11/18/2016  . Weakness generalized 11/16/2016  . Altered mental status, unspecified 11/16/2016  . Fracture, rib 11/16/2016  . Weakness 11/16/2016  . New onset of headaches after age 30 09/01/2015  . Morning headache 09/01/2015  . Daytime somnolence 09/01/2015  . Essential tremor 09/01/2015  . Intractable headache 09/01/2015  . Type 2 diabetes, controlled, with peripheral neuropathy ([place]) 06/09/2011    Past Surgical History:  Procedure Laterality Date  . BACK SURGERY    . KNEE SURGERY    . PORTACATH PLACEMENT Left 08/08/2019   Procedure: INSERTION PORT-A-CATH;  Surgeon: Arnoldo Morale,  Elta Guadeloupe, MD;  Location: [facility];  Service: General;  Laterality: Left;  . SHOULDER SURGERY         Family History  Problem Relation Age of Onset  . Cancer Sister   . Cancer Sister   . Cancer Brother   . Heart attack Father   . Cancer Brother   . Heart attack Brother   . Healthy Son   . Healthy Daughter   . Migraines Neg Hx     Social History   Tobacco Use  . Smoking status: Former Smoker    Packs/day: 2.00    Years: 20.00    Pack years: 40.00     Types: Cigarettes    Quit date: 11/17/1989    Years since quitting: 30.1  . Smokeless tobacco: Never Used  Substance Use Topics  . Alcohol use: No  . Drug use: No    Home Medications Prior to Admission medications   Medication Sig Start Date End Date Taking? Authorizing Provider  acetaminophen (TYLENOL) 325 MG tablet Take 2 tablets (650 mg total) by mouth every 6 (six) hours as needed for mild pain, fever or headache. Patient not taking: Reported on 12/10/2019 08/17/19   Roxan Hockey, MD  allopurinol (ZYLOPRIM) 300 MG tablet Take 1 tablet (300 mg total) by mouth daily. 09/15/19   Derek Jack, MD  calcitRIOL (ROCALTROL) 0.5 MCG capsule Take 1 capsule (0.5 mcg total) by mouth daily. 08/06/19   Gerlene Fee, NP  citalopram (CELEXA) 20 MG tablet Take 1 tablet (20 mg total) by mouth at bedtime. Patient taking differently: Take 40 mg by mouth at bedtime.  08/06/19   Gerlene Fee, NP  CYCLOPHOSPHAMIDE IV Inject into the vein every 21 ( twenty-one) days. 08/20/19   [provider]  DOXORUBICIN HCL IV Inject into the vein every 21 ( twenty-one) days. 08/20/19   [provider]  ELIQUIS 5 MG TABS tablet Take 5 mg by mouth 2 (two) times daily. 12/23/19   [provider]  folic acid (FOLVITE) 1 MG tablet Take 1 tablet (1 mg total) by mouth daily. 08/06/19   Gerlene Fee, NP  gabapentin (NEURONTIN) 300 MG capsule Take 2 capsules (600 mg total) by mouth 3 (three) times daily. 08/06/19   Gerlene Fee, NP  GAVILAX 17 GM/SCOOP powder Take 17 g by mouth daily as needed for mild constipation or moderate constipation.  08/18/19   [provider]  HYDROcodone-acetaminophen (NORCO) 10-325 MG tablet Take 1 tablet by mouth every 6 (six) hours as needed for moderate pain or severe pain.  12/08/19   [provider]  lidocaine-prilocaine (EMLA) cream Apply a small amount to port a cath site and cover with plastic wrap 1 hour prior to chemotherapy  appointments Patient not taking: Reported on 12/10/2019 08/13/19   Derek Jack, MD  lisinopril (ZESTRIL) 20 MG tablet Take 20 mg by mouth daily. 10/07/19   [provider]  metFORMIN (GLUCOPHAGE) 500 MG tablet Take 1 tablet (500 mg total) by mouth daily. 08/06/19   Gerlene Fee, NP  ondansetron (ZOFRAN) 4 MG tablet Take 1 tablet (4 mg total) by mouth every 6 (six) hours as needed for nausea. Patient not taking: Reported on 12/10/2019 08/17/19   Roxan Hockey, MD  pantoprazole (PROTONIX) 40 MG tablet Take 1 tablet (40 mg total) by mouth 2 (two) times daily before a meal. 08/06/19   Nyoka Cowden, Phylis Bougie, NP  predniSONE (DELTASONE) 20 MG tablet Take 3 tablets daily on days 1-5 of  chemotherapy. Patient taking differently: Take 60 mg by mouth See admin instructions. Take 60mg  (3 tablets total) on days 1 through 5 of chemotherapy 01/08/20   Lockamy, Randi L, NP-C  primidone (MYSOLINE) 50 MG tablet Take 1.5 tablets (75 mg total) by mouth every 8 (eight) hours. Patient taking differently: Take 100 mg by mouth 2 (two) times daily.  08/06/19   Gerlene Fee, NP  Probiotic CAPS Take 1 capsule by mouth 2 (two) times daily. 10/21/19   [provider]  prochlorperazine (COMPAZINE) 10 MG tablet Take 1 tablet (10 mg total) by mouth every 6 (six) hours as needed (Nausea or vomiting). Patient not taking: Reported on 12/10/2019 08/13/19   Derek Jack, MD  propranolol (INDERAL) 40 MG tablet Take 1 tablet (40 mg total) by mouth 2 (two) times daily. 08/06/19   Gerlene Fee, NP  riTUXimab in sodium chloride 0.9 % 250 mL Inject into the vein every 21 ( twenty-one) days. 08/20/19   [provider]  tamsulosin (FLOMAX) 0.4 MG CAPS capsule Take 1 capsule (0.4 mg total) by mouth daily after supper. 11/18/19   Franchot Gallo, MD  temazepam (RESTORIL) 15 MG capsule Take 1 capsule (15 mg total) by mouth at bedtime. 10/07/19   Barton Dubois, MD  topiramate (TOPAMAX) 100 MG tablet Take 2  tablets (200 mg total) by mouth at bedtime. 08/06/19   Gerlene Fee, NP  vinCRIStine 2 mg in sodium chloride 0.9 % 50 mL Inject 2 mg into the vein every 21 ( twenty-one) days. 08/20/19   [provider]    Allergies    Vancomycin  Review of Systems   Review of Systems  Constitutional: Positive for activity change, appetite change, chills, diaphoresis, fatigue and fever.  HENT: Negative for hearing loss, sinus pain, sneezing, sore throat and tinnitus.   Respiratory: Positive for cough, shortness of breath and wheezing.   Cardiovascular: Negative for chest pain.  Gastrointestinal: Negative for abdominal pain, constipation, diarrhea, nausea and vomiting.  Genitourinary: Negative for difficulty urinating, dysuria, flank pain, frequency and hematuria.  Musculoskeletal: Positive for myalgias.  Neurological: Positive for tremors.       Lower jaw tremor   Psychiatric/Behavioral: Negative for confusion.  All other systems reviewed and are negative.   Physical Exam Updated Vital Signs BP (!) 102/50   Pulse 91   Temp (!) 101.4 F (38.6 C) (Rectal)   Resp 19   Ht 5\' 9"  (1.753 m)   Wt 74 kg   SpO2 98%   BMI 24.09 kg/m   Physical Exam Constitutional:      Appearance: He is ill-appearing and diaphoretic.  HENT:     Right Ear: Tympanic membrane normal.     Left Ear: Tympanic membrane normal.     Mouth/Throat:     Mouth: Mucous membranes are dry.  Eyes:     Extraocular Movements: Extraocular movements intact.     Pupils: Pupils are equal, round, and reactive to light.  Cardiovascular:     Rate and Rhythm: Regular rhythm. Tachycardia present.  Pulmonary:     Breath sounds: Wheezing present.     Comments: Increased WOB Abdominal:     General: Abdomen is flat.     Palpations: Abdomen is soft.  Musculoskeletal:     Cervical back: Normal range of motion.  Neurological:     General: No focal deficit present.     Mental Status: He is alert and oriented to person, place,  and time.     Comments:  Lower jaw tremor, at baseline per pt.  Psychiatric:        Mood and Affect: Mood normal.        Behavior: Behavior normal.     ED Results / Procedures / Treatments   Labs (all labs ordered are listed, but only abnormal results are displayed) Labs Reviewed  COMPREHENSIVE METABOLIC PANEL - Abnormal; Notable for the following components:      Result Value   Glucose, Bld 139 (*)    Creatinine, Ser 1.64 (*)    Calcium 8.6 (*)    Albumin 3.1 (*)    GFR calc non Af Amer 40 (*)    GFR calc Af Amer 47 (*)    All other components within normal limits  CBC WITH DIFFERENTIAL/PLATELET - Abnormal; Notable for the following components:   WBC 22.0 (*)    RBC 2.96 (*)    Hemoglobin 9.5 (*)    HCT 30.6 (*)    MCV 103.4 (*)    Neutro Abs 19.2 (*)    Monocytes Absolute 1.3 (*)    Abs Immature Granulocytes 0.35 (*)    All other components within normal limits  APTT - Abnormal; Notable for the following components:   aPTT 41 (*)    All other components within normal limits  PROTIME-INR - Abnormal; Notable for the following components:   Prothrombin Time 19.5 (*)    INR 1.7 (*)    All other components within normal limits  URINALYSIS, ROUTINE W REFLEX MICROSCOPIC - Abnormal; Notable for the following components:   APPearance CLOUDY (*)    Hgb urine dipstick SMALL (*)    Protein, ur 100 (*)    Nitrite POSITIVE (*)    Leukocytes,Ua LARGE (*)    WBC, UA >50 (*)    Bacteria, UA MANY (*)    All other components within normal limits  CULTURE, BLOOD (ROUTINE X 2)  CULTURE, BLOOD (ROUTINE X 2)  URINE CULTURE  SARS CORONAVIRUS 2 (TAT 6-24 HRS)  LACTIC ACID, PLASMA  LACTIC ACID, PLASMA  HEMOGLOBIN A1C  POC SARS CORONAVIRUS 2 AG -  ED    EKG EKG Interpretation  Date/Time:  Thursday January 15 2020 16:28:30 EST Ventricular Rate:  92 PR Interval:    QRS Duration: 99 QT Interval:  350 QTC Calculation: 433 R Axis:   72 Text Interpretation: Sinus rhythm Atrial  premature complex Since last tracing rate slower Confirmed by Noemi Chapel [phone]) on 01/15/2020 8:23:30 PM   Radiology DG Chest 1 View  Result Date: 01/15/2020 CLINICAL DATA:  Fever and cough EXAM: CHEST  1 VIEW COMPARISON:  08/26/2019 FINDINGS: Cardiac shadow is within normal limits. Left chest wall port is again seen and stable. Postsurgical changes in the cervical spine are noted. The lungs are well aerated bilaterally without focal infiltrate or sizable effusion. No bony abnormality is noted. IMPRESSION: No active disease. Electronically Signed   By: Inez Catalina M.D.   On: 01/15/2020 18:48   CT Head Wo Contrast  Result Date: 01/15/2020 CLINICAL DATA:  Minor head trauma from fall. EXAM: CT HEAD WITHOUT CONTRAST TECHNIQUE: Contiguous axial images were obtained from the base of the skull through the vertex without intravenous contrast. COMPARISON:  August 15, 2019. FINDINGS: Brain: No evidence of acute infarction, hemorrhage, hydrocephalus, extra-axial collection or mass lesion/mass effect. Subacute or chronic -appearing lacunar infarctions in the left insular ribbon and right Peri basal ganglia deep white matter. Vascular: No hyperdense vessel. Intracranial calcified atherosclerosis. Skull: Normal. Sinuses/Orbits: No traumatic air-fluid  levels in the paranasal sinuses. Mild bilateral maxillary and ethmoidal mucosal thickening. Blunting of the right internal auditory canal. Other: Debris, probably cerumen in the left external auditory canal. Pneumatized mastoid air cells and middle ears. IMPRESSION: No acute intracranial or skull injury. Mild cerebral atrophy and microvascular white matter ischemic changes with bilateral subacute or chronic -appearing lacunar infarction and intracranial calcified atherosclerosis. Blunting of the right internal auditory canal, which questions a possible acute pharyngitis or right otitis media. Mild paranasal sinus mucoperiosteal disease. Electronically Signed   By:  Revonda Humphrey   On: 01/15/2020 19:48    Procedures Procedures (including critical care time)  Medications Ordered in ED Medications  metroNIDAZOLE (FLAGYL) IVPB 500 mg (500 mg Intravenous New Bag/Given 01/15/20 2054)  ceFEPIme (MAXIPIME) 2 g in sodium chloride 0.9 % 100 mL IVPB (has no administration in time range)  ceFEPIme (MAXIPIME) 2 g in sodium chloride 0.9 % 100 mL IVPB (0 g Intravenous Stopped 01/15/20 2055)    ED Course  I have reviewed the triage vital signs and the nursing notes.  Pertinent labs & imaging results that were available during my care of the patient were reviewed by me and considered in my medical decision making (see chart for details).  74 year old male who is currently being treated for B-cell lymphoma presents to the emergency room with cough, fever, body aches, weakness, fall last night with head injury. Patient is alert and oriented, but ill-appearing, mildly diaphoretic, warm to touch.  Saturating at 96% on room air.  Borderline tachycardic, rectal temp 101.4. Discussed care with Noemi Chapel, MD, initiated sepsis work-up, CT scan of head status post fall.Broad spectrum abx for unknown source.  Anticipate admission for febrile illness   Clinical Course as of Jan 15 2140  Thu Jan 15, 2020  1905 Covid negative, WBC is 22, hemoglobin 9.5, elevated PT/INR, initiate code sepsis.  Treat with broad-spectrum antibiotics for unknown source.  CT shows no acute intracranial injury, blunting of right auditory canal which may suggest acute pharyngitis/acute otitis media.  Bilateral subacute/chronic lacunar infarcts noted.   [MB]    Clinical Course User Index [MB] Garald Balding, PA-C   MDM Rules/Calculators/A&P                     Urinalysis showed many leukocytes and positive nitrites, suggestive of urosepsis.  Severe leukocytosis, WBC count 22,000, elevated neutrophil count.  CT negative for bleed, and chest x-ray negative for acute pathology.  Lactate normal,  patient continues to have fever and tachycardia, not hypotensive. Given cefepime for broad spectrum coverage and flagyl for UTI. Dr. Noemi Chapel consulted hospitalist, patient to be admitted for higher level of care. Critical care provided.     Final Clinical Impression(s) / ED Diagnoses Final diagnoses:  Sepsis, due to unspecified organism, unspecified whether acute organ dysfunction present [facility])  Pyelonephritis    Rx / DC Orders ED Discharge Orders    None       Garald Balding, PA-C 01/15/20 2143    Garald Balding, PA-C 01/15/20 2242    Noemi Chapel, MD 01/16/20 [phone]

## 2020-01-15 NOTE — ED Provider Notes (Signed)
Medical screening examination/treatment/procedure(s) were conducted as a shared visit with non-physician practitioner(s) and myself.  I personally evaluated the patient during the encounter.  Clinical Impression:   Final diagnoses:  Sepsis, due to unspecified organism, unspecified whether acute organ dysfunction present [facility])  Pyelonephritis    Pt has hx of CA, on chemo, presents febrile - 103 at home, chills, cough and wheeze - weakness, had fall and couldn't get up but other than tremor (baseline), has no focal sx on my neuro exam - he is borderline tachy and has scattered wheezes and rales, no edema, anticipate admit for febrile illness.  This patient ended up having a urinary tract infection, creatinine was slightly elevated at 1.6, there were many bacteria and too numerous to count white blood cells essentially.  He had a huge leukocytosis of over 22,000 with his fever and tachycardia all suggestive that he has a severe infection.  His lactic acid was normal at 1.3, he was not hypotensive and had a MAP over 60.  I called the hospitalist to admit, they will come to see the patient for admission, he will need a higher level of care, he has received cefepime as an antibiotic which would cover his illness, his chest x-ray was negative for acute infiltrates.  Critical care provided.   EKG Interpretation  Date/Time:  Thursday January 15 2020 16:28:30 EST Ventricular Rate:  92 PR Interval:    QRS Duration: 99 QT Interval:  350 QTC Calculation: 433 R Axis:   72 Text Interpretation: Sinus rhythm Atrial premature complex Since last tracing rate slower Confirmed by Noemi Chapel [id number]) on 01/15/2020 8:23:30 PM       .Critical Care Performed by: Noemi Chapel, MD Authorized by: Noemi Chapel, MD   Critical care provider statement:    Critical care time (minutes):  35   Critical care time was exclusive of:  Separately billable procedures and treating other patients and teaching time  Critical care was necessary to treat or prevent imminent or life-threatening deterioration of the following conditions:  Sepsis   Critical care was time spent personally by me on the following activities:  Blood draw for specimens, development of treatment plan with patient or surrogate, discussions with consultants, evaluation of patient's response to treatment, examination of patient, obtaining history from patient or surrogate, ordering and performing treatments and interventions, ordering and review of laboratory studies, ordering and review of radiographic studies, pulse oximetry, re-evaluation of patient's condition and review of old charts        Noemi Chapel, MD 01/16/20 1138

## 2020-01-15 NOTE — ED Triage Notes (Signed)
Patient presents to the ED with cough, fever since last night.  Patient family reports a tympanic temperature of 103 today and had been given 500mg . Tylenol..  Patient states he had chemotherapy sometime last week.

## 2020-01-15 NOTE — H&P (Signed)
History and Physical    LEQUAN Richards [id number] DOB: [birth year] DOA: 01/15/2020  PCP: [name], FNP   Patient coming from: Home  I have personally briefly reviewed patient's old medical records in [place]  Chief Complaint: Fever, Cough  HPI: Daniel Richards is a 74 y.o. male with medical history significant for B-cell lymphoma, CKD 3, hypertension, depression and diabetes mellitus.  Patient presented to the ED with complaints of productive cough, chills, body aches, and fever started last night.  Temp of 103 was recorded at home.  Patient is also on chemotherapy.  Patient also reports generalized weakness, causing him to fall yesterday and hit his head when he was walking to the bathroom with his walker last night. Cough is productive of yellowish sputum, present over the past week but particularly worse last night.  He denies pain with urination.  No difficulty breathing.  No loose stools no abdominal pain.  Reports compliance with all his medications but does not know the names of what he takes. Reports pain today in his left ear, but his right ear is fine.  ED Course: T-max 101.4, blood pressure 95-135, O2 sats 90 to 98% on 2 L O2.  Leukocytosis 22.  Creatinine 1.6.  Lactic acid normal 1.3.  UA with leukocytes positive nitrites, many bacteria and greater than 50 WBCs.  Chest x-ray negative for acute abnormality. POC Covid test negative.  No acute intracranial or skull injury, Chronic abnormalities.  Blood and urine cultures obtained.  IV cefepime and metronidazole started.  Hospitalist to admit for further evaluation and management.  Review of Systems: As per HPI all other systems reviewed and negative.  Past Medical History:  Diagnosis Date  . Acid reflux   . Anxiety and depression   . Cancer ([place])    lymphoma  . Depression   . Diabetes ([place])   . Diabetes mellitus 06/09/2011   pt taking metformin  . DJD of shoulder    right   . Hyperlipidemia   .  Hypertension   . Lymphoma ([place])   . Migraines   . Port-A-Cath in place 08/13/2019  . Pulmonary embolism ([place])   . Stomach ulcer   . Tremor   . Tremor   . Vitamin D deficiency     Past Surgical History:  Procedure Laterality Date  . BACK SURGERY    . KNEE SURGERY    . PORTACATH PLACEMENT Left 08/08/2019   Procedure: INSERTION PORT-A-CATH;  Surgeon: Aviva Signs, MD;  Location: [facility];  Service: General;  Laterality: Left;  . SHOULDER SURGERY       reports that he quit smoking about 30 years ago. His smoking use included cigarettes. He has a 40.00 pack-year smoking history. He has never used smokeless tobacco. He reports that he does not drink alcohol or use drugs.  Allergies  Allergen Reactions  . Vancomycin Hives    Patient received Benadryl to treat the hives    Family History  Problem Relation Age of Onset  . Cancer Sister   . Cancer Sister   . Cancer Brother   . Heart attack Father   . Cancer Brother   . Heart attack Brother   . Healthy Son   . Healthy Daughter   . Migraines Neg Hx     Prior to Admission medications   Medication Sig Start Date End Date Taking? Authorizing Provider  acetaminophen (TYLENOL) 325 MG tablet Take 2 tablets (650 mg total) by mouth every 6 (  six) hours as needed for mild pain, fever or headache. Patient not taking: Reported on 12/10/2019 08/17/19   Roxan Hockey, MD  allopurinol (ZYLOPRIM) 300 MG tablet Take 1 tablet (300 mg total) by mouth daily. 09/15/19   Derek Jack, MD  calcitRIOL (ROCALTROL) 0.5 MCG capsule Take 1 capsule (0.5 mcg total) by mouth daily. 08/06/19   Gerlene Fee, NP  citalopram (CELEXA) 20 MG tablet Take 1 tablet (20 mg total) by mouth at bedtime. 08/06/19   Gerlene Fee, NP  CYCLOPHOSPHAMIDE IV Inject into the vein every 21 ( twenty-one) days. 08/20/19   [provider]  DOXORUBICIN HCL IV Inject into the vein every 21 ( twenty-one) days. 08/20/19   [provider]  ELIQUIS 5 MG TABS  tablet Take 5 mg by mouth 2 (two) times daily. 12/23/19   [provider]  folic acid (FOLVITE) 1 MG tablet Take 1 tablet (1 mg total) by mouth daily. 08/06/19   Gerlene Fee, NP  gabapentin (NEURONTIN) 300 MG capsule Take 2 capsules (600 mg total) by mouth 3 (three) times daily. 08/06/19   Gerlene Fee, NP  GAVILAX 17 GM/SCOOP powder SMARTSIG:17 Gram(s) By Mouth Daily PRN 08/18/19   [provider]  HYDROcodone-acetaminophen (NORCO) 10-325 MG tablet Take 1 tablet by mouth every 6 (six) hours as needed. 12/08/19   [provider]  lidocaine-prilocaine (EMLA) cream Apply a small amount to port a cath site and cover with plastic wrap 1 hour prior to chemotherapy appointments Patient not taking: Reported on 12/10/2019 08/13/19   Derek Jack, MD  lisinopril (ZESTRIL) 20 MG tablet Take 20 mg by mouth daily. 10/07/19   [provider]  metFORMIN (GLUCOPHAGE) 500 MG tablet Take 1 tablet (500 mg total) by mouth daily. 08/06/19   Gerlene Fee, NP  ondansetron (ZOFRAN) 4 MG tablet Take 1 tablet (4 mg total) by mouth every 6 (six) hours as needed for nausea. Patient not taking: Reported on 12/10/2019 08/17/19   Roxan Hockey, MD  pantoprazole (PROTONIX) 40 MG tablet Take 1 tablet (40 mg total) by mouth 2 (two) times daily before a meal. 08/06/19   Nyoka Cowden, Phylis Bougie, NP  predniSONE (DELTASONE) 20 MG tablet Take 3 tablets daily on days 1-5 of chemotherapy. 01/08/20   Lockamy, Randi L, NP-C  primidone (MYSOLINE) 50 MG tablet Take 1.5 tablets (75 mg total) by mouth every 8 (eight) hours. Patient taking differently: Take 100 mg by mouth 2 (two) times daily.  08/06/19   Gerlene Fee, NP  Probiotic CAPS Take 1 capsule by mouth 2 (two) times daily. 10/21/19   [provider]  prochlorperazine (COMPAZINE) 10 MG tablet Take 1 tablet (10 mg total) by mouth every 6 (six) hours as needed (Nausea or vomiting). Patient not taking: Reported on 12/10/2019 08/13/19    Derek Jack, MD  propranolol (INDERAL) 40 MG tablet Take 1 tablet (40 mg total) by mouth 2 (two) times daily. 08/06/19   Gerlene Fee, NP  riTUXimab in sodium chloride 0.9 % 250 mL Inject into the vein every 21 ( twenty-one) days. 08/20/19   [provider]  tamsulosin (FLOMAX) 0.4 MG CAPS capsule Take 1 capsule (0.4 mg total) by mouth daily after supper. 11/18/19   Franchot Gallo, MD  temazepam (RESTORIL) 15 MG capsule Take 1 capsule (15 mg total) by mouth at bedtime. 10/07/19   Barton Dubois, MD  topiramate (TOPAMAX) 100 MG tablet Take 2 tablets (200 mg total) by mouth at bedtime. 08/06/19  Gerlene Fee, NP  vinCRIStine 2 mg in sodium chloride 0.9 % 50 mL Inject 2 mg into the vein every 21 ( twenty-one) days. 08/20/19   [provider]    Physical Exam: Vitals:   01/15/20 1800 01/15/20 1815 01/15/20 1821 01/15/20 1830  BP: (!) 125/55   (!) 118/58  Pulse:    97  Resp: (!) 21 17  (!) 22  Temp:   (!) 101.4 F (38.6 C)   TempSrc:   Rectal   SpO2:    92%  Weight:      Height:        Constitutional: NAD, calm, comfortable Vitals:   01/15/20 1800 01/15/20 1815 01/15/20 1821 01/15/20 1830  BP: (!) 125/55   (!) 118/58  Pulse:    97  Resp: (!) 21 17  (!) 22  Temp:   (!) 101.4 F (38.6 C)   TempSrc:   Rectal   SpO2:    92%  Weight:      Height:       Eyes: PERRL, lids and conjunctivae normal ENMT: Mucous membranes are dry.  Not fully able to appreciate posterior pharynx due to habitus, appreciable  exudate or lesions.  Erythema to left ear.  Neck: normal, supple, no masses, no thyromegaly Respiratory: Normal respiratory effort. No accessory muscle use.  Cardiovascular: Regular rate and rhythm, No extremity edema. 2+ pedal pulses. No carotid bruits.  Abdomen: no tenderness, no masses palpated. No hepatosplenomegaly. Bowel sounds positive.  Musculoskeletal: no clubbing / cyanosis. No joint deformity upper and lower extremities. Good ROM, no  contractures. Normal muscle tone.  Skin: no rashes, lesions, ulcers. No induration Neurologic: CN 2-12 grossly intact. Strength 5/5 in all 4.  Psychiatric: Normal judgment and insight. Alert and oriented x 3. Normal mood.   Labs on Admission: I have personally reviewed following labs and imaging studies  CBC: Recent Labs  Lab 01/15/20 1809  WBC 22.0*  NEUTROABS 19.2*  HGB 9.5*  HCT 30.6*  MCV 103.4*  PLT 99991111   Basic Metabolic Panel: Recent Labs  Lab 01/15/20 1809  NA 137  K 3.9  CL 104  CO2 25  GLUCOSE 139*  BUN 22  CREATININE 1.64*  CALCIUM 8.6*   Liver Function Tests: Recent Labs  Lab 01/15/20 1809  AST 15  ALT 17  ALKPHOS 103  BILITOT 0.6  PROT 6.6  ALBUMIN 3.1*   Coagulation Profile: Recent Labs  Lab 01/15/20 1809  INR 1.7*  Urine analysis:    Component Value Date/Time   COLORURINE YELLOW 01/15/2020 1741   APPEARANCEUR CLOUDY (A) 01/15/2020 1741   LABSPEC 1.011 01/15/2020 1741   PHURINE 5.0 01/15/2020 1741   GLUCOSEU NEGATIVE 01/15/2020 1741   HGBUR SMALL (A) 01/15/2020 1741   BILIRUBINUR NEGATIVE 01/15/2020 1741   KETONESUR NEGATIVE 01/15/2020 1741   PROTEINUR 100 (A) 01/15/2020 1741   UROBILINOGEN 1.0 11/18/2014 1238   NITRITE POSITIVE (A) 01/15/2020 1741   LEUKOCYTESUR LARGE (A) 01/15/2020 1741    Radiological Exams on Admission: DG Chest 1 View  Result Date: 01/15/2020 CLINICAL DATA:  Fever and cough EXAM: CHEST  1 VIEW COMPARISON:  08/26/2019 FINDINGS: Cardiac shadow is within normal limits. Left chest wall port is again seen and stable. Postsurgical changes in the cervical spine are noted. The lungs are well aerated bilaterally without focal infiltrate or sizable effusion. No bony abnormality is noted. IMPRESSION: No active disease. Electronically Signed   By: Inez Catalina M.D.   On: 01/15/2020 18:48  CT Head Wo Contrast  Result Date: 01/15/2020 CLINICAL DATA:  Minor head trauma from fall. EXAM: CT HEAD WITHOUT CONTRAST TECHNIQUE:  Contiguous axial images were obtained from the base of the skull through the vertex without intravenous contrast. COMPARISON:  August 15, 2019. FINDINGS: Brain: No evidence of acute infarction, hemorrhage, hydrocephalus, extra-axial collection or mass lesion/mass effect. Subacute or chronic -appearing lacunar infarctions in the left insular ribbon and right Peri basal ganglia deep white matter. Vascular: No hyperdense vessel. Intracranial calcified atherosclerosis. Skull: Normal. Sinuses/Orbits: No traumatic air-fluid levels in the paranasal sinuses. Mild bilateral maxillary and ethmoidal mucosal thickening. Blunting of the right internal auditory canal. Other: Debris, probably cerumen in the left external auditory canal. Pneumatized mastoid air cells and middle ears. IMPRESSION: No acute intracranial or skull injury. Mild cerebral atrophy and microvascular white matter ischemic changes with bilateral subacute or chronic -appearing lacunar infarction and intracranial calcified atherosclerosis. Blunting of the right internal auditory canal, which questions a possible acute pharyngitis or right otitis media. Mild paranasal sinus mucoperiosteal disease. Electronically Signed   By: Revonda Humphrey   On: 01/15/2020 19:48    EKG: Independently reviewed.  Sinus rhythm.  Atrial premature complexes.  QTc 433.  No change compared to prior EKG.  Assessment/Plan Active Problems:   Sepsis ([place])   Sepsis- likely urinary source, with temperature of  101.4, with leukocytosis of 22 (baseline chronic leukocytosis 2/24 - 13), with normal lactic acid of 1.3.  Reported symptoms of productive cough, no dyspnea, O2 sats 90 to 98% on 2 L O2.  Portable chest x-ray unremarkable.  Recent urine cultures show Proteus pansensitive except to nitrofurantoin. -Continue cefepime started in the ED -Follow-up blood and urine cultures -Obtain influenza panel, follow-up Covid 19 PCR -BMP, CBC a.m.  -500 mill bolus, continue N/s 100cc/hr x 15  hrs -Mucolytic's -Pending clinical course, ?? need for repeat chest imaging.  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 Delton Coombes.  Currently on chemotherapy last dose 2/26. -Follow-up as outpatient.  Hypertension-systolic now [id number], up to 151/64. -Hold home antihypertensives-lisinopril 20, propranolol 40, for now in the setting of soft blood pressure and sepsis -IV fluids.  Generalized weakness and fall-due to acute infection.  Head CT negative for acute abnormality acute pharyngitis or right otitis media.  Exam shows erythema to the left inner part of the outer ear. -May benefit from physical therapy evaluation prior to discharge.  Diabetes mellitus-random glucose 138. 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 Disposition Plan: > 2 days Consults called: None Admission status: Inpatient, telemetry I certify that at the point of admission it is my clinical judgment that the patient will require inpatient hospital care spanning beyond 2 midnights from the point of admission due to high intensity of service, high risk for further deterioration and high frequency of surveillance required.     Bethena Roys MD [facility]  01/15/2020, 10:04 PM

## 2020-01-15 NOTE — Progress Notes (Signed)
Pharmacy Antibiotic Note  Daniel Richards is a 74 y.o. male admitted on 01/15/2020 with fever. He has a history of B-cell lymphoma currently being treated w/chemotherapy, presented to the ED with body aches, chills, headache, coughing, fever of 103.3 with thermometer at home. Pharmacy has been consulted for cefepime dosing.  Plan: Cefepime 2g IV q12h Monitor clinical picture and renal function F/U C&S, abx deescalation / LOT  Height: 5\' 9"  (175.3 cm) Weight: 163 lb 2.3 oz (74 kg) IBW/kg (Calculated) : 70.7  Temp (24hrs), Avg:100.6 F (38.1 C), Min:99.8 F (37.7 C), Max:101.4 F (38.6 C)  Recent Labs  Lab 01/15/20 1809  WBC 22.0*  CREATININE 1.64*  LATICACIDVEN 1.3    Estimated Creatinine Clearance: 38.9 mL/min (A) (by C-G formula based on SCr of 1.64 mg/dL (H)).    Allergies  Allergen Reactions  . Vancomycin Hives    Patient received Benadryl to treat the hives     Thank you for allowing pharmacy to be a part of this patient's care.  Efraim Kaufmann, PharmD, BCPS 01/15/2020 8:38 PM

## 2020-01-16 DIAGNOSIS — A419 Sepsis, unspecified organism: Principal | ICD-10-CM

## 2020-01-16 LAB — CBC
HCT: 29 % — ABNORMAL LOW (ref 39.0–52.0)
Hemoglobin: 8.9 g/dL — ABNORMAL LOW (ref 13.0–17.0)
MCH: 31.8 pg (ref 26.0–34.0)
MCHC: 30.7 g/dL (ref 30.0–36.0)
MCV: 103.6 fL — ABNORMAL HIGH (ref 80.0–100.0)
Platelets: 231 10*3/uL (ref 150–400)
RBC: 2.8 MIL/uL — ABNORMAL LOW (ref 4.22–5.81)
RDW: 14.9 % (ref 11.5–15.5)
WBC: 16.7 10*3/uL — ABNORMAL HIGH (ref 4.0–10.5)
nRBC: 0 % (ref 0.0–0.2)

## 2020-01-16 LAB — GLUCOSE, CAPILLARY
Glucose-Capillary: 104 mg/dL — ABNORMAL HIGH (ref 70–99)
Glucose-Capillary: 127 mg/dL — ABNORMAL HIGH (ref 70–99)
Glucose-Capillary: 128 mg/dL — ABNORMAL HIGH (ref 70–99)
Glucose-Capillary: 83 mg/dL (ref 70–99)
Glucose-Capillary: 124 mg/dL — ABNORMAL HIGH (ref 70–99)

## 2020-01-16 LAB — BASIC METABOLIC PANEL
Anion gap: 8 (ref 5–15)
BUN: 24 mg/dL — ABNORMAL HIGH (ref 8–23)
CO2: 25 mmol/L (ref 22–32)
Calcium: 8.4 mg/dL — ABNORMAL LOW (ref 8.9–10.3)
Chloride: 106 mmol/L (ref 98–111)
Creatinine, Ser: 1.6 mg/dL — ABNORMAL HIGH (ref 0.61–1.24)
GFR calc Af Amer: 48 mL/min — ABNORMAL LOW (ref >60)
GFR calc non Af Amer: 42 mL/min — ABNORMAL LOW (ref >60)
Glucose, Bld: 97 mg/dL (ref 70–99)
Potassium: 3.7 mmol/L (ref 3.5–5.1)
Sodium: 139 mmol/L (ref 135–145)

## 2020-01-16 LAB — SEDIMENTATION RATE: Sed Rate: 76 mm/hr — ABNORMAL HIGH (ref 0–16)

## 2020-01-16 LAB — SARS CORONAVIRUS 2 (TAT 6-24 HRS): SARS Coronavirus 2: NEGATIVE

## 2020-01-16 LAB — PROCALCITONIN: Procalcitonin: 2.57 ng/mL

## 2020-01-16 LAB — HEMOGLOBIN A1C
Hgb A1c MFr Bld: 6.6 % — ABNORMAL HIGH (ref 4.8–5.6)
Mean Plasma Glucose: 142.72 mg/dL

## 2020-01-16 LAB — C-REACTIVE PROTEIN: CRP: 18.1 mg/dL — ABNORMAL HIGH (ref <1.0)

## 2020-01-16 MED ORDER — CHLORHEXIDINE GLUCONATE CLOTH 2 % EX PADS
6.0000 | MEDICATED_PAD | Freq: Every day | CUTANEOUS | Status: DC
  Administered 2020-01-16 – 2020-01-17 (×2): 6 via TOPICAL

## 2020-01-16 NOTE — Plan of Care (Signed)
  Problem: Acute Rehab PT Goals(only PT should resolve) Goal: Pt Will Go Supine/Side To Sit Outcome: Progressing Flowsheets (Taken 01/16/2020 1205) Pt will go Supine/Side to Sit: with min guard assist Goal: Pt Will Go Sit To Supine/Side Outcome: Progressing Flowsheets (Taken 01/16/2020 1205) Pt will go Sit to Supine/Side: with min guard assist Goal: Patient Will Transfer Sit To/From Stand Outcome: Progressing Flowsheets (Taken 01/16/2020 1205) Patient will transfer sit to/from stand: with min guard assist Goal: Pt Will Transfer Bed To Chair/Chair To Bed Outcome: Progressing Flowsheets (Taken 01/16/2020 1205) Pt will Transfer Bed to Chair/Chair to Bed: min guard assist Goal: Pt Will Ambulate Outcome: Progressing Flowsheets (Taken 01/16/2020 1205) Pt will Ambulate:  50 feet  with min guard assist  with rolling walker Goal: Pt/caregiver will Perform Home Exercise Program Outcome: Progressing Flowsheets (Taken 01/16/2020 1205) Pt/caregiver will Perform Home Exercise Program:  For increased strengthening  For improved balance  Independently  With Supervision, verbal cues required/provided   12:07 PM, 01/16/20 Mearl Latin PT, DPT Physical Therapist at [facility]

## 2020-01-16 NOTE — Care Management Important Message (Signed)
Important Message  Patient Details  Name: Daniel Richards MRN: [id number] Date of Birth: 1946-07-07   Medicare Important Message Given:  Yes     [name] 01/16/2020, 2:46 PM

## 2020-01-16 NOTE — Progress Notes (Addendum)
PROGRESS NOTE    Daniel Richards  PJK:[id number] DOB: 17-Jan-1946 DOA: 01/15/2020 PCP: [name], FNP   Brief Narrative:  Per HPI:  Daniel Richards is a 74 y.o. male with medical history significant for B-cell lymphoma, CKD 3, hypertension, depression and diabetes mellitus.  Patient presented to the ED with complaints of productive cough, chills, body aches, and fever started last night.  Temp of 103 was recorded at home.  Patient is also on chemotherapy.  Patient also reports generalized weakness, causing him to fall yesterday and hit his head when he was walking to the bathroom with his walker last night. Cough is productive of yellowish sputum, present over the past week but particularly worse last night.  He denies pain with urination.  No difficulty breathing.  No loose stools no abdominal pain.  Reports compliance with all his medications but does not know the names of what he takes. Reports pain today in his left ear, but his right ear is fine.  3/12: Patient was admitted with sepsis secondary to UTI as well as presumed pneumonia.  He has been started on cefepime with blood and urine cultures pending.  He appears to be doing better this morning with no fevers noted.  Blood pressures are still remaining soft.  We will continue to monitor and wean oxygen as tolerated.   Assessment & Plan:   Active Problems:   Sepsis ([place])   Sepsis secondary to UTI as well as presumed pneumonia -Chronic foley; will exchange catheter today since it has been a month -Follow-up blood and urine cultures -Covid testing pending -Continue mucolytic's -Hold further IV fluid at this time -Continue monitoring repeat labs as well as CRP/ESR and procalcitonin  Hypertension-soft -Holding home lisinopril and propranolol -Hold IV fluids for now  Stage IV diffuse large B-cell lymphoma -Immunocompromised on current chemotherapy with last dose 2/26 -Follow-up with Dr. Delton Coombes  outpatient  Generalized weakness and fall -CT head negative -PT evaluation by tomorrow  Type 2 diabetes -Continue SSI with stable control -Hemoglobin A1c 6.6%  Depression/anxiety/insomnia/essential tremors -Holding home primidone, gabapentin, propranolol  History of PE -Continue home Eliquis  BPH -Tamsulosin   DVT prophylaxis: Eliquis Code Status: Full code Family Communication: None at bedside, but will call Disposition Plan: Continue current treatments and monitor cultures.  PT evaluation.   Consultants:   None  Procedures:   See below  Antimicrobials:  Anti-infectives (From admission, onward)   Start     Dose/Rate Route Frequency Ordered Stop   01/16/20 1000  ceFEPIme (MAXIPIME) 2 g in sodium chloride 0.9 % 100 mL IVPB     2 g 200 mL/hr over 30 Minutes Intravenous Every 12 hours 01/15/20 2037     01/15/20 2000  ceFEPIme (MAXIPIME) 2 g in sodium chloride 0.9 % 100 mL IVPB     2 g 200 mL/hr over 30 Minutes Intravenous  Once 01/15/20 1954 01/15/20 2055   01/15/20 2000  metroNIDAZOLE (FLAGYL) IVPB 500 mg     500 mg 100 mL/hr over 60 Minutes Intravenous  Once 01/15/20 1954 01/15/20 2207        Subjective: Patient seen and evaluated today with no new acute complaints or concerns. No acute concerns or events noted overnight.  He continues to have a mild cough.  Objective: Vitals:   01/15/20 2100 01/15/20 2208 01/15/20 2250 01/16/20 0543  BP: (!) 102/50 (!) 95/55 108/68 (!) 103/47  Pulse: 91 89 92 83  Resp: 19 18 18 18   Temp:  99.1 F (37.3 C) 99.6 F (37.6 C) 98.2 F (36.8 C)  TempSrc:  Oral  Oral  SpO2: 98% 99% 97% 99%  Weight:      Height:        Intake/Output Summary (Last 24 hours) at 01/16/2020 1008 Last data filed at 01/16/2020 0956 Gross per 24 hour  Intake 1238.3 ml  Output 475 ml  Net 763.3 ml   Filed Weights   01/15/20 1630  Weight: 74 kg    Examination:  General exam: Appears calm and comfortable  Respiratory system: Congested  and currently on 2 L nasal cannula oxygen. Cardiovascular system: S1 & S2 heard, RRR. No JVD, murmurs, rubs, gallops or clicks. No pedal edema. Gastrointestinal system: Abdomen is nondistended, soft and nontender. No organomegaly or masses felt. Normal bowel sounds heard. Central nervous system: Alert and oriented. No focal neurological deficits. Extremities: Symmetric 5 x 5 power. Skin: No rashes, lesions or ulcers Psychiatry: Judgement and insight appear normal. Mood & affect appropriate.     Data Reviewed: I have personally reviewed following labs and imaging studies  CBC: Recent Labs  Lab 01/15/20 1809 01/16/20 0437  WBC 22.0* 16.7*  NEUTROABS 19.2*  --   HGB 9.5* 8.9*  HCT 30.6* 29.0*  MCV 103.4* 103.6*  PLT 265 374   Basic Metabolic Panel: Recent Labs  Lab 01/15/20 1809 01/16/20 0437  NA 137 139  K 3.9 3.7  CL 104 106  CO2 25 25  GLUCOSE 139* 97  BUN 22 24*  CREATININE 1.64* 1.60*  CALCIUM 8.6* 8.4*   GFR: Estimated Creatinine Clearance: 39.9 mL/min (A) (by C-G formula based on SCr of 1.6 mg/dL (H)). Liver Function Tests: Recent Labs  Lab 01/15/20 1809  AST 15  ALT 17  ALKPHOS 103  BILITOT 0.6  PROT 6.6  ALBUMIN 3.1*   No results for input(s): LIPASE, AMYLASE in the last 168 hours. No results for input(s): AMMONIA in the last 168 hours. Coagulation Profile: Recent Labs  Lab 01/15/20 1809  INR 1.7*   Cardiac Enzymes: No results for input(s): CKTOTAL, CKMB, CKMBINDEX, TROPONINI in the last 168 hours. BNP (last 3 results) No results for input(s): PROBNP in the last 8760 hours. HbA1C: Recent Labs    01/16/20 0437  HGBA1C 6.6*   CBG: Recent Labs  Lab 01/15/20 2249 01/16/20 0746  GLUCAP 104* 104*   Lipid Profile: No results for input(s): CHOL, HDL, LDLCALC, TRIG, CHOLHDL, LDLDIRECT in the last 72 hours. Thyroid Function Tests: No results for input(s): TSH, T4TOTAL, FREET4, T3FREE, THYROIDAB in the last 72 hours. Anemia Panel: No results  for input(s): VITAMINB12, FOLATE, FERRITIN, TIBC, IRON, RETICCTPCT in the last 72 hours. Sepsis Labs: Recent Labs  Lab 01/15/20 1809 01/15/20 1942 01/16/20 0723  PROCALCITON  --   --  2.57  LATICACIDVEN 1.3 1.0  --     Recent Results (from the past 240 hour(s))  Blood Culture (routine x 2)     Status: None (Preliminary result)   Collection Time: 01/15/20  6:09 PM   Specimen: BLOOD RIGHT HAND  Result Value Ref Range Status   Specimen Description BLOOD RIGHT HAND  Final   Special Requests   Final    BOTTLES DRAWN AEROBIC AND ANAEROBIC Blood Culture adequate volume   Culture   Final    NO GROWTH < 12 HOURS Performed at [facility], [address] [place], [place] [address]    Report Status PENDING  Incomplete  Blood Culture (routine x 2)  Status: None (Preliminary result)   Collection Time: 01/15/20  6:11 PM   Specimen: Left Antecubital; Blood  Result Value Ref Range Status   Specimen Description LEFT ANTECUBITAL  Final   Special Requests   Final    BOTTLES DRAWN AEROBIC AND ANAEROBIC Blood Culture adequate volume   Culture   Final    NO GROWTH < 12 HOURS Performed at [facility], [address] [place], [place] [address]    Report Status PENDING  Incomplete         Radiology Studies: DG Chest 1 View  Result Date: 01/15/2020 CLINICAL DATA:  Fever and cough EXAM: CHEST  1 VIEW COMPARISON:  08/26/2019 FINDINGS: Cardiac shadow is within normal limits. Left chest wall port is again seen and stable. Postsurgical changes in the cervical spine are noted. The lungs are well aerated bilaterally without focal infiltrate or sizable effusion. No bony abnormality is noted. IMPRESSION: No active disease. Electronically Signed   By: Inez Catalina M.D.   On: 01/15/2020 18:48   CT Head Wo Contrast  Result Date: 01/15/2020 CLINICAL DATA:  Minor head trauma from fall. EXAM: CT HEAD WITHOUT CONTRAST TECHNIQUE: Contiguous axial images were obtained from the base of the skull through the  vertex without intravenous contrast. COMPARISON:  August 15, 2019. FINDINGS: Brain: No evidence of acute infarction, hemorrhage, hydrocephalus, extra-axial collection or mass lesion/mass effect. Subacute or chronic -appearing lacunar infarctions in the left insular ribbon and right Peri basal ganglia deep white matter. Vascular: No hyperdense vessel. Intracranial calcified atherosclerosis. Skull: Normal. Sinuses/Orbits: No traumatic air-fluid levels in the paranasal sinuses. Mild bilateral maxillary and ethmoidal mucosal thickening. Blunting of the right internal auditory canal. Other: Debris, probably cerumen in the left external auditory canal. Pneumatized mastoid air cells and middle ears. IMPRESSION: No acute intracranial or skull injury. Mild cerebral atrophy and microvascular white matter ischemic changes with bilateral subacute or chronic -appearing lacunar infarction and intracranial calcified atherosclerosis. Blunting of the right internal auditory canal, which questions a possible acute pharyngitis or right otitis media. Mild paranasal sinus mucoperiosteal disease. Electronically Signed   By: Revonda Humphrey   On: 01/15/2020 19:48        Scheduled Meds: . apixaban  5 mg Oral BID  . Chlorhexidine Gluconate Cloth  6 each Topical Daily  . citalopram  20 mg Oral QHS  . feeding supplement (ENSURE ENLIVE)  237 mL Oral BID BM  . folic acid  1 mg Oral Daily  . gabapentin  600 mg Oral TID  . insulin aspart  0-5 Units Subcutaneous QHS  . insulin aspart  0-9 Units Subcutaneous TID WC  . pantoprazole  40 mg Oral BID AC  . primidone  75 mg Oral Q8H  . tamsulosin  0.4 mg Oral QPC supper   Continuous Infusions: . ceFEPime (MAXIPIME) IV 2 g (01/16/20 0942)     LOS: 1 day    Time spent: 30 minutes    Opha Mcghee Darleen Crocker, DO [facility]  If 7PM-7AM, please contact night-coverage [email or web address] 01/16/2020, 10:08 AM

## 2020-01-16 NOTE — Progress Notes (Signed)
Initial Nutrition Assessment  DOCUMENTATION CODES:   Not applicable  INTERVENTION:  Continue Ensure Enlive po BID, each supplement provides 350 kcal and 20 grams of protein  Recommend regular diet  Please provide feeding assistance with all meals  NUTRITION DIAGNOSIS:   Increased nutrient needs related to cancer and cancer related treatments as evidenced by estimated needs.    GOAL:   Patient will meet greater than or equal to 90% of their needs    MONITOR:   PO intake, Weight trends, Supplement acceptance, Diet advancement, I & O's, Labs, Skin  REASON FOR ASSESSMENT:   Malnutrition Screening Tool    ASSESSMENT:  RD working remotely.  74 year old male with past medical history significant of B-cell lymphoma currently on chemotherapy, CKD stage III, HTN, depression and anxiety, DM2, HLD presented with complaints of generalized weakness with fall, productive cough of yellowish white sputum, chills, body aches, and fever.  Patient admitted on 3/11 for sepsis secondary to UTI as well as presumed pneumonia.  2/26 - chemotherapy  Per notes: -chronic foley, catheter exchange today -Covid test pending -monitor blood and urine cultures -CT head negative -limited functional mobility per PT eval  Current wt 162.8 lbs Per history, weights have been trending down over the past 4 months. On 11/09 pt wt 189.64 lbs, on 1/06 pt wt 166.1 lbs, on 2/03 pt wt 161.92 lbs. This indicates a 26.84 lb wt loss (14.2%) in 4 months which is severe for time frame.  Patient seen by nutrition department during previous admission in November. Patient reported self feeding difficulties secondary to tremors, feeding assistance provided at home by daughter and son-in-law. Recommend feeding assistance with meals to maximize po intake.  Given recent downtrends in wt and history of present illness highly suspect malnutrition, however unable to identify at this time without NFPE and/or dietary recall.  Per medications, patient is receiving Ensure twice daily. Patient is on a HH/CM diet, per flowsheets he is eating 0-75% of meals at this time. Noted CBGs 83-128 this admission, recommend liberalizing diet to regular to encourage po intake.  Medications reviewed and include: Folic acid, Gabapentin, SSI, Protonix Maxipime  Labs: CBGs 83, 127, 128, 104, BUN 24 (H), Cr 1.60 (H), WBC 16.7 (H) Lab Results  Component Value Date   HGBA1C 6.6 (H) 01/16/2020    NUTRITION - FOCUSED PHYSICAL EXAM: Unable to complete at this time, RD working remotely.   Diet Order:   Diet Order            Diet heart healthy/carb modified Room service appropriate? Yes; Fluid consistency: Thin  Diet effective now              EDUCATION NEEDS:   No education needs have been identified at this time  Skin:  Skin Assessment: Skin Integrity Issues: Skin Integrity Issues:: Other (Comment) Other: MASD; sacrum  Last BM:  3/12 type 5  Height:   Ht Readings from Last 1 Encounters:  01/15/20 5\' 9"  (1.753 m)    Weight:   Wt Readings from Last 1 Encounters:  01/15/20 74 kg    BMI:  Body mass index is 24.09 kg/m.  Estimated Nutritional Needs:   Kcal:  2000-2200  Protein:  100-110  Fluid:  >/= 1.8 L/day   Lajuan Lines, RD, LDN Clinical Nutrition After Hours/Weekend Pager # in [place]

## 2020-01-16 NOTE — Evaluation (Signed)
Physical Therapy Evaluation Patient Details Name: Daniel Richards MRN: [id number] DOB: April 22, 1946 Today's Date: 01/16/2020   History of Present Illness  Daniel Richards is a 74 y.o. male with medical history significant for B-cell lymphoma, CKD 3, hypertension, depression and diabetes mellitus.  Patient presented to the ED with complaints of productive cough, chills, body aches, and fever started last night.  Temp of 103 was recorded at home.  Patient is also on chemotherapy.  Patient also reports generalized weakness, causing him to fall yesterday and hit his head when he was walking to the bathroom with his walker last night.Cough is productive of yellowish sputum, present over the past week but particularly worse last night.  He denies pain with urination.  No difficulty breathing.  No loose stools no abdominal pain.  Reports compliance with all his medications but does not know the names of what he takes.Reports pain today in his left ear, but his right ear is fine.    Clinical Impression  Patient limited for functional mobility as stated below secondary to BLE weakness, fatigue and poor standing balance. He requires min assist for bed mobility and to transfer to standing. He shows good seated balance on EOB and at toilet. He ambulates with slow labored cadence and requires frequent verbal cueing for upright posture. He requires some verbal cueing for hand placement on RW. He demonstrates some unsteadiness while standing/ambulating with use of RW and requires assist for balance and safety. Patient demonstrates impaired activity tolerance throughout session. He is educated on HHPT and possible follow up in future with outpatient physical therapy if weakness continues. Patient will benefit from continued physical therapy in hospital and recommended venue below to increase strength, balance, endurance for safe ADLs and gait.     Follow Up Recommendations Home health PT;Supervision/Assistance - 24  hour;Supervision for mobility/OOB    Equipment Recommendations  None recommended by PT    Recommendations for Other Services       Precautions / Restrictions Precautions Precautions: Fall Restrictions Weight Bearing Restrictions: No      Mobility  Bed Mobility Overal bed mobility: Needs Assistance Bed Mobility: Rolling;Supine to Sit;Sit to Supine Rolling: Min guard   Supine to sit: Min assist Sit to supine: Min assist   General bed mobility comments: transition to seated EOB  Transfers Overall transfer level: Needs assistance Equipment used: Rolling walker (2 wheeled) Transfers: Sit to/from [facility] Sit to Stand: Min assist Stand pivot transfers: Min assist       General transfer comment: using RW and physical assist to transition to standing to/from bed and toilet x2  Ambulation/Gait Ambulation/Gait assistance: Min assist Gait Distance (Feet): 30 Feet Assistive device: Rolling walker (2 wheeled) Gait Pattern/deviations: Trunk flexed;Step-through pattern;Decreased step length - right;Decreased step length - left;Decreased stride length Gait velocity: decreased   General Gait Details: slow, labored cadenece, verbal cueing for posture, assist for balance, safety and use of RW  Stairs            Wheelchair Mobility    Modified Rankin (Stroke Patients Only)       Balance Overall balance assessment: Needs assistance Sitting-balance support: Feet supported;No upper extremity supported Sitting balance-Leahy Scale: Good Sitting balance - Comments: seated EOB     Standing balance-Leahy Scale: Fair Standing balance comment: using RW                             Pertinent Vitals/Pain Pain Assessment:  Faces Faces Pain Scale: Hurts little more Pain Location: headache Pain Intervention(s): Limited activity within patient's tolerance;Monitored during session    [place] expects to be discharged to::  Private residence Living Arrangements: Children Available Help at Discharge: Family;Available 24 hours/day Type of Home: House Home Access: Ramped entrance     Home Layout: One level Home Equipment: Cane - single point;Walker - 2 wheels;Grab bars - tub/shower;Bedside commode;Shower seat      Prior Function Level of Independence: Needs assistance   Gait / Transfers Assistance Needed: Patient states household ambulator with RW  ADL's / Homemaking Assistance Needed: assisted by daughter  Comments: pt has been living with daughter since d/c from last hospital stay due to assistance needed     Hand Dominance   Dominant Hand: Right    Extremity/Trunk Assessment   Upper Extremity Assessment Upper Extremity Assessment: Generalized weakness    Lower Extremity Assessment Lower Extremity Assessment: Generalized weakness    Cervical / Trunk Assessment Cervical / Trunk Assessment: Kyphotic  Communication   Communication: No difficulties  Cognition Arousal/Alertness: Awake/alert Behavior During Therapy: WFL for tasks assessed/performed Overall Cognitive Status: Within Functional Limits for tasks assessed                                        General Comments      Exercises     Assessment/Plan    PT Assessment Patient needs continued PT services  PT Problem List Decreased strength;Decreased balance;Decreased activity tolerance;Decreased mobility;Pain       PT Treatment Interventions DME instruction;Gait training;Balance training;Neuromuscular re-education;Functional mobility training;Patient/family education;Therapeutic activities;Therapeutic exercise    PT Goals (Current goals can be found in the Care Plan section)  Acute Rehab PT Goals Patient Stated Goal: return home with family to assist PT Goal Formulation: With patient Time For Goal Achievement: 01/30/20 Potential to Achieve Goals: Fair    Frequency Min 3X/week   Barriers to discharge         Co-evaluation               AM-PAC PT "6 Clicks" Mobility  Outcome Measure Help needed turning from your back to your side while in a flat bed without using bedrails?: None Help needed moving from lying on your back to sitting on the side of a flat bed without using bedrails?: A Little Help needed moving to and from a bed to a chair (including a wheelchair)?: A Little Help needed standing up from a chair using your arms (e.g., wheelchair or bedside chair)?: A Little Help needed to walk in hospital room?: A Little Help needed climbing 3-5 steps with a railing? : A Lot 6 Click Score: 18    End of Session Equipment Utilized During Treatment: Gait belt Activity Tolerance: Patient tolerated treatment well Patient left: in bed;with call bell/phone within reach;with bed alarm set Nurse Communication: Mobility status PT Visit Diagnosis: Unsteadiness on feet (R26.81);Other abnormalities of gait and mobility (R26.89);Muscle weakness (generalized) (M62.81);History of falling (Z91.81)    Time: [id number] PT Time Calculation (min) (ACUTE ONLY): 41 min   Charges:   PT Evaluation $PT Eval Moderate Complexity: 1 Mod PT Treatments $Therapeutic Activity: 23-37 mins       12:03 PM, 01/16/20 Mearl Latin PT, DPT Physical Therapist at [facility]

## 2020-01-17 LAB — BASIC METABOLIC PANEL
Anion gap: 10 (ref 5–15)
BUN: 21 mg/dL (ref 8–23)
CO2: 24 mmol/L (ref 22–32)
Calcium: 8.6 mg/dL — ABNORMAL LOW (ref 8.9–10.3)
Chloride: 104 mmol/L (ref 98–111)
Creatinine, Ser: 1.56 mg/dL — ABNORMAL HIGH (ref 0.61–1.24)
GFR calc Af Amer: 50 mL/min — ABNORMAL LOW (ref >60)
GFR calc non Af Amer: 43 mL/min — ABNORMAL LOW (ref >60)
Glucose, Bld: 104 mg/dL — ABNORMAL HIGH (ref 70–99)
Potassium: 3.3 mmol/L — ABNORMAL LOW (ref 3.5–5.1)
Sodium: 138 mmol/L (ref 135–145)

## 2020-01-17 LAB — GLUCOSE, CAPILLARY
Glucose-Capillary: 110 mg/dL — ABNORMAL HIGH (ref 70–99)
Glucose-Capillary: 152 mg/dL — ABNORMAL HIGH (ref 70–99)
Glucose-Capillary: 66 mg/dL — ABNORMAL LOW (ref 70–99)
Glucose-Capillary: 131 mg/dL — ABNORMAL HIGH (ref 70–99)
Glucose-Capillary: 130 mg/dL — ABNORMAL HIGH (ref 70–99)

## 2020-01-17 LAB — PROCALCITONIN: Procalcitonin: 1.79 ng/mL

## 2020-01-17 LAB — URINE CULTURE

## 2020-01-17 LAB — SEDIMENTATION RATE: Sed Rate: 90 mm/hr — ABNORMAL HIGH (ref 0–16)

## 2020-01-17 LAB — CBC
HCT: 28.2 % — ABNORMAL LOW (ref 39.0–52.0)
Hemoglobin: 8.6 g/dL — ABNORMAL LOW (ref 13.0–17.0)
MCH: 31.3 pg (ref 26.0–34.0)
MCHC: 30.5 g/dL (ref 30.0–36.0)
MCV: 102.5 fL — ABNORMAL HIGH (ref 80.0–100.0)
Platelets: 254 10*3/uL (ref 150–400)
RBC: 2.75 MIL/uL — ABNORMAL LOW (ref 4.22–5.81)
RDW: 14.6 % (ref 11.5–15.5)
WBC: 9.2 10*3/uL (ref 4.0–10.5)
nRBC: 0 % (ref 0.0–0.2)

## 2020-01-17 LAB — C-REACTIVE PROTEIN: CRP: 19.1 mg/dL — ABNORMAL HIGH (ref <1.0)

## 2020-01-17 MED ORDER — POTASSIUM CHLORIDE CRYS ER 20 MEQ PO TBCR
40.0000 meq | EXTENDED_RELEASE_TABLET | Freq: Two times a day (BID) | ORAL | Status: AC
  Administered 2020-01-17 (×2): 40 meq via ORAL
  Filled 2020-01-17 (×2): qty 2

## 2020-01-17 MED ORDER — LOPERAMIDE HCL 2 MG PO CAPS
2.0000 mg | ORAL_CAPSULE | Freq: Three times a day (TID) | ORAL | Status: DC
  Administered 2020-01-17: 2 mg via ORAL
  Filled 2020-01-17: qty 1

## 2020-01-17 MED ORDER — PROPRANOLOL HCL 20 MG PO TABS
40.0000 mg | ORAL_TABLET | Freq: Two times a day (BID) | ORAL | Status: DC
  Administered 2020-01-17 – 2020-01-18 (×3): 40 mg via ORAL
  Filled 2020-01-17 (×3): qty 2

## 2020-01-17 MED ORDER — LOPERAMIDE HCL 2 MG PO CAPS
2.0000 mg | ORAL_CAPSULE | Freq: Three times a day (TID) | ORAL | Status: AC
  Administered 2020-01-17 – 2020-01-18 (×3): 2 mg via ORAL
  Filled 2020-01-17 (×3): qty 1

## 2020-01-17 MED ORDER — LACTATED RINGERS IV SOLN: INTRAVENOUS | Status: AC

## 2020-01-17 NOTE — Progress Notes (Signed)
PROGRESS NOTE    Daniel Richards  MRN:[id number] DOB: 06/08/1946 DOA: 01/15/2020 PCP: Nicholson, Sterling J IV, FNP   Brief Narrative:  Per HPI: Daniel Richardsis a 74 y.o.malewith medical history significant forB-cell lymphoma, CKD 3, hypertension, depression and diabetes mellitus. Patient presented to the ED with complaints of productive cough,chills, body aches, and fever started last night.Temp of103 was recorded at home. Patient is also on chemotherapy. Patient also reports generalized weakness, causing him to fall yesterday and hit his head when he was walking to the bathroom with his walker last night. Cough is productive of yellowish sputum, present over the past week but particularly worse last night. He denies pain with urination.No difficulty breathing. No loose stools no abdominal pain. Reports compliance with all his medications but does not know the names of what he takes. Reports paintodayin his left ear, but his right ear is fine.  3/12: Patient was admitted with sepsis secondary to UTI as well as presumed pneumonia.  He has been started on cefepime with blood and urine cultures pending.  He appears to be doing better this morning with no fevers noted.  Blood pressures are still remaining soft.  We will continue to monitor and wean oxygen as tolerated.  3/13: Patient continues to have episodes of fevers and some diarrhea this morning.  He states that the diarrhea has been ongoing for several weeks and denies any abdominal pain.  Continue cefepime through today with urine cultures showing multiple organisms.  Blood cultures negative.  Assessment & Plan:   Active Problems:   Sepsis (HCC)   Sepsis secondary to UTI as well as presumed pneumonia -Chronic foley with recent exchange less than 1 month prior -Blood cultures negative x2 days and urine cultures with multiple organisms -Covid testing negative -Continue mucolytic's -Continue IV fluid, time-limited for  12 hours -Continue monitoring repeat labs as well as CRP/ESR and procalcitonin -Weaned to room air  Hypertension-soft -Holding home lisinopril but resume propranolol -Gentle IV fluids  Chronic diarrhea -Imodium 3 times daily for now -I do not suspect C. difficile  Stage IV diffuse large B-cell lymphoma -Immunocompromised on current chemotherapy with last dose 2/26 -Follow-up with Dr. Katragadda outpatient  Generalized weakness and fall -CT head negative -PT evaluation with recommendations for home health when ready for discharge  Type 2 diabetes -Continue SSI with stable control -Hemoglobin A1c 6.6%  Depression/anxiety/insomnia/essential tremors -Holding home primidone, gabapentin, propranolol  History of PE -Continue home Eliquis  BPH -Tamsulosin   DVT prophylaxis: Eliquis Code Status: Full code Family Communication: None at bedside, but will call Disposition Plan: Continue current IV antibiotics until fever free for 24-hour period.  Anticipate discharge by a.m. with home health PT if improved.   Consultants:   None  Procedures:   See below  Antimicrobials:  Anti-infectives (From admission, onward)   Start     Dose/Rate Route Frequency Ordered Stop   01/16/20 1000  ceFEPIme (MAXIPIME) 2 g in sodium chloride 0.9 % 100 mL IVPB     2 g 200 mL/hr over 30 Minutes Intravenous Every 12 hours 01/15/20 2037     01/15/20 2000  ceFEPIme (MAXIPIME) 2 g in sodium chloride 0.9 % 100 mL IVPB     2 g 200 mL/hr over 30 Minutes Intravenous  Once 01/15/20 1954 01/15/20 2055   01/15/20 2000  metroNIDAZOLE (FLAGYL) IVPB 500 mg     500 mg 100 mL/hr over 60 Minutes Intravenous  Once 01/15/20 1954 01/15/20 2207         Subjective: Patient seen and evaluated today with complaints of some diarrhea.  He states he has had loose, liquidy stools with 3 bowel movements overnight.  He denies abdominal pain, nausea, or vomiting.  Objective: Vitals:   01/16/20 2200  01/16/20 2344 01/17/20 0105 01/17/20 0521  BP:    (!) 122/58  Pulse: (!) 109 (!) 107 100 86  Resp:    17  Temp: (!) 102.8 F (39.3 C) (!) 101.4 F (38.6 C) 99.1 F (37.3 C) 98.8 F (37.1 C)  TempSrc:   Oral Oral  SpO2:   95% 91%  Weight:      Height:        Intake/Output Summary (Last 24 hours) at 01/17/2020 0938 Last data filed at 01/17/2020 0150 Gross per 24 hour  Intake 984.86 ml  Output 1800 ml  Net -815.14 ml   Filed Weights   01/15/20 1630  Weight: 74 kg    Examination:  General exam: Appears calm and comfortable  Respiratory system: Clear to auscultation. Respiratory effort normal.  Currently on room air. Cardiovascular system: S1 & S2 heard, RRR. No JVD, murmurs, rubs, gallops or clicks. No pedal edema. Gastrointestinal system: Abdomen is nondistended, soft and nontender. No organomegaly or masses felt. Normal bowel sounds heard. Central nervous system: Alert and oriented. No focal neurological deficits. Extremities: Symmetric 5 x 5 power. Skin: No rashes, lesions or ulcers Psychiatry: Judgement and insight appear normal. Mood & affect appropriate.     Data Reviewed: I have personally reviewed following labs and imaging studies  CBC: Recent Labs  Lab 01/15/20 1809 01/16/20 0437 01/17/20 0601  WBC 22.0* 16.7* 9.2  NEUTROABS 19.2*  --   --   HGB 9.5* 8.9* 8.6*  HCT 30.6* 29.0* 28.2*  MCV 103.4* 103.6* 102.5*  PLT 265 231 786   Basic Metabolic Panel: Recent Labs  Lab 01/15/20 1809 01/16/20 0437 01/17/20 0601  NA 137 139 138  K 3.9 3.7 3.3*  CL 104 106 104  CO2 _0 GLUCOSE 139* 97 104*  BUN 22 24* 21  CREATININE 1.64* 1.60* 1.56*  CALCIUM 8.6* 8.4* 8.6*   GFR: Estimated Creatinine Clearance: 40.9 mL/min (A) (by C-G formula based on SCr of 1.56 mg/dL (H)). Liver Function Tests: Recent Labs  Lab 01/15/20 1809  AST 15  ALT 17  ALKPHOS 103  BILITOT 0.6  PROT 6.6  ALBUMIN 3.1*   No results for input(s): LIPASE, AMYLASE in the last  168 hours. No results for input(s): AMMONIA in the last 168 hours. Coagulation Profile: Recent Labs  Lab 01/15/20 1809  INR 1.7*   Cardiac Enzymes: No results for input(s): CKTOTAL, CKMB, CKMBINDEX, TROPONINI in the last 168 hours. BNP (last 3 results) No results for input(s): PROBNP in the last 8760 hours. HbA1C: Recent Labs    01/16/20 0437  HGBA1C 6.6*   CBG: Recent Labs  Lab 01/16/20 1139 01/16/20 1200 01/16/20 1635 01/16/20 2114 01/17/20 0723  GLUCAP 128* 127* 83 124* 110*   Lipid Profile: No results for input(s): CHOL, HDL, LDLCALC, TRIG, CHOLHDL, LDLDIRECT in the last 72 hours. Thyroid Function Tests: No results for input(s): TSH, T4TOTAL, FREET4, T3FREE, THYROIDAB in the last 72 hours. Anemia Panel: No results for input(s): VITAMINB12, FOLATE, FERRITIN, TIBC, IRON, RETICCTPCT in the last 72 hours. Sepsis Labs: Recent Labs  Lab 01/15/20 1809 01/15/20 1942 01/16/20 0723 01/17/20 0601  PROCALCITON  --   --  2.57 1.79  LATICACIDVEN 1.3 1.0  --   --  Recent Results (from the past 240 hour(s))  Urine culture     Status: Abnormal   Collection Time: 01/15/20  5:41 PM   Specimen: In/Out Cath Urine  Result Value Ref Range Status   Specimen Description   Final    IN/OUT CATH URINE Performed at [facility], [address] [place], [place] [address]    Special Requests   Final    NONE Performed at [facility], [address] [place], [place] [address]    Culture MULTIPLE SPECIES PRESENT, SUGGEST RECOLLECTION (A)  Final   Report Status 01/17/2020 FINAL  Final  Blood Culture (routine x 2)     Status: None (Preliminary result)   Collection Time: 01/15/20  6:09 PM   Specimen: BLOOD RIGHT HAND  Result Value Ref Range Status   Specimen Description BLOOD RIGHT HAND  Final   Special Requests   Final    BOTTLES DRAWN AEROBIC AND ANAEROBIC Blood Culture adequate volume   Culture   Final    NO GROWTH 2 DAYS Performed at [facility], [address]  [place], [place] [address]    Report Status PENDING  Incomplete  Blood Culture (routine x 2)     Status: None (Preliminary result)   Collection Time: 01/15/20  6:11 PM   Specimen: Left Antecubital; Blood  Result Value Ref Range Status   Specimen Description LEFT ANTECUBITAL  Final   Special Requests   Final    BOTTLES DRAWN AEROBIC AND ANAEROBIC Blood Culture adequate volume   Culture   Final    NO GROWTH 2 DAYS Performed at [facility], [address], [place], [place] [address]    Report Status PENDING  Incomplete  SARS CORONAVIRUS 2 (TAT 6-24 HRS) Nasopharyngeal Nasopharyngeal Swab     Status: None   Collection Time: 01/15/20  8:28 PM   Specimen: Nasopharyngeal Swab  Result Value Ref Range Status   SARS Coronavirus 2 NEGATIVE NEGATIVE Final    Comment: (NOTE) SARS-CoV-2 target nucleic acids are NOT DETECTED. The SARS-CoV-2 RNA is generally detectable in upper and lower respiratory specimens during the acute phase of infection. Negative results do not preclude SARS-CoV-2 infection, do not rule out co-infections with other pathogens, and should not be used as the sole basis for treatment or other patient management decisions. Negative results must be combined with clinical observations, patient history, and epidemiological information. The expected result is Negative. Fact Sheet for Patients: SugarRoll.be Fact Sheet for Healthcare Providers: https://www.woods-mathews.com/ This test is not yet approved or cleared by the [place] FDA and  has been authorized for detection and/or diagnosis of SARS-CoV-2 by FDA under an Emergency Use Authorization (EUA). This EUA will remain  in effect (meaning this test can be used) for the duration of the COVID-19 declaration under Section 56 4(b)(1) of the Act, 21 U.S.C. section 360bbb-3(b)(1), unless the authorization is terminated or revoked sooner. Performed at [facility], [place] [address] [place], [place] [address]          Radiology Studies: DG Chest 1 View  Result Date: 01/15/2020 CLINICAL DATA:  Fever and cough EXAM: CHEST  1 VIEW COMPARISON:  08/26/2019 FINDINGS: Cardiac shadow is within normal limits. Left chest wall port is again seen and stable. Postsurgical changes in the cervical spine are noted. The lungs are well aerated bilaterally without focal infiltrate or sizable effusion. No bony abnormality is noted. IMPRESSION: No active disease. Electronically Signed   By: Inez Catalina M.D.   On: 01/15/2020  18:48   CT Head Wo Contrast  Result Date: 01/15/2020 CLINICAL DATA:  Minor head trauma from fall. EXAM: CT HEAD WITHOUT CONTRAST TECHNIQUE: Contiguous axial images were obtained from the base of the skull through the vertex without intravenous contrast. COMPARISON:  August 15, 2019. FINDINGS: Brain: No evidence of acute infarction, hemorrhage, hydrocephalus, extra-axial collection or mass lesion/mass effect. Subacute or chronic -appearing lacunar infarctions in the left insular ribbon and right Peri basal ganglia deep white matter. Vascular: No hyperdense vessel. Intracranial calcified atherosclerosis. Skull: Normal. Sinuses/Orbits: No traumatic air-fluid levels in the paranasal sinuses. Mild bilateral maxillary and ethmoidal mucosal thickening. Blunting of the right internal auditory canal. Other: Debris, probably cerumen in the left external auditory canal. Pneumatized mastoid air cells and middle ears. IMPRESSION: No acute intracranial or skull injury. Mild cerebral atrophy and microvascular white matter ischemic changes with bilateral subacute or chronic -appearing lacunar infarction and intracranial calcified atherosclerosis. Blunting of the right internal auditory canal, which questions a possible acute pharyngitis or right otitis media. Mild paranasal sinus mucoperiosteal disease. Electronically Signed   By: Revonda Humphrey   On: 01/15/2020 19:48        Scheduled  Meds: . apixaban  5 mg Oral BID  . Chlorhexidine Gluconate Cloth  6 each Topical Daily  . citalopram  20 mg Oral QHS  . feeding supplement (ENSURE ENLIVE)  237 mL Oral BID BM  . folic acid  1 mg Oral Daily  . gabapentin  600 mg Oral TID  . insulin aspart  0-5 Units Subcutaneous QHS  . insulin aspart  0-9 Units Subcutaneous TID WC  . loperamide  2 mg Oral TID  . pantoprazole  40 mg Oral BID AC  . potassium chloride  40 mEq Oral BID  . primidone  75 mg Oral Q8H  . propranolol  40 mg Oral BID  . tamsulosin  0.4 mg Oral QPC supper   Continuous Infusions: . ceFEPime (MAXIPIME) IV 2 g (01/17/20 0900)  . lactated ringers       LOS: 2 days    Time spent: 35 minutes    Chene Kasinger Darleen Crocker, DO [facility]  If 7PM-7AM, please contact night-coverage [email or web address] 01/17/2020, 9:38 AM

## 2020-01-17 NOTE — Progress Notes (Signed)
Patient with MEWS of 3 d/t temp of 102.8 and heart rate 109. PRN Tylenol was given MEWS now 2 d/t temp of 101.4 and heart rate of 107. On call MD was notified. No new orders received at this time.         MEWS Guidelines - (patients age 76 and over)  Red - At High Risk for Deterioration Yellow - At risk for Deterioration  1. Go to room and assess patient 2. Validate data. Is this patient's baseline? If data confirmed: 3. Is this an acute change? 4. Administer prn meds/treatments as ordered. 5. Note Sepsis score 6. Review goals of care 7. Sports coach, RRT nurse and Provider. 8. Ask Provider to come to bedside.  9. Document patient condition/interventions/response. 10. Increase frequency of vital signs and focused assessments to at least q15 minutes x 4, then q30 minutes x2. - If stable, then q1h x3, then q4h x3 and then q8h or dept. routine. - If unstable, contact Provider & RRT nurse. Prepare for possible transfer. 11. Add entry in progress notes using the smart phrase ".MEWS". 1. Go to room and assess patient 2. Validate data. Is this patient's baseline? If data confirmed: 3. Is this an acute change? 4. Administer prn meds/treatments as ordered? 5. Note Sepsis score 6. Review goals of care 7. Sports coach and Provider 8. Call RRT nurse as needed. 9. Document patient condition/interventions/response. 10. Increase frequency of vital signs and focused assessments to at least q2h x2. - If stable, then q4h x2 and then q8h or dept. routine. - If unstable, contact Provider & RRT nurse. Prepare for possible transfer. 11. Add entry in progress notes using the smart phrase ".MEWS".  Green - Likely stable Lavender - Comfort Care Only  1. Continue routine/ordered monitoring.  2. Review goals of care. 1. Continue routine/ordered monitoring. 2. Review goals of care.

## 2020-01-17 NOTE — Plan of Care (Signed)
  Problem: Education: Goal: Knowledge of General Education information will improve Description: Including pain rating scale, medication(s)/side effects and non-pharmacologic comfort measures Outcome: Progressing   Problem: Elimination: Goal: Will not experience complications related to urinary retention Outcome: Progressing Note: Patient has a chronic foley catheter.    Problem: Nutrition: Goal: Adequate nutrition will be maintained Outcome: Not Progressing Note: Patient reports decreased appetite

## 2020-01-18 LAB — CBC
HCT: 25.2 % — ABNORMAL LOW (ref 39.0–52.0)
Hemoglobin: 7.8 g/dL — ABNORMAL LOW (ref 13.0–17.0)
MCH: 31.6 pg (ref 26.0–34.0)
MCHC: 31 g/dL (ref 30.0–36.0)
MCV: 102 fL — ABNORMAL HIGH (ref 80.0–100.0)
Platelets: 243 10*3/uL (ref 150–400)
RBC: 2.47 MIL/uL — ABNORMAL LOW (ref 4.22–5.81)
RDW: 14.7 % (ref 11.5–15.5)
WBC: 6.3 10*3/uL (ref 4.0–10.5)
nRBC: 0 % (ref 0.0–0.2)

## 2020-01-18 LAB — BASIC METABOLIC PANEL
Anion gap: 7 (ref 5–15)
BUN: 20 mg/dL (ref 8–23)
CO2: 23 mmol/L (ref 22–32)
Calcium: 8.4 mg/dL — ABNORMAL LOW (ref 8.9–10.3)
Chloride: 108 mmol/L (ref 98–111)
Creatinine, Ser: 1.44 mg/dL — ABNORMAL HIGH (ref 0.61–1.24)
GFR calc Af Amer: 55 mL/min — ABNORMAL LOW (ref >60)
GFR calc non Af Amer: 47 mL/min — ABNORMAL LOW (ref >60)
Glucose, Bld: 89 mg/dL (ref 70–99)
Potassium: 4 mmol/L (ref 3.5–5.1)
Sodium: 138 mmol/L (ref 135–145)

## 2020-01-18 LAB — MAGNESIUM: Magnesium: 2.1 mg/dL (ref 1.7–2.4)

## 2020-01-18 LAB — GLUCOSE, CAPILLARY
Glucose-Capillary: 85 mg/dL (ref 70–99)
Glucose-Capillary: 136 mg/dL — ABNORMAL HIGH (ref 70–99)

## 2020-01-18 LAB — C-REACTIVE PROTEIN: CRP: 11.7 mg/dL — ABNORMAL HIGH (ref <1.0)

## 2020-01-18 LAB — SEDIMENTATION RATE: Sed Rate: 95 mm/hr — ABNORMAL HIGH (ref 0–16)

## 2020-01-18 MED ORDER — AMOXICILLIN-POT CLAVULANATE 500-125 MG PO TABS: 1.0000 | ORAL_TABLET | Freq: Two times a day (BID) | ORAL | 0 refills | Status: AC

## 2020-01-18 MED ORDER — FOSFOMYCIN TROMETHAMINE 3 G PO PACK
3.0000 g | PACK | Freq: Once | ORAL | Status: AC
  Administered 2020-01-18: 3 g via ORAL
  Filled 2020-01-18: qty 3

## 2020-01-18 MED ORDER — ALBUTEROL SULFATE HFA 108 (90 BASE) MCG/ACT IN AERS: 2.0000 | INHALATION_SPRAY | Freq: Four times a day (QID) | RESPIRATORY_TRACT | 1 refills | Status: DC | PRN

## 2020-01-18 MED ORDER — LOPERAMIDE HCL 2 MG PO CAPS: 2.0000 mg | ORAL_CAPSULE | ORAL | 0 refills | Status: DC | PRN

## 2020-01-18 MED ORDER — ENSURE ENLIVE PO LIQD: 237.0000 mL | Freq: Two times a day (BID) | ORAL | 12 refills | Status: DC

## 2020-01-18 MED ORDER — MUCINEX 600 MG PO TB12: 600.0000 mg | ORAL_TABLET | Freq: Two times a day (BID) | ORAL | 0 refills | Status: AC

## 2020-01-18 NOTE — Discharge Summary (Signed)
Physician Discharge Summary  ASHAY Richards [id number] DOB: [birth year] DOA: 01/15/2020  PCP: [name], FNP  Admit date: 01/15/2020  Discharge date: 01/18/2020  Admitted From:Home  Disposition:  Home  Recommendations for Outpatient Follow-up:  1. Follow up with PCP in 1-2 weeks 2. Follow-up with Dr. Delton Coombes as previously scheduled 3. Continue on Augmentin for 4 more days as prescribed to complete course of treatment for pneumonia 4. Fosfomycin given on day of discharge to complete UTI treatment which demonstrated multiple species on urine culture 5. Continue follow-up with oncology as previous 6. Hold home lisinopril for now given some softer blood pressure readings 7. Imodium provided as needed for any diarrhea  Home Health: Yes with PT  Equipment/Devices: None  Discharge Condition: Stable  CODE STATUS: Full  Diet recommendation: Heart Healthy/carb modified  Brief/Interim Summary: Per HPI: Daniel Richards a 74 y.o.malewith medical history significant forB-cell lymphoma, CKD 3, hypertension, depression and diabetes mellitus. Patient presented to the ED with complaints of productive cough,chills, body aches, and fever started last night.Temp of103 was recorded at home. Patient is also on chemotherapy. Patient also reports generalized weakness, causing him to fall yesterday and hit his head when he was walking to the bathroom with his walker last night. Cough is productive of yellowish sputum, present over the past week but particularly worse last night. He denies pain with urination.No difficulty breathing. No loose stools no abdominal pain. Reports compliance with all his medications but does not know the names of what he takes. Reports paintodayin his left ear, but his right ear is fine.  3/12:Patient was admitted with sepsis secondary to UTI as well as presumed pneumonia. He has been started on cefepime with blood and urine cultures  pending. He appears to be doing better this morning with no fevers noted. Blood pressures are still remaining soft. We will continue to monitor and wean oxygen as tolerated.  3/13: Patient continues to have episodes of fevers and some diarrhea this morning.  He states that the diarrhea has been ongoing for several weeks and denies any abdominal pain.  Continue cefepime through today with urine cultures showing multiple organisms.  Blood cultures negative.  3/14: Patient is stable for discharge this morning.  He states he did not sleep well and had some mild chest congestion, but he remains on room air with stable vital signs and no further fever noted.  Blood cultures remain negative.  He will be given a dose of fosfomycin for his UTI to complete treatment and will remain on Augmentin now to complete 7-day course of treatment for his pneumonia-especially in the setting of his immunocompromise.  He will remain on Foley catheter and follow-up with urology in outpatient setting.  Discharge Diagnoses:  Active Problems:   Sepsis ([place])  Principal discharge diagnosis: Sepsis secondary to UTI as well as pneumonia in the setting of immunocompromise with stage IV diffuse large B-cell lymphoma.  Discharge Instructions  Discharge Instructions    Diet - low sodium heart healthy   Complete by: As directed    Increase activity slowly   Complete by: As directed      Allergies as of 01/18/2020      Reactions   Vancomycin Hives   Patient received Benadryl to treat the hives      Medication List    STOP taking these medications   lisinopril 20 MG tablet Commonly known as: ZESTRIL     TAKE these medications   acetaminophen 325 MG tablet Commonly  known as: TYLENOL Take 2 tablets (650 mg total) by mouth every 6 (six) hours as needed for mild pain, fever or headache.   albuterol 108 (90 Base) MCG/ACT inhaler Commonly known as: VENTOLIN HFA Inhale 2 puffs into the lungs every 6 (six) hours as  needed for wheezing or shortness of breath.   allopurinol 300 MG tablet Commonly known as: ZYLOPRIM Take 1 tablet (300 mg total) by mouth daily.   amoxicillin-clavulanate 500-125 MG tablet Commonly known as: Augmentin Take 1 tablet (500 mg total) by mouth 2 (two) times daily for 4 days.   calcitRIOL 0.5 MCG capsule Commonly known as: ROCALTROL Take 1 capsule (0.5 mcg total) by mouth daily.   citalopram 20 MG tablet Commonly known as: CELEXA Take 1 tablet (20 mg total) by mouth at bedtime. What changed: how much to take   CYCLOPHOSPHAMIDE IV Inject into the vein every 21 ( twenty-one) days.   DOXORUBICIN HCL IV Inject into the vein every 21 ( twenty-one) days.   Eliquis 5 MG Tabs tablet Generic drug: apixaban Take 5 mg by mouth 2 (two) times daily.   feeding supplement (ENSURE ENLIVE) Liqd Take 237 mLs by mouth 2 (two) times daily between meals.   folic acid 1 MG tablet Commonly known as: FOLVITE Take 1 tablet (1 mg total) by mouth daily.   gabapentin 300 MG capsule Commonly known as: NEURONTIN Take 2 capsules (600 mg total) by mouth 3 (three) times daily.   GaviLAX 17 GM/SCOOP powder Generic drug: polyethylene glycol powder Take 17 g by mouth daily as needed for mild constipation or moderate constipation.   HYDROcodone-acetaminophen 10-325 MG tablet Commonly known as: NORCO Take 1 tablet by mouth every 6 (six) hours as needed for moderate pain or severe pain.   lidocaine-prilocaine cream Commonly known as: EMLA Apply a small amount to port a cath site and cover with plastic wrap 1 hour prior to chemotherapy appointments   loperamide 2 MG capsule Commonly known as: IMODIUM Take 1 capsule (2 mg total) by mouth as needed for diarrhea or loose stools.   metFORMIN 500 MG tablet Commonly known as: GLUCOPHAGE Take 1 tablet (500 mg total) by mouth daily.   Mucinex 600 MG 12 hr tablet Generic drug: guaiFENesin Take 1 tablet (600 mg total) by mouth 2 (two) times  daily for 10 days.   ondansetron 4 MG tablet Commonly known as: ZOFRAN Take 1 tablet (4 mg total) by mouth every 6 (six) hours as needed for nausea.   pantoprazole 40 MG tablet Commonly known as: PROTONIX Take 1 tablet (40 mg total) by mouth 2 (two) times daily before a meal.   predniSONE 20 MG tablet Commonly known as: DELTASONE Take 3 tablets daily on days 1-5 of chemotherapy. What changed: See the new instructions.   primidone 50 MG tablet Commonly known as: MYSOLINE Take 1.5 tablets (75 mg total) by mouth every 8 (eight) hours. What changed:   how much to take  when to take this   prochlorperazine 10 MG tablet Commonly known as: COMPAZINE Take 1 tablet (10 mg total) by mouth every 6 (six) hours as needed (Nausea or vomiting).   propranolol 40 MG tablet Commonly known as: INDERAL Take 1 tablet (40 mg total) by mouth 2 (two) times daily.   riTUXimab in sodium chloride 0.9 % 250 mL Inject into the vein every 21 ( twenty-one) days.   tamsulosin 0.4 MG Caps capsule Commonly known as: FLOMAX Take 1 capsule (0.4 mg total) by mouth daily  after supper.   temazepam 15 MG capsule Commonly known as: RESTORIL Take 1 capsule (15 mg total) by mouth at bedtime.   topiramate 100 MG tablet Commonly known as: TOPAMAX Take 2 tablets (200 mg total) by mouth at bedtime.   vinCRIStine 2 mg in sodium chloride 0.9 % 50 mL Inject 2 mg into the vein every 21 ( twenty-one) days.      Follow-up Information    Drue Second IV, FNP Follow up in 1 week(s).   Specialty: Family Medicine Contact information: [facility] [address] [place] [address] [phone]          Allergies  Allergen Reactions  . Vancomycin Hives    Patient received Benadryl to treat the hives    Consultations:  None   Procedures/Studies: DG Chest 1 View  Result Date: 01/15/2020 CLINICAL DATA:  Fever and cough EXAM: CHEST  1 VIEW COMPARISON:   08/26/2019 FINDINGS: Cardiac shadow is within normal limits. Left chest wall port is again seen and stable. Postsurgical changes in the cervical spine are noted. The lungs are well aerated bilaterally without focal infiltrate or sizable effusion. No bony abnormality is noted. IMPRESSION: No active disease. Electronically Signed   By: Inez Catalina M.D.   On: 01/15/2020 18:48   CT Head Wo Contrast  Result Date: 01/15/2020 CLINICAL DATA:  Minor head trauma from fall. EXAM: CT HEAD WITHOUT CONTRAST TECHNIQUE: Contiguous axial images were obtained from the base of the skull through the vertex without intravenous contrast. COMPARISON:  August 15, 2019. FINDINGS: Brain: No evidence of acute infarction, hemorrhage, hydrocephalus, extra-axial collection or mass lesion/mass effect. Subacute or chronic -appearing lacunar infarctions in the left insular ribbon and right Peri basal ganglia deep white matter. Vascular: No hyperdense vessel. Intracranial calcified atherosclerosis. Skull: Normal. Sinuses/Orbits: No traumatic air-fluid levels in the paranasal sinuses. Mild bilateral maxillary and ethmoidal mucosal thickening. Blunting of the right internal auditory canal. Other: Debris, probably cerumen in the left external auditory canal. Pneumatized mastoid air cells and middle ears. IMPRESSION: No acute intracranial or skull injury. Mild cerebral atrophy and microvascular white matter ischemic changes with bilateral subacute or chronic -appearing lacunar infarction and intracranial calcified atherosclerosis. Blunting of the right internal auditory canal, which questions a possible acute pharyngitis or right otitis media. Mild paranasal sinus mucoperiosteal disease. Electronically Signed   By: Revonda Humphrey   On: 01/15/2020 19:48     Discharge Exam: Vitals:   01/17/20 2117 01/18/20 0533  BP: 103/60 104/63  Pulse: 69 63  Resp: 18 18  Temp: 97.8 F (36.6 C) 98.5 F (36.9 C)  SpO2: 96% 98%   Vitals:   01/17/20  1001 01/17/20 1409 01/17/20 2117 01/18/20 0533  BP: 126/64 (!) 115/56 103/60 104/63  Pulse: 90 64 69 63  Resp:  17 18 18   Temp:  98.9 F (37.2 C) 97.8 F (36.6 C) 98.5 F (36.9 C)  TempSrc:   Oral   SpO2: 97% 98% 96% 98%  Weight:      Height:        General: Pt is alert, awake, not in acute distress Cardiovascular: RRR, S1/S2 +, no rubs, no gallops Respiratory: CTA bilaterally, no wheezing, no rhonchi Abdominal: Soft, NT, ND, bowel sounds + Extremities: no edema, no cyanosis    The results of significant diagnostics from this hospitalization (including imaging, microbiology, ancillary and laboratory) are listed below for reference.     Microbiology: Recent Results (from the past  240 hour(s))  Urine culture     Status: Abnormal   Collection Time: 01/15/20  5:41 PM   Specimen: In/Out Cath Urine  Result Value Ref Range Status   Specimen Description   Final    IN/OUT CATH URINE Performed at [facility], [address], [place], [place] [address]    Special Requests   Final    NONE Performed at [facility], [address], [place], [place] [address]    Culture MULTIPLE SPECIES PRESENT, SUGGEST RECOLLECTION (A)  Final   Report Status 01/17/2020 FINAL  Final  Blood Culture (routine x 2)     Status: None (Preliminary result)   Collection Time: 01/15/20  6:09 PM   Specimen: BLOOD RIGHT HAND  Result Value Ref Range Status   Specimen Description BLOOD RIGHT HAND  Final   Special Requests   Final    BOTTLES DRAWN AEROBIC AND ANAEROBIC Blood Culture adequate volume   Culture   Final    NO GROWTH 3 DAYS Performed at [facility], [address] [place], [place] [address]    Report Status PENDING  Incomplete  Blood Culture (routine x 2)     Status: None (Preliminary result)   Collection Time: 01/15/20  6:11 PM   Specimen: Left Antecubital; Blood  Result Value Ref Range Status   Specimen Description LEFT ANTECUBITAL  Final   Special Requests   Final    BOTTLES DRAWN AEROBIC  AND ANAEROBIC Blood Culture adequate volume   Culture   Final    NO GROWTH 3 DAYS Performed at [facility], [address], [place], [place] [address]    Report Status PENDING  Incomplete  SARS CORONAVIRUS 2 (TAT 6-24 HRS) Nasopharyngeal Nasopharyngeal Swab     Status: None   Collection Time: 01/15/20  8:28 PM   Specimen: Nasopharyngeal Swab  Result Value Ref Range Status   SARS Coronavirus 2 NEGATIVE NEGATIVE Final    Comment: (NOTE) SARS-CoV-2 target nucleic acids are NOT DETECTED. The SARS-CoV-2 RNA is generally detectable in upper and lower respiratory specimens during the acute phase of infection. Negative results do not preclude SARS-CoV-2 infection, do not rule out co-infections with other pathogens, and should not be used as the sole basis for treatment or other patient management decisions. Negative results must be combined with clinical observations, patient history, and epidemiological information. The expected result is Negative. Fact Sheet for Patients: SugarRoll.be Fact Sheet for Healthcare Providers: https://www.woods-mathews.com/ This test is not yet approved or cleared by the [place] FDA and  has been authorized for detection and/or diagnosis of SARS-CoV-2 by FDA under an Emergency Use Authorization (EUA). This EUA will remain  in effect (meaning this test can be used) for the duration of the COVID-19 declaration under Section 56 4(b)(1) of the Act, 21 U.S.C. section 360bbb-3(b)(1), unless the authorization is terminated or revoked sooner. Performed at [facility], [place] [address], [place], [place] [address]      Labs: BNP (last 3 results) No results for input(s): BNP in the last 8760 hours. Basic Metabolic Panel: Recent Labs  Lab 01/15/20 1809 01/16/20 0437 01/17/20 0601 01/18/20 0641  NA 137 139 138 138  K 3.9 3.7 3.3* 4.0  CL 104 106 104 108  CO2 25 25 24 23   GLUCOSE 139* 97 104* 89  BUN 22 24*  21 20  CREATININE 1.64* 1.60* 1.56* 1.44*  CALCIUM 8.6* 8.4* 8.6* 8.4*  MG  --   --   --  2.1  Liver Function Tests: Recent Labs  Lab 01/15/20 1809  AST 15  ALT 17  ALKPHOS 103  BILITOT 0.6  PROT 6.6  ALBUMIN 3.1*   No results for input(s): LIPASE, AMYLASE in the last 168 hours. No results for input(s): AMMONIA in the last 168 hours. CBC: Recent Labs  Lab 01/15/20 1809 01/16/20 0437 01/17/20 0601 01/18/20 0641  WBC 22.0* 16.7* 9.2 6.3  NEUTROABS 19.2*  --   --   --   HGB 9.5* 8.9* 8.6* 7.8*  HCT 30.6* 29.0* 28.2* 25.2*  MCV 103.4* 103.6* 102.5* 102.0*  PLT 265 231 254 243   Cardiac Enzymes: No results for input(s): CKTOTAL, CKMB, CKMBINDEX, TROPONINI in the last 168 hours. BNP: Invalid input(s): POCBNP CBG: Recent Labs  Lab 01/17/20 1106 01/17/20 1634 01/17/20 1736 01/17/20 2117 01/18/20 0733  GLUCAP 152* 66* 131* 130* 85   D-Dimer No results for input(s): DDIMER in the last 72 hours. Hgb A1c Recent Labs    01/16/20 0437  HGBA1C 6.6*   Lipid Profile No results for input(s): CHOL, HDL, LDLCALC, TRIG, CHOLHDL, LDLDIRECT in the last 72 hours. Thyroid function studies No results for input(s): TSH, T4TOTAL, T3FREE, THYROIDAB in the last 72 hours.  Invalid input(s): FREET3 Anemia work up No results for input(s): VITAMINB12, FOLATE, FERRITIN, TIBC, IRON, RETICCTPCT in the last 72 hours. Urinalysis    Component Value Date/Time   COLORURINE YELLOW 01/15/2020 1741   APPEARANCEUR CLOUDY (A) 01/15/2020 1741   LABSPEC 1.011 01/15/2020 1741   PHURINE 5.0 01/15/2020 1741   GLUCOSEU NEGATIVE 01/15/2020 1741   HGBUR SMALL (A) 01/15/2020 1741   BILIRUBINUR NEGATIVE 01/15/2020 1741   KETONESUR NEGATIVE 01/15/2020 1741   PROTEINUR 100 (A) 01/15/2020 1741   UROBILINOGEN 1.0 11/18/2014 1238   NITRITE POSITIVE (A) 01/15/2020 1741   LEUKOCYTESUR LARGE (A) 01/15/2020 1741   Sepsis Labs Invalid input(s): PROCALCITONIN,  WBC,  LACTICIDVEN Microbiology Recent  Results (from the past 240 hour(s))  Urine culture     Status: Abnormal   Collection Time: 01/15/20  5:41 PM   Specimen: In/Out Cath Urine  Result Value Ref Range Status   Specimen Description   Final    IN/OUT CATH URINE Performed at [facility], [address], [place], [place] [address]    Special Requests   Final    NONE Performed at [facility], [address], [place], [place] [address]    Culture MULTIPLE SPECIES PRESENT, SUGGEST RECOLLECTION (A)  Final   Report Status 01/17/2020 FINAL  Final  Blood Culture (routine x 2)     Status: None (Preliminary result)   Collection Time: 01/15/20  6:09 PM   Specimen: BLOOD RIGHT HAND  Result Value Ref Range Status   Specimen Description BLOOD RIGHT HAND  Final   Special Requests   Final    BOTTLES DRAWN AEROBIC AND ANAEROBIC Blood Culture adequate volume   Culture   Final    NO GROWTH 3 DAYS Performed at [facility], [address], [place], [place] [address]    Report Status PENDING  Incomplete  Blood Culture (routine x 2)     Status: None (Preliminary result)   Collection Time: 01/15/20  6:11 PM   Specimen: Left Antecubital; Blood  Result Value Ref Range Status   Specimen Description LEFT ANTECUBITAL  Final   Special Requests   Final    BOTTLES DRAWN AEROBIC AND ANAEROBIC Blood Culture adequate volume   Culture   Final    NO GROWTH 3 DAYS Performed at  [place] [place],  [place] [address]    Report Status PENDING  Incomplete  SARS CORONAVIRUS 2 (TAT 6-24 HRS) Nasopharyngeal Nasopharyngeal Swab     Status: None   Collection Time: 01/15/20  8:28 PM   Specimen: Nasopharyngeal Swab  Result Value Ref Range Status   SARS Coronavirus 2 NEGATIVE NEGATIVE Final    Comment: (NOTE) SARS-CoV-2 target nucleic acids are NOT DETECTED. The SARS-CoV-2 RNA is generally detectable in upper and lower respiratory specimens during the acute phase of infection. Negative results do not preclude SARS-CoV-2 infection, do not  rule out co-infections with other pathogens, and should not be used as the sole basis for treatment or other patient management decisions. Negative results must be combined with clinical observations, patient history, and epidemiological information. The expected result is Negative. Fact Sheet for Patients: SugarRoll.be Fact Sheet for Healthcare Providers: https://www.woods-mathews.com/ This test is not yet approved or cleared by the [place] FDA and  has been authorized for detection and/or diagnosis of SARS-CoV-2 by FDA under an Emergency Use Authorization (EUA). This EUA will remain  in effect (meaning this test can be used) for the duration of the COVID-19 declaration under Section 56 4(b)(1) of the Act, 21 U.S.C. section 360bbb-3(b)(1), unless the authorization is terminated or revoked sooner. Performed at [facility], [place] [address] [place], [place] [address]      Time coordinating discharge: 35 minutes  SIGNED:   Rodena Goldmann, DO [facility] 01/18/2020, 8:49 AM  If 7PM-7AM, please contact night-coverage [email or web address]

## 2020-01-18 NOTE — TOC Transition Note (Signed)
Transition of Care [facility]) - CM/SW Discharge Note   Patient Details  Name: Daniel Richards MRN: [id number] Date of Birth: 06-Aug-1946  Transition of Care [facility]) CM/SW Contact:  Shade Flood, LCSW Phone Number: 01/18/2020, 11:33 AM   Clinical Narrative:     Pt has orders for dc with [facility] today. Spoke with pt by phone. Pt reports that he lives with his daughter and she helps with his care. Per pt, he has had [facility] in the past and he would like them again. Pt asked TOC to update his daughter. Called daughter and could only leave a Dance movement psychotherapist.  There are no other TOC needs identified for dc.  Expected Discharge Plan: [place] Barriers to Discharge: Barriers Resolved   Patient Goals and CMS Choice Patient states their goals for this hospitalization and ongoing recovery are:: go home CMS Medicare.gov Compare Post Acute Care list provided to:: Patient Choice offered to / list presented to : Patient  Expected Discharge Plan and Services Expected Discharge Plan: [place] In-house Referral: Clinical Social Work   Post Acute Care Choice: [place] arrangements for the past 2 months: [place] Expected Discharge Date: 01/18/20                         HH Arranged: PT [place]: [place] ([place]) Date [place]: 01/18/20   Representative spoke with at [place]: Corene Cornea  Prior Living Arrangements/Services Living arrangements for the past 2 months: [place] Lives with:: Adult Children Patient language and need for interpreter reviewed:: Yes Do you feel safe going back to the place where you live?: Yes      Need for Family Participation in Patient Care: Yes (Comment) Care giver support system in place?: Yes (comment) Current home services: Home PT Criminal Activity/Legal Involvement Pertinent to Current Situation/Hospitalization: No - Comment as needed  Activities of Daily  Living Home Assistive Devices/Equipment: Bedside commode/3-in-1, Eyeglasses, Shower chair with back, Walker (specify type) ADL Screening (condition at time of admission) Patient's cognitive ability adequate to safely complete daily activities?: Yes Is the patient deaf or have difficulty hearing?: No Does the patient have difficulty seeing, even when wearing glasses/contacts?: No Does the patient have difficulty concentrating, remembering, or making decisions?: No Patient able to express need for assistance with ADLs?: Yes Does the patient have difficulty dressing or bathing?: Yes Independently performs ADLs?: Yes (appropriate for developmental age) Does the patient have difficulty walking or climbing stairs?: Yes Weakness of Legs: Both Weakness of Arms/Hands: Both  Permission Sought/Granted Permission sought to share information with : Chartered certified accountant granted to share information with : Yes, Verbal Permission Granted     Permission granted to share info w AGENCY: [facility]        Emotional Assessment Appearance:: Appears stated age Attitude/Demeanor/Rapport: Engaged Affect (typically observed): Pleasant Orientation: : Oriented to Situation, Oriented to  Time, Oriented to Place, Oriented to Self Alcohol / Substance Use: Not Applicable Psych Involvement: No (comment)  Admission diagnosis:  Pyelonephritis [N12] Sepsis ([place]) [A41.9] Sepsis, due to unspecified organism, unspecified whether acute organ dysfunction present [facility]) [A41.9] Patient Active Problem List   Diagnosis Date Noted  . Sepsis ([place]) 01/15/2020  . Hypokalemia   . Acute cystitis without hematuria   . Epiploic appendagitis 09/27/2019  . Acute saddle pulmonary embolism with acute cor pulmonale (HCC)   . Urinary tract infection associated with  indwelling urethral catheter ([place])   . Febrile neutropenia ([place]) 08/26/2019  . PE (pulmonary thromboembolism) ([place]) 08/26/2019  . Neutropenia,  drug-induced ([place]) 08/20/2019  . 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 Port-A-Cath in place 08/13/2019  . Anemia of chronic disease 08/07/2019  . Major depression, recurrent, chronic ([place]) 08/07/2019  . BPH without urinary obstruction 08/07/2019  . Aortic atherosclerosis ([place]) 08/07/2019  . Diabetic peripheral neuropathy ([place]) 08/07/2019  . Hypertension associated with type 2 diabetes mellitus ([place]) 08/07/2019  . 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 Odynophagia   . Acute kidney injury ([place])   . Falls frequently   . Generalized abdominal pain   . Goals of care, counseling/discussion   . Palliative care by specialist   . Folate deficiency   . Generalized weakness 07/25/2019  . Intra-abdominal lymphadenopathy 07/25/2019  . Retroperitoneal lymphadenopathy 07/25/2019  . Malnutrition of moderate degree 07/25/2019  . Tremor   . Hypercalcemia 07/24/2019  . B12 deficiency 11/18/2016  . Weakness generalized 11/16/2016  . Altered mental status, unspecified 11/16/2016  . Fracture, rib 11/16/2016  . Weakness 11/16/2016  . New onset of headaches after age 9 09/01/2015  . Morning headache 09/01/2015  . Daytime somnolence 09/01/2015  . Essential tremor 09/01/2015  . Intractable headache 09/01/2015  . Type 2 diabetes, controlled, with peripheral neuropathy ([place]) 06/09/2011   PCP:  [name], FNP Pharmacy:   [facility], [place] [address] Phone: [phone] Fax: [phone]  [facility] - [place], [place] - [address] [place] [address] Phone: [phone] Fax: [place], [place] [facility] [place] [address] Phone: [phone] Fax: [place], [place] [id number] [place] [id number] [place] [address] Phone: [phone] Fax: [phone]     Social Determinants of Health ([place]) Interventions    Readmission Risk Interventions Readmission Risk Prevention Plan 01/18/2020 09/30/2019 07/30/2019  Post Dischage Appt - - Not Complete  Appt Comments - [facility] MD will follow while pt at short term rehab  Medication Screening - - -  Transportation Screening Complete Complete -  Medication Review Press photographer) Complete Complete -  PCP or Specialist appointment within 3-5 days of discharge - Not Complete -  [place] or Home Care Consult Complete Complete -  SW Recovery Care/Counseling Consult Complete Complete -  Palliative Care Screening Not Applicable Not Complete -  [place] Not Applicable Not Complete -  Some recent data might be hidden    Final next level of care: Home w Home Health Services Barriers to Discharge: Barriers Resolved   Patient Goals and CMS Choice Patient states their goals for this hospitalization and ongoing recovery are:: go home CMS Medicare.gov Compare Post Acute Care list provided to:: Patient Choice offered to / list presented to : Patient  Discharge Placement                       Discharge Plan and Services In-house Referral: Clinical Social Work   Post Acute Care Choice: Home Health                    HH Arranged: PT [facility] Agency: [place] ([facility]) Date [facility] Agency Contacted:  01/18/20   Representative spoke with at [place]: [place] ([place]) Interventions     Readmission Risk Interventions Readmission Risk Prevention Plan 01/18/2020 09/30/2019 07/30/2019  Post Dischage Appt - - Not Complete  Appt Comments - [facility] MD will follow while pt at short term rehab  Medication Screening - - -  Transportation Screening Complete Complete -  Medication Review Press photographer) Complete Complete -  PCP or Specialist appointment  within 3-5 days of discharge - Not Complete -  [place] or Home Care Consult Complete Complete -  SW Recovery Care/Counseling Consult Complete Complete -  Palliative Care Screening Not Applicable Not Complete -  [place] Not Applicable Not Complete -  Some recent data might be hidden

## 2020-01-20 ENCOUNTER — Other Ambulatory Visit: Payer: Self-pay

## 2020-01-20 ENCOUNTER — Ambulatory Visit (INDEPENDENT_AMBULATORY_CARE_PROVIDER_SITE_OTHER): Payer: Medicare Other | Admitting: Urology

## 2020-01-20 VITALS — BP 102/59 | HR 61 | Temp 97.4°F | Ht 71.5 in | Wt 164.0 lb

## 2020-01-20 DIAGNOSIS — R339 Retention of urine, unspecified: Secondary | ICD-10-CM | POA: Diagnosis not present

## 2020-01-20 LAB — CULTURE, BLOOD (ROUTINE X 2)
Culture: NO GROWTH
Culture: NO GROWTH
Special Requests: ADEQUATE
Special Requests: ADEQUATE

## 2020-01-20 NOTE — Progress Notes (Signed)
H&P  Chief Complaint: Urinary Retention   History of Present Illness:   3.16.2021: Here today for follow-up still on indwelling catheter. On 3.11.2021, he was hospitalized for sepsis -- treated for UTI and pneumonia and discharged 3.14.2021. His cath was last changed a few weeks ago. He is tolerating his catheter quite well and is not currently experiencing any discomfort with this.  (below copied from [facility] records):  1.12.2021:This patient presents today having had recurrent urinary tract infections for which he was being treated over the course of a month (Sept 2020), but following a heavy fall while trying to urinate he was put on indwelling catheter. Shortly after, he was diagnosed with lymphoma and has been receiving treatment (he has had multiple chemo treatments and is due one this week). During a hospital visit, he also developed c.diff and between this and his recurrent UTI's he has been on many courses of abx's. 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 Prior to onset of this recent episode of cystitis, he was not having many urinary complaints aside from high urinary freq and weakened stream. His cath was last changed 2 weeks ago. At this change, he had a neg UA -- this was ordered because he had some cognitive impairment which is apparently typical for him when infected. Additionally of note, the days following chemo treatment he reports having cloudy/"slimy" looking urine. He has never been seen by a urologist prior to this and denies ever having had a prostate check. He tolerates his foley cath very well but this is apparently taxing on his family providing care for him.  1.19.2021: He returns today for follow-up.  He has been on tamsulosin since his last visit.  This has not caused significant side effects.  Past Medical History:  Diagnosis Date  . Acid reflux   . Anxiety and depression   . Cancer ([place])    lymphoma  .  Depression   . Diabetes ([place])   . Diabetes mellitus 06/09/2011   pt taking metformin  . DJD of shoulder    right   . Hyperlipidemia   . Hypertension   . Lymphoma ([place])   . Migraines   . Port-A-Cath in place 08/13/2019  . Pulmonary embolism ([place])   . Stomach ulcer   . Tremor   . Tremor   . Vitamin D deficiency     Past Surgical History:  Procedure Laterality Date  . BACK SURGERY    . KNEE SURGERY    . PORTACATH PLACEMENT Left 08/08/2019   Procedure: INSERTION PORT-A-CATH;  Surgeon: Aviva Signs, MD;  Location: [facility];  Service: General;  Laterality: Left;  . SHOULDER SURGERY      Home Medications:  Allergies as of 01/20/2020      Reactions   Vancomycin Hives   Patient received Benadryl to treat the hives      Medication List       Accurate as of January 20, 2020 11:46 AM. If you have any questions, ask your nurse or doctor.        acetaminophen 325 MG tablet Commonly known as: TYLENOL Take 2 tablets (650 mg total) by mouth every 6 (six) hours as needed for mild pain, fever or headache.   albuterol 108 (90 Base) MCG/ACT inhaler Commonly known as: VENTOLIN HFA Inhale 2 puffs into the lungs every 6 (six) hours as needed for wheezing or shortness of breath.   allopurinol 300 MG tablet Commonly known  as: ZYLOPRIM Take 1 tablet (300 mg total) by mouth daily.   amoxicillin-clavulanate 500-125 MG tablet Commonly known as: Augmentin Take 1 tablet (500 mg total) by mouth 2 (two) times daily for 4 days.   calcitRIOL 0.5 MCG capsule Commonly known as: ROCALTROL Take 1 capsule (0.5 mcg total) by mouth daily.   citalopram 20 MG tablet Commonly known as: CELEXA Take 1 tablet (20 mg total) by mouth at bedtime. What changed: how much to take   CYCLOPHOSPHAMIDE IV Inject into the vein every 21 ( twenty-one) days.   DOXORUBICIN HCL IV Inject into the vein every 21 ( twenty-one) days.   Eliquis 5 MG Tabs tablet Generic drug: apixaban Take 5 mg by mouth 2 (two) times  daily.   feeding supplement (ENSURE ENLIVE) Liqd Take 237 mLs by mouth 2 (two) times daily between meals.   folic acid 1 MG tablet Commonly known as: FOLVITE Take 1 tablet (1 mg total) by mouth daily.   gabapentin 300 MG capsule Commonly known as: NEURONTIN Take 2 capsules (600 mg total) by mouth 3 (three) times daily.   GaviLAX 17 GM/SCOOP powder Generic drug: polyethylene glycol powder Take 17 g by mouth daily as needed for mild constipation or moderate constipation.   HYDROcodone-acetaminophen 10-325 MG tablet Commonly known as: NORCO Take 1 tablet by mouth every 6 (six) hours as needed for moderate pain or severe pain.   lidocaine-prilocaine cream Commonly known as: EMLA Apply a small amount to port a cath site and cover with plastic wrap 1 hour prior to chemotherapy appointments   loperamide 2 MG capsule Commonly known as: IMODIUM Take 1 capsule (2 mg total) by mouth as needed for diarrhea or loose stools.   metFORMIN 500 MG tablet Commonly known as: GLUCOPHAGE Take 1 tablet (500 mg total) by mouth daily.   Mucinex 600 MG 12 hr tablet Generic drug: guaiFENesin Take 1 tablet (600 mg total) by mouth 2 (two) times daily for 10 days.   ondansetron 4 MG tablet Commonly known as: ZOFRAN Take 1 tablet (4 mg total) by mouth every 6 (six) hours as needed for nausea.   pantoprazole 40 MG tablet Commonly known as: PROTONIX Take 1 tablet (40 mg total) by mouth 2 (two) times daily before a meal.   predniSONE 20 MG tablet Commonly known as: DELTASONE Take 3 tablets daily on days 1-5 of chemotherapy. What changed: See the new instructions.   primidone 50 MG tablet Commonly known as: MYSOLINE Take 1.5 tablets (75 mg total) by mouth every 8 (eight) hours. What changed:   how much to take  when to take this   prochlorperazine 10 MG tablet Commonly known as: COMPAZINE Take 1 tablet (10 mg total) by mouth every 6 (six) hours as needed (Nausea or vomiting).     propranolol 40 MG tablet Commonly known as: INDERAL Take 1 tablet (40 mg total) by mouth 2 (two) times daily.   riTUXimab in sodium chloride 0.9 % 250 mL Inject into the vein every 21 ( twenty-one) days.   tamsulosin 0.4 MG Caps capsule Commonly known as: FLOMAX Take 1 capsule (0.4 mg total) by mouth daily after supper.   temazepam 15 MG capsule Commonly known as: RESTORIL Take 1 capsule (15 mg total) by mouth at bedtime.   topiramate 100 MG tablet Commonly known as: TOPAMAX Take 2 tablets (200 mg total) by mouth at bedtime.   vinCRIStine 2 mg in sodium chloride 0.9 % 50 mL Inject 2 mg into the vein every  21 ( twenty-one) days.       Allergies:  Allergies  Allergen Reactions  . Vancomycin Hives    Patient received Benadryl to treat the hives    Family History  Problem Relation Age of Onset  . Cancer Sister   . Cancer Sister   . Cancer Brother   . Heart attack Father   . Cancer Brother   . Heart attack Brother   . Healthy Son   . Healthy Daughter   . Migraines Neg Hx     Social History:  reports that he quit smoking about 30 years ago. His smoking use included cigarettes. He has a 40.00 pack-year smoking history. He has never used smokeless tobacco. He reports that he does not drink alcohol or use drugs.  ROS: A complete review of systems was performed.  All systems are negative except for pertinent findings as noted.  Physical Exam:  Vital signs in last 24 hours: BP (!) 102/59   Pulse 61   Temp (!) 97.4 F (36.3 C)   Ht 5' 11.5" (1.816 m)   Wt 164 lb (74.4 kg)   BMI 22.55 kg/m  Constitutional:  Alert and oriented, No acute distress, in wheelchair. Cardiovascular: Regular rate  Respiratory: Normal respiratory effort GI: Abdomen is soft, nontender, nondistended, no abdominal masses. No CVAT.  Genitourinary:Indwelling cath present.  Lymphatic: No lymphadenopathy Neurologic: Grossly intact, no focal deficits Psychiatric: Normal mood and  affect  Laboratory Data:  Recent Labs    01/18/20 0641  WBC 6.3  HGB 7.8*  HCT 25.2*  PLT 243    Recent Labs    01/18/20 0641  NA 138  K 4.0  CL 108  GLUCOSE 89  BUN 20  CALCIUM 8.4*  CREATININE 1.44*     No results found for this or any previous visit (from the past 24 hour(s)). Recent Results (from the past 240 hour(s))  Urine culture     Status: Abnormal   Collection Time: 01/15/20  5:41 PM   Specimen: In/Out Cath Urine  Result Value Ref Range Status   Specimen Description   Final    IN/OUT CATH URINE Performed at [facility], [address] [place], [place] [address]    Special Requests   Final    NONE Performed at [facility], [address] [place], [place] [address]    Culture MULTIPLE SPECIES PRESENT, SUGGEST RECOLLECTION (A)  Final   Report Status 01/17/2020 FINAL  Final  Blood Culture (routine x 2)     Status: None   Collection Time: 01/15/20  6:09 PM   Specimen: BLOOD RIGHT HAND  Result Value Ref Range Status   Specimen Description BLOOD RIGHT HAND  Final   Special Requests   Final    BOTTLES DRAWN AEROBIC AND ANAEROBIC Blood Culture adequate volume   Culture   Final    NO GROWTH 5 DAYS Performed at [facility], [address], [place], [place] [address]    Report Status 01/20/2020 FINAL  Final  Blood Culture (routine x 2)     Status: None   Collection Time: 01/15/20  6:11 PM   Specimen: Left Antecubital; Blood  Result Value Ref Range Status   Specimen Description LEFT ANTECUBITAL  Final   Special Requests   Final    BOTTLES DRAWN AEROBIC AND ANAEROBIC Blood Culture adequate volume   Culture   Final    NO GROWTH 5 DAYS Performed at [facility], [address] [place], [place] [address]  Report Status 01/20/2020 FINAL  Final  SARS CORONAVIRUS 2 (TAT 6-24 HRS) Nasopharyngeal Nasopharyngeal Swab     Status: None   Collection Time: 01/15/20  8:28 PM   Specimen: Nasopharyngeal Swab  Result Value Ref Range Status   SARS Coronavirus 2  NEGATIVE NEGATIVE Final    Comment: (NOTE) SARS-CoV-2 target nucleic acids are NOT DETECTED. The SARS-CoV-2 RNA is generally detectable in upper and lower respiratory specimens during the acute phase of infection. Negative results do not preclude SARS-CoV-2 infection, do not rule out co-infections with other pathogens, and should not be used as the sole basis for treatment or other patient management decisions. Negative results must be combined with clinical observations, patient history, and epidemiological information. The expected result is Negative. Fact Sheet for Patients: SugarRoll.be Fact Sheet for Healthcare Providers: https://www.woods-mathews.com/ This test is not yet approved or cleared by the [place] FDA and  has been authorized for detection and/or diagnosis of SARS-CoV-2 by FDA under an Emergency Use Authorization (EUA). This EUA will remain  in effect (meaning this test can be used) for the duration of the COVID-19 declaration under Section 56 4(b)(1) of the Act, 21 U.S.C. section 360bbb-3(b)(1), unless the authorization is terminated or revoked sooner. Performed at [facility], [place] [address] [place], [place] [address]     Renal Function: Recent Labs    01/15/20 1809 01/16/20 0437 01/17/20 0601 01/18/20 0641  CREATININE 1.64* 1.60* 1.56* 1.44*   Estimated Creatinine Clearance: 46.6 mL/min (A) (by C-G formula based on SCr of 1.44 mg/dL (H)).  Radiologic Imaging: No results found.  Impression/Assessment:  He is tolerating his cath well. It seems like he was not truly infected during his hospitalization (did not grow any specific bacteria). This would be more consistent with benign colonization which is typical for a man his age on a long-term indwelling cath. Seeing as he is doing well with cath and the fat that he has quite low mobility, I think it would be appropriate to leave him on catheter.   Plan:   1. I recommended that he remains on indwelling cath indefinitely given his low mobility and the fact that he is function very well with it.   2. Cath changed today.  3. Return for monthly cath changes.

## 2020-01-20 NOTE — Progress Notes (Signed)
Urological Symptom Review  Patient is experiencing the following symptoms: none   Review of Systems  Gastrointestinal (upper)  : Negative for upper GI symptoms  Gastrointestinal (lower) : Negative for lower GI symptoms  Constitutional : Negative for symptoms  Skin: Negative for skin symptoms  Eyes: Negative for eye symptoms  Ear/Nose/Throat : Negative for Ear/Nose/Throat symptoms  Hematologic/Lymphatic: Negative for Hematologic/Lymphatic symptoms  Cardiovascular : Negative for cardiovascular symptoms  Respiratory : Negative for respiratory symptoms  Endocrine: Negative for endocrine symptoms  Musculoskeletal: Negative for musculoskeletal symptoms  Neurological: Negative for neurological symptoms  Psychologic: Negative for psychiatric symptoms    Cath Change/ Replacement  Patient is present today for a catheter change due to urinary retention.  10 ml of water was removed from the balloon, a 16FR foley cath was removed with out difficulty.  Patient was cleaned and prepped in a sterile fashion with betadine. A 16 FR foley cath was replaced into the bladder no complications were noted Urine return was noted 5 ml and urine was yellow in color. The balloon was filled with 12ml of sterile water. A bedside bag was attached for drainage.  A night bag was also given to the patient and patient was given instruction on how to change from one bag to another. Patient was given proper instruction on catheter care.    Performed by: H.Kira Hartl LPN  Follow up: 1 month NV cath change

## 2020-01-21 ENCOUNTER — Inpatient Hospital Stay (HOSPITAL_COMMUNITY): Payer: Medicare Other | Attending: Hematology

## 2020-01-21 ENCOUNTER — Inpatient Hospital Stay (HOSPITAL_COMMUNITY): Payer: Medicare Other

## 2020-01-21 ENCOUNTER — Inpatient Hospital Stay (HOSPITAL_BASED_OUTPATIENT_CLINIC_OR_DEPARTMENT_OTHER): Payer: Medicare Other | Admitting: Hematology

## 2020-01-21 ENCOUNTER — Other Ambulatory Visit: Payer: Self-pay

## 2020-01-21 ENCOUNTER — Encounter (HOSPITAL_COMMUNITY): Payer: Self-pay | Admitting: Hematology

## 2020-01-21 VITALS — BP 112/56 | HR 62 | Temp 97.5°F | Resp 18

## 2020-01-21 DIAGNOSIS — E1122 Type 2 diabetes mellitus with diabetic chronic kidney disease: Secondary | ICD-10-CM | POA: Diagnosis not present

## 2020-01-21 DIAGNOSIS — F418 Other specified anxiety disorders: Secondary | ICD-10-CM | POA: Diagnosis not present

## 2020-01-21 DIAGNOSIS — C8513 Unspecified B-cell lymphoma, intra-abdominal lymph nodes: Secondary | ICD-10-CM | POA: Insufficient documentation

## 2020-01-21 DIAGNOSIS — Z7984 Long term (current) use of oral hypoglycemic drugs: Secondary | ICD-10-CM | POA: Diagnosis not present

## 2020-01-21 DIAGNOSIS — Z87891 Personal history of nicotine dependence: Secondary | ICD-10-CM | POA: Diagnosis not present

## 2020-01-21 DIAGNOSIS — Z7901 Long term (current) use of anticoagulants: Secondary | ICD-10-CM | POA: Diagnosis not present

## 2020-01-21 DIAGNOSIS — K219 Gastro-esophageal reflux disease without esophagitis: Secondary | ICD-10-CM | POA: Diagnosis not present

## 2020-01-21 DIAGNOSIS — Z86711 Personal history of pulmonary embolism: Secondary | ICD-10-CM | POA: Insufficient documentation

## 2020-01-21 DIAGNOSIS — E119 Type 2 diabetes mellitus without complications: Secondary | ICD-10-CM | POA: Diagnosis not present

## 2020-01-21 DIAGNOSIS — E785 Hyperlipidemia, unspecified: Secondary | ICD-10-CM | POA: Insufficient documentation

## 2020-01-21 DIAGNOSIS — Z8711 Personal history of peptic ulcer disease: Secondary | ICD-10-CM | POA: Diagnosis not present

## 2020-01-21 DIAGNOSIS — I129 Hypertensive chronic kidney disease with stage 1 through stage 4 chronic kidney disease, or unspecified chronic kidney disease: Secondary | ICD-10-CM | POA: Insufficient documentation

## 2020-01-21 DIAGNOSIS — N189 Chronic kidney disease, unspecified: Secondary | ICD-10-CM | POA: Insufficient documentation

## 2020-01-21 DIAGNOSIS — Z79899 Other long term (current) drug therapy: Secondary | ICD-10-CM | POA: Insufficient documentation

## 2020-01-21 DIAGNOSIS — E559 Vitamin D deficiency, unspecified: Secondary | ICD-10-CM | POA: Insufficient documentation

## 2020-01-21 DIAGNOSIS — N390 Urinary tract infection, site not specified: Secondary | ICD-10-CM | POA: Insufficient documentation

## 2020-01-21 DIAGNOSIS — Z86718 Personal history of other venous thrombosis and embolism: Secondary | ICD-10-CM | POA: Insufficient documentation

## 2020-01-21 DIAGNOSIS — Z95828 Presence of other vascular implants and grafts: Secondary | ICD-10-CM

## 2020-01-21 DIAGNOSIS — Z5111 Encounter for antineoplastic chemotherapy: Secondary | ICD-10-CM | POA: Diagnosis not present

## 2020-01-21 LAB — COMPREHENSIVE METABOLIC PANEL
ALT: 12 U/L (ref 0–44)
AST: 16 U/L (ref 15–41)
Albumin: 3.3 g/dL — ABNORMAL LOW (ref 3.5–5.0)
Alkaline Phosphatase: 81 U/L (ref 38–126)
Anion gap: 10 (ref 5–15)
BUN: 20 mg/dL (ref 8–23)
CO2: 24 mmol/L (ref 22–32)
Calcium: 9.3 mg/dL (ref 8.9–10.3)
Chloride: 106 mmol/L (ref 98–111)
Creatinine, Ser: 1.31 mg/dL — ABNORMAL HIGH (ref 0.61–1.24)
GFR calc Af Amer: >60 mL/min (ref >60)
GFR calc non Af Amer: 53 mL/min — ABNORMAL LOW (ref >60)
Glucose, Bld: 147 mg/dL — ABNORMAL HIGH (ref 70–99)
Potassium: 4.3 mmol/L (ref 3.5–5.1)
Sodium: 140 mmol/L (ref 135–145)
Total Bilirubin: 0.2 mg/dL — ABNORMAL LOW (ref 0.3–1.2)
Total Protein: 7 g/dL (ref 6.5–8.1)

## 2020-01-21 LAB — CBC WITH DIFFERENTIAL/PLATELET
Abs Immature Granulocytes: 0.12 10*3/uL — ABNORMAL HIGH (ref 0.00–0.07)
Basophils Absolute: 0.1 10*3/uL (ref 0.0–0.1)
Basophils Relative: 1 %
Eosinophils Absolute: 0.1 10*3/uL (ref 0.0–0.5)
Eosinophils Relative: 1 %
HCT: 30.8 % — ABNORMAL LOW (ref 39.0–52.0)
Hemoglobin: 9.6 g/dL — ABNORMAL LOW (ref 13.0–17.0)
Immature Granulocytes: 1 %
Lymphocytes Relative: 13 %
Lymphs Abs: 1.6 10*3/uL (ref 0.7–4.0)
MCH: 32 pg (ref 26.0–34.0)
MCHC: 31.2 g/dL (ref 30.0–36.0)
MCV: 102.7 fL — ABNORMAL HIGH (ref 80.0–100.0)
Monocytes Absolute: 0.8 10*3/uL (ref 0.1–1.0)
Monocytes Relative: 6 %
Neutro Abs: 10 10*3/uL — ABNORMAL HIGH (ref 1.7–7.7)
Neutrophils Relative %: 78 %
Platelets: 376 10*3/uL (ref 150–400)
RBC: 3 MIL/uL — ABNORMAL LOW (ref 4.22–5.81)
RDW: 14.6 % (ref 11.5–15.5)
WBC: 12.6 10*3/uL — ABNORMAL HIGH (ref 4.0–10.5)
nRBC: 0 % (ref 0.0–0.2)

## 2020-01-21 LAB — MAGNESIUM: Magnesium: 2.1 mg/dL (ref 1.7–2.4)

## 2020-01-21 LAB — LACTATE DEHYDROGENASE: LDH: 105 U/L (ref 98–192)

## 2020-01-21 MED ORDER — DOXORUBICIN HCL CHEMO IV INJECTION 2 MG/ML
25.0000 mg/m2 | Freq: Once | INTRAVENOUS | Status: AC
  Administered 2020-01-21: 52 mg via INTRAVENOUS
  Filled 2020-01-21: qty 26

## 2020-01-21 MED ORDER — SODIUM CHLORIDE 0.9 % IV SOLN
Freq: Once | INTRAVENOUS | Status: AC
  Filled 2020-01-21: qty 5

## 2020-01-21 MED ORDER — SODIUM CHLORIDE 0.9 % IV SOLN
375.0000 mg/m2 | Freq: Once | INTRAVENOUS | Status: AC
  Administered 2020-01-21: 800 mg via INTRAVENOUS
  Filled 2020-01-21: qty 50

## 2020-01-21 MED ORDER — SODIUM CHLORIDE 0.9% FLUSH
10.0000 mL | INTRAVENOUS | Status: DC | PRN
  Administered 2020-01-21: 10 mL

## 2020-01-21 MED ORDER — ACETAMINOPHEN 325 MG PO TABS
ORAL_TABLET | ORAL | Status: AC
  Filled 2020-01-21: qty 2

## 2020-01-21 MED ORDER — VINCRISTINE SULFATE CHEMO INJECTION 1 MG/ML
1.0000 mg | Freq: Once | INTRAVENOUS | Status: AC
  Administered 2020-01-21: 1 mg via INTRAVENOUS
  Filled 2020-01-21: qty 1

## 2020-01-21 MED ORDER — PALONOSETRON HCL INJECTION 0.25 MG/5ML
INTRAVENOUS | Status: AC
  Filled 2020-01-21: qty 5

## 2020-01-21 MED ORDER — PALONOSETRON HCL INJECTION 0.25 MG/5ML
0.2500 mg | Freq: Once | INTRAVENOUS | Status: AC
  Administered 2020-01-21: 0.25 mg via INTRAVENOUS

## 2020-01-21 MED ORDER — SODIUM CHLORIDE 0.9 % IV SOLN: Freq: Once | INTRAVENOUS | Status: AC

## 2020-01-21 MED ORDER — ACETAMINOPHEN 325 MG PO TABS
650.0000 mg | ORAL_TABLET | Freq: Once | ORAL | Status: AC
  Administered 2020-01-21: 650 mg via ORAL

## 2020-01-21 MED ORDER — HEPARIN SOD (PORK) LOCK FLUSH 100 UNIT/ML IV SOLN
500.0000 [IU] | Freq: Once | INTRAVENOUS | Status: AC | PRN
  Administered 2020-01-21: 500 [IU]

## 2020-01-21 MED ORDER — DIPHENHYDRAMINE HCL 50 MG/ML IJ SOLN
50.0000 mg | Freq: Once | INTRAMUSCULAR | Status: AC
  Administered 2020-01-21: 50 mg via INTRAVENOUS

## 2020-01-21 MED ORDER — FAMOTIDINE IN NACL 20-0.9 MG/50ML-% IV SOLN
INTRAVENOUS | Status: AC
  Filled 2020-01-21: qty 50

## 2020-01-21 MED ORDER — DIPHENHYDRAMINE HCL 50 MG/ML IJ SOLN
INTRAMUSCULAR | Status: AC
  Filled 2020-01-21: qty 1

## 2020-01-21 MED ORDER — FAMOTIDINE IN NACL 20-0.9 MG/50ML-% IV SOLN
20.0000 mg | Freq: Once | INTRAVENOUS | Status: AC
  Administered 2020-01-21: 20 mg via INTRAVENOUS

## 2020-01-21 MED ORDER — SODIUM CHLORIDE 0.9 % IV SOLN
490.0000 mg/m2 | Freq: Once | INTRAVENOUS | Status: AC
  Administered 2020-01-21: 1000 mg via INTRAVENOUS
  Filled 2020-01-21: qty 50

## 2020-01-21 NOTE — Patient Instructions (Addendum)
[facility] Discharge Instructions  You were seen today by Dr. Katragadda. He went over your recent lab results. He will see you back in 3 weeks for labs and follow up.   Thank you for choosing  Cancer Center at [facility] to provide your oncology and hematology care.  To afford each patient quality time with our provider, please arrive at least 15 minutes before your scheduled appointment time.   If you have a lab appointment with the Cancer Center please come in thru the  Main Entrance and check in at the main information desk  You need to re-schedule your appointment should you arrive 10 or more minutes late.  We strive to give you quality time with our providers, and arriving late affects you and other patients whose appointments are after yours.  Also, if you no show three or more times for appointments you may be dismissed from the clinic at the providers discretion.     Again, thank you for choosing [facility].  Our hope is that these requests will decrease the amount of time that you wait before being seen by our physicians.       _____________________________________________________________  Should you have questions after your visit to [facility], please contact our office at [phone] between the hours of 8:00 a.m. and 4:30 p.m.  Voicemails left after 4:00 p.m. will not be returned until the following business day.  For prescription refill requests, have your pharmacy contact our office and allow 72 hours.    Cancer Center Support Programs:   > Cancer Support Group  2nd Tuesday of the month 1pm-2pm, Journey Room    

## 2020-01-21 NOTE — Patient Instructions (Signed)
[facility]  Discharge Instructions:   _______________________________________________________________  Thank you for choosing [facility] at [facility] to provide your oncology and hematology care.  To afford each patient quality time with our providers, please arrive at least 15 minutes before your scheduled appointment.  You need to re-schedule your appointment if you arrive 10 or more minutes late.  We strive to give you quality time with our providers, and arriving late affects you and other patients whose appointments are after yours.  Also, if you no show three or more times for appointments you may be dismissed from the clinic.  Again, thank you for choosing [facility] at [facility]. Our hope is that these requests will allow you access to exceptional care and in a timely manner. _______________________________________________________________  If you have questions after your visit, please contact our office at [phone] between the hours of 8:30 a.m. and 5:00 p.m. Voicemails left after 4:30 p.m. will not be returned until the following business day. _______________________________________________________________  For prescription refill requests, have your pharmacy contact our office. _______________________________________________________________  Recommendations made by the consultant and any test results will be sent to your referring physician. _______________________________________________________________ 

## 2020-01-21 NOTE — Progress Notes (Signed)
[place], [place] [address]   CLINIC:  Medical Oncology/Hematology  PCP:  [name], [facility] [address] [place] [address] [phone]   REASON FOR VISIT:  Follow-up for lymphoma.  CURRENT THERAPY: R mini CHOP.   INTERVAL HISTORY:  Daniel Richards 74 y.o. male seen for follow-up and toxicity assessment prior to cycle 6 of chemotherapy.  Appetite and energy levels are 75%.  He was hospitalized from 01/15/2020 through 01/18/2020 with UTI.  He is currently on antibiotic.  He is continuing to ambulate with the help of walker but needs help of another person.  Denies any tingling or numbness in the extremities.  No fevers or chills reported at home.  No pains reported.   REVIEW OF SYSTEMS:  Review of Systems  All other systems reviewed and are negative.    PAST MEDICAL/SURGICAL HISTORY:  Past Medical History:  Diagnosis Date  . Acid reflux   . Anxiety and depression   . Cancer ([place])    lymphoma  . Depression   . Diabetes ([place])   . Diabetes mellitus 06/09/2011   pt taking metformin  . DJD of shoulder    right   . Hyperlipidemia   . Hypertension   . Lymphoma ([place])   . Migraines   . Port-A-Cath in place 08/13/2019  . Pulmonary embolism ([place])   . Stomach ulcer   . Tremor   . Tremor   . Vitamin D deficiency    Past Surgical History:  Procedure Laterality Date  . BACK SURGERY    . KNEE SURGERY    . PORTACATH PLACEMENT Left 08/08/2019   Procedure: INSERTION PORT-A-CATH;  Surgeon: Aviva Signs, MD;  Location: [facility];  Service: General;  Laterality: Left;  . SHOULDER SURGERY       SOCIAL HISTORY:  Social History   Socioeconomic History  . Marital status: Divorced    Spouse name: Not on file  . Number of children: 2  . Years of education: 12+  . Highest education level: Not on file  Occupational History  . Not on file  Tobacco Use  . Smoking status: Former Smoker      Packs/day: 2.00    Years: 20.00    Pack years: 40.00    Types: Cigarettes    Quit date: 11/17/1989    Years since quitting: 30.2  . Smokeless tobacco: Never Used  Substance and Sexual Activity  . Alcohol use: No  . Drug use: No  . Sexual activity: Not Currently  Other Topics Concern  . Not on file  Social History Narrative   Lives at home with son.   Caffeine use: Drinks 1 cup coffee (3 cups per week-decaf)   Social Determinants of Health   Financial Resource Strain: Medium Risk  . Difficulty of Paying Living Expenses: Somewhat hard  Food Insecurity: No Food Insecurity  . Worried About Charity fundraiser in the Last Year: Never true  . Ran Out of Food in the Last Year: Never true  Transportation Needs: No Transportation Needs  . Lack of Transportation (Medical): No  . Lack of Transportation (Non-Medical): No  Physical Activity: Inactive  . Days of Exercise per Week: 0 days  . Minutes of Exercise per Session: 0 min  Stress: No Stress Concern Present  . Feeling of Stress : Not at all  Social Connections: Moderately Isolated  . Frequency of Communication with Friends  and Family: Never  . Frequency of Social Gatherings with Friends and Family: Twice a week  . Attends Religious Services: More than 4 times per year  . Active Member of Clubs or Organizations: No  . Attends Archivist Meetings: Never  . Marital Status: Divorced  Human resources officer Violence: Not At Risk  . Fear of Current or Ex-Partner: No  . Emotionally Abused: No  . Physically Abused: No  . Sexually Abused: No    FAMILY HISTORY:  Family History  Problem Relation Age of Onset  . Cancer Sister   . Cancer Sister   . Cancer Brother   . Heart attack Father   . Cancer Brother   . Heart attack Brother   . Healthy Son   . Healthy Daughter   . Migraines Neg Hx     CURRENT MEDICATIONS:  Outpatient Encounter Medications as of 01/21/2020  Medication Sig  . allopurinol (ZYLOPRIM) 300 MG tablet  Take 1 tablet (300 mg total) by mouth daily.  . [EXPIRED] amoxicillin-clavulanate (AUGMENTIN) 500-125 MG tablet Take 1 tablet (500 mg total) by mouth 2 (two) times daily for 4 days.  . calcitRIOL (ROCALTROL) 0.5 MCG capsule Take 1 capsule (0.5 mcg total) by mouth daily.  . citalopram (CELEXA) 20 MG tablet Take 1 tablet (20 mg total) by mouth at bedtime. (Patient taking differently: Take 40 mg by mouth at bedtime. )  . CYCLOPHOSPHAMIDE IV Inject into the vein every 21 ( twenty-one) days.  Marland Kitchen DOXORUBICIN HCL IV Inject into the vein every 21 ( twenty-one) days.  Marland Kitchen ELIQUIS 5 MG TABS tablet Take 5 mg by mouth 2 (two) times daily.  . feeding supplement, ENSURE ENLIVE, (ENSURE ENLIVE) LIQD Take 237 mLs by mouth 2 (two) times daily between meals.  . folic acid (FOLVITE) 1 MG tablet Take 1 tablet (1 mg total) by mouth daily.  Marland Kitchen gabapentin (NEURONTIN) 300 MG capsule Take 2 capsules (600 mg total) by mouth 3 (three) times daily.  Marland Kitchen guaiFENesin (MUCINEX) 600 MG 12 hr tablet Take 1 tablet (600 mg total) by mouth 2 (two) times daily for 10 days.  Marland Kitchen lidocaine-prilocaine (EMLA) cream Apply a small amount to port a cath site and cover with plastic wrap 1 hour prior to chemotherapy appointments  . metFORMIN (GLUCOPHAGE) 500 MG tablet Take 1 tablet (500 mg total) by mouth daily.  . pantoprazole (PROTONIX) 40 MG tablet Take 1 tablet (40 mg total) by mouth 2 (two) times daily before a meal.  . predniSONE (DELTASONE) 20 MG tablet Take 3 tablets daily on days 1-5 of chemotherapy. (Patient taking differently: Take 60 mg by mouth See admin instructions. Take 60mg  (3 tablets total) on days 1 through 5 of chemotherapy)  . primidone (MYSOLINE) 50 MG tablet Take 1.5 tablets (75 mg total) by mouth every 8 (eight) hours. (Patient taking differently: Take 100 mg by mouth 2 (two) times daily. )  . propranolol (INDERAL) 40 MG tablet Take 1 tablet (40 mg total) by mouth 2 (two) times daily.  . riTUXimab in sodium chloride 0.9 % 250 mL  Inject into the vein every 21 ( twenty-one) days.  . tamsulosin (FLOMAX) 0.4 MG CAPS capsule Take 1 capsule (0.4 mg total) by mouth daily after supper.  . temazepam (RESTORIL) 15 MG capsule Take 1 capsule (15 mg total) by mouth at bedtime.  . topiramate (TOPAMAX) 100 MG tablet Take 2 tablets (200 mg total) by mouth at bedtime.  . vinCRIStine 2 mg in sodium chloride 0.9 % 50  mL Inject 2 mg into the vein every 21 ( twenty-one) days.  Marland Kitchen acetaminophen (TYLENOL) 325 MG tablet Take 2 tablets (650 mg total) by mouth every 6 (six) hours as needed for mild pain, fever or headache. (Patient not taking: Reported on 01/21/2020)  . albuterol (VENTOLIN HFA) 108 (90 Base) MCG/ACT inhaler Inhale 2 puffs into the lungs every 6 (six) hours as needed for wheezing or shortness of breath. (Patient not taking: Reported on 01/21/2020)  . GAVILAX 17 GM/SCOOP powder Take 17 g by mouth daily as needed for mild constipation or moderate constipation.   Marland Kitchen HYDROcodone-acetaminophen (NORCO) 10-325 MG tablet Take 1 tablet by mouth every 6 (six) hours as needed for moderate pain or severe pain.   Marland Kitchen loperamide (IMODIUM) 2 MG capsule Take 1 capsule (2 mg total) by mouth as needed for diarrhea or loose stools. (Patient not taking: Reported on 01/21/2020)  . ondansetron (ZOFRAN) 4 MG tablet Take 1 tablet (4 mg total) by mouth every 6 (six) hours as needed for nausea. (Patient not taking: Reported on 01/21/2020)  . prochlorperazine (COMPAZINE) 10 MG tablet Take 1 tablet (10 mg total) by mouth every 6 (six) hours as needed (Nausea or vomiting). (Patient not taking: Reported on 01/21/2020)   No facility-administered encounter medications on file as of 01/21/2020.    ALLERGIES:  Allergies  Allergen Reactions  . Vancomycin Hives    Patient received Benadryl to treat the hives     PHYSICAL EXAM:  ECOG Performance status: 2  Vitals:   01/21/20 0803  BP: (!) 110/58  Pulse: 61  Resp: 18  Temp: (!) 97.5 F (36.4 C)  SpO2: 97%    Filed Weights   01/21/20 0803  Weight: 160 lb 8 oz (72.8 kg)    Physical Exam Vitals reviewed.  Constitutional:      Appearance: Normal appearance.  Cardiovascular:     Rate and Rhythm: Normal rate and regular rhythm.     Heart sounds: Normal heart sounds.  Pulmonary:     Effort: Pulmonary effort is normal.     Breath sounds: Normal breath sounds.  Abdominal:     General: There is no distension.     Palpations: Abdomen is soft. There is no mass.  Musculoskeletal:        General: No swelling.  Skin:    General: Skin is warm.  Neurological:     General: No focal deficit present.     Mental Status: He is alert and oriented to person, place, and time.  Psychiatric:        Mood and Affect: Mood normal.        Behavior: Behavior normal.   He has an indwelling Foley catheter.   LABORATORY DATA:  I have reviewed the labs as listed.  CBC    Component Value Date/Time   WBC 12.6 (H) 01/21/2020 0755   RBC 3.00 (L) 01/21/2020 0755   HGB 9.6 (L) 01/21/2020 0755   HCT 30.8 (L) 01/21/2020 0755   PLT 376 01/21/2020 0755   MCV 102.7 (H) 01/21/2020 0755   MCH 32.0 01/21/2020 0755   MCHC 31.2 01/21/2020 0755   RDW 14.6 01/21/2020 0755   LYMPHSABS 1.6 01/21/2020 0755   MONOABS 0.8 01/21/2020 0755   EOSABS 0.1 01/21/2020 0755   BASOSABS 0.1 01/21/2020 0755   CMP Latest Ref Rng & Units 01/21/2020 01/18/2020 01/17/2020  Glucose 70 - 99 mg/dL 147(H) 89 104(H)  BUN 8 - 23 mg/dL 20 20 21   Creatinine 0.61 - 1.24  mg/dL 1.31(H) 1.44(H) 1.56(H)  Sodium 135 - 145 mmol/L 140 138 138  Potassium 3.5 - 5.1 mmol/L 4.3 4.0 3.3(L)  Chloride 98 - 111 mmol/L 106 108 104  CO2 22 - 32 mmol/L 24 23 24   Calcium 8.9 - 10.3 mg/dL 9.3 8.4(L) 8.6(L)  Total Protein 6.5 - 8.1 g/dL 7.0 - -  Total Bilirubin 0.3 - 1.2 mg/dL 0.2(L) - -  Alkaline Phos 38 - 126 U/L 81 - -  AST 15 - 41 U/L 16 - -  ALT 0 - 44 U/L 12 - -       DIAGNOSTIC IMAGING:  I have independently reviewed scans.     ASSESSMENT  & PLAN:   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  Stage IV DLBCL: -5 cycles of R mini CHOP from 08/21/2019 through 12/31/2019. -PET scan after 3 cycles on 12/08/2019 showed very good response. -He was hospitalized from 01/15/2020 through 01/18/2020 and was released home on antibiotics for UTI.  He will finish antibiotic tomorrow. -I have reviewed his labs.  White count is 12.6.  LFTs are normal.  Platelet count is 376. -he will proceed with sixth cycle of treatment today. -We will reevaluate him in 3 weeks.  I plan to schedule the PET scan at next visit.  2.  CKD: -Creatinine is improved to 1.31 today.  3.  Pulmonary embolism: -Diagnosed with pulmonary embolism on 08/26/2019. -He is taking Eliquis without any bleeding issues.  4.  Urinary problems: -He has indwelling Foley catheter and failed discontinuation twice. -He will finish antibiotics for UTI tomorrow.   Orders placed this encounter:  No orders of the defined types were placed in this encounter.     Derek Jack, MD [place] [phone]

## 2020-01-21 NOTE — Progress Notes (Signed)
Patient has been assessed, vital signs and labs have been reviewed by Dr. Katragadda. ANC, Creatinine, LFTs, and Platelets are within treatment parameters per Dr. Katragadda. The patient is good to proceed with treatment at this time.  

## 2020-01-21 NOTE — Progress Notes (Signed)
Patient presents today for treatment and follow up visit with Dr. Delton Coombes. Labs pending. Vital signs within parameters for treatment. MAR reviewed. Patient was hospitalized for Pneumonia and Sepsis secondary to a UTI per Dr. Manuella Ghazi notes. 01/15/20-01/18/20. Patient currently taking Augmentin 500mg  BID daily for 4 days. Course to end tomorrow 02/22/20. Patient states he feels great today and wants treatment. Patient teaching performed pertaining to chemotherapy and antibiotic administration. Patient instructed to speak with Dr. Delton Coombes about his concerns. Understanding verbalized.   Message received from [employer] LPN/ Dr. Delton Coombes proceed with treatment.   Treatment given today per MD orders. Tolerated infusion without adverse affects. Vital signs stable. No complaints at this time. Discharged from clinic via wheel chair. F/U with [facility] as scheduled.

## 2020-01-22 DIAGNOSIS — R339 Retention of urine, unspecified: Secondary | ICD-10-CM | POA: Insufficient documentation

## 2020-01-23 ENCOUNTER — Inpatient Hospital Stay (HOSPITAL_COMMUNITY): Payer: Medicare Other

## 2020-01-23 ENCOUNTER — Other Ambulatory Visit: Payer: Self-pay

## 2020-01-23 VITALS — BP 107/60 | HR 54 | Temp 97.1°F | Resp 18

## 2020-01-23 DIAGNOSIS — Z5111 Encounter for antineoplastic chemotherapy: Secondary | ICD-10-CM | POA: Diagnosis not present

## 2020-01-23 DIAGNOSIS — C8513 Unspecified B-cell lymphoma, intra-abdominal lymph nodes: Secondary | ICD-10-CM

## 2020-01-23 DIAGNOSIS — Z95828 Presence of other vascular implants and grafts: Secondary | ICD-10-CM

## 2020-01-23 MED ORDER — PEGFILGRASTIM-JMDB 6 MG/0.6ML ~~LOC~~ SOSY
6.0000 mg | PREFILLED_SYRINGE | Freq: Once | SUBCUTANEOUS | Status: AC
  Administered 2020-01-23: 6 mg via SUBCUTANEOUS

## 2020-01-23 NOTE — Progress Notes (Signed)
Daniel Richards presents today for injection per MD orders. Fulphila 6 mg administered SQ in right Upper Arm. Administration without incident. Patient tolerated well.  Vitals stable and discharged home from clinic ambulatory. Follow up as scheduled.

## 2020-01-23 NOTE — Patient Instructions (Signed)
[facility] at [facility]  Discharge Instructions:   _______________________________________________________________  Thank you for choosing [facility] at [facility] to provide your oncology and hematology care.  To afford each patient quality time with our providers, please arrive at least 15 minutes before your scheduled appointment.  You need to re-schedule your appointment if you arrive 10 or more minutes late.  We strive to give you quality time with our providers, and arriving late affects you and other patients whose appointments are after yours.  Also, if you no show three or more times for appointments you may be dismissed from the clinic.  Again, thank you for choosing [facility] at [facility]. Our hope is that these requests will allow you access to exceptional care and in a timely manner. _______________________________________________________________  If you have questions after your visit, please contact our office at [phone] between the hours of 8:30 a.m. and 5:00 p.m. Voicemails left after 4:30 p.m. will not be returned until the following business day. _______________________________________________________________  For prescription refill requests, have your pharmacy contact our office. _______________________________________________________________  Recommendations made by the consultant and any test results will be sent to your referring physician. _______________________________________________________________ 

## 2020-01-24 ENCOUNTER — Encounter (HOSPITAL_COMMUNITY): Payer: Self-pay | Admitting: Hematology

## 2020-01-24 NOTE — Assessment & Plan Note (Signed)
1.  Stage IV DLBCL: -5 cycles of R mini CHOP from 08/21/2019 through 12/31/2019. -PET scan after 3 cycles on 12/08/2019 showed very good response. -He was hospitalized from 01/15/2020 through 01/18/2020 and was released home on antibiotics for UTI.  He will finish antibiotic tomorrow. -I have reviewed his labs.  White count is 12.6.  LFTs are normal.  Platelet count is 376. -he will proceed with sixth cycle of treatment today. -We will reevaluate him in 3 weeks.  I plan to schedule the PET scan at next visit.  2.  CKD: -Creatinine is improved to 1.31 today.  3.  Pulmonary embolism: -Diagnosed with pulmonary embolism on 08/26/2019. -He is taking Eliquis without any bleeding issues.  4.  Urinary problems: -He has indwelling Foley catheter and failed discontinuation twice. -He will finish antibiotics for UTI tomorrow.

## 2020-01-30 ENCOUNTER — Ambulatory Visit: Payer: Medicare Other

## 2020-02-03 ENCOUNTER — Other Ambulatory Visit (HOSPITAL_COMMUNITY): Payer: Self-pay | Admitting: *Deleted

## 2020-02-03 DIAGNOSIS — C8513 Unspecified B-cell lymphoma, intra-abdominal lymph nodes: Secondary | ICD-10-CM

## 2020-02-05 ENCOUNTER — Other Ambulatory Visit (HOSPITAL_COMMUNITY): Payer: Self-pay | Admitting: Hematology

## 2020-02-05 DIAGNOSIS — Z95828 Presence of other vascular implants and grafts: Secondary | ICD-10-CM

## 2020-02-05 DIAGNOSIS — C8513 Unspecified B-cell lymphoma, intra-abdominal lymph nodes: Secondary | ICD-10-CM

## 2020-02-11 ENCOUNTER — Other Ambulatory Visit: Payer: Self-pay

## 2020-02-11 ENCOUNTER — Inpatient Hospital Stay (HOSPITAL_COMMUNITY): Payer: Medicare Other

## 2020-02-11 ENCOUNTER — Inpatient Hospital Stay (HOSPITAL_COMMUNITY): Payer: Medicare Other | Attending: Hematology | Admitting: Hematology

## 2020-02-11 VITALS — BP 93/49 | HR 63 | Temp 97.9°F | Resp 18 | Wt 166.6 lb

## 2020-02-11 DIAGNOSIS — I10 Essential (primary) hypertension: Secondary | ICD-10-CM | POA: Diagnosis not present

## 2020-02-11 DIAGNOSIS — Z86711 Personal history of pulmonary embolism: Secondary | ICD-10-CM | POA: Diagnosis not present

## 2020-02-11 DIAGNOSIS — K219 Gastro-esophageal reflux disease without esophagitis: Secondary | ICD-10-CM | POA: Diagnosis not present

## 2020-02-11 DIAGNOSIS — D509 Iron deficiency anemia, unspecified: Secondary | ICD-10-CM | POA: Diagnosis not present

## 2020-02-11 DIAGNOSIS — N189 Chronic kidney disease, unspecified: Secondary | ICD-10-CM | POA: Insufficient documentation

## 2020-02-11 DIAGNOSIS — C8513 Unspecified B-cell lymphoma, intra-abdominal lymph nodes: Secondary | ICD-10-CM

## 2020-02-11 DIAGNOSIS — Z7984 Long term (current) use of oral hypoglycemic drugs: Secondary | ICD-10-CM | POA: Diagnosis not present

## 2020-02-11 DIAGNOSIS — C8330 Diffuse large B-cell lymphoma, unspecified site: Secondary | ICD-10-CM | POA: Diagnosis not present

## 2020-02-11 DIAGNOSIS — Z87891 Personal history of nicotine dependence: Secondary | ICD-10-CM | POA: Diagnosis not present

## 2020-02-11 DIAGNOSIS — Z79899 Other long term (current) drug therapy: Secondary | ICD-10-CM | POA: Diagnosis not present

## 2020-02-11 DIAGNOSIS — R300 Dysuria: Secondary | ICD-10-CM | POA: Insufficient documentation

## 2020-02-11 DIAGNOSIS — R197 Diarrhea, unspecified: Secondary | ICD-10-CM | POA: Insufficient documentation

## 2020-02-11 DIAGNOSIS — E785 Hyperlipidemia, unspecified: Secondary | ICD-10-CM | POA: Diagnosis not present

## 2020-02-11 DIAGNOSIS — E119 Type 2 diabetes mellitus without complications: Secondary | ICD-10-CM | POA: Insufficient documentation

## 2020-02-11 DIAGNOSIS — F418 Other specified anxiety disorders: Secondary | ICD-10-CM | POA: Diagnosis not present

## 2020-02-11 DIAGNOSIS — Z809 Family history of malignant neoplasm, unspecified: Secondary | ICD-10-CM | POA: Diagnosis not present

## 2020-02-11 DIAGNOSIS — E876 Hypokalemia: Secondary | ICD-10-CM | POA: Insufficient documentation

## 2020-02-11 DIAGNOSIS — C8336 Diffuse large B-cell lymphoma, intrapelvic lymph nodes: Secondary | ICD-10-CM | POA: Diagnosis not present

## 2020-02-11 DIAGNOSIS — Z7901 Long term (current) use of anticoagulants: Secondary | ICD-10-CM | POA: Diagnosis not present

## 2020-02-11 LAB — PHOSPHORUS: Phosphorus: 4.1 mg/dL (ref 2.5–4.6)

## 2020-02-11 LAB — CBC WITH DIFFERENTIAL/PLATELET
Abs Immature Granulocytes: 0.09 10*3/uL — ABNORMAL HIGH (ref 0.00–0.07)
Basophils Absolute: 0.1 10*3/uL (ref 0.0–0.1)
Basophils Relative: 1 %
Eosinophils Absolute: 0.1 10*3/uL (ref 0.0–0.5)
Eosinophils Relative: 1 %
HCT: 28.7 % — ABNORMAL LOW (ref 39.0–52.0)
Hemoglobin: 8.8 g/dL — ABNORMAL LOW (ref 13.0–17.0)
Immature Granulocytes: 1 %
Lymphocytes Relative: 16 %
Lymphs Abs: 1.2 10*3/uL (ref 0.7–4.0)
MCH: 32.6 pg (ref 26.0–34.0)
MCHC: 30.7 g/dL (ref 30.0–36.0)
MCV: 106.3 fL — ABNORMAL HIGH (ref 80.0–100.0)
Monocytes Absolute: 0.6 10*3/uL (ref 0.1–1.0)
Monocytes Relative: 8 %
Neutro Abs: 5.5 10*3/uL (ref 1.7–7.7)
Neutrophils Relative %: 73 %
Platelets: 292 10*3/uL (ref 150–400)
RBC: 2.7 MIL/uL — ABNORMAL LOW (ref 4.22–5.81)
RDW: 16.6 % — ABNORMAL HIGH (ref 11.5–15.5)
WBC: 7.6 10*3/uL (ref 4.0–10.5)
nRBC: 0 % (ref 0.0–0.2)

## 2020-02-11 LAB — COMPREHENSIVE METABOLIC PANEL
ALT: 13 U/L (ref 0–44)
AST: 15 U/L (ref 15–41)
Albumin: 3.2 g/dL — ABNORMAL LOW (ref 3.5–5.0)
Alkaline Phosphatase: 73 U/L (ref 38–126)
Anion gap: 9 (ref 5–15)
BUN: 22 mg/dL (ref 8–23)
CO2: 22 mmol/L (ref 22–32)
Calcium: 9.1 mg/dL (ref 8.9–10.3)
Chloride: 107 mmol/L (ref 98–111)
Creatinine, Ser: 1.42 mg/dL — ABNORMAL HIGH (ref 0.61–1.24)
GFR calc Af Amer: 56 mL/min — ABNORMAL LOW (ref >60)
GFR calc non Af Amer: 48 mL/min — ABNORMAL LOW (ref >60)
Glucose, Bld: 210 mg/dL — ABNORMAL HIGH (ref 70–99)
Potassium: 3.9 mmol/L (ref 3.5–5.1)
Sodium: 138 mmol/L (ref 135–145)
Total Bilirubin: 0.3 mg/dL (ref 0.3–1.2)
Total Protein: 6.6 g/dL (ref 6.5–8.1)

## 2020-02-11 LAB — URIC ACID: Uric Acid, Serum: 3.3 mg/dL — ABNORMAL LOW (ref 3.7–8.6)

## 2020-02-11 LAB — LACTATE DEHYDROGENASE: LDH: 104 U/L (ref 98–192)

## 2020-02-11 LAB — IRON AND TIBC
Iron: 85 ug/dL (ref 45–182)
Saturation Ratios: 36 % (ref 17.9–39.5)
TIBC: 236 ug/dL — ABNORMAL LOW (ref 250–450)
UIBC: 151 ug/dL

## 2020-02-11 LAB — FOLATE: Folate: 8.8 ng/mL (ref >5.9)

## 2020-02-11 LAB — FERRITIN: Ferritin: 170 ng/mL (ref 24–336)

## 2020-02-11 LAB — MAGNESIUM: Magnesium: 2 mg/dL (ref 1.7–2.4)

## 2020-02-11 LAB — VITAMIN B12: Vitamin B-12: 1665 pg/mL — ABNORMAL HIGH (ref 180–914)

## 2020-02-11 MED ORDER — HEPARIN SOD (PORK) LOCK FLUSH 100 UNIT/ML IV SOLN
500.0000 [IU] | Freq: Once | INTRAVENOUS | Status: AC
  Administered 2020-02-11: 500 [IU] via INTRAVENOUS

## 2020-02-11 MED ORDER — SODIUM CHLORIDE 0.9% FLUSH
20.0000 mL | INTRAVENOUS | Status: DC | PRN
  Administered 2020-02-11: 20 mL via INTRAVENOUS

## 2020-02-11 NOTE — Progress Notes (Signed)
Daniel Richards tolerated portacath lab draw with flush well without complaints or incident. Port accessed with 20 gauge needle with blood drawn for labs ordered then flushed easily per protocol then de-accessed. Pt discharged via wheelchair in satisfactory condition accompanied by family member

## 2020-02-11 NOTE — Assessment & Plan Note (Signed)
1.  Stage IV diffuse large B-cell lymphoma: -6 cycles of R mini CHOP from 08/21/2019 through 01/21/2020. -PET scan on 12/08/2019 showed very good response after 3 cycles. -We have reviewed his labs today.  LDH is normal.  LFTs are also normal.  Patient is also feeling better. -Daughter reports that he has been doing physical therapy very well.  She was wondering if he can get help at home with a nursing aide. -I have recommended doing a PET CT scan and see him back in 2 to 3 weeks.  2.  Macrocytic anemia: -Today hemoglobin is 8.8 with MCV elevated. -He also has CKD.  We will check his ferritin, iron panel, [id number] and folic acid. -We will consider parenteral iron therapy if needed.  3.  Pulmonary embolism: -Diagnosed with bilateral pulmonary embolism on 08/26/2019. -He will continue Eliquis.  He does not have any bleeding problems.  4.  Hypokalemia: -Potassium today 3.9.  We have discontinued his potassium at home.  5.  Urinary problems: -He failed previous trials of discontinuing Foley catheter. -He has a follow-up with urology next week.

## 2020-02-11 NOTE — Patient Instructions (Signed)
[facility] at [facility] Discharge Instructions  Labs drawn from portacath today. Follow-up as scheduled. Call clinic for any questions or concerns   Thank you for choosing [facility] at [facility] to provide your oncology and hematology care.  To afford each patient quality time with our provider, please arrive at least 15 minutes before your scheduled appointment time.   If you have a lab appointment with the Cancer Center please come in thru the Main Entrance and check in at the main information desk.  You need to re-schedule your appointment should you arrive 10 or more minutes late.  We strive to give you quality time with our providers, and arriving late affects you and other patients whose appointments are after yours.  Also, if you no show three or more times for appointments you may be dismissed from the clinic at the providers discretion.     Again, thank you for choosing [facility].  Our hope is that these requests will decrease the amount of time that you wait before being seen by our physicians.       _____________________________________________________________  Should you have questions after your visit to  Cancer Center, please contact our office at [phone] between the hours of 8:00 a.m. and 4:30 p.m.  Voicemails left after 4:00 p.m. will not be returned until the following business day.  For prescription refill requests, have your pharmacy contact our office and allow 72 hours.    Due to Covid, you will need to wear a mask upon entering the hospital. If you do not have a mask, a mask will be given to you at the Main Entrance upon arrival. For doctor visits, patients may have 1 support person with them. For treatment visits, patients can not have anyone with them due to social distancing guidelines and our immunocompromised population.     

## 2020-02-11 NOTE — Patient Instructions (Signed)
[place] at [facility] Discharge Instructions  You were seen today by Dr. Delton Coombes. He went over your recent lab results. We will schedule you for a PET scan.  He will see you back in 2-3 weeks for labs and follow up.   Thank you for choosing [place] at [facility] to provide your oncology and hematology care.  To afford each patient quality time with our provider, please arrive at least 15 minutes before your scheduled appointment time.   If you have a lab appointment with the [facility] [place] please come in thru the  Main Entrance and check in at the main information desk  You need to re-schedule your appointment should you arrive 10 or more minutes late.  We strive to give you quality time with our providers, and arriving late affects you and other patients whose appointments are after yours.  Also, if you no show three or more times for appointments you may be dismissed from the clinic at the providers discretion.     Again, thank you for choosing [facility].  Our hope is that these requests will decrease the amount of time that you wait before being seen by our physicians.       _____________________________________________________________  Should you have questions after your visit to [facility], please contact our office at [phone] between the hours of 8:00 a.m. and 4:30 p.m.  Voicemails left after 4:00 p.m. will not be returned until the following business day.  For prescription refill requests, have your pharmacy contact our office and allow 72 hours.    Cancer Center Support Programs:   > Cancer Support Group  2nd Tuesday of the month 1pm-2pm, Journey Room

## 2020-02-11 NOTE — Progress Notes (Signed)
Daniel Richards, [place] [address]   CLINIC:  Medical Oncology/Hematology  PCP:  [name], [place] [facility] [address] [place] [address] [phone]   REASON FOR VISIT:  Follow-up for lymphoma.  CURRENT THERAPY: R mini CHOP.   INTERVAL HISTORY:  Daniel Richards 74 y.o. male seen for follow-up and toxicity assessment after cycle 6 of chemotherapy.  He is accompanied by his daughter today.  Appetite is 100%.  Energy levels are 75%.  Denies any tingling or numbness next 20s.  Has mild diarrhea.  Is able to walk with the help of walker.  Energy levels are improving.  Denies any fevers, night sweats or weight loss.   REVIEW OF SYSTEMS:  Review of Systems  Gastrointestinal: Positive for diarrhea.  All other systems reviewed and are negative.    PAST MEDICAL/SURGICAL HISTORY:  Past Medical History:  Diagnosis Date  . Acid reflux   . Anxiety and depression   . Cancer ([place])    lymphoma  . Depression   . Diabetes ([place])   . Diabetes mellitus 06/09/2011   pt taking metformin  . DJD of shoulder    right   . Hyperlipidemia   . Hypertension   . Lymphoma ([place])   . Migraines   . Port-A-Cath in place 08/13/2019  . Pulmonary embolism ([place])   . Stomach ulcer   . Tremor   . Tremor   . Vitamin D deficiency    Past Surgical History:  Procedure Laterality Date  . BACK SURGERY    . KNEE SURGERY    . PORTACATH PLACEMENT Left 08/08/2019   Procedure: INSERTION PORT-A-CATH;  Surgeon: Aviva Signs, MD;  Location: [facility];  Service: General;  Laterality: Left;  . SHOULDER SURGERY       SOCIAL HISTORY:  Social History   Socioeconomic History  . Marital status: Divorced    Spouse name: Not on file  . Number of children: 2  . Years of education: 12+  . Highest education level: Not on file  Occupational History  . Not on file  Tobacco Use  . Smoking status: Former Smoker    Packs/day: 2.00   Years: 20.00    Pack years: 40.00    Types: Cigarettes    Quit date: 11/17/1989    Years since quitting: 30.2  . Smokeless tobacco: Never Used  Substance and Sexual Activity  . Alcohol use: No  . Drug use: No  . Sexual activity: Not Currently  Other Topics Concern  . Not on file  Social History Narrative   Lives at home with son.   Caffeine use: Drinks 1 cup coffee (3 cups per week-decaf)   Social Determinants of Health   Financial Resource Strain: Medium Risk  . Difficulty of Paying Living Expenses: Somewhat hard  Food Insecurity: No Food Insecurity  . Worried About Charity fundraiser in the Last Year: Never true  . Ran Out of Food in the Last Year: Never true  Transportation Needs: No Transportation Needs  . Lack of Transportation (Medical): No  . Lack of Transportation (Non-Medical): No  Physical Activity: Inactive  . Days of Exercise per Week: 0 days  . Minutes of Exercise per Session: 0 min  Stress: No Stress Concern Present  . Feeling of Stress : Not at all  Social Connections: Moderately Isolated  . Frequency of Communication with Friends and Family: Never  . Frequency of  Social Gatherings with Friends and Family: Twice a week  . Attends Religious Services: More than 4 times per year  . Active Member of Clubs or Organizations: No  . Attends Archivist Meetings: Never  . Marital Status: Divorced  Human resources officer Violence: Not At Risk  . Fear of Current or Ex-Partner: No  . Emotionally Abused: No  . Physically Abused: No  . Sexually Abused: No    FAMILY HISTORY:  Family History  Problem Relation Age of Onset  . Cancer Sister   . Cancer Sister   . Cancer Brother   . Heart attack Father   . Cancer Brother   . Heart attack Brother   . Healthy Son   . Healthy Daughter   . Migraines Neg Hx     CURRENT MEDICATIONS:  Outpatient Encounter Medications as of 02/11/2020  Medication Sig  . allopurinol (ZYLOPRIM) 300 MG tablet Take 1 tablet (300 mg  total) by mouth daily.  . calcitRIOL (ROCALTROL) 0.5 MCG capsule Take 1 capsule (0.5 mcg total) by mouth daily.  . citalopram (CELEXA) 20 MG tablet Take 1 tablet (20 mg total) by mouth at bedtime. (Patient taking differently: Take 40 mg by mouth at bedtime. )  . ELIQUIS 5 MG TABS tablet Take 5 mg by mouth 2 (two) times daily.  . feeding supplement, ENSURE ENLIVE, (ENSURE ENLIVE) LIQD Take 237 mLs by mouth 2 (two) times daily between meals.  . folic acid (FOLVITE) 1 MG tablet Take 1 tablet (1 mg total) by mouth daily.  Marland Kitchen gabapentin (NEURONTIN) 300 MG capsule Take 2 capsules (600 mg total) by mouth 3 (three) times daily.  Marland Kitchen lidocaine-prilocaine (EMLA) cream Apply a small amount to port a cath site and cover with plastic wrap 1 hour prior to chemotherapy appointments  . metFORMIN (GLUCOPHAGE) 500 MG tablet Take 1 tablet (500 mg total) by mouth daily.  . pantoprazole (PROTONIX) 40 MG tablet Take 1 tablet (40 mg total) by mouth 2 (two) times daily before a meal.  . predniSONE (DELTASONE) 20 MG tablet Take 3 tablets daily on days 1-5 of chemotherapy. (Patient taking differently: Take 60 mg by mouth See admin instructions. Take 60mg  (3 tablets total) on days 1 through 5 of chemotherapy)  . primidone (MYSOLINE) 50 MG tablet Take 1.5 tablets (75 mg total) by mouth every 8 (eight) hours. (Patient taking differently: Take 100 mg by mouth 2 (two) times daily. )  . propranolol (INDERAL) 40 MG tablet Take 1 tablet (40 mg total) by mouth 2 (two) times daily.  . tamsulosin (FLOMAX) 0.4 MG CAPS capsule Take 1 capsule (0.4 mg total) by mouth daily after supper.  . temazepam (RESTORIL) 15 MG capsule Take 1 capsule (15 mg total) by mouth at bedtime.  . topiramate (TOPAMAX) 100 MG tablet Take 2 tablets (200 mg total) by mouth at bedtime.  . vinCRIStine 2 mg in sodium chloride 0.9 % 50 mL Inject 2 mg into the vein every 21 ( twenty-one) days.  Marland Kitchen acetaminophen (TYLENOL) 325 MG tablet Take 2 tablets (650 mg total) by mouth  every 6 (six) hours as needed for mild pain, fever or headache. (Patient not taking: Reported on 01/21/2020)  . albuterol (VENTOLIN HFA) 108 (90 Base) MCG/ACT inhaler Inhale 2 puffs into the lungs every 6 (six) hours as needed for wheezing or shortness of breath. (Patient not taking: Reported on 01/21/2020)  . CYCLOPHOSPHAMIDE IV Inject into the vein every 21 ( twenty-one) days.  Marland Kitchen DOXORUBICIN HCL IV Inject into the  vein every 21 ( twenty-one) days.  Marland Kitchen GAVILAX 17 GM/SCOOP powder Take 17 g by mouth daily as needed for mild constipation or moderate constipation.   Marland Kitchen HYDROcodone-acetaminophen (NORCO) 10-325 MG tablet Take 1 tablet by mouth every 6 (six) hours as needed for moderate pain or severe pain.   Marland Kitchen loperamide (IMODIUM) 2 MG capsule Take 1 capsule (2 mg total) by mouth as needed for diarrhea or loose stools. (Patient not taking: Reported on 01/21/2020)  . ondansetron (ZOFRAN) 4 MG tablet Take 1 tablet (4 mg total) by mouth every 6 (six) hours as needed for nausea. (Patient not taking: Reported on 01/21/2020)  . prochlorperazine (COMPAZINE) 10 MG tablet Take 1 tablet (10 mg total) by mouth every 6 (six) hours as needed (Nausea or vomiting). (Patient not taking: Reported on 01/21/2020)  . riTUXimab in sodium chloride 0.9 % 250 mL Inject into the vein every 21 ( twenty-one) days.  . [EXPIRED] heparin lock flush 100 unit/mL   . [DISCONTINUED] sodium chloride flush (NS) 0.9 % injection 20 mL    No facility-administered encounter medications on file as of 02/11/2020.    ALLERGIES:  Allergies  Allergen Reactions  . Vancomycin Hives    Patient received Benadryl to treat the hives     PHYSICAL EXAM:  ECOG Performance status: 2  Vitals:   02/11/20 1021 02/11/20 1125  BP: (!) 87/60 (!) 93/49  Pulse: 63   Resp: 18   Temp: 97.9 F (36.6 C)   SpO2: 100%    Filed Weights   02/11/20 1125  Weight: 166 lb 9.6 oz (75.6 kg)    Physical Exam Vitals reviewed.  Constitutional:      Appearance:  Normal appearance.  Cardiovascular:     Rate and Rhythm: Normal rate and regular rhythm.     Heart sounds: Normal heart sounds.  Pulmonary:     Effort: Pulmonary effort is normal.     Breath sounds: Normal breath sounds.  Abdominal:     General: There is no distension.     Palpations: Abdomen is soft. There is no mass.  Musculoskeletal:        General: No swelling.  Skin:    General: Skin is warm.  Neurological:     General: No focal deficit present.     Mental Status: He is alert and oriented to person, place, and time.  Psychiatric:        Mood and Affect: Mood normal.        Behavior: Behavior normal.   He has indwelling Foley catheter.   LABORATORY DATA:  I have reviewed the labs as listed.  CBC    Component Value Date/Time   WBC 7.6 02/11/2020 1037   RBC 2.70 (L) 02/11/2020 1037   HGB 8.8 (L) 02/11/2020 1037   HCT 28.7 (L) 02/11/2020 1037   PLT 292 02/11/2020 1037   MCV 106.3 (H) 02/11/2020 1037   MCH 32.6 02/11/2020 1037   MCHC 30.7 02/11/2020 1037   RDW 16.6 (H) 02/11/2020 1037   LYMPHSABS 1.2 02/11/2020 1037   MONOABS 0.6 02/11/2020 1037   EOSABS 0.1 02/11/2020 1037   BASOSABS 0.1 02/11/2020 1037   CMP Latest Ref Rng & Units 02/11/2020 01/21/2020 01/18/2020  Glucose 70 - 99 mg/dL 210(H) 147(H) 89  BUN 8 - 23 mg/dL 22 20 20   Creatinine 0.61 - 1.24 mg/dL 1.42(H) 1.31(H) 1.44(H)  Sodium 135 - 145 mmol/L 138 140 138  Potassium 3.5 - 5.1 mmol/L 3.9 4.3 4.0  Chloride 98 -  111 mmol/L 107 106 108  CO2 22 - 32 mmol/L 22 24 23   Calcium 8.9 - 10.3 mg/dL 9.1 9.3 8.4(L)  Total Protein 6.5 - 8.1 g/dL 6.6 7.0 -  Total Bilirubin 0.3 - 1.2 mg/dL 0.3 0.2(L) -  Alkaline Phos 38 - 126 U/L 73 81 -  AST 15 - 41 U/L 15 16 -  ALT 0 - 44 U/L 13 12 -       DIAGNOSTIC IMAGING:  I have reviewed the scans.     ASSESSMENT & PLAN:   Diffuse large B cell lymphoma (HCC) 1.  Stage IV diffuse large B-cell lymphoma: -6 cycles of R mini CHOP from 08/21/2019 through  01/21/2020. -PET scan on 12/08/2019 showed very good response after 3 cycles. -We have reviewed his labs today.  LDH is normal.  LFTs are also normal.  Patient is also feeling better. -Daughter reports that he has been doing physical therapy very well.  She was wondering if he can get help at home with a nursing aide. -I have recommended doing a PET CT scan and see him back in 2 to 3 weeks.  2.  Macrocytic anemia: -Today hemoglobin is 8.8 with MCV elevated. -He also has CKD.  We will check his ferritin, iron panel, [id number] and folic acid. -We will consider parenteral iron therapy if needed.  3.  Pulmonary embolism: -Diagnosed with bilateral pulmonary embolism on 08/26/2019. -He will continue Eliquis.  He does not have any bleeding problems.  4.  Hypokalemia: -Potassium today 3.9.  We have discontinued his potassium at home.  5.  Urinary problems: -He failed previous trials of discontinuing Foley catheter. -He has a follow-up with urology next week.   Orders placed this encounter:  Orders Placed This Encounter  Procedures  . NM PET Image Restag (PS) Skull Base To Thigh  . Ferritin  . Iron and TIBC  . Vitamin B12  . Folate      Derek Jack, MD [place] [phone]

## 2020-02-18 ENCOUNTER — Other Ambulatory Visit: Payer: Self-pay | Admitting: Adult Health

## 2020-02-20 ENCOUNTER — Ambulatory Visit (INDEPENDENT_AMBULATORY_CARE_PROVIDER_SITE_OTHER): Payer: Medicare Other

## 2020-02-20 ENCOUNTER — Other Ambulatory Visit: Payer: Self-pay

## 2020-02-20 VITALS — Temp 97.7°F

## 2020-02-20 DIAGNOSIS — R339 Retention of urine, unspecified: Secondary | ICD-10-CM | POA: Diagnosis not present

## 2020-02-20 NOTE — Progress Notes (Signed)
Cath Change/ Replacement  Patient is present today for a catheter change due to urinary retention.  8 ml of water was removed from the balloon, a 16 FR foley cath was removed with out difficulty.  Patient was cleaned and prepped in a sterile fashion with betadine. A 16  FR foley cath was replaced into the bladder no complications were noted Urine return was noted 10 ml and urine was yellow in color. The balloon was filled with 33ml of sterile water. A bedside bag was attached for drainage.  A night bag was also given to the patient and patient was given instruction on how to change from one bag to another. Patient was given proper instruction on catheter care.    Performed by: H. Odaliz Mcqueary LPN  Follow up: 1 month cath change

## 2020-02-25 ENCOUNTER — Other Ambulatory Visit: Payer: Self-pay | Admitting: Adult Health

## 2020-03-01 ENCOUNTER — Ambulatory Visit (HOSPITAL_COMMUNITY)
Admission: RE | Admit: 2020-03-01 | Discharge: 2020-03-01 | Disposition: A | Discharge status: Home / Self Care | Payer: Medicare Other | Source: Ambulatory Visit | Attending: Hematology | Admitting: Hematology

## 2020-03-01 ENCOUNTER — Other Ambulatory Visit: Payer: Self-pay

## 2020-03-01 DIAGNOSIS — C8513 Unspecified B-cell lymphoma, intra-abdominal lymph nodes: Secondary | ICD-10-CM | POA: Insufficient documentation

## 2020-03-01 DIAGNOSIS — Z79899 Other long term (current) drug therapy: Secondary | ICD-10-CM | POA: Insufficient documentation

## 2020-03-01 LAB — GLUCOSE, CAPILLARY: Glucose-Capillary: 117 mg/dL — ABNORMAL HIGH (ref 70–99)

## 2020-03-01 MED ORDER — FLUDEOXYGLUCOSE F - 18 (FDG) INJECTION
7.4900 | Freq: Once | INTRAVENOUS | Status: AC | PRN
  Administered 2020-03-01: 7.49 via INTRAVENOUS

## 2020-03-04 ENCOUNTER — Inpatient Hospital Stay (HOSPITAL_COMMUNITY): Payer: Medicare Other

## 2020-03-04 ENCOUNTER — Inpatient Hospital Stay (HOSPITAL_BASED_OUTPATIENT_CLINIC_OR_DEPARTMENT_OTHER): Payer: Medicare Other | Admitting: Hematology

## 2020-03-04 ENCOUNTER — Other Ambulatory Visit: Payer: Self-pay

## 2020-03-04 VITALS — BP 102/56 | HR 61 | Temp 98.0°F | Resp 18 | Wt 174.2 lb

## 2020-03-04 DIAGNOSIS — C8330 Diffuse large B-cell lymphoma, unspecified site: Secondary | ICD-10-CM | POA: Diagnosis not present

## 2020-03-04 DIAGNOSIS — C8336 Diffuse large B-cell lymphoma, intrapelvic lymph nodes: Secondary | ICD-10-CM | POA: Diagnosis not present

## 2020-03-04 DIAGNOSIS — C8513 Unspecified B-cell lymphoma, intra-abdominal lymph nodes: Secondary | ICD-10-CM

## 2020-03-04 LAB — COMPREHENSIVE METABOLIC PANEL
ALT: 13 U/L (ref 0–44)
AST: 16 U/L (ref 15–41)
Albumin: 3.6 g/dL (ref 3.5–5.0)
Alkaline Phosphatase: 78 U/L (ref 38–126)
Anion gap: 9 (ref 5–15)
BUN: 25 mg/dL — ABNORMAL HIGH (ref 8–23)
CO2: 27 mmol/L (ref 22–32)
Calcium: 9.1 mg/dL (ref 8.9–10.3)
Chloride: 100 mmol/L (ref 98–111)
Creatinine, Ser: 1.59 mg/dL — ABNORMAL HIGH (ref 0.61–1.24)
GFR calc Af Amer: 48 mL/min — ABNORMAL LOW (ref >60)
GFR calc non Af Amer: 42 mL/min — ABNORMAL LOW (ref >60)
Glucose, Bld: 127 mg/dL — ABNORMAL HIGH (ref 70–99)
Potassium: 4.1 mmol/L (ref 3.5–5.1)
Sodium: 136 mmol/L (ref 135–145)
Total Bilirubin: 0.5 mg/dL (ref 0.3–1.2)
Total Protein: 6.9 g/dL (ref 6.5–8.1)

## 2020-03-04 LAB — LACTATE DEHYDROGENASE: LDH: 111 U/L (ref 98–192)

## 2020-03-04 LAB — CBC WITH DIFFERENTIAL/PLATELET
Abs Immature Granulocytes: 0.02 10*3/uL (ref 0.00–0.07)
Basophils Absolute: 0.1 10*3/uL (ref 0.0–0.1)
Basophils Relative: 1 %
Eosinophils Absolute: 0.3 10*3/uL (ref 0.0–0.5)
Eosinophils Relative: 6 %
HCT: 29.5 % — ABNORMAL LOW (ref 39.0–52.0)
Hemoglobin: 9.2 g/dL — ABNORMAL LOW (ref 13.0–17.0)
Immature Granulocytes: 0 %
Lymphocytes Relative: 29 %
Lymphs Abs: 1.5 10*3/uL (ref 0.7–4.0)
MCH: 34.1 pg — ABNORMAL HIGH (ref 26.0–34.0)
MCHC: 31.2 g/dL (ref 30.0–36.0)
MCV: 109.3 fL — ABNORMAL HIGH (ref 80.0–100.0)
Monocytes Absolute: 0.6 10*3/uL (ref 0.1–1.0)
Monocytes Relative: 11 %
Neutro Abs: 2.7 10*3/uL (ref 1.7–7.7)
Neutrophils Relative %: 53 %
Platelets: 248 10*3/uL (ref 150–400)
RBC: 2.7 MIL/uL — ABNORMAL LOW (ref 4.22–5.81)
RDW: 15.9 % — ABNORMAL HIGH (ref 11.5–15.5)
WBC: 5.1 10*3/uL (ref 4.0–10.5)
nRBC: 0 % (ref 0.0–0.2)

## 2020-03-04 LAB — MAGNESIUM: Magnesium: 2 mg/dL (ref 1.7–2.4)

## 2020-03-04 NOTE — Patient Instructions (Addendum)
[place] at [facility] Discharge Instructions  You were seen today by Dr. Delton Coombes. He went over your recent lab and scan results. He will see you back in for labs and follow up.   Thank you for choosing [place] at [facility] to provide your oncology and hematology care.  To afford each patient quality time with our provider, please arrive at least 15 minutes before your scheduled appointment time.   If you have a lab appointment with the [facility] please come in thru the  Main Entrance and check in at the main information desk  You need to re-schedule your appointment should you arrive 10 or more minutes late.  We strive to give you quality time with our providers, and arriving late affects you and other patients whose appointments are after yours.  Also, if you no show three or more times for appointments you may be dismissed from the clinic at the providers discretion.     Again, thank you for choosing [facility]  Our hope is that these requests will decrease the amount of time that you wait before being seen by our physicians.       _____________________________________________________________  Should you have questions after your visit to [facility], please contact our office at [phone] between the hours of 8:00 a.m. and 4:30 p.m.  Voicemails left after 4:00 p.m. will not be returned until the following business day.  For prescription refill requests, have your pharmacy contact our office and allow 72 hours.    Cancer Center Support Programs:   > Cancer Support Group  2nd Tuesday of the month 1pm-2pm, Journey Room

## 2020-03-04 NOTE — Progress Notes (Signed)
[place], [place] [address]   CLINIC:  Medical Oncology/Hematology  PCP:  [name], [facility] [address] [place] [address] [place] [address] [phone]   REASON FOR VISIT:  Follow-up for lymphoma.  CURRENT THERAPY: Surveillance.   INTERVAL HISTORY:  Daniel Richards 74 y.o. male seen for follow-up and toxicity assessment upon completion of 6 cycles of chemotherapy.  He is accompanied by his son today.  He is gradually getting stronger.  Appetite is 100%.  Energy levels are 75%.  He reports occasional mucus-like bowel movements.  He reports that he had at even prior to start of chemotherapy last year.  Denies any blood in the stools.  Denies any tingling or numbness next [place].   REVIEW OF SYSTEMS:  Review of Systems  Gastrointestinal: Positive for diarrhea.  All other systems reviewed and are negative.    PAST MEDICAL/SURGICAL HISTORY:  Past Medical History:  Diagnosis Date  . Acid reflux   . Anxiety and depression   . Cancer ([place])    lymphoma  . Depression   . Diabetes ([place])   . Diabetes mellitus 06/09/2011   pt taking metformin  . DJD of shoulder    right   . Hyperlipidemia   . Hypertension   . Lymphoma ([place])   . Migraines   . Port-A-Cath in place 08/13/2019  . Pulmonary embolism ([place])   . Stomach ulcer   . Tremor   . Tremor   . Vitamin D deficiency    Past Surgical History:  Procedure Laterality Date  . BACK SURGERY    . KNEE SURGERY    . PORTACATH PLACEMENT Left 08/08/2019   Procedure: INSERTION PORT-A-CATH;  Surgeon: Aviva Signs, MD;  Location: [facility];  Service: General;  Laterality: Left;  . SHOULDER SURGERY       SOCIAL HISTORY:  Social History   Socioeconomic History  . Marital status: Divorced    Spouse name: Not on file  . Number of children: 2  . Years of education: 12+  . Highest education level: Not on file  Occupational History  . Not on file    Tobacco Use  . Smoking status: Former Smoker    Packs/day: 2.00    Years: 20.00    Pack years: 40.00    Types: Cigarettes    Quit date: 11/17/1989    Years since quitting: 30.3  . Smokeless tobacco: Never Used  Substance and Sexual Activity  . Alcohol use: No  . Drug use: No  . Sexual activity: Not Currently  Other Topics Concern  . Not on file  Social History Narrative   Lives at home with son.   Caffeine use: Drinks 1 cup coffee (3 cups per week-decaf)   Social Determinants of Health   Financial Resource Strain: Medium Risk  . Difficulty of Paying Living Expenses: Somewhat hard  Food Insecurity: No Food Insecurity  . Worried About Charity fundraiser in the Last Year: Never true  . Ran Out of Food in the Last Year: Never true  Transportation Needs: No Transportation Needs  . Lack of Transportation (Medical): No  . Lack of Transportation (Non-Medical): No  Physical Activity: Inactive  . Days of Exercise per Week: 0 days  . Minutes of Exercise per Session: 0 min  Stress: No Stress Concern Present  . Feeling of Stress : Not at all  Social Connections: Moderately Isolated  . Frequency of  Communication with Friends and Family: Never  . Frequency of Social Gatherings with Friends and Family: Twice a week  . Attends Religious Services: More than 4 times per year  . Active Member of Clubs or Organizations: No  . Attends Archivist Meetings: Never  . Marital Status: Divorced  Human resources officer Violence: Not At Risk  . Fear of Current or Ex-Partner: No  . Emotionally Abused: No  . Physically Abused: No  . Sexually Abused: No    FAMILY HISTORY:  Family History  Problem Relation Age of Onset  . Cancer Sister   . Cancer Sister   . Cancer Brother   . Heart attack Father   . Cancer Brother   . Heart attack Brother   . Healthy Son   . Healthy Daughter   . Migraines Neg Hx     CURRENT MEDICATIONS:  Outpatient Encounter Medications as of 03/04/2020  Medication  Sig  . allopurinol (ZYLOPRIM) 300 MG tablet Take 1 tablet (300 mg total) by mouth daily.  . calcitRIOL (ROCALTROL) 0.5 MCG capsule Take 1 capsule (0.5 mcg total) by mouth daily.  . citalopram (CELEXA) 20 MG tablet Take 1 tablet (20 mg total) by mouth at bedtime. (Patient taking differently: Take 40 mg by mouth at bedtime. )  . ELIQUIS 5 MG TABS tablet Take 5 mg by mouth 2 (two) times daily.  . folic acid (FOLVITE) 1 MG tablet Take 1 tablet (1 mg total) by mouth daily.  Marland Kitchen gabapentin (NEURONTIN) 300 MG capsule Take 2 capsules (600 mg total) by mouth 3 (three) times daily.  . metFORMIN (GLUCOPHAGE) 500 MG tablet Take 1 tablet (500 mg total) by mouth daily.  . pantoprazole (PROTONIX) 40 MG tablet Take 1 tablet (40 mg total) by mouth 2 (two) times daily before a meal.  . predniSONE (DELTASONE) 20 MG tablet Take 3 tablets daily on days 1-5 of chemotherapy. (Patient taking differently: Take 60 mg by mouth See admin instructions. Take 60mg  (3 tablets total) on days 1 through 5 of chemotherapy)  . primidone (MYSOLINE) 50 MG tablet Take 1.5 tablets (75 mg total) by mouth every 8 (eight) hours. (Patient taking differently: Take 100 mg by mouth 2 (two) times daily. )  . propranolol (INDERAL) 40 MG tablet Take 1 tablet (40 mg total) by mouth 2 (two) times daily.  . tamsulosin (FLOMAX) 0.4 MG CAPS capsule Take 1 capsule (0.4 mg total) by mouth daily after supper.  . temazepam (RESTORIL) 15 MG capsule Take 1 capsule (15 mg total) by mouth at bedtime.  . topiramate (TOPAMAX) 100 MG tablet Take 2 tablets (200 mg total) by mouth at bedtime.  Marland Kitchen acetaminophen (TYLENOL) 325 MG tablet Take 2 tablets (650 mg total) by mouth every 6 (six) hours as needed for mild pain, fever or headache. (Patient not taking: Reported on 01/21/2020)  . albuterol (VENTOLIN HFA) 108 (90 Base) MCG/ACT inhaler Inhale 2 puffs into the lungs every 6 (six) hours as needed for wheezing or shortness of breath. (Patient not taking: Reported on  01/21/2020)  . CYCLOPHOSPHAMIDE IV Inject into the vein every 21 ( twenty-one) days.  Marland Kitchen DOXORUBICIN HCL IV Inject into the vein every 21 ( twenty-one) days.  . feeding supplement, ENSURE ENLIVE, (ENSURE ENLIVE) LIQD Take 237 mLs by mouth 2 (two) times daily between meals. (Patient not taking: Reported on 03/04/2020)  . GAVILAX 17 GM/SCOOP powder Take 17 g by mouth daily as needed for mild constipation or moderate constipation.   Marland Kitchen HYDROcodone-acetaminophen (NORCO) 10-325  MG tablet Take 1 tablet by mouth every 6 (six) hours as needed for moderate pain or severe pain.   Marland Kitchen lidocaine-prilocaine (EMLA) cream Apply a small amount to port a cath site and cover with plastic wrap 1 hour prior to chemotherapy appointments (Patient not taking: Reported on 03/04/2020)  . loperamide (IMODIUM) 2 MG capsule Take 1 capsule (2 mg total) by mouth as needed for diarrhea or loose stools. (Patient not taking: Reported on 01/21/2020)  . ondansetron (ZOFRAN) 4 MG tablet Take 1 tablet (4 mg total) by mouth every 6 (six) hours as needed for nausea. (Patient not taking: Reported on 01/21/2020)  . prochlorperazine (COMPAZINE) 10 MG tablet Take 1 tablet (10 mg total) by mouth every 6 (six) hours as needed (Nausea or vomiting). (Patient not taking: Reported on 01/21/2020)  . riTUXimab in sodium chloride 0.9 % 250 mL Inject into the vein every 21 ( twenty-one) days.  . vinCRIStine 2 mg in sodium chloride 0.9 % 50 mL Inject 2 mg into the vein every 21 ( twenty-one) days.   No facility-administered encounter medications on file as of 03/04/2020.    ALLERGIES:  Allergies  Allergen Reactions  . Vancomycin Hives    Patient received Benadryl to treat the hives     PHYSICAL EXAM:  ECOG Performance status: 2  Vitals:   03/04/20 1216  BP: (!) 102/56  Pulse: 61  Resp: 18  Temp: 98 F (36.7 C)  SpO2: 100%   Filed Weights   03/04/20 1216  Weight: 174 lb 3.2 oz (79 kg)    Physical Exam Vitals reviewed.  Constitutional:       Appearance: Normal appearance.  Cardiovascular:     Rate and Rhythm: Normal rate and regular rhythm.     Heart sounds: Normal heart sounds.  Pulmonary:     Effort: Pulmonary effort is normal.     Breath sounds: Normal breath sounds.  Abdominal:     General: There is no distension.     Palpations: Abdomen is soft. There is no mass.  Musculoskeletal:        General: No swelling.  Skin:    General: Skin is warm.  Neurological:     General: No focal deficit present.     Mental Status: He is alert and oriented to person, place, and time.  Psychiatric:        Mood and Affect: Mood normal.        Behavior: Behavior normal.   Indwelling Foley catheter present.   LABORATORY DATA:  I have reviewed the labs as listed.  CBC    Component Value Date/Time   WBC 5.1 03/04/2020 1145   RBC 2.70 (L) 03/04/2020 1145   HGB 9.2 (L) 03/04/2020 1145   HCT 29.5 (L) 03/04/2020 1145   PLT 248 03/04/2020 1145   MCV 109.3 (H) 03/04/2020 1145   MCH 34.1 (H) 03/04/2020 1145   MCHC 31.2 03/04/2020 1145   RDW 15.9 (H) 03/04/2020 1145   LYMPHSABS 1.5 03/04/2020 1145   MONOABS 0.6 03/04/2020 1145   EOSABS 0.3 03/04/2020 1145   BASOSABS 0.1 03/04/2020 1145   CMP Latest Ref Rng & Units 03/04/2020 02/11/2020 01/21/2020  Glucose 70 - 99 mg/dL 127(H) 210(H) 147(H)  BUN 8 - 23 mg/dL 25(H) 22 20  Creatinine 0.61 - 1.24 mg/dL 1.59(H) 1.42(H) 1.31(H)  Sodium 135 - 145 mmol/L 136 138 140  Potassium 3.5 - 5.1 mmol/L 4.1 3.9 4.3  Chloride 98 - 111 mmol/L 100 107 106  CO2 22 -  32 mmol/L 27 22 24   Calcium 8.9 - 10.3 mg/dL 9.1 9.1 9.3  Total Protein 6.5 - 8.1 g/dL 6.9 6.6 7.0  Total Bilirubin 0.3 - 1.2 mg/dL 0.5 0.3 0.2(L)  Alkaline Phos 38 - 126 U/L 78 73 81  AST 15 - 41 U/L 16 15 16   ALT 0 - 44 U/L 13 13 12        DIAGNOSTIC IMAGING:  I have independently reviewed scans with the patient.     ASSESSMENT & PLAN:   Diffuse large B cell lymphoma (HCC) 1.  Stage IV DLBCL: -6 Cycles of R mini CHOP  from 08/21/2019 through 01/21/2020. -We reviewed his labs.  LDH is 111. -PET scan on 03/01/2020 reviewed by me showed single modular thickening in the deep left pelvis with metabolic activity slightly above the background liver, Deauville 4.  This is decreased from comparison PET scan on 12/07/2018.  No additional hypermetabolic activities in the chest, abdomen or pelvis.  No new disease. -He is gradually recovering from previous chemotherapy.  I have recommended a follow-up PET CT scan in 2 months.  2.  Macrocytic anemia: -Hemoglobin is gradually improving.  Today it is 9.2 with MCV of 109. -This is from combination of CKD and myelosuppression from chemotherapy. -Labs on 02/11/2020 shows ferritin 170, percent saturation of 36.  Folic acid and 123456 was normal.  3.  Pulmonary embolism: -Diagnosed with bilateral PE on 08/26/2019.  He will continue Eliquis.  4.  Hypokalemia: -His potassium is 4.1 and holding despite discontinuation of potassium at home.  5.  Urinary problems: -He has a Foley catheter.  He has follow-up with urology.   Orders placed this encounter:  Orders Placed This Encounter  Procedures  . NM PET Image Restag (PS) Skull Base To Thigh  . CBC with Differential  . Comprehensive metabolic panel  . Lactate dehydrogenase      Derek Jack, MD [place] [phone]

## 2020-03-06 NOTE — Assessment & Plan Note (Signed)
1.  Stage IV DLBCL: -6 Cycles of R mini CHOP from 08/21/2019 through 01/21/2020. -We reviewed his labs.  LDH is 111. -PET scan on 03/01/2020 reviewed by me showed single modular thickening in the deep left pelvis with metabolic activity slightly above the background liver, Deauville 4.  This is decreased from comparison PET scan on 12/07/2018.  No additional hypermetabolic activities in the chest, abdomen or pelvis.  No new disease. -He is gradually recovering from previous chemotherapy.  I have recommended a follow-up PET CT scan in 2 months.  2.  Macrocytic anemia: -Hemoglobin is gradually improving.  Today it is 9.2 with MCV of 109. -This is from combination of CKD and myelosuppression from chemotherapy. -Labs on 02/11/2020 shows ferritin 170, percent saturation of 36.  Folic acid and 123456 was normal.  3.  Pulmonary embolism: -Diagnosed with bilateral PE on 08/26/2019.  He will continue Eliquis.  4.  Hypokalemia: -His potassium is 4.1 and holding despite discontinuation of potassium at home.  5.  Urinary problems: -He has a Foley catheter.  He has follow-up with urology.

## 2020-03-17 ENCOUNTER — Ambulatory Visit (INDEPENDENT_AMBULATORY_CARE_PROVIDER_SITE_OTHER): Payer: Medicare Other

## 2020-03-17 ENCOUNTER — Other Ambulatory Visit: Payer: Self-pay

## 2020-03-17 DIAGNOSIS — R339 Retention of urine, unspecified: Secondary | ICD-10-CM

## 2020-03-17 NOTE — Progress Notes (Signed)
Cath Change/ Replacement  Patient is present today for a catheter change due to urinary retention.  81ml of water was removed from the balloon, a 16FR foley cath was removed with out difficulty.  Patient was cleaned and prepped in a sterile fashion with betadine. A 16 FR foley cath was replaced into the bladder no complications were noted Urine return was noted 5 ml and urine was lt yellow in color. The balloon was filled with 53ml of sterile water. A leg bag was attached for drainage.  A night bag was also given to the patient and patient was given instruction on how to change from one bag to another. Patient was given proper instruction on catheter care.    Performed by: Celina Shiley,lpn  Follow up: 1 month

## 2020-03-19 ENCOUNTER — Ambulatory Visit: Payer: Medicare Other

## 2020-04-19 ENCOUNTER — Ambulatory Visit: Payer: Medicare Other

## 2020-04-21 ENCOUNTER — Ambulatory Visit (INDEPENDENT_AMBULATORY_CARE_PROVIDER_SITE_OTHER): Payer: Medicare Other

## 2020-04-21 ENCOUNTER — Other Ambulatory Visit: Payer: Self-pay

## 2020-04-21 DIAGNOSIS — R339 Retention of urine, unspecified: Secondary | ICD-10-CM

## 2020-04-21 NOTE — Patient Instructions (Signed)

## 2020-04-21 NOTE — Progress Notes (Signed)
Cath Change/ Replacement  Patient is present today for a catheter change due to urinary retention.  21ml of water was removed from the balloon, a 16FR foley cath was removed with out difficulty.  Patient was cleaned and prepped in a sterile fashion with betadine. A 16 FR foley cath was replaced into the bladder no complications were noted Urine return was noted 83ml and urine was light yellow in color. The balloon was filled with 85ml of sterile water. A bedside  bag was attached for drainage. Patient was given proper instruction on catheter care.  Pt voiced the desire to have foley cath removed. Pt. scheduled for 1 month OV with Dr. to discuss.  Performed by: Jarrin Staley LPN  Follow up: 1 month OV/w cath change

## 2020-04-26 ENCOUNTER — Other Ambulatory Visit: Payer: Self-pay

## 2020-04-26 ENCOUNTER — Inpatient Hospital Stay (HOSPITAL_COMMUNITY): Payer: Medicare Other | Attending: Hematology

## 2020-04-26 ENCOUNTER — Encounter (HOSPITAL_COMMUNITY)
Admission: RE | Admit: 2020-04-26 | Discharge: 2020-04-26 | Disposition: A | Discharge status: Home / Self Care | Payer: Medicare Other | Source: Ambulatory Visit | Attending: Hematology | Admitting: Hematology

## 2020-04-26 DIAGNOSIS — M545 Low back pain: Secondary | ICD-10-CM | POA: Insufficient documentation

## 2020-04-26 DIAGNOSIS — G8929 Other chronic pain: Secondary | ICD-10-CM | POA: Diagnosis not present

## 2020-04-26 DIAGNOSIS — E559 Vitamin D deficiency, unspecified: Secondary | ICD-10-CM | POA: Insufficient documentation

## 2020-04-26 DIAGNOSIS — Z9221 Personal history of antineoplastic chemotherapy: Secondary | ICD-10-CM | POA: Diagnosis not present

## 2020-04-26 DIAGNOSIS — C8338 Diffuse large B-cell lymphoma, lymph nodes of multiple sites: Secondary | ICD-10-CM | POA: Insufficient documentation

## 2020-04-26 DIAGNOSIS — C8513 Unspecified B-cell lymphoma, intra-abdominal lymph nodes: Secondary | ICD-10-CM | POA: Diagnosis not present

## 2020-04-26 DIAGNOSIS — Z7901 Long term (current) use of anticoagulants: Secondary | ICD-10-CM | POA: Diagnosis not present

## 2020-04-26 DIAGNOSIS — Z86711 Personal history of pulmonary embolism: Secondary | ICD-10-CM | POA: Diagnosis not present

## 2020-04-26 DIAGNOSIS — E538 Deficiency of other specified B group vitamins: Secondary | ICD-10-CM | POA: Diagnosis not present

## 2020-04-26 DIAGNOSIS — I10 Essential (primary) hypertension: Secondary | ICD-10-CM | POA: Diagnosis not present

## 2020-04-26 DIAGNOSIS — E1142 Type 2 diabetes mellitus with diabetic polyneuropathy: Secondary | ICD-10-CM | POA: Insufficient documentation

## 2020-04-26 DIAGNOSIS — E785 Hyperlipidemia, unspecified: Secondary | ICD-10-CM | POA: Diagnosis not present

## 2020-04-26 DIAGNOSIS — E119 Type 2 diabetes mellitus without complications: Secondary | ICD-10-CM | POA: Insufficient documentation

## 2020-04-26 DIAGNOSIS — E1140 Type 2 diabetes mellitus with diabetic neuropathy, unspecified: Secondary | ICD-10-CM | POA: Diagnosis not present

## 2020-04-26 DIAGNOSIS — K219 Gastro-esophageal reflux disease without esophagitis: Secondary | ICD-10-CM | POA: Insufficient documentation

## 2020-04-26 LAB — CBC WITH DIFFERENTIAL/PLATELET
Abs Immature Granulocytes: 0.01 10*3/uL (ref 0.00–0.07)
Basophils Absolute: 0.1 10*3/uL (ref 0.0–0.1)
Basophils Relative: 1 %
Eosinophils Absolute: 0.2 10*3/uL (ref 0.0–0.5)
Eosinophils Relative: 5 %
HCT: 35 % — ABNORMAL LOW (ref 39.0–52.0)
Hemoglobin: 11 g/dL — ABNORMAL LOW (ref 13.0–17.0)
Immature Granulocytes: 0 %
Lymphocytes Relative: 28 %
Lymphs Abs: 1.5 10*3/uL (ref 0.7–4.0)
MCH: 32.5 pg (ref 26.0–34.0)
MCHC: 31.4 g/dL (ref 30.0–36.0)
MCV: 103.6 fL — ABNORMAL HIGH (ref 80.0–100.0)
Monocytes Absolute: 0.5 10*3/uL (ref 0.1–1.0)
Monocytes Relative: 10 %
Neutro Abs: 2.9 10*3/uL (ref 1.7–7.7)
Neutrophils Relative %: 56 %
Platelets: 309 10*3/uL (ref 150–400)
RBC: 3.38 MIL/uL — ABNORMAL LOW (ref 4.22–5.81)
RDW: 12.6 % (ref 11.5–15.5)
WBC: 5.2 10*3/uL (ref 4.0–10.5)
nRBC: 0 % (ref 0.0–0.2)

## 2020-04-26 LAB — COMPREHENSIVE METABOLIC PANEL
ALT: 30 U/L (ref 0–44)
AST: 27 U/L (ref 15–41)
Albumin: 3.7 g/dL (ref 3.5–5.0)
Alkaline Phosphatase: 98 U/L (ref 38–126)
Anion gap: 9 (ref 5–15)
BUN: 39 mg/dL — ABNORMAL HIGH (ref 8–23)
CO2: 27 mmol/L (ref 22–32)
Calcium: 10.1 mg/dL (ref 8.9–10.3)
Chloride: 103 mmol/L (ref 98–111)
Creatinine, Ser: 1.94 mg/dL — ABNORMAL HIGH (ref 0.61–1.24)
GFR calc Af Amer: 38 mL/min — ABNORMAL LOW (ref >60)
GFR calc non Af Amer: 33 mL/min — ABNORMAL LOW (ref >60)
Glucose, Bld: 165 mg/dL — ABNORMAL HIGH (ref 70–99)
Potassium: 4.8 mmol/L (ref 3.5–5.1)
Sodium: 139 mmol/L (ref 135–145)
Total Bilirubin: 0.3 mg/dL (ref 0.3–1.2)
Total Protein: 7.4 g/dL (ref 6.5–8.1)

## 2020-04-26 LAB — LACTATE DEHYDROGENASE: LDH: 120 U/L (ref 98–192)

## 2020-04-26 MED ORDER — FLUDEOXYGLUCOSE F - 18 (FDG) INJECTION
10.4300 | Freq: Once | INTRAVENOUS | Status: AC | PRN
  Administered 2020-04-26: 10.43 via INTRAVENOUS

## 2020-05-03 ENCOUNTER — Inpatient Hospital Stay (HOSPITAL_BASED_OUTPATIENT_CLINIC_OR_DEPARTMENT_OTHER): Payer: Medicare Other | Admitting: Oncology

## 2020-05-03 VITALS — BP 136/69 | HR 56 | Temp 98.2°F | Resp 18 | Wt 180.2 lb

## 2020-05-03 DIAGNOSIS — I2699 Other pulmonary embolism without acute cor pulmonale: Secondary | ICD-10-CM

## 2020-05-03 DIAGNOSIS — D638 Anemia in other chronic diseases classified elsewhere: Secondary | ICD-10-CM

## 2020-05-03 DIAGNOSIS — E1142 Type 2 diabetes mellitus with diabetic polyneuropathy: Secondary | ICD-10-CM | POA: Diagnosis not present

## 2020-05-03 DIAGNOSIS — C8338 Diffuse large B-cell lymphoma, lymph nodes of multiple sites: Secondary | ICD-10-CM | POA: Diagnosis not present

## 2020-05-03 DIAGNOSIS — Z95828 Presence of other vascular implants and grafts: Secondary | ICD-10-CM

## 2020-05-03 DIAGNOSIS — C8336 Diffuse large B-cell lymphoma, intrapelvic lymph nodes: Secondary | ICD-10-CM

## 2020-05-03 DIAGNOSIS — E538 Deficiency of other specified B group vitamins: Secondary | ICD-10-CM

## 2020-05-03 DIAGNOSIS — E1159 Type 2 diabetes mellitus with other circulatory complications: Secondary | ICD-10-CM

## 2020-05-03 DIAGNOSIS — I10 Essential (primary) hypertension: Secondary | ICD-10-CM

## 2020-05-03 DIAGNOSIS — I152 Hypertension secondary to endocrine disorders: Secondary | ICD-10-CM

## 2020-05-03 NOTE — Progress Notes (Signed)
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 -We reviewed his labs.  LDH is 111. -PET scan on 03/01/2020 reviewed by me showed single modular thickening in the deep left pelvis with metabolic activity slightly above the background liver, Deauville 4.  This is decreased from comparison PET scan on 12/07/2018.  No additional hypermetabolic activities in the chest, abdomen or pelvis.  No new disease. -PET scan on 04/27/2020 demonstrates a deep, left pelvic hypermetabolic mass measuring 2.7 cm in size  CURRENT STATUS: Daniel Richards 74 y.o. male returns for followup of diffuse large B-cell lymphoma having completed all therapy and undergoing surveillance imaging.  He is here today to review most recent PET scan result.  He reports that he is doing well and recovering nicely from chemotherapy.  He denies any new lumps or bumps on his own examination.  He continues to have low back pain which is chronic but significantly interfering with his quality of life.  He denies any new B symptoms including fevers, chills, night sweats, early satiety, and unintentional weight loss.  No recent infections requiring antibiotic therapy or hospitalizations.  Review of Systems  Constitutional: Positive for malaise/fatigue. Negative for chills, fever and weight loss.  HENT: Negative.   Eyes: Negative.   Respiratory: Negative.  Negative for cough.   Cardiovascular: Negative.  Negative for chest pain.   Gastrointestinal: Negative.  Negative for blood in stool, constipation, diarrhea, melena, nausea and vomiting.  Genitourinary: Negative.   Musculoskeletal: Negative.   Skin: Negative.   Neurological: Positive for tremors. Negative for weakness.  Endo/Heme/Allergies: Negative.   Psychiatric/Behavioral: Negative.     Past Medical History:  Diagnosis Date  . Acid reflux   . Anxiety and depression   . Cancer ([place])    lymphoma  . Depression   . Diabetes ([place])   . Diabetes mellitus 06/09/2011   pt taking metformin  . DJD of shoulder    right   . Hyperlipidemia   . Hypertension   . Lymphoma ([place])   . Migraines   . Port-A-Cath in place 08/13/2019  . Pulmonary embolism ([place])   . Stomach ulcer   . Tremor   . Tremor   . Vitamin D deficiency      PHYSICAL EXAMINATION  ECOG PERFORMANCE STATUS: 2 - Symptomatic, <50% confined to bed  Vitals:   05/03/20 1355  BP: 136/69  Pulse: (!) 56  Resp: 18  Temp: 98.2 F (36.8 C)  SpO2: 98%    GENERAL:alert, no distress, comfortable, cooperative, smiling and accompanied by daughter, in wheelchair, Foley catheter in place. SKIN: skin color, texture, turgor are normal, no rashes or significant lesions HEAD: Normocephalic, No masses, lesions, tenderness or abnormalities EYES: normal, EOMI EARS: External ears normal OROPHARYNX: Not examined, mask in place NECK: supple, no adenopathy, thyroid normal size, non-tender, without nodularity LYMPH:  no palpable lymphadenopathy BREAST:not examined LUNGS: clear to auscultation  HEART: regular rate & rhythm ABDOMEN:normal bowel sounds BACK: Back symmetric, no curvature. EXTREMITIES:less then  2 second capillary refill, no joint deformities, effusion, or inflammation, no skin discoloration, no cyanosis  NEURO: alert & oriented x 3 with fluent speech, no focal motor/sensory deficits   LABORATORY DATA: CBC    Component Value Date/Time   WBC 5.2 04/26/2020 1330   RBC 3.38 (L) 04/26/2020 1330    HGB 11.0 (L) 04/26/2020 1330   HCT 35.0 (L) 04/26/2020 1330   PLT 309 04/26/2020 1330   MCV 103.6 (H) 04/26/2020 1330   MCH 32.5 04/26/2020 1330   MCHC 31.4 04/26/2020 1330   RDW 12.6 04/26/2020 1330   LYMPHSABS 1.5 04/26/2020 1330   MONOABS 0.5 04/26/2020 1330   EOSABS 0.2 04/26/2020 1330   BASOSABS 0.1 04/26/2020 1330      Chemistry      Component Value Date/Time   NA 139 04/26/2020 1330   NA 141 09/01/2015 1138   K 4.8 04/26/2020 1330   CL 103 04/26/2020 1330   CO2 27 04/26/2020 1330   BUN 39 (H) 04/26/2020 1330   BUN 16 09/01/2015 1138   CREATININE 1.94 (H) 04/26/2020 1330      Component Value Date/Time   CALCIUM 10.1 04/26/2020 1330   CALCIUM 10.1 07/29/2019 1244   ALKPHOS 98 04/26/2020 1330   AST 27 04/26/2020 1330   ALT 30 04/26/2020 1330   BILITOT 0.3 04/26/2020 1330       RADIOGRAPHIC STUDIES:  NM PET Image Restag (PS) Skull Base To Thigh  Result Date: 04/27/2020 CLINICAL DATA:  Subsequent treatment strategy for B-cell lymphoma. EXAM: NUCLEAR MEDICINE PET SKULL BASE TO THIGH TECHNIQUE: 10.4 mCi F-18 FDG was injected intravenously. Full-ring PET imaging was performed from the skull base to thigh after the radiotracer. CT data was obtained and used for attenuation correction and anatomic localization. Fasting blood glucose: 114 mg/dl COMPARISON:  PET-CT scan 03/01/2020 FINDINGS: Mediastinal blood pool activity: SUV max 1.4 Liver activity: SUV max 2.5 NECK: No hypermetabolic lymph nodes in the neck. Incidental CT findings: none CHEST: No hypermetabolic mediastinal or hilar nodes. No suspicious pulmonary nodules on the CT scan. Incidental CT findings: Port in the anterior chest wall with tip in distal SVC. ABDOMEN/PELVIS: Interval enlargement hypermetabolic nodule in the deep LEFT pelvis measuring 27 mm on image 266. This nodule is intensely hypermetabolic with SUV max equal 10.4 and is increased in size and metabolic activity from comparison PET-CT scan (previously  measuring 9 mm on 03/01/2020 with SUV max equal 4.5). No additional hypermetabolic nodules or lymph nodes in the abdomen pelvis. No abnormal activity liver. The spleen is normal size and normal volume. Incidental CT findings: Foley catheter in bladder SKELETON: No focal hypermetabolic activity to suggest skeletal metastasis. Incidental CT findings: none IMPRESSION: Enlarging intensely hypermetabolic nodule in the deep LEFT pelvis is most concerning for lymphoma recurrence ( Deauville 5). No additional evidence of adenopathy on whole-body PET scan. Normal spleen and bone marrow. Electronically Signed   By: Suzy Bouchard M.D.   On: 04/27/2020 09:59       ASSESSMENT AND PLAN:  1. 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 -We reviewed his labs.  LDH is 111. -PET scan on 03/01/2020 reviewed by me showed single modular thickening in the deep left pelvis with metabolic activity slightly above the background liver, Deauville 4.  This is decreased from comparison PET scan on 12/07/2018.  No additional hypermetabolic activities in the chest, abdomen or pelvis.  No new disease. -  He is gradually recovering from previous chemotherapy.  I have recommended a follow-up PET CT scan in 2 months.  Labs completed on 04/26/2020 reviewed: WBC 5.2 with normal differential, hemoglobin 9.0 g/dL, and platelet count 309,000.  Metabolic panel demonstrates worsening renal disease with a creatinine of 1.9 and BUN 39 with a BUN: Creatinine ratio > 20-suspicious for dehydration.  Calcium upper limits of normal at 10.1 with normal albumin.  LDH within normal limits 120.  I personally reviewed and went over radiographic studies with the patient.  The results are noted within this dictation.  I personally reviewed the images in PACS.  PET scan on 04/26/2020 demonstrated an enlarging intensely hypermetabolic nodule in the deep left pelvis,  most concerning for lymphoma recurrence.  He was not felt to be a candidate for full-dose R-CHOP therapy and I agree with that decision.  He therefore received R-mini-CHOP.  Now with this new finding concerning for recurrence of disease DEEP in the PELVIS on LEFT.  I will discuss case with surgeon and see if biopsy is possible.  IR may be another option but not ideal.  We will need multiple passes of core biopsies for testing/diagnosis.  If not, we may need to assume recurrence of DLBCL and pursue salvage XRT to that area only if radiation oncology is willing to treat without biopsy.  I've discussed the case with Dr. Arnoldo Morale, and he thinks that IR would be the best way to approach biopsy.  Referral placed to IR for biopsy, multiple core biopsies necessary if biopsy is technically feasible.  Additionally, I have referred him to XRT given that he has single site of disease, completed R-mini-CHOP, for consideration of XRT with or without biopsy.  Return in 2 weeks for follow-up and review pathology results.  At that time, we will develop medical oncology plan moving forward.  2. Port-A-Cath in place Placed by Dr. Arnoldo Morale.  Will flush every 6-8 weeks.  3. PE (pulmonary thromboembolism) (HCC) On Eliquis anticoagulation   4. Type 2 diabetes, controlled, with peripheral neuropathy ([place]) On metformin therapy  5. Hypertension associated with type 2 diabetes mellitus ([place]) Well-controlled today  6. Folate deficiency On Folvite oral therapy  7. B12 deficiency Stable  8. Anemia of chronic disease Hemoglobin improved.   ORDERS PLACED FOR THIS ENCOUNTER: Orders Placed This Encounter  Procedures  . CT Biopsy    MEDICATIONS PRESCRIBED THIS ENCOUNTER: No orders of the defined types were placed in this encounter.   All questions were answered. The patient knows to call the clinic with any problems, questions or concerns. We can certainly see the patient much sooner if necessary.  Patient  and plan discussed with Dr. Derek Jack and he is in agreement with the aforementioned.   This note is electronically signed by: Robynn Pane, PA-C 05/03/2020 4:06 PM

## 2020-05-03 NOTE — Patient Instructions (Signed)
[place] at [facility] Discharge Instructions  You were seen today by Kirby Crigler PA. He went over your recent scan and lab results. He will schedule you for a biopsy soon. He will also refer you to Radiation for possible treatment. He will see you back in 2 weeks for follow up.   Thank you for choosing [place] at [facility] to provide your oncology and hematology care.  To afford each patient quality time with our provider, please arrive at least 15 minutes before your scheduled appointment time.   If you have a lab appointment with the [place] please come in thru the  Main Entrance and check in at the main information desk  You need to re-schedule your appointment should you arrive 10 or more minutes late.  We strive to give you quality time with our providers, and arriving late affects you and other patients whose appointments are after yours.  Also, if you no show three or more times for appointments you may be dismissed from the clinic at the providers discretion.     Again, thank you for choosing [facility].  Our hope is that these requests will decrease the amount of time that you wait before being seen by our physicians.       _____________________________________________________________  Should you have questions after your visit to [facility], please contact our office at [phone] between the hours of 8:00 a.m. and 4:30 p.m.  Voicemails left after 4:00 p.m. will not be returned until the following business day.  For prescription refill requests, have your pharmacy contact our office and allow 72 hours.    Cancer Center Support Programs:   > Cancer Support Group  2nd Tuesday of the month 1pm-2pm, Journey Room

## 2020-05-04 ENCOUNTER — Encounter (HOSPITAL_COMMUNITY): Payer: Self-pay | Admitting: *Deleted

## 2020-05-04 ENCOUNTER — Encounter (HOSPITAL_COMMUNITY): Payer: Self-pay | Admitting: Radiology

## 2020-05-04 NOTE — Progress Notes (Signed)
Phyllis Ginger Speros Male, 74 y.o., 1946-08-22 MRN:  [id number] Phone:  [phone] Jerilynn Mages) PCP:  [name], FNP Coverage:  Medicare/Medicare Part A And B Next Appt With Oncology 05/17/2020 at 4:15 PM  RE: STAT  CT Biopsy Received: Today Markus Daft, MD  Arlyn Leak for CT guided biopsy of left pelvic lesion. Left transgluteal approach.   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

## 2020-05-04 NOTE — Progress Notes (Signed)
I spoke with Kirby Crigler, PA and he is okay with patient holding Eliquis prior to his biopsy.  We have notified central scheduling, Tasha, and they will contact patient with appt.

## 2020-05-05 ENCOUNTER — Telehealth: Payer: Self-pay | Admitting: *Deleted

## 2020-05-05 NOTE — Telephone Encounter (Signed)
LVM for call back to schedule appointment w/ Dr. Isidore Moos.

## 2020-05-11 ENCOUNTER — Other Ambulatory Visit: Payer: Self-pay | Admitting: Physician Assistant

## 2020-05-12 ENCOUNTER — Other Ambulatory Visit: Payer: Self-pay

## 2020-05-12 ENCOUNTER — Ambulatory Visit (HOSPITAL_COMMUNITY)
Admission: RE | Admit: 2020-05-12 | Discharge: 2020-05-12 | Disposition: A | Discharge status: Home / Self Care | Payer: Medicare Other | Source: Ambulatory Visit | Attending: Hematology | Admitting: Hematology

## 2020-05-12 ENCOUNTER — Encounter (HOSPITAL_COMMUNITY): Payer: Self-pay

## 2020-05-12 ENCOUNTER — Observation Stay (HOSPITAL_COMMUNITY)
Admission: RE | Admit: 2020-05-12 | Discharge: 2020-05-12 | Disposition: A | Discharge status: Home / Self Care | Payer: Medicare Other | Source: Ambulatory Visit | Attending: Hematology | Admitting: Hematology

## 2020-05-12 DIAGNOSIS — F329 Major depressive disorder, single episode, unspecified: Secondary | ICD-10-CM | POA: Diagnosis not present

## 2020-05-12 DIAGNOSIS — Z7901 Long term (current) use of anticoagulants: Secondary | ICD-10-CM | POA: Insufficient documentation

## 2020-05-12 DIAGNOSIS — C8336 Diffuse large B-cell lymphoma, intrapelvic lymph nodes: Secondary | ICD-10-CM | POA: Insufficient documentation

## 2020-05-12 DIAGNOSIS — Z86711 Personal history of pulmonary embolism: Secondary | ICD-10-CM | POA: Diagnosis not present

## 2020-05-12 DIAGNOSIS — G43909 Migraine, unspecified, not intractable, without status migrainosus: Secondary | ICD-10-CM | POA: Insufficient documentation

## 2020-05-12 DIAGNOSIS — Z7984 Long term (current) use of oral hypoglycemic drugs: Secondary | ICD-10-CM | POA: Insufficient documentation

## 2020-05-12 DIAGNOSIS — K219 Gastro-esophageal reflux disease without esophagitis: Secondary | ICD-10-CM | POA: Diagnosis not present

## 2020-05-12 DIAGNOSIS — R590 Localized enlarged lymph nodes: Secondary | ICD-10-CM | POA: Diagnosis present

## 2020-05-12 DIAGNOSIS — E119 Type 2 diabetes mellitus without complications: Secondary | ICD-10-CM | POA: Diagnosis not present

## 2020-05-12 DIAGNOSIS — M19011 Primary osteoarthritis, right shoulder: Secondary | ICD-10-CM | POA: Diagnosis not present

## 2020-05-12 DIAGNOSIS — I10 Essential (primary) hypertension: Secondary | ICD-10-CM | POA: Insufficient documentation

## 2020-05-12 DIAGNOSIS — Z881 Allergy status to other antibiotic agents status: Secondary | ICD-10-CM | POA: Diagnosis not present

## 2020-05-12 DIAGNOSIS — Z79899 Other long term (current) drug therapy: Secondary | ICD-10-CM | POA: Diagnosis not present

## 2020-05-12 LAB — PROTIME-INR
INR: 1.2 (ref 0.8–1.2)
Prothrombin Time: 14.3 seconds (ref 11.4–15.2)

## 2020-05-12 LAB — GLUCOSE, CAPILLARY: Glucose-Capillary: 122 mg/dL — ABNORMAL HIGH (ref 70–99)

## 2020-05-12 MED ORDER — LIDOCAINE HCL (PF) 1 % IJ SOLN
INTRAMUSCULAR | Status: AC | PRN
  Administered 2020-05-12: 10 mL

## 2020-05-12 MED ORDER — FENTANYL CITRATE (PF) 100 MCG/2ML IJ SOLN
INTRAMUSCULAR | Status: AC | PRN
  Administered 2020-05-12: 50 ug via INTRAVENOUS

## 2020-05-12 MED ORDER — HYDROCODONE-ACETAMINOPHEN 5-325 MG PO TABS: 1.0000 | ORAL_TABLET | ORAL | Status: DC | PRN

## 2020-05-12 MED ORDER — SODIUM CHLORIDE 0.9 % IV SOLN: INTRAVENOUS | Status: DC

## 2020-05-12 MED ORDER — MIDAZOLAM HCL 2 MG/2ML IJ SOLN
INTRAMUSCULAR | Status: AC
  Filled 2020-05-12: qty 4

## 2020-05-12 MED ORDER — HEPARIN SOD (PORK) LOCK FLUSH 100 UNIT/ML IV SOLN
500.0000 [IU] | INTRAVENOUS | Status: DC | PRN
  Filled 2020-05-12: qty 5

## 2020-05-12 MED ORDER — MIDAZOLAM HCL 2 MG/2ML IJ SOLN
INTRAMUSCULAR | Status: AC | PRN
  Administered 2020-05-12 (×2): 1 mg via INTRAVENOUS

## 2020-05-12 MED ORDER — FENTANYL CITRATE (PF) 100 MCG/2ML IJ SOLN
INTRAMUSCULAR | Status: AC
  Filled 2020-05-12: qty 2

## 2020-05-12 NOTE — Discharge Instructions (Signed)
Please call Interventional Radiology clinic [phone] with any questions or concerns.  You may remove your dressing and shower tomorrow.   Needle Biopsy, Care After These instructions tell you how to care for yourself after your procedure. Your doctor may also give you more specific instructions. Call your doctor if you have any problems or questions. What can I expect after the procedure? After the procedure, it is common to have:  Soreness.  Bruising.  Mild pain. Follow these instructions at home:   Return to your normal activities as told by your doctor. Ask your doctor what activities are safe for you.  Take over-the-counter and prescription medicines only as told by your doctor.  Wash your hands with soap and water before you change your bandage (dressing). If you cannot use soap and water, use hand sanitizer.  Follow instructions from your doctor about: ? How to take care of your puncture site. ? When and how to change your bandage. ? When to remove your bandage.  Check your puncture site every day for signs of infection. Watch for: ? Redness, swelling, or pain. ? Fluid or blood. ? Pus or a bad smell. ? Warmth.  Do not take baths, swim, or use a hot tub until your doctor approves. Ask your doctor if you may take showers. You may only be allowed to take sponge baths.  Keep all follow-up visits as told by your doctor. This is important. Contact a doctor if you have:  A fever.  Redness, swelling, or pain at the puncture site, and it lasts longer than a few days.  Fluid, blood, or pus coming from the puncture site.  Warmth coming from the puncture site. Get help right away if:  You have a lot of bleeding from the puncture site. Summary  After the procedure, it is common to have soreness, bruising, or mild pain at the puncture site.  Check your puncture site every day for signs of infection, such as redness, swelling, or pain.  Get help right away if you  have severe bleeding from your puncture site. This information is not intended to replace advice given to you by your health care provider. Make sure you discuss any questions you have with your health care provider. Document Revised: 11/05/2017 Document Reviewed: 11/05/2017 Elsevier Patient Education  2020 Elsevier Inc.   Moderate Conscious Sedation, Adult, Care After These instructions provide you with information about caring for yourself after your procedure. Your health care provider may also give you more specific instructions. Your treatment has been planned according to current medical practices, but problems sometimes occur. Call your health care provider if you have any problems or questions after your procedure. What can I expect after the procedure? After your procedure, it is common:  To feel sleepy for several hours.  To feel clumsy and have poor balance for several hours.  To have poor judgment for several hours.  To vomit if you eat too soon. Follow these instructions at home: For at least 24 hours after the procedure:   Do not: ? Participate in activities where you could fall or become injured. ? Drive. ? Use heavy machinery. ? Drink alcohol. ? Take sleeping pills or medicines that cause drowsiness. ? Make important decisions or sign legal documents. ? Take care of children on your own.  Rest. Eating and drinking  Follow the diet recommended by your health care provider.  If you vomit: ? Drink water, juice, or soup when you can drink without vomiting. ?   Make sure you have little or no nausea before eating solid foods. General instructions  Have a responsible adult stay with you until you are awake and alert.  Take over-the-counter and prescription medicines only as told by your health care provider.  If you smoke, do not smoke without supervision.  Keep all follow-up visits as told by your health care provider. This is important. Contact a health care  provider if:  You keep feeling nauseous or you keep vomiting.  You feel light-headed.  You develop a rash.  You have a fever. Get help right away if:  You have trouble breathing. This information is not intended to replace advice given to you by your health care provider. Make sure you discuss any questions you have with your health care provider. Document Revised: 10/05/2017 Document Reviewed: 02/12/2016 Elsevier Patient Education  2020 Elsevier Inc.   

## 2020-05-12 NOTE — H&P (Signed)
Referring Physician(s): Katragadda,Sreedhar  Supervising Physician: Arne Cleveland  Patient Status:  [facility] OP  Chief Complaint:  "I'm having a biopsy"  Subjective: Patient familiar to IR service from retroperitoneal lymph node biopsy in 2020.  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  He presents today for left pelvic nodule biopsy.  He currently denies fever, headache, chest pain, dyspnea, cough, abdominal pain, nausea, vomiting or bleeding.  He does have some chronic back pain.  Additional medical history as below.  Past Medical History:  Diagnosis Date  . Acid reflux   . Anxiety and depression   . Cancer ([place])    lymphoma  . Depression   . Diabetes ([place])   . Diabetes mellitus 06/09/2011   pt taking metformin  . DJD of shoulder    right   . Hyperlipidemia   . Hypertension   . Lymphoma ([place])   . Migraines   . Port-A-Cath in place 08/13/2019  . Pulmonary embolism ([place])   . Stomach ulcer   . Tremor   . Tremor   . Vitamin D deficiency    Past Surgical History:  Procedure Laterality Date  . BACK SURGERY    . KNEE SURGERY    . PORTACATH PLACEMENT Left 08/08/2019   Procedure: INSERTION PORT-A-CATH;  Surgeon: Aviva Signs, MD;  Location: [facility];  Service: General;  Laterality: Left;  . SHOULDER SURGERY        Allergies: Vancomycin  Medications: Prior to Admission medications   Medication Sig Start Date End Date Taking? Authorizing Provider  allopurinol (ZYLOPRIM) 300 MG tablet Take 1 tablet (300 mg total) by mouth daily. 09/15/19  Yes Derek Jack, MD  calcitRIOL (ROCALTROL) 0.5 MCG capsule Take 1 capsule (0.5 mcg total) by mouth daily. 08/06/19  Yes Gerlene Fee, NP  citalopram (CELEXA) 20 MG tablet Take 1 tablet (20 mg total) by mouth at bedtime. Patient taking differently: Take 40 mg by mouth at  bedtime.  08/06/19  Yes Gerlene Fee, NP  cyclobenzaprine (FLEXERIL) 10 MG tablet Take 10 mg by mouth at bedtime. 03/23/20  Yes [provider]  gabapentin (NEURONTIN) 300 MG capsule Take 2 capsules (600 mg total) by mouth 3 (three) times daily. 08/06/19  Yes Gerlene Fee, NP  metFORMIN (GLUCOPHAGE) 500 MG tablet Take 1 tablet (500 mg total) by mouth daily. 08/06/19  Yes Gerlene Fee, NP  pantoprazole (PROTONIX) 40 MG tablet Take 1 tablet (40 mg total) by mouth 2 (two) times daily before a meal. 08/06/19  Yes Gerlene Fee, NP  primidone (MYSOLINE) 50 MG tablet Take 1.5 tablets (75 mg total) by mouth every 8 (eight) hours. Patient taking differently: Take 100 mg by mouth 2 (two) times daily.  08/06/19  Yes Gerlene Fee, NP  propranolol (INDERAL) 40 MG tablet Take 1 tablet (40 mg total) by mouth 2 (two) times daily. 08/06/19  Yes Gerlene Fee, NP  topiramate (TOPAMAX) 100 MG tablet Take 2 tablets (200 mg total) by mouth at bedtime. 08/06/19  Yes Gerlene Fee, NP  CYCLOPHOSPHAMIDE IV Inject into the vein every 21 ( twenty-one) days. Patient not taking: Reported on 05/03/2020 08/20/19   [provider]  DOXORUBICIN HCL IV Inject into the vein every 21 ( twenty-one) days. Patient not taking: Reported on 05/03/2020 08/20/19   [provider]  ELIQUIS 5 MG TABS  tablet Take 5 mg by mouth 2 (two) times daily. 12/23/19   [provider]  folic acid (FOLVITE) 1 MG tablet Take 1 tablet (1 mg total) by mouth daily. Patient not taking: Reported on 05/03/2020 08/06/19   Gerlene Fee, NP  GAVILAX 17 GM/SCOOP powder Take 17 g by mouth daily as needed for mild constipation or moderate constipation.  Patient not taking: Reported on 05/03/2020 08/18/19   [provider]  HYDROcodone-acetaminophen (NORCO) 10-325 MG tablet Take 1 tablet by mouth every 6 (six) hours as needed for moderate pain or severe pain.  Patient not taking: Reported on 05/03/2020 12/08/19    [provider]  riTUXimab in sodium chloride 0.9 % 250 mL Inject into the vein every 21 ( twenty-one) days. Patient not taking: Reported on 05/03/2020 08/20/19   [provider]  temazepam (RESTORIL) 15 MG capsule Take 1 capsule (15 mg total) by mouth at bedtime. 10/07/19   Barton Dubois, MD  vinCRIStine 2 mg in sodium chloride 0.9 % 50 mL Inject 2 mg into the vein every 21 ( twenty-one) days. Patient not taking: Reported on 05/03/2020 08/20/19   [provider]     Vital Signs: BP (!) 124/58 (BP Location: Right Arm)   Pulse (!) 56   Temp 98.6 F (37 C) (Oral)   Resp 18   SpO2 100%   Physical Exam awake, alert.  Chest clear to auscultation bilaterally.  Left chest wall Port-A-Cath in place.  Heart with regular rate and rhythm.  Abdomen soft, positive bowel sounds, nontender.  No significant lower extremity edema.  Imaging: No results found.  Labs:  CBC: Recent Labs    01/21/20 0755 02/11/20 1037 03/04/20 1145 04/26/20 1330  WBC 12.6* 7.6 5.1 5.2  HGB 9.6* 8.8* 9.2* 11.0*  HCT 30.8* 28.7* 29.5* 35.0*  PLT 376 292 248 309    COAGS: Recent Labs    07/29/19 0630 08/26/19 1330 08/27/19 1059 08/29/19 0438 08/30/19 0634 08/31/19 0628 01/15/20 1809  INR 1.2 1.4*  --   --   --   --  1.7*  APTT  --  29   < > 67* 53* 37* 41*   < > = values in this interval not displayed.    BMP: Recent Labs    01/21/20 0755 02/11/20 1037 03/04/20 1145 04/26/20 1330  NA 140 138 136 139  K 4.3 3.9 4.1 4.8  CL 106 107 100 103  CO2 24 22 27 27   GLUCOSE 147* 210* 127* 165*  BUN 20 22 25* 39*  CALCIUM 9.3 9.1 9.1 10.1  CREATININE 1.31* 1.42* 1.59* 1.94*  GFRNONAA 53* 48* 42* 33*  GFRAA >60 56* 48* 38*    LIVER FUNCTION TESTS: Recent Labs    01/21/20 0755 02/11/20 1037 03/04/20 1145 04/26/20 1330  BILITOT 0.2* 0.3 0.5 0.3  AST 16 15 16 27   ALT 12 13 13 30   ALKPHOS 81 73 78 98  PROT 7.0 6.6 6.9 7.4  ALBUMIN 3.3* 3.2* 3.6 3.7    Assessment  and Plan: Patient with history of diffuse large B-cell lymphoma, prior PE, diabetes, hypertension, vitamin D deficiency, anemia and recent PET scan which revealed enlarging intensely hypermetabolic nodule in the deep left pelvis concerning for lymphoma recurrence.  He presents today for image guided left pelvic nodule biopsy.Risks and benefits of procedure was discussed with the patient  including, but not limited to bleeding, infection, damage to adjacent structures or low yield requiring additional tests.  All of the  questions were answered and there is agreement to proceed.  Consent signed and in chart.     Electronically Signed: D. Rowe Robert, PA-C 05/12/2020, 9:56 AM   I spent a total of 25 minutes at the the patient's bedside AND on the patient's hospital floor or unit, greater than 50% of which was counseling/coordinating care for image guided left pelvic nodule biopsy

## 2020-05-12 NOTE — Procedures (Signed)
  Procedure: CT core L int iliac LAN   EBL:   minimal Complications:  none immediate  See full dictation in [facility].  Dillard Cannon MD Main # [phone] Pager  [phone]

## 2020-05-12 NOTE — Progress Notes (Signed)
Lymphoma Location(s) / Histology:  Diffuse large B-cell lymphoma of LEFT intrapelvic lymph nodes   Daniel Richards was hospitalized back in September 2020 after a fall and progressive weakness. He had a CT of abdomen/pelvis on 07/24/2019 which showed bulky retroperitoneal adenopathy extending from the level of the adrenal glands into the pelvis. He received 6 cycles of R-CHOP (had to be hospitalized after first cycle due to fever, UTI, and bilateral pulmonary embolism).  Restaging PET scan on 04/26/2020 showed enlarging intensely hypermetabolic nodule in the deep LEFT pelvis is most concerning for lymphoma recurrence  Biopsies revealed: (scheduled for an CT biopsy in IR on 05/12/2020)  07/30/2019 DIAGNOSIS:  A. LYMPH NODE, LEFT RETROPERITONEAL, NEEDLE CORE BIOPSY:  - High grade B-cell lymphoma  - See comment  COMMENT:  The sample consists of multiple fragmented core biopsies. The lymphoid proliferation consists of a relatively monotonous population with numerous mitoses and apoptotic debris. By immunohistochemistry, this lymphoid population is positive for CD20, CD5 (dim), BCL-2 and BCL 6 but  negative for CD10, CD3, and EBV by in situ hybridization. The proliferative rate by Ki-67 is elevated 40 to 50%. Overall, these features are consistent with a high-grade B-cell lymphoma. 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  Past/Anticipated interventions by medical oncology, if any:  Under care of Dr. Sreedhar Katragadda -Received 6 Cycles of R mini CHOP from 08/21/2019 through 01/21/2020 -I will discuss case with surgeon and see if biopsy is possible.  IR may be another option but not ideal. We will need multiple passes of core biopsies for testing/diagnosis -Return in 2 weeks for follow-up and review pathology results.  At that time, we will develop medical oncology plan moving forward.  Weight changes, if any,  over the past 6 months: Patient lost weight during chemotherapy treatment, but per daughter he has slowly regained some of the weight he lost in the past 3 months  Recurrent fevers, or drenching night sweats, if any:  Patient denies  SAFETY ISSUES:  Prior radiation? No  Pacemaker/ICD? No  Possible current pregnancy? N/A  Is the patient on methotrexate? No  Current Complaints / other details: Had CT guided biopsy in IR on 05/12/2020. On Eliquis for pulmonary embolis. Chronic lower back pain      

## 2020-05-13 NOTE — Progress Notes (Signed)
Radiation Oncology         [phone] ________________________________  Initial outpatient Consultation - the patient opted for telemedicine to maximize safety during the pandemic.  MyChart video was used.  Name: Daniel Richards MRN: [id number]  Date: 05/14/2020  DOB: 1945-12-01  VE:[id number], Anabel Bene, FNP  [facility] Cancer, PA-C   REFERRING PHYSICIAN: Baird Cancer, PA-C  DIAGNOSIS:    ICD-10-CM   1. B-cell lymphoma of intra-abdominal lymph nodes, unspecified B-cell lymphoma type ([place])  C85.13   2. 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 male who presented with bilateral leg weakness, which caused a fall. He was seen in the ED on 07/24/2019. Of the scans performed to evaluate for trauma, CT abdomen/pelvis revealed: bulky retroperitoneal and left pelvic lymphadenopathy, possibly occluding left common and/or external iliac vein. Biopsy of a left retroperitoneal lymph node on 07/30/2019 showed high-grade B-cell lymphoma.   He was referred to Dr. Delton Coombes, who recommended PET scan and chemotherapy. PET scan performed on 08/07/2019 showed: active lymphoma within nodal stations of neck, chest, abdomen, and pelvis; heterogeneous foci of marrow hypermetabolism; right sphenoid sinus soft tissue density and hypermetabolism.   The patient was treated with 6 cycles of R mini CHOP from 08/21/2019 through 01/21/2020. Post-treatment PET scan performed on 03/01/2020 showed: single modular thickening in deep left pelvis with metabolic activity slightly above background liver, decreased from prior PET in 12/2019; no additional hypermetabolic activities, and no new disease.  Most recent surveillance PET scan on 04/26/2020 showed: enlarging intensely hypermetabolic nodule in deep left  pelvis most concerning for lymphoma recurrence; no additional evidence of adenopathy, normal spleen and bone marrow.  Biopsy of the node was performed on 05/12/2020. The results are still pending.  He has severe back pain - lower left -- originated at diagnosis and was so bad that he was bedridden; this improved with chemotherapy, and now is getting worse.  Right now he can walk short distances with a cane.  He denies any fevers or drenching night sweats prior to diagnosis.  He denies significant weight loss before diagnosis.  He did lose some weight during chemotherapy but his daughter reports that he is slowly regaining that.  He denies any bowel symptoms.  He has a Foley catheter.    PREVIOUS RADIATION THERAPY: No  PAST MEDICAL HISTORY:  has a past medical history of Acid reflux, Anxiety and depression, Cancer ([place]), Depression, Diabetes ([place]), Diabetes mellitus (06/09/2011), DJD of shoulder, Hyperlipidemia, Hypertension, Lymphoma ([place]), Migraines, Port-A-Cath in place (08/13/2019), Pulmonary embolism ([place]), Stomach ulcer, Tremor, Tremor, and Vitamin D deficiency.    PAST SURGICAL HISTORY: Past Surgical History:  Procedure Laterality Date  . BACK SURGERY    . KNEE SURGERY    . PORTACATH PLACEMENT Left 08/08/2019   Procedure: INSERTION PORT-A-CATH;  Surgeon: Aviva Signs, MD;  Location: [facility];  Service: General;  Laterality: Left;  . SHOULDER SURGERY      FAMILY HISTORY: family history includes Cancer in his brother, brother, sister, and sister; Healthy in his daughter and son; Heart attack in his brother and father.  SOCIAL HISTORY:  reports that he quit smoking about 30 years ago. His smoking use included cigarettes. He has a 40.00 pack-year smoking history. He has never used smokeless tobacco. He reports  that he does not drink alcohol and does not use drugs.  ALLERGIES: Vancomycin  MEDICATIONS:  Current Outpatient Medications  Medication Sig Dispense Refill  . allopurinol  (ZYLOPRIM) 300 MG tablet Take 1 tablet (300 mg total) by mouth daily. 30 tablet 0  . calcitRIOL (ROCALTROL) 0.5 MCG capsule Take 1 capsule (0.5 mcg total) by mouth daily. 30 capsule 0  . citalopram (CELEXA) 20 MG tablet Take 1 tablet (20 mg total) by mouth at bedtime. (Patient taking differently: Take 40 mg by mouth at bedtime. ) 30 tablet 0  . cyclobenzaprine (FLEXERIL) 10 MG tablet Take 10 mg by mouth at bedtime.    Marland Kitchen ELIQUIS 5 MG TABS tablet Take 5 mg by mouth 2 (two) times daily.    Marland Kitchen gabapentin (NEURONTIN) 300 MG capsule Take 2 capsules (600 mg total) by mouth 3 (three) times daily. 90 capsule 0  . meclizine (ANTIVERT) 25 MG tablet Take 25 mg by mouth daily as needed.    . metFORMIN (GLUCOPHAGE) 500 MG tablet Take 1 tablet (500 mg total) by mouth daily. 30 tablet 0  . pantoprazole (PROTONIX) 40 MG tablet Take 1 tablet (40 mg total) by mouth 2 (two) times daily before a meal. 60 tablet 0  . primidone (MYSOLINE) 50 MG tablet Take 1.5 tablets (75 mg total) by mouth every 8 (eight) hours. (Patient taking differently: Take 100 mg by mouth 2 (two) times daily. ) 150 tablet 0  . propranolol (INDERAL) 40 MG tablet Take 1 tablet (40 mg total) by mouth 2 (two) times daily. 60 tablet 0  . temazepam (RESTORIL) 15 MG capsule Take 1 capsule (15 mg total) by mouth at bedtime. 15 capsule 0  . topiramate (TOPAMAX) 100 MG tablet Take 2 tablets (200 mg total) by mouth at bedtime. 60 tablet 0  . CYCLOPHOSPHAMIDE IV Inject into the vein every 21 ( twenty-one) days. (Patient not taking: Reported on 05/03/2020)    . DOXORUBICIN HCL IV Inject into the vein every 21 ( twenty-one) days. (Patient not taking: Reported on 05/03/2020)    . folic acid (FOLVITE) 1 MG tablet Take 1 tablet (1 mg total) by mouth daily. (Patient not taking: Reported on 05/03/2020) 30 tablet 0  . GAVILAX 17 GM/SCOOP powder Take 17 g by mouth daily as needed for mild constipation or moderate constipation.  (Patient not taking: Reported on 05/03/2020)      . HYDROcodone-acetaminophen (NORCO) 10-325 MG tablet Take 1 tablet by mouth every 6 (six) hours as needed for moderate pain or severe pain.  (Patient not taking: Reported on 05/03/2020)    . riTUXimab in sodium chloride 0.9 % 250 mL Inject into the vein every 21 ( twenty-one) days. (Patient not taking: Reported on 05/03/2020)    . vinCRIStine 2 mg in sodium chloride 0.9 % 50 mL Inject 2 mg into the vein every 21 ( twenty-one) days. (Patient not taking: Reported on 05/03/2020)     No current facility-administered medications for this encounter.    REVIEW OF SYSTEMS:  Notable for that above.   PHYSICAL EXAM:  vitals were not taken for this visit.   Resting tremor of head   LABORATORY DATA:  Lab Results  Component Value Date   WBC 5.2 04/26/2020   HGB 11.0 (L) 04/26/2020   HCT 35.0 (L) 04/26/2020   MCV 103.6 (H) 04/26/2020   PLT 309 04/26/2020   CMP     Component Value Date/Time   NA 139 04/26/2020 1330   NA 141 09/01/2015 1138  K 4.8 04/26/2020 1330   CL 103 04/26/2020 1330   CO2 27 04/26/2020 1330   GLUCOSE 165 (H) 04/26/2020 1330   BUN 39 (H) 04/26/2020 1330   BUN 16 09/01/2015 1138   CREATININE 1.94 (H) 04/26/2020 1330   CALCIUM 10.1 04/26/2020 1330   CALCIUM 10.1 07/29/2019 1244   PROT 7.4 04/26/2020 1330   ALBUMIN 3.7 04/26/2020 1330   AST 27 04/26/2020 1330   ALT 30 04/26/2020 1330   ALKPHOS 98 04/26/2020 1330   BILITOT 0.3 04/26/2020 1330   GFRNONAA 33 (L) 04/26/2020 1330   GFRAA 38 (L) 04/26/2020 1330         RADIOGRAPHY: NM PET Image Restag (PS) Skull Base To Thigh  Result Date: 04/27/2020 CLINICAL DATA:  Subsequent treatment strategy for B-cell lymphoma. EXAM: NUCLEAR MEDICINE PET SKULL BASE TO THIGH TECHNIQUE: 10.4 mCi F-18 FDG was injected intravenously. Full-ring PET imaging was performed from the skull base to thigh after the radiotracer. CT data was obtained and used for attenuation correction and anatomic localization. Fasting blood glucose: 114 mg/dl  COMPARISON:  PET-CT scan 03/01/2020 FINDINGS: Mediastinal blood pool activity: SUV max 1.4 Liver activity: SUV max 2.5 NECK: No hypermetabolic lymph nodes in the neck. Incidental CT findings: none CHEST: No hypermetabolic mediastinal or hilar nodes. No suspicious pulmonary nodules on the CT scan. Incidental CT findings: Port in the anterior chest wall with tip in distal SVC. ABDOMEN/PELVIS: Interval enlargement hypermetabolic nodule in the deep LEFT pelvis measuring 27 mm on image 266. This nodule is intensely hypermetabolic with SUV max equal 10.4 and is increased in size and metabolic activity from comparison PET-CT scan (previously measuring 9 mm on 03/01/2020 with SUV max equal 4.5). No additional hypermetabolic nodules or lymph nodes in the abdomen pelvis. No abnormal activity liver. The spleen is normal size and normal volume. Incidental CT findings: Foley catheter in bladder SKELETON: No focal hypermetabolic activity to suggest skeletal metastasis. Incidental CT findings: none IMPRESSION: Enlarging intensely hypermetabolic nodule in the deep LEFT pelvis is most concerning for lymphoma recurrence ( [place] [facility]). No additional evidence of adenopathy on whole-body PET scan. Normal spleen and bone marrow. Electronically Signed   By: Suzy Bouchard M.D.   On: 04/27/2020 09:59   CT Biopsy  Result Date: 05/12/2020 CLINICAL DATA:  History of diffuse large B-cell lymphoma. Enlarging left internal iliac lymph node EXAM: CT GUIDED CORE BIOPSY OF PELVIC ADENOPATHY ANESTHESIA/SEDATION: Intravenous Fentanyl 138mcg and Versed 2mg  were administered as conscious sedation during continuous monitoring of the patient's level of consciousness and physiological / cardiorespiratory status by the radiology RN, with a total moderate sedation time of 10 minutes. PROCEDURE: The procedure risks, benefits, and alternatives were explained to the patient. Questions regarding the procedure were encouraged and answered. The patient  understands and consents to the procedure. Patient placed prone. Select axial scans through the pelvis obtained. Left internal iliac adenopathy identified and an appropriate skin site was determined and marked. The operative field was prepped with chlorhexidinein a sterile fashion, and a sterile drape was applied covering the operative field. A sterile gown and sterile gloves were used for the procedure. Local anesthesia was provided with 1% Lidocaine. Under CT fluoroscopic guidance, a 17 gauge trocar needle was advanced to the margin of the lesion. Once needle tip position was confirmed, coaxial 18-gauge core biopsy samples were obtained, submitted in saline to surgical pathology. The guide needle was removed. Postprocedure scans show no hemorrhage or other apparent complication. The patient tolerated the procedure well. COMPLICATIONS:  None immediate FINDINGS: Left internal iliac adenopathy localized. Representative core biopsy samples obtained as above. IMPRESSION: Technically successful CT-guided core biopsy, left internal iliac adenopathy. Electronically Signed   By: Lucrezia Europe M.D.   On: 05/12/2020 15:09      IMPRESSION/PLAN: Lymphoma, stage IV, refractory disease in left pelvis  Today, I talked to the patient about the findings and work-up thus far. We discussed the patient's diagnosis of lymphoma with refractory disease in the left pelvis and general treatment for this, highlighting the role of radiotherapy in the management. We discussed the available radiation techniques, and focused on the details of logistics and delivery. Of note, biopsy results are still pending to confirm refractory disease, but the PET scan is highly suspicious.  We did discuss that the most aggressive form of salvage therapy would be aggressive chemotherapy or stem cell transplant.  Given his comorbidities and age, this would be extremely risky for him.  This is not a mode of salvage recommended by medical oncology.  We  discussed the risks, benefits, and side effects of radiotherapy. Side effects may include but not necessarily be limited to: Fatigue, queasiness, urinary symptoms, loose or frequent stool; no guarantees of treatment were given.  The patient was encouraged to ask questions that I answered to the best of my ability.  He and his family are enthusiastic about this plan.  I anticipate a 2-week course of palliative radiotherapy.  We will get him scheduled for treatment planning next week.  We discussed measures to reduce the risk of infection during the COVID-19 pandemic.  He has not yet received the vaccine.  We discussed the risks and benefits of the vaccine.  I recommend that he receive it.  He would like to do so.  I helped him as well as his son get scheduled for the [place] vaccine to be administered at their local [facility] tomorrow.  They expressed appreciation for this.  I very much look forward to participating in the care of this wonderful gentleman.  This encounter was provided by telemedicine platform MyChart video The patient has given verbal consent for this type of encounter and has been advised to only accept a meeting of this type in a secure network environment. On date of service, in total, I spent 60 minutes on this encounter.  The attendants for this meeting include Eppie Gibson  and Asencion Partridge.  During the encounter, Eppie Gibson was located at [facility] Radiation Oncology Department.  Daniel Richards was located at home.      __________________________________________   Eppie Gibson, MD  This document serves as a record of services personally performed by Eppie Gibson, MD. It was created on her behalf by Wilburn Mylar, a trained medical scribe. The creation of this record is based on the scribe's personal observations and the provider's statements to them. This document has been checked and approved by the attending provider.

## 2020-05-14 ENCOUNTER — Encounter: Payer: Self-pay | Admitting: Radiation Oncology

## 2020-05-14 ENCOUNTER — Ambulatory Visit
Admission: RE | Admit: 2020-05-14 | Discharge: 2020-05-14 | Disposition: A | Discharge status: Home / Self Care | Payer: Medicare Other | Source: Ambulatory Visit | Attending: Radiation Oncology | Admitting: Radiation Oncology

## 2020-05-14 DIAGNOSIS — C8513 Unspecified B-cell lymphoma, intra-abdominal lymph nodes: Secondary | ICD-10-CM

## 2020-05-14 DIAGNOSIS — C8586 Other specified types of non-Hodgkin lymphoma, intrapelvic lymph nodes: Secondary | ICD-10-CM | POA: Insufficient documentation

## 2020-05-17 ENCOUNTER — Other Ambulatory Visit: Payer: Self-pay

## 2020-05-17 ENCOUNTER — Inpatient Hospital Stay (HOSPITAL_COMMUNITY): Payer: Medicare Other | Attending: Hematology | Admitting: Hematology

## 2020-05-17 VITALS — BP 130/57 | HR 63 | Temp 100.6°F | Resp 18 | Wt 178.8 lb

## 2020-05-17 DIAGNOSIS — Z7901 Long term (current) use of anticoagulants: Secondary | ICD-10-CM | POA: Insufficient documentation

## 2020-05-17 DIAGNOSIS — E559 Vitamin D deficiency, unspecified: Secondary | ICD-10-CM | POA: Diagnosis not present

## 2020-05-17 DIAGNOSIS — E785 Hyperlipidemia, unspecified: Secondary | ICD-10-CM | POA: Insufficient documentation

## 2020-05-17 DIAGNOSIS — M545 Low back pain, unspecified: Secondary | ICD-10-CM

## 2020-05-17 DIAGNOSIS — C8338 Diffuse large B-cell lymphoma, lymph nodes of multiple sites: Secondary | ICD-10-CM | POA: Insufficient documentation

## 2020-05-17 DIAGNOSIS — E1122 Type 2 diabetes mellitus with diabetic chronic kidney disease: Secondary | ICD-10-CM | POA: Insufficient documentation

## 2020-05-17 DIAGNOSIS — Z7984 Long term (current) use of oral hypoglycemic drugs: Secondary | ICD-10-CM | POA: Insufficient documentation

## 2020-05-17 DIAGNOSIS — R5383 Other fatigue: Secondary | ICD-10-CM | POA: Insufficient documentation

## 2020-05-17 DIAGNOSIS — Z87891 Personal history of nicotine dependence: Secondary | ICD-10-CM | POA: Insufficient documentation

## 2020-05-17 DIAGNOSIS — F329 Major depressive disorder, single episode, unspecified: Secondary | ICD-10-CM | POA: Diagnosis not present

## 2020-05-17 DIAGNOSIS — M79605 Pain in left leg: Secondary | ICD-10-CM | POA: Diagnosis not present

## 2020-05-17 DIAGNOSIS — D631 Anemia in chronic kidney disease: Secondary | ICD-10-CM | POA: Insufficient documentation

## 2020-05-17 DIAGNOSIS — N189 Chronic kidney disease, unspecified: Secondary | ICD-10-CM | POA: Diagnosis not present

## 2020-05-17 DIAGNOSIS — I129 Hypertensive chronic kidney disease with stage 1 through stage 4 chronic kidney disease, or unspecified chronic kidney disease: Secondary | ICD-10-CM | POA: Insufficient documentation

## 2020-05-17 DIAGNOSIS — K219 Gastro-esophageal reflux disease without esophagitis: Secondary | ICD-10-CM | POA: Diagnosis not present

## 2020-05-17 DIAGNOSIS — Z86711 Personal history of pulmonary embolism: Secondary | ICD-10-CM | POA: Diagnosis not present

## 2020-05-17 LAB — SURGICAL PATHOLOGY

## 2020-05-17 NOTE — Patient Instructions (Signed)
[facility] at [facility] Discharge Instructions  You were seen today by Dr. Delton Coombes. He went over your recent results. You will be scheduled for an MRI scan of your pelvis. Dr. Delton Coombes will contact you via phone for your next appointment.   Thank you for choosing [place] at [facility] to provide your oncology and hematology care.  To afford each patient quality time with our provider, please arrive at least 15 minutes before your scheduled appointment time.   If you have a lab appointment with the [facility] please come in thru the Main Entrance and check in at the main information desk  You need to re-schedule your appointment should you arrive 10 or more minutes late.  We strive to give you quality time with our providers, and arriving late affects you and other patients whose appointments are after yours.  Also, if you no show three or more times for appointments you may be dismissed from the clinic at the providers discretion.     Again, thank you for choosing [facility].  Our hope is that these requests will decrease the amount of time that you wait before being seen by our physicians.       _____________________________________________________________  Should you have questions after your visit to [facility], please contact our office at [phone] between the hours of 8:00 a.m. and 4:30 p.m.  Voicemails left after 4:00 p.m. will not be returned until the following business day.  For prescription refill requests, have your pharmacy contact our office and allow 72 hours.    Cancer Center Support Programs:   > Cancer Support Group  2nd Tuesday of the month 1pm-2pm, Journey Room

## 2020-05-17 NOTE — Progress Notes (Signed)
Daniel Richards, [place] [address]   CLINIC:  Medical Oncology/Hematology  PCP:  [name], FNP [facility] [address]  [phone]  REASON FOR VISIT:  Follow-up for DLBCL  PRIOR THERAPY: R mini CHOP x 6 cycles from 08/21/2019 through 01/21/2020.  CURRENT THERAPY: Surveillance  INTERVAL HISTORY:  Daniel Richards, a 74 y.o. male, returns for routine follow-up for his DLBCL. Daniel Richards was last seen on 03/04/2020.  Today he reports feeling fatigued and having trouble taking his shower today since he has no help at home to aid him. The fatigue is daily and moderate. His back pain radiating down his left leg continues to bother him and is preventing him from ambulating as much. The back pain has gotten slightly worse since discharge in 01/2020 and became elucidated after starting chemo. He denies having any hematuria or hematochezia. He is still wearing a catheter.  The radiologist, Dr. Eppie Gibson, conveyed to him on 05/14/2020 that given the biopsy report, he would most likely benefit from 3 weeks of daily radiation treatments. He reports having cervical spinal surgery in the last 5-10 years with screws placed in.   REVIEW OF SYSTEMS:  Review of Systems  Constitutional: Positive for fatigue (moderate). Negative for appetite change.  Respiratory: Positive for cough.   Gastrointestinal: Negative for blood in stool.  Genitourinary: Positive for difficulty urinating (catheter in place). Negative for hematuria.   Musculoskeletal: Positive for back pain (4/10 lower back pain radiating down L leg).  Psychiatric/Behavioral: Positive for depression.  All other systems reviewed and are negative.   PAST MEDICAL/SURGICAL HISTORY:  Past Medical History:  Diagnosis Date  . Acid reflux   . Anxiety and depression   . Cancer (Daniel Richards)    lymphoma  . Depression   . Diabetes ([place])   . Diabetes mellitus  06/09/2011   pt taking metformin  . DJD of shoulder    right   . Hyperlipidemia   . Hypertension   . Lymphoma ([place])   . Migraines   . Port-A-Cath in place 08/13/2019  . Pulmonary embolism ([place])   . Stomach ulcer   . Tremor   . Tremor   . Vitamin D deficiency    Past Surgical History:  Procedure Laterality Date  . BACK SURGERY    . KNEE SURGERY    . PORTACATH PLACEMENT Left 08/08/2019   Procedure: INSERTION PORT-A-CATH;  Surgeon: Aviva Signs, MD;  Location: [facility];  Service: General;  Laterality: Left;  . SHOULDER SURGERY      SOCIAL HISTORY:  Social History   Socioeconomic History  . Marital status: Divorced    Spouse name: Not on file  . Number of children: 2  . Years of education: 12+  . Highest education level: Not on file  Occupational History  . Not on file  Tobacco Use  . Smoking status: Former Smoker    Packs/day: 2.00    Years: 20.00    Pack years: 40.00    Types: Cigarettes    Quit date: 11/17/1989    Years since quitting: 30.5  . Smokeless tobacco: Never Used  Vaping Use  . Vaping Use: Never used  Substance and Sexual Activity  . Alcohol use: No  . Drug use: No  . Sexual activity: Not Currently  Other Topics Concern  . Not on file  Social History Narrative   Lives at home with son.   Caffeine  use: Drinks 1 cup coffee (3 cups per week-decaf)   Social Determinants of Health   Financial Resource Strain: Medium Risk  . Difficulty of Paying Living Expenses: Somewhat hard  Food Insecurity: No Food Insecurity  . Worried About Charity fundraiser in the Last Year: Never true  . Ran Out of Food in the Last Year: Never true  Transportation Needs: No Transportation Needs  . Lack of Transportation (Medical): No  . Lack of Transportation (Non-Medical): No  Physical Activity: Inactive  . Days of Exercise per Week: 0 days  . Minutes of Exercise per Session: 0 min  Stress: No Stress Concern Present  . Feeling of Stress : Not at all  Social Connections:  Socially Isolated  . Frequency of Communication with Friends and Family: Never  . Frequency of Social Gatherings with Friends and Family: Twice a week  . Attends Religious Services: More than 4 times per year  . Active Member of Clubs or Organizations: No  . Attends Archivist Meetings: Never  . Marital Status: Divorced  Human resources officer Violence: Not At Risk  . Fear of Current or Ex-Partner: No  . Emotionally Abused: No  . Physically Abused: No  . Sexually Abused: No    FAMILY HISTORY:  Family History  Problem Relation Age of Onset  . Cancer Sister   . Cancer Sister   . Cancer Brother   . Heart attack Father   . Cancer Brother   . Heart attack Brother   . Healthy Son   . Healthy Daughter   . Migraines Neg Hx     CURRENT MEDICATIONS:  Current Outpatient Medications  Medication Sig Dispense Refill  . allopurinol (ZYLOPRIM) 300 MG tablet Take 1 tablet (300 mg total) by mouth daily. 30 tablet 0  . calcitRIOL (ROCALTROL) 0.5 MCG capsule Take 1 capsule (0.5 mcg total) by mouth daily. 30 capsule 0  . citalopram (CELEXA) 20 MG tablet Take 1 tablet (20 mg total) by mouth at bedtime. (Patient taking differently: Take 40 mg by mouth at bedtime. ) 30 tablet 0  . cyclobenzaprine (FLEXERIL) 10 MG tablet Take 10 mg by mouth at bedtime.    . CYCLOPHOSPHAMIDE IV Inject into the vein every 21 ( twenty-one) days. (Patient not taking: Reported on 05/03/2020)    . DOXORUBICIN HCL IV Inject into the vein every 21 ( twenty-one) days. (Patient not taking: Reported on 05/03/2020)    . ELIQUIS 5 MG TABS tablet Take 5 mg by mouth 2 (two) times daily.    . folic acid (FOLVITE) 1 MG tablet Take 1 tablet (1 mg total) by mouth daily. (Patient not taking: Reported on 05/03/2020) 30 tablet 0  . gabapentin (NEURONTIN) 300 MG capsule Take 2 capsules (600 mg total) by mouth 3 (three) times daily. 90 capsule 0  . GAVILAX 17 GM/SCOOP powder Take 17 g by mouth daily as needed for mild constipation or  moderate constipation.  (Patient not taking: Reported on 05/03/2020)    . HYDROcodone-acetaminophen (NORCO) 10-325 MG tablet Take 1 tablet by mouth every 6 (six) hours as needed for moderate pain or severe pain.  (Patient not taking: Reported on 05/03/2020)    . meclizine (ANTIVERT) 25 MG tablet Take 25 mg by mouth daily as needed.    . metFORMIN (GLUCOPHAGE) 500 MG tablet Take 1 tablet (500 mg total) by mouth daily. 30 tablet 0  . pantoprazole (PROTONIX) 40 MG tablet Take 1 tablet (40 mg total) by mouth 2 (two) times daily  before a meal. 60 tablet 0  . primidone (MYSOLINE) 50 MG tablet Take 1.5 tablets (75 mg total) by mouth every 8 (eight) hours. (Patient taking differently: Take 100 mg by mouth 2 (two) times daily. ) 150 tablet 0  . propranolol (INDERAL) 40 MG tablet Take 1 tablet (40 mg total) by mouth 2 (two) times daily. 60 tablet 0  . riTUXimab in sodium chloride 0.9 % 250 mL Inject into the vein every 21 ( twenty-one) days. (Patient not taking: Reported on 05/03/2020)    . temazepam (RESTORIL) 15 MG capsule Take 1 capsule (15 mg total) by mouth at bedtime. 15 capsule 0  . topiramate (TOPAMAX) 100 MG tablet Take 2 tablets (200 mg total) by mouth at bedtime. 60 tablet 0  . vinCRIStine 2 mg in sodium chloride 0.9 % 50 mL Inject 2 mg into the vein every 21 ( twenty-one) days. (Patient not taking: Reported on 05/03/2020)     No current facility-administered medications for this visit.    ALLERGIES:  Allergies  Allergen Reactions  . Vancomycin Hives    Patient received Benadryl to treat the hives    PHYSICAL EXAM:  Performance status (ECOG): 2 - Symptomatic, <50% confined to bed  There were no vitals filed for this visit. Wt Readings from Last 3 Encounters:  05/03/20 180 lb 3.2 oz (81.7 kg)  03/04/20 174 lb 3.2 oz (79 kg)  03/01/20 162 lb (73.5 kg)   Physical Exam Vitals reviewed.  Constitutional:      Appearance: Normal appearance.  Musculoskeletal:     Lumbar back: Bony tenderness  present.  Neurological:     Mental Status: He is alert.     LABORATORY DATA:  I have reviewed the labs as listed.  CBC Latest Ref Rng & Units 04/26/2020 03/04/2020 02/11/2020  WBC 4.0 - 10.5 K/uL 5.2 5.1 7.6  Hemoglobin 13.0 - 17.0 g/dL 11.0(L) 9.2(L) 8.8(L)  Hematocrit 39 - 52 % 35.0(L) 29.5(L) 28.7(L)  Platelets 150 - 400 K/uL 309 248 292   CMP Latest Ref Rng & Units 04/26/2020 03/04/2020 02/11/2020  Glucose 70 - 99 mg/dL 165(H) 127(H) 210(H)  BUN 8 - 23 mg/dL 39(H) 25(H) 22  Creatinine 0.61 - 1.24 mg/dL 1.94(H) 1.59(H) 1.42(H)  Sodium 135 - 145 mmol/L 139 136 138  Potassium 3.5 - 5.1 mmol/L 4.8 4.1 3.9  Chloride 98 - 111 mmol/L 103 100 107  CO2 22 - 32 mmol/L 27 27 22   Calcium 8.9 - 10.3 mg/dL 10.1 9.1 9.1  Total Protein 6.5 - 8.1 g/dL 7.4 6.9 6.6  Total Bilirubin 0.3 - 1.2 mg/dL 0.3 0.5 0.3  Alkaline Phos 38 - 126 U/L 98 78 73  AST 15 - 41 U/L 27 16 15   ALT 0 - 44 U/L 30 13 13       Component Value Date/Time   RBC 3.38 (L) 04/26/2020 1330   MCV 103.6 (H) 04/26/2020 1330   MCH 32.5 04/26/2020 1330   MCHC 31.4 04/26/2020 1330   RDW 12.6 04/26/2020 1330   LYMPHSABS 1.5 04/26/2020 1330   MONOABS 0.5 04/26/2020 1330   EOSABS 0.2 04/26/2020 1330   BASOSABS 0.1 04/26/2020 1330   Lab Results  Component Value Date   LDH 120 04/26/2020   LDH 111 03/04/2020   LDH 104 02/11/2020   Surgical pathology ([id number]) from 05/12/2020: Left internal iliac lymph node: large B-cell lymphoma  DIAGNOSTIC IMAGING:  I have independently reviewed the scans and discussed with the patient. NM PET Image Restag (PS) Skull Base  To Thigh  Result Date: 04/27/2020 CLINICAL DATA:  Subsequent treatment strategy for B-cell lymphoma. EXAM: NUCLEAR MEDICINE PET SKULL BASE TO THIGH TECHNIQUE: 10.4 mCi F-18 FDG was injected intravenously. Full-ring PET imaging was performed from the skull base to thigh after the radiotracer. CT data was obtained and used for attenuation correction and anatomic  localization. Fasting blood glucose: 114 mg/dl COMPARISON:  PET-CT scan 03/01/2020 FINDINGS: Mediastinal blood pool activity: SUV max 1.4 Liver activity: SUV max 2.5 NECK: No hypermetabolic lymph nodes in the neck. Incidental CT findings: none CHEST: No hypermetabolic mediastinal or hilar nodes. No suspicious pulmonary nodules on the CT scan. Incidental CT findings: Port in the anterior chest wall with tip in distal SVC. ABDOMEN/PELVIS: Interval enlargement hypermetabolic nodule in the deep LEFT pelvis measuring 27 mm on image 266. This nodule is intensely hypermetabolic with SUV max equal 10.4 and is increased in size and metabolic activity from comparison PET-CT scan (previously measuring 9 mm on 03/01/2020 with SUV max equal 4.5). No additional hypermetabolic nodules or lymph nodes in the abdomen pelvis. No abnormal activity liver. The spleen is normal size and normal volume. Incidental CT findings: Foley catheter in bladder SKELETON: No focal hypermetabolic activity to suggest skeletal metastasis. Incidental CT findings: none IMPRESSION: Enlarging intensely hypermetabolic nodule in the deep LEFT pelvis is most concerning for lymphoma recurrence ( [place] 5). No additional evidence of adenopathy on whole-body PET scan. Normal spleen and bone marrow. Electronically Signed   By: Suzy Bouchard M.D.   On: 04/27/2020 09:59   CT Biopsy  Result Date: 05/12/2020 CLINICAL DATA:  History of diffuse large B-cell lymphoma. Enlarging left internal iliac lymph node EXAM: CT GUIDED CORE BIOPSY OF PELVIC ADENOPATHY ANESTHESIA/SEDATION: Intravenous Fentanyl 137mcg and Versed 2mg  were administered as conscious sedation during continuous monitoring of the patient's level of consciousness and physiological / cardiorespiratory status by the radiology RN, with a total moderate sedation time of 10 minutes. PROCEDURE: The procedure risks, benefits, and alternatives were explained to the patient. Questions regarding the procedure  were encouraged and answered. The patient understands and consents to the procedure. Patient placed prone. Select axial scans through the pelvis obtained. Left internal iliac adenopathy identified and an appropriate skin site was determined and marked. The operative field was prepped with chlorhexidinein a sterile fashion, and a sterile drape was applied covering the operative field. A sterile gown and sterile gloves were used for the procedure. Local anesthesia was provided with 1% Lidocaine. Under CT fluoroscopic guidance, a 17 gauge trocar needle was advanced to the margin of the lesion. Once needle tip position was confirmed, coaxial 18-gauge core biopsy samples were obtained, submitted in saline to surgical pathology. The guide needle was removed. Postprocedure scans show no hemorrhage or other apparent complication. The patient tolerated the procedure well. COMPLICATIONS: None immediate FINDINGS: Left internal iliac adenopathy localized. Representative core biopsy samples obtained as above. IMPRESSION: Technically successful CT-guided core biopsy, left internal iliac adenopathy. Electronically Signed   By: Lucrezia Europe M.D.   On: 05/12/2020 15:09     ASSESSMENT:  1.  Stage IV DLBCL: -6 cycles of R mini CHOP from 08/21/2019 through 01/21/2020. -PET scan on 03/01/2020 showed single nodular thickening 9 mm (SUV 4.5) In the deep left pelvis with metabolic activity slightly above background of the liver, Deauville 4.  This has decreased from PET scan on 12/07/2018.  No additional hypermetabolic lesions. -PET scan on 04/26/2020 showed interval enlargement of hypermetabolic nodule in the deep left pelvis measuring 27 mm, SUV  10.4 increased in size and metabolic activity. 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  2.  Pulmonary embolism: -Diagnosed with bilateral pulmonary embolism on 08/26/2019.  He is on Eliquis.   PLAN:  1.  Stage IV DLBCL: -I have  reviewed results of the pathology report with the patient in detail. -As this is the only area of recurrence, and given his performance status, I think radiation therapy followed by a repeat PET scan in 2 to 3 months is a very reasonable option. -He will follow up with Dr. Isidore Moos for radiation.  2.  Macrocytic anemia: -This is from CKD.  Ferritin is 170 and percent saturation is 36. -Hemoglobin improved 11.  3.  Pulmonary embolism: -Continue Eliquis.  We will continue indefinitely at this time.  4.  Back pain: -He reported back pain which has gotten worse in the last few weeks.  Pain is in the lower back and going down his left lower extremity. -I have recommended MRI of the pelvis to see if the lymphoma lesion is impinging on the nerve roots. -We will arrange for a phone follow-up.  5.  Urinary problems: -He has chronic indwelling Foley catheter and follows up with urology.  Orders placed this encounter:  No orders of the defined types were placed in this encounter.    Derek Jack, MD [place] [phone]   I, Milinda Antis, am acting as a scribe for Dr. Sanda Linger.  I, Derek Jack MD, have reviewed the above documentation for accuracy and completeness, and I agree with the above.

## 2020-05-18 ENCOUNTER — Ambulatory Visit: Payer: Medicare Other

## 2020-05-19 ENCOUNTER — Ambulatory Visit: Payer: Medicare Other | Admitting: Radiation Oncology

## 2020-05-21 ENCOUNTER — Ambulatory Visit
Admission: RE | Admit: 2020-05-21 | Discharge: 2020-05-21 | Disposition: A | Discharge status: Home / Self Care | Payer: Medicare Other | Source: Ambulatory Visit | Attending: Radiation Oncology | Admitting: Radiation Oncology

## 2020-05-21 ENCOUNTER — Other Ambulatory Visit: Payer: Self-pay

## 2020-05-21 DIAGNOSIS — Z510 Encounter for antineoplastic radiation therapy: Secondary | ICD-10-CM | POA: Insufficient documentation

## 2020-05-21 DIAGNOSIS — C8586 Other specified types of non-Hodgkin lymphoma, intrapelvic lymph nodes: Secondary | ICD-10-CM | POA: Diagnosis not present

## 2020-05-24 DIAGNOSIS — Z510 Encounter for antineoplastic radiation therapy: Secondary | ICD-10-CM | POA: Diagnosis not present

## 2020-05-26 ENCOUNTER — Other Ambulatory Visit: Payer: Self-pay

## 2020-05-26 ENCOUNTER — Ambulatory Visit
Admission: RE | Admit: 2020-05-26 | Discharge: 2020-05-26 | Disposition: A | Discharge status: Home / Self Care | Payer: Medicare Other | Source: Ambulatory Visit | Attending: Radiation Oncology | Admitting: Radiation Oncology

## 2020-05-26 DIAGNOSIS — Z510 Encounter for antineoplastic radiation therapy: Secondary | ICD-10-CM | POA: Diagnosis not present

## 2020-05-27 ENCOUNTER — Ambulatory Visit
Admission: RE | Admit: 2020-05-27 | Discharge: 2020-05-27 | Disposition: A | Discharge status: Home / Self Care | Payer: Medicare Other | Source: Ambulatory Visit | Attending: Radiation Oncology | Admitting: Radiation Oncology

## 2020-05-27 ENCOUNTER — Other Ambulatory Visit: Payer: Self-pay

## 2020-05-27 DIAGNOSIS — Z510 Encounter for antineoplastic radiation therapy: Secondary | ICD-10-CM | POA: Diagnosis not present

## 2020-05-28 ENCOUNTER — Other Ambulatory Visit: Payer: Self-pay

## 2020-05-28 ENCOUNTER — Ambulatory Visit
Admission: RE | Admit: 2020-05-28 | Discharge: 2020-05-28 | Disposition: A | Discharge status: Home / Self Care | Payer: Medicare Other | Source: Ambulatory Visit | Attending: Radiation Oncology | Admitting: Radiation Oncology

## 2020-05-28 DIAGNOSIS — Z510 Encounter for antineoplastic radiation therapy: Secondary | ICD-10-CM | POA: Diagnosis not present

## 2020-05-31 ENCOUNTER — Other Ambulatory Visit: Payer: Self-pay

## 2020-05-31 ENCOUNTER — Ambulatory Visit (HOSPITAL_COMMUNITY)
Admission: RE | Admit: 2020-05-31 | Discharge: 2020-05-31 | Disposition: A | Discharge status: Home / Self Care | Payer: Medicare Other | Source: Ambulatory Visit | Attending: Hematology | Admitting: Hematology

## 2020-05-31 ENCOUNTER — Ambulatory Visit
Admission: RE | Admit: 2020-05-31 | Discharge: 2020-05-31 | Disposition: A | Discharge status: Home / Self Care | Payer: Medicare Other | Source: Ambulatory Visit | Attending: Radiation Oncology | Admitting: Radiation Oncology

## 2020-05-31 ENCOUNTER — Other Ambulatory Visit: Payer: Self-pay | Admitting: Radiation Therapy

## 2020-05-31 DIAGNOSIS — M545 Low back pain, unspecified: Secondary | ICD-10-CM

## 2020-05-31 DIAGNOSIS — Z510 Encounter for antineoplastic radiation therapy: Secondary | ICD-10-CM | POA: Diagnosis not present

## 2020-06-01 ENCOUNTER — Other Ambulatory Visit: Payer: Self-pay

## 2020-06-01 ENCOUNTER — Telehealth: Payer: Self-pay | Admitting: Radiation Therapy

## 2020-06-01 ENCOUNTER — Inpatient Hospital Stay (HOSPITAL_BASED_OUTPATIENT_CLINIC_OR_DEPARTMENT_OTHER): Payer: Medicare Other | Admitting: Hematology

## 2020-06-01 ENCOUNTER — Ambulatory Visit
Admission: RE | Admit: 2020-06-01 | Discharge: 2020-06-01 | Disposition: A | Discharge status: Home / Self Care | Payer: Medicare Other | Source: Ambulatory Visit | Attending: Radiation Oncology | Admitting: Radiation Oncology

## 2020-06-01 DIAGNOSIS — C8333 Diffuse large B-cell lymphoma, intra-abdominal lymph nodes: Secondary | ICD-10-CM

## 2020-06-01 DIAGNOSIS — Z510 Encounter for antineoplastic radiation therapy: Secondary | ICD-10-CM | POA: Diagnosis not present

## 2020-06-01 NOTE — Telephone Encounter (Signed)
I left a detailed voicemail about Daniel Richards upcoming appointment with Dr. Hilma Favors on 7/30 @ 10:30. I also included my contact information in case he has any questions or concerns about this visit, he can give me a call back.  Mont Dutton R.T.(R)(T) Radiation Special Procedures Navigator

## 2020-06-02 ENCOUNTER — Ambulatory Visit
Admission: RE | Admit: 2020-06-02 | Discharge: 2020-06-02 | Disposition: A | Discharge status: Home / Self Care | Payer: Medicare Other | Source: Ambulatory Visit | Attending: Radiation Oncology | Admitting: Radiation Oncology

## 2020-06-02 ENCOUNTER — Other Ambulatory Visit: Payer: Self-pay

## 2020-06-02 DIAGNOSIS — Z510 Encounter for antineoplastic radiation therapy: Secondary | ICD-10-CM | POA: Diagnosis not present

## 2020-06-02 NOTE — Progress Notes (Signed)
Virtual Visit via Telephone Note  I connected with Asencion Partridge on 06/02/20 at  4:15 PM EDT by telephone and verified that I am speaking with the correct person using two identifiers.  I have connected with the patient's daughter on 06/02/2020 around 8 AM.  I could not reach the patient in the evening of 06/01/2020. I discussed the limitations, risks, security and privacy concerns of performing an evaluation and management service by telephone and the availability of in person appointments. I also discussed with the patient that there may be a patient responsible charge related to this service. The patient expressed understanding and agreed to proceed.   History of Present Illness: He is seen in our office for follow-up of large B-cell lymphoma.  He completed chemotherapy with 6 cycles of R mini CHOP from 08/21/2019 through 01/21/2020.  He had most recent PET scan on 04/26/2020 which showed interval enlargement of hypermetabolic nodule in the deep left pelvis measuring 27 mm, SUV 10.4 increased in size and metabolic activity.  Lymph node biopsy on 05/12/2020 consistent with large B cell lymphoma, ABC type.   Observations/Objective: He started radiation on 05/26/2020.  Is feeling weak since he started radiation.  However his pain in the lower back and left leg is stable at this time.  Assessment and Plan:  1.  Stage IV DLBCL: -I discussed the results of MRI which shows 6.2 x 4.4 x 3.9 cm soft tissue mass along the posterior left pelvic sidewall and presacral space has increased slightly in size from 05/12/2020.  Tumor is extending into S2 foramen causing attenuation and abnormal signal in the S2 root consistent with tumor involvement.  Tumor may minimally involve S1 root and S3 to concerning for involvement. -I have reached out to Dr. Isidore Moos to make sure that whole tumor is included in the field. -I will follow him up to assess pain control in 4 weeks. -I plan to schedule another PET scan around  07/27/2020.   Follow Up Instructions:   RTC 4 weeks with labs. I discussed the assessment and treatment plan with the patient. The patient was provided an opportunity to ask questions and all were answered. The patient agreed with the plan and demonstrated an understanding of the instructions.   The patient was advised to call back or seek an in-person evaluation if the symptoms worsen or if the condition fails to improve as anticipated.  I provided 7 minutes of non-face-to-face time during this encounter.   Derek Jack, MD

## 2020-06-03 ENCOUNTER — Ambulatory Visit: Payer: Medicare Other

## 2020-06-04 ENCOUNTER — Ambulatory Visit
Admission: RE | Admit: 2020-06-04 | Discharge: 2020-06-04 | Disposition: A | Discharge status: Home / Self Care | Payer: Medicare Other | Source: Ambulatory Visit | Attending: Radiation Oncology | Admitting: Radiation Oncology

## 2020-06-04 ENCOUNTER — Other Ambulatory Visit: Payer: Self-pay

## 2020-06-04 ENCOUNTER — Inpatient Hospital Stay: Payer: Medicare Other | Attending: Internal Medicine | Admitting: Internal Medicine

## 2020-06-04 DIAGNOSIS — C8513 Unspecified B-cell lymphoma, intra-abdominal lymph nodes: Secondary | ICD-10-CM | POA: Diagnosis not present

## 2020-06-04 DIAGNOSIS — F339 Major depressive disorder, recurrent, unspecified: Secondary | ICD-10-CM

## 2020-06-04 DIAGNOSIS — E440 Moderate protein-calorie malnutrition: Secondary | ICD-10-CM | POA: Diagnosis not present

## 2020-06-04 DIAGNOSIS — Z515 Encounter for palliative care: Secondary | ICD-10-CM | POA: Diagnosis not present

## 2020-06-04 DIAGNOSIS — Z510 Encounter for antineoplastic radiation therapy: Secondary | ICD-10-CM | POA: Diagnosis not present

## 2020-06-04 MED ORDER — CRANBERRY PLUS PROBIOTIC PO TABS: ORAL_TABLET | ORAL | 12 refills | Status: DC

## 2020-06-04 MED ORDER — BENEFIBER PO POWD: ORAL | 0 refills | Status: DC

## 2020-06-04 NOTE — Progress Notes (Signed)
.  Palliative Care Outpatient Clinic Note  Name: OSHA ERRICO Date: 06/04/2020 MRN: [id number]  DOB: [birth year]  Palliative Care consult requested for this 74 y.o. male for goals of care, symptom management and care coordination.  Referred by Dr. Isidore Moos.  REVIEW OF SYSTEMS:  +bowel issues, urgency, incomplete bowel movement +indwelling Foley, retention issues +immobility, gait issues +weakness  SPIRITUAL SUPPORT SYSTEM: Yes.  SOCIAL HISTORY:  reports that he quit smoking about 30 years ago. His smoking use included cigarettes. He has a 40.00 pack-year smoking history. He has never used smokeless tobacco. He reports that he does not drink alcohol and does not use drugs.  LEGAL DOCUMENTS:    CODE STATUS: DNR, last hospitalization, encouraged completion of documents  PERFORMANCE STATUS (ECOG) : 3 - Symptomatic, >50% confined to bed  PAST MEDICAL HISTORY: Past Medical History:  Diagnosis Date  . Acid reflux   . Anxiety and depression   . Cancer ([place])    lymphoma  . Depression   . Diabetes ([place])   . Diabetes mellitus 06/09/2011   pt taking metformin  . DJD of shoulder    right   . Hyperlipidemia   . Hypertension   . Lymphoma ([place])   . Migraines   . Port-A-Cath in place 08/13/2019  . Pulmonary embolism ([place])   . Stomach ulcer   . Tremor   . Tremor   . Vitamin D deficiency     PAST SURGICAL HISTORY:  Past Surgical History:  Procedure Laterality Date  . BACK SURGERY    . KNEE SURGERY    . PORTACATH PLACEMENT Left 08/08/2019   Procedure: INSERTION PORT-A-CATH;  Surgeon: Aviva Signs, MD;  Location: [facility];  Service: General;  Laterality: Left;  . SHOULDER SURGERY      ALLERGIES:  is allergic to vancomycin.  MEDICATIONS:  Current Outpatient Medications  Medication Sig Dispense Refill  . allopurinol (ZYLOPRIM) 300 MG tablet Take 1 tablet (300 mg total) by mouth daily. 30 tablet 0  . calcitRIOL (ROCALTROL) 0.5 MCG capsule Take 1 capsule (0.5 mcg total) by mouth  daily. 30 capsule 0  . citalopram (CELEXA) 20 MG tablet Take 1 tablet (20 mg total) by mouth at bedtime. (Patient taking differently: Take 40 mg by mouth at bedtime. ) 30 tablet 0  . cyclobenzaprine (FLEXERIL) 10 MG tablet Take 10 mg by mouth at bedtime.    . CYCLOPHOSPHAMIDE IV Inject into the vein every 21 ( twenty-one) days.     Marland Kitchen DOXORUBICIN HCL IV Inject into the vein every 21 ( twenty-one) days.     Marland Kitchen ELIQUIS 5 MG TABS tablet Take 5 mg by mouth 2 (two) times daily.    . folic acid (FOLVITE) 1 MG tablet Take 1 tablet (1 mg total) by mouth daily. 30 tablet 0  . gabapentin (NEURONTIN) 300 MG capsule Take 2 capsules (600 mg total) by mouth 3 (three) times daily. 90 capsule 0  . GAVILAX 17 GM/SCOOP powder Take 17 g by mouth daily as needed for mild constipation or moderate constipation.     Marland Kitchen HYDROcodone-acetaminophen (NORCO) 10-325 MG tablet Take 1 tablet by mouth every 6 (six) hours as needed for moderate pain or severe pain.     . meclizine (ANTIVERT) 25 MG tablet Take 25 mg by mouth daily as needed. (Patient not taking: Reported on 06/01/2020)    . metFORMIN (GLUCOPHAGE) 500 MG tablet Take 1 tablet (500 mg total) by mouth daily. 30 tablet 0  . pantoprazole (PROTONIX) 40 MG tablet  Take 1 tablet (40 mg total) by mouth 2 (two) times daily before a meal. 60 tablet 0  . primidone (MYSOLINE) 50 MG tablet Take 1.5 tablets (75 mg total) by mouth every 8 (eight) hours. (Patient taking differently: Take 100 mg by mouth 2 (two) times daily. ) 150 tablet 0  . propranolol (INDERAL) 40 MG tablet Take 1 tablet (40 mg total) by mouth 2 (two) times daily. 60 tablet 0  . riTUXimab in sodium chloride 0.9 % 250 mL Inject into the vein every 21 ( twenty-one) days.     . temazepam (RESTORIL) 15 MG capsule Take 1 capsule (15 mg total) by mouth at bedtime. 15 capsule 0  . topiramate (TOPAMAX) 100 MG tablet Take 2 tablets (200 mg total) by mouth at bedtime. 60 tablet 0  . vinCRIStine 2 mg in sodium chloride 0.9 % 50 mL  Inject 2 mg into the vein every 21 ( twenty-one) days.      No current facility-administered medications for this visit.    PHYSICAL EXAM: Pleasant, elderly gentleman sitting in a wheelchair accompanied by his son, no distress AOX3, makes good eye contact Foley Bag, urine is clear, weak from tx today  IMPRESSION: 74 yo man currently being treated for B-Cell Lymphoma-tolerating chemotherapy, receiving radiation for pelvic wall mass causing compression of sacral and lumbar nerve root. Main issues are pain and immobility from tumor compression. He can walk with assistance but only very short distances. He lives with his son who is a willing and contentious caregiver and also has support from his daughter.  PLAN: 1. Add Benefiber to diet 2. Cranberry probiotic supplement 3. Discussed pain control and reassured him that addiction was not a concern- sleep and comfort are important to his treatment plan. No changes to his meds today. 4. Goals of Care: to stay independent for as long as possible 5. Unmet needs at home-could use PT for safety and gait eval and tx and will also have community based PC see him to discuss ACP 6. Patient was too fatigued to complete ACP today- will request PC community based team address.  REFERRALS TO BE ORDERED:  Physical Therapy, Community based PC   More than 50% of the visit was spent in counseling/coordination of care:YES

## 2020-06-04 NOTE — Addendum Note (Signed)
Addended by: Acquanetta Chain on: 06/04/2020 02:47 PM   Modules accepted: Orders

## 2020-06-07 ENCOUNTER — Other Ambulatory Visit: Payer: Self-pay

## 2020-06-07 ENCOUNTER — Ambulatory Visit
Admission: RE | Admit: 2020-06-07 | Discharge: 2020-06-07 | Disposition: A | Discharge status: Home / Self Care | Payer: Medicare Other | Source: Ambulatory Visit | Attending: Radiation Oncology | Admitting: Radiation Oncology

## 2020-06-07 DIAGNOSIS — C8586 Other specified types of non-Hodgkin lymphoma, intrapelvic lymph nodes: Secondary | ICD-10-CM | POA: Diagnosis not present

## 2020-06-07 DIAGNOSIS — Z510 Encounter for antineoplastic radiation therapy: Secondary | ICD-10-CM | POA: Insufficient documentation

## 2020-06-08 ENCOUNTER — Ambulatory Visit
Admission: RE | Admit: 2020-06-08 | Discharge: 2020-06-08 | Disposition: A | Discharge status: Home / Self Care | Payer: Medicare Other | Source: Ambulatory Visit | Attending: Radiation Oncology | Admitting: Radiation Oncology

## 2020-06-08 ENCOUNTER — Ambulatory Visit: Payer: Medicare Other

## 2020-06-08 ENCOUNTER — Other Ambulatory Visit: Payer: Self-pay

## 2020-06-08 DIAGNOSIS — Z510 Encounter for antineoplastic radiation therapy: Secondary | ICD-10-CM | POA: Diagnosis not present

## 2020-06-09 ENCOUNTER — Encounter: Payer: Self-pay | Admitting: Radiation Oncology

## 2020-06-09 ENCOUNTER — Other Ambulatory Visit: Payer: Self-pay

## 2020-06-09 ENCOUNTER — Ambulatory Visit
Admission: RE | Admit: 2020-06-09 | Discharge: 2020-06-09 | Disposition: A | Discharge status: Home / Self Care | Payer: Medicare Other | Source: Ambulatory Visit | Attending: Radiation Oncology | Admitting: Radiation Oncology

## 2020-06-09 DIAGNOSIS — Z510 Encounter for antineoplastic radiation therapy: Secondary | ICD-10-CM | POA: Diagnosis not present

## 2020-06-29 ENCOUNTER — Inpatient Hospital Stay (HOSPITAL_COMMUNITY): Payer: Medicare Other | Attending: Hematology | Admitting: Hematology

## 2020-06-29 ENCOUNTER — Inpatient Hospital Stay (HOSPITAL_COMMUNITY): Payer: Medicare Other

## 2020-06-29 ENCOUNTER — Other Ambulatory Visit: Payer: Self-pay

## 2020-06-29 VITALS — BP 99/54 | HR 65 | Temp 97.3°F | Resp 17 | Wt 181.8 lb

## 2020-06-29 DIAGNOSIS — E785 Hyperlipidemia, unspecified: Secondary | ICD-10-CM | POA: Diagnosis not present

## 2020-06-29 DIAGNOSIS — M25551 Pain in right hip: Secondary | ICD-10-CM | POA: Insufficient documentation

## 2020-06-29 DIAGNOSIS — N189 Chronic kidney disease, unspecified: Secondary | ICD-10-CM | POA: Insufficient documentation

## 2020-06-29 DIAGNOSIS — Z87891 Personal history of nicotine dependence: Secondary | ICD-10-CM | POA: Insufficient documentation

## 2020-06-29 DIAGNOSIS — E1122 Type 2 diabetes mellitus with diabetic chronic kidney disease: Secondary | ICD-10-CM | POA: Diagnosis not present

## 2020-06-29 DIAGNOSIS — R5383 Other fatigue: Secondary | ICD-10-CM | POA: Diagnosis not present

## 2020-06-29 DIAGNOSIS — Z79899 Other long term (current) drug therapy: Secondary | ICD-10-CM | POA: Insufficient documentation

## 2020-06-29 DIAGNOSIS — R599 Enlarged lymph nodes, unspecified: Secondary | ICD-10-CM | POA: Diagnosis not present

## 2020-06-29 DIAGNOSIS — K219 Gastro-esophageal reflux disease without esophagitis: Secondary | ICD-10-CM | POA: Diagnosis not present

## 2020-06-29 DIAGNOSIS — Z809 Family history of malignant neoplasm, unspecified: Secondary | ICD-10-CM | POA: Diagnosis not present

## 2020-06-29 DIAGNOSIS — N400 Enlarged prostate without lower urinary tract symptoms: Secondary | ICD-10-CM | POA: Diagnosis not present

## 2020-06-29 DIAGNOSIS — F418 Other specified anxiety disorders: Secondary | ICD-10-CM | POA: Insufficient documentation

## 2020-06-29 DIAGNOSIS — C8336 Diffuse large B-cell lymphoma, intrapelvic lymph nodes: Secondary | ICD-10-CM | POA: Diagnosis not present

## 2020-06-29 DIAGNOSIS — E559 Vitamin D deficiency, unspecified: Secondary | ICD-10-CM | POA: Insufficient documentation

## 2020-06-29 DIAGNOSIS — Z7984 Long term (current) use of oral hypoglycemic drugs: Secondary | ICD-10-CM | POA: Diagnosis not present

## 2020-06-29 DIAGNOSIS — M545 Low back pain: Secondary | ICD-10-CM | POA: Diagnosis not present

## 2020-06-29 DIAGNOSIS — D631 Anemia in chronic kidney disease: Secondary | ICD-10-CM | POA: Insufficient documentation

## 2020-06-29 DIAGNOSIS — Z923 Personal history of irradiation: Secondary | ICD-10-CM | POA: Insufficient documentation

## 2020-06-29 DIAGNOSIS — Z86711 Personal history of pulmonary embolism: Secondary | ICD-10-CM | POA: Insufficient documentation

## 2020-06-29 DIAGNOSIS — I129 Hypertensive chronic kidney disease with stage 1 through stage 4 chronic kidney disease, or unspecified chronic kidney disease: Secondary | ICD-10-CM | POA: Insufficient documentation

## 2020-06-29 DIAGNOSIS — C8513 Unspecified B-cell lymphoma, intra-abdominal lymph nodes: Secondary | ICD-10-CM

## 2020-06-29 LAB — CBC WITH DIFFERENTIAL/PLATELET
Abs Immature Granulocytes: 0.03 10*3/uL (ref 0.00–0.07)
Basophils Absolute: 0 10*3/uL (ref 0.0–0.1)
Basophils Relative: 1 %
Eosinophils Absolute: 0.3 10*3/uL (ref 0.0–0.5)
Eosinophils Relative: 7 %
HCT: 32.6 % — ABNORMAL LOW (ref 39.0–52.0)
Hemoglobin: 9.8 g/dL — ABNORMAL LOW (ref 13.0–17.0)
Immature Granulocytes: 1 %
Lymphocytes Relative: 22 %
Lymphs Abs: 0.9 10*3/uL (ref 0.7–4.0)
MCH: 31.9 pg (ref 26.0–34.0)
MCHC: 30.1 g/dL (ref 30.0–36.0)
MCV: 106.2 fL — ABNORMAL HIGH (ref 80.0–100.0)
Monocytes Absolute: 0.5 10*3/uL (ref 0.1–1.0)
Monocytes Relative: 12 %
Neutro Abs: 2.3 10*3/uL (ref 1.7–7.7)
Neutrophils Relative %: 57 %
Platelets: 253 10*3/uL (ref 150–400)
RBC: 3.07 MIL/uL — ABNORMAL LOW (ref 4.22–5.81)
RDW: 14.8 % (ref 11.5–15.5)
WBC: 4.1 10*3/uL (ref 4.0–10.5)
nRBC: 0 % (ref 0.0–0.2)

## 2020-06-29 LAB — COMPREHENSIVE METABOLIC PANEL
ALT: 17 U/L (ref 0–44)
AST: 16 U/L (ref 15–41)
Albumin: 3.7 g/dL (ref 3.5–5.0)
Alkaline Phosphatase: 107 U/L (ref 38–126)
Anion gap: 8 (ref 5–15)
BUN: 31 mg/dL — ABNORMAL HIGH (ref 8–23)
CO2: 27 mmol/L (ref 22–32)
Calcium: 9.2 mg/dL (ref 8.9–10.3)
Chloride: 105 mmol/L (ref 98–111)
Creatinine, Ser: 2.16 mg/dL — ABNORMAL HIGH (ref 0.61–1.24)
GFR calc Af Amer: 33 mL/min — ABNORMAL LOW (ref >60)
GFR calc non Af Amer: 29 mL/min — ABNORMAL LOW (ref >60)
Glucose, Bld: 112 mg/dL — ABNORMAL HIGH (ref 70–99)
Potassium: 4.5 mmol/L (ref 3.5–5.1)
Sodium: 140 mmol/L (ref 135–145)
Total Bilirubin: 0.4 mg/dL (ref 0.3–1.2)
Total Protein: 7.5 g/dL (ref 6.5–8.1)

## 2020-06-29 LAB — LACTATE DEHYDROGENASE: LDH: 128 U/L (ref 98–192)

## 2020-06-29 LAB — MAGNESIUM: Magnesium: 2.1 mg/dL (ref 1.7–2.4)

## 2020-06-29 NOTE — Progress Notes (Signed)
[place], [place] [address]   CLINIC:  Medical Oncology/Hematology  PCP:  Daniel Sender, FNP [facility] [address]  [phone]  REASON FOR VISIT:  Follow-up for stage IV DLBCL  PRIOR THERAPY: Radiation therapy from 05/27/2020 to 06/09/2020.  CURRENT THERAPY: Observation  INTERVAL HISTORY:  [name], a 74 y.o. male, returns for routine follow-up for his stage IV DLBCL. Daniel Richards was last contacted via telephone on 06/01/2020.  Today he is accompanied by his son. He went to radiation for 10 days and finished his radiation on 8/4 and reports that his pain has not improved; he does not take any pain medications. He complains of pain in the back part of his right hip and down the right leg; he gets fatigued quickly if he has to stand for any amount of time, even with a walker. His appetite is good and he has been eating well. He still has a urinary catheter in place.  He reports that he spends most of the day sitting in a recliner or sleeping and is minimally active due to pain in his back.   REVIEW OF SYSTEMS:  Review of Systems  Constitutional: Positive for fatigue (depleted). Negative for appetite change.  Gastrointestinal: Positive for diarrhea.  Musculoskeletal: Positive for arthralgias (4/10 pain in R hip and legs).  All other systems reviewed and are negative.   PAST MEDICAL/SURGICAL HISTORY:  Past Medical History:  Diagnosis Date   Acid reflux    Anxiety and depression    Cancer ([place])    lymphoma   Depression    Diabetes ([place])    Diabetes mellitus 06/09/2011   pt taking metformin   DJD of shoulder    right    Hyperlipidemia    Hypertension    Lymphoma ([place])    Migraines    Port-A-Cath in place 08/13/2019   Pulmonary embolism ([place])    Stomach ulcer    Tremor    Tremor    Vitamin D deficiency    Past Surgical History:  Procedure Laterality Date     BACK SURGERY     KNEE SURGERY     PORTACATH PLACEMENT Left 08/08/2019   Procedure: INSERTION PORT-A-CATH;  Surgeon: Aviva Signs, MD;  Location: [facility];  Service: General;  Laterality: Left;   SHOULDER SURGERY      SOCIAL HISTORY:  Social History   Socioeconomic History   Marital status: Divorced    Spouse name: Not on file   Number of children: 2   Years of education: 12+   Highest education level: Not on file  Occupational History   Not on file  Tobacco Use   Smoking status: Former Smoker    Packs/day: 2.00    Years: 20.00    Pack years: 40.00    Types: Cigarettes    Quit date: 11/17/1989    Years since quitting: 30.6   Smokeless tobacco: Never Used  Vaping Use   Vaping Use: Never used  Substance and Sexual Activity   Alcohol use: No   Drug use: No   Sexual activity: Not Currently  Other Topics Concern   Not on file  Social History Narrative   Lives at home with son.   Caffeine use: Drinks 1 cup coffee (3 cups per week-decaf)   Social Determinants of Health   Financial Resource Strain: Medium Risk   Difficulty of Paying Living Expenses: Somewhat hard  Food Insecurity: No Food Insecurity   Worried About Charity fundraiser in the Last Year: Never true   Ran Out of Food in the Last Year: Never true  Transportation Needs: No Transportation Needs   Lack of Transportation (Medical): No   Lack of Transportation (Non-Medical): No  Physical Activity: Inactive   Days of Exercise per Week: 0 days   Minutes of Exercise per Session: 0 min  Stress: No Stress Concern Present   Feeling of Stress : Not at all  Social Connections: Socially Isolated   Frequency of Communication with Friends and Family: Never   Frequency of Social Gatherings with Friends and Family: Twice a week   Attends Religious Services: More than 4 times per year   Active Member of [employer] or Organizations: No   Attends Music therapist: Never   Marital  Status: Divorced  Human resources officer Violence: Not At Risk   Fear of Current or Ex-Partner: No   Emotionally Abused: No   Physically Abused: No   Sexually Abused: No    FAMILY HISTORY:  Family History  Problem Relation Age of Onset   Cancer Sister    Cancer Sister    Cancer Brother    Heart attack Father    Cancer Brother    Heart attack Brother    Healthy Son    Healthy Daughter    Migraines Neg Hx     CURRENT MEDICATIONS:  Current Outpatient Medications  Medication Sig Dispense Refill   allopurinol (ZYLOPRIM) 300 MG tablet Take 1 tablet (300 mg total) by mouth daily. 30 tablet 0   calcitRIOL (ROCALTROL) 0.5 MCG capsule Take 1 capsule (0.5 mcg total) by mouth daily. 30 capsule 0   citalopram (CELEXA) 20 MG tablet Take 1 tablet (20 mg total) by mouth at bedtime. (Patient taking differently: Take 40 mg by mouth at bedtime. ) 30 tablet 0   Cranberry-Vit C-Probiotic-Ca (CRANBERRY PLUS PROBIOTIC) TABS Take as directed on package. 30 tablet 12   cyclobenzaprine (FLEXERIL) 10 MG tablet Take 10 mg by mouth at bedtime.     CYCLOPHOSPHAMIDE IV Inject into the vein every 21 ( twenty-one) days.      DOXORUBICIN HCL IV Inject into the vein every 21 ( twenty-one) days.      ELIQUIS 5 MG TABS tablet Take 5 mg by mouth 2 (two) times daily.     folic acid (FOLVITE) 1 MG tablet Take 1 tablet (1 mg total) by mouth daily. 30 tablet 0   gabapentin (NEURONTIN) 300 MG capsule Take 2 capsules (600 mg total) by mouth 3 (three) times daily. 90 capsule 0   GAVILAX 17 GM/SCOOP powder Take 17 g by mouth daily as needed for mild constipation or moderate constipation.      HYDROcodone-acetaminophen (NORCO) 10-325 MG tablet Take 1 tablet by mouth every 6 (six) hours as needed for moderate pain or severe pain.      loperamide (IMODIUM) 2 MG capsule SMARTSIG:1 Capsule(s) By Mouth 1 to 4 Times Daily PRN     meclizine (ANTIVERT) 25 MG tablet Take 25 mg by mouth daily as needed.      metFORMIN (GLUCOPHAGE) 500 MG tablet Take 1 tablet (500 mg total) by mouth daily. 30 tablet 0   pantoprazole (PROTONIX) 40 MG tablet Take 1 tablet (40 mg total) by mouth 2 (two) times daily before a meal. 60 tablet 0   primidone (MYSOLINE) 50 MG tablet Take 1.5 tablets (75 mg total) by mouth every 8 (eight) hours. (  Patient taking differently: Take 100 mg by mouth 2 (two) times daily. ) 150 tablet 0   propranolol (INDERAL) 40 MG tablet Take 1 tablet (40 mg total) by mouth 2 (two) times daily. 60 tablet 0   riTUXimab in sodium chloride 0.9 % 250 mL Inject into the vein every 21 ( twenty-one) days.      temazepam (RESTORIL) 15 MG capsule Take 1 capsule (15 mg total) by mouth at bedtime. 15 capsule 0   topiramate (TOPAMAX) 100 MG tablet Take 2 tablets (200 mg total) by mouth at bedtime. 60 tablet 0   vinCRIStine 2 mg in sodium chloride 0.9 % 50 mL Inject 2 mg into the vein every 21 ( twenty-one) days.      Wheat Dextrin (BENEFIBER) POWD Take as directed on package. 1 g 0   No current facility-administered medications for this visit.    ALLERGIES:  Allergies  Allergen Reactions   Vancomycin Hives    Patient received Benadryl to treat the hives    PHYSICAL EXAM:  Performance status (ECOG): 2 - Symptomatic, <50% confined to bed  Vitals:   06/29/20 1534  BP: (!) 99/54  Pulse: 65  Resp: 17  Temp: (!) 97.3 F (36.3 C)  SpO2: 99%   Wt Readings from Last 3 Encounters:  06/29/20 181 lb 12.8 oz (82.5 kg)  05/17/20 178 lb 12.8 oz (81.1 kg)  05/03/20 180 lb 3.2 oz (81.7 kg)   Physical Exam Vitals reviewed.  Constitutional:      Appearance: Normal appearance.  Cardiovascular:     Rate and Rhythm: Normal rate and regular rhythm.     Pulses: Normal pulses.     Heart sounds: Normal heart sounds.  Pulmonary:     Effort: Pulmonary effort is normal.     Breath sounds: Normal breath sounds.  Musculoskeletal:     Lumbar back: Bony tenderness (L posterior hip bone) present.   Neurological:     General: No focal deficit present.     Mental Status: He is alert and oriented to person, place, and time.  Psychiatric:        Mood and Affect: Mood normal.        Behavior: Behavior normal.     LABORATORY DATA:  I have reviewed the labs as listed.  CBC Latest Ref Rng & Units 06/29/2020 04/26/2020 03/04/2020  WBC 4.0 - 10.5 K/uL 4.1 5.2 5.1  Hemoglobin 13.0 - 17.0 g/dL 9.8(L) 11.0(L) 9.2(L)  Hematocrit 39 - 52 % 32.6(L) 35.0(L) 29.5(L)  Platelets 150 - 400 K/uL 253 309 248   CMP Latest Ref Rng & Units 06/29/2020 04/26/2020 03/04/2020  Glucose 70 - 99 mg/dL 112(H) 165(H) 127(H)  BUN 8 - 23 mg/dL 31(H) 39(H) 25(H)  Creatinine 0.61 - 1.24 mg/dL 2.16(H) 1.94(H) 1.59(H)  Sodium 135 - 145 mmol/L 140 139 136  Potassium 3.5 - 5.1 mmol/L 4.5 4.8 4.1  Chloride 98 - 111 mmol/L 105 103 100  CO2 22 - 32 mmol/L 27 27 27   Calcium 8.9 - 10.3 mg/dL 9.2 10.1 9.1  Total Protein 6.5 - 8.1 g/dL 7.5 7.4 6.9  Total Bilirubin 0.3 - 1.2 mg/dL 0.4 0.3 0.5  Alkaline Phos 38 - 126 U/L 107 98 78  AST 15 - 41 U/L 16 27 16   ALT 0 - 44 U/L 17 30 13       Component Value Date/Time   RBC 3.07 (L) 06/29/2020 1442   MCV 106.2 (H) 06/29/2020 1442   MCH 31.9 06/29/2020 1442  MCHC 30.1 06/29/2020 1442   RDW 14.8 06/29/2020 1442   LYMPHSABS 0.9 06/29/2020 1442   MONOABS 0.5 06/29/2020 1442   EOSABS 0.3 06/29/2020 1442   BASOSABS 0.0 06/29/2020 1442   Lab Results  Component Value Date   LDH 128 06/29/2020   LDH 120 04/26/2020   LDH 111 03/04/2020    DIAGNOSTIC IMAGING:  I have independently reviewed the scans and discussed with the patient. MR Pelvis Wo Contrast  Result Date: 05/31/2020 CLINICAL DATA:  Diffuse large B-cell lymphoma.  Back pain. EXAM: MRI PELVIS WITHOUT CONTRAST TECHNIQUE: Multiplanar multisequence MR imaging of the pelvis was performed. No intravenous contrast was administered. COMPARISON:  PET-CT 04/26/2020.  CT biopsy study 05/12/2020. FINDINGS: Urinary Tract: Foley  catheter decompresses the urinary bladder. Bladder wall appears circumferentially thickened despite the decompressed state. Several bladder stones are evident. Bowel: No dilated small bowel or colon within the visualized pelvis. Vascular/Lymphatic: Flow signal in the major arterial and venous anatomy of the pelvis appears within normal limits on this noncontrast study. 6.2 x 4.4 x 3.9 cm soft tissue mass along the posterior left pelvic sidewall and presacral space corresponds to the abnormality biopsied on 05/12/2020. This lesion measures larger today than it did on that study from about 3 weeks ago. Reproductive:  Prostate gland is enlarged. Other: No intraperitoneal free fluid. There is some edema in the presacral soft tissues. Musculoskeletal: Heterogeneous marrow signal intensity noted diffusely.Posteriorly, the left pelvic soft tissue lesion can be seen involving the left S2 nerve root that appears mildly thickened and demonstrates abnormal signal. Fat plane anterior to the nerve root is obliterated (see axial T1 image 22/series 11) and the nerve root appears attenuated. There may be some minimal involvement/contact of the left S1 root peripherally. Left S3 nerve root loses the fat plane anteriorly on image 25/series 11. IMPRESSION: 1. 6.2 x 4.4 x 3.9 cm soft tissue mass along the posterior left pelvic sidewall and presacral space has increased slightly in size since 05/12/2020. Tumor extension posteriorly into the S2 foramen causes attenuation and abnormal signal in the S2 root consistent with tumor involvement. Tumor may minimally involve the S1 root peripherally and loss of fat plane over a short distance anterior to the S3 root raises concern for involvement. 2. Apparent circumferential bladder wall thickening despite decompression by Foley catheter. Bladder stones evident. Electronically Signed   By: Misty Stanley M.D.   On: 05/31/2020 10:03     ASSESSMENT:  1.  Stage IV DLBCL: -6 cycles of R mini  CHOP from 08/21/2019 through 01/21/2020. -PET scan on 04/26/2020 showed slight interval enlargement of hypermetabolic nodule in the deep left pelvis measuring 27 mm, SUV 10.4 increased in size and metabolic activity. 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 -MRI pelvis with 6.2 x 4.4 x 3.9 cm soft tissue mass along the posterior left pelvic sidewall and presacral space has increased slightly in size from 05/12/2020.  Tumor is extending into S2 foramen causing attenuation and abnormal signal in the S2 root consistent with tumor involvement.  Tumor may minimally involve S1 root and S3 to concerning for involvement. -XRT to the left pelvic nodule completed on 06/09/2020, 10 treatments.  2.  Pulmonary embolism: -Diagnosed with bilateral pulmonary embolism on 08/26/2019.  He is on Eliquis.   PLAN:  1.  Stage IV DLBCL: -He has completed radiation.  However he is continuing to have pain in the lower back and left hip region. -I have recommended  repeat pelvic MRI and PET scan in 3 weeks with labs.  2.  Pulmonary embolism: -Continue Eliquis.  No bleeding issues.  3.  Back pain: -He did not see any improvement in the pain after radiation.  He still has lower back pain radiating to the left leg.  He is able to ambulate with the help of walker.  4.  Urinary problems: -He has chronic indwelling Foley catheter and follows up with urology.  5.  Macrocytic anemia: -Combination anemia from CKD. -Hemoglobin today is 9.8 with MCV of 106.  LDH was 128.   Orders placed this encounter:  No orders of the defined types were placed in this encounter.    Derek Jack, MD [place] [phone]   I, Milinda Antis, am acting as a scribe for Dr. Sanda Linger.  I, Derek Jack MD, have reviewed the above documentation for accuracy and completeness, and I agree with the above.

## 2020-06-29 NOTE — Patient Instructions (Signed)
[place] at [facility] Discharge Instructions  You were seen today by Dr. Delton Coombes. He went over your recent results and scans. You will be scheduled for a PET scan and an MRI of your pelvis before your next visit. Dr. Delton Coombes will see you back in 3 weeks for labs and follow up.   Thank you for choosing [place] at [facility] to provide your oncology and hematology care.  To afford each patient quality time with our provider, please arrive at least 15 minutes before your scheduled appointment time.   If you have a lab appointment with the [facility] please come in thru the Main Entrance and check in at the main information desk  You need to re-schedule your appointment should you arrive 10 or more minutes late.  We strive to give you quality time with our providers, and arriving late affects you and other patients whose appointments are after yours.  Also, if you no show three or more times for appointments you may be dismissed from the clinic at the providers discretion.     Again, thank you for choosing [facility].  Our hope is that these requests will decrease the amount of time that you wait before being seen by our physicians.       _____________________________________________________________  Should you have questions after your visit to [facility], please contact our office at [phone] between the hours of 8:00 a.m. and 4:30 p.m.  Voicemails left after 4:00 p.m. will not be returned until the following business day.  For prescription refill requests, have your pharmacy contact our office and allow 72 hours.    Cancer Center Support Programs:   > Cancer Support Group  2nd Tuesday of the month 1pm-2pm, Journey Room

## 2020-07-05 ENCOUNTER — Telehealth (HOSPITAL_COMMUNITY): Payer: Self-pay | Admitting: *Deleted

## 2020-07-06 ENCOUNTER — Encounter: Payer: Self-pay | Admitting: General Practice

## 2020-07-06 ENCOUNTER — Other Ambulatory Visit (HOSPITAL_COMMUNITY): Payer: Self-pay

## 2020-07-06 ENCOUNTER — Encounter (HOSPITAL_COMMUNITY): Payer: Self-pay

## 2020-07-06 DIAGNOSIS — C8336 Diffuse large B-cell lymphoma, intrapelvic lymph nodes: Secondary | ICD-10-CM

## 2020-07-06 NOTE — Progress Notes (Signed)
[facility] CSW Progress Notes  Call to daughter [name] at request of Lora Havens, RN.  Daughter is concerned about patient's level of physical activity, depression, generally low energy level and decreased ability to function independently at home.  He is now living w his son "lays around all day in bed, now he has a catheter so does not need to get up at all."  Daughter feels he needs either more care at home or placement in a facility of some sort.  Reviewed options w daughter. Patient has Medicare A and B, will require qualifying inpatient stay in order to access his Medicare benefits for SNF placement for rehab.  His income appears to be over the limit for Medicaid in the community, but he may have high medical bills and thus may qualify.  Daughter advised to contact [facility] in order to pursue this option. Also advised that patient may have other options w Medicare including a different plan and/or Extra Help = provided contact information for [facility] so she can explore this option.  Requested that RN add [place]ia SW to [facility] orders so they can help facilitate placement if that is desired by patient and family.  Also suggested patient be reassessed by PCP - per daughter, he is having difficulty w memory -  He is currently on an antidepressant however he continues to struggle w difficulty eating, lack of energy, lack of motivation and similar.  Suggested PCP may need to adjust treatment to address any possible issues w clinical depression.  Also noted that patient has been assess by Douglass Rivers DO from palliative care program - could receive support from palliative care program.  Will recontact daughter in two weeks to reassess progress.  Edwyna Shell, LCSW Clinical Social Worker Phone:  [phone] Cell:  [phone]

## 2020-07-06 NOTE — Telephone Encounter (Signed)
Received call from pt's daughter, Tammie.  She reports that Daniel Richards is currently living with her brother.  States he is not getting up to do anything, and that "he just wants to lie around and sleep all the time".  She reports that he has to be forced to get up to do anything.  She also reports that Daniel Richards has had several falls over the past couple of weeks.  She is interested in getting him rehab in a SNF, or either having home health referral for PT/OT and to "have a nurse that can come check on him a couple times a week".  She is also inquiring about obtaining Medicaid for him.  I told her I would speak to our social worker, Edwyna Shell, and have her reach out to her tomorrow, and that we can refer him to home health for the above services.

## 2020-07-06 NOTE — Progress Notes (Signed)
[facility] referral sent to [place]. Patient accepted by [name], RN with [facility].

## 2020-07-08 ENCOUNTER — Telehealth (HOSPITAL_COMMUNITY): Payer: Self-pay | Admitting: *Deleted

## 2020-07-08 NOTE — Telephone Encounter (Signed)
Verbal order given by Dr. Delton Coombes for patient to start PT with Verl Dicker from [facility]. Patient will have PT three times a week for 9 weeks.

## 2020-07-13 ENCOUNTER — Encounter: Payer: Self-pay | Admitting: Urology

## 2020-07-13 ENCOUNTER — Ambulatory Visit (INDEPENDENT_AMBULATORY_CARE_PROVIDER_SITE_OTHER): Payer: Medicare Other | Admitting: Urology

## 2020-07-13 ENCOUNTER — Other Ambulatory Visit: Payer: Self-pay

## 2020-07-13 ENCOUNTER — Telehealth: Payer: Self-pay

## 2020-07-13 VITALS — BP 127/72 | HR 81 | Temp 98.4°F | Ht 71.5 in | Wt 180.0 lb

## 2020-07-13 DIAGNOSIS — R339 Retention of urine, unspecified: Secondary | ICD-10-CM | POA: Diagnosis not present

## 2020-07-13 NOTE — Progress Notes (Signed)
H&P  Chief Complaint: Catheter Change  History of Present Illness:  9.7.2021: Pt presents in poor health condition due to lymphoma. He is having worsening pelvic bone pain. Pt reports no issues with his catheter and denies any recent UTI's or gross hematuria. Pt submits no questions or concerns at this time.  (below copied from [facility] records):  1.12.2021:This patient presents today having had recurrent urinary tract infections for which he was being treated over the course of a month (Sept 2020), but following a heavy fall while trying to urinate he was put on indwelling catheter. Shortly after, he was diagnosed with lymphoma and has been receiving treatment (he has had multiple chemo treatments and is due one this week). During a hospital visit, he also developed c.diff and between this and his recurrent UTI's he has been on many courses of abx's. 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 Prior to onset of this recent episode of cystitis, he was not having many urinary complaints aside from high urinary freq and weakened stream. His cath was last changed 2 weeks ago. At this change, he had a neg UA -- this was ordered because he had some cognitive impairment which is apparently typical for him when infected. Additionally of note, the days following chemo treatment he reports having cloudy/"slimy" looking urine. He has never been seen by a urologist prior to this and denies ever having had a prostate check. He tolerates his foley cath very well but this is apparently taxing on his family providing care for him.  1.19.2021:He returns today for follow-up. He has been on tamsulosin since his last visit. This has not caused significant side effects.  3.16.2021: Here today for follow-up still on indwelling catheter. On 3.11.2021, he was hospitalized for sepsis -- treated for UTI and pneumonia and discharged 3.14.2021. His cath was last  changed a few weeks ago. He is tolerating his catheter quite well and is not currently experiencing any discomfort with this.   Past Medical History:  Diagnosis Date  . Acid reflux   . Anxiety and depression   . Cancer ([place])    lymphoma  . Depression   . Diabetes ([place])   . Diabetes mellitus 06/09/2011   pt taking metformin  . DJD of shoulder    right   . Hyperlipidemia   . Hypertension   . Lymphoma ([place])   . Migraines   . Port-A-Cath in place 08/13/2019  . Pulmonary embolism ([place])   . Stomach ulcer   . Tremor   . Tremor   . Vitamin D deficiency     Past Surgical History:  Procedure Laterality Date  . BACK SURGERY    . KNEE SURGERY    . PORTACATH PLACEMENT Left 08/08/2019   Procedure: INSERTION PORT-A-CATH;  Surgeon: Aviva Signs, MD;  Location: [facility];  Service: General;  Laterality: Left;  . SHOULDER SURGERY      Home Medications:  Allergies as of 07/13/2020      Reactions   Vancomycin Hives   Patient received Benadryl to treat the hives      Medication List       Accurate as of July 13, 2020 12:46 PM. If you have any questions, ask your nurse or doctor.        allopurinol 300 MG tablet Commonly known as: ZYLOPRIM Take 1 tablet (300 mg total) by mouth daily.   Benefiber Powd Take as directed on package.   calcitRIOL  0.5 MCG capsule Commonly known as: ROCALTROL Take 1 capsule (0.5 mcg total) by mouth daily.   citalopram 20 MG tablet Commonly known as: CELEXA Take 1 tablet (20 mg total) by mouth at bedtime. What changed: how much to take   Cranberry Plus Probiotic Tabs Take as directed on package.   cyclobenzaprine 10 MG tablet Commonly known as: FLEXERIL Take 10 mg by mouth at bedtime.   CYCLOPHOSPHAMIDE IV Inject into the vein every 21 ( twenty-one) days.   DOXORUBICIN HCL IV Inject into the vein every 21 ( twenty-one) days.   Eliquis 5 MG Tabs tablet Generic drug: apixaban Take 5 mg by mouth 2 (two) times daily.   folic acid 1 MG  tablet Commonly known as: FOLVITE Take 1 tablet (1 mg total) by mouth daily.   gabapentin 300 MG capsule Commonly known as: NEURONTIN Take 2 capsules (600 mg total) by mouth 3 (three) times daily.   GaviLAX 17 GM/SCOOP powder Generic drug: polyethylene glycol powder Take 17 g by mouth daily as needed for mild constipation or moderate constipation.   HYDROcodone-acetaminophen 10-325 MG tablet Commonly known as: NORCO Take 1 tablet by mouth every 6 (six) hours as needed for moderate pain or severe pain.   loperamide 2 MG capsule Commonly known as: IMODIUM SMARTSIG:1 Capsule(s) By Mouth 1 to 4 Times Daily PRN   meclizine 25 MG tablet Commonly known as: ANTIVERT Take 25 mg by mouth daily as needed.   metFORMIN 500 MG tablet Commonly known as: GLUCOPHAGE Take 1 tablet (500 mg total) by mouth daily.   pantoprazole 40 MG tablet Commonly known as: PROTONIX Take 1 tablet (40 mg total) by mouth 2 (two) times daily before a meal.   primidone 50 MG tablet Commonly known as: MYSOLINE Take 1.5 tablets (75 mg total) by mouth every 8 (eight) hours. What changed:   how much to take  when to take this   propranolol 40 MG tablet Commonly known as: INDERAL Take 1 tablet (40 mg total) by mouth 2 (two) times daily.   riTUXimab in sodium chloride 0.9 % 250 mL Inject into the vein every 21 ( twenty-one) days.   temazepam 15 MG capsule Commonly known as: RESTORIL Take 1 capsule (15 mg total) by mouth at bedtime.   topiramate 100 MG tablet Commonly known as: TOPAMAX Take 2 tablets (200 mg total) by mouth at bedtime.   vinCRIStine 2 mg in sodium chloride 0.9 % 50 mL Inject 2 mg into the vein every 21 ( twenty-one) days.       Allergies:  Allergies  Allergen Reactions  . Vancomycin Hives    Patient received Benadryl to treat the hives    Family History  Problem Relation Age of Onset  . Cancer Sister   . Cancer Sister   . Cancer Brother   . Heart attack Father   . Cancer  Brother   . Heart attack Brother   . Healthy Son   . Healthy Daughter   . Migraines Neg Hx     Social History:  reports that he quit smoking about 30 years ago. His smoking use included cigarettes. He has a 40.00 pack-year smoking history. He has never used smokeless tobacco. He reports that he does not drink alcohol and does not use drugs.  ROS: A complete review of systems was performed.  All systems are negative except for pertinent findings as noted.  Physical Exam:  Vital signs in last 24 hours: There were no vitals taken for  this visit. Constitutional:  Alert and oriented, No acute distress. In wheelchair. 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 F/U NV q 6 weeks for catheter change. Pt will call if he has any concerns in the interim.  2. F/U in 6 months for OV.  CC: Loletha Grayer FNP

## 2020-07-13 NOTE — Progress Notes (Signed)

## 2020-07-13 NOTE — Progress Notes (Signed)
Cath Change/ Replacement  Patient is present today for a catheter change due to urinary retention.  25ml of water was removed from the balloon, a 16FR foley cath was removed with out difficulty.  Patient was cleaned and prepped in a sterile fashion with betadine. A 16 FR foley cath was replaced into the bladder no complications were noted Urine return was noted 15ccml and urine was yellow in color. The balloon was filled with 44ml of sterile water. A bed bag was attached for drainage. Patient was given instruction on how to change from one bag to another. Patient was given proper instruction on catheter care.    Performed by: Valentina Lucks ,LPN

## 2020-07-13 NOTE — Telephone Encounter (Signed)
Called and spoke with patient's daughter [name] to see how patient was fairing since completing radiation last month. Tammy stated that patient's overall condition seems to have deteriorated. She reports he falls frequently, and is now to the point where he is non-ambulatory and needs total assistance to move around. Patient is scheduled for an MRI as well as PET/CT on 07/19/20, but Tammy is curious whether or not patient should have an x-ray or some other imaging to see if he's sustained a fracture to his left foot or leg because he's in a lot of pain when he attempts to put weight on it. She states that when she took him for his urology appointment today to exchange his indwelling catheter, it was very difficult for both patient and herself (physically and mentally). He lives with his son (Tammy's brother) who is also not in good health, and therefore often has to call EMS if the patient sustains a fall or cannot be moved from where he lays. Tammy mentions that patient's condition is now similar to when he was initially diagnosed with lymphoma. It then got better after chemotherapy, but has once again deteriorated. She seemed interested in discussing Palliative care involvement, but ultimately wants to talk with Dr. Isidore Moos or Dr. Delton Coombes about options/best course of action for patient.   Informed Tammy that I would update both Dr. Isidore Moos and Dr. Delton Coombes, and that Dr. Isidore Moos would call her tomorrow at patient's F/U appointment time to speak with her personally. Tammy verbalized understanding and agreement with plan, and denied any other needs at this time.

## 2020-07-14 ENCOUNTER — Ambulatory Visit (HOSPITAL_COMMUNITY)
Admission: RE | Admit: 2020-07-14 | Discharge: 2020-07-14 | Disposition: A | Discharge status: Home / Self Care | Payer: Medicare Other | Source: Ambulatory Visit | Attending: Hematology | Admitting: Hematology

## 2020-07-14 ENCOUNTER — Telehealth: Payer: Self-pay | Admitting: Nurse Practitioner

## 2020-07-14 ENCOUNTER — Other Ambulatory Visit (HOSPITAL_COMMUNITY): Payer: Self-pay

## 2020-07-14 ENCOUNTER — Ambulatory Visit
Admission: RE | Admit: 2020-07-14 | Discharge: 2020-07-14 | Disposition: A | Discharge status: Home / Self Care | Payer: Medicare Other | Source: Ambulatory Visit | Attending: Radiation Oncology | Admitting: Radiation Oncology

## 2020-07-14 DIAGNOSIS — C8513 Unspecified B-cell lymphoma, intra-abdominal lymph nodes: Secondary | ICD-10-CM

## 2020-07-14 DIAGNOSIS — C8586 Other specified types of non-Hodgkin lymphoma, intrapelvic lymph nodes: Secondary | ICD-10-CM

## 2020-07-14 DIAGNOSIS — C8336 Diffuse large B-cell lymphoma, intrapelvic lymph nodes: Secondary | ICD-10-CM

## 2020-07-14 NOTE — Progress Notes (Signed)
Xray orders placed for L hip and L knee xray per Dr. Tomie China verbal order.

## 2020-07-14 NOTE — Progress Notes (Signed)
Radiation Oncology         [phone] ________________________________  Name: CAYDAN MCTAVISH MRN: [id number]  Date: 07/14/2020  DOB: 29-Sep-1946  Follow-Up Visit Note  Outpatient, by telephone.  The patient opted for telemedicine to maximize safety during the pandemic.  MyChart video was not obtainable.   CC: [name], FNP  [facility], PA-C  Diagnosis and Prior Radiotherapy:    ICD-10-CM   1. B-cell lymphoma of intra-abdominal lymph nodes, unspecified B-cell lymphoma type ([place])  C85.13   2. Other specified types of non-hodgkin lymphoma, intrapelvic lymph nodes ([facility])  C85.86 Amb Referral to Palliative Care  3. 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  He is struggling.  She reports that he cannot walk due to deconditioning and pain.  He has had multiple falls.  His son cares for him but has his own medical issues and has difficulty assisting him physically.  They have had to call EMS after at least one of his falls..    PT comes to the house for 15-20 min day, "but pt is not motivated at all to do anything" per daughter.   ALLERGIES:  is allergic to vancomycin.  Meds: Current Outpatient Medications  Medication Sig Dispense Refill  . allopurinol (ZYLOPRIM) 300 MG tablet Take 1 tablet (300 mg total) by mouth daily. 30 tablet 0  . calcitRIOL (ROCALTROL) 0.5 MCG capsule Take 1 capsule (0.5 mcg total) by mouth daily. 30 capsule 0  . citalopram (CELEXA) 20 MG tablet Take 1 tablet (20 mg total) by mouth at bedtime. (Patient taking differently: Take 40 mg by mouth at bedtime. ) 30 tablet 0  . Cranberry-Vit C-Probiotic-Ca (CRANBERRY PLUS PROBIOTIC) TABS Take as directed on package. 30 tablet 12  . cyclobenzaprine (FLEXERIL) 10 MG tablet Take 10 mg by mouth at bedtime.    . CYCLOPHOSPHAMIDE IV Inject into  the vein every 21 ( twenty-one) days.     Marland Kitchen DOXORUBICIN HCL IV Inject into the vein every 21 ( twenty-one) days.     Marland Kitchen ELIQUIS 5 MG TABS tablet Take 5 mg by mouth 2 (two) times daily.    . folic acid (FOLVITE) 1 MG tablet Take 1 tablet (1 mg total) by mouth daily. 30 tablet 0  . gabapentin (NEURONTIN) 300 MG capsule Take 2 capsules (600 mg total) by mouth 3 (three) times daily. 90 capsule 0  . GAVILAX 17 GM/SCOOP powder Take 17 g by mouth daily as needed for mild constipation or moderate constipation.     Marland Kitchen HYDROcodone-acetaminophen (NORCO) 10-325 MG tablet Take 1 tablet by mouth every 6 (six) hours as needed for moderate pain or severe pain.     Marland Kitchen loperamide (IMODIUM) 2 MG capsule SMARTSIG:1 Capsule(s) By Mouth 1 to 4 Times Daily PRN    . meclizine (ANTIVERT) 25 MG tablet Take 25 mg by mouth daily as needed.    . metFORMIN (GLUCOPHAGE) 500 MG tablet Take 1 tablet (500 mg total) by mouth daily. 30 tablet 0  . pantoprazole (PROTONIX) 40 MG tablet Take 1 tablet (40 mg total) by mouth 2 (two) times daily before a meal. 60 tablet 0  . primidone (MYSOLINE) 50 MG tablet Take 1.5 tablets (75 mg total) by mouth every 8 (eight) hours. (Patient taking differently: Take 100 mg by mouth 2 (two) times  daily. ) 150 tablet 0  . propranolol (INDERAL) 40 MG tablet Take 1 tablet (40 mg total) by mouth 2 (two) times daily. 60 tablet 0  . riTUXimab in sodium chloride 0.9 % 250 mL Inject into the vein every 21 ( twenty-one) days.     . temazepam (RESTORIL) 15 MG capsule Take 1 capsule (15 mg total) by mouth at bedtime. 15 capsule 0  . topiramate (TOPAMAX) 100 MG tablet Take 2 tablets (200 mg total) by mouth at bedtime. 60 tablet 0  . vinCRIStine 2 mg in sodium chloride 0.9 % 50 mL Inject 2 mg into the vein every 21 ( twenty-one) days.     . Wheat Dextrin (BENEFIBER) POWD Take as directed on package. 1 g 0   No current facility-administered medications for this encounter.    Physical Findings:    vitals were not  taken for this visit. .      Lab Findings: Lab Results  Component Value Date   WBC 4.1 06/29/2020   HGB 9.8 (L) 06/29/2020   HCT 32.6 (L) 06/29/2020   MCV 106.2 (H) 06/29/2020   PLT 253 06/29/2020    Radiographic Findings: DG Knee Complete 4 Views Left  Result Date: 07/14/2020 CLINICAL DATA:  Left knee pain.  Recurrent falls. EXAM: LEFT KNEE - COMPLETE 4+ VIEW COMPARISON:  None. FINDINGS: Mild tricompartmental peripheral spurring. The joint spaces are preserved. Trace joint effusion. There is a 3 cm well-defined osseous projection from the lateral femoral metaphysis directed away from the joint with cortical and medullary continuity consistent with osteochondroma. No fracture, erosion or bony destruction. IMPRESSION: 1. Mild tricompartmental osteoarthritis with trace joint effusion. No acute fracture. 2. Elongated 3 cm osteochondroma from the lateral femur. In the absence of focal pain in this region, no dedicated further imaging is needed. If there is focal pain, consider further evaluation with MRI. Electronically Signed   By: Keith Rake M.D.   On: 07/14/2020 22:27   DG HIP UNILAT WITH PELVIS 2-3 VIEWS LEFT  Result Date: 07/14/2020 CLINICAL DATA:  Left hip pain.  Recurrent falls. EXAM: DG HIP (WITH OR WITHOUT PELVIS) 2-3V LEFT COMPARISON:  None. FINDINGS: Mild osteoarthritis of the left hip with minimal joint space narrowing and femoral head spurring. Mild right hip osteoarthritis also seen. Pubic rami are intact. The sacroiliac joints are congruent. There is no evidence of fracture, avascular necrosis, focal bone lesion or bony destruction. Soft tissues are unremarkable. IMPRESSION: Mild osteoarthritis of the left hip. Electronically Signed   By: Keith Rake M.D.   On: 07/14/2020 22:24    Impression/Plan: After a lengthy discussion with the patient's daughter, it seems that the patient and his family would benefit from a reevaluation by palliative care.  I spoke with Dr. Hilma Favors  today, who is familiar with this case.  She and I agree that it is appropriate for [facility] to make a home visit ASAP to escalate his palliative services and also prepare for a possible hospice initiation if needed in the near future.  The patient will have a restaging PET scan next week which will probably drive their future decisions.  The patient's daughter was relieved and appreciative about our conversation and agrees with everything above.  I will see him back on an as-needed basis.  He follows up next week with medical oncology after his PET scan.  This encounter was provided by telemedicine platform by telephone.  The patient opted for telemedicine to maximize safety during the pandemic.  MyChart  video was not obtainable. The patient has given verbal consent for this type of encounter and has been advised to only accept a meeting of this type in a secure network environment. The attendants for this meeting include Eppie Gibson  and Asencion Partridge.  During the encounter, Eppie Gibson was located at [facility].  CARSON BOGDEN was located at home.  On date of service, in total, I spent 30 minutes on this encounter. Patient was seen in person.  _____________________________________   Eppie Gibson, MD

## 2020-07-14 NOTE — Telephone Encounter (Signed)
Scheduled [facility] visit for 07-16-20 at 1:00.

## 2020-07-14 NOTE — Progress Notes (Signed)
L foot xray ordered per Dr. Tomie China verbal order.

## 2020-07-14 NOTE — Telephone Encounter (Signed)
Phone call placed to patient to offer to schedule a visit with [facility]. Phone rang, with no answer I left a voicemail for call back. 

## 2020-07-16 ENCOUNTER — Other Ambulatory Visit: Payer: Medicare Other | Admitting: Nurse Practitioner

## 2020-07-16 ENCOUNTER — Encounter: Payer: Self-pay | Admitting: Radiation Oncology

## 2020-07-16 ENCOUNTER — Encounter: Payer: Self-pay | Admitting: Nurse Practitioner

## 2020-07-16 ENCOUNTER — Other Ambulatory Visit: Payer: Self-pay

## 2020-07-16 DIAGNOSIS — C801 Malignant (primary) neoplasm, unspecified: Secondary | ICD-10-CM

## 2020-07-16 DIAGNOSIS — Z515 Encounter for palliative care: Secondary | ICD-10-CM

## 2020-07-16 NOTE — Progress Notes (Signed)
[place] Consult Note Telephone: [phone]  Fax: [phone]  PATIENT NAME: Daniel Richards [address] [place] [address] [id number] (home)  DOB: [birth year] MRN: [id number]  PRIMARY CARE PROVIDER:    [name], FNP,  [place] [facility] [address] [place] [address] [phone]  REFERRING PROVIDER:   [name], FNP [facility] [address] [place],  [place] [address] [phone]  RESPONSIBLE PARTY:   Extended Emergency Contact Information Primary Emergency Contact: Daniel Richards,Daniel Richards Address: [address], [place] Phone: [phone] Relation: Daughter Secondary Emergency Contact: Daniel Richards,Daniel Richards Address: [address] [place] [address] [place] Phone: [phone] Mobile Phone: [phone] Relation: Son  I met face to face with patient and family in home.  ASSESSMENT AND RECOMMENDATIONS:   1. Advance Care Planning/Goals of Care: Goals include to maximize quality of life and symptom management. Our advance care planning conversation included a discussion about:     The value and importance of advance care planning   Experiences with loved ones who have been seriously ill or have died   Exploration of personal, cultural or spiritual beliefs that might influence medical decisions   Exploration of goals of care in the event of a sudden injury or illness   Review and updating or creation of an  advance directive document . Visit consisted of building trust and discussions on Palliative care medicine as specialized medical care for people living with serious illness, aimed at facilitating better quality of life through symptoms relief, assisting with advance care plan and establishing goals of care. Patient's daughter Daniel Richards and son Daniel Richards present  at visit.  Patient and family expressed appreciation for education provided on Palliative care and how that differs from Hospice service. Palliative care will continue to provide support to patient, family and the medical team.   Code Status: After discussions on ramifications and implications of code status, patient elected to be a full Code. Patient reiterated desire for full spectrum of care including mechanical ventilation in the event of cardiac or respiratory arrest. Validation provided. Patient was made aware that code status will be regularly reviewed to ensure that it reflects his wishes. Patient and family open to further discussion on code status and possible changes in the future.   Advance directives: Discussed and reviewed need for completion of a MOST form. Educational material and a blank copy of MOST form left with patient and family to review, will discuss more at next visit.  2. 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 Received chemotherapy and recently radiation to his pelvis. Patient has indwelling foley catheter, cath changed 07/13/2020. Patient has been having ongoing weakness with resultant falls. Family report 5-6 falls in the last month, last fall 2 days ago, no injury reported. Patient endorsed unawareness of cause of falls, often not remembering how he fell. No pattern of fall reported, pt falls mostly in kitchen and sometimes in bathroom. Son Daniel Richards unable to lift patient off floor when he falls. Family expressed concerns about patients saftey and inability to care for self as family unable to stay with patient 24/7. Patient currently receives in-home PT and OT. Family report good outcome when patient was  in rehab after last hospitalization for sepsis in March 2021. Report patient was stronger and apeared happier as he was with other people unlike current situation of loneliness. Family requesting guidance on possible nursing  home/assisted living placement. I will reach out o Air traffic controller for support.  Hypotension: Patient hypotensive on exam today. Patient and family endorsed good appetite and oral intake. BP sitting 96/56, HR 75, standing BP 96/60 HR 121. Patient on Propanolol 44m PO BID. Patient report taking propanolol for tremors. Patient with unsteady gait. I'll reach out to his PCP Dr. NLorel Richards regarding need to possibly decrease his dose.  3. Cognitive / Functional decline: Patient finishing session with OT at the time of today's visit, has very unsteady gait. Family report patient needing assistance completing ADLs but able to feed self. Uses front wheeled worker for ambulation. He is alert and oriented and able to make his own decisions. Family report some forgetfulness.  4. Follow up Palliative Care Visit: Palliative care will continue to follow for goals of care clarification and symptom management. Return 4 weeks or prn.  5. Family /Caregiver/Community Supports: Patient lives in his home with son Daniel Richards Daniel Richards assist in his care. Daughter Daniel Smokeoverseas his medical appointments and finances.  I spent 90 minutes providing this consultation, time includes time spent with patient/family, chart review, provider coordination, and documentation. More than 50% of the time in this consultation was spent coordinating communication.   CHIEF COMPLAINT:  HISTORY OF PRESENT ILLNESS:  HCHRISTIANO BLANDONis a 74y.o. year old male with multiple medical problems including B-cell Lymphoma, CAD, DM2, essential tremor, BPH. Patient completed radiation to pelvis, repeat CAT scan and MRI scheduled next week. Patient was hospitalized on 01/15/2020 for sepsis secondary to UTI and pneumonia. Palliative Care was asked to follow this patient by consultation request of Daniel Richards*to help address advance care planning and goals of care. Palliative Care was asked to help address goals of care. This is an initial  visit.  CODE STATUS: Full Code  PPS: 50%  HOSPICE ELIGIBILITY/DIAGNOSIS: TBD  PAST MEDICAL HISTORY:  Past Medical History:  Diagnosis Date  . Acid reflux   . Anxiety and depression   . [place] ([facility]    lymphoma  . Depression   . [place] ([facility]   . Diabetes mellitus 06/09/2011   pt taking metformin  . DJD of shoulder    right   . Hyperlipidemia   . Hypertension   . [place]oma ([facility]   . Migraines   . Port-A-Cath in place 08/13/2019  . [place]ism ([facility]   . Stomach ulcer   . Tremor   . Tremor   . Vitamin D deficiency     SOCIAL HX:  Social History   Tobacco Use  . Smoking status: Former Smoker    Packs/day: 2.00    Years: 20.00    Pack years: 40.00    Types: Cigarettes    Quit date: 11/17/1989    Years since quitting: 30.6  . Smokeless tobacco: Never Used  Substance Use Topics  . Alcohol use: No   FAMILY HX:  Family History  Problem Relation Age of Onset  . Cancer Sister   . Cancer Sister   . Cancer Brother   . Heart attack Father   . Cancer Brother   . Heart attack Brother   . Healthy Son   . Healthy Daughter   . Migraines Neg Hx     ALLERGIES:  Allergies  Allergen Reactions  .  Vancomycin Hives    Patient received Benadryl to treat the hives     PERTINENT MEDICATIONS:  Outpatient Encounter Medications as of 07/16/2020  Medication Sig  . allopurinol (ZYLOPRIM) 300 MG tablet Take 1 tablet (300 mg total) by mouth daily.  . calcitRIOL (ROCALTROL) 0.5 MCG capsule Take 1 capsule (0.5 mcg total) by mouth daily.  . citalopram (CELEXA) 20 MG tablet Take 1 tablet (20 mg total) by mouth at bedtime. (Patient taking differently: Take 40 mg by mouth at bedtime. )  . Cranberry-Vit C-Probiotic-Ca (CRANBERRY PLUS PROBIOTIC) TABS Take as directed on package.  . cyclobenzaprine (FLEXERIL) 10 MG tablet Take 10 mg by mouth at bedtime.  . CYCLOPHOSPHAMIDE IV Inject into the vein every 21 ( twenty-one) days.   Marland Kitchen DOXORUBICIN HCL IV Inject into the vein every 21 (  twenty-one) days.   Marland Kitchen ELIQUIS 5 MG TABS tablet Take 5 mg by mouth 2 (two) times daily.  . folic acid (FOLVITE) 1 MG tablet Take 1 tablet (1 mg total) by mouth daily.  Marland Kitchen gabapentin (NEURONTIN) 300 MG capsule Take 2 capsules (600 mg total) by mouth 3 (three) times daily.  Marland Kitchen GAVILAX 17 GM/SCOOP powder Take 17 g by mouth daily as needed for mild constipation or moderate constipation.   Marland Kitchen HYDROcodone-acetaminophen (NORCO) 10-325 MG tablet Take 1 tablet by mouth every 6 (six) hours as needed for moderate pain or severe pain.   Marland Kitchen loperamide (IMODIUM) 2 MG capsule SMARTSIG:1 Capsule(s) By Mouth 1 to 4 Times Daily PRN  . meclizine (ANTIVERT) 25 MG tablet Take 25 mg by mouth daily as needed.  . metFORMIN (GLUCOPHAGE) 500 MG tablet Take 1 tablet (500 mg total) by mouth daily.  . pantoprazole (PROTONIX) 40 MG tablet Take 1 tablet (40 mg total) by mouth 2 (two) times daily before a meal.  . primidone (MYSOLINE) 50 MG tablet Take 1.5 tablets (75 mg total) by mouth every 8 (eight) hours. (Patient taking differently: Take 100 mg by mouth 2 (two) times daily. )  . propranolol (INDERAL) 40 MG tablet Take 1 tablet (40 mg total) by mouth 2 (two) times daily.  . riTUXimab in sodium chloride 0.9 % 250 mL Inject into the vein every 21 ( twenty-one) days.   . temazepam (RESTORIL) 15 MG capsule Take 1 capsule (15 mg total) by mouth at bedtime.  . topiramate (TOPAMAX) 100 MG tablet Take 2 tablets (200 mg total) by mouth at bedtime.  . vinCRIStine 2 mg in sodium chloride 0.9 % 50 mL Inject 2 mg into the vein every 21 ( twenty-one) days.   . Wheat Dextrin (BENEFIBER) POWD Take as directed on package.   No facility-administered encounter medications on file as of 07/16/2020.    PHYSICAL EXAM / ROS:   Current and past weights: Stable at 180lbs, Ht 3f1.5". BMI 24.8kg/m2 General: frail appearing, coherent and cooperative. Sitting in recliner in NAD Cardiovascular: denied chest pain, no edema, S1S2 normal Pulmonary: no  cough, no increased SOB, room air Abdomen: appetite good, denied constipation, continent of bowel GU: denies dysuria, Foley cath in place, urine clear and yellow MSK:  no joint and ROM abnormalities, ambulatory Skin: no rashes or wounds reported or noted on exposed skin Neurological: weakness, but otherwise nonfocal  QJari Favre DNP, AGPCNP-BC

## 2020-07-19 ENCOUNTER — Other Ambulatory Visit: Payer: Self-pay

## 2020-07-19 ENCOUNTER — Other Ambulatory Visit (HOSPITAL_COMMUNITY): Payer: Self-pay

## 2020-07-19 ENCOUNTER — Other Ambulatory Visit (HOSPITAL_COMMUNITY): Payer: Self-pay | Admitting: Hematology

## 2020-07-19 ENCOUNTER — Ambulatory Visit (HOSPITAL_COMMUNITY)
Admission: RE | Admit: 2020-07-19 | Discharge: 2020-07-19 | Disposition: A | Discharge status: Home / Self Care | Payer: Medicare Other | Source: Ambulatory Visit | Attending: Hematology | Admitting: Hematology

## 2020-07-19 ENCOUNTER — Encounter (HOSPITAL_COMMUNITY)
Admission: RE | Admit: 2020-07-19 | Discharge: 2020-07-19 | Disposition: A | Discharge status: Home / Self Care | Payer: Medicare Other | Source: Ambulatory Visit | Attending: Hematology | Admitting: Hematology

## 2020-07-19 DIAGNOSIS — C7951 Secondary malignant neoplasm of bone: Secondary | ICD-10-CM | POA: Insufficient documentation

## 2020-07-19 DIAGNOSIS — C8336 Diffuse large B-cell lymphoma, intrapelvic lymph nodes: Secondary | ICD-10-CM | POA: Insufficient documentation

## 2020-07-19 DIAGNOSIS — R599 Enlarged lymph nodes, unspecified: Secondary | ICD-10-CM

## 2020-07-19 LAB — GLUCOSE, CAPILLARY: Glucose-Capillary: 113 mg/dL — ABNORMAL HIGH (ref 70–99)

## 2020-07-19 MED ORDER — GADOBUTROL 1 MMOL/ML IV SOLN
8.0000 mL | Freq: Once | INTRAVENOUS | Status: AC | PRN
  Administered 2020-07-19: 8 mL via INTRAVENOUS

## 2020-07-19 MED ORDER — FLUDEOXYGLUCOSE F - 18 (FDG) INJECTION
8.8000 | Freq: Once | INTRAVENOUS | Status: AC | PRN
  Administered 2020-07-19: 8.8 via INTRAVENOUS

## 2020-07-19 NOTE — Progress Notes (Signed)
Verbal order received for MRI pelvis with/without contrast.

## 2020-07-20 ENCOUNTER — Inpatient Hospital Stay (HOSPITAL_COMMUNITY): Payer: Medicare Other | Attending: Hematology | Admitting: General Practice

## 2020-07-20 DIAGNOSIS — C8586 Other specified types of non-Hodgkin lymphoma, intrapelvic lymph nodes: Secondary | ICD-10-CM

## 2020-07-20 NOTE — Progress Notes (Signed)
[facility] CSW Progress Notes  Call to daughter to review progress.  Daughter has been in contact w [place] re CAP program - referral for CAP services has been sent to [facility] for review.  Daughter aware that palliative services have made home visit.  She believes "they are going to help get him into some kind of living facility."  Patient having frequent falls in the home.  Increasing difficulty w patient being cared for at home.  "I really don't know what to do know, just waiting for them to call me about his living situation."  Gets [facility] PT, OT, RN from [place].  "He seems to just have kind of given up."  "Acts like he doesn't understand anything, he is not motivated."  Patient was initially placed at [facility] and then at [facility].  Daughter aware he cannot live by himself.  Son, [name], "is in no better shape than Daddy" and is working on plans to move into his own place.  Patient would then be living on his own, daughter is also unable to care for him at her home.   Discussed need for guidance from oncologist, based on recent scans, to determine medical situation and get oncologists guidance.  CSW left VM for Traci Ritchie, [facility] CSW - she has been in contact w daughter to discuss resources/options.  Per notes, [facility] will also reach out to their social worker for assistance.    CSW will advise treatment team of process for seeking SNF bed under Medicaid.  Spoke w admissions at [facility] [phone]), they are willing to review an FL2 to determine if they can meet his needs.  Facility would work w family to help w process of applying for Medicaid for SNF level of care.  CSW will follow up w daughter next week to determine progress.  Edwyna Shell, LCSW Clinical Social Worker Phone:  [phone]

## 2020-07-22 ENCOUNTER — Encounter (HOSPITAL_COMMUNITY): Payer: Self-pay | Admitting: Hematology

## 2020-07-22 ENCOUNTER — Encounter (HOSPITAL_COMMUNITY): Payer: Self-pay

## 2020-07-22 ENCOUNTER — Other Ambulatory Visit (HOSPITAL_COMMUNITY): Payer: Self-pay

## 2020-07-22 ENCOUNTER — Inpatient Hospital Stay (HOSPITAL_COMMUNITY): Payer: Medicare Other

## 2020-07-22 ENCOUNTER — Inpatient Hospital Stay (HOSPITAL_BASED_OUTPATIENT_CLINIC_OR_DEPARTMENT_OTHER): Payer: Medicare Other | Admitting: Hematology

## 2020-07-22 ENCOUNTER — Other Ambulatory Visit: Payer: Self-pay

## 2020-07-22 VITALS — BP 97/50 | HR 82 | Temp 97.1°F | Resp 18 | Wt 175.7 lb

## 2020-07-22 DIAGNOSIS — Z809 Family history of malignant neoplasm, unspecified: Secondary | ICD-10-CM | POA: Diagnosis not present

## 2020-07-22 DIAGNOSIS — R630 Anorexia: Secondary | ICD-10-CM | POA: Insufficient documentation

## 2020-07-22 DIAGNOSIS — Z923 Personal history of irradiation: Secondary | ICD-10-CM | POA: Insufficient documentation

## 2020-07-22 DIAGNOSIS — E559 Vitamin D deficiency, unspecified: Secondary | ICD-10-CM | POA: Diagnosis not present

## 2020-07-22 DIAGNOSIS — E1122 Type 2 diabetes mellitus with diabetic chronic kidney disease: Secondary | ICD-10-CM | POA: Diagnosis not present

## 2020-07-22 DIAGNOSIS — Z87891 Personal history of nicotine dependence: Secondary | ICD-10-CM | POA: Diagnosis not present

## 2020-07-22 DIAGNOSIS — Z8711 Personal history of peptic ulcer disease: Secondary | ICD-10-CM | POA: Diagnosis not present

## 2020-07-22 DIAGNOSIS — Z86718 Personal history of other venous thrombosis and embolism: Secondary | ICD-10-CM | POA: Insufficient documentation

## 2020-07-22 DIAGNOSIS — K219 Gastro-esophageal reflux disease without esophagitis: Secondary | ICD-10-CM | POA: Diagnosis not present

## 2020-07-22 DIAGNOSIS — M25552 Pain in left hip: Secondary | ICD-10-CM | POA: Insufficient documentation

## 2020-07-22 DIAGNOSIS — Z9221 Personal history of antineoplastic chemotherapy: Secondary | ICD-10-CM | POA: Diagnosis not present

## 2020-07-22 DIAGNOSIS — N189 Chronic kidney disease, unspecified: Secondary | ICD-10-CM | POA: Insufficient documentation

## 2020-07-22 DIAGNOSIS — D631 Anemia in chronic kidney disease: Secondary | ICD-10-CM | POA: Insufficient documentation

## 2020-07-22 DIAGNOSIS — Z5111 Encounter for antineoplastic chemotherapy: Secondary | ICD-10-CM | POA: Diagnosis not present

## 2020-07-22 DIAGNOSIS — R5383 Other fatigue: Secondary | ICD-10-CM | POA: Diagnosis not present

## 2020-07-22 DIAGNOSIS — E785 Hyperlipidemia, unspecified: Secondary | ICD-10-CM | POA: Insufficient documentation

## 2020-07-22 DIAGNOSIS — M25562 Pain in left knee: Secondary | ICD-10-CM | POA: Insufficient documentation

## 2020-07-22 DIAGNOSIS — Z9181 History of falling: Secondary | ICD-10-CM | POA: Diagnosis not present

## 2020-07-22 DIAGNOSIS — C8338 Diffuse large B-cell lymphoma, lymph nodes of multiple sites: Secondary | ICD-10-CM | POA: Diagnosis not present

## 2020-07-22 DIAGNOSIS — R51 Headache with orthostatic component, not elsewhere classified: Secondary | ICD-10-CM | POA: Insufficient documentation

## 2020-07-22 DIAGNOSIS — F418 Other specified anxiety disorders: Secondary | ICD-10-CM | POA: Insufficient documentation

## 2020-07-22 DIAGNOSIS — C8336 Diffuse large B-cell lymphoma, intrapelvic lymph nodes: Secondary | ICD-10-CM

## 2020-07-22 DIAGNOSIS — I129 Hypertensive chronic kidney disease with stage 1 through stage 4 chronic kidney disease, or unspecified chronic kidney disease: Secondary | ICD-10-CM | POA: Diagnosis not present

## 2020-07-22 DIAGNOSIS — C8513 Unspecified B-cell lymphoma, intra-abdominal lymph nodes: Secondary | ICD-10-CM

## 2020-07-22 DIAGNOSIS — Z79899 Other long term (current) drug therapy: Secondary | ICD-10-CM | POA: Insufficient documentation

## 2020-07-22 LAB — CBC WITH DIFFERENTIAL/PLATELET
Abs Immature Granulocytes: 0.06 10*3/uL (ref 0.00–0.07)
Basophils Absolute: 0 10*3/uL (ref 0.0–0.1)
Basophils Relative: 1 %
Eosinophils Absolute: 0.2 10*3/uL (ref 0.0–0.5)
Eosinophils Relative: 3 %
HCT: 30.3 % — ABNORMAL LOW (ref 39.0–52.0)
Hemoglobin: 9.1 g/dL — ABNORMAL LOW (ref 13.0–17.0)
Immature Granulocytes: 1 %
Lymphocytes Relative: 15 %
Lymphs Abs: 1 10*3/uL (ref 0.7–4.0)
MCH: 31.7 pg (ref 26.0–34.0)
MCHC: 30 g/dL (ref 30.0–36.0)
MCV: 105.6 fL — ABNORMAL HIGH (ref 80.0–100.0)
Monocytes Absolute: 0.6 10*3/uL (ref 0.1–1.0)
Monocytes Relative: 10 %
Neutro Abs: 4.6 10*3/uL (ref 1.7–7.7)
Neutrophils Relative %: 70 %
Platelets: 379 10*3/uL (ref 150–400)
RBC: 2.87 MIL/uL — ABNORMAL LOW (ref 4.22–5.81)
RDW: 14.4 % (ref 11.5–15.5)
WBC: 6.5 10*3/uL (ref 4.0–10.5)
nRBC: 0 % (ref 0.0–0.2)

## 2020-07-22 LAB — COMPREHENSIVE METABOLIC PANEL
ALT: 14 U/L (ref 0–44)
AST: 15 U/L (ref 15–41)
Albumin: 3.3 g/dL — ABNORMAL LOW (ref 3.5–5.0)
Alkaline Phosphatase: 97 U/L (ref 38–126)
Anion gap: 10 (ref 5–15)
BUN: 24 mg/dL — ABNORMAL HIGH (ref 8–23)
CO2: 23 mmol/L (ref 22–32)
Calcium: 9.8 mg/dL (ref 8.9–10.3)
Chloride: 100 mmol/L (ref 98–111)
Creatinine, Ser: 1.94 mg/dL — ABNORMAL HIGH (ref 0.61–1.24)
GFR calc Af Amer: 38 mL/min — ABNORMAL LOW (ref >60)
GFR calc non Af Amer: 33 mL/min — ABNORMAL LOW (ref >60)
Glucose, Bld: 154 mg/dL — ABNORMAL HIGH (ref 70–99)
Potassium: 4.3 mmol/L (ref 3.5–5.1)
Sodium: 133 mmol/L — ABNORMAL LOW (ref 135–145)
Total Bilirubin: 0.4 mg/dL (ref 0.3–1.2)
Total Protein: 7.5 g/dL (ref 6.5–8.1)

## 2020-07-22 LAB — LACTATE DEHYDROGENASE: LDH: 184 U/L (ref 98–192)

## 2020-07-22 LAB — MAGNESIUM: Magnesium: 1.9 mg/dL (ref 1.7–2.4)

## 2020-07-22 MED ORDER — LENALIDOMIDE 10 MG PO CAPS: 10.0000 mg | ORAL_CAPSULE | Freq: Every day | ORAL | 0 refills | Status: DC

## 2020-07-22 NOTE — Progress Notes (Signed)
DISCONTINUE ON PATHWAY REGIMEN - Lymphoma and CLL     A cycle is every 21 days:     Prednisone      Rituximab-xxxx      Cyclophosphamide      Doxorubicin      Vincristine   **Always confirm dose/schedule in your pharmacy ordering system**  REASON: Disease Progression PRIOR TREATMENT: [id number]: R(IV)-CHOP q21 Days x 6 Cycles TREATMENT RESPONSE: Progressive Disease (PD)  START OFF PATHWAY REGIMEN - Lymphoma and CLL   [id number]:Lenalidomide 25 mg PO + Tafasitamab 12 mg/kg IV (C1-12) Followed by Tafasitamab 12 mg/kg IV (C13+):   Cycle 1: A cycle is 28 days:     Lenalidomide      Tafasitamab-cxix    Cycles 2 and 3: A cycle is every 28 days:     Lenalidomide      Tafasitamab-cxix    Cycles 4 through 12: A cycle is every 28 days:     Lenalidomide      Tafasitamab-cxix    Cycles 13 and beyond: A cycle is every 28 days:     Tafasitamab-cxix   **Always confirm dose/schedule in your pharmacy ordering system**  Patient 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

## 2020-07-22 NOTE — Patient Instructions (Signed)
[place] at [facility] Discharge Instructions  You were seen today by Dr. Delton Coombes.  We are going to start you on Treatment next Tuesday. We have provided you a copy of a schedule for your treatments.  We will see you back next week.     Thank you for choosing [place] at [facility] to provide your oncology and hematology care.  To afford each patient quality time with our provider, please arrive at least 15 minutes before your scheduled appointment time.   If you have a lab appointment with the [facility] please come in thru the Main Entrance and check in at the main information desk.  You need to re-schedule your appointment should you arrive 10 or more minutes late.  We strive to give you quality time with our providers, and arriving late affects you and other patients whose appointments are after yours.  Also, if you no show three or more times for appointments you may be dismissed from the clinic at the providers discretion.     Again, thank you for choosing [facility].  Our hope is that these requests will decrease the amount of time that you wait before being seen by our physicians.       _____________________________________________________________  Should you have questions after your visit to [facility], please contact our office at [phone] and follow the prompts.  Our office hours are 8:00 a.m. and 4:30 p.m. Monday - Friday.  Please note that voicemails left after 4:00 p.m. may not be returned until the following business day.  We are closed weekends and major holidays.  You do have access to a nurse 24-7, just call the main number to the clinic [phone] and do not press any options, hold on the line and a nurse will answer the phone.    For prescription refill requests, have your pharmacy contact our office and allow 72 hours.    Due to Covid, you will need to wear a mask upon entering the  hospital. If you do not have a mask, a mask will be given to you at the Main Entrance upon arrival. For doctor visits, patients may have 1 support person age 36 or older with them. For treatment visits, patients can not have anyone with them due to social distancing guidelines and our immunocompromised population.

## 2020-07-22 NOTE — Progress Notes (Signed)
FL2 faxed to [facility].

## 2020-07-22 NOTE — Progress Notes (Signed)
Daniel Richards, Daniel Richards [id number]   CLINIC:  Medical Oncology/Hematology  PCP:  [name], FNP [facility] [address] [place] [address]  [phone]  REASON FOR VISIT:  Follow-up for stage IV DLBCL  PRIOR THERAPY:  1. R mini CHOP x 6 cycles from 08/21/2019 to 01/21/2020. 2. Radiation therapy x 10 treatments from 05/27/2020 to 06/09/2020.  CURRENT THERAPY: Observation  INTERVAL HISTORY:  [name], a 74 y.o. male, returns for routine follow-up for his stage IV DLBCL. Daniel Richards was last seen on 06/29/2020.  Today he is accompanied by his daughter. He reports not feeling so good and falling frequently. He is unable to walk anymore since he was falling twice a week. He is unable to live on his own and had palliative come and inspect his home. He continues complaining of his left hip and leg pain, which gives way when he stands. He denies neck pain or sore throat, though he continues coughing up yellow sputum. He has mild headaches but denies vision changes.  He received his second [place] vaccine in August 2021.   REVIEW OF SYSTEMS:  Review of Systems  Constitutional: Positive for appetite change (moderately decreased) and fatigue (depleted).  HENT:   Negative for sore throat.   Eyes: Negative for eye problems.  Respiratory: Positive for cough (yellow sputum).   Gastrointestinal: Positive for diarrhea.  Genitourinary: Positive for difficulty urinating (Foley).   Musculoskeletal: Positive for arthralgias (2/10 L hip and leg pain). Negative for neck pain.  Neurological: Positive for dizziness, headaches (mild) and numbness (hands).  All other systems reviewed and are negative.   PAST MEDICAL/SURGICAL HISTORY:  Past Medical History:  Diagnosis Date  . Acid reflux   . Anxiety and depression   . Cancer ([place])    lymphoma  . Depression   . Diabetes ([place])   . Diabetes mellitus 06/09/2011   pt taking  metformin  . DJD of shoulder    right   . Hyperlipidemia   . Hypertension   . Lymphoma ([place])   . Migraines   . Port-A-Cath in place 08/13/2019  . Pulmonary embolism ([place])   . Stomach ulcer   . Tremor   . Tremor   . Vitamin D deficiency    Past Surgical History:  Procedure Laterality Date  . BACK SURGERY    . KNEE SURGERY    . PORTACATH PLACEMENT Left 08/08/2019   Procedure: INSERTION PORT-A-CATH;  Surgeon: Aviva Signs, MD;  Location: [facility];  Service: General;  Laterality: Left;  . SHOULDER SURGERY      SOCIAL HISTORY:  Social History   Socioeconomic History  . Marital status: Divorced    Spouse name: Not on file  . Number of children: 2  . Years of education: 12+  . Highest education level: Not on file  Occupational History  . Not on file  Tobacco Use  . Smoking status: Former Smoker    Packs/day: 2.00    Years: 20.00    Pack years: 40.00    Types: Cigarettes    Quit date: 11/17/1989    Years since quitting: 30.6  . Smokeless tobacco: Never Used  Vaping Use  . Vaping Use: Never used  Substance and Sexual Activity  . Alcohol use: No  . Drug use: No  . Sexual activity: Not Currently  Other Topics Concern  . Not on file  Social History Narrative   Lives at  home with son.   Caffeine use: Drinks 1 cup coffee (3 cups per week-decaf)   Social Determinants of Health   Financial Resource Strain: Medium Risk  . Difficulty of Paying Living Expenses: Somewhat hard  Food Insecurity: No Food Insecurity  . Worried About Charity fundraiser in the Last Year: Never true  . Ran Out of Food in the Last Year: Never true  Transportation Needs: No Transportation Needs  . Lack of Transportation (Medical): No  . Lack of Transportation (Non-Medical): No  Physical Activity: Inactive  . Days of Exercise per Week: 0 days  . Minutes of Exercise per Session: 0 min  Stress: No Stress Concern Present  . Feeling of Stress : Not at all  Social Connections: Socially Isolated  .  Frequency of Communication with Friends and Family: Never  . Frequency of Social Gatherings with Friends and Family: Twice a week  . Attends Religious Services: More than 4 times per year  . Active Member of Clubs or Organizations: No  . Attends Archivist Meetings: Never  . Marital Status: Divorced  Human resources officer Violence: Not At Risk  . Fear of Current or Ex-Partner: No  . Emotionally Abused: No  . Physically Abused: No  . Sexually Abused: No    FAMILY HISTORY:  Family History  Problem Relation Age of Onset  . Cancer Sister   . Cancer Sister   . Cancer Brother   . Heart attack Father   . Cancer Brother   . Heart attack Brother   . Healthy Son   . Healthy Daughter   . Migraines Neg Hx     CURRENT MEDICATIONS:  Current Outpatient Medications  Medication Sig Dispense Refill  . allopurinol (ZYLOPRIM) 300 MG tablet Take 1 tablet (300 mg total) by mouth daily. 30 tablet 0  . citalopram (CELEXA) 20 MG tablet Take 1 tablet (20 mg total) by mouth at bedtime. (Patient taking differently: Take 40 mg by mouth at bedtime. ) 30 tablet 0  . cyclobenzaprine (FLEXERIL) 10 MG tablet Take 10 mg by mouth at bedtime.    . CYCLOPHOSPHAMIDE IV Inject into the vein every 21 ( twenty-one) days.  (Patient not taking: Reported on 07/22/2020)    . DOXORUBICIN HCL IV Inject into the vein every 21 ( twenty-one) days.  (Patient not taking: Reported on 07/22/2020)    . ELIQUIS 5 MG TABS tablet Take 5 mg by mouth 2 (two) times daily.    Marland Kitchen gabapentin (NEURONTIN) 300 MG capsule Take 2 capsules (600 mg total) by mouth 3 (three) times daily. 90 capsule 0  . HYDROcodone-acetaminophen (NORCO) 10-325 MG tablet Take 1 tablet by mouth every 6 (six) hours as needed for moderate pain or severe pain.     . metFORMIN (GLUCOPHAGE) 500 MG tablet Take 1 tablet (500 mg total) by mouth daily. 30 tablet 0  . pantoprazole (PROTONIX) 40 MG tablet Take 1 tablet (40 mg total) by mouth 2 (two) times daily before a meal.  60 tablet 0  . primidone (MYSOLINE) 50 MG tablet Take 1.5 tablets (75 mg total) by mouth every 8 (eight) hours. (Patient taking differently: Take 100 mg by mouth 2 (two) times daily. ) 150 tablet 0  . propranolol (INDERAL) 40 MG tablet Take 1 tablet (40 mg total) by mouth 2 (two) times daily. 60 tablet 0  . riTUXimab in sodium chloride 0.9 % 250 mL Inject into the vein every 21 ( twenty-one) days.  (Patient not taking: Reported on 07/22/2020)    .  temazepam (RESTORIL) 15 MG capsule Take 1 capsule (15 mg total) by mouth at bedtime. 15 capsule 0  . topiramate (TOPAMAX) 100 MG tablet Take 2 tablets (200 mg total) by mouth at bedtime. 60 tablet 0  . vinCRIStine 2 mg in sodium chloride 0.9 % 50 mL Inject 2 mg into the vein every 21 ( twenty-one) days.  (Patient not taking: Reported on 07/22/2020)     No current facility-administered medications for this visit.    ALLERGIES:  Allergies  Allergen Reactions  . Vancomycin Hives    Patient received Benadryl to treat the hives    PHYSICAL EXAM:  Performance status (ECOG): 2 - Symptomatic, <50% confined to bed  Vitals:   07/22/20 1032  BP: (!) 97/50  Pulse: 82  Resp: 18  Temp: (!) 97.1 F (36.2 C)  SpO2: 99%   Wt Readings from Last 3 Encounters:  07/22/20 175 lb 11.2 oz (79.7 kg)  07/13/20 180 lb (81.6 kg)  06/29/20 181 lb 12.8 oz (82.5 kg)   Physical Exam Vitals reviewed.  Constitutional:      Appearance: Normal appearance.  Cardiovascular:     Rate and Rhythm: Normal rate and regular rhythm.     Pulses: Normal pulses.     Heart sounds: Normal heart sounds.  Pulmonary:     Effort: Pulmonary effort is normal.     Breath sounds: Normal breath sounds.  Chest:     Comments: Port-a-Cath in L chest Neurological:     General: No focal deficit present.     Mental Status: He is alert and oriented to person, place, and time.     Motor: Weakness (L hip and knee) and tremor present.  Psychiatric:        Mood and Affect: Mood normal.         Behavior: Behavior normal.     LABORATORY DATA:  I have reviewed the labs as listed.  CBC Latest Ref Rng & Units 07/22/2020 06/29/2020 04/26/2020  WBC 4.0 - 10.5 K/uL 6.5 4.1 5.2  Hemoglobin 13.0 - 17.0 g/dL 9.1(L) 9.8(L) 11.0(L)  Hematocrit 39 - 52 % 30.3(L) 32.6(L) 35.0(L)  Platelets 150 - 400 K/uL 379 253 309   CMP Latest Ref Rng & Units 07/22/2020 06/29/2020 04/26/2020  Glucose 70 - 99 mg/dL 154(H) 112(H) 165(H)  BUN 8 - 23 mg/dL 24(H) 31(H) 39(H)  Creatinine 0.61 - 1.24 mg/dL 1.94(H) 2.16(H) 1.94(H)  Sodium 135 - 145 mmol/L 133(L) 140 139  Potassium 3.5 - 5.1 mmol/L 4.3 4.5 4.8  Chloride 98 - 111 mmol/L 100 105 103  CO2 22 - 32 mmol/L 23 27 27   Calcium 8.9 - 10.3 mg/dL 9.8 9.2 10.1  Total Protein 6.5 - 8.1 g/dL 7.5 7.5 7.4  Total Bilirubin 0.3 - 1.2 mg/dL 0.4 0.4 0.3  Alkaline Phos 38 - 126 U/L 97 107 98  AST 15 - 41 U/L 15 16 27   ALT 0 - 44 U/L 14 17 30       Component Value Date/Time   RBC 2.87 (L) 07/22/2020 0951   MCV 105.6 (H) 07/22/2020 0951   MCH 31.7 07/22/2020 0951   MCHC 30.0 07/22/2020 0951   RDW 14.4 07/22/2020 0951   LYMPHSABS 1.0 07/22/2020 0951   MONOABS 0.6 07/22/2020 0951   EOSABS 0.2 07/22/2020 0951   BASOSABS 0.0 07/22/2020 0951   Lab Results  Component Value Date   LDH 184 07/22/2020   LDH 128 06/29/2020   LDH 120 04/26/2020    DIAGNOSTIC IMAGING:  I  have independently reviewed the scans and discussed with the patient. MR PELVIS W WO CONTRAST  Addendum Date: 07/19/2020   ADDENDUM REPORT: 07/19/2020 16:24 ADDENDUM: Dictation error in the musculoskeletal findings section above. Multiple lesions in the bilateral bony pelvis and proximal FEMURS, measuring up to 9 mm on the left, suggesting metastases/lymphomatous involvement. Remainder of the report, including the impression, is unchanged. Electronically Signed   By: Julian Hy M.D.   On: 07/19/2020 16:24   Result Date: 07/19/2020 CLINICAL DATA:  Diffuse large B-cell lymphoma, pelvic  lymphadenopathy EXAM: MRI PELVIS WITHOUT AND WITH CONTRAST TECHNIQUE: Multiplanar multisequence MR imaging of the pelvis was performed both before and after administration of intravenous contrast. CONTRAST:  72mL GADAVIST GADOBUTROL 1 MMOL/ML IV SOLN COMPARISON:  MRI pelvis dated 05/31/2020 FINDINGS: Urinary Tract: Irregular bladder wall thickening. Indwelling Foley catheter. Bowel: Wall thickening involving the left posterolateral rectum (series 15/image 29), new from the prior, likely reflecting tumor involvement. Vascular/Lymphatic: No evidence of aneurysm. Multifocal tumor along the left posterior pelvic sidewall. In aggregate, this measures 6.0 x 2.3 cm (series 11/image 22), previously 6.2 x 4.4 cm, improved. However, this likely reflects several smaller discrete nodes, including a 16 mm short axis node superiorly (series 12/image 19) and inferiorly (series 12/image 21). Tumor continues to involve the exiting S1-3 nerve roots on the left, particularly S2. New 15 mm short axis left external iliac node (series 12/image 20). New 11 mm short axis left deep inguinal node (series 12/image 23). Retroperitoneal lymphadenopathy, incompletely visualized, including a 2.0 cm short axis right common iliac node and a 1.9 cm short axis left common iliac node (series 12/image 8). Reproductive:  Prostatomegaly, suggesting BPH. Other:  No pelvic ascites. Musculoskeletal: Tumor involvement of the left iliopsoas musculature (series 15/images 1 and 5). Multiple lesions in the bilateral bony pelvis and proximal humerii, measuring up to 9 mm on the left (series 10/image 10), suggesting metastases/lymphomatous involvement. IMPRESSION: Retroperitoneal, left posterior pelvic, and left inguinal lymphadenopathy, as described above, progressive. Tumor likely involves the left iliopsoas musculature. Tumor continues to involve the exiting S1-3 nerve roots on the left, particularly S2. Tumor likely involves the left posterolateral rectum.  Irregular bladder wall thickening, poorly evaluated. Indwelling Foley catheter. Suspected multifocal osseous metastases/lymphomatous involvement. Correlate with pending PET-CT. Electronically Signed: By: Julian Hy M.D. On: 07/19/2020 10:37   NM PET Image Initial (PI) Skull Base To Thigh  Result Date: 07/19/2020 CLINICAL DATA:  Subsequent treatment strategy for diffuse large B-cell lymphoma. EXAM: NUCLEAR MEDICINE PET SKULL BASE TO THIGH TECHNIQUE: 8.8 mCi F-18 FDG was injected intravenously. Full-ring PET imaging was performed from the skull base to thigh after the radiotracer. CT data was obtained and used for attenuation correction and anatomic localization. Fasting blood glucose: 113 mg/dl COMPARISON:  MR pelvis dated 07/20/2019.  PET-CT dated 04/26/2020. FINDINGS: Mediastinal blood pool activity: SUV max 3.0 Liver activity: SUV max 4.6 NECK: 2.7 cm right pharyngeal lesion (series 4/image 20), max SUV 11.2, new. Numerous right cervical nodes, including a 10 mm short axis right level 2 node (series 4/image 29), max SUV 5.6, new. Incidental CT findings: none CHEST: No hypermetabolic thoracic lymphadenopathy. No suspicious pulmonary nodules. Left chest port terminates in the lower SVC. Incidental CT findings: Biapical pleural-parenchymal scarring. Mild centrilobular and paraseptal emphysematous changes, upper lung predominant. Atherosclerotic calcifications of the aortic arch. Three vessel coronary atherosclerosis. ABDOMEN/PELVIS: 1.9 cm metastasis inferiorly in the right hepatic lobe (series 4/image 124), max SUV 11.0, new. Reference splenic hypermetabolism max SUV 3.6, without focal lesion.  No abnormal hypermetabolism in the pancreas or adrenal glands. Retroperitoneal/pelvic lymphadenopathy, including: --15 mm short axis node at the aortic bifurcation (series 4/image 152), max SUV 21.5, new --19 mm short axis right common iliac node (series 4/image 156), max SUV 17.0, new --14 mm short axis left  external iliac node (series 4/image 172), max SUV 15.6, new --6.1 x 2.3 cm nodal mass along the left posterior pelvic sidewall (series 4/image 175), similar to prior pelvic MR (July 2021) but progressive from prior PET, max SUV 17.5, previously 10.7 Abnormal soft tissue along the left posterior aspect of the rectum (series 4/image 26), max SUV 15.6, new. Irregular masslike wall thickening involving the bladder (series 4/image 180), raising concern for underlying neoplasm. Bladder is decompressed by indwelling Foley catheter with associated nondependent gas. This is poorly evaluated on PET due to excretory radiotracer. Incidental CT findings: Left renal cysts. Layering bladder calculi. Mild pelvic mesenteric stranding. SKELETON: Tumor involvement of the left iliopsoas musculature, max SUV 19.5, new. Additional involvement of the medial right psoas muscle, max SUV 18.6, new. Multifocal osseous metastases involving the visualized axial and appendicular skeleton, including the bilateral proximal humerii, right acromion and scapula, multiple right ribs, multiple thoracolumbar vertebral bodies, bilateral pelvis, and bilateral proximal femurs. Representative lesions include: --Left T11 vertebral body, max SUV 10.2 --Right L3 vertebral body, max SUV 9.8 --Left iliac crest, max SUV 21.5 Incidental CT findings: Degenerative changes of the visualized thoracolumbar spine. IMPRESSION: Progressive retroperitoneal/pelvic lymphadenopathy, as described above. Associated involvement of the bilateral iliopsoas musculature as well as the left posterolateral rectum, new. New hepatic metastasis. Irregular masslike wall thickening involving the bladder, raising concern for underlying neoplasm. Consider cystoscopy if clinically warranted. Right pharyngeal lesion, new. This presumably reflects lymphoma but is amenable to visual inspection and sampling if there is concern for primary head/neck cancer. Associated cervical nodal metastases.  Multifocal osseous metastases/lymphomatous involvement in the visualized axial and appendicular skeleton. Electronically Signed   By: Julian Hy M.D.   On: 07/19/2020 16:22   DG Knee Complete 4 Views Left  Result Date: 07/14/2020 CLINICAL DATA:  Left knee pain.  Recurrent falls. EXAM: LEFT KNEE - COMPLETE 4+ VIEW COMPARISON:  None. FINDINGS: Mild tricompartmental peripheral spurring. The joint spaces are preserved. Trace joint effusion. There is a 3 cm well-defined osseous projection from the lateral femoral metaphysis directed away from the joint with cortical and medullary continuity consistent with osteochondroma. No fracture, erosion or bony destruction. IMPRESSION: 1. Mild tricompartmental osteoarthritis with trace joint effusion. No acute fracture. 2. Elongated 3 cm osteochondroma from the lateral femur. In the absence of focal pain in this region, no dedicated further imaging is needed. If there is focal pain, consider further evaluation with MRI. Electronically Signed   By: Keith Rake M.D.   On: 07/14/2020 22:27   DG HIP UNILAT WITH PELVIS 2-3 VIEWS LEFT  Result Date: 07/14/2020 CLINICAL DATA:  Left hip pain.  Recurrent falls. EXAM: DG HIP (WITH OR WITHOUT PELVIS) 2-3V LEFT COMPARISON:  None. FINDINGS: Mild osteoarthritis of the left hip with minimal joint space narrowing and femoral head spurring. Mild right hip osteoarthritis also seen. Pubic rami are intact. The sacroiliac joints are congruent. There is no evidence of fracture, avascular necrosis, focal bone lesion or bony destruction. Soft tissues are unremarkable. IMPRESSION: Mild osteoarthritis of the left hip. Electronically Signed   By: Keith Rake M.D.   On: 07/14/2020 22:24     ASSESSMENT:  1. Stage IV DLBCL: -6 cycles of R mini CHOP  from 08/21/2019 through 01/21/2020. -PET scan on 04/26/2020 showed slight interval enlargement of hypermetabolic nodule in the deep left pelvis measuring 27 mm, SUV 10.4 increased in size  and metabolic activity. 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 -MRI pelvis with 6.2 x 4.4 x 3.9 cm soft tissue mass along the posterior left pelvic sidewall and presacral space has increased slightly in size from 05/12/2020. Tumor is extending into S2 foramen causing attenuation and abnormal signal in the S2 root consistent with tumor involvement. Tumor may minimally involve S1 root and S3 to concerning for involvement. -XRT to the left pelvic nodule completed on 06/09/2020, 10 treatments. -PET scan on 07/19/2020 showed progressive retroperitoneal/pelvic lymphadenopathy with involvement of bilateral iliopsoas musculature, new solitary hepatic metastasis.  Multifocal bone lesions present. -MRI of the pelvis on 07/19/2020 also showed multiple lesions in the bilateral bony pelvis and proximal femurs, adenopathy.  2.  Pulmonary embolism: -Diagnosed with bilateral pulmonary embolism on 08/26/2019.  He is on Eliquis.   PLAN:  1. Stage IV DLBCL: -We reviewed MRI of the pelvis and PET scan results which showed recurrence of lymphoma. -We discussed various options including best supportive care in the form of hospice versus palliative chemotherapy. -Patient is very weak and unable to care for himself.  He wants to take active treatments for lymphoma after he is placed at a nursing home. -We discussed about treatment with tafasitamab and Revlimid.  I have quoted overall response rate of 60% with complete response rate of 43%.  We discussed side effects in detail. -I will start him on Revlimid low-dose 10 mg 3 weeks on 1 week off based on his renal function. -We will start his treatment as soon as possible.  We will use raspburicase. -We will start Revlimid once we get the pill.  2.  Pulmonary embolism: -He will continue Eliquis at this time.  No bleeding issues.  3.  Back pain: -He has pain in the lower back, left hip and knee.  4.   Urinary problems: -He has chronic indwelling Foley catheter and follows up with urology.  5.  Macrocytic anemia: -Combination anemia from CKD. -Hemoglobin today is 9.1 with MCV of 105.  LDH is 184.  Orders placed this encounter:  No orders of the defined types were placed in this encounter.    Derek Jack, MD [place] [phone]   I, Milinda Antis, am acting as a scribe for Dr. Sanda Linger.  I, Derek Jack MD, have reviewed the above documentation for accuracy and completeness, and I agree with the above.

## 2020-07-22 NOTE — Patient Instructions (Signed)
Tafasitamab-cxix ([place])  About This Drug Tafasitamab-cxix is used to treat cancer. It is given in the vein (IV).  Possible Side Effects . Bone marrow suppression. This is a decrease in the number of white blood cells, red blood cells, and platelets. This may raise your risk of infection, make you tired and weak, and raise your risk of bleeding.  . Diarrhea (loose bowel movements)  . Swelling of your legs, ankles and/or feet  . Tiredness  . Fever  . Decreased appetite (decreased hunger)  . Cough  . Respiratory tract infection  Note: Each of the side effects above was reported in 20% or greater of patients treated with tafasitamab-cxix. Not all possible side effects are included above.  Warnings and Precautions . Severe bone marrow suppression  . While you are getting this drug in your vein (IV), you may have a reaction to the drug. Sometimes you may be given medication to stop or lessen these side effects. Your nurse will check you closely for these signs: fever or shaking chills, flushing, facial swelling, feeling dizzy, headache, trouble breathing, rash, itching, chest tightness, or chest pain. These reactions may happen after your infusion. If this happens, call 911 for emergency care.  . Risk of severe and life-threatening infection  Note: Some of the side effects above are very rare. If you have concerns and/or questions, please discuss them with your medical team.  Important Information . Tafasitamab is usually taken with lenalidomide. Please refer to lenalidomide medication guide for important information about pregnancy, contraception, and blood and sperm donation.  . This drug may be present in the saliva, tears, sweat, urine, stool, vomit, semen, and vaginal secretions. Talk to your doctor and/or your nurse about the necessary precautions to take during this time.  Treating Side Effects . Drink plenty of fluids (a minimum of eight glasses per day is  recommended).  . If you have diarrhea, you should drink more fluids so that you do not become dehydrated (lack of water in the body from losing too much fluid). Eat low-fiber foods that are high in protein and calories and avoid foods that can irritate your digestive tracts or lead to cramping.  . Ask your nurse or doctor about medicine that can lessen or stop your diarrhea.  . To help with decreased appetite, eat small, frequent meals. Eat foods high in calories and protein, such as meat, poultry, fish, dry beans, tofu, eggs, nuts, milk, yogurt, cheese, ice cream, pudding, and nutritional supplements.  . Consider using sauces and spices to increase taste. Daily exercise, with your doctor's approval, may increase your appetite.  . Manage tiredness by pacing your activities for the day.  . Be sure to include periods of rest between energy-draining activities.  . To decrease the risk of infection, wash your hands regularly.  . Avoid close contact with people who have a cold, the flu, or other infections.  . Take your temperature as your doctor or nurse tells you, and whenever you feel like you may have a fever.  . To help decrease the risk of bleeding, use a soft toothbrush. Check with your nurse before using dental floss.  . Be very careful when using knives or tools.  . Use an electric shaver instead of a razor.  . Infusion reactions may happen after your infusion. If this happens, call 911 for emergency care.  Food and Drug Interactions . There are no known interactions of tafasitamab-cxix with food.  . This drug may interact with other  medicines. Tell your doctor and pharmacist about all the prescription and over-the-counter medicines and dietary supplements (vitamins, minerals, herbs, and others) that you are taking at this time. Also, check with your doctor or pharmacist before starting any new prescription or over-the-counter medicines, or dietary supplements to make sure that  there are no interactions.  When to Call the Doctor Call your doctor or nurse if you have any of these symptoms and/or any new or unusual symptoms: . Fever of 100.4 F (38 C) or higher  . Chills  . Tiredness that interferes with your daily activities  . Feeling dizzy or lightheaded  . Easy bleeding or bruising  . Wheezing and/or trouble breathing  . Coughing up yellow, green, or bloody mucus  . Cough that is bothersome  . Diarrhea, 4 times in one day or diarrhea with lack of strength or a feeling of being dizzy  . Weight gain of 5 pounds in one week (fluid retention)  . Swelling in your legs, ankles and/or feet  . Lasting loss of appetite or rapid weight loss of five pounds in a week  . Signs of infusion reaction: fever or shaking chills, flushing, facial swelling, feeling dizzy, headache, trouble breathing, rash, itching, chest tightness, or chest pain. These reactions may happen after your infusion. If this happens, call 911 for emergency care.  . If you think you may be pregnant or may have impregnated your partner  Reproduction Warnings . Pregnancy warning: This drug may have harmful effects on the unborn baby. Women of childbearing potential should use effective methods of birth control during your cancer treatment and for at least 3 months after treatment. Women and men with male partners of childbearing potential should also refer to lenalidomide medication guide for more specific information. Let your doctor know right away if you think you may be pregnant.  . Breastfeeding warning: It is not known if this drug passes into breast milk. For this reason, women should not breastfeed during treatment and for at least 3 months after treatment because this drug could enter the breast milk and cause harm to a breastfeeding baby. Please also refer to lenalidomide medication guide for more specific information.  . Fertility warning: Fertility studies have not been done with  this drug. Talk with your doctor or nurse if you plan to have children. Ask for information on sperm or egg banking. Please also refer to lenalidomide medication guide for more specific information.   Lenalidomide (Revlimid)  About This Drug Lenalidomide is used to treat cancer. It is given orally (by mouth).  Possible Side Effects . Bone marrow suppression. This is a decrease in the number of white blood cells, red blood cells, and platelets. This may raise your risk of infection, make you tired and weak (fatigue), and raise your risk of bleeding.  . Nausea  . Diarrhea (loose bowel movements)  . Constipation (unable to move bowels)  . Inflammation of your stomach and/or intestines  . Pain in your abdomen or back pain  . Fever  . Tiredness and weakness  . Swelling of your legs, ankles and/or feet  . Decreased appetite (decreased hunger)  . Muscle cramps/spasms  . Pain in your joints  . Headache  . Feeling dizzy  . Tremor  . Trouble sleeping  . Nosebleed  . Upper respiratory infection, bronchitis  . Inflammation of the nasal passages and throat  . Trouble breathing  . Cough  . Rash and itching  Note: Each of the side effects  above was reported in 15% or greater of patients treated with lenalidomide. Not all possible side effects are included above.  Warnings and Precautions . Blood clots and events such as stroke and heart attack. A blood clot in your leg may cause your leg to swell, appear red and warm, and/or cause pain. A blood clot in your lungs may cause trouble breathing, pain when breathing, and/or chest pain.  . Severe bone marrow suppression  . Changes in your liver function, which may cause liver failure and be life-threatening.  . Tumor lysis syndrome: This drug may act on the cancer cells very quickly. This may affect how your kidneys work and can be life-threatening.  . Changes in your thyroid function  . Severe allergic skin reaction  which may be life-threatening. You may develop blisters on your skin that are filled with fluid or a severe red rash all over your body that may be painful.  . This drug may raise your risk of getting a second cancer.  . You may develop a syndrome called tumor flare reaction. You may have painful lymph nodes, enlarged spleen, fever, and a rash.  . This drug may make it more difficult to collect your stem cells if a stem cell transplant is part of your treatment plan.  . There is a rare increased risk of death in patients with chronic lymphocytic leukemia and a risk of early death (dying sooner) in patient with mantle cell lymphoma.  . Allergic reactions, including anaphylaxis are rare but may happen in some patients. Signs of allergic reaction to this drug may be swelling of the face, feeling like your tongue or throat are swelling, trouble breathing, rash, itching, fever, chills, feeling dizzy, and/or feeling that your heart is beating in a fast or not normal way. If this happens, do not take another dose of this drug. You should get urgent medical treatment.  Note: Some of the side effects above are very rare. If you have concerns and/or questions, please discuss them with your medical team.  Important Information . You will need to sign up for a special program called Revlimid REMS when you start taking this drug. Your nurse will help you get started.  . Two negative pregnancy tests are required in women of childbearing potential prior to starting treatment. Routine pregnancy tests are required during treatment.  . Do not donate blood during your treatment and for 4 weeks after your treatment.  . Men should not donate sperm during your treatment and for 4 weeks after your treatment because this drug is present in semen and may badly harm a baby.  How to Take Your Medication . Swallow the medicine whole with water, with or without food. Do not chew, break, or open it.  . Take this  medicine at about the same time each day  . Missed dose: If you miss a dose, take it as soon as you think about it ONLY if it has been less than 12 hours since you normally take the missed dose. If it has been more than 12 hours, skip the missed dose and contact your physician. Take your next dose at the regular time. Do not take 2 doses at the same time and do not double up on the next dose.  . If you vomit a dose, take your next dose at the regular time.  . Handling: Wash your hands after handling your medicine, your caretakers should not handle your medicine with bare hands and should wear  latex gloves.  . If you get any of the content of a broken capsules on your skin, you should wash the area of the skin well with soap and water right away. Call your doctor if you get a skin reaction.  . This drug may be present in the saliva, tears, sweat, urine, stool, vomit, semen, and vaginal secretions. Talk to your doctor and/or your nurse about the necessary precautions to take during this time.  . Storage: Store this medicine in the original container at room temperature.  . Disposal of unused medicine: Do not flush any expired and/or unused medicine down the toilet or drain unless you are specifically instructed to do so on the medication label. Some facilities have take-back programs and/or other options. If you do not have a take-back program in your area, then please discuss with your nurse or your doctor how to dispose of unused medicine.  Treating Side Effects . Manage tiredness by pacing your activities for the day.  . Be sure to include periods of rest between energy-draining activities.  . If you are dizzy, get up slowly after sitting or lying.  . To decrease the risk of infection, wash your hands regularly.  . Avoid close contact with people who have a cold, the flu, or other infections.  . Take your temperature as your doctor or nurse tells you, and whenever you feel like you may  have a fever.  . To help decrease the risk of bleeding, use a soft toothbrush. Check with your nurse before using dental floss.  . Be very careful when using knives or tools.  . Use an electric shaver instead of a razor.  . Ask your doctor or nurse about medicines that are available to help stop or lessen constipation and/or diarrhea.  . If you are not able to move your bowels, check with your doctor or nurse before you use enemas, laxatives, or suppositories.  . Drink plenty of fluids (a minimum of eight glasses per day is recommended).  . Drink fluids that contribute calories (whole milk, juice, soft drinks, sweetened beverages, milkshakes, and nutritional supplements) instead of water.  . If you throw up or have loose bowel movements, you should drink more fluids so that you do not become dehydrated (lack of water in the body from losing too much fluid).  . If you have diarrhea, eat low-fiber foods that are high in protein and calories and avoid foods that can irritate your digestive tracts or lead to cramping.  . To help with nausea and vomiting, eat small, frequent meals instead of three large meals a day. Choose foods and drinks that are at room temperature. Ask your nurse or doctor about other helpful tips and medicine that is available to help stop or lessen these symptoms.  . To help with decreased appetite, eat small, frequent meals. Eat foods high in calories and protein, such as meat, poultry, fish, dry beans, tofu, eggs, nuts, milk, yogurt, cheese, ice cream, pudding, and nutritional supplements.  . Consider using sauces and spices to increase taste. Daily exercise, with your doctor's approval, may increase your appetite.  . If you get a rash, do not put anything on it unless your doctor or nurse says you may. Keep the area around the rash clean and dry. Ask your doctor for medicine if your rash bothers you.  Marland Kitchen Keeping your pain under control is important to your well-being.  Please tell your doctor or nurse if you are experiencing pain.  Marland Kitchen  If you are having trouble sleeping, talk to your nurse or doctor on tips to help you sleep better.  . If you have a nosebleed, sit with your head tipped slightly forward. Apply pressure by lightly pinching the bridge of your nose between your thumb and forefinger. Call your doctor if you feel dizzy or faint or if the bleeding does not stop after 10 to 15 minutes.  . Moisturize your skin several times a day.  . Avoid sun exposure and apply sunscreen routinely when outdoors.  Food and Drug Interactions  . There are no known interactions of lenalidomide with food.  . Check with your doctor or pharmacist about all other prescription medicines and over-the-counter medicines and dietary supplements (vitamins, minerals, herbs, and others) you are taking before starting this medicine as there are known drug interactions with lenalidomide. Also, check with your doctor or pharmacist before starting any new prescription or over-the-counter medicines, or dietary supplements to make sure that there are no interactions.  . There are known interactions of lenalidomide with blood-thinning medicine such as warfarin. Ask your doctor what precautions you should take.  When to Call the Doctor Call your doctor or nurse if you have any of these symptoms and/or any new or unusual symptoms:  . Fever of 100.4 F (38 C) or higher  . Chills  . Tiredness that interferes with your daily activities  . Feeling dizzy or lightheaded  . Easy bleeding or bruising  . Your leg or arm is swollen, red, warm, and/or painful  . Headache that does not go away  . Nosebleed that does not stop bleeding after 10-15 minutes  . Painful lymph nodes  . Wheezing and/or trouble breathing  . Chest pain or symptoms of a heart attack. Most heart attacks involve pain in the center of the chest that lasts more than a few minutes. The pain may go away and come back.  It can feel like pressure, squeezing, fullness, or pain. Sometimes pain is felt in one or both arms, the back, neck, jaw, or stomach. If any of these symptoms last 2 minutes, call 911.  Marland Kitchen Symptoms of a stroke such as sudden numbness or weakness of your face, arm, or leg, mostly on one side of your body; sudden confusion, trouble speaking or understanding; sudden trouble seeing in one or both eyes; sudden trouble walking, feeling dizzy, loss of balance or coordination; or sudden, bad headache with no known cause. If you have any of these symptoms for 2 minutes, call 911.  . Signs of allergic reaction: swelling of the face, feeling like your tongue or throat are swelling, trouble breathing, rash, itching, fever, chills, feeling dizzy, and/or feeling that your heart is beating in a fast or not normal way. If this happens, call 911 for emergency care.  . Coughing up yellow, green, or bloody mucus  . Feeling that your heart is beating in a fast or not normal way (palpitations)  . Nausea that stops you from eating or drinking and/or is not relieved by prescribed medicines  . Throwing up  . Loose bowel movements (diarrhea) 4 times a day or loose bowel movements with lack of strength or a feeling of being dizzy  . No bowel movement in 3 days or when you feel uncomfortable  . Trouble falling or staying asleep  . Pain in your abdomen that does not go away  . Weight gain of 5 pounds in one week (fluid retention)  . Swelling of your legs, ankles  and/or feet  . Unexplained weight gain  . Lasting loss of appetite or rapid weight loss of five pounds in a week  . Pain that does not go away, or is not relieved by prescribed medicines  . Flu-like symptoms: fever, headache, muscle and joint aches, and fatigue (low energy, feeling weak)  . A new rash or itching that is not relieved by prescribed medicines  . Signs of possible liver problems: dark urine, pale bowel movements, bad stomach pain,  feeling very tired and weak, unusual itching, or yellowing of the eyes or skin  . Signs of tumor lysis: confusion or agitation, decreased urine, nausea/vomiting, diarrhea, muscle cramping, numbness and/or tingling, seizures  . If you think you may be pregnant or may have impregnated your partner  Reproduction Warnings  . Pregnancy warning: This drug can have harmful effects on the unborn baby. Women of childbearing potential must commit to abstain from heterosexual intercourse or use 2 effective methods of birth control, one of which, must be a highly effective method of birth control, beginning at least 4 weeks before treatment starts, during your cancer treatment, including dose interruptions, and for at least 4 weeks after treatment. A highly effective method of birth control includes tubal ligation, intrauterine device (IUD), hormonal (birth control pills, injections, patch and/or implants) or a partner's vasectomy. Stop taking lenalidomide immediately and let your doctor know right away if you think you may be pregnant, miss your menstrual period, or experience unusual menstrual bleeding.  . Men with male partners of childbearing potential should use effective methods of birth control during your cancer treatment and for at least 4 weeks after your cancer treatment. You should always wear a condom even if you have undergone a successful vasectomy. Let your doctor know right away if you think you may have impregnated your partner.  . Breastfeeding warning: Women should not breastfeed during treatment because this drug could enter the breast milk and cause harm to a breastfeeding baby.  . Fertility warning: Human fertility studies have not been done with this drug. Talk with your doctor or nurse if you plan to have children. Ask for information on sperm or egg banking.    SELF CARE ACTIVITIES WHILE RECEIVING CHEMOTHERAPY:  Hydration Increase your fluid intake 48 hours prior to treatment and  drink at least 8 to 12 cups (64 ounces) of water/decaffeinated beverages per day after treatment. You can still have your cup of coffee or soda but these beverages do not count as part of your 8 to 12 cups that you need to drink daily. No alcohol intake.  Medications Continue taking your normal prescription medication as prescribed.  If you start any new herbal or new supplements please let us know first to make sure it is safe.  Mouth Care Have teeth cleaned professionally before starting treatment. Keep dentures and partial plates clean. Use soft toothbrush and do not use mouthwashes that contain alcohol. Biotene is a good mouthwash that is available at most pharmacies or may be ordered by calling [phone]. Use warm salt water gargles (1 teaspoon salt per 1 quart warm water) before and after meals and at bedtime. If you need dental work, please let the doctor know before you go for your appointment so that we can coordinate the best possible time for you in regards to your chemo regimen. You need to also let your dentist know that you are actively taking chemo. We may need to do labs prior to your dental appointment.  Skin Care Always use sunscreen that has not expired and with SPF (Sun Protection Factor) of 50 or higher. Wear hats to protect your head from the sun. Remember to use sunscreen on your hands, ears, face, & feet.  Use good moisturizing lotions such as udder cream, eucerin, or even Vaseline. Some chemotherapies can cause dry skin, color changes in your skin and nails.    . Avoid long, hot showers or baths. . Use gentle, fragrance-free soaps and laundry detergent. . Use moisturizers, preferably creams or ointments rather than lotions because the thicker consistency is better at preventing skin dehydration. Apply the cream or ointment within 15 minutes of showering. Reapply moisturizer at night, and moisturize your hands every time after you wash them.  Hair Loss (if your doctor says  your hair will fall out)  . If your doctor says that your hair is likely to fall out, decide before you begin chemo whether you want to wear a wig. You may want to shop before treatment to match your hair color. . Hats, turbans, and scarves can also camouflage hair loss, although some people prefer to leave their heads uncovered. If you go bare-headed outdoors, be sure to use sunscreen on your scalp. . Cut your hair short. It eases the inconvenience of shedding lots of hair, but it also can reduce the emotional impact of watching your hair fall out. . Don't perm or color your hair during chemotherapy. Those chemical treatments are already damaging to hair and can enhance hair loss. Once your chemo treatments are done and your hair has grown back, it's OK to resume dyeing or perming hair.  With chemotherapy, hair loss is almost always temporary. But when it grows back, it may be a different color or texture. In older adults who still had hair color before chemotherapy, the new growth may be completely gray.  Often, new hair is very fine and soft.  Infection Prevention Please wash your hands for at least 30 seconds using warm soapy water. Handwashing is the #1 way to prevent the spread of germs. Stay away from sick people or people who are getting over a cold. If you develop respiratory systems such as green/yellow mucus production or productive cough or persistent cough let us know and we will see if you need an antibiotic. It is a good idea to keep a pair of gloves on when going into grocery stores/[employer] to decrease your risk of coming into contact with germs on the carts, etc. Carry alcohol hand gel with you at all times and use it frequently if out in public. If your temperature reaches 100.5 or higher please call the clinic and let us know.  If it is after hours or on the weekend please go to the ER if your temperature is over 100.5.  Please have your own personal thermometer at home to use.    Sex  and bodily fluids If you are going to have sex, a condom must be used to protect the person that isn't taking chemotherapy. Chemo can decrease your libido (sex drive). For a few days after chemotherapy, chemotherapy can be excreted through your bodily fluids.  When using the toilet please close the lid and flush the toilet twice.  Do this for a few day after you have had chemotherapy.   Effects of chemotherapy on your sex life Some changes are simple and won't last long. They won't affect your sex life permanently.  Sometimes you may feel: . too tired . not  strong enough to be very active . sick or sore  . not in the mood . anxious or low Your anxiety might not seem related to sex. For example, you may be worried about the cancer and how your treatment is going. Or you may be worried about money, or about how you family are coping with your illness.  These things can cause stress, which can affect your interest in sex. It's important to talk to your partner about how you feel.  Remember - the changes to your sex life don't usually last long. There's usually no medical reason to stop having sex during chemo. The drugs won't have any long term physical effects on your performance or enjoyment of sex. Cancer can't be passed on to your partner during sex  Contraception It's important to use reliable contraception during treatment. Avoid getting pregnant while you or your partner are having chemotherapy. This is because the drugs may harm the baby. Sometimes chemotherapy drugs can leave a man or woman infertile.  This means you would not be able to have children in the future. You might want to talk to someone about permanent infertility. It can be very difficult to learn that you may no longer be able to have children. Some people find counselling helpful. There might be ways to preserve your fertility, although this is easier for men than for women. You may want to speak to a fertility expert. You can  talk about sperm banking or harvesting your eggs. You can also ask about other fertility options, such as donor eggs. If you have or have had breast cancer, your doctor might advise you not to take the contraceptive pill. This is because the hormones in it might affect the cancer. It is not known for sure whether or not chemotherapy drugs can be passed on through semen or secretions from the vagina. Because of this some doctors advise people to use a barrier method if you have sex during treatment. This applies to vaginal, anal or oral sex. Generally, doctors advise a barrier method only for the time you are actually having the treatment and for about a week after your treatment. Advice like this can be worrying, but this does not mean that you have to avoid being intimate with your partner. You can still have close contact with your partner and continue to enjoy sex.  Animals If you have cats or birds we just ask that you not change the litter or change the cage.  Please have someone else do this for you while you are on chemotherapy.   Food Safety During and After Cancer Treatment Food safety is important for people both during and after cancer treatment. Cancer and cancer treatments, such as chemotherapy, radiation therapy, and stem cell/bone marrow transplantation, often weaken the immune system. This makes it harder for your body to protect itself from foodborne illness, also called food poisoning. Foodborne illness is caused by eating food that contains harmful bacteria, parasites, or viruses.  Foods to avoid Some foods have a higher risk of becoming tainted with bacteria. These include: Marland Kitchen Unwashed fresh fruit and vegetables, especially leafy vegetables that can hide dirt and other contaminants . Raw sprouts, such as alfalfa sprouts . Raw or undercooked beef, especially ground beef, or other raw or undercooked meat and poultry . Fatty, fried, or spicy foods immediately before or after treatment.   These can sit heavy on your stomach and make you feel nauseous. . Raw or undercooked shellfish, such as  oysters. . Sushi and sashimi, which often contain raw fish.  . Unpasteurized beverages, such as unpasteurized fruit juices, raw milk, raw yogurt, or cider . Undercooked eggs, such as soft boiled, over easy, and poached; raw, unpasteurized eggs; or foods made with raw egg, such as homemade raw cookie dough and homemade mayonnaise  Simple steps for food safety  Shop smart. . Do not buy food stored or displayed in an unclean area. . Do not buy bruised or damaged fruits or vegetables. . Do not buy cans that have cracks, dents, or bulges. . Pick up foods that can spoil at the end of your shopping trip and store them in a cooler on the way home.  Prepare and clean up foods carefully. . Rinse all fresh fruits and vegetables under running water, and dry them with a clean towel or paper towel. . Clean the top of cans before opening them. . After preparing food, wash your hands for 20 seconds with hot water and soap. Pay special attention to areas between fingers and under nails. . Clean your utensils and dishes with hot water and soap. Marland Kitchen Disinfect your kitchen and cutting boards using 1 teaspoon of liquid, unscented bleach mixed into 1 quart of water.    Dispose of old food. . Eat canned and packaged food before its expiration date (the "use by" or "best before" date). . Consume refrigerated leftovers within 3 to 4 days. After that time, throw out the food. Even if the food does not smell or look spoiled, it still may be unsafe. Some bacteria, such as Listeria, can grow even on foods stored in the refrigerator if they are kept for too long.  Take precautions when eating out. . At restaurants, avoid buffets and salad bars where food sits out for a long time and comes in contact with many people. Food can become contaminated when someone with a virus, often a norovirus, or another "bug" handles  it. . Put any leftover food in a "to-go" container yourself, rather than having the server do it. And, refrigerate leftovers as soon as you get home. . Choose restaurants that are clean and that are willing to prepare your food as you order it cooked.   AT HOME MEDICATIONS:                                                                                                                                                                Compazine/Prochlorperazine 10mg  tablet. Take 1 tablet every 6 hours as needed for nausea/vomiting. (This can make you sleepy)   EMLA cream. Apply a quarter size amount to port site 1 hour prior to chemo. Do not rub in. Cover with plastic wrap.    Diarrhea Sheet   If you are having loose stools/diarrhea,  please purchase Imodium and begin taking as outlined:  At the first sign of poorly formed or loose stools you should begin taking Imodium (loperamide) 2 mg capsules.  Take two tablets (4mg ) followed by one tablet (2mg ) every 2 hours - DO NOT EXCEED 8 tablets in 24 hours.  If it is bedtime and you are having loose stools, take 2 tablets at bedtime, then 2 tablets every 4 hours until morning.   Always call the [facility] if you are having loose stools/diarrhea that you can't get under control.  Loose stools/diarrhea leads to dehydration (loss of water) in your body.  We have other options of trying to get the loose stools/diarrhea to stop but you must let us know!   Constipation Sheet  Colace - 100 mg capsules - take 2 capsules daily.  If this doesn't help then you can increase to 2 capsules twice daily.  Please call if the above does not work for you. Do not go more than 2 days without a bowel movement.  It is very important that you do not become constipated.  It will make you feel sick to your stomach (nausea) and can cause abdominal pain and vomiting.  Nausea Sheet   Compazine/Prochlorperazine 10mg  tablet. Take 1 tablet every 6 hours as needed for  nausea/vomiting (This can make you drowsy).  If you are having persistent nausea (nausea that does not stop) please call the [facility] and let us know the amount of nausea that you are experiencing.  If you begin to vomit, you need to call the [facility] and if it is the weekend and you have vomited more than one time and can't get it to stop-go to the Emergency Room.  Persistent nausea/vomiting can lead to dehydration (loss of fluid in your body) and will make you feel very weak and unwell. Ice chips, sips of clear liquids, foods that are at room temperature, crackers, and toast tend to be better tolerated.   SYMPTOMS TO REPORT AS SOON AS POSSIBLE AFTER TREATMENT:  FEVER GREATER THAN 100.5 F  CHILLS WITH OR WITHOUT FEVER  NAUSEA AND VOMITING THAT IS NOT CONTROLLED WITH YOUR NAUSEA MEDICATION  UNUSUAL SHORTNESS OF BREATH  UNUSUAL BRUISING OR BLEEDING  TENDERNESS IN MOUTH AND THROAT WITH OR WITHOUT   PRESENCE OF ULCERS  URINARY PROBLEMS  BOWEL PROBLEMS  UNUSUAL RASH      Wear comfortable clothing and clothing appropriate for easy access to any Portacath or PICC line. Let us know if there is anything that we can do to make your therapy better!    What to do if you need assistance after hours or on the weekends: CALL [phone].  HOLD on the line, do not hang up.  You will hear multiple messages but at the end you will be connected with a nurse triage line.  They will contact the doctor if necessary.  Most of the time they will be able to assist you.  Do not call the hospital operator.      I have been informed and understand all of the instructions given to me and have received a copy. I have been instructed to call the clinic [phone] or my family physician as soon as possible for continued medical care, if indicated. I do not have any more questions at this time but understand that I may call the [facility] or the Patient Navigator at [phone] during  office hours should I have questions or need assistance in obtaining  follow-up care.

## 2020-07-26 ENCOUNTER — Telehealth: Payer: Self-pay

## 2020-07-26 ENCOUNTER — Encounter (HOSPITAL_COMMUNITY): Payer: Self-pay | Admitting: *Deleted

## 2020-07-26 NOTE — Progress Notes (Signed)
Whitney from [facility] [phone]) called and left voicemail over the weekend to advise that patient had a fall on Friday night around 6 pm.  He reported that he fell on his back and had no injuries.  Nurse noted no bruising.  She called just for informational purposes, nothing further needed on our part.

## 2020-07-26 NOTE — Telephone Encounter (Signed)
(  1:16pm) Palliative SW attempted to contact patient's daughter-Tammy. SW received her voicemail and left a message requesting a call back.

## 2020-07-27 ENCOUNTER — Encounter (HOSPITAL_COMMUNITY): Payer: Self-pay | Admitting: Hematology

## 2020-07-27 ENCOUNTER — Encounter (HOSPITAL_COMMUNITY): Payer: Self-pay | Admitting: Internal Medicine

## 2020-07-27 ENCOUNTER — Other Ambulatory Visit: Payer: Self-pay

## 2020-07-27 ENCOUNTER — Inpatient Hospital Stay (HOSPITAL_COMMUNITY): Payer: Medicare Other

## 2020-07-27 ENCOUNTER — Inpatient Hospital Stay (HOSPITAL_COMMUNITY): Payer: Medicare Other | Admitting: General Practice

## 2020-07-27 ENCOUNTER — Inpatient Hospital Stay (HOSPITAL_BASED_OUTPATIENT_CLINIC_OR_DEPARTMENT_OTHER): Payer: Medicare Other | Admitting: Hematology

## 2020-07-27 ENCOUNTER — Inpatient Hospital Stay (HOSPITAL_COMMUNITY)
Admission: AD | Admit: 2020-07-27 | Discharge: 2020-07-29 | DRG: 641 | Disposition: A | Discharge status: Hospice | Payer: Medicare Other | Source: Ambulatory Visit | Attending: Internal Medicine | Admitting: Internal Medicine

## 2020-07-27 VITALS — BP 107/60 | HR 78 | Temp 97.1°F | Resp 18

## 2020-07-27 VITALS — BP 99/50 | HR 74 | Temp 96.9°F | Resp 17

## 2020-07-27 DIAGNOSIS — C8336 Diffuse large B-cell lymphoma, intrapelvic lymph nodes: Secondary | ICD-10-CM

## 2020-07-27 DIAGNOSIS — C8513 Unspecified B-cell lymphoma, intra-abdominal lymph nodes: Secondary | ICD-10-CM

## 2020-07-27 DIAGNOSIS — R338 Other retention of urine: Secondary | ICD-10-CM | POA: Diagnosis present

## 2020-07-27 DIAGNOSIS — R400 Somnolence: Secondary | ICD-10-CM | POA: Diagnosis present

## 2020-07-27 DIAGNOSIS — D702 Other drug-induced agranulocytosis: Secondary | ICD-10-CM

## 2020-07-27 DIAGNOSIS — G8929 Other chronic pain: Secondary | ICD-10-CM | POA: Diagnosis present

## 2020-07-27 DIAGNOSIS — Z6823 Body mass index (BMI) 23.0-23.9, adult: Secondary | ICD-10-CM

## 2020-07-27 DIAGNOSIS — Z7984 Long term (current) use of oral hypoglycemic drugs: Secondary | ICD-10-CM

## 2020-07-27 DIAGNOSIS — F329 Major depressive disorder, single episode, unspecified: Secondary | ICD-10-CM | POA: Diagnosis present

## 2020-07-27 DIAGNOSIS — D638 Anemia in other chronic diseases classified elsewhere: Secondary | ICD-10-CM | POA: Diagnosis present

## 2020-07-27 DIAGNOSIS — Z960 Presence of urogenital implants: Secondary | ICD-10-CM | POA: Diagnosis present

## 2020-07-27 DIAGNOSIS — Z809 Family history of malignant neoplasm, unspecified: Secondary | ICD-10-CM

## 2020-07-27 DIAGNOSIS — Z20822 Contact with and (suspected) exposure to covid-19: Secondary | ICD-10-CM | POA: Diagnosis present

## 2020-07-27 DIAGNOSIS — C8338 Diffuse large B-cell lymphoma, lymph nodes of multiple sites: Secondary | ICD-10-CM

## 2020-07-27 DIAGNOSIS — Z95828 Presence of other vascular implants and grafts: Secondary | ICD-10-CM

## 2020-07-27 DIAGNOSIS — R102 Pelvic and perineal pain: Secondary | ICD-10-CM | POA: Diagnosis present

## 2020-07-27 DIAGNOSIS — Z86711 Personal history of pulmonary embolism: Secondary | ICD-10-CM

## 2020-07-27 DIAGNOSIS — Z87891 Personal history of nicotine dependence: Secondary | ICD-10-CM

## 2020-07-27 DIAGNOSIS — N401 Enlarged prostate with lower urinary tract symptoms: Secondary | ICD-10-CM | POA: Diagnosis present

## 2020-07-27 DIAGNOSIS — C8333 Diffuse large B-cell lymphoma, intra-abdominal lymph nodes: Secondary | ICD-10-CM | POA: Diagnosis present

## 2020-07-27 DIAGNOSIS — R590 Localized enlarged lymph nodes: Secondary | ICD-10-CM | POA: Diagnosis present

## 2020-07-27 DIAGNOSIS — I2699 Other pulmonary embolism without acute cor pulmonale: Secondary | ICD-10-CM | POA: Diagnosis present

## 2020-07-27 DIAGNOSIS — F419 Anxiety disorder, unspecified: Secondary | ICD-10-CM | POA: Diagnosis present

## 2020-07-27 DIAGNOSIS — Z7901 Long term (current) use of anticoagulants: Secondary | ICD-10-CM

## 2020-07-27 DIAGNOSIS — G250 Essential tremor: Secondary | ICD-10-CM | POA: Diagnosis not present

## 2020-07-27 DIAGNOSIS — Z8711 Personal history of peptic ulcer disease: Secondary | ICD-10-CM

## 2020-07-27 DIAGNOSIS — R296 Repeated falls: Secondary | ICD-10-CM | POA: Diagnosis present

## 2020-07-27 DIAGNOSIS — Z978 Presence of other specified devices: Secondary | ICD-10-CM | POA: Diagnosis not present

## 2020-07-27 DIAGNOSIS — E1122 Type 2 diabetes mellitus with diabetic chronic kidney disease: Secondary | ICD-10-CM | POA: Diagnosis present

## 2020-07-27 DIAGNOSIS — C8510 Unspecified B-cell lymphoma, unspecified site: Secondary | ICD-10-CM | POA: Diagnosis present

## 2020-07-27 DIAGNOSIS — M545 Low back pain, unspecified: Secondary | ICD-10-CM | POA: Diagnosis present

## 2020-07-27 DIAGNOSIS — D630 Anemia in neoplastic disease: Secondary | ICD-10-CM | POA: Diagnosis present

## 2020-07-27 DIAGNOSIS — G43909 Migraine, unspecified, not intractable, without status migrainosus: Secondary | ICD-10-CM | POA: Diagnosis present

## 2020-07-27 DIAGNOSIS — Z515 Encounter for palliative care: Secondary | ICD-10-CM

## 2020-07-27 DIAGNOSIS — E1142 Type 2 diabetes mellitus with diabetic polyneuropathy: Secondary | ICD-10-CM | POA: Diagnosis present

## 2020-07-27 DIAGNOSIS — Y92009 Unspecified place in unspecified non-institutional (private) residence as the place of occurrence of the external cause: Secondary | ICD-10-CM

## 2020-07-27 DIAGNOSIS — Z881 Allergy status to other antibiotic agents status: Secondary | ICD-10-CM

## 2020-07-27 DIAGNOSIS — N1832 Chronic kidney disease, stage 3b: Secondary | ICD-10-CM | POA: Diagnosis present

## 2020-07-27 DIAGNOSIS — I129 Hypertensive chronic kidney disease with stage 1 through stage 4 chronic kidney disease, or unspecified chronic kidney disease: Secondary | ICD-10-CM | POA: Diagnosis present

## 2020-07-27 DIAGNOSIS — M19011 Primary osteoarthritis, right shoulder: Secondary | ICD-10-CM | POA: Diagnosis present

## 2020-07-27 DIAGNOSIS — R627 Adult failure to thrive: Secondary | ICD-10-CM | POA: Diagnosis present

## 2020-07-27 DIAGNOSIS — R339 Retention of urine, unspecified: Secondary | ICD-10-CM | POA: Diagnosis present

## 2020-07-27 DIAGNOSIS — Z9221 Personal history of antineoplastic chemotherapy: Secondary | ICD-10-CM

## 2020-07-27 DIAGNOSIS — Z23 Encounter for immunization: Secondary | ICD-10-CM

## 2020-07-27 DIAGNOSIS — W19XXXA Unspecified fall, initial encounter: Secondary | ICD-10-CM | POA: Diagnosis present

## 2020-07-27 DIAGNOSIS — R05 Cough: Secondary | ICD-10-CM | POA: Diagnosis present

## 2020-07-27 DIAGNOSIS — M79605 Pain in left leg: Secondary | ICD-10-CM | POA: Diagnosis present

## 2020-07-27 DIAGNOSIS — Z79899 Other long term (current) drug therapy: Secondary | ICD-10-CM

## 2020-07-27 DIAGNOSIS — R531 Weakness: Secondary | ICD-10-CM

## 2020-07-27 DIAGNOSIS — G893 Neoplasm related pain (acute) (chronic): Secondary | ICD-10-CM | POA: Diagnosis present

## 2020-07-27 DIAGNOSIS — E785 Hyperlipidemia, unspecified: Secondary | ICD-10-CM | POA: Diagnosis present

## 2020-07-27 DIAGNOSIS — Z8249 Family history of ischemic heart disease and other diseases of the circulatory system: Secondary | ICD-10-CM

## 2020-07-27 DIAGNOSIS — C787 Secondary malignant neoplasm of liver and intrahepatic bile duct: Secondary | ICD-10-CM | POA: Diagnosis present

## 2020-07-27 DIAGNOSIS — Z66 Do not resuscitate: Secondary | ICD-10-CM | POA: Diagnosis present

## 2020-07-27 LAB — CBC WITH DIFFERENTIAL/PLATELET
Abs Immature Granulocytes: 0.04 10*3/uL (ref 0.00–0.07)
Basophils Absolute: 0.1 10*3/uL (ref 0.0–0.1)
Basophils Relative: 1 %
Eosinophils Absolute: 0.2 10*3/uL (ref 0.0–0.5)
Eosinophils Relative: 4 %
HCT: 30.3 % — ABNORMAL LOW (ref 39.0–52.0)
Hemoglobin: 9.5 g/dL — ABNORMAL LOW (ref 13.0–17.0)
Immature Granulocytes: 1 %
Lymphocytes Relative: 15 %
Lymphs Abs: 0.9 10*3/uL (ref 0.7–4.0)
MCH: 32.4 pg (ref 26.0–34.0)
MCHC: 31.4 g/dL (ref 30.0–36.0)
MCV: 103.4 fL — ABNORMAL HIGH (ref 80.0–100.0)
Monocytes Absolute: 0.8 10*3/uL (ref 0.1–1.0)
Monocytes Relative: 13 %
Neutro Abs: 3.9 10*3/uL (ref 1.7–7.7)
Neutrophils Relative %: 66 %
Platelets: 374 10*3/uL (ref 150–400)
RBC: 2.93 MIL/uL — ABNORMAL LOW (ref 4.22–5.81)
RDW: 14 % (ref 11.5–15.5)
WBC: 5.8 10*3/uL (ref 4.0–10.5)
nRBC: 0 % (ref 0.0–0.2)

## 2020-07-27 LAB — MRSA PCR SCREENING: MRSA by PCR: NEGATIVE

## 2020-07-27 LAB — COMPREHENSIVE METABOLIC PANEL
ALT: 14 U/L (ref 0–44)
AST: 16 U/L (ref 15–41)
Albumin: 3.1 g/dL — ABNORMAL LOW (ref 3.5–5.0)
Alkaline Phosphatase: 111 U/L (ref 38–126)
Anion gap: 9 (ref 5–15)
BUN: 29 mg/dL — ABNORMAL HIGH (ref 8–23)
CO2: 26 mmol/L (ref 22–32)
Calcium: 11.7 mg/dL — ABNORMAL HIGH (ref 8.9–10.3)
Chloride: 101 mmol/L (ref 98–111)
Creatinine, Ser: 1.74 mg/dL — ABNORMAL HIGH (ref 0.61–1.24)
GFR calc Af Amer: 43 mL/min — ABNORMAL LOW (ref >60)
GFR calc non Af Amer: 37 mL/min — ABNORMAL LOW (ref >60)
Glucose, Bld: 140 mg/dL — ABNORMAL HIGH (ref 70–99)
Potassium: 4.6 mmol/L (ref 3.5–5.1)
Sodium: 136 mmol/L (ref 135–145)
Total Bilirubin: 0.4 mg/dL (ref 0.3–1.2)
Total Protein: 7.6 g/dL (ref 6.5–8.1)

## 2020-07-27 LAB — GLUCOSE, CAPILLARY: Glucose-Capillary: 123 mg/dL — ABNORMAL HIGH (ref 70–99)

## 2020-07-27 LAB — MAGNESIUM: Magnesium: 2 mg/dL (ref 1.7–2.4)

## 2020-07-27 LAB — URIC ACID: Uric Acid, Serum: 3.2 mg/dL — ABNORMAL LOW (ref 3.7–8.6)

## 2020-07-27 LAB — SARS CORONAVIRUS 2 BY RT PCR (HOSPITAL ORDER, PERFORMED IN ~~LOC~~ HOSPITAL LAB): SARS Coronavirus 2: NEGATIVE

## 2020-07-27 LAB — LACTATE DEHYDROGENASE: LDH: 164 U/L (ref 98–192)

## 2020-07-27 MED ORDER — HYDROMORPHONE HCL 1 MG/ML IJ SOLN
0.5000 mg | INTRAMUSCULAR | Status: DC | PRN
  Administered 2020-07-28 (×2): 1 mg via INTRAVENOUS
  Administered 2020-07-28: 0.5 mg via INTRAVENOUS
  Administered 2020-07-28: 1 mg via INTRAVENOUS
  Filled 2020-07-27 (×4): qty 1

## 2020-07-27 MED ORDER — PROPRANOLOL HCL 20 MG PO TABS
40.0000 mg | ORAL_TABLET | Freq: Two times a day (BID) | ORAL | Status: DC
  Administered 2020-07-27 – 2020-07-29 (×4): 40 mg via ORAL
  Filled 2020-07-27 (×4): qty 2

## 2020-07-27 MED ORDER — SODIUM CHLORIDE 0.9 % IV SOLN: INTRAVENOUS | Status: AC

## 2020-07-27 MED ORDER — TOPIRAMATE 100 MG PO TABS
200.0000 mg | ORAL_TABLET | Freq: Every day | ORAL | Status: DC
  Administered 2020-07-27 – 2020-07-28 (×2): 200 mg via ORAL
  Filled 2020-07-27 (×2): qty 2

## 2020-07-27 MED ORDER — SODIUM CHLORIDE 0.9 % IV SOLN: Freq: Once | INTRAVENOUS | Status: DC

## 2020-07-27 MED ORDER — ACETAMINOPHEN 650 MG RE SUPP: 650.0000 mg | Freq: Four times a day (QID) | RECTAL | Status: DC | PRN

## 2020-07-27 MED ORDER — HYDROCODONE-ACETAMINOPHEN 10-325 MG PO TABS
1.0000 | ORAL_TABLET | Freq: Four times a day (QID) | ORAL | Status: DC | PRN
  Administered 2020-07-27 – 2020-07-28 (×2): 1 via ORAL
  Filled 2020-07-27 (×4): qty 1

## 2020-07-27 MED ORDER — LENALIDOMIDE 10 MG PO CAPS: 10.0000 mg | ORAL_CAPSULE | Freq: Every day | ORAL | Status: DC

## 2020-07-27 MED ORDER — SODIUM CHLORIDE 0.9 % IV SOLN
20.0000 mg | Freq: Once | INTRAVENOUS | Status: AC
  Administered 2020-07-27: 20 mg via INTRAVENOUS
  Filled 2020-07-27: qty 20

## 2020-07-27 MED ORDER — ONDANSETRON HCL 4 MG/2ML IJ SOLN: 4.0000 mg | Freq: Four times a day (QID) | INTRAMUSCULAR | Status: DC | PRN

## 2020-07-27 MED ORDER — HEPARIN SOD (PORK) LOCK FLUSH 100 UNIT/ML IV SOLN
500.0000 [IU] | Freq: Once | INTRAVENOUS | Status: AC | PRN
  Administered 2020-07-27: 500 [IU]

## 2020-07-27 MED ORDER — TEMAZEPAM 15 MG PO CAPS
15.0000 mg | ORAL_CAPSULE | Freq: Every day | ORAL | Status: DC
  Administered 2020-07-27 – 2020-07-28 (×2): 15 mg via ORAL
  Filled 2020-07-27: qty 2

## 2020-07-27 MED ORDER — ALLOPURINOL 300 MG PO TABS
300.0000 mg | ORAL_TABLET | Freq: Every day | ORAL | Status: DC
  Administered 2020-07-28 – 2020-07-29 (×2): 300 mg via ORAL
  Filled 2020-07-27 (×2): qty 1

## 2020-07-27 MED ORDER — SODIUM CHLORIDE 0.9 % IV SOLN
6.0000 mg | Freq: Once | INTRAVENOUS | Status: AC
  Administered 2020-07-27: 6 mg via INTRAVENOUS
  Filled 2020-07-27: qty 4

## 2020-07-27 MED ORDER — ONDANSETRON HCL 4 MG PO TABS: 4.0000 mg | ORAL_TABLET | Freq: Four times a day (QID) | ORAL | Status: DC | PRN

## 2020-07-27 MED ORDER — ZOLEDRONIC ACID 4 MG/100ML IV SOLN
4.0000 mg | Freq: Once | INTRAVENOUS | Status: AC
  Administered 2020-07-27: 4 mg via INTRAVENOUS
  Filled 2020-07-27: qty 100

## 2020-07-27 MED ORDER — TAFASITAMAB-CXIX CHEMO INJECTION 200 MG
12.0000 mg/kg | Freq: Once | INTRAVENOUS | Status: AC
  Administered 2020-07-27: 1000 mg via INTRAVENOUS
  Filled 2020-07-27: qty 25

## 2020-07-27 MED ORDER — PROCHLORPERAZINE MALEATE 10 MG PO TABS: 10.0000 mg | ORAL_TABLET | Freq: Four times a day (QID) | ORAL | 1 refills | Status: DC | PRN

## 2020-07-27 MED ORDER — SODIUM CHLORIDE 0.9 % IV SOLN: Freq: Once | INTRAVENOUS | Status: AC

## 2020-07-27 MED ORDER — FAMOTIDINE IN NACL 20-0.9 MG/50ML-% IV SOLN
20.0000 mg | Freq: Once | INTRAVENOUS | Status: AC
  Administered 2020-07-27: 20 mg via INTRAVENOUS
  Filled 2020-07-27: qty 50

## 2020-07-27 MED ORDER — PANTOPRAZOLE SODIUM 40 MG PO TBEC
40.0000 mg | DELAYED_RELEASE_TABLET | Freq: Two times a day (BID) | ORAL | Status: DC
  Administered 2020-07-28 – 2020-07-29 (×3): 40 mg via ORAL
  Filled 2020-07-27 (×3): qty 1

## 2020-07-27 MED ORDER — SODIUM CHLORIDE 0.45 % IV SOLN: INTRAVENOUS | Status: DC

## 2020-07-27 MED ORDER — APIXABAN 5 MG PO TABS
5.0000 mg | ORAL_TABLET | Freq: Two times a day (BID) | ORAL | Status: DC
  Administered 2020-07-27 – 2020-07-29 (×4): 5 mg via ORAL
  Filled 2020-07-27 (×4): qty 1

## 2020-07-27 MED ORDER — INSULIN ASPART 100 UNIT/ML ~~LOC~~ SOLN
0.0000 [IU] | Freq: Three times a day (TID) | SUBCUTANEOUS | Status: DC
  Administered 2020-07-28: 2 [IU] via SUBCUTANEOUS

## 2020-07-27 MED ORDER — ACETAMINOPHEN 325 MG PO TABS
650.0000 mg | ORAL_TABLET | Freq: Once | ORAL | Status: AC
  Administered 2020-07-27: 650 mg via ORAL
  Filled 2020-07-27: qty 2

## 2020-07-27 MED ORDER — INSULIN ASPART 100 UNIT/ML ~~LOC~~ SOLN: 0.0000 [IU] | Freq: Every day | SUBCUTANEOUS | Status: DC

## 2020-07-27 MED ORDER — CITALOPRAM HYDROBROMIDE 20 MG PO TABS
40.0000 mg | ORAL_TABLET | Freq: Every day | ORAL | Status: DC
  Administered 2020-07-27 – 2020-07-28 (×2): 40 mg via ORAL
  Filled 2020-07-27 (×2): qty 2

## 2020-07-27 MED ORDER — GABAPENTIN 300 MG PO CAPS
600.0000 mg | ORAL_CAPSULE | Freq: Three times a day (TID) | ORAL | Status: DC
  Administered 2020-07-27 – 2020-07-29 (×6): 600 mg via ORAL
  Filled 2020-07-27 (×6): qty 2

## 2020-07-27 MED ORDER — PRIMIDONE 50 MG PO TABS
100.0000 mg | ORAL_TABLET | Freq: Two times a day (BID) | ORAL | Status: DC
  Administered 2020-07-27 – 2020-07-29 (×4): 100 mg via ORAL
  Filled 2020-07-27 (×8): qty 2

## 2020-07-27 MED ORDER — CYCLOBENZAPRINE HCL 10 MG PO TABS
10.0000 mg | ORAL_TABLET | Freq: Every day | ORAL | Status: DC
  Administered 2020-07-27 – 2020-07-28 (×2): 10 mg via ORAL
  Filled 2020-07-27 (×2): qty 1

## 2020-07-27 MED ORDER — DIPHENHYDRAMINE HCL 50 MG/ML IJ SOLN
50.0000 mg | Freq: Once | INTRAMUSCULAR | Status: AC
  Administered 2020-07-27: 50 mg via INTRAVENOUS
  Filled 2020-07-27: qty 1

## 2020-07-27 MED ORDER — ACETAMINOPHEN 325 MG PO TABS
650.0000 mg | ORAL_TABLET | Freq: Four times a day (QID) | ORAL | Status: DC | PRN
  Administered 2020-07-29: 650 mg via ORAL
  Filled 2020-07-27: qty 2

## 2020-07-27 MED ORDER — SODIUM CHLORIDE 0.9% FLUSH
10.0000 mL | INTRAVENOUS | Status: DC | PRN
  Administered 2020-07-27: 10 mL

## 2020-07-27 NOTE — Progress Notes (Signed)
[facility] CSW Progress Notes    Call to [facility] admissions.  They received FL2 faxed from [facility] last week, it was reviewed and forwarded to their business office.  Business office was to call family to discuss options.  It is unclear whether [facility] has contacted family.  Per [facility], they do not have any long term beds at this time.   Per daughter, patient has had several falls in recent past and is severely weak and deconditioned.  Per daughter, patient has had two COVID vaccines at [facility], second shot approx 1.5 months ago.  She will obtain verification of these vaccines.  She will also contact [place] Medicaid office to determine what paperwork is needed to initiate a Medicaid application if he needs long term SNF level care vs SNF rehab stay.  Advised daughter that if patient is admitted to an inpatient bed, inpatient [facility] team will be in charge of discharge plan.  They may need to broaden their bed search from [facility] if beds are not available there.  They are open to other facilities, including [facility].    Edwyna Shell, LCSW Clinical Social Worker Phone:  [phone] Cell:  [phone]

## 2020-07-27 NOTE — Progress Notes (Signed)
.   Pharmacist Chemotherapy Monitoring - Initial Assessment    Anticipated start date: 07/27/20   Regimen:  . Are orders appropriate based on the patient's diagnosis, regimen, and cycle? Yes . Does the plan date match the patient's scheduled date? Yes . Is the sequencing of drugs appropriate? Yes . Are the premedications appropriate for the patient's regimen? Yes . Prior Authorization for treatment is: Approved o If applicable, is the correct biosimilar selected based on the patient's insurance? not applicable  Organ Function and Labs: Marland Kitchen Are dose adjustments needed based on the patient's renal function, hepatic function, or hematologic function? No . Are appropriate labs ordered prior to the start of patient's treatment? Yes . Other organ system assessment, if indicated: N/A . The following baseline labs, if indicated, have been ordered: N/A  Dose Assessment: . Are the drug doses appropriate? Yes . Are the following correct: o Drug concentrations Yes o IV fluid compatible with drug Yes o Administration routes Yes o Timing of therapy Yes . If applicable, does the patient have documented access for treatment and/or plans for port-a-cath placement? yes . If applicable, have lifetime cumulative doses been properly documented and assessed? not applicable Lifetime Dose Tracking  . Doxorubicin: 159.661 mg/m2 (312 mg) = 35.48 % of the maximum lifetime dose of 450 mg/m2  o   Toxicity Monitoring/Prevention: . The patient has the following take home antiemetics prescribed: Prochlorperazine . 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 Medication allergies and previous infusion related reactions, if applicable, have been reviewed and addressed. Yes . The patient's current medication list has been assessed for drug-drug interactions with their chemotherapy regimen. no significant drug-drug interactions were identified on review.  Order  Review: . Are the treatment plan orders signed? No . Is the patient scheduled to see a provider prior to their treatment? Yes  I verify that I have reviewed each item in the above checklist and answered each question accordingly.  Wynona Neat 07/27/2020 9:44 AM

## 2020-07-27 NOTE — Progress Notes (Signed)
Daniel Richards, [place] [id number]   CLINIC:  Medical Oncology/Hematology  PCP:  [name], FNP [facility] [address] [place] [address]  [phone]  REASON FOR VISIT:  Follow-up for stage IV DLBCL  PRIOR THERAPY:  1. R mini CHOP x 6 cycles from 08/21/2019 to 01/21/2020. 2. Radiation therapy x 10 treatments from 05/27/2020 to 06/09/2020.  CURRENT THERAPY: Observation  INTERVAL HISTORY:  Daniel Richards, a 74 y.o. male, returns for routine follow-up for his stage IV DLBCL. Daniel Richards was last seen on 07/22/2020.  Today he is accompanied by his daughter. He reportedly had another fall on 9/17. He continues having pain in his left hip and knee and not being able to lift or use it. He denies having N/V. He has not been drinking as much water as before. He denies having any dyspnea or SOB.  He spends most of the day in bed.   REVIEW OF SYSTEMS:  Review of Systems  Constitutional: Positive for appetite change (severely decreased) and fatigue (depleted).  Respiratory: Negative for shortness of breath.   Gastrointestinal: Positive for diarrhea. Negative for nausea and vomiting.  Genitourinary: Positive for difficulty urinating (Foley in place).   Musculoskeletal: Positive for arthralgias (7/10 L hip and knee pain).  Neurological: Positive for headaches.  All other systems reviewed and are negative.   PAST MEDICAL/SURGICAL HISTORY:  Past Medical History:  Diagnosis Date   Acid reflux    Anxiety and depression    Cancer ([place])    lymphoma   Depression    Diabetes ([place])    Diabetes mellitus 06/09/2011   pt taking metformin   DJD of shoulder    right    Hyperlipidemia    Hypertension    Lymphoma ([place])    Migraines    Port-A-Cath in place 08/13/2019   Pulmonary embolism ([place])    Stomach ulcer    Tremor    Tremor    Vitamin D deficiency    Past Surgical History:  Procedure Laterality  Date   BACK SURGERY     KNEE SURGERY     PORTACATH PLACEMENT Left 08/08/2019   Procedure: INSERTION PORT-A-CATH;  Surgeon: Aviva Signs, MD;  Location: [facility];  Service: General;  Laterality: Left;   SHOULDER SURGERY      SOCIAL HISTORY:  Social History   Socioeconomic History   Marital status: Divorced    Spouse name: Not on file   Number of children: 2   Years of education: 12+   Highest education level: Not on file  Occupational History   Not on file  Tobacco Use   Smoking status: Former Smoker    Packs/day: 2.00    Years: 20.00    Pack years: 40.00    Types: Cigarettes    Quit date: 11/17/1989    Years since quitting: 30.7   Smokeless tobacco: Never Used  Vaping Use   Vaping Use: Never used  Substance and Sexual Activity   Alcohol use: No   Drug use: No   Sexual activity: Not Currently  Other Topics Concern   Not on file  Social History Narrative   Lives at home with son.   Caffeine use: Drinks 1 cup coffee (3 cups per week-decaf)   Social Determinants of Health   Financial Resource Strain: Medium Risk   Difficulty of Paying Living Expenses: Somewhat hard  Food Insecurity: No Food Insecurity   Worried About Running  Out of Food in the Last Year: Never true   Ran Out of Food in the Last Year: Never true  Transportation Needs: No Transportation Needs   Lack of Transportation (Medical): No   Lack of Transportation (Non-Medical): No  Physical Activity: Inactive   Days of Exercise per Week: 0 days   Minutes of Exercise per Session: 0 min  Stress: No Stress Concern Present   Feeling of Stress : Not at all  Social Connections: Socially Isolated   Frequency of Communication with Friends and Family: Never   Frequency of Social Gatherings with Friends and Family: Twice a week   Attends Religious Services: More than 4 times per year   Active Member of [employer] or Organizations: No   Attends Music therapist: Never   Marital  Status: Divorced  Human resources officer Violence: Not At Risk   Fear of Current or Ex-Partner: No   Emotionally Abused: No   Physically Abused: No   Sexually Abused: No    FAMILY HISTORY:  Family History  Problem Relation Age of Onset   Cancer Sister    Cancer Sister    Cancer Brother    Heart attack Father    Cancer Brother    Heart attack Brother    Healthy Son    Healthy Daughter    Migraines Neg Hx     CURRENT MEDICATIONS:  Current Outpatient Medications  Medication Sig Dispense Refill   allopurinol (ZYLOPRIM) 300 MG tablet Take 1 tablet (300 mg total) by mouth daily. 30 tablet 0   citalopram (CELEXA) 20 MG tablet Take 1 tablet (20 mg total) by mouth at bedtime. (Patient taking differently: Take 40 mg by mouth at bedtime. ) 30 tablet 0   cyclobenzaprine (FLEXERIL) 10 MG tablet Take 10 mg by mouth at bedtime.     CYCLOPHOSPHAMIDE IV Inject into the vein every 21 ( twenty-one) days.  (Patient not taking: Reported on 07/22/2020)     DOXORUBICIN HCL IV Inject into the vein every 21 ( twenty-one) days.  (Patient not taking: Reported on 07/22/2020)     ELIQUIS 5 MG TABS tablet Take 5 mg by mouth 2 (two) times daily.     FARXIGA 10 MG TABS tablet Take 10 mg by mouth daily.     gabapentin (NEURONTIN) 300 MG capsule Take 2 capsules (600 mg total) by mouth 3 (three) times daily. 90 capsule 0   HYDROcodone-acetaminophen (NORCO) 10-325 MG tablet Take 1 tablet by mouth every 6 (six) hours as needed for moderate pain or severe pain.      lenalidomide (REVLIMID) 10 MG capsule Take 1 capsule (10 mg total) by mouth daily. [employer] Auth # [id number]     Date Obtained 07/22/2020 21 capsule 0   metFORMIN (GLUCOPHAGE) 500 MG tablet Take 1 tablet (500 mg total) by mouth daily. 30 tablet 0   pantoprazole (PROTONIX) 40 MG tablet Take 1 tablet (40 mg total) by mouth 2 (two) times daily before a meal. 60 tablet 0   primidone (MYSOLINE) 50 MG tablet Take 1.5 tablets (75 mg total) by  mouth every 8 (eight) hours. (Patient taking differently: Take 100 mg by mouth 2 (two) times daily. ) 150 tablet 0   propranolol (INDERAL) 40 MG tablet Take 1 tablet (40 mg total) by mouth 2 (two) times daily. 60 tablet 0   riTUXimab in sodium chloride 0.9 % 250 mL Inject into the vein every 21 ( twenty-one) days.  (Patient not taking: Reported on 07/22/2020)  temazepam (RESTORIL) 15 MG capsule Take 1 capsule (15 mg total) by mouth at bedtime. 15 capsule 0   topiramate (TOPAMAX) 100 MG tablet Take 2 tablets (200 mg total) by mouth at bedtime. 60 tablet 0   vinCRIStine 2 mg in sodium chloride 0.9 % 50 mL Inject 2 mg into the vein every 21 ( twenty-one) days.  (Patient not taking: Reported on 07/22/2020)     No current facility-administered medications for this visit.   Facility-Administered Medications Ordered in Other Visits  Medication Dose Route Frequency Provider Last Rate Last Admin   0.9 %  sodium chloride infusion   Intravenous Continuous Derek Jack, MD        ALLERGIES:  Allergies  Allergen Reactions   Vancomycin Hives    Patient received Benadryl to treat the hives    PHYSICAL EXAM:  Performance status (ECOG): 2 - Symptomatic, <50% confined to bed  Vitals:   07/27/20 0843  BP: (!) 99/50  Pulse: 74  Resp: 17  Temp: (!) 96.9 F (36.1 C)  SpO2: 99%   Wt Readings from Last 3 Encounters:  07/22/20 175 lb 11.2 oz (79.7 kg)  07/13/20 180 lb (81.6 kg)  06/29/20 181 lb 12.8 oz (82.5 kg)   Physical Exam Vitals reviewed.  Constitutional:      Appearance: Normal appearance.  Cardiovascular:     Rate and Rhythm: Normal rate and regular rhythm.     Pulses: Normal pulses.     Heart sounds: Normal heart sounds.  Pulmonary:     Effort: Pulmonary effort is normal.     Breath sounds: Normal breath sounds.  Chest:     Comments: Port-a-Cath in L chest Neurological:     General: No focal deficit present.     Mental Status: He is alert and oriented to person,  place, and time.  Psychiatric:        Mood and Affect: Mood normal.        Behavior: Behavior normal.     LABORATORY DATA:  I have reviewed the labs as listed.  CBC Latest Ref Rng & Units 07/27/2020 07/22/2020 06/29/2020  WBC 4.0 - 10.5 K/uL 5.8 6.5 4.1  Hemoglobin 13.0 - 17.0 g/dL 9.5(L) 9.1(L) 9.8(L)  Hematocrit 39 - 52 % 30.3(L) 30.3(L) 32.6(L)  Platelets 150 - 400 K/uL 374 379 253   CMP Latest Ref Rng & Units 07/27/2020 07/22/2020 06/29/2020  Glucose 70 - 99 mg/dL 140(H) 154(H) 112(H)  BUN 8 - 23 mg/dL 29(H) 24(H) 31(H)  Creatinine 0.61 - 1.24 mg/dL 1.74(H) 1.94(H) 2.16(H)  Sodium 135 - 145 mmol/L 136 133(L) 140  Potassium 3.5 - 5.1 mmol/L 4.6 4.3 4.5  Chloride 98 - 111 mmol/L 101 100 105  CO2 22 - 32 mmol/L 26 23 27   Calcium 8.9 - 10.3 mg/dL 11.7(H) 9.8 9.2  Total Protein 6.5 - 8.1 g/dL 7.6 7.5 7.5  Total Bilirubin 0.3 - 1.2 mg/dL 0.4 0.4 0.4  Alkaline Phos 38 - 126 U/L 111 97 107  AST 15 - 41 U/L 16 15 16   ALT 0 - 44 U/L 14 14 17       Component Value Date/Time   RBC 2.93 (L) 07/27/2020 0841   MCV 103.4 (H) 07/27/2020 0841   MCH 32.4 07/27/2020 0841   MCHC 31.4 07/27/2020 0841   RDW 14.0 07/27/2020 0841   LYMPHSABS 0.9 07/27/2020 0841   MONOABS 0.8 07/27/2020 0841   EOSABS 0.2 07/27/2020 0841   BASOSABS 0.1 07/27/2020 0841   Lab Results  Component  Value Date   LDH 164 07/27/2020   LDH 184 07/22/2020   LDH 128 06/29/2020    DIAGNOSTIC IMAGING:  I have independently reviewed the scans and discussed with the patient. MR PELVIS W WO CONTRAST  Addendum Date: 07/19/2020   ADDENDUM REPORT: 07/19/2020 16:24 ADDENDUM: Dictation error in the musculoskeletal findings section above. Multiple lesions in the bilateral bony pelvis and proximal FEMURS, measuring up to 9 mm on the left, suggesting metastases/lymphomatous involvement. Remainder of the report, including the impression, is unchanged. Electronically Signed   By: Julian Hy M.D.   On: 07/19/2020 16:24   Result  Date: 07/19/2020 CLINICAL DATA:  Diffuse large B-cell lymphoma, pelvic lymphadenopathy EXAM: MRI PELVIS WITHOUT AND WITH CONTRAST TECHNIQUE: Multiplanar multisequence MR imaging of the pelvis was performed both before and after administration of intravenous contrast. CONTRAST:  46mL GADAVIST GADOBUTROL 1 MMOL/ML IV SOLN COMPARISON:  MRI pelvis dated 05/31/2020 FINDINGS: Urinary Tract: Irregular bladder wall thickening. Indwelling Foley catheter. Bowel: Wall thickening involving the left posterolateral rectum (series 15/image 29), new from the prior, likely reflecting tumor involvement. Vascular/Lymphatic: No evidence of aneurysm. Multifocal tumor along the left posterior pelvic sidewall. In aggregate, this measures 6.0 x 2.3 cm (series 11/image 22), previously 6.2 x 4.4 cm, improved. However, this likely reflects several smaller discrete nodes, including a 16 mm short axis node superiorly (series 12/image 19) and inferiorly (series 12/image 21). Tumor continues to involve the exiting S1-3 nerve roots on the left, particularly S2. New 15 mm short axis left external iliac node (series 12/image 20). New 11 mm short axis left deep inguinal node (series 12/image 23). Retroperitoneal lymphadenopathy, incompletely visualized, including a 2.0 cm short axis right common iliac node and a 1.9 cm short axis left common iliac node (series 12/image 8). Reproductive:  Prostatomegaly, suggesting BPH. Other:  No pelvic ascites. Musculoskeletal: Tumor involvement of the left iliopsoas musculature (series 15/images 1 and 5). Multiple lesions in the bilateral bony pelvis and proximal humerii, measuring up to 9 mm on the left (series 10/image 10), suggesting metastases/lymphomatous involvement. IMPRESSION: Retroperitoneal, left posterior pelvic, and left inguinal lymphadenopathy, as described above, progressive. Tumor likely involves the left iliopsoas musculature. Tumor continues to involve the exiting S1-3 nerve roots on the left,  particularly S2. Tumor likely involves the left posterolateral rectum. Irregular bladder wall thickening, poorly evaluated. Indwelling Foley catheter. Suspected multifocal osseous metastases/lymphomatous involvement. Correlate with pending PET-CT. Electronically Signed: By: Julian Hy M.D. On: 07/19/2020 10:37   NM PET Image Initial (PI) Skull Base To Thigh  Result Date: 07/19/2020 CLINICAL DATA:  Subsequent treatment strategy for diffuse large B-cell lymphoma. EXAM: NUCLEAR MEDICINE PET SKULL BASE TO THIGH TECHNIQUE: 8.8 mCi F-18 FDG was injected intravenously. Full-ring PET imaging was performed from the skull base to thigh after the radiotracer. CT data was obtained and used for attenuation correction and anatomic localization. Fasting blood glucose: 113 mg/dl COMPARISON:  MR pelvis dated 07/20/2019.  PET-CT dated 04/26/2020. FINDINGS: Mediastinal blood pool activity: SUV max 3.0 Liver activity: SUV max 4.6 NECK: 2.7 cm right pharyngeal lesion (series 4/image 20), max SUV 11.2, new. Numerous right cervical nodes, including a 10 mm short axis right level 2 node (series 4/image 29), max SUV 5.6, new. Incidental CT findings: none CHEST: No hypermetabolic thoracic lymphadenopathy. No suspicious pulmonary nodules. Left chest port terminates in the lower SVC. Incidental CT findings: Biapical pleural-parenchymal scarring. Mild centrilobular and paraseptal emphysematous changes, upper lung predominant. Atherosclerotic calcifications of the aortic arch. Three vessel coronary atherosclerosis. ABDOMEN/PELVIS: 1.9  cm metastasis inferiorly in the right hepatic lobe (series 4/image 124), max SUV 11.0, new. Reference splenic hypermetabolism max SUV 3.6, without focal lesion. No abnormal hypermetabolism in the pancreas or adrenal glands. Retroperitoneal/pelvic lymphadenopathy, including: --15 mm short axis node at the aortic bifurcation (series 4/image 152), max SUV 21.5, new --19 mm short axis right common iliac node  (series 4/image 156), max SUV 17.0, new --14 mm short axis left external iliac node (series 4/image 172), max SUV 15.6, new --6.1 x 2.3 cm nodal mass along the left posterior pelvic sidewall (series 4/image 175), similar to prior pelvic MR (July 2021) but progressive from prior PET, max SUV 17.5, previously 10.7 Abnormal soft tissue along the left posterior aspect of the rectum (series 4/image 26), max SUV 15.6, new. Irregular masslike wall thickening involving the bladder (series 4/image 180), raising concern for underlying neoplasm. Bladder is decompressed by indwelling Foley catheter with associated nondependent gas. This is poorly evaluated on PET due to excretory radiotracer. Incidental CT findings: Left renal cysts. Layering bladder calculi. Mild pelvic mesenteric stranding. SKELETON: Tumor involvement of the left iliopsoas musculature, max SUV 19.5, new. Additional involvement of the medial right psoas muscle, max SUV 18.6, new. Multifocal osseous metastases involving the visualized axial and appendicular skeleton, including the bilateral proximal humerii, right acromion and scapula, multiple right ribs, multiple thoracolumbar vertebral bodies, bilateral pelvis, and bilateral proximal femurs. Representative lesions include: --Left T11 vertebral body, max SUV 10.2 --Right L3 vertebral body, max SUV 9.8 --Left iliac crest, max SUV 21.5 Incidental CT findings: Degenerative changes of the visualized thoracolumbar spine. IMPRESSION: Progressive retroperitoneal/pelvic lymphadenopathy, as described above. Associated involvement of the bilateral iliopsoas musculature as well as the left posterolateral rectum, new. New hepatic metastasis. Irregular masslike wall thickening involving the bladder, raising concern for underlying neoplasm. Consider cystoscopy if clinically warranted. Right pharyngeal lesion, new. This presumably reflects lymphoma but is amenable to visual inspection and sampling if there is concern for  primary head/neck cancer. Associated cervical nodal metastases. Multifocal osseous metastases/lymphomatous involvement in the visualized axial and appendicular skeleton. Electronically Signed   By: Julian Hy M.D.   On: 07/19/2020 16:22   DG Knee Complete 4 Views Left  Result Date: 07/14/2020 CLINICAL DATA:  Left knee pain.  Recurrent falls. EXAM: LEFT KNEE - COMPLETE 4+ VIEW COMPARISON:  None. FINDINGS: Mild tricompartmental peripheral spurring. The joint spaces are preserved. Trace joint effusion. There is a 3 cm well-defined osseous projection from the lateral femoral metaphysis directed away from the joint with cortical and medullary continuity consistent with osteochondroma. No fracture, erosion or bony destruction. IMPRESSION: 1. Mild tricompartmental osteoarthritis with trace joint effusion. No acute fracture. 2. Elongated 3 cm osteochondroma from the lateral femur. In the absence of focal pain in this region, no dedicated further imaging is needed. If there is focal pain, consider further evaluation with MRI. Electronically Signed   By: Keith Rake M.D.   On: 07/14/2020 22:27   DG HIP UNILAT WITH PELVIS 2-3 VIEWS LEFT  Result Date: 07/14/2020 CLINICAL DATA:  Left hip pain.  Recurrent falls. EXAM: DG HIP (WITH OR WITHOUT PELVIS) 2-3V LEFT COMPARISON:  None. FINDINGS: Mild osteoarthritis of the left hip with minimal joint space narrowing and femoral head spurring. Mild right hip osteoarthritis also seen. Pubic rami are intact. The sacroiliac joints are congruent. There is no evidence of fracture, avascular necrosis, focal bone lesion or bony destruction. Soft tissues are unremarkable. IMPRESSION: Mild osteoarthritis of the left hip. Electronically Signed   By: Threasa Beards  [place] M.D.   On: 07/14/2020 22:24     ASSESSMENT:  1. Stage IV DLBCL: -6 cycles of R mini CHOP from 08/21/2019 through 01/21/2020. -PET scan on 04/26/2020 showed slight interval enlargement of hypermetabolic nodule in  the deep left pelvis measuring 27 mm, SUV 10.4 increased in size and metabolic activity. 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 -MRI pelvis with6.2 x 4.4 x 3.9 cm soft tissue mass along the posterior left pelvic sidewall and presacral space has increased slightly in size from 05/12/2020. Tumor is extending into S2 foramen causing attenuation and abnormal signal in the S2 root consistent with tumor involvement. Tumor may minimally involve S1 root and S3 to concerning for involvement. -XRT to the left pelvic nodule completed on 06/09/2020, 10 treatments. -PET scan on 07/19/2020 showed progressive retroperitoneal/pelvic lymphadenopathy with involvement of bilateral iliopsoas musculature, new solitary hepatic metastasis.  Multifocal bone lesions present. -MRI of the pelvis on 07/19/2020 also showed multiple lesions in the bilateral bony pelvis and proximal femurs, adenopathy.  2. Pulmonary embolism: -Diagnosed with bilateral pulmonary embolism on 08/26/2019. He is on Eliquis.   PLAN:  1. Stage IV DLBCL: -He is progressively getting weaker.  This morning he was not able to get out of the car. -Review of labs today showed malignant hypercalcemia. -He will proceed with first cycle of Tafasitamab today.  He is waiting for Revlimid to be shipped. -Because of his progressive weakness, I have called the hospitalist service and requested him to be admitted to the hospital. -He will also receive IV hydration for tumor lysis prophylaxis.  He will receive rasburicase today.  His uric acid today was normal with a normal LDH. -He will receive day 4 of treatment on Friday.  2. Pulmonary embolism: -Continue Eliquis at this time.  No bleeding issues.  3.  Lower back and left hip pain: -This is from recurrent lymphoma.  He also has weakness in the left lower extremity from pain. -I have discussed this with Dr. Roderic Palau.  As this is happening over the  last few months and likely from lymphoma, we have decided not doing any MRI of lumbar spine at this time.  4. Urinary problems: -He has chronic indwelling catheter.  5. Macrocytic anemia: -Combination anemia from CKD and lymphoma.  Hemoglobin today is 9.5.  6.  Malignant hypercalcemia: -Calcium is 11.7 with almond 3.1. -He will receive aggressive hydration.  He will also receive zoledronic acid today. -We will closely monitor calcium levels.  Orders placed this encounter:  No orders of the defined types were placed in this encounter.  Total time spent is 40 minutes with more than 50% of the time spent face-to-face discussing treatment plan, counseling and coordination of care.  Derek Jack, MD [place] [phone]   I, Milinda Antis, am acting as a scribe for Dr. Sanda Linger.  I, Derek Jack MD, have reviewed the above documentation for accuracy and completeness, and I agree with the above.

## 2020-07-27 NOTE — Progress Notes (Signed)
Patient was assessed by Dr. Delton Coombes and labs have been reviewed.  Calcium is 11.7, we will give zometa here today.  We will also give normal saline 569ml / hour over 2 hours.  Patient is okay to proceed with treatment today. Primary RN and pharmacy aware.

## 2020-07-27 NOTE — Patient Instructions (Signed)
[facility] Discharge Instructions  Labs drawn from portacath today   Thank you for choosing [facility] at [facility] to provide your oncology and hematology care.  To afford each patient quality time with our provider, please arrive at least 15 minutes before your scheduled appointment time.   If you have a lab appointment with the Cancer Center please come in thru the Main Entrance and check in at the main information desk.  You need to re-schedule your appointment should you arrive 10 or more minutes late.  We strive to give you quality time with our providers, and arriving late affects you and other patients whose appointments are after yours.  Also, if you no show three or more times for appointments you may be dismissed from the clinic at the providers discretion.     Again, thank you for choosing [facility].  Our hope is that these requests will decrease the amount of time that you wait before being seen by our physicians.       _____________________________________________________________  Should you have questions after your visit to [facility], please contact our office at [phone] and follow the prompts.  Our office hours are 8:00 a.m. and 4:30 p.m. Monday - Friday.  Please note that voicemails left after 4:00 p.m. may not be returned until the following business day.  We are closed weekends and major holidays.  You do have access to a nurse 24-7, just call the main number to the clinic [phone] and do not press any options, hold on the line and a nurse will answer the phone.    For prescription refill requests, have your pharmacy contact our office and allow 72 hours.    Due to Covid, you will need to wear a mask upon entering the hospital. If you do not have a mask, a mask will be given to you at the Main Entrance upon arrival. For doctor visits, patients may have 1 support person age 18  or older with them. For treatment visits, patients can not have anyone with them due to social distancing guidelines and our immunocompromised population.     

## 2020-07-27 NOTE — Patient Instructions (Signed)
[place] at [facility] Discharge Instructions  You were seen today by Dr. Delton Coombes. He went over your recent results. You received fluids today. Dr. Delton Coombes will see you back in for labs and follow up.   Thank you for choosing [place] at [facility] to provide your oncology and hematology care.  To afford each patient quality time with our provider, please arrive at least 15 minutes before your scheduled appointment time.   If you have a lab appointment with the [facility] please come in thru the Main Entrance and check in at the main information desk  You need to re-schedule your appointment should you arrive 10 or more minutes late.  We strive to give you quality time with our providers, and arriving late affects you and other patients whose appointments are after yours.  Also, if you no show three or more times for appointments you may be dismissed from the clinic at the providers discretion.     Again, thank you for choosing [facility].  Our hope is that these requests will decrease the amount of time that you wait before being seen by our physicians.       _____________________________________________________________  Should you have questions after your visit to [facility], please contact our office at [phone] between the hours of 8:00 a.m. and 4:30 p.m.  Voicemails left after 4:00 p.m. will not be returned until the following business day.  For prescription refill requests, have your pharmacy contact our office and allow 72 hours.    Cancer Center Support Programs:   > Cancer Support Group  2nd Tuesday of the month 1pm-2pm, Journey Room

## 2020-07-27 NOTE — Progress Notes (Signed)
Patient to room 404 for treatment.  Consent signed with information given for review and for home about therapy.  All questions asked and answered.  No s/s of distress noted.   Report called to Loni Muse, RN.  Reviewed patients history, medications with treatment plan, and vital signs.  Patient to go to ICU 8.    Message left on the daughters cell phone regarding patients room number.   Patient tolerated chemotherapy with no complaints voiced.  Side effects with management reviewed with understanding verbalized.  Port site clean and dry with no bruising or swelling noted at site.  Good blood return noted before and after administration of chemotherapy.  Dressing reinforced.  Patient transferred to ICU in  satisfactory condition with VSS and no s/s of distress noted.

## 2020-07-27 NOTE — H&P (Addendum)
History and Physical    Daniel Richards IEP:[id number] DOB: July 06, 1946 DOA: 07/27/2020  PCP: Daniel Sender, FNP  Patient coming from: Home  I have personally briefly reviewed patient's old medical records in [place]  Chief Complaint: Left leg pain, fall, generalized weakness  HPI: Daniel Richards is a 74 y.o. male with medical history significant of lymphoma who is undergoing chemotherapy, presented to the cancer center today for follow-up visit.  It was noted that patient was increasingly weak, had been falling recently, had increasing pain in his left thigh.  He denies any fever, shortness of breath.  He has a chronic unchanged cough.  No vomiting or diarrhea.  He is fully vaccinated for COVID-19.  He was evaluated the cancer center where basic labs were drawn and he was found to be significantly hypercalcemic.  He received IV fluids and zoledronic acid.  His family felt that they could not manage him at home in his current state.  Admission was requested for further management.  Review of Systems:  Review of Systems  Constitutional: Negative for chills and fever.  HENT: Negative for congestion and sore throat.   Eyes: Negative for blurred vision and double vision.  Respiratory: Positive for cough. Negative for sputum production and shortness of breath.   Cardiovascular: Negative for chest pain and palpitations.  Gastrointestinal: Negative for abdominal pain, diarrhea and vomiting.  Genitourinary: Negative for dysuria.  Musculoskeletal: Positive for falls and joint pain.  Skin: Negative for rash.  Neurological: Positive for tremors and weakness. Negative for loss of consciousness.      Past Medical History:  Diagnosis Date  . Acid reflux   . Anxiety and depression   . Cancer ([place])    lymphoma  . Depression   . Diabetes ([place])   . Diabetes mellitus 06/09/2011   pt taking metformin  . DJD of shoulder    right   . Hyperlipidemia   . Hypertension   . Lymphoma  ([place])   . Migraines   . Port-A-Cath in place 08/13/2019  . Pulmonary embolism ([place])   . Stomach ulcer   . Tremor   . Tremor   . Vitamin D deficiency     Past Surgical History:  Procedure Laterality Date  . BACK SURGERY    . KNEE SURGERY    . PORTACATH PLACEMENT Left 08/08/2019   Procedure: INSERTION PORT-A-CATH;  Surgeon: Aviva Signs, MD;  Location: [facility];  Service: General;  Laterality: Left;  . SHOULDER SURGERY      Social History:  reports that he quit smoking about 30 years ago. His smoking use included cigarettes. He has a 40.00 pack-year smoking history. He has never used smokeless tobacco. He reports that he does not drink alcohol and does not use drugs.  Allergies  Allergen Reactions  . Vancomycin Hives    Patient received Benadryl to treat the hives    Family History  Problem Relation Age of Onset  . Cancer Sister   . Cancer Sister   . Cancer Brother   . Heart attack Father   . Cancer Brother   . Heart attack Brother   . Healthy Son   . Healthy Daughter   . Migraines Neg Hx      Prior to Admission medications   Medication Sig Start Date End Date Taking? Authorizing Provider  allopurinol (ZYLOPRIM) 300 MG tablet Take 1 tablet (300 mg total) by mouth daily. 09/15/19   Derek Jack, MD  citalopram (CELEXA) 20  MG tablet Take 1 tablet (20 mg total) by mouth at bedtime. Patient taking differently: Take 40 mg by mouth at bedtime.  08/06/19   Gerlene Fee, NP  cyclobenzaprine (FLEXERIL) 10 MG tablet Take 10 mg by mouth at bedtime. 03/23/20   [provider]  ELIQUIS 5 MG TABS tablet Take 5 mg by mouth 2 (two) times daily. 12/23/19   [provider]  FARXIGA 10 MG TABS tablet Take 10 mg by mouth daily. 07/23/20   [provider]  gabapentin (NEURONTIN) 300 MG capsule Take 2 capsules (600 mg total) by mouth 3 (three) times daily. 08/06/19   Gerlene Fee, NP  HYDROcodone-acetaminophen (NORCO) 10-325 MG tablet Take 1 tablet by  mouth every 6 (six) hours as needed for moderate pain or severe pain.  12/08/19   [provider]  lenalidomide (REVLIMID) 10 MG capsule Take 1 capsule (10 mg total) by mouth daily. [employer] Auth # [id number]     Date Obtained 07/22/2020 07/22/20   Derek Jack, MD  metFORMIN (GLUCOPHAGE) 500 MG tablet Take 1 tablet (500 mg total) by mouth daily. 08/06/19   Gerlene Fee, NP  pantoprazole (PROTONIX) 40 MG tablet Take 1 tablet (40 mg total) by mouth 2 (two) times daily before a meal. 08/06/19   Nyoka Cowden, Phylis Bougie, NP  primidone (MYSOLINE) 50 MG tablet Take 1.5 tablets (75 mg total) by mouth every 8 (eight) hours. Patient taking differently: Take 100 mg by mouth 2 (two) times daily.  08/06/19   Gerlene Fee, NP  prochlorperazine (COMPAZINE) 10 MG tablet Take 1 tablet (10 mg total) by mouth every 6 (six) hours as needed (Nausea or vomiting). 07/27/20   Derek Jack, MD  propranolol (INDERAL) 40 MG tablet Take 1 tablet (40 mg total) by mouth 2 (two) times daily. 08/06/19   Gerlene Fee, NP  tafasitamab-cxix 12 mg/kg in sodium chloride 0.9 % Inject 12 mg/kg into the vein See admin instructions. Cycle 1 - D1, 4, 8, 15, 22; Cycles 2-3 - D1, 8, 15, 22; Cycle 4 and beyond - D1, D15 q 28 days 07/27/20   [provider]  temazepam (RESTORIL) 15 MG capsule Take 1 capsule (15 mg total) by mouth at bedtime. 10/07/19   Barton Dubois, MD  topiramate (TOPAMAX) 100 MG tablet Take 2 tablets (200 mg total) by mouth at bedtime. 08/06/19   Gerlene Fee, NP    Physical Exam: Vitals:   07/27/20 1600 07/27/20 1700 07/27/20 1711 07/27/20 1800  BP: (!) 104/58 111/68  (!) 115/57  Pulse: 87 85  89  Resp: 11 20  13   Temp:   97.9 F (36.6 C)   TempSrc:   Oral   SpO2: 97% 97%  96%  Weight:      Height:        Constitutional: NAD, calm, comfortable Eyes: PERRL, lids and conjunctivae normal ENMT: Mucous membranes are moist. Posterior pharynx clear of any exudate or lesions.Normal  dentition.  Neck: normal, supple, no masses, no thyromegaly Respiratory: clear to auscultation bilaterally, no wheezing, no crackles. Normal respiratory effort. No accessory muscle use.  Cardiovascular: Regular rate and rhythm, no murmurs / rubs / gallops. No extremity edema. 2+ pedal pulses. No carotid bruits.  Abdomen: no tenderness, no masses palpated. No hepatosplenomegaly. Bowel sounds positive.  Musculoskeletal: Tenderness to palpation over left hip, left thigh, no erythema Skin: no rashes, lesions, ulcers. No induration Neurologic: CN 2-12 grossly intact. Sensation intact, DTR normal. Strength 5/5 in all 4.  Psychiatric: Normal judgment and insight. Alert and oriented x 3. Normal mood.    Labs on Admission: I have personally reviewed following labs and imaging studies  CBC: Recent Labs  Lab 07/22/20 0951 07/27/20 0841  WBC 6.5 5.8  NEUTROABS 4.6 3.9  HGB 9.1* 9.5*  HCT 30.3* 30.3*  MCV 105.6* 103.4*  PLT 379 595   Basic Metabolic Panel: Recent Labs  Lab 07/22/20 0951 07/27/20 0841  NA 133* 136  K 4.3 4.6  CL 100 101  CO2 23 26  GLUCOSE 154* 140*  BUN 24* 29*  CREATININE 1.94* 1.74*  CALCIUM 9.8 11.7*  MG 1.9 2.0   GFR: Estimated Creatinine Clearance: 39.6 mL/min (A) (by C-G formula based on SCr of 1.74 mg/dL (H)). Liver Function Tests: Recent Labs  Lab 07/22/20 0951 07/27/20 0841  AST 15 16  ALT 14 14  ALKPHOS 97 111  BILITOT 0.4 0.4  PROT 7.5 7.6  ALBUMIN 3.3* 3.1*   No results for input(s): LIPASE, AMYLASE in the last 168 hours. No results for input(s): AMMONIA in the last 168 hours. Coagulation Profile: No results for input(s): INR, PROTIME in the last 168 hours. Cardiac Enzymes: No results for input(s): CKTOTAL, CKMB, CKMBINDEX, TROPONINI in the last 168 hours. BNP (last 3 results) No results for input(s): PROBNP in the last 8760 hours. HbA1C: No results for input(s): HGBA1C in the last 72 hours. CBG: No results for input(s): GLUCAP in the  last 168 hours. Lipid Profile: No results for input(s): CHOL, HDL, LDLCALC, TRIG, CHOLHDL, LDLDIRECT in the last 72 hours. Thyroid Function Tests: No results for input(s): TSH, T4TOTAL, FREET4, T3FREE, THYROIDAB in the last 72 hours. Anemia Panel: No results for input(s): VITAMINB12, FOLATE, FERRITIN, TIBC, IRON, RETICCTPCT in the last 72 hours. Urine analysis:    Component Value Date/Time   COLORURINE YELLOW 01/15/2020 1741   APPEARANCEUR CLOUDY (A) 01/15/2020 1741   LABSPEC 1.011 01/15/2020 1741   PHURINE 5.0 01/15/2020 1741   GLUCOSEU NEGATIVE 01/15/2020 1741   HGBUR SMALL (A) 01/15/2020 1741   BILIRUBINUR NEGATIVE 01/15/2020 1741   KETONESUR NEGATIVE 01/15/2020 1741   PROTEINUR 100 (A) 01/15/2020 1741   UROBILINOGEN 1.0 11/18/2014 1238   NITRITE POSITIVE (A) 01/15/2020 1741   LEUKOCYTESUR LARGE (A) 01/15/2020 1741    Radiological Exams on Admission: No results found.    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 -When corrected for albumin, calcium is 12.4 -He received zoledronic acid at the cancer center -Continue on IV hydration  B-cell lymphoma -Currently on treatment per oncology  Left leg pain. -Suspect this is related to underlying metastatic deposits -He recently had plain films done that did not show any fractures -Continue pain management  Falls -Patient reports having multiple falls recently. -We will request physical therapy input to see if he is a candidate for rehab  Anemia of chronic  disease -Hemoglobin is currently at baseline  Chronic kidney disease stage IIIb -Creatinine is currently at baseline, continue to monitor  Diabetes. -Chronically on Metformin and Farxiga -Hold oral agents for  now start on sliding scale  History of pulmonary embolism -Continue on apixaban  Essential tremor -Continue on primidone  Chronic indwelling Foley catheter -Urinalysis indicates pyuria -Unclear if this represents true infection -Patient is not febrile and does not have any leukocytosis -Urine culture sent -We will hold on antibiotics for now unless patient becomes symptomatic  DVT prophylaxis: Apixaban Code Status: DNR, confirmed with patient Family Communication: Updated patient's daughter over the phone Disposition Plan: Possible skilled nursing facility placement Consults called: Oncology Admission status: Observation, MedSurg  Kathie Dike MD [facility]   If 7PM-7AM, please contact night-coverage [email or web address]   07/27/2020, 7:07 PM

## 2020-07-28 ENCOUNTER — Telehealth (HOSPITAL_COMMUNITY): Payer: Self-pay

## 2020-07-28 ENCOUNTER — Encounter (HOSPITAL_COMMUNITY): Payer: Self-pay | Admitting: Internal Medicine

## 2020-07-28 DIAGNOSIS — Y92009 Unspecified place in unspecified non-institutional (private) residence as the place of occurrence of the external cause: Secondary | ICD-10-CM | POA: Diagnosis not present

## 2020-07-28 DIAGNOSIS — N401 Enlarged prostate with lower urinary tract symptoms: Secondary | ICD-10-CM | POA: Diagnosis present

## 2020-07-28 DIAGNOSIS — Z7189 Other specified counseling: Secondary | ICD-10-CM

## 2020-07-28 DIAGNOSIS — E1122 Type 2 diabetes mellitus with diabetic chronic kidney disease: Secondary | ICD-10-CM | POA: Diagnosis present

## 2020-07-28 DIAGNOSIS — Z66 Do not resuscitate: Secondary | ICD-10-CM | POA: Diagnosis present

## 2020-07-28 DIAGNOSIS — W19XXXA Unspecified fall, initial encounter: Secondary | ICD-10-CM | POA: Diagnosis present

## 2020-07-28 DIAGNOSIS — N1832 Chronic kidney disease, stage 3b: Secondary | ICD-10-CM | POA: Diagnosis present

## 2020-07-28 DIAGNOSIS — C787 Secondary malignant neoplasm of liver and intrahepatic bile duct: Secondary | ICD-10-CM | POA: Diagnosis present

## 2020-07-28 DIAGNOSIS — R531 Weakness: Secondary | ICD-10-CM

## 2020-07-28 DIAGNOSIS — R627 Adult failure to thrive: Secondary | ICD-10-CM | POA: Diagnosis present

## 2020-07-28 DIAGNOSIS — Z86711 Personal history of pulmonary embolism: Secondary | ICD-10-CM | POA: Diagnosis not present

## 2020-07-28 DIAGNOSIS — M19011 Primary osteoarthritis, right shoulder: Secondary | ICD-10-CM | POA: Diagnosis present

## 2020-07-28 DIAGNOSIS — Z515 Encounter for palliative care: Secondary | ICD-10-CM | POA: Diagnosis not present

## 2020-07-28 DIAGNOSIS — R338 Other retention of urine: Secondary | ICD-10-CM | POA: Diagnosis present

## 2020-07-28 DIAGNOSIS — F419 Anxiety disorder, unspecified: Secondary | ICD-10-CM | POA: Diagnosis present

## 2020-07-28 DIAGNOSIS — M79605 Pain in left leg: Secondary | ICD-10-CM | POA: Diagnosis present

## 2020-07-28 DIAGNOSIS — Z6823 Body mass index (BMI) 23.0-23.9, adult: Secondary | ICD-10-CM | POA: Diagnosis not present

## 2020-07-28 DIAGNOSIS — Z978 Presence of other specified devices: Secondary | ICD-10-CM | POA: Diagnosis not present

## 2020-07-28 DIAGNOSIS — D638 Anemia in other chronic diseases classified elsewhere: Secondary | ICD-10-CM | POA: Diagnosis not present

## 2020-07-28 DIAGNOSIS — Z960 Presence of urogenital implants: Secondary | ICD-10-CM | POA: Diagnosis present

## 2020-07-28 DIAGNOSIS — F329 Major depressive disorder, single episode, unspecified: Secondary | ICD-10-CM | POA: Diagnosis present

## 2020-07-28 DIAGNOSIS — Z881 Allergy status to other antibiotic agents status: Secondary | ICD-10-CM | POA: Diagnosis not present

## 2020-07-28 DIAGNOSIS — R296 Repeated falls: Secondary | ICD-10-CM | POA: Diagnosis present

## 2020-07-28 DIAGNOSIS — Z9221 Personal history of antineoplastic chemotherapy: Secondary | ICD-10-CM | POA: Diagnosis not present

## 2020-07-28 DIAGNOSIS — E1142 Type 2 diabetes mellitus with diabetic polyneuropathy: Secondary | ICD-10-CM | POA: Diagnosis not present

## 2020-07-28 DIAGNOSIS — C8510 Unspecified B-cell lymphoma, unspecified site: Secondary | ICD-10-CM | POA: Diagnosis not present

## 2020-07-28 DIAGNOSIS — Z7901 Long term (current) use of anticoagulants: Secondary | ICD-10-CM | POA: Diagnosis not present

## 2020-07-28 DIAGNOSIS — Z20822 Contact with and (suspected) exposure to covid-19: Secondary | ICD-10-CM | POA: Diagnosis present

## 2020-07-28 DIAGNOSIS — Z23 Encounter for immunization: Secondary | ICD-10-CM | POA: Diagnosis not present

## 2020-07-28 DIAGNOSIS — C8333 Diffuse large B-cell lymphoma, intra-abdominal lymph nodes: Secondary | ICD-10-CM | POA: Diagnosis present

## 2020-07-28 DIAGNOSIS — G250 Essential tremor: Secondary | ICD-10-CM | POA: Diagnosis present

## 2020-07-28 LAB — GLUCOSE, CAPILLARY
Glucose-Capillary: 100 mg/dL — ABNORMAL HIGH (ref 70–99)
Glucose-Capillary: 126 mg/dL — ABNORMAL HIGH (ref 70–99)
Glucose-Capillary: 129 mg/dL — ABNORMAL HIGH (ref 70–99)
Glucose-Capillary: 93 mg/dL (ref 70–99)

## 2020-07-28 LAB — CBC
HCT: 26.9 % — ABNORMAL LOW (ref 39.0–52.0)
Hemoglobin: 8.2 g/dL — ABNORMAL LOW (ref 13.0–17.0)
MCH: 31.4 pg (ref 26.0–34.0)
MCHC: 30.5 g/dL (ref 30.0–36.0)
MCV: 103.1 fL — ABNORMAL HIGH (ref 80.0–100.0)
Platelets: 342 10*3/uL (ref 150–400)
RBC: 2.61 MIL/uL — ABNORMAL LOW (ref 4.22–5.81)
RDW: 13.7 % (ref 11.5–15.5)
WBC: 4.5 10*3/uL (ref 4.0–10.5)
nRBC: 0 % (ref 0.0–0.2)

## 2020-07-28 LAB — URINALYSIS, COMPLETE (UACMP) WITH MICROSCOPIC
Bilirubin Urine: NEGATIVE
Glucose, UA: >=500 mg/dL — AB
Hgb urine dipstick: NEGATIVE
Ketones, ur: NEGATIVE mg/dL
Nitrite: NEGATIVE
Protein, ur: 30 mg/dL — AB
Specific Gravity, Urine: 1.016 (ref 1.005–1.030)
pH: 7 (ref 5.0–8.0)

## 2020-07-28 LAB — T4, FREE: Free T4: 0.85 ng/dL (ref 0.61–1.12)

## 2020-07-28 LAB — COMPREHENSIVE METABOLIC PANEL
ALT: 13 U/L (ref 0–44)
AST: 12 U/L — ABNORMAL LOW (ref 15–41)
Albumin: 2.9 g/dL — ABNORMAL LOW (ref 3.5–5.0)
Alkaline Phosphatase: 93 U/L (ref 38–126)
Anion gap: 9 (ref 5–15)
BUN: 34 mg/dL — ABNORMAL HIGH (ref 8–23)
CO2: 25 mmol/L (ref 22–32)
Calcium: 11.2 mg/dL — ABNORMAL HIGH (ref 8.9–10.3)
Chloride: 103 mmol/L (ref 98–111)
Creatinine, Ser: 1.83 mg/dL — ABNORMAL HIGH (ref 0.61–1.24)
GFR calc Af Amer: 41 mL/min — ABNORMAL LOW (ref >60)
GFR calc non Af Amer: 35 mL/min — ABNORMAL LOW (ref >60)
Glucose, Bld: 94 mg/dL (ref 70–99)
Potassium: 4.5 mmol/L (ref 3.5–5.1)
Sodium: 137 mmol/L (ref 135–145)
Total Bilirubin: 0.3 mg/dL (ref 0.3–1.2)
Total Protein: 6.6 g/dL (ref 6.5–8.1)

## 2020-07-28 LAB — TSH: TSH: 10.955 u[IU]/mL — ABNORMAL HIGH (ref 0.350–4.500)

## 2020-07-28 LAB — FOLATE: Folate: 14.7 ng/mL (ref >5.9)

## 2020-07-28 LAB — VITAMIN B12: Vitamin B-12: 173 pg/mL — ABNORMAL LOW (ref 180–914)

## 2020-07-28 LAB — HEMOGLOBIN A1C
Hgb A1c MFr Bld: 6.3 % — ABNORMAL HIGH (ref 4.8–5.6)
Mean Plasma Glucose: 134.11 mg/dL

## 2020-07-28 MED ORDER — SODIUM CHLORIDE 0.9 % IV SOLN: INTRAVENOUS | Status: DC

## 2020-07-28 MED ORDER — INFLUENZA VAC A&B SA ADJ QUAD 0.5 ML IM PRSY: 0.5000 mL | PREFILLED_SYRINGE | INTRAMUSCULAR | Status: DC

## 2020-07-28 NOTE — TOC Initial Note (Signed)
Transition of Care [facility]) - Initial/Assessment Note    Patient Details  Name: Daniel Richards MRN: [id number] Date of Birth: Mar 01, 1946  [facility] CM/SW Contact:    Iona Beard, [place] Phone Number: 07/28/2020, 2:44 PM  Clinical Narrative:                 Pt admitted for Malignant hypercalcemia. MD had goals of care conversation with family, they have decided to pursue [facility] CSW confirmed with daughter [name] about referral. CSW made referral to Hospice. No beds currently available. TOC to follow.     Barriers to Discharge: Continued Medical Work up   Patient Goals and CMS Choice        Expected Discharge Plan and Services                                                Prior Living Arrangements/Services                       Activities of Daily Living Home Assistive Devices/Equipment: Environmental consultant (specify type), Wheelchair, Radio producer (specify quad or straight) ADL Screening (condition at time of admission) Patient's cognitive ability adequate to safely complete daily activities?: Yes Is the patient deaf or have difficulty hearing?: No Does the patient have difficulty seeing, even when wearing glasses/contacts?: No Does the patient have difficulty concentrating, remembering, or making decisions?: No Patient able to express need for assistance with ADLs?: Yes Does the patient have difficulty dressing or bathing?: No Independently performs ADLs?: Yes (appropriate for developmental age) Does the patient have difficulty walking or climbing stairs?: Yes Weakness of Legs: Both Weakness of Arms/Hands: None  Permission Sought/Granted                  Emotional Assessment              Admission diagnosis:  Hypercalcemia [E83.52] Patient Active Problem List   Diagnosis Date Noted  . Chronic indwelling Foley catheter 07/27/2020  . Fall at home, initial encounter 07/27/2020  . Left leg pain 07/27/2020  . Chronic low  back pain 07/27/2020  . Other specified types of non-hodgkin lymphoma, intrapelvic lymph nodes ([place]) 05/14/2020  . Incomplete emptying of bladder 01/22/2020  . Sepsis ([place]) 01/15/2020  . Hypokalemia   . Acute cystitis without hematuria   . Epiploic appendagitis 09/27/2019  . Acute saddle pulmonary embolism with acute cor pulmonale (HCC)   . Urinary tract infection associated with indwelling urethral catheter ([place])   . Febrile neutropenia ([place]) 08/26/2019  . PE (pulmonary thromboembolism) ([place]) 08/26/2019  . Neutropenia, drug-induced ([place]) 08/20/2019  . 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 Port-A-Cath in place 08/13/2019  . Anemia of chronic disease 08/07/2019  . Major depression, recurrent, chronic ([place]) 08/07/2019  . BPH without urinary obstruction 08/07/2019  . Aortic atherosclerosis ([place]) 08/07/2019  . Diabetic peripheral neuropathy ([place]) 08/07/2019  . Hypertension associated with type 2 diabetes mellitus ([place]) 08/07/2019  . 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 Odynophagia   . Acute kidney injury ([place])   . Falls frequently   . Generalized abdominal pain   . Goals of care, counseling/discussion   . Palliative care by specialist   . Folate deficiency   . Generalized weakness 07/25/2019  . Intra-abdominal  lymphadenopathy 07/25/2019  . Retroperitoneal lymphadenopathy 07/25/2019  . Malnutrition of moderate degree 07/25/2019  . Tremor   . Hypercalcemia 07/24/2019  . B12 deficiency 11/18/2016  . Weakness generalized 11/16/2016  . Altered mental status, unspecified 11/16/2016  . Fracture, rib 11/16/2016  . Weakness 11/16/2016  . New onset of headaches after age 72 09/01/2015  . Morning headache 09/01/2015  . Daytime somnolence 09/01/2015  . Essential tremor 09/01/2015  . Intractable headache 09/01/2015  . Type 2 diabetes, controlled, with peripheral neuropathy ([place]) 06/09/2011   PCP:   [name], FNP Pharmacy:   [facility], [place] [address] Phone: [phone] Fax: [phone]  [facility] - [place], [place] - [address] [place] [address] Phone: [phone] Fax: [place], [place] [address] Phone: [phone] Fax: [place], [place] - [place] [address] [place] [address] [place] [address] Phone: [phone] Fax: [phone]     Social Determinants of Health ([place]) Interventions    Readmission Risk Interventions Readmission Risk Prevention Plan 01/18/2020 09/30/2019 07/30/2019  Post Dischage Appt - - Not Complete  Appt Comments - [facility] MD will follow while pt at short term rehab  Medication Screening - - -  Transportation Screening Complete Complete -  Medication Review Press photographer) Complete Complete -  PCP or Specialist appointment within 3-5 days of discharge - Not Complete -  [place] or Home Care Consult Complete Complete -  SW Recovery Care/Counseling Consult Complete Complete -  Palliative Care Screening Not Applicable Not Complete -  [place] Not Applicable Not Complete -  Some recent data might be hidden

## 2020-07-28 NOTE — Care Management Obs Status (Signed)
[place] NOTIFICATION   Patient Details  Name: Daniel Richards MRN: [id number] Date of Birth: 02-Sep-1946   Medicare Observation Status Notification Given:  Yes    [name] 07/28/2020, 11:32 AM

## 2020-07-28 NOTE — Plan of Care (Signed)
°  Problem: Acute Rehab PT Goals(only PT should resolve) Goal: Pt Will Go Supine/Side To Sit Outcome: Progressing Flowsheets (Taken 07/28/2020 1223) Pt will go Supine/Side to Sit:  with minimal assist  with moderate assist Goal: Patient Will Transfer Sit To/From Stand Outcome: Progressing Flowsheets (Taken 07/28/2020 1223) Patient will transfer sit to/from stand:  with minimal assist  with moderate assist Goal: Pt Will Transfer Bed To Chair/Chair To Bed Outcome: Progressing Flowsheets (Taken 07/28/2020 1223) Pt will Transfer Bed to Chair/Chair to Bed: with mod assist Goal: Pt Will Ambulate Outcome: Progressing Flowsheets (Taken 07/28/2020 1223) Pt will Ambulate:  10 feet  with moderate assist  with rolling walker   12:24 PM, 07/28/20 Lonell Grandchild, MPT Physical Therapist with [facility] [phone] office [phone] mobile phone

## 2020-07-28 NOTE — Progress Notes (Signed)
PROGRESS NOTE  Daniel Richards QIO:[id number] DOB: 1946-04-18 DOA: 07/27/2020 PCP: Daniel Sender, FNP  Brief History:  74 y.o. male with medical history significant of lymphoma who is undergoing chemotherapy, presented to the cancer center 07/27/20 for follow-up visit.   It was noted that patient was increasingly weak, had been falling recently, had increasing pain in his left thigh.  He denies any fever, shortness of breath.  He has a chronic unchanged cough.  No vomiting or diarrhea.  He is fully vaccinated for COVID-19.  He was evaluated the cancer center where basic labs were drawn and he was found to be significantly hypercalcemic.  He received IV fluids and zoledronic acid.  His family felt that they could not manage him at home in his current state.  Admission was requested for further management.  Assessment/Plan: Hypercalcemia of malignancy -Presented with serum corrected calcium 12.4 -The patient received zoledronic acid at the cancer center prior to his admission -Continue IV fluids -07/28/2020 corrected calcium 12.08 -Changed to normal saline  B-cell lymphoma -Follows with Dr. Delton Coombes at [facility] -6 cycles of R mini CHOP from 08/21/2019 through 01/21/2020. -PET scan on 04/26/2020 showed slight interval enlargement of hypermetabolic nodule in the deep left pelvis measuring 27 mm, SUV 10.4 increased in size and metabolic activity. 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 -PET scan on 07/19/2020 showed progressive retroperitoneal/pelvic lymphadenopathy with involvement of bilateral iliopsoas musculature, new solitary hepatic metastasis. Multifocal bone lesions present. -MRI of the pelvis on 07/19/2020 also showed multiple lesions in the bilateral bony pelvis and proximal femurs, adenopathy.  Failure to thrive -TSH -Serum L24 -Folic acid -TSH -The patient has been getting progressively  weaker.  He was not able to get out of his car on the morning of his cancer center appointment -UA and urine culture  Low back and left hip pain/uncontrolled pain -Dr. Delton Coombes discussed with Dr. Tomasa Hosteller this has been happening for the last few months, this is likely from the patient's lymphoma and MRI of the lumbar spine was deferred -Continue IV opioids and gabapentin  Urinary retention -Patient has chronic indwelling Foley catheter  History of PE -Diagnosed with bilateral PEs 08/26/2019 -Continue apixaban  Diabetes mellitus type 2, controlled -07/27/2020 hemoglobin A1c 6.3 -Allow for liberal glycemic control at this point -Discontinue insulin sliding scale  Essential tremor -Continue Mysoline  Depression/anxiety  -continue Celexa  Goals of Care -I had long discussion with daughter-->wants to focus on his comfort and agrees with residential hospice referral Advance care planning, including the explanation and discussion of advance directives was carried out with the patient and family.  Code status including explanations of "Full Code" and "DNR" and alternatives were discussed in detail.  Discussion of end-of-life issues including but not limited palliative care, hospice care and the concept of hospice, other end-of-life care options, power of attorney for health care decisions, living wills, and physician orders for life-sustaining treatment were also discussed with the patient and family.  Total face to face time 16 minutes. -she does want to continue current therapy with the understanding that after transition to residential hospice, all meds with curative intent will be stopped         Status is: Observation  The patient will require care spanning > 2 midnights and should be moved to inpatient because: Unsafe d/c plan and IV treatments appropriate due to intensity of illness  or inability to take PO  Has uncontrolled pain, requiring IV analgesia  Dispo: The patient  is from: Home              Anticipated d/c is to: Residential Hospice              Anticipated d/c date is: 1 day              Patient currently is not medically stable to d/c.        Family Communication:   Updated daughter 9/22  Consultants:   Palliative medicine  Code Status:  DNR  DVT Prophylaxis: FULL COMFORT   Procedures: As Listed in Progress Note Above  Antibiotics: None    Total time spent 35 minutes.  Greater than 50% spent face to face counseling and coordinating care.   Subjective: Patient complains of left leg pain.  Denies f/c, cp, sob, n/v/d, abd pain  Objective: Vitals:   07/28/20 0700 07/28/20 0800 07/28/20 0900 07/28/20 1000  BP: (!) 127/53 (!) 124/51 (!) 153/59 (!) 98/50  Pulse: 74 72 88 61  Resp: 13 15 17 13   Temp:  97.9 F (36.6 C)    TempSrc:  Oral    SpO2: 97% 97% 93% 96%  Weight:      Height:        Intake/Output Summary (Last 24 hours) at 07/28/2020 1150 Last data filed at 07/28/2020 0600 Gross per 24 hour  Intake 1361.63 ml  Output 950 ml  Net 411.63 ml   Weight change:  Exam:   General:  Pt is alert, follows commands appropriately, not in acute distress  HEENT: No icterus, No thrush, No neck mass, [place]/AT  Cardiovascular: IRRR, S1/S2, no rubs, no gallops  Respiratory: bibasilar rales. No wheeze  Abdomen: Soft/+BS, non tender, non distended, no guarding  Extremities: No edema, No lymphangitis, No petechiae, No rashes, no synovitis   Data Reviewed: I have personally reviewed following labs and imaging studies Basic Metabolic Panel: Recent Labs  Lab 07/22/20 0951 07/27/20 0841 07/28/20 0339  NA 133* 136 137  K 4.3 4.6 4.5  CL 100 101 103  CO2 23 26 25   GLUCOSE 154* 140* 94  BUN 24* 29* 34*  CREATININE 1.94* 1.74* 1.83*  CALCIUM 9.8 11.7* 11.2*  MG 1.9 2.0  --    Liver Function Tests: Recent Labs  Lab 07/22/20 0951 07/27/20 0841 07/28/20 0339  AST 15 16 12*  ALT 14 14 13   ALKPHOS 97 111 93  BILITOT  0.4 0.4 0.3  PROT 7.5 7.6 6.6  ALBUMIN 3.3* 3.1* 2.9*   No results for input(s): LIPASE, AMYLASE in the last 168 hours. No results for input(s): AMMONIA in the last 168 hours. Coagulation Profile: No results for input(s): INR, PROTIME in the last 168 hours. CBC: Recent Labs  Lab 07/22/20 0951 07/27/20 0841 07/28/20 0339  WBC 6.5 5.8 4.5  NEUTROABS 4.6 3.9  --   HGB 9.1* 9.5* 8.2*  HCT 30.3* 30.3* 26.9*  MCV 105.6* 103.4* 103.1*  PLT 379 374 342   Cardiac Enzymes: No results for input(s): CKTOTAL, CKMB, CKMBINDEX, TROPONINI in the last 168 hours. BNP: Invalid input(s): POCBNP CBG: Recent Labs  Lab 07/27/20 2226 07/28/20 0747 07/28/20 1129  GLUCAP 123* 100* 126*   HbA1C: Recent Labs    07/27/20 0841  HGBA1C 6.3*   Urine analysis:    Component Value Date/Time   COLORURINE YELLOW 01/15/2020 1741   APPEARANCEUR CLOUDY (A) 01/15/2020 1741   LABSPEC 1.011 01/15/2020  1741   PHURINE 5.0 01/15/2020 1741   GLUCOSEU NEGATIVE 01/15/2020 1741   HGBUR SMALL (A) 01/15/2020 1741   BILIRUBINUR NEGATIVE 01/15/2020 1741   KETONESUR NEGATIVE 01/15/2020 1741   PROTEINUR 100 (A) 01/15/2020 1741   UROBILINOGEN 1.0 11/18/2014 1238   NITRITE POSITIVE (A) 01/15/2020 1741   LEUKOCYTESUR LARGE (A) 01/15/2020 1741   Sepsis Labs: @LABRCNTIP (procalcitonin:4,lacticidven:4) ) Recent Results (from the past 240 hour(s))  SARS Coronavirus 2 by RT PCR (hospital order, performed in [place] hospital lab)     Status: None   Collection Time: 07/27/20  3:24 PM  Result Value Ref Range Status   SARS Coronavirus 2 NEGATIVE NEGATIVE Final    Comment: (NOTE) SARS-CoV-2 target nucleic acids are NOT DETECTED.  The SARS-CoV-2 RNA is generally detectable in upper and lower respiratory specimens during the acute phase of infection. The lowest concentration of SARS-CoV-2 viral copies this assay can detect is 250 copies / mL. A negative result does not preclude SARS-CoV-2 infection and should not  be used as the sole basis for treatment or other patient management decisions.  A negative result may occur with improper specimen collection / handling, submission of specimen other than nasopharyngeal swab, presence of viral mutation(s) within the areas targeted by this assay, and inadequate number of viral copies (<250 copies / mL). A negative result must be combined with clinical observations, patient history, and epidemiological information.  Fact Sheet for Patients:   StrictlyIdeas.no  Fact Sheet for Healthcare Providers: BankingDealers.co.za  This test is not yet approved or  cleared by the [place] FDA and has been authorized for detection and/or diagnosis of SARS-CoV-2 by FDA under an Emergency Use Authorization (EUA).  This EUA will remain in effect (meaning this test can be used) for the duration of the COVID-19 declaration under Section 564(b)(1) of the Act, 21 U.S.C. section 360bbb-3(b)(1), unless the authorization is terminated or revoked sooner.  Performed at [facility], [address], [place], [place] [address]   MRSA PCR Screening     Status: None   Collection Time: 07/27/20  3:49 PM   Specimen: Nasopharyngeal Swab  Result Value Ref Range Status   MRSA by PCR NEGATIVE NEGATIVE Final    Comment:        The GeneXpert MRSA Assay (FDA approved for NASAL specimens only), is one component of a comprehensive MRSA colonization surveillance program. It is not intended to diagnose MRSA infection nor to guide or monitor treatment for MRSA infections. Performed at [facility], [address], [place],  [address]      Scheduled Meds: . allopurinol  300 mg Oral Daily  . apixaban  5 mg Oral BID  . citalopram  40 mg Oral QHS  . cyclobenzaprine  10 mg Oral QHS  . gabapentin  600 mg Oral TID  . insulin aspart  0-15 Units Subcutaneous TID WC  . insulin aspart  0-5 Units Subcutaneous QHS  . lenalidomide  10 mg  Oral Daily  . pantoprazole  40 mg Oral BID AC  . primidone  100 mg Oral BID  . propranolol  40 mg Oral BID  . temazepam  15 mg Oral QHS  . topiramate  200 mg Oral QHS   Continuous Infusions: . sodium chloride 125 mL/hr at 07/28/20 0600    Procedures/Studies: MR PELVIS W WO CONTRAST  Addendum Date: 07/19/2020   ADDENDUM REPORT: 07/19/2020 16:24 ADDENDUM: Dictation error in the musculoskeletal findings section above. Multiple lesions in the bilateral bony pelvis and proximal  FEMURS, measuring up to 9 mm on the left, suggesting metastases/lymphomatous involvement. Remainder of the report, including the impression, is unchanged. Electronically Signed   By: Julian Hy M.D.   On: 07/19/2020 16:24   Result Date: 07/19/2020 CLINICAL DATA:  Diffuse large B-cell lymphoma, pelvic lymphadenopathy EXAM: MRI PELVIS WITHOUT AND WITH CONTRAST TECHNIQUE: Multiplanar multisequence MR imaging of the pelvis was performed both before and after administration of intravenous contrast. CONTRAST:  58mL GADAVIST GADOBUTROL 1 MMOL/ML IV SOLN COMPARISON:  MRI pelvis dated 05/31/2020 FINDINGS: Urinary Tract: Irregular bladder wall thickening. Indwelling Foley catheter. Bowel: Wall thickening involving the left posterolateral rectum (series 15/image 29), new from the prior, likely reflecting tumor involvement. Vascular/Lymphatic: No evidence of aneurysm. Multifocal tumor along the left posterior pelvic sidewall. In aggregate, this measures 6.0 x 2.3 cm (series 11/image 22), previously 6.2 x 4.4 cm, improved. However, this likely reflects several smaller discrete nodes, including a 16 mm short axis node superiorly (series 12/image 19) and inferiorly (series 12/image 21). Tumor continues to involve the exiting S1-3 nerve roots on the left, particularly S2. New 15 mm short axis left external iliac node (series 12/image 20). New 11 mm short axis left deep inguinal node (series 12/image 23). Retroperitoneal lymphadenopathy,  incompletely visualized, including a 2.0 cm short axis right common iliac node and a 1.9 cm short axis left common iliac node (series 12/image 8). Reproductive:  Prostatomegaly, suggesting BPH. Other:  No pelvic ascites. Musculoskeletal: Tumor involvement of the left iliopsoas musculature (series 15/images 1 and 5). Multiple lesions in the bilateral bony pelvis and proximal humerii, measuring up to 9 mm on the left (series 10/image 10), suggesting metastases/lymphomatous involvement. IMPRESSION: Retroperitoneal, left posterior pelvic, and left inguinal lymphadenopathy, as described above, progressive. Tumor likely involves the left iliopsoas musculature. Tumor continues to involve the exiting S1-3 nerve roots on the left, particularly S2. Tumor likely involves the left posterolateral rectum. Irregular bladder wall thickening, poorly evaluated. Indwelling Foley catheter. Suspected multifocal osseous metastases/lymphomatous involvement. Correlate with pending PET-CT. Electronically Signed: By: Julian Hy M.D. On: 07/19/2020 10:37   NM PET Image Initial (PI) Skull Base To Thigh  Result Date: 07/19/2020 CLINICAL DATA:  Subsequent treatment strategy for diffuse large B-cell lymphoma. EXAM: NUCLEAR MEDICINE PET SKULL BASE TO THIGH TECHNIQUE: 8.8 mCi F-18 FDG was injected intravenously. Full-ring PET imaging was performed from the skull base to thigh after the radiotracer. CT data was obtained and used for attenuation correction and anatomic localization. Fasting blood glucose: 113 mg/dl COMPARISON:  MR pelvis dated 07/20/2019.  PET-CT dated 04/26/2020. FINDINGS: Mediastinal blood pool activity: SUV max 3.0 Liver activity: SUV max 4.6 NECK: 2.7 cm right pharyngeal lesion (series 4/image 20), max SUV 11.2, new. Numerous right cervical nodes, including a 10 mm short axis right level 2 node (series 4/image 29), max SUV 5.6, new. Incidental CT findings: none CHEST: No hypermetabolic thoracic lymphadenopathy. No  suspicious pulmonary nodules. Left chest port terminates in the lower SVC. Incidental CT findings: Biapical pleural-parenchymal scarring. Mild centrilobular and paraseptal emphysematous changes, upper lung predominant. Atherosclerotic calcifications of the aortic arch. Three vessel coronary atherosclerosis. ABDOMEN/PELVIS: 1.9 cm metastasis inferiorly in the right hepatic lobe (series 4/image 124), max SUV 11.0, new. Reference splenic hypermetabolism max SUV 3.6, without focal lesion. No abnormal hypermetabolism in the pancreas or adrenal glands. Retroperitoneal/pelvic lymphadenopathy, including: --15 mm short axis node at the aortic bifurcation (series 4/image 152), max SUV 21.5, new --19 mm short axis right common iliac node (series 4/image 156), max SUV 17.0, new --  14 mm short axis left external iliac node (series 4/image 172), max SUV 15.6, new --6.1 x 2.3 cm nodal mass along the left posterior pelvic sidewall (series 4/image 175), similar to prior pelvic MR (July 2021) but progressive from prior PET, max SUV 17.5, previously 10.7 Abnormal soft tissue along the left posterior aspect of the rectum (series 4/image 26), max SUV 15.6, new. Irregular masslike wall thickening involving the bladder (series 4/image 180), raising concern for underlying neoplasm. Bladder is decompressed by indwelling Foley catheter with associated nondependent gas. This is poorly evaluated on PET due to excretory radiotracer. Incidental CT findings: Left renal cysts. Layering bladder calculi. Mild pelvic mesenteric stranding. SKELETON: Tumor involvement of the left iliopsoas musculature, max SUV 19.5, new. Additional involvement of the medial right psoas muscle, max SUV 18.6, new. Multifocal osseous metastases involving the visualized axial and appendicular skeleton, including the bilateral proximal humerii, right acromion and scapula, multiple right ribs, multiple thoracolumbar vertebral bodies, bilateral pelvis, and bilateral proximal  femurs. Representative lesions include: --Left T11 vertebral body, max SUV 10.2 --Right L3 vertebral body, max SUV 9.8 --Left iliac crest, max SUV 21.5 Incidental CT findings: Degenerative changes of the visualized thoracolumbar spine. IMPRESSION: Progressive retroperitoneal/pelvic lymphadenopathy, as described above. Associated involvement of the bilateral iliopsoas musculature as well as the left posterolateral rectum, new. New hepatic metastasis. Irregular masslike wall thickening involving the bladder, raising concern for underlying neoplasm. Consider cystoscopy if clinically warranted. Right pharyngeal lesion, new. This presumably reflects lymphoma but is amenable to visual inspection and sampling if there is concern for primary head/neck cancer. Associated cervical nodal metastases. Multifocal osseous metastases/lymphomatous involvement in the visualized axial and appendicular skeleton. Electronically Signed   By: Julian Hy M.D.   On: 07/19/2020 16:22   DG Knee Complete 4 Views Left  Result Date: 07/14/2020 CLINICAL DATA:  Left knee pain.  Recurrent falls. EXAM: LEFT KNEE - COMPLETE 4+ VIEW COMPARISON:  None. FINDINGS: Mild tricompartmental peripheral spurring. The joint spaces are preserved. Trace joint effusion. There is a 3 cm well-defined osseous projection from the lateral femoral metaphysis directed away from the joint with cortical and medullary continuity consistent with osteochondroma. No fracture, erosion or bony destruction. IMPRESSION: 1. Mild tricompartmental osteoarthritis with trace joint effusion. No acute fracture. 2. Elongated 3 cm osteochondroma from the lateral femur. In the absence of focal pain in this region, no dedicated further imaging is needed. If there is focal pain, consider further evaluation with MRI. Electronically Signed   By: Keith Rake M.D.   On: 07/14/2020 22:27   DG HIP UNILAT WITH PELVIS 2-3 VIEWS LEFT  Result Date: 07/14/2020 CLINICAL DATA:  Left hip  pain.  Recurrent falls. EXAM: DG HIP (WITH OR WITHOUT PELVIS) 2-3V LEFT COMPARISON:  None. FINDINGS: Mild osteoarthritis of the left hip with minimal joint space narrowing and femoral head spurring. Mild right hip osteoarthritis also seen. Pubic rami are intact. The sacroiliac joints are congruent. There is no evidence of fracture, avascular necrosis, focal bone lesion or bony destruction. Soft tissues are unremarkable. IMPRESSION: Mild osteoarthritis of the left hip. Electronically Signed   By: Keith Rake M.D.   On: 07/14/2020 22:24    Orson [name], DO  [facility]  If 7PM-7AM, please contact night-coverage [email or web address] Password [facility] 07/28/2020, 11:50 AM   LOS: 1 day

## 2020-07-28 NOTE — Evaluation (Signed)
Physical Therapy Evaluation Patient Details Name: Daniel Richards MRN: [id number] DOB: 1946/08/30 Today's Date: 07/28/2020   History of Present Illness  Daniel Richards is a 74 y.o. male with medical history significant of lymphoma who is undergoing chemotherapy, presented to the cancer center today for follow-up visit.  It was noted that patient was increasingly weak, had been falling recently, had increasing pain in his left thigh.  He denies any fever, shortness of breath.  He has a chronic unchanged cough.  No vomiting or diarrhea.  He is fully vaccinated for COVID-19.  He was evaluated the cancer center where basic labs were drawn and he was found to be significantly hypercalcemic.  He received IV fluids and zoledronic acid.  His family felt that they could not manage him at home in his current state.  Admission was requested for further management.    Clinical Impression  Patient demonstrates slow labored movement for sitting up at bedside requiring assistance to move legs, frequent falling over the left after seated at bedside, very unsteady on feet and at high risk for falls due to weakness and poor standing balance while using RW.  Patient limited to a few sides requiring tactile assistance to move LLE and tolerated sitting up in chair after therapy - RN aware.  Patient will benefit from continued physical therapy in hospital and recommended venue below to increase strength, balance, endurance for safe ADLs and gait.     Follow Up Recommendations SNF    Equipment Recommendations  None recommended by PT    Recommendations for Other Services       Precautions / Restrictions Precautions Precautions: Fall Restrictions Weight Bearing Restrictions: No      Mobility  Bed Mobility Overal bed mobility: Needs Assistance Bed Mobility: Supine to Sit     Supine to sit: Mod assist;Max assist     General bed mobility comments: slow labored movement requring frequent verbal/tactile  cueing  Transfers Overall transfer level: Needs assistance Equipment used: Rolling walker (2 wheeled) Transfers: Sit to/from [facility] Sit to Stand: Mod assist;Max assist Stand pivot transfers: Mod assist;Max assist       General transfer comment: very unsteady on feet with difficlty advancing LLE due to weakness/pain  Ambulation/Gait Ambulation/Gait assistance: Max assist Gait Distance (Feet): 3 Feet Assistive device: Rolling walker (2 wheeled) Gait Pattern/deviations: Decreased step length - right;Decreased step length - left;Decreased stance time - left;Decreased stride length Gait velocity: slow   General Gait Details: limited to 2-3 very slow unsteady labored steps with legs giving way due to weakness  Stairs            Wheelchair Mobility    Modified Rankin (Stroke Patients Only)       Balance Overall balance assessment: Needs assistance Sitting-balance support: Feet supported;Bilateral upper extremity supported Sitting balance-Leahy Scale: Poor Sitting balance - Comments: frequent fall over to the left while seated at EOB Postural control: Left lateral lean Standing balance support: During functional activity;Bilateral upper extremity supported Standing balance-Leahy Scale: Poor Standing balance comment: using RW                             Pertinent Vitals/Pain Pain Assessment: Faces Faces Pain Scale: Hurts little more Pain Location: LLE Pain Descriptors / Indicators: Sore;Discomfort Pain Intervention(s): Limited activity within patient's tolerance;Monitored during session;RN gave pain meds during session;Repositioned    Home Living Family/patient expects to be discharged to:: Private residence Living Arrangements:  Other relatives (Patient states her daughter and Son-n-law lives with him) Available Help at Discharge: Family;Available 24 hours/day Type of Home: House Home Access: Ramped entrance     Home Layout: One  level Home Equipment: Cane - single point;Walker - 2 wheels;Grab bars - tub/shower;Bedside commode;Shower seat;Toilet riser;Hand held shower head      Prior Function Level of Independence: Needs assistance   Gait / Transfers Assistance Needed: Household ambulator using RW  ADL's / Homemaking Assistance Needed: assisted by daughter  Comments: pt has been living with daughter since d/c from last hospital stay due to assistance needed     Hand Dominance   Dominant Hand: Right    Extremity/Trunk Assessment   Upper Extremity Assessment Upper Extremity Assessment: Generalized weakness    Lower Extremity Assessment Lower Extremity Assessment: Generalized weakness    Cervical / Trunk Assessment Cervical / Trunk Assessment: Kyphotic  Communication   Communication: No difficulties  Cognition Arousal/Alertness: Awake/alert Behavior During Therapy: WFL for tasks assessed/performed Overall Cognitive Status: Within Functional Limits for tasks assessed                                        General Comments      Exercises     Assessment/Plan    PT Assessment Patient needs continued PT services  PT Problem List Decreased strength;Decreased activity tolerance;Decreased balance;Decreased mobility       PT Treatment Interventions DME instruction;Gait training;Stair training;Functional mobility training;Therapeutic activities;Therapeutic exercise;Patient/family education;Balance training;Wheelchair mobility training    PT Goals (Current goals can be found in the Care Plan section)  Acute Rehab PT Goals Patient Stated Goal: return home with family to assist PT Goal Formulation: With patient Time For Goal Achievement: 08/11/20 Potential to Achieve Goals: Good    Frequency Min 3X/week   Barriers to discharge        Co-evaluation               AM-PAC PT "6 Clicks" Mobility  Outcome Measure Help needed turning from your back to your side while in a  flat bed without using bedrails?: A Lot Help needed moving from lying on your back to sitting on the side of a flat bed without using bedrails?: A Lot Help needed moving to and from a bed to a chair (including a wheelchair)?: A Lot Help needed standing up from a chair using your arms (e.g., wheelchair or bedside chair)?: A Lot Help needed to walk in hospital room?: Total Help needed climbing 3-5 steps with a railing? : Total 6 Click Score: 10    End of Session   Activity Tolerance: Patient tolerated treatment well;Patient limited by fatigue Patient left: in chair;with call bell/phone within reach Nurse Communication: Mobility status PT Visit Diagnosis: Unsteadiness on feet (R26.81);Other abnormalities of gait and mobility (R26.89);Muscle weakness (generalized) (M62.81)    Time: 7902-4097 PT Time Calculation (min) (ACUTE ONLY): 30 min   Charges:   PT Evaluation $PT Eval Moderate Complexity: 1 Mod PT Treatments $Therapeutic Activity: 23-37 mins        12:22 PM, 07/28/20 Lonell Grandchild, MPT Physical Therapist with [facility] [phone] office [phone] mobile phone

## 2020-07-28 NOTE — Telephone Encounter (Signed)
Chemotherapy 24 hour follow up.  Patient is still admitted from yesterday's infusion for possible SNF admission.

## 2020-07-29 DIAGNOSIS — C8510 Unspecified B-cell lymphoma, unspecified site: Secondary | ICD-10-CM

## 2020-07-29 DIAGNOSIS — Z515 Encounter for palliative care: Secondary | ICD-10-CM

## 2020-07-29 DIAGNOSIS — G893 Neoplasm related pain (acute) (chronic): Secondary | ICD-10-CM

## 2020-07-29 DIAGNOSIS — E1142 Type 2 diabetes mellitus with diabetic polyneuropathy: Secondary | ICD-10-CM

## 2020-07-29 LAB — RESPIRATORY PANEL BY RT PCR (FLU A&B, COVID)
Influenza A by PCR: NEGATIVE
Influenza B by PCR: NEGATIVE
SARS Coronavirus 2 by RT PCR: NEGATIVE

## 2020-07-29 LAB — URINE CULTURE

## 2020-07-29 LAB — GLUCOSE, CAPILLARY
Glucose-Capillary: 110 mg/dL — ABNORMAL HIGH (ref 70–99)
Glucose-Capillary: 122 mg/dL — ABNORMAL HIGH (ref 70–99)

## 2020-07-29 MED ORDER — ATROPINE SULFATE 1 % OP SOLN: 2.0000 [drp] | Freq: Four times a day (QID) | OPHTHALMIC | Status: DC | PRN

## 2020-07-29 MED ORDER — SENNA 8.6 MG PO TABS: 1.0000 | ORAL_TABLET | Freq: Every day | ORAL | Status: DC | PRN

## 2020-07-29 MED ORDER — OXYCODONE HCL 5 MG PO TABS
10.0000 mg | ORAL_TABLET | ORAL | Status: DC | PRN
  Administered 2020-07-29 (×2): 10 mg via ORAL
  Filled 2020-07-29 (×2): qty 2

## 2020-07-29 MED ORDER — ALPRAZOLAM 0.25 MG PO TABS: 0.2500 mg | ORAL_TABLET | Freq: Two times a day (BID) | ORAL | Status: DC | PRN

## 2020-07-29 MED ORDER — CHLORHEXIDINE GLUCONATE CLOTH 2 % EX PADS
6.0000 | MEDICATED_PAD | Freq: Every day | CUTANEOUS | Status: DC
  Administered 2020-07-29: 6 via TOPICAL

## 2020-07-29 MED ORDER — LORAZEPAM 2 MG/ML IJ SOLN: 0.5000 mg | Freq: Once | INTRAMUSCULAR | Status: DC

## 2020-07-29 NOTE — Progress Notes (Signed)
Daily Progress Note   Patient Name: Daniel Richards       Date: 07/29/2020 DOB: Nov 18, 1945  Age: 74 y.o. MRN#: [id number] Attending Physician: Orson [name], MD Primary Care Physician: [name], FNP Admit Date: 07/27/2020  Reason for Consultation/Follow-up: Establishing goals of care and Terminal Care  Subjective: Patient wakes to voice but drowsy. Complains of back and pelvic pain. Dilaudid 'eases off' the pain. Tells me his nerves are 'shot.' Reassured patient that I will review his symptom management medications. Explained to patient focus on comfort and symptom management. Notified RN to give prn oxycodone.  GOC:  No family at bedside. Spoke with daughter, Daniel Richards via telephone. She is appreciative of conversation with Dr. Carles Collet yesterday. Daniel Richards confirms plan for focus on comfort and transition to [facility] facility. "Best for dad." Discussed hospice philosophy and focus on comfort, symptom management, peace/dignity at EOL and allowing nature to take course. Discussed symptom management medications. Discussed comfort feeds and unrestricted visitor access. Answered questions. PMT contact information given and encouraged Daniel Richards to call me this afternoon if any questions.   Length of Stay: 2  Current Medications: Scheduled Meds:   allopurinol  300 mg Oral Daily   apixaban  5 mg Oral BID   Chlorhexidine Gluconate Cloth  6 each Topical Daily   citalopram  40 mg Oral QHS   cyclobenzaprine  10 mg Oral QHS   gabapentin  600 mg Oral TID   influenza vaccine adjuvanted  0.5 mL Intramuscular Tomorrow-1000   lenalidomide  10 mg Oral Daily   pantoprazole  40 mg Oral BID AC   primidone  100 mg Oral BID   propranolol  40 mg Oral BID   temazepam  15 mg Oral QHS    topiramate  200 mg Oral QHS    Continuous Infusions:  sodium chloride 125 mL/hr at 07/28/20 1400    PRN Meds: acetaminophen **OR** acetaminophen, HYDROmorphone (DILAUDID) injection, ondansetron **OR** ondansetron (ZOFRAN) IV, oxyCODONE  Physical Exam Vitals and nursing note reviewed.  Constitutional:      Appearance: He is ill-appearing.  HENT:     Head: Normocephalic and atraumatic.  Pulmonary:     Effort: No tachypnea, accessory muscle usage or respiratory distress.  Neurological:     Mental Status: He is easily aroused.     Comments:  Drowsy            Vital Signs: BP (!) 115/49 (BP Location: Left Arm)    Pulse 81    Temp 99.2 F (37.3 C) (Oral)    Resp 16    Ht 6' (1.829 m)    Wt 80.2 kg    SpO2 97%    BMI 23.98 kg/m  SpO2: SpO2: 97 % O2 Device: O2 Device: Room Air O2 Flow Rate:    Intake/output summary:   Intake/Output Summary (Last 24 hours) at 07/29/2020 1132 Last data filed at 07/28/2020 1800 Gross per 24 hour  Intake 933.18 ml  Output 400 ml  Net 533.18 ml   LBM:   Baseline Weight: Weight: 80.2 kg Most recent weight: Weight: 80.2 kg       Palliative Assessment/Data: PPS 30%      Patient Active Problem List   Diagnosis Date Noted   Chronic indwelling Foley catheter 07/27/2020   Fall at home, initial encounter 07/27/2020   Left leg pain 07/27/2020   Chronic low back pain 07/27/2020   Other specified types of non-hodgkin lymphoma, intrapelvic lymph nodes ([place]) 05/14/2020   Incomplete emptying of bladder 01/22/2020   Sepsis ([place]) 01/15/2020   Hypokalemia    Acute cystitis without hematuria    Epiploic appendagitis 09/27/2019   Acute saddle pulmonary embolism with acute cor pulmonale (HCC)    Urinary tract infection associated with indwelling urethral catheter (HCC)    Febrile neutropenia ([place]) 08/26/2019   PE (pulmonary thromboembolism) ([place]) 08/26/2019   Neutropenia, drug-induced ([place]) 08/20/2019   Diffuse large B cell lymphoma  ([place]) 08/20/2019   Port-A-Cath in place 08/13/2019   Anemia of chronic disease 08/07/2019   Major depression, recurrent, chronic ([place]) 08/07/2019   BPH without urinary obstruction 08/07/2019   Aortic atherosclerosis ([place]) 08/07/2019   Diabetic peripheral neuropathy ([place]) 08/07/2019   Hypertension associated with type 2 diabetes mellitus ([place]) 08/07/2019   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, unspecified B-cell lymphoma type  08/04/2019   Odynophagia    Acute kidney injury ([place])    Falls frequently    Generalized abdominal pain    Goals of care, counseling/discussion    Palliative care by specialist    Folate deficiency    Generalized weakness 07/25/2019   Intra-abdominal lymphadenopathy 07/25/2019   Retroperitoneal lymphadenopathy 07/25/2019   Malnutrition of moderate degree 07/25/2019   Tremor    Hypercalcemia 07/24/2019   B12 deficiency 11/18/2016   Weakness generalized 11/16/2016   Altered mental status, unspecified 11/16/2016   Fracture, rib 11/16/2016   Weakness 11/16/2016   New onset of headaches after age 34 09/01/2015   Morning headache 09/01/2015   Daytime somnolence 09/01/2015   Essential tremor 09/01/2015   Intractable headache 09/01/2015   Type 2 diabetes, controlled, with peripheral neuropathy ([place]) 06/09/2011    Palliative Care Assessment & Plan   Patient Profile: Per attending: 74 y.o.malewith medical history significant oflymphoma who is undergoing chemotherapy, presented to the cancer center 07/27/20 for follow-up visit.  It was noted that patient was increasingly weak, had been falling recently, had increasing pain in his left thigh. He denies any fever, shortness of breath. He has a chronic unchanged cough. No vomiting or diarrhea. He is fully vaccinated for COVID-19. He was evaluated the cancer center where basic labs were drawn and he was found to be significantly hypercalcemic. He received  IV fluids and zoledronic acid. His family felt that they could not manage him at home in his current state.  Admission was requested for further management.  Assessment: B-cell lymphoma Hypercalcemia of malignancy Chronic cancer related pain Failure to thrive Urinary retention s/p indwelling foley catheter Hx of bilateral PE's Essential tremor Depression/anxiety  Recommendations/Plan:  Comfort focused care plan.   Symptom management meds on MAR.  TOC team following. Daughter confirms plan for transfer to [facility] facility when bed available.   Comfort feeds per patient/family request.  Unrestricted visitor access.   Code Status: DNR   Code Status Orders  (From admission, onward)         Start     Ordered   07/27/20 1750  Do not attempt resuscitation (DNR)  Continuous       Question Answer Comment  In the event of cardiac or respiratory ARREST Do not call a code blue   In the event of cardiac or respiratory ARREST Do not perform Intubation, CPR, defibrillation or ACLS   In the event of cardiac or respiratory ARREST Use medication by any route, position, wound care, and other measures to relive pain and suffering. May use oxygen, suction and manual treatment of airway obstruction as needed for comfort.      07/27/20 1758        Code Status History    Date Active Date Inactive Code Status Order ID Comments User Context   01/15/2020 2238 01/18/2020 1849 Full Code [id number]  Bethena Roys, MD Inpatient   09/27/2019 1633 10/07/2019 1621 Full Code [id number]  Roxan Hockey, MD ED   08/27/2019 0043 08/31/2019 1952 Full Code [id number]  Bethena Roys, MD ED   08/15/2019 1726 08/17/2019 1830 Full Code [id number]  Truett Mainland, DO ED   07/25/2019 0208 08/01/2019 1625 DNR [id number]  Oswald Hillock, MD Inpatient   11/16/2016 2350 11/18/2016 2259 Full Code [id number]  Orvan Falconer, MD Inpatient   Advance Care Planning Activity    Advance Directive  Documentation     Most Recent Value  Type of Advance Directive Healthcare Power of Attorney, Living will  Pre-existing out of facility DNR order (yellow form or pink MOST form) --  "MOST" Form in Place? --       Prognosis:   Poor prognosis  Discharge Planning:  Hospice facility  Care plan was discussed with Dr. Carles Collet, RN, daughter Daniel Richards), patient  Thank you for allowing the Palliative Medicine Team to assist in the care of this patient.   Total Time 25 Prolonged Time Billed   no      Greater than 50%  of this time was spent counseling and coordinating care related to the above assessment and plan.  Ihor Dow, DNP, FNP-C Palliative Medicine Team  Phone: [phone] Fax: [phone]  Please contact Palliative Medicine Team phone at [phone] for questions and concerns.

## 2020-07-29 NOTE — Progress Notes (Signed)
Delays in [employer]

## 2020-07-29 NOTE — Progress Notes (Signed)
Present with Daniel Richards and daughter, Daniel Richards and son, Daniel Richards for emotional and spiritual support. Listening support for faith and life review, both were tearful and appropriately expressive of their grief. We prayed together especially for the [facility] for them all.

## 2020-07-29 NOTE — Plan of Care (Signed)

## 2020-07-29 NOTE — Progress Notes (Signed)
Called report to Santiago Glad, Therapist, sports from [place] at [phone]. Discharge instructions given, all questions answered at this time. Family at bedside and has been updated. Await on EMS at this time.

## 2020-07-29 NOTE — Discharge Summary (Signed)
Physician Discharge Summary  Daniel Richards PIR:[id number] DOB: 29-Nov-1945 DOA: 07/27/2020  PCP: [name], FNP  Admit date: 07/27/2020 Discharge date: 07/29/2020  Admitted From: Home Disposition:  Residential Hospice  Recommendations for Outpatient Follow-up:  1. Follow up with PCP in 1-2 weeks 2. Please obtain BMP/CBC in one week     Discharge Condition: Stable CODE STATUS: FULL COMFORT Diet recommendation: Regular   Brief/Interim Summary: 74 y.o.malewith medical history significant oflymphoma who is undergoing chemotherapy, presented to the cancer center 07/27/20 for follow-up visit.  It was noted that patient was increasingly weak, had been falling recently, had increasing pain in his left thigh. He denies any fever, shortness of breath. He has a chronic unchanged cough. No vomiting or diarrhea. He is fully vaccinated for COVID-19. He was evaluated the cancer center where basic labs were drawn and he was found to be significantly hypercalcemic. He received IV fluids and zoledronic acid. His family felt that they could not manage him at home in his current state. Admission was requested for further management. Patient was started on IVF with some improvement in his mental status.  He remained profoundly weak and intermittently confused and continued to complain of poorly controlled pain.  I had goals of care discussion with the patient's daughter after which she felt it was in the patient's best interest to transition the patient's focus of care to focus on his comfort.   With the assistance of case management, the patient will transfer to residential hospice of [facility].  Discharge Diagnoses:   Hypercalcemia of malignancy -Presented with serum corrected calcium 12.4 -The patient received zoledronic acid at the cancer center prior to his admission -Continued IV fluids during the hospitalization -07/28/2020 corrected calcium 12.08 -Changed to normal  saline -labs discontinued once patient's focus of care shifted to focus on full comfort.  B-cell lymphoma -Follows with Dr. Delton Coombes at [place] -6 cycles of R mini CHOP from 08/21/2019 through 01/21/2020. -PET scan on 04/26/2020 showed slight interval enlargement of hypermetabolic nodule in the deep left pelvis measuring 27 mm, SUV 10.4 increased in size and metabolic activity. 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 -PET scan on 07/19/2020 showed progressive retroperitoneal/pelvic lymphadenopathy with involvement of bilateral iliopsoas musculature, new solitary hepatic metastasis. Multifocal bone lesions present. -MRI of the pelvis on 07/19/2020 also showed multiple lesions in the bilateral bony pelvis and proximal femurs, adenopathy.  Failure to thrive -TSH--10.955, FT4--0.85 -Serum Y30--160 -Folic FUXN--23.5 -The patient has been getting progressively weaker.  He was not able to get out of his car on the morning of his cancer center appointment -UA [id number]  Low back and left hip pain/uncontrolled pain -Dr. Delton Coombes discussed with Dr. 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  Code status including  explanations of "Full Code" and "DNR" and alternatives were discussed in detail.  Discussion of end-of-life issues including but not limited palliative care, hospice care and the concept of hospice, other end-of-life care options, power of attorney for health care decisions, living wills, and physician orders for life-sustaining treatment were also discussed with the patient and family.  Total face to face time 16 minutes. -she does want to continue current therapy with the understanding that after transition to residential hospice, all meds with curative intent will be stopped  Discharge Instructions   Allergies as of 07/29/2020      Reactions   Vancomycin Hives   Patient received Benadryl to treat the hives      Medication List    STOP taking these medications   allopurinol 300 MG tablet Commonly known as: ZYLOPRIM   citalopram 20 MG tablet Commonly known as: CELEXA   cyclobenzaprine 10 MG tablet Commonly known as: FLEXERIL   Eliquis 5 MG Tabs tablet Generic drug: apixaban   Farxiga 10 MG Tabs tablet Generic drug: dapagliflozin propanediol   gabapentin 300 MG capsule Commonly known as: NEURONTIN   HYDROcodone-acetaminophen 10-325 MG tablet Commonly known as: NORCO   lenalidomide 10 MG capsule Commonly known as: REVLIMID   metFORMIN 500 MG tablet Commonly known as: GLUCOPHAGE   pantoprazole 40 MG tablet Commonly known as: PROTONIX   prochlorperazine 10 MG tablet Commonly known as: COMPAZINE   propranolol 40 MG tablet Commonly known as: INDERAL   tafasitamab-cxix 12 mg/kg in sodium chloride 0.9 %   topiramate 100 MG tablet Commonly known as: TOPAMAX     TAKE these medications   primidone 50 MG tablet Commonly known as: MYSOLINE Take 1.5 tablets (75 mg total) by mouth every 8 (eight) hours. What changed:   how much to take  when to take this   temazepam 15 MG capsule Commonly known as:  RESTORIL Take 1 capsule (15 mg total) by mouth at bedtime.       Allergies  Allergen Reactions  . Vancomycin Hives    Patient received Benadryl to treat the hives    Consultations:  Palliative medicine  Med/onc   Procedures/Studies: MR PELVIS W WO CONTRAST  Addendum Date: 07/19/2020   ADDENDUM REPORT: 07/19/2020 16:24 ADDENDUM: Dictation error in the musculoskeletal findings section above. Multiple lesions in the bilateral bony pelvis and proximal FEMURS, measuring up to 9 mm on the left, suggesting metastases/lymphomatous involvement. Remainder of the report, including the impression, is unchanged. Electronically Signed   By: Julian Hy M.D.   On: 07/19/2020 16:24   Result Date: 07/19/2020 CLINICAL DATA:  Diffuse large B-cell lymphoma, pelvic lymphadenopathy EXAM: MRI PELVIS WITHOUT AND WITH CONTRAST TECHNIQUE: Multiplanar multisequence MR imaging of the pelvis was performed both before and after administration of intravenous contrast. CONTRAST:  11mL GADAVIST GADOBUTROL 1 MMOL/ML IV SOLN COMPARISON:  MRI pelvis dated 05/31/2020 FINDINGS: Urinary Tract: Irregular bladder wall thickening. Indwelling Foley catheter. Bowel: Wall thickening involving the left posterolateral rectum (series 15/image 29), new from the prior, likely reflecting tumor involvement. Vascular/Lymphatic: No evidence of aneurysm. Multifocal tumor along the left posterior pelvic sidewall. In aggregate, this measures 6.0 x 2.3 cm (series 11/image 22), previously 6.2 x 4.4 cm, improved. However, this likely reflects several smaller discrete nodes, including a 16 mm short axis node superiorly (series 12/image 19) and inferiorly (series 12/image 21). Tumor continues to involve the exiting S1-3 nerve roots on the left, particularly S2. New 15 mm short axis left external iliac node (series  12/image 20). New 11 mm short axis left deep inguinal node (series 12/image 23). Retroperitoneal lymphadenopathy, incompletely  visualized, including a 2.0 cm short axis right common iliac node and a 1.9 cm short axis left common iliac node (series 12/image 8). Reproductive:  Prostatomegaly, suggesting BPH. Other:  No pelvic ascites. Musculoskeletal: Tumor involvement of the left iliopsoas musculature (series 15/images 1 and 5). Multiple lesions in the bilateral bony pelvis and proximal humerii, measuring up to 9 mm on the left (series 10/image 10), suggesting metastases/lymphomatous involvement. IMPRESSION: Retroperitoneal, left posterior pelvic, and left inguinal lymphadenopathy, as described above, progressive. Tumor likely involves the left iliopsoas musculature. Tumor continues to involve the exiting S1-3 nerve roots on the left, particularly S2. Tumor likely involves the left posterolateral rectum. Irregular bladder wall thickening, poorly evaluated. Indwelling Foley catheter. Suspected multifocal osseous metastases/lymphomatous involvement. Correlate with pending PET-CT. Electronically Signed: By: Julian Hy M.D. On: 07/19/2020 10:37   NM PET Image Initial (PI) Skull Base To Thigh  Result Date: 07/19/2020 CLINICAL DATA:  Subsequent treatment strategy for diffuse large B-cell lymphoma. EXAM: NUCLEAR MEDICINE PET SKULL BASE TO THIGH TECHNIQUE: 8.8 mCi F-18 FDG was injected intravenously. Full-ring PET imaging was performed from the skull base to thigh after the radiotracer. CT data was obtained and used for attenuation correction and anatomic localization. Fasting blood glucose: 113 mg/dl COMPARISON:  MR pelvis dated 07/20/2019.  PET-CT dated 04/26/2020. FINDINGS: Mediastinal blood pool activity: SUV max 3.0 Liver activity: SUV max 4.6 NECK: 2.7 cm right pharyngeal lesion (series 4/image 20), max SUV 11.2, new. Numerous right cervical nodes, including a 10 mm short axis right level 2 node (series 4/image 29), max SUV 5.6, new. Incidental CT findings: none CHEST: No hypermetabolic thoracic lymphadenopathy. No suspicious  pulmonary nodules. Left chest port terminates in the lower SVC. Incidental CT findings: Biapical pleural-parenchymal scarring. Mild centrilobular and paraseptal emphysematous changes, upper lung predominant. Atherosclerotic calcifications of the aortic arch. Three vessel coronary atherosclerosis. ABDOMEN/PELVIS: 1.9 cm metastasis inferiorly in the right hepatic lobe (series 4/image 124), max SUV 11.0, new. Reference splenic hypermetabolism max SUV 3.6, without focal lesion. No abnormal hypermetabolism in the pancreas or adrenal glands. Retroperitoneal/pelvic lymphadenopathy, including: --15 mm short axis node at the aortic bifurcation (series 4/image 152), max SUV 21.5, new --19 mm short axis right common iliac node (series 4/image 156), max SUV 17.0, new --14 mm short axis left external iliac node (series 4/image 172), max SUV 15.6, new --6.1 x 2.3 cm nodal mass along the left posterior pelvic sidewall (series 4/image 175), similar to prior pelvic MR (July 2021) but progressive from prior PET, max SUV 17.5, previously 10.7 Abnormal soft tissue along the left posterior aspect of the rectum (series 4/image 26), max SUV 15.6, new. Irregular masslike wall thickening involving the bladder (series 4/image 180), raising concern for underlying neoplasm. Bladder is decompressed by indwelling Foley catheter with associated nondependent gas. This is poorly evaluated on PET due to excretory radiotracer. Incidental CT findings: Left renal cysts. Layering bladder calculi. Mild pelvic mesenteric stranding. SKELETON: Tumor involvement of the left iliopsoas musculature, max SUV 19.5, new. Additional involvement of the medial right psoas muscle, max SUV 18.6, new. Multifocal osseous metastases involving the visualized axial and appendicular skeleton, including the bilateral proximal humerii, right acromion and scapula, multiple right ribs, multiple thoracolumbar vertebral bodies, bilateral pelvis, and bilateral proximal femurs.  Representative lesions include: --Left T11 vertebral body, max SUV 10.2 --Right L3 vertebral body, max SUV 9.8 --Left iliac crest, max SUV 21.5 Incidental CT  findings: Degenerative changes of the visualized thoracolumbar spine. IMPRESSION: Progressive retroperitoneal/pelvic lymphadenopathy, as described above. Associated involvement of the bilateral iliopsoas musculature as well as the left posterolateral rectum, new. New hepatic metastasis. Irregular masslike wall thickening involving the bladder, raising concern for underlying neoplasm. Consider cystoscopy if clinically warranted. Right pharyngeal lesion, new. This presumably reflects lymphoma but is amenable to visual inspection and sampling if there is concern for primary head/neck cancer. Associated cervical nodal metastases. Multifocal osseous metastases/lymphomatous involvement in the visualized axial and appendicular skeleton. Electronically Signed   By: Julian Hy M.D.   On: 07/19/2020 16:22   DG Knee Complete 4 Views Left  Result Date: 07/14/2020 CLINICAL DATA:  Left knee pain.  Recurrent falls. EXAM: LEFT KNEE - COMPLETE 4+ VIEW COMPARISON:  None. FINDINGS: Mild tricompartmental peripheral spurring. The joint spaces are preserved. Trace joint effusion. There is a 3 cm well-defined osseous projection from the lateral femoral metaphysis directed away from the joint with cortical and medullary continuity consistent with osteochondroma. No fracture, erosion or bony destruction. IMPRESSION: 1. Mild tricompartmental osteoarthritis with trace joint effusion. No acute fracture. 2. Elongated 3 cm osteochondroma from the lateral femur. In the absence of focal pain in this region, no dedicated further imaging is needed. If there is focal pain, consider further evaluation with MRI. Electronically Signed   By: Keith Rake M.D.   On: 07/14/2020 22:27   DG HIP UNILAT WITH PELVIS 2-3 VIEWS LEFT  Result Date: 07/14/2020 CLINICAL DATA:  Left hip pain.   Recurrent falls. EXAM: DG HIP (WITH OR WITHOUT PELVIS) 2-3V LEFT COMPARISON:  None. FINDINGS: Mild osteoarthritis of the left hip with minimal joint space narrowing and femoral head spurring. Mild right hip osteoarthritis also seen. Pubic rami are intact. The sacroiliac joints are congruent. There is no evidence of fracture, avascular necrosis, focal bone lesion or bony destruction. Soft tissues are unremarkable. IMPRESSION: Mild osteoarthritis of the left hip. Electronically Signed   By: Keith Rake M.D.   On: 07/14/2020 22:24        Discharge Exam: Vitals:   07/28/20 2142 07/29/20 0800  BP: 133/62 (!) 115/49  Pulse: 75 81  Resp: 17 16  Temp: (!) 100.5 F (38.1 C) 99.2 F (37.3 C)  SpO2: 96% 97%   Vitals:   07/28/20 1500 07/28/20 1644 07/28/20 2142 07/29/20 0800  BP: (!) 122/52 (!) 98/50 133/62 (!) 115/49  Pulse: (!) 58 67 75 81  Resp: 11 16 17 16   Temp:   (!) 100.5 F (38.1 C) 99.2 F (37.3 C)  TempSrc:    Oral  SpO2: 95% 98% 96% 97%  Weight:      Height:        General: Pt is alert, awake, not in acute distress Cardiovascular: RRR, S1/S2 +, no rubs, no gallops Respiratory: bibasilar rales. No wheeze Abdominal: Soft, NT, ND, bowel sounds + Extremities: no edema, no cyanosis   The results of significant diagnostics from this hospitalization (including imaging, microbiology, ancillary and laboratory) are listed below for reference.    Significant Diagnostic Studies: MR PELVIS W WO CONTRAST  Addendum Date: 07/19/2020   ADDENDUM REPORT: 07/19/2020 16:24 ADDENDUM: Dictation error in the musculoskeletal findings section above. Multiple lesions in the bilateral bony pelvis and proximal FEMURS, measuring up to 9 mm on the left, suggesting metastases/lymphomatous involvement. Remainder of the report, including the impression, is unchanged. Electronically Signed   By: Julian Hy M.D.   On: 07/19/2020 16:24   Result Date: 07/19/2020 CLINICAL DATA:  Diffuse large  B-cell lymphoma, pelvic lymphadenopathy EXAM: MRI PELVIS WITHOUT AND WITH CONTRAST TECHNIQUE: Multiplanar multisequence MR imaging of the pelvis was performed both before and after administration of intravenous contrast. CONTRAST:  32mL GADAVIST GADOBUTROL 1 MMOL/ML IV SOLN COMPARISON:  MRI pelvis dated 05/31/2020 FINDINGS: Urinary Tract: Irregular bladder wall thickening. Indwelling Foley catheter. Bowel: Wall thickening involving the left posterolateral rectum (series 15/image 29), new from the prior, likely reflecting tumor involvement. Vascular/Lymphatic: No evidence of aneurysm. Multifocal tumor along the left posterior pelvic sidewall. In aggregate, this measures 6.0 x 2.3 cm (series 11/image 22), previously 6.2 x 4.4 cm, improved. However, this likely reflects several smaller discrete nodes, including a 16 mm short axis node superiorly (series 12/image 19) and inferiorly (series 12/image 21). Tumor continues to involve the exiting S1-3 nerve roots on the left, particularly S2. New 15 mm short axis left external iliac node (series 12/image 20). New 11 mm short axis left deep inguinal node (series 12/image 23). Retroperitoneal lymphadenopathy, incompletely visualized, including a 2.0 cm short axis right common iliac node and a 1.9 cm short axis left common iliac node (series 12/image 8). Reproductive:  Prostatomegaly, suggesting BPH. Other:  No pelvic ascites. Musculoskeletal: Tumor involvement of the left iliopsoas musculature (series 15/images 1 and 5). Multiple lesions in the bilateral bony pelvis and proximal humerii, measuring up to 9 mm on the left (series 10/image 10), suggesting metastases/lymphomatous involvement. IMPRESSION: Retroperitoneal, left posterior pelvic, and left inguinal lymphadenopathy, as described above, progressive. Tumor likely involves the left iliopsoas musculature. Tumor continues to involve the exiting S1-3 nerve roots on the left, particularly S2. Tumor likely involves the left  posterolateral rectum. Irregular bladder wall thickening, poorly evaluated. Indwelling Foley catheter. Suspected multifocal osseous metastases/lymphomatous involvement. Correlate with pending PET-CT. Electronically Signed: By: Julian Hy M.D. On: 07/19/2020 10:37   NM PET Image Initial (PI) Skull Base To Thigh  Result Date: 07/19/2020 CLINICAL DATA:  Subsequent treatment strategy for diffuse large B-cell lymphoma. EXAM: NUCLEAR MEDICINE PET SKULL BASE TO THIGH TECHNIQUE: 8.8 mCi F-18 FDG was injected intravenously. Full-ring PET imaging was performed from the skull base to thigh after the radiotracer. CT data was obtained and used for attenuation correction and anatomic localization. Fasting blood glucose: 113 mg/dl COMPARISON:  MR pelvis dated 07/20/2019.  PET-CT dated 04/26/2020. FINDINGS: Mediastinal blood pool activity: SUV max 3.0 Liver activity: SUV max 4.6 NECK: 2.7 cm right pharyngeal lesion (series 4/image 20), max SUV 11.2, new. Numerous right cervical nodes, including a 10 mm short axis right level 2 node (series 4/image 29), max SUV 5.6, new. Incidental CT findings: none CHEST: No hypermetabolic thoracic lymphadenopathy. No suspicious pulmonary nodules. Left chest port terminates in the lower SVC. Incidental CT findings: Biapical pleural-parenchymal scarring. Mild centrilobular and paraseptal emphysematous changes, upper lung predominant. Atherosclerotic calcifications of the aortic arch. Three vessel coronary atherosclerosis. ABDOMEN/PELVIS: 1.9 cm metastasis inferiorly in the right hepatic lobe (series 4/image 124), max SUV 11.0, new. Reference splenic hypermetabolism max SUV 3.6, without focal lesion. No abnormal hypermetabolism in the pancreas or adrenal glands. Retroperitoneal/pelvic lymphadenopathy, including: --15 mm short axis node at the aortic bifurcation (series 4/image 152), max SUV 21.5, new --19 mm short axis right common iliac node (series 4/image 156), max SUV 17.0, new --14 mm  short axis left external iliac node (series 4/image 172), max SUV 15.6, new --6.1 x 2.3 cm nodal mass along the left posterior pelvic sidewall (series 4/image 175), similar to prior pelvic MR (July 2021) but progressive from prior PET,  max SUV 17.5, previously 10.7 Abnormal soft tissue along the left posterior aspect of the rectum (series 4/image 26), max SUV 15.6, new. Irregular masslike wall thickening involving the bladder (series 4/image 180), raising concern for underlying neoplasm. Bladder is decompressed by indwelling Foley catheter with associated nondependent gas. This is poorly evaluated on PET due to excretory radiotracer. Incidental CT findings: Left renal cysts. Layering bladder calculi. Mild pelvic mesenteric stranding. SKELETON: Tumor involvement of the left iliopsoas musculature, max SUV 19.5, new. Additional involvement of the medial right psoas muscle, max SUV 18.6, new. Multifocal osseous metastases involving the visualized axial and appendicular skeleton, including the bilateral proximal humerii, right acromion and scapula, multiple right ribs, multiple thoracolumbar vertebral bodies, bilateral pelvis, and bilateral proximal femurs. Representative lesions include: --Left T11 vertebral body, max SUV 10.2 --Right L3 vertebral body, max SUV 9.8 --Left iliac crest, max SUV 21.5 Incidental CT findings: Degenerative changes of the visualized thoracolumbar spine. IMPRESSION: Progressive retroperitoneal/pelvic lymphadenopathy, as described above. Associated involvement of the bilateral iliopsoas musculature as well as the left posterolateral rectum, new. New hepatic metastasis. Irregular masslike wall thickening involving the bladder, raising concern for underlying neoplasm. Consider cystoscopy if clinically warranted. Right pharyngeal lesion, new. This presumably reflects lymphoma but is amenable to visual inspection and sampling if there is concern for primary head/neck cancer. Associated cervical  nodal metastases. Multifocal osseous metastases/lymphomatous involvement in the visualized axial and appendicular skeleton. Electronically Signed   By: Julian Hy M.D.   On: 07/19/2020 16:22   DG Knee Complete 4 Views Left  Result Date: 07/14/2020 CLINICAL DATA:  Left knee pain.  Recurrent falls. EXAM: LEFT KNEE - COMPLETE 4+ VIEW COMPARISON:  None. FINDINGS: Mild tricompartmental peripheral spurring. The joint spaces are preserved. Trace joint effusion. There is a 3 cm well-defined osseous projection from the lateral femoral metaphysis directed away from the joint with cortical and medullary continuity consistent with osteochondroma. No fracture, erosion or bony destruction. IMPRESSION: 1. Mild tricompartmental osteoarthritis with trace joint effusion. No acute fracture. 2. Elongated 3 cm osteochondroma from the lateral femur. In the absence of focal pain in this region, no dedicated further imaging is needed. If there is focal pain, consider further evaluation with MRI. Electronically Signed   By: Keith Rake M.D.   On: 07/14/2020 22:27   DG HIP UNILAT WITH PELVIS 2-3 VIEWS LEFT  Result Date: 07/14/2020 CLINICAL DATA:  Left hip pain.  Recurrent falls. EXAM: DG HIP (WITH OR WITHOUT PELVIS) 2-3V LEFT COMPARISON:  None. FINDINGS: Mild osteoarthritis of the left hip with minimal joint space narrowing and femoral head spurring. Mild right hip osteoarthritis also seen. Pubic rami are intact. The sacroiliac joints are congruent. There is no evidence of fracture, avascular necrosis, focal bone lesion or bony destruction. Soft tissues are unremarkable. IMPRESSION: Mild osteoarthritis of the left hip. Electronically Signed   By: Keith Rake M.D.   On: 07/14/2020 22:24     Microbiology: Recent Results (from the past 240 hour(s))  SARS Coronavirus 2 by RT PCR (hospital order, performed in [place] hospital lab)     Status: None   Collection Time: 07/27/20  3:24 PM  Result Value Ref Range  Status   SARS Coronavirus 2 NEGATIVE NEGATIVE Final    Comment: (NOTE) SARS-CoV-2 target nucleic acids are NOT DETECTED.  The SARS-CoV-2 RNA is generally detectable in upper and lower respiratory specimens during the acute phase of infection. The lowest concentration of SARS-CoV-2 viral copies this assay can detect is 250 copies /  mL. A negative result does not preclude SARS-CoV-2 infection and should not be used as the sole basis for treatment or other patient management decisions.  A negative result may occur with improper specimen collection / handling, submission of specimen other than nasopharyngeal swab, presence of viral mutation(s) within the areas targeted by this assay, and inadequate number of viral copies (<250 copies / mL). A negative result must be combined with clinical observations, patient history, and epidemiological information.  Fact Sheet for Patients:   StrictlyIdeas.no  Fact Sheet for Healthcare Providers: BankingDealers.co.za  This test is not yet approved or  cleared by the [place] FDA and has been authorized for detection and/or diagnosis of SARS-CoV-2 by FDA under an Emergency Use Authorization (EUA).  This EUA will remain in effect (meaning this test can be used) for the duration of the COVID-19 declaration under Section 564(b)(1) of the Act, 21 U.S.C. section 360bbb-3(b)(1), unless the authorization is terminated or revoked sooner.  Performed at [facility], [address], [place], [place] [address]   MRSA PCR Screening     Status: None   Collection Time: 07/27/20  3:49 PM   Specimen: Nasopharyngeal Swab  Result Value Ref Range Status   MRSA by PCR NEGATIVE NEGATIVE Final    Comment:        The GeneXpert MRSA Assay (FDA approved for NASAL specimens only), is one component of a comprehensive MRSA colonization surveillance program. It is not intended to diagnose MRSA infection nor to guide  or monitor treatment for MRSA infections. Performed at [facility], [address], [place], [place] [address]      Labs: Basic Metabolic Panel: Recent Labs  Lab 07/27/20 0841 07/28/20 0339  NA 136 137  K 4.6 4.5  CL 101 103  CO2 26 25  GLUCOSE 140* 94  BUN 29* 34*  CREATININE 1.74* 1.83*  CALCIUM 11.7* 11.2*  MG 2.0  --    Liver Function Tests: Recent Labs  Lab 07/27/20 0841 07/28/20 0339  AST 16 12*  ALT 14 13  ALKPHOS 111 93  BILITOT 0.4 0.3  PROT 7.6 6.6  ALBUMIN 3.1* 2.9*   No results for input(s): LIPASE, AMYLASE in the last 168 hours. No results for input(s): AMMONIA in the last 168 hours. CBC: Recent Labs  Lab 07/27/20 0841 07/28/20 0339  WBC 5.8 4.5  NEUTROABS 3.9  --   HGB 9.5* 8.2*  HCT 30.3* 26.9*  MCV 103.4* 103.1*  PLT 374 342   Cardiac Enzymes: No results for input(s): CKTOTAL, CKMB, CKMBINDEX, TROPONINI in the last 168 hours. BNP: Invalid input(s): POCBNP CBG: Recent Labs  Lab 07/28/20 1129 07/28/20 1603 07/28/20 2143 07/29/20 0736 07/29/20 1117  GLUCAP 126* 129* 93 110* 122*    Time coordinating discharge:  36 minutes  Signed:  Orson [name], DO [facility] Pager: [phone] 07/29/2020, 2:08 PM

## 2020-07-30 ENCOUNTER — Telehealth: Payer: Self-pay | Admitting: General Practice

## 2020-07-30 ENCOUNTER — Other Ambulatory Visit: Payer: Self-pay

## 2020-07-30 ENCOUNTER — Encounter (HOSPITAL_COMMUNITY): Payer: Self-pay

## 2020-07-30 ENCOUNTER — Other Ambulatory Visit (HOSPITAL_COMMUNITY): Payer: Self-pay | Admitting: Nurse Practitioner

## 2020-07-30 ENCOUNTER — Ambulatory Visit (HOSPITAL_COMMUNITY): Payer: Medicare Other

## 2020-07-30 ENCOUNTER — Inpatient Hospital Stay (HOSPITAL_COMMUNITY)
Admission: EM | Admit: 2020-07-30 | Discharge: 2020-08-03 | DRG: 641 | Disposition: A | Discharge status: Skilled Nursing Facility | Payer: Medicare Other | Attending: Internal Medicine | Admitting: Internal Medicine

## 2020-07-30 DIAGNOSIS — Z20822 Contact with and (suspected) exposure to covid-19: Secondary | ICD-10-CM | POA: Diagnosis present

## 2020-07-30 DIAGNOSIS — F329 Major depressive disorder, single episode, unspecified: Secondary | ICD-10-CM | POA: Diagnosis present

## 2020-07-30 DIAGNOSIS — N1832 Chronic kidney disease, stage 3b: Secondary | ICD-10-CM | POA: Diagnosis present

## 2020-07-30 DIAGNOSIS — I4892 Unspecified atrial flutter: Secondary | ICD-10-CM | POA: Diagnosis present

## 2020-07-30 DIAGNOSIS — C8330 Diffuse large B-cell lymphoma, unspecified site: Secondary | ICD-10-CM | POA: Diagnosis present

## 2020-07-30 DIAGNOSIS — E440 Moderate protein-calorie malnutrition: Secondary | ICD-10-CM | POA: Diagnosis not present

## 2020-07-30 DIAGNOSIS — E43 Unspecified severe protein-calorie malnutrition: Principal | ICD-10-CM | POA: Diagnosis present

## 2020-07-30 DIAGNOSIS — R627 Adult failure to thrive: Secondary | ICD-10-CM | POA: Diagnosis present

## 2020-07-30 DIAGNOSIS — I129 Hypertensive chronic kidney disease with stage 1 through stage 4 chronic kidney disease, or unspecified chronic kidney disease: Secondary | ICD-10-CM | POA: Diagnosis present

## 2020-07-30 DIAGNOSIS — I480 Paroxysmal atrial fibrillation: Secondary | ICD-10-CM | POA: Diagnosis present

## 2020-07-30 DIAGNOSIS — F419 Anxiety disorder, unspecified: Secondary | ICD-10-CM | POA: Diagnosis present

## 2020-07-30 DIAGNOSIS — K219 Gastro-esophageal reflux disease without esophagitis: Secondary | ICD-10-CM | POA: Diagnosis present

## 2020-07-30 DIAGNOSIS — E785 Hyperlipidemia, unspecified: Secondary | ICD-10-CM | POA: Diagnosis present

## 2020-07-30 DIAGNOSIS — G250 Essential tremor: Secondary | ICD-10-CM | POA: Diagnosis present

## 2020-07-30 DIAGNOSIS — R000 Tachycardia, unspecified: Secondary | ICD-10-CM | POA: Diagnosis present

## 2020-07-30 DIAGNOSIS — Z881 Allergy status to other antibiotic agents status: Secondary | ICD-10-CM

## 2020-07-30 DIAGNOSIS — C8336 Diffuse large B-cell lymphoma, intrapelvic lymph nodes: Secondary | ICD-10-CM | POA: Diagnosis present

## 2020-07-30 DIAGNOSIS — E1159 Type 2 diabetes mellitus with other circulatory complications: Secondary | ICD-10-CM | POA: Diagnosis not present

## 2020-07-30 DIAGNOSIS — E1142 Type 2 diabetes mellitus with diabetic polyneuropathy: Secondary | ICD-10-CM | POA: Diagnosis present

## 2020-07-30 DIAGNOSIS — Z86711 Personal history of pulmonary embolism: Secondary | ICD-10-CM

## 2020-07-30 DIAGNOSIS — Z79899 Other long term (current) drug therapy: Secondary | ICD-10-CM

## 2020-07-30 DIAGNOSIS — R296 Repeated falls: Secondary | ICD-10-CM | POA: Diagnosis present

## 2020-07-30 DIAGNOSIS — C8586 Other specified types of non-Hodgkin lymphoma, intrapelvic lymph nodes: Secondary | ICD-10-CM | POA: Diagnosis present

## 2020-07-30 DIAGNOSIS — I10 Essential (primary) hypertension: Secondary | ICD-10-CM | POA: Diagnosis not present

## 2020-07-30 DIAGNOSIS — E1122 Type 2 diabetes mellitus with diabetic chronic kidney disease: Secondary | ICD-10-CM | POA: Diagnosis present

## 2020-07-30 DIAGNOSIS — M19011 Primary osteoarthritis, right shoulder: Secondary | ICD-10-CM | POA: Diagnosis present

## 2020-07-30 DIAGNOSIS — C8338 Diffuse large B-cell lymphoma, lymph nodes of multiple sites: Secondary | ICD-10-CM | POA: Diagnosis not present

## 2020-07-30 DIAGNOSIS — G8929 Other chronic pain: Secondary | ICD-10-CM

## 2020-07-30 DIAGNOSIS — N390 Urinary tract infection, site not specified: Secondary | ICD-10-CM | POA: Diagnosis present

## 2020-07-30 DIAGNOSIS — R946 Abnormal results of thyroid function studies: Secondary | ICD-10-CM | POA: Diagnosis present

## 2020-07-30 DIAGNOSIS — R338 Other retention of urine: Secondary | ICD-10-CM | POA: Diagnosis present

## 2020-07-30 DIAGNOSIS — M549 Dorsalgia, unspecified: Secondary | ICD-10-CM | POA: Diagnosis present

## 2020-07-30 DIAGNOSIS — R531 Weakness: Secondary | ICD-10-CM | POA: Diagnosis present

## 2020-07-30 DIAGNOSIS — Z8249 Family history of ischemic heart disease and other diseases of the circulatory system: Secondary | ICD-10-CM

## 2020-07-30 DIAGNOSIS — G893 Neoplasm related pain (acute) (chronic): Secondary | ICD-10-CM | POA: Diagnosis not present

## 2020-07-30 DIAGNOSIS — Z809 Family history of malignant neoplasm, unspecified: Secondary | ICD-10-CM

## 2020-07-30 DIAGNOSIS — I152 Hypertension secondary to endocrine disorders: Secondary | ICD-10-CM | POA: Diagnosis present

## 2020-07-30 DIAGNOSIS — Z8711 Personal history of peptic ulcer disease: Secondary | ICD-10-CM

## 2020-07-30 DIAGNOSIS — Z87891 Personal history of nicotine dependence: Secondary | ICD-10-CM

## 2020-07-30 DIAGNOSIS — G43909 Migraine, unspecified, not intractable, without status migrainosus: Secondary | ICD-10-CM | POA: Diagnosis present

## 2020-07-30 DIAGNOSIS — T451X5A Adverse effect of antineoplastic and immunosuppressive drugs, initial encounter: Secondary | ICD-10-CM | POA: Diagnosis present

## 2020-07-30 LAB — RESP PANEL BY RT PCR (RSV, FLU A&B, COVID)
Influenza A by PCR: NEGATIVE
Influenza B by PCR: NEGATIVE
Respiratory Syncytial Virus by PCR: NEGATIVE
SARS Coronavirus 2 by RT PCR: NEGATIVE

## 2020-07-30 LAB — CBC WITH DIFFERENTIAL/PLATELET
Abs Immature Granulocytes: 0.05 10*3/uL (ref 0.00–0.07)
Basophils Absolute: 0 10*3/uL (ref 0.0–0.1)
Basophils Relative: 0 %
Eosinophils Absolute: 0 10*3/uL (ref 0.0–0.5)
Eosinophils Relative: 0 %
HCT: 28.9 % — ABNORMAL LOW (ref 39.0–52.0)
Hemoglobin: 8.9 g/dL — ABNORMAL LOW (ref 13.0–17.0)
Immature Granulocytes: 1 %
Lymphocytes Relative: 12 %
Lymphs Abs: 0.8 10*3/uL (ref 0.7–4.0)
MCH: 32.1 pg (ref 26.0–34.0)
MCHC: 30.8 g/dL (ref 30.0–36.0)
MCV: 104.3 fL — ABNORMAL HIGH (ref 80.0–100.0)
Monocytes Absolute: 0.9 10*3/uL (ref 0.1–1.0)
Monocytes Relative: 13 %
Neutro Abs: 5.2 10*3/uL (ref 1.7–7.7)
Neutrophils Relative %: 74 %
Platelets: 276 10*3/uL (ref 150–400)
RBC: 2.77 MIL/uL — ABNORMAL LOW (ref 4.22–5.81)
RDW: 14.3 % (ref 11.5–15.5)
WBC: 7 10*3/uL (ref 4.0–10.5)
nRBC: 0 % (ref 0.0–0.2)

## 2020-07-30 LAB — BASIC METABOLIC PANEL
Anion gap: 10 (ref 5–15)
BUN: 25 mg/dL — ABNORMAL HIGH (ref 8–23)
CO2: 23 mmol/L (ref 22–32)
Calcium: 9.7 mg/dL (ref 8.9–10.3)
Chloride: 101 mmol/L (ref 98–111)
Creatinine, Ser: 1.78 mg/dL — ABNORMAL HIGH (ref 0.61–1.24)
GFR calc Af Amer: 42 mL/min — ABNORMAL LOW (ref >60)
GFR calc non Af Amer: 36 mL/min — ABNORMAL LOW (ref >60)
Glucose, Bld: 104 mg/dL — ABNORMAL HIGH (ref 70–99)
Potassium: 3.8 mmol/L (ref 3.5–5.1)
Sodium: 134 mmol/L — ABNORMAL LOW (ref 135–145)

## 2020-07-30 LAB — URINALYSIS, ROUTINE W REFLEX MICROSCOPIC
Bilirubin Urine: NEGATIVE
Glucose, UA: 150 mg/dL — AB
Hgb urine dipstick: NEGATIVE
Ketones, ur: 20 mg/dL — AB
Nitrite: NEGATIVE
Protein, ur: 100 mg/dL — AB
Specific Gravity, Urine: 1.016 (ref 1.005–1.030)
pH: 8 (ref 5.0–8.0)

## 2020-07-30 MED ORDER — METFORMIN HCL 500 MG PO TABS
500.0000 mg | ORAL_TABLET | Freq: Every day | ORAL | Status: DC
  Administered 2020-07-31: 500 mg via ORAL
  Filled 2020-07-30: qty 1

## 2020-07-30 MED ORDER — SODIUM CHLORIDE 0.9 % IV BOLUS
500.0000 mL | Freq: Once | INTRAVENOUS | Status: AC
  Administered 2020-07-30: 500 mL via INTRAVENOUS

## 2020-07-30 MED ORDER — DAPAGLIFLOZIN PROPANEDIOL 10 MG PO TABS: 10.0000 mg | ORAL_TABLET | Freq: Two times a day (BID) | ORAL | Status: DC

## 2020-07-30 MED ORDER — DAPAGLIFLOZIN PROPANEDIOL 10 MG PO TABS
10.0000 mg | ORAL_TABLET | Freq: Every day | ORAL | Status: DC
  Administered 2020-07-31 – 2020-08-02 (×3): 10 mg via ORAL
  Filled 2020-07-30 (×5): qty 1

## 2020-07-30 MED ORDER — ONDANSETRON HCL 4 MG/2ML IJ SOLN: 4.0000 mg | Freq: Four times a day (QID) | INTRAMUSCULAR | Status: DC | PRN

## 2020-07-30 MED ORDER — PRIMIDONE 50 MG PO TABS
100.0000 mg | ORAL_TABLET | Freq: Two times a day (BID) | ORAL | Status: DC
  Administered 2020-07-30 – 2020-08-03 (×8): 100 mg via ORAL
  Filled 2020-07-30 (×9): qty 2

## 2020-07-30 MED ORDER — TOPIRAMATE 100 MG PO TABS
200.0000 mg | ORAL_TABLET | Freq: Every day | ORAL | Status: DC
  Administered 2020-07-30 – 2020-08-02 (×4): 200 mg via ORAL
  Filled 2020-07-30 (×4): qty 2

## 2020-07-30 MED ORDER — CYCLOBENZAPRINE HCL 10 MG PO TABS
10.0000 mg | ORAL_TABLET | Freq: Every day | ORAL | Status: DC
  Administered 2020-07-30 – 2020-08-02 (×4): 10 mg via ORAL
  Filled 2020-07-30 (×4): qty 1

## 2020-07-30 MED ORDER — ENSURE ENLIVE PO LIQD
237.0000 mL | Freq: Two times a day (BID) | ORAL | Status: DC
  Administered 2020-07-31 – 2020-08-01 (×3): 237 mL via ORAL

## 2020-07-30 MED ORDER — APIXABAN 5 MG PO TABS
5.0000 mg | ORAL_TABLET | Freq: Two times a day (BID) | ORAL | Status: DC
  Administered 2020-07-30 – 2020-08-03 (×8): 5 mg via ORAL
  Filled 2020-07-30 (×8): qty 1

## 2020-07-30 MED ORDER — TEMAZEPAM 15 MG PO CAPS
15.0000 mg | ORAL_CAPSULE | Freq: Every day | ORAL | Status: DC
  Administered 2020-07-30 – 2020-08-02 (×4): 15 mg via ORAL
  Filled 2020-07-30 (×4): qty 1

## 2020-07-30 MED ORDER — GABAPENTIN 300 MG PO CAPS
600.0000 mg | ORAL_CAPSULE | Freq: Three times a day (TID) | ORAL | Status: DC
  Administered 2020-07-30 – 2020-08-03 (×11): 600 mg via ORAL
  Filled 2020-07-30 (×11): qty 2

## 2020-07-30 MED ORDER — MIRTAZAPINE 15 MG PO TABS
30.0000 mg | ORAL_TABLET | Freq: Every day | ORAL | Status: DC
  Administered 2020-07-30 – 2020-08-02 (×4): 30 mg via ORAL
  Filled 2020-07-30 (×5): qty 2

## 2020-07-30 MED ORDER — ONDANSETRON HCL 4 MG PO TABS: 4.0000 mg | ORAL_TABLET | Freq: Four times a day (QID) | ORAL | Status: DC | PRN

## 2020-07-30 MED ORDER — PROPRANOLOL HCL 20 MG PO TABS
40.0000 mg | ORAL_TABLET | Freq: Two times a day (BID) | ORAL | Status: DC
  Administered 2020-07-30 – 2020-08-02 (×6): 40 mg via ORAL
  Filled 2020-07-30 (×6): qty 2

## 2020-07-30 MED ORDER — HYDROCODONE-ACETAMINOPHEN 10-325 MG PO TABS
1.0000 | ORAL_TABLET | Freq: Four times a day (QID) | ORAL | Status: DC | PRN
  Administered 2020-08-01 – 2020-08-03 (×2): 1 via ORAL
  Filled 2020-07-30 (×2): qty 1

## 2020-07-30 NOTE — Telephone Encounter (Signed)
[facility] CSW Progress Notes  Call from daughter, [name].  She expresses concerns about decision to place patient at [facility].  She has has patient discharged from [facility], and is currently at [facility] ED.  Daughter plans to take him home w her - she would like to talk with the "patient rights team."  CSW will notify [facility] staff of daughter's request - per daughter, she has made this request of the ED staff and was told they do not know where to direct daughter.  CSW will also attempt to contact [facility] to determine where to refer daughter.  Edwyna Shell, LCSW Clinical Social Worker Phone:  [phone]

## 2020-07-30 NOTE — ED Triage Notes (Signed)
Pt arrives via EMS. Reports that patient has revoked his DNR and is requesting treatment for his B Cell Lymphoma.  Reports chronic back and left leg pain.  Has a dose of home Oxycodone at 0915.

## 2020-07-30 NOTE — ED Notes (Addendum)
Pt rolled and placed on a bedpan at this time. Pt educated to press call light for staff when assistance is needed. Pt verbalized understanding at this time.   3:00 PM Pt rolled and changed into clean sheets, chuck pads, placed in a new brief and repositioned in bed at this time. Family at bedside. Bed is locked in the lowest position, side rails x2, call bell within reach and continuous cardiac monitor in place at this time. Provided with a cup of ice water at this time.

## 2020-07-30 NOTE — Progress Notes (Signed)
Received notification that the patient's daughter had questions. I have attempted to contact the patient's daughter, [name] this morning. Unable to reach Tammy at this time but I have left a VM with my contact information requesting a call back.

## 2020-07-30 NOTE — ED Notes (Signed)
Introduced self to patient and explained role in plan of care. Pt on continuous cardiac monitor and pulse oximetry at this time. Bed is locked in the lowest position, side rails x2, call bell within reach. Pt provided a warm blanket per request. All questions and concerns voiced addressed by this RN at this time. Educated on hourly rounding and is in agreement. Will continue to monitor.

## 2020-07-30 NOTE — H&P (Signed)
History and Physical  Daniel Richards:[id number] DOB: 01-11-1946 DOA: 07/30/2020  Referring physician: Landis Martins, ED provider PCP: [name], FNP  Outpatient Specialists:  Dr Tera Helper, Oncology  Patient Coming From: home  Chief Complaint: Weakness, pain  HPI: Daniel Richards is a 74 y.o. male with a history of B-cell lymphoma with hypercalcemia, diabetes mellitus with neuropathy, depression, history of PE, hypertension, GERD.  Patient was recently hospitalized for malignant hypercalcemia, leg pains, multiple falls.  Patient was worked up, given zoledronic acid, IV hydration with improvement of his calcium levels.  The patient and his family's were talked to about doing residential hospice.  Per physician notes, the patient and his family agreed and the patient was discharged to home to start hospice care the following day.  Unfortunately, there was some miscommunication and the family did not understand completely what hospice entailed.  The family talked with Dr. Delton Coombes, who have the family come to the hospital for evaluation due to his continued weakness and pain with a goal of getting to skilled nursing facility.  Emergency Department Course: Labs were relatively unremarkable with improved calcium levels and stable blood counts.  Review of Systems:   Pt denies any fevers, chills, nausea, vomiting, diarrhea, constipation, abdominal pain, shortness of breath, dyspnea on exertion, orthopnea, cough, wheezing, palpitations, headache, vision changes, lightheadedness, dizziness, melena, rectal bleeding.  Review of systems are otherwise negative  Past Medical History:  Diagnosis Date  . Acid reflux   . Anxiety and depression   . Cancer ([place])    lymphoma  . Depression   . Diabetes ([place])   . Diabetes mellitus 06/09/2011   pt taking metformin  . DJD of shoulder    right   . Hyperlipidemia   . Hypertension   . Lymphoma ([place])   . Migraines   . Port-A-Cath in  place 08/13/2019  . Pulmonary embolism ([place])   . Stomach ulcer   . Tremor   . Tremor   . Vitamin D deficiency    Past Surgical History:  Procedure Laterality Date  . BACK SURGERY    . KNEE SURGERY    . PORTACATH PLACEMENT Left 08/08/2019   Procedure: INSERTION PORT-A-CATH;  Surgeon: Aviva Signs, MD;  Location: [facility];  Service: General;  Laterality: Left;  . SHOULDER SURGERY     Social History:  reports that he quit smoking about 30 years ago. His smoking use included cigarettes. He has a 40.00 pack-year smoking history. He has never used smokeless tobacco. He reports that he does not drink alcohol and does not use drugs. Patient lives at home  Allergies  Allergen Reactions  . Vancomycin Hives    Patient received Benadryl to treat the hives    Family History  Problem Relation Age of Onset  . Cancer Sister   . Cancer Sister   . Cancer Brother   . Heart attack Father   . Cancer Brother   . Heart attack Brother   . Healthy Son   . Healthy Daughter   . Migraines Neg Hx       Prior to Admission medications   Medication Sig Start Date End Date Taking? Authorizing Provider  primidone (MYSOLINE) 50 MG tablet Take 1.5 tablets (75 mg total) by mouth every 8 (eight) hours. Patient taking differently: Take 100 mg by mouth 2 (two) times daily.  08/06/19   Gerlene Fee, NP  temazepam (RESTORIL) 15 MG capsule Take 1 capsule (15 mg total) by mouth at  bedtime. 10/07/19   Barton Dubois, MD    Physical Exam: BP 136/61 (BP Location: Left Arm)   Pulse 64   Temp 98.6 F (37 C) (Oral)   Resp 15   Ht 6' (1.829 m)   Wt 80 kg   SpO2 96%   BMI 23.92 kg/m   . General: Elderly male. Awake and alert and oriented x3. No acute cardiopulmonary distress.  Marland Kitchen HEENT: Normocephalic atraumatic.  Right and left ears normal in appearance.  Pupils equal, round, reactive to light. Extraocular muscles are intact. Sclerae anicteric and noninjected.  Moist mucosal membranes. No mucosal lesions.   . Neck: Neck supple without lymphadenopathy. No carotid bruits. No masses palpated.  . Cardiovascular: Regular rate with normal S1-S2 sounds. No murmurs, rubs, gallops auscultated. No JVD.  Marland Kitchen Respiratory: Good respiratory effort with no wheezes, rales, rhonchi. Lungs clear to auscultation bilaterally.  No accessory muscle use. . Abdomen: Soft, nontender, nondistended. Active bowel sounds. No masses or hepatosplenomegaly  . Skin: No rashes, lesions, or ulcerations.  Dry, warm to touch. 2+ dorsalis pedis and radial pulses. . Musculoskeletal: Pain in the left leg.  All major joints not erythematous nontender.  No upper or lower joint deformation.  Good ROM.  No contractures  . Psychiatric: Intact judgment and insight. Pleasant and cooperative. . Neurologic: No focal neurological deficits. Strength is 5/5 and symmetric in upper and lower extremities.  Cranial nerves II through XII are grossly intact.           Labs on Admission: I have personally reviewed following labs and imaging studies  CBC: Recent Labs  Lab 07/27/20 0841 07/28/20 0339 07/30/20 1310  WBC 5.8 4.5 7.0  NEUTROABS 3.9  --  5.2  HGB 9.5* 8.2* 8.9*  HCT 30.3* 26.9* 28.9*  MCV 103.4* 103.1* 104.3*  PLT 374 342 616   Basic Metabolic Panel: Recent Labs  Lab 07/27/20 0841 07/28/20 0339 07/30/20 1310  NA 136 137 134*  K 4.6 4.5 3.8  CL 101 103 101  CO2 26 25 23   GLUCOSE 140* 94 104*  BUN 29* 34* 25*  CREATININE 1.74* 1.83* 1.78*  CALCIUM 11.7* 11.2* 9.7  MG 2.0  --   --    GFR: Estimated Creatinine Clearance: 38.8 mL/min (A) (by C-G formula based on SCr of 1.78 mg/dL (H)). Liver Function Tests: Recent Labs  Lab 07/27/20 0841 07/28/20 0339  AST 16 12*  ALT 14 13  ALKPHOS 111 93  BILITOT 0.4 0.3  PROT 7.6 6.6  ALBUMIN 3.1* 2.9*   No results for input(s): LIPASE, AMYLASE in the last 168 hours. No results for input(s): AMMONIA in the last 168 hours. Coagulation Profile: No results for input(s): INR,  PROTIME in the last 168 hours. Cardiac Enzymes: No results for input(s): CKTOTAL, CKMB, CKMBINDEX, TROPONINI in the last 168 hours. BNP (last 3 results) No results for input(s): PROBNP in the last 8760 hours. HbA1C: No results for input(s): HGBA1C in the last 72 hours. CBG: Recent Labs  Lab 07/28/20 1129 07/28/20 1603 07/28/20 2143 07/29/20 0736 07/29/20 1117  GLUCAP 126* 129* 93 110* 122*   Lipid Profile: No results for input(s): CHOL, HDL, LDLCALC, TRIG, CHOLHDL, LDLDIRECT in the last 72 hours. Thyroid Function Tests: Recent Labs    07/28/20 1206  TSH 10.955*  FREET4 0.85   Anemia Panel: Recent Labs    07/28/20 1206  VITAMINB12 173*  FOLATE 14.7   Urine analysis:    Component Value Date/Time   COLORURINE AMBER (A) 07/30/2020  1359   APPEARANCEUR CLOUDY (A) 07/30/2020 1359   LABSPEC 1.016 07/30/2020 1359   PHURINE 8.0 07/30/2020 1359   GLUCOSEU 150 (A) 07/30/2020 1359   HGBUR NEGATIVE 07/30/2020 1359   BILIRUBINUR NEGATIVE 07/30/2020 1359   KETONESUR 20 (A) 07/30/2020 1359   PROTEINUR 100 (A) 07/30/2020 1359   UROBILINOGEN 1.0 11/18/2014 1238   NITRITE NEGATIVE 07/30/2020 1359   LEUKOCYTESUR LARGE (A) 07/30/2020 1359   Sepsis Labs: @LABRCNTIP (procalcitonin:4,lacticidven:4) ) Recent Results (from the past 240 hour(s))  SARS Coronavirus 2 by RT PCR (hospital order, performed in [place] hospital lab)     Status: None   Collection Time: 07/27/20  3:24 PM  Result Value Ref Range Status   SARS Coronavirus 2 NEGATIVE NEGATIVE Final    Comment: (NOTE) SARS-CoV-2 target nucleic acids are NOT DETECTED.  The SARS-CoV-2 RNA is generally detectable in upper and lower respiratory specimens during the acute phase of infection. The lowest concentration of SARS-CoV-2 viral copies this assay can detect is 250 copies / mL. A negative result does not preclude SARS-CoV-2 infection and should not be used as the sole basis for treatment or other patient management  decisions.  A negative result may occur with improper specimen collection / handling, submission of specimen other than nasopharyngeal swab, presence of viral mutation(s) within the areas targeted by this assay, and inadequate number of viral copies (<250 copies / mL). A negative result must be combined with clinical observations, patient history, and epidemiological information.  Fact Sheet for Patients:   StrictlyIdeas.no  Fact Sheet for Healthcare Providers: BankingDealers.co.za  This test is not yet approved or  cleared by the [place] FDA and has been authorized for detection and/or diagnosis of SARS-CoV-2 by FDA under an Emergency Use Authorization (EUA).  This EUA will remain in effect (meaning this test can be used) for the duration of the COVID-19 declaration under Section 564(b)(1) of the Act, 21 U.S.C. section 360bbb-3(b)(1), unless the authorization is terminated or revoked sooner.  Performed at [facility], [address], [place], [place] [address]   MRSA PCR Screening     Status: None   Collection Time: 07/27/20  3:49 PM   Specimen: Nasopharyngeal Swab  Result Value Ref Range Status   MRSA by PCR NEGATIVE NEGATIVE Final    Comment:        The GeneXpert MRSA Assay (FDA approved for NASAL specimens only), is one component of a comprehensive MRSA colonization surveillance program. It is not intended to diagnose MRSA infection nor to guide or monitor treatment for MRSA infections. Performed at [facility], [address] [place], [place] [address]   Culture, Urine     Status: Abnormal   Collection Time: 07/28/20 12:02 PM   Specimen: Urine, Random  Result Value Ref Range Status   Specimen Description   Final    URINE, RANDOM Performed at [facility], [address] [place], [place] [address]    Special Requests   Final    NONE Performed at [facility], [address] [place], [place] [address]     Culture MULTIPLE SPECIES PRESENT, SUGGEST RECOLLECTION (A)  Final   Report Status 07/29/2020 FINAL  Final  Respiratory Panel by RT PCR (Flu A&B, Covid) - Nasopharyngeal Swab     Status: None   Collection Time: 07/29/20 12:44 PM   Specimen: Nasopharyngeal Swab  Result Value Ref Range Status   SARS Coronavirus 2 by RT PCR NEGATIVE NEGATIVE Final    Comment: (NOTE) SARS-CoV-2 target nucleic acids  are NOT DETECTED.  The SARS-CoV-2 RNA is generally detectable in upper respiratoy specimens during the acute phase of infection. The lowest concentration of SARS-CoV-2 viral copies this assay can detect is 131 copies/mL. A negative result does not preclude SARS-Cov-2 infection and should not be used as the sole basis for treatment or other patient management decisions. A negative result may occur with  improper specimen collection/handling, submission of specimen other than nasopharyngeal swab, presence of viral mutation(s) within the areas targeted by this assay, and inadequate number of viral copies (<131 copies/mL). A negative result must be combined with clinical observations, patient history, and epidemiological information. The expected result is Negative.  Fact Sheet for Patients:  PinkCheek.be  Fact Sheet for Healthcare Providers:  GravelBags.it  This test is no t yet approved or cleared by the [place] FDA and  has been authorized for detection and/or diagnosis of SARS-CoV-2 by FDA under an Emergency Use Authorization (EUA). This EUA will remain  in effect (meaning this test can be used) for the duration of the COVID-19 declaration under Section 564(b)(1) of the Act, 21 U.S.C. section 360bbb-3(b)(1), unless the authorization is terminated or revoked sooner.     Influenza A by PCR NEGATIVE NEGATIVE Final   Influenza B by PCR NEGATIVE NEGATIVE Final    Comment: (NOTE) The Xpert Xpress SARS-CoV-2/FLU/RSV assay is  intended as an aid in  the diagnosis of influenza from Nasopharyngeal swab specimens and  should not be used as a sole basis for treatment. Nasal washings and  aspirates are unacceptable for Xpert Xpress SARS-CoV-2/FLU/RSV  testing.  Fact Sheet for Patients: PinkCheek.be  Fact Sheet for Healthcare Providers: GravelBags.it  This test is not yet approved or cleared by the [place] FDA and  has been authorized for detection and/or diagnosis of SARS-CoV-2 by  FDA under an Emergency Use Authorization (EUA). This EUA will remain  in effect (meaning this test can be used) for the duration of the  Covid-19 declaration under Section 564(b)(1) of the Act, 21  U.S.C. section 360bbb-3(b)(1), unless the authorization is  terminated or revoked. Performed at [facility], [address], [place], [place] [address]   Resp Panel by RT PCR (RSV, Flu A&B, Covid) - Nasopharyngeal Swab     Status: None   Collection Time: 07/30/20  2:41 PM   Specimen: Nasopharyngeal Swab  Result Value Ref Range Status   SARS Coronavirus 2 by RT PCR NEGATIVE NEGATIVE Final    Comment: (NOTE) SARS-CoV-2 target nucleic acids are NOT DETECTED.  The SARS-CoV-2 RNA is generally detectable in upper respiratoy specimens during the acute phase of infection. The lowest concentration of SARS-CoV-2 viral copies this assay can detect is 131 copies/mL. A negative result does not preclude SARS-Cov-2 infection and should not be used as the sole basis for treatment or other patient management decisions. A negative result may occur with  improper specimen collection/handling, submission of specimen other than nasopharyngeal swab, presence of viral mutation(s) within the areas targeted by this assay, and inadequate number of viral copies (<131 copies/mL). A negative result must be combined with clinical observations, patient history, and epidemiological information.  The expected result is Negative.  Fact Sheet for Patients:  PinkCheek.be  Fact Sheet for Healthcare Providers:  GravelBags.it  This test is no t yet approved or cleared by the [place] FDA and  has been authorized for detection and/or diagnosis of SARS-CoV-2 by FDA under an Emergency Use Authorization (EUA). This EUA will remain  in effect (meaning this  test can be used) for the duration of the COVID-19 declaration under Section 564(b)(1) of the Act, 21 U.S.C. section 360bbb-3(b)(1), unless the authorization is terminated or revoked sooner.     Influenza A by PCR NEGATIVE NEGATIVE Final   Influenza B by PCR NEGATIVE NEGATIVE Final    Comment: (NOTE) The Xpert Xpress SARS-CoV-2/FLU/RSV assay is intended as an aid in  the diagnosis of influenza from Nasopharyngeal swab specimens and  should not be used as a sole basis for treatment. Nasal washings and  aspirates are unacceptable for Xpert Xpress SARS-CoV-2/FLU/RSV  testing.  Fact Sheet for Patients: PinkCheek.be  Fact Sheet for Healthcare Providers: GravelBags.it  This test is not yet approved or cleared by the [place] FDA and  has been authorized for detection and/or diagnosis of SARS-CoV-2 by  FDA under an Emergency Use Authorization (EUA). This EUA will remain  in effect (meaning this test can be used) for the duration of the  Covid-19 declaration under Section 564(b)(1) of the Act, 21  U.S.C. section 360bbb-3(b)(1), unless the authorization is  terminated or revoked.    Respiratory Syncytial Virus by PCR NEGATIVE NEGATIVE Final    Comment: (NOTE) Fact Sheet for Patients: PinkCheek.be  Fact Sheet for Healthcare Providers: GravelBags.it  This test is not yet approved or cleared by the [place] FDA and  has been authorized for  detection and/or diagnosis of SARS-CoV-2 by  FDA under an Emergency Use Authorization (EUA). This EUA will remain  in effect (meaning this test can be used) for the duration of the  COVID-19 declaration under Section 564(b)(1) of the Act, 21 U.S.C.  section 360bbb-3(b)(1), unless the authorization is terminated or  revoked. Performed at [facility], [address] [place], [place] [address]      Radiological Exams on Admission: No results found.   Assessment/Plan: Principal Problem:   Failure to thrive in adult Active Problems:   Essential tremor   Generalized weakness   Malnutrition of moderate degree   Diabetic peripheral neuropathy ([place])   Hypertension associated with type 2 diabetes mellitus (H[facility])   Diffuse large B cell lymphoma (HCC)   Other specified types of non-hodgkin lymphoma, intrapelvic lymph nodes (HCC)   Chronic pain    This patient was discussed with the ED physician, including pertinent vitals, physical exam findings, labs, and imaging.  We also discussed care given by the ED provider.  1. Failure to thrive in adult a. Likely secondary to malnutrition b. We will replace Celexa with Remeron in hopes that this will stimulate appetite c. Antiemetics d. Ensure between meals 2. Generalized weakness a. Secondary to chemotherapy, malignancy, deconditioning b. PT/OT consult c. Transition of care team consult. 3. Chronic pain a. Restart pain regimen 4. B-cell lymphoma with malignant hypercalcemia a. Malignant hypercalcemia is improved b. Dr. Delton Coombes to discuss continuing treatment with patient 5. Diabetes a. Continue home regimen 6. Hypertension a. Continue home regimen 7. Essential tremor a. Restart antiepileptics for tremor control  DVT prophylaxis: On Eliquis Consultants: None Code Status: Limited code: No intubation, but permissible for CPR, medications, defibrillation Family Communication: Son present during exam and interview Disposition Plan:  Pending placement   Truett Mainland, DO

## 2020-07-30 NOTE — ED Provider Notes (Signed)
[facility] EMERGENCY DEPARTMENT Provider Note   CSN: [id number] Arrival date & time: 07/30/20  1234     History Chief Complaint  Patient presents with  . Back Pain  . Leg Pain    Daniel Richards is a 74 y.o. male with a history of advanced lymphoma under the care of Dr Delton Coombes, discharged yesterday from the hospitalist service at which time he and family decided to switch to hospice care. He returns today stating wants full treatment for his lymphoma.  When reminded about yesterday's decision, he denies any recollection of this conversation or making this decision.  He currently resided with his daughter who is his POA, pt stating his son called EMS for transportation here.  He states he generally "does not feel well", with no specific complaint except for fatigue, chronic back and left leg pain.  He was given a dose of oxycodone at 915 prir to arrival today.   Upon conversation with pt's dg, Tammy Vinson, pt's POA, she states there was some confusion with their decision to withdraw cancer treatment care.  She called Dr. Delton Coombes this am and was advised to have pt present here for further evaluation.  She confirms that they do want to continue with his chemotherapy for his lymphoma.   HPI     Past Medical History:  Diagnosis Date  . Acid reflux   . Anxiety and depression   . Cancer ([place])    lymphoma  . Depression   . Diabetes ([place])   . Diabetes mellitus 06/09/2011   pt taking metformin  . DJD of shoulder    right   . Hyperlipidemia   . Hypertension   . Lymphoma ([place])   . Migraines   . Port-A-Cath in place 08/13/2019  . Pulmonary embolism ([place])   . Stomach ulcer   . Tremor   . Tremor   . Vitamin D deficiency     Patient Active Problem List   Diagnosis Date Noted  . Cancer related pain   . Chronic indwelling Foley catheter 07/27/2020  . Fall at home, initial encounter 07/27/2020  . Left leg pain 07/27/2020  . Chronic low back pain 07/27/2020  . Other specified  types of non-hodgkin lymphoma, intrapelvic lymph nodes ([place]) 05/14/2020  . Incomplete emptying of bladder 01/22/2020  . Sepsis ([place]) 01/15/2020  . Hypokalemia   . Acute cystitis without hematuria   . Epiploic appendagitis 09/27/2019  . Acute saddle pulmonary embolism with acute cor pulmonale (HCC)   . Urinary tract infection associated with indwelling urethral catheter ([place])   . Febrile neutropenia ([place]) 08/26/2019  . PE (pulmonary thromboembolism) ([place]) 08/26/2019  . Neutropenia, drug-induced ([place]) 08/20/2019  . 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 Port-A-Cath in place 08/13/2019  . Anemia of chronic disease 08/07/2019  . Major depression, recurrent, chronic ([place]) 08/07/2019  . BPH without urinary obstruction 08/07/2019  . Aortic atherosclerosis ([place]) 08/07/2019  . Diabetic peripheral neuropathy ([place]) 08/07/2019  . Hypertension associated with type 2 diabetes mellitus ([place]) 08/07/2019  . 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 Odynophagia   . Acute kidney injury ([place])   . Falls frequently   . Generalized abdominal pain   . Terminal care   . Palliative care by specialist   . Folate deficiency   . Generalized weakness 07/25/2019  . Intra-abdominal lymphadenopathy 07/25/2019  . Retroperitoneal lymphadenopathy 07/25/2019  . Malnutrition of moderate degree 07/25/2019  . Tremor   .  Hypercalcemia 07/24/2019  . B12 deficiency 11/18/2016  . Weakness generalized 11/16/2016  . Altered mental status, unspecified 11/16/2016  . Fracture, rib 11/16/2016  . Weakness 11/16/2016  . New onset of headaches after age 17 09/01/2015  . Morning headache 09/01/2015  . Daytime somnolence 09/01/2015  . Essential tremor 09/01/2015  . Intractable headache 09/01/2015  . Type 2 diabetes, controlled, with peripheral neuropathy ([place]) 06/09/2011    Past Surgical History:  Procedure Laterality Date  . BACK SURGERY      . KNEE SURGERY    . PORTACATH PLACEMENT Left 08/08/2019   Procedure: INSERTION PORT-A-CATH;  Surgeon: Aviva Signs, MD;  Location: [facility];  Service: General;  Laterality: Left;  . SHOULDER SURGERY         Family History  Problem Relation Age of Onset  . Cancer Sister   . Cancer Sister   . Cancer Brother   . Heart attack Father   . Cancer Brother   . Heart attack Brother   . Healthy Son   . Healthy Daughter   . Migraines Neg Hx     Social History   Tobacco Use  . Smoking status: Former Smoker    Packs/day: 2.00    Years: 20.00    Pack years: 40.00    Types: Cigarettes    Quit date: 11/17/1989    Years since quitting: 30.7  . Smokeless tobacco: Never Used  Vaping Use  . Vaping Use: Never used  Substance Use Topics  . Alcohol use: No  . Drug use: No    Home Medications Prior to Admission medications   Medication Sig Start Date End Date Taking? Authorizing Provider  primidone (MYSOLINE) 50 MG tablet Take 1.5 tablets (75 mg total) by mouth every 8 (eight) hours. Patient taking differently: Take 100 mg by mouth 2 (two) times daily.  08/06/19   Gerlene Fee, NP  temazepam (RESTORIL) 15 MG capsule Take 1 capsule (15 mg total) by mouth at bedtime. 10/07/19   Barton Dubois, MD    Allergies    Vancomycin  Review of Systems   Review of Systems  Constitutional: Positive for fatigue. Negative for fever.  HENT: Negative.   Eyes: Negative.   Respiratory: Negative for chest tightness and shortness of breath.   Cardiovascular: Negative for chest pain.  Gastrointestinal: Negative for abdominal pain, nausea and vomiting.  Genitourinary: Negative.   Musculoskeletal: Positive for arthralgias and back pain. Negative for joint swelling and neck pain.  Skin: Negative.  Negative for rash and wound.  Neurological: Negative for dizziness, weakness, light-headedness, numbness and headaches.  Psychiatric/Behavioral: Negative.     Physical Exam Updated Vital Signs BP 136/61  (BP Location: Left Arm)   Pulse 64   Temp 98.6 F (37 C) (Oral)   Resp 15   Ht 6' (1.829 m)   Wt 80 kg   SpO2 96%   BMI 23.92 kg/m   Physical Exam Vitals and nursing note reviewed.  Constitutional:      Appearance: He is well-developed.  HENT:     Head: Normocephalic and atraumatic.     Mouth/Throat:     Mouth: Mucous membranes are dry.  Eyes:     Conjunctiva/sclera: Conjunctivae normal.  Cardiovascular:     Rate and Rhythm: Regular rhythm. Tachycardia present.     Heart sounds: Normal heart sounds.  Pulmonary:     Effort: Pulmonary effort is normal.     Breath sounds: Normal breath sounds. No wheezing.  Abdominal:  General: Bowel sounds are normal.     Palpations: Abdomen is soft.     Tenderness: There is no abdominal tenderness.  Musculoskeletal:        General: Normal range of motion.     Cervical back: Normal range of motion.  Skin:    General: Skin is warm and dry.  Neurological:     General: No focal deficit present.     Mental Status: He is alert.     Comments: Appears drowsy, but responds appropriately.  Does not remember yesterdays conversation prior to dc, but is A&O x 3 at present.      ED Results / Procedures / Treatments   Labs (all labs ordered are listed, but only abnormal results are displayed) Labs Reviewed  CBC WITH DIFFERENTIAL/PLATELET - Abnormal; Notable for the following components:      Result Value   RBC 2.77 (*)    Hemoglobin 8.9 (*)    HCT 28.9 (*)    MCV 104.3 (*)    All other components within normal limits  BASIC METABOLIC PANEL - Abnormal; Notable for the following components:   Sodium 134 (*)    Glucose, Bld 104 (*)    BUN 25 (*)    Creatinine, Ser 1.78 (*)    GFR calc non Af Amer 36 (*)    GFR calc Af Amer 42 (*)    All other components within normal limits  URINALYSIS, ROUTINE W REFLEX MICROSCOPIC - Abnormal; Notable for the following components:   Color, Urine AMBER (*)    APPearance CLOUDY (*)    Glucose, UA 150  (*)    Ketones, ur 20 (*)    Protein, ur 100 (*)    Leukocytes,Ua LARGE (*)    Bacteria, UA RARE (*)    All other components within normal limits  RESP PANEL BY RT PCR (RSV, FLU A&B, COVID)    EKG None  Radiology No results found.  Procedures Procedures (including critical care time)  Medications Ordered in ED Medications  sodium chloride 0.9 % bolus 500 mL (0 mLs Intravenous Stopped 07/30/20 1448)  sodium chloride 0.9 % bolus 500 mL (500 mLs Intravenous New Bag/Given 07/30/20 1538)    ED Course  I have reviewed the triage vital signs and the nursing notes.  Pertinent labs & imaging results that were available during my care of the patient were reviewed by me and considered in my medical decision making (see chart for details).    MDM Rules/Calculators/A&P                          Call placed to Dr. Delton Coombes for further guidance regarding pt's work and ongoing care. Discussed pt with Dr. Delton Coombes who has been in contact with the dg. And will be pursuing ongoing chemo.  He was originally supposed to receive tx today, but will plan on Monday.  In the interim, pt is too weak for home, dg unable to care for him, he needs to come in with plan for at least temp nursing home placement for rehab.  Concern also for hypercalcemia, rechecked today and normal range.    Call placed to hospitalists for readmission with goal of rehab placement.  Discussed with Dr. Nehemiah Settle who will see pt in ed.    Final Clinical Impression(s) / ED Diagnoses Final diagnoses:  Weakness  Cancer related pain    Rx / DC Orders ED Discharge Orders    None  Evalee Jefferson, PA-C 07/30/20 1605    Noemi Chapel, MD 07/31/20 [phone]

## 2020-07-31 DIAGNOSIS — R627 Adult failure to thrive: Secondary | ICD-10-CM

## 2020-07-31 LAB — BASIC METABOLIC PANEL
Anion gap: 15 (ref 5–15)
BUN: 25 mg/dL — ABNORMAL HIGH (ref 8–23)
CO2: 21 mmol/L — ABNORMAL LOW (ref 22–32)
Calcium: 9.6 mg/dL (ref 8.9–10.3)
Chloride: 100 mmol/L (ref 98–111)
Creatinine, Ser: 1.76 mg/dL — ABNORMAL HIGH (ref 0.61–1.24)
GFR calc Af Amer: 43 mL/min — ABNORMAL LOW (ref >60)
GFR calc non Af Amer: 37 mL/min — ABNORMAL LOW (ref >60)
Glucose, Bld: 99 mg/dL (ref 70–99)
Potassium: 3.5 mmol/L (ref 3.5–5.1)
Sodium: 136 mmol/L (ref 135–145)

## 2020-07-31 MED ORDER — SODIUM CHLORIDE 0.9 % IV SOLN: INTRAVENOUS | Status: DC

## 2020-07-31 MED ORDER — CHLORHEXIDINE GLUCONATE CLOTH 2 % EX PADS
6.0000 | MEDICATED_PAD | Freq: Every day | CUTANEOUS | Status: DC
  Administered 2020-07-31 – 2020-08-03 (×4): 6 via TOPICAL

## 2020-07-31 MED ORDER — LEVOTHYROXINE SODIUM 50 MCG PO TABS
50.0000 ug | ORAL_TABLET | Freq: Every day | ORAL | Status: DC
  Administered 2020-08-01 – 2020-08-03 (×3): 50 ug via ORAL
  Filled 2020-07-31 (×3): qty 1

## 2020-07-31 NOTE — TOC Progression Note (Signed)
Transition of Care [facility]) - Progression Note    Patient Details  Name: Daniel Richards MRN: [id number] Date of Birth: [birth year]  [facility] CM/SW Contact  Natasha Bence, LCSW Phone Number: 07/31/2020, 5:22 PM  Clinical Narrative:    CSW contacted patient's spouse for bed referrals. Patient's spouse reported that she was working with her husband's doctor to get him into [facility] prior to admission and that she would like CSW to place a referral their. CSW inquired about agreeableness for other SNF referrals. Patient's spouse reported at the time, they were only agreeable to [facility], but would look into other SNF's and f/u with CSW. CSW completed FL2 and placed referral to [facility]. TOC to follow.         Expected Discharge Plan and Services                                                 Social Determinants of Health (SDOH) Interventions    Readmission Risk Interventions Readmission Risk Prevention Plan 01/18/2020 09/30/2019 07/30/2019  Post Dischage Appt - - Not Complete  Appt Comments - [facility] MD will follow while pt at short term rehab  Medication Screening - - -  Transportation Screening Complete Complete -  Medication Review Press photographer) Complete Complete -  PCP or Specialist appointment within 3-5 days of discharge - Not Complete -  [place] or Home Care Consult Complete Complete -  SW Recovery Care/Counseling Consult Complete Complete -  Palliative Care Screening Not Applicable Not Complete -  [place] Not Applicable Not Complete -  Some recent data might be hidden

## 2020-07-31 NOTE — Progress Notes (Addendum)
PROGRESS NOTE    Daniel Richards  NFA:[id number] DOB: Jul 20, 1946 DOA: 07/30/2020 PCP: [name], FNP   Brief Narrative:  Patient is a 74 year old male with history of B-cell lymphoma, hypercalcemia, diabetes with neuropathy, depression, history of PE, hypertension, GERD who was recently managed and was discharged from here for mild hypercalcemia, multiple falls to residential hospice of [facility].  Assessment & Plan:   Principal Problem:   Failure to thrive in adult Active Problems:   Essential tremor   Generalized weakness   Malnutrition of moderate degree   Diabetic peripheral neuropathy (HCC)   Hypertension associated with type 2 diabetes mellitus (HCC)   Diffuse large B cell lymphoma (HCC)   Other specified types of non-hodgkin lymphoma, intrapelvic lymph nodes (HCC)   Chronic pain   Failure to thrive: Due to poor oral intake, severe malnutrition.  Will request for nutrition consult.  This is secondary to his advanced malignancy.  Continue to encourage oral intake.  B-cell lymphoma: Follows with Dr. Delton Coombes.  S/P 6 cycles of chemotherapy.  PET scan on 07/19/2020 showed progressive lymphadenopathy, multifocal bone lesions.  I discussed with Dr. Delton Coombes this morning and he is planning to continue chemotherapy in the future.  He has a Chemo-Port on the left chest.  CKD stage IIIb: Baseline creatinine ranges from 1.6-1.9.  Currently kidney function at baseline.  Elevated TSH: Free T4 normal.  Will start on low-dose Synthyroid for this subclinical hypothyroidism with TSH of more than 10.  Hypercalcemia: Resolved  Back pain/left hip pain: Continue supportive care, pain management.  He has bone lesions from the lymphoma.  Urinary retention: He has chronic indwelling Foley catheter.  History of PE: Diagnosed with PE on 08/2019.  On Eliquis  Diabetes type 2: Controlled.  Last hemoglobin A1c of 6.3 as per 07/27/2020.On farxiga, Metformin.  He is on  gabapentin for diabetic neuropathy.  Essential tremor: Has tremor in hands.  On Mysoline and propanolol  Depression/anxiety: He was on  Celexa at home,now started on Remeron  Suspected UTI: Denies any dysuria.  He complains of abdomen pain which is chronic.  He has a Foley catheter.  Urinalysis showed leukocytes.  Urine culture pending.  Will hold on starting on antibiotics for now.  Goals of care: On his last admission, palliative care was consulted.  Goals of care were discussed with the daughter and he was discharged to hospice.  As per my discussion to Dr. Delton Coombes today.  He was not informed about this.  Daughter was not happy because he was transferred to hospice and he was brought back to the hospital.  Currently partial code.  Generalized weakness/falls: Physical therapy consulted.  Will consult social worker for skilled nursing facility placement.         DVT prophylaxis:Eliquis Code Status: partial Family Communication: Called and discussed with the daughter on phone on 07/31/20 Status is: Inpatient  Remains inpatient appropriate because:Unsafe d/c plan   Dispo: The patient is from: Residential hospice              Anticipated d/c is to: SNF              Anticipated d/c date is: 2 days              Patient currently is medically stable to d/c.      Consultants: None  Procedures:None Antimicrobials:  Anti-infectives (From admission, onward)   None      Subjective: Patient seen and examined at the bedside this  morning.  Hemodynamically stable during my evaluation.  Was not in any kind of distress.  Complains of generalized weakness, loss of appetite.  He had tremors on bilateral hands.   Objective: Vitals:   07/30/20 1830 07/30/20 2003 07/31/20 0017 07/31/20 0521  BP: 113/76 130/78 113/70 117/67  Pulse:  (!) 119 (!) 115 94  Resp: 13 15 16 18   Temp:  98.6 F (37 C) 99.2 F (37.3 C) 98.9 F (37.2 C)  TempSrc:  Oral Oral Oral  SpO2: 97% 98% 93% 94%    Weight:  80 kg    Height:        Intake/Output Summary (Last 24 hours) at 07/31/2020 1133 Last data filed at 07/31/2020 0400 Gross per 24 hour  Intake 1000 ml  Output 700 ml  Net 300 ml   Filed Weights   07/30/20 1240 07/30/20 2003  Weight: 80 kg 80 kg    Examination:  General exam: Very deconditioned, debilitated chronically looking male HEENT:PERRL,Oral mucosa moist, Ear/Nose normal on gross exam Respiratory system: Bilateral equal air entry, normal vesicular breath sounds, no wheezes or crackles  Cardiovascular system: S1 & S2 heard, RRR. No JVD, murmurs, rubs, gallops or clicks. No pedal edema.  Chemo-Port on the left chest Gastrointestinal system: Abdomen is nondistended, soft and nontender. No organomegaly or masses felt. Normal bowel sounds heard. Central nervous system: Alert and oriented. No focal neurological deficits. Extremities: No edema, no clubbing ,no cyanosis, distal peripheral pulses palpable. Skin: No rashes, lesions or ulcers,no icterus ,no pallor  Data Reviewed: I have personally reviewed following labs and imaging studies  CBC: Recent Labs  Lab 07/27/20 0841 07/28/20 0339 07/30/20 1310  WBC 5.8 4.5 7.0  NEUTROABS 3.9  --  5.2  HGB 9.5* 8.2* 8.9*  HCT 30.3* 26.9* 28.9*  MCV 103.4* 103.1* 104.3*  PLT 374 342 989   Basic Metabolic Panel: Recent Labs  Lab 07/27/20 0841 07/28/20 0339 07/30/20 1310 07/31/20 0645  NA 136 137 134* 136  K 4.6 4.5 3.8 3.5  CL 101 103 101 100  CO2 26 25 23  21*  GLUCOSE 140* 94 104* 99  BUN 29* 34* 25* 25*  CREATININE 1.74* 1.83* 1.78* 1.76*  CALCIUM 11.7* 11.2* 9.7 9.6  MG 2.0  --   --   --    GFR: Estimated Creatinine Clearance: 39.2 mL/min (A) (by C-G formula based on SCr of 1.76 mg/dL (H)). Liver Function Tests: Recent Labs  Lab 07/27/20 0841 07/28/20 0339  AST 16 12*  ALT 14 13  ALKPHOS 111 93  BILITOT 0.4 0.3  PROT 7.6 6.6  ALBUMIN 3.1* 2.9*   No results for input(s): LIPASE, AMYLASE in the last  168 hours. No results for input(s): AMMONIA in the last 168 hours. Coagulation Profile: No results for input(s): INR, PROTIME in the last 168 hours. Cardiac Enzymes: No results for input(s): CKTOTAL, CKMB, CKMBINDEX, TROPONINI in the last 168 hours. BNP (last 3 results) No results for input(s): PROBNP in the last 8760 hours. HbA1C: No results for input(s): HGBA1C in the last 72 hours. CBG: Recent Labs  Lab 07/28/20 1129 07/28/20 1603 07/28/20 2143 07/29/20 0736 07/29/20 1117  GLUCAP 126* 129* 93 110* 122*   Lipid Profile: No results for input(s): CHOL, HDL, LDLCALC, TRIG, CHOLHDL, LDLDIRECT in the last 72 hours. Thyroid Function Tests: Recent Labs    07/28/20 1206  TSH 10.955*  FREET4 0.85   Anemia Panel: Recent Labs    07/28/20 1206  VITAMINB12 173*  FOLATE 14.7  Sepsis Labs: No results for input(s): PROCALCITON, LATICACIDVEN in the last 168 hours.  Recent Results (from the past 240 hour(s))  SARS Coronavirus 2 by RT PCR (hospital order, performed in [place] hospital lab)     Status: None   Collection Time: 07/27/20  3:24 PM  Result Value Ref Range Status   SARS Coronavirus 2 NEGATIVE NEGATIVE Final    Comment: (NOTE) SARS-CoV-2 target nucleic acids are NOT DETECTED.  The SARS-CoV-2 RNA is generally detectable in upper and lower respiratory specimens during the acute phase of infection. The lowest concentration of SARS-CoV-2 viral copies this assay can detect is 250 copies / mL. A negative result does not preclude SARS-CoV-2 infection and should not be used as the sole basis for treatment or other patient management decisions.  A negative result may occur with improper specimen collection / handling, submission of specimen other than nasopharyngeal swab, presence of viral mutation(s) within the areas targeted by this assay, and inadequate number of viral copies (<250 copies / mL). A negative result must be combined with clinical observations, patient  history, and epidemiological information.  Fact Sheet for Patients:   StrictlyIdeas.no  Fact Sheet for Healthcare Providers: BankingDealers.co.za  This test is not yet approved or  cleared by the [place] FDA and has been authorized for detection and/or diagnosis of SARS-CoV-2 by FDA under an Emergency Use Authorization (EUA).  This EUA will remain in effect (meaning this test can be used) for the duration of the COVID-19 declaration under Section 564(b)(1) of the Act, 21 U.S.C. section 360bbb-3(b)(1), unless the authorization is terminated or revoked sooner.  Performed at [facility], [address] [place], [place] [address]   MRSA PCR Screening     Status: None   Collection Time: 07/27/20  3:49 PM   Specimen: Nasopharyngeal Swab  Result Value Ref Range Status   MRSA by PCR NEGATIVE NEGATIVE Final    Comment:        The GeneXpert MRSA Assay (FDA approved for NASAL specimens only), is one component of a comprehensive MRSA colonization surveillance program. It is not intended to diagnose MRSA infection nor to guide or monitor treatment for MRSA infections. Performed at [facility], [address] [place], [place] [address]   Culture, Urine     Status: Abnormal   Collection Time: 07/28/20 12:02 PM   Specimen: Urine, Random  Result Value Ref Range Status   Specimen Description   Final    URINE, RANDOM Performed at [facility], [address], [place], [place] [address]    Special Requests   Final    NONE Performed at [facility], [address], [place], [place] [address]    Culture MULTIPLE SPECIES PRESENT, SUGGEST RECOLLECTION (A)  Final   Report Status 07/29/2020 FINAL  Final  Respiratory Panel by RT PCR (Flu A&B, Covid) - Nasopharyngeal Swab     Status: None   Collection Time: 07/29/20 12:44 PM   Specimen: Nasopharyngeal Swab  Result Value Ref Range Status   SARS Coronavirus 2 by RT PCR NEGATIVE NEGATIVE Final     Comment: (NOTE) SARS-CoV-2 target nucleic acids are NOT DETECTED.  The SARS-CoV-2 RNA is generally detectable in upper respiratoy specimens during the acute phase of infection. The lowest concentration of SARS-CoV-2 viral copies this assay can detect is 131 copies/mL. A negative result does not preclude SARS-Cov-2 infection and should not be used as the sole basis for treatment or other patient management decisions. A negative result may occur with  improper specimen collection/handling, submission of specimen other than nasopharyngeal swab, presence of viral mutation(s) within the areas targeted by this assay, and inadequate number of viral copies (<131 copies/mL). A negative result must be combined with clinical observations, patient history, and epidemiological information. The expected result is Negative.  Fact Sheet for Patients:  PinkCheek.be  Fact Sheet for Healthcare Providers:  GravelBags.it  This test is no t yet approved or cleared by the [place] FDA and  has been authorized for detection and/or diagnosis of SARS-CoV-2 by FDA under an Emergency Use Authorization (EUA). This EUA will remain  in effect (meaning this test can be used) for the duration of the COVID-19 declaration under Section 564(b)(1) of the Act, 21 U.S.C. section 360bbb-3(b)(1), unless the authorization is terminated or revoked sooner.     Influenza A by PCR NEGATIVE NEGATIVE Final   Influenza B by PCR NEGATIVE NEGATIVE Final    Comment: (NOTE) The Xpert Xpress SARS-CoV-2/FLU/RSV assay is intended as an aid in  the diagnosis of influenza from Nasopharyngeal swab specimens and  should not be used as a sole basis for treatment. Nasal washings and  aspirates are unacceptable for Xpert Xpress SARS-CoV-2/FLU/RSV  testing.  Fact Sheet for Patients: PinkCheek.be  Fact Sheet for Healthcare  Providers: GravelBags.it  This test is not yet approved or cleared by the [place] FDA and  has been authorized for detection and/or diagnosis of SARS-CoV-2 by  FDA under an Emergency Use Authorization (EUA). This EUA will remain  in effect (meaning this test can be used) for the duration of the  Covid-19 declaration under Section 564(b)(1) of the Act, 21  U.S.C. section 360bbb-3(b)(1), unless the authorization is  terminated or revoked. Performed at [facility], [address], [place], [place] [address]   Resp Panel by RT PCR (RSV, Flu A&B, Covid) - Nasopharyngeal Swab     Status: None   Collection Time: 07/30/20  2:41 PM   Specimen: Nasopharyngeal Swab  Result Value Ref Range Status   SARS Coronavirus 2 by RT PCR NEGATIVE NEGATIVE Final    Comment: (NOTE) SARS-CoV-2 target nucleic acids are NOT DETECTED.  The SARS-CoV-2 RNA is generally detectable in upper respiratoy specimens during the acute phase of infection. The lowest concentration of SARS-CoV-2 viral copies this assay can detect is 131 copies/mL. A negative result does not preclude SARS-Cov-2 infection and should not be used as the sole basis for treatment or other patient management decisions. A negative result may occur with  improper specimen collection/handling, submission of specimen other than nasopharyngeal swab, presence of viral mutation(s) within the areas targeted by this assay, and inadequate number of viral copies (<131 copies/mL). A negative result must be combined with clinical observations, patient history, and epidemiological information. The expected result is Negative.  Fact Sheet for Patients:  PinkCheek.be  Fact Sheet for Healthcare Providers:  GravelBags.it  This test is no t yet approved or cleared by the [place] FDA and  has been authorized for detection and/or diagnosis of SARS-CoV-2 by FDA under  an Emergency Use Authorization (EUA). This EUA will remain  in effect (meaning this test can be used) for the duration of the COVID-19 declaration under Section 564(b)(1) of the Act, 21 U.S.C. section 360bbb-3(b)(1), unless the authorization is terminated or revoked sooner.     Influenza A by PCR NEGATIVE NEGATIVE Final   Influenza B by PCR NEGATIVE NEGATIVE Final    Comment: (NOTE) The Xpert Xpress SARS-CoV-2/FLU/RSV assay is intended as an aid in  the diagnosis of influenza from Nasopharyngeal swab specimens and  should not be used as a sole basis for treatment. Nasal washings and  aspirates are unacceptable for Xpert Xpress SARS-CoV-2/FLU/RSV  testing.  Fact Sheet for Patients: PinkCheek.be  Fact Sheet for Healthcare Providers: GravelBags.it  This test is not yet approved or cleared by the [place] FDA and  has been authorized for detection and/or diagnosis of SARS-CoV-2 by  FDA under an Emergency Use Authorization (EUA). This EUA will remain  in effect (meaning this test can be used) for the duration of the  Covid-19 declaration under Section 564(b)(1) of the Act, 21  U.S.C. section 360bbb-3(b)(1), unless the authorization is  terminated or revoked.    Respiratory Syncytial Virus by PCR NEGATIVE NEGATIVE Final    Comment: (NOTE) Fact Sheet for Patients: PinkCheek.be  Fact Sheet for Healthcare Providers: GravelBags.it  This test is not yet approved or cleared by the [place] FDA and  has been authorized for detection and/or diagnosis of SARS-CoV-2 by  FDA under an Emergency Use Authorization (EUA). This EUA will remain  in effect (meaning this test can be used) for the duration of the  COVID-19 declaration under Section 564(b)(1) of the Act, 21 U.S.C.  section 360bbb-3(b)(1), unless the authorization is terminated or  revoked. Performed at [facility], [address], [place], [place] [address]          Radiology Studies: No results found.      Scheduled Meds: . apixaban  5 mg Oral BID  . Chlorhexidine Gluconate Cloth  6 each Topical Daily  . cyclobenzaprine  10 mg Oral QHS  . dapagliflozin propanediol  10 mg Oral Daily  . feeding supplement (ENSURE ENLIVE)  237 mL Oral BID BM  . gabapentin  600 mg Oral TID  . metFORMIN  500 mg Oral Q breakfast  . mirtazapine  30 mg Oral QHS  . primidone  100 mg Oral BID  . propranolol  40 mg Oral BID  . temazepam  15 mg Oral QHS  . topiramate  200 mg Oral QHS   Continuous Infusions:   LOS: 1 day    Time spent: 35 mins.More than 50% of that time was spent in counseling and/or coordination of care.      Shelly Coss, MD [facility] P9/25/2021, 11:33 AM

## 2020-07-31 NOTE — NC FL2 (Signed)
[place] LEVEL OF CARE SCREENING TOOL     IDENTIFICATION  Patient Name: Daniel Richards Birthdate: 1946-04-05 Sex: male Admission Date (Current Location): 07/30/2020  [facility] and [place] Number:  [employer] and Address:  [facility] [address], [place]      Provider Number: [id number]  Attending Physician Name and Address:  Shelly Coss, MD  Relative Name and Phone Number:  [name] [phone]    Current Level of Care: Hospital Recommended Level of Care: [place] Prior Approval Number: [id number]  Date Approved/Denied: 07/27/20 PASRR Number:    Discharge Plan: SNF    Current Diagnoses: Patient Active Problem List   Diagnosis Date Noted  . Failure to thrive in adult 07/30/2020  . Chronic pain 07/30/2020  . Cancer related pain   . Chronic indwelling Foley catheter 07/27/2020  . Fall at home, initial encounter 07/27/2020  . Left leg pain 07/27/2020  . Chronic low back pain 07/27/2020  . Other specified types of non-hodgkin lymphoma, intrapelvic lymph nodes ([place]) 05/14/2020  . Incomplete emptying of bladder 01/22/2020  . Sepsis ([place]) 01/15/2020  . Hypokalemia   . Acute cystitis without hematuria   . Epiploic appendagitis 09/27/2019  . Acute saddle pulmonary embolism with acute cor pulmonale (HCC)   . Urinary tract infection associated with indwelling urethral catheter ([place])   . Febrile neutropenia ([place]) 08/26/2019  . PE (pulmonary thromboembolism) ([place]) 08/26/2019  . Neutropenia, drug-induced ([place]) 08/20/2019  . Diffuse large B cell lymphoma ([place]) 08/20/2019  . Port-A-Cath in place 08/13/2019  . Anemia of chronic disease 08/07/2019  . Major depression, recurrent, chronic ([place]) 08/07/2019  . BPH without urinary obstruction 08/07/2019  . Aortic atherosclerosis ([place]) 08/07/2019  . Diabetic peripheral neuropathy ([place]) 08/07/2019  . Hypertension associated with type 2 diabetes mellitus  ([place]) 08/07/2019  . 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 Odynophagia   . Acute kidney injury ([place])   . Falls frequently   . Generalized abdominal pain   . Terminal care   . Palliative care by specialist   . Folate deficiency   . Generalized weakness 07/25/2019  . Intra-abdominal lymphadenopathy 07/25/2019  . Retroperitoneal lymphadenopathy 07/25/2019  . Malnutrition of moderate degree 07/25/2019  . Tremor   . Hypercalcemia 07/24/2019  . B12 deficiency 11/18/2016  . Weakness generalized 11/16/2016  . Altered mental status, unspecified 11/16/2016  . Fracture, rib 11/16/2016  . Weakness 11/16/2016  . New onset of headaches after age 35 09/01/2015  . Morning headache 09/01/2015  . Daytime somnolence 09/01/2015  . Essential tremor 09/01/2015  . Intractable headache 09/01/2015  . Type 2 diabetes, controlled, with peripheral neuropathy ([place]) 06/09/2011    Orientation RESPIRATION BLADDER Height & Weight     Self, Time, Situation, Place  Normal Continent Weight: 176 lb 5.9 oz (80 kg) Height:  6' (182.9 cm)  BEHAVIORAL SYMPTOMS/MOOD NEUROLOGICAL BOWEL NUTRITION STATUS      Continent Diet (Diet regular Room service appropriate? Yes; Fluid consistency: Thin)  AMBULATORY STATUS COMMUNICATION OF NEEDS Skin   Extensive Assist Verbally Other (Comment) (Ecchymosis)                       Personal Care Assistance Level of Assistance  Bathing, Feeding, Dressing Bathing Assistance: Maximum assistance Feeding assistance: Maximum assistance Dressing Assistance: Maximum assistance     Functional Limitations Info  Sight, Speech, Hearing Sight Info: Adequate Hearing Info: Adequate  Speech Info: Adequate    SPECIAL CARE FACTORS FREQUENCY  PT (By licensed PT)     PT Frequency: 5x per week              Contractures Contractures Info: Not present    Additional Factors Info  Code Status, Allergies Code  Status Info: Partial Allergies Info: Vancomycin           Current Medications (07/31/2020):  This is the current hospital active medication list Current Facility-Administered Medications  Medication Dose Route Frequency Provider Last Rate Last Admin  . 0.9 %  sodium chloride infusion   Intravenous Continuous Adhikari, Amrit, MD      . apixaban (ELIQUIS) tablet 5 mg  5 mg Oral BID Truett Mainland, DO   5 mg at 07/31/20 0940  . Chlorhexidine Gluconate Cloth 2 % PADS 6 each  6 each Topical Daily Truett Mainland, DO   6 each at 07/31/20 5993  . cyclobenzaprine (FLEXERIL) tablet 10 mg  10 mg Oral QHS Truett Mainland, DO   10 mg at 07/30/20 2134  . dapagliflozin propanediol (FARXIGA) tablet 10 mg  10 mg Oral Daily Truett Mainland, DO   10 mg at 07/31/20 0954  . feeding supplement (ENSURE ENLIVE) (ENSURE ENLIVE) liquid 237 mL  237 mL Oral BID BM Truett Mainland, DO   237 mL at 07/31/20 0954  . gabapentin (NEURONTIN) capsule 600 mg  600 mg Oral TID Truett Mainland, DO   600 mg at 07/31/20 5701  . HYDROcodone-acetaminophen (NORCO) 10-325 MG per tablet 1 tablet  1 tablet Oral Q6H PRN Truett Mainland, DO      . [START ON 08/01/2020] levothyroxine (SYNTHROID) tablet 50 mcg  50 mcg Oral Q0600 Shelly Coss, MD      . mirtazapine (REMERON) tablet 30 mg  30 mg Oral QHS Truett Mainland, DO   30 mg at 07/30/20 2134  . ondansetron (ZOFRAN) tablet 4 mg  4 mg Oral Q6H PRN Truett Mainland, DO       Or  . ondansetron [facility]) injection 4 mg  4 mg Intravenous Q6H PRN Truett Mainland, DO      . primidone (MYSOLINE) tablet 100 mg  100 mg Oral BID Truett Mainland, DO   100 mg at 07/31/20 7793  . propranolol (INDERAL) tablet 40 mg  40 mg Oral BID Truett Mainland, DO   40 mg at 07/31/20 9030  . temazepam (RESTORIL) capsule 15 mg  15 mg Oral QHS Truett Mainland, DO   15 mg at 07/30/20 2134  . topiramate (TOPAMAX) tablet 200 mg  200 mg Oral QHS Truett Mainland, DO   200 mg at 07/30/20 2134      Discharge Medications: Please see discharge summary for a list of discharge medications.  Relevant Imaging Results:  Relevant Lab Results:   Additional Information Pt SSN [id number]  Natasha Bence, LCSW

## 2020-08-01 LAB — URINE CULTURE

## 2020-08-01 LAB — BASIC METABOLIC PANEL
Anion gap: 11 (ref 5–15)
BUN: 24 mg/dL — ABNORMAL HIGH (ref 8–23)
CO2: 19 mmol/L — ABNORMAL LOW (ref 22–32)
Calcium: 9.1 mg/dL (ref 8.9–10.3)
Chloride: 105 mmol/L (ref 98–111)
Creatinine, Ser: 1.64 mg/dL — ABNORMAL HIGH (ref 0.61–1.24)
GFR calc Af Amer: 46 mL/min — ABNORMAL LOW (ref >60)
GFR calc non Af Amer: 40 mL/min — ABNORMAL LOW (ref >60)
Glucose, Bld: 108 mg/dL — ABNORMAL HIGH (ref 70–99)
Potassium: 3.3 mmol/L — ABNORMAL LOW (ref 3.5–5.1)
Sodium: 135 mmol/L (ref 135–145)

## 2020-08-01 MED ORDER — GUAIFENESIN-DM 100-10 MG/5ML PO SYRP
5.0000 mL | ORAL_SOLUTION | Freq: Four times a day (QID) | ORAL | Status: DC
  Administered 2020-08-01 – 2020-08-03 (×6): 5 mL via ORAL
  Filled 2020-08-01 (×6): qty 5

## 2020-08-01 MED ORDER — POTASSIUM CHLORIDE 20 MEQ PO PACK
40.0000 meq | PACK | Freq: Two times a day (BID) | ORAL | Status: DC
  Administered 2020-08-01 – 2020-08-02 (×4): 40 meq via ORAL
  Filled 2020-08-01 (×4): qty 2

## 2020-08-01 NOTE — Progress Notes (Addendum)
PROGRESS NOTE    Daniel Richards  KDT:[id number] DOB: 1946-05-14 DOA: 07/30/2020 PCP: [name], FNP   Brief Narrative:  Patient is a 74 year old male with history of B-cell lymphoma, hypercalcemia, diabetes with neuropathy, depression, history of PE, hypertension, GERD who was recently managed and was discharged from here for mild hypercalcemia, multiple falls to residential hospice of [facility].  He was brought back to the emergency department with complaints of generalized weakness.  Now the plan is to discharge him to skilled nursing facility.  Assessment & Plan:   Principal Problem:   Failure to thrive in adult Active Problems:   Essential tremor   Generalized weakness   Malnutrition of moderate degree   Diabetic peripheral neuropathy (HCC)   Hypertension associated with type 2 diabetes mellitus (HCC)   Diffuse large B cell lymphoma (HCC)   Other specified types of non-hodgkin lymphoma, intrapelvic lymph nodes (HCC)   Chronic pain   Failure to thrive: Due to poor oral intake, severe malnutrition.  Requested   nutrition consult.  This is secondary to his advanced malignancy.  Continue to encourage oral intake.  B-cell lymphoma: Follows with Dr. Delton Coombes.  S/P 6 cycles of chemotherapy.  PET scan on 07/19/2020 showed progressive lymphadenopathy, multifocal bone lesions.  I discussed with Dr. Delton Coombes this morning and he is planning to continue chemotherapy in the future.  He has a Chemo-Port on the left chest.  CKD stage IIIb: Baseline creatinine ranges from 1.6-1.9.  Currently kidney function at baseline.  Elevated TSH: Free T4 normal.  Will start on low-dose Synthyroid for this subclinical hypothyroidism with TSH of more than 10.  Hypercalcemia: Resolved  Back pain/left hip pain: Continue supportive care, pain management.  He has bone lesions from the lymphoma.  Urinary retention: He has chronic indwelling Foley catheter.  History of PE: Diagnosed  with PE on 08/2019.  On Eliquis  Diabetes type 2: Controlled.  Last hemoglobin A1c of 6.3 as per 07/27/2020.On farxiga, Metformin.  He is on gabapentin for diabetic neuropathy.  Essential tremor: Has hand tremors.  On Mysoline and propanolol  Depression/anxiety: He was on  Celexa at home,now started on Remeron  Suspected UTI: Denies any dysuria.  He complains of abdomen pain which is chronic.  He has a Foley catheter.  Urinalysis showed leukocytes.  Urine culture showed multiple organisms.  Goals of care: On his last admission, palliative care was consulted.  Goals of care were discussed with the daughter and he was discharged to hospice.  As per my discussion to Dr. Delton Coombes today.  He was not informed about this.  Daughter was not happy because he was transferred to hospice and he was brought back to the hospital.  Currently partial code.  Sinus tachycardia: Most likely attributed due to dehydration, pain.  Continue gentle IV fluids.  He also had mild grade fever last night . Afebrile this mrng.  Generalized weakness/falls: Physical therapy consulted.  Will consult social worker for skilled nursing facility placement.         DVT prophylaxis:Eliquis Code Status: partial Family Communication: Called and discussed with the daughter on phone on 07/31/20 Status is: Inpatient  Remains inpatient appropriate because:Unsafe d/c plan   Dispo: The patient is from: Residential hospice              Anticipated d/c is to: SNF              Anticipated d/c date is: 2 days  Patient currently is medically stable to d/c.      Consultants: None  Procedures:None Antimicrobials:  Anti-infectives (From admission, onward)   None      Subjective: Patient seen and examined at the bedside this morning.  Comfortable during my evaluation.  Denies any complaints.  Eating his breakfast.  In sinus tachycardia.  No nausea vomiting or abdominal pain.  Objective: Vitals:   07/31/20  1430 07/31/20 2025 07/31/20 2300 08/01/20 0633  BP: (!) 118/59 (!) 116/53 117/66 116/79  Pulse:  (!) 103 (!) 112 (!) 109  Resp: 16 16 18 18   Temp: 98.5 F (36.9 C) (!) 100.8 F (38.2 C) 98.4 F (36.9 C) 98.9 F (37.2 C)  TempSrc: Oral  Oral   SpO2:  96% 98% 97%  Weight:      Height:        Intake/Output Summary (Last 24 hours) at 08/01/2020 0739 Last data filed at 08/01/2020 0500 Gross per 24 hour  Intake 1133.54 ml  Output 1000 ml  Net 133.54 ml   Filed Weights   07/30/20 1240 07/30/20 2003  Weight: 80 kg 80 kg    Examination:  General exam: Very deconditioned, debilitated elderly male HEENT:PERRL,Oral mucosa moist, Ear/Nose normal on gross exam Respiratory system: Bilateral equal air entry, normal vesicular breath sounds, no wheezes or crackles  Cardiovascular system: Sinus tachycardia, RRR. No JVD, murmurs, rubs, gallops or clicks.  Chemo-Port on the left chest Gastrointestinal system: Abdomen is nondistended, soft and nontender. No organomegaly or masses felt. Normal bowel sounds heard. Central nervous system: Alert and oriented. No focal neurological deficits. Extremities: No edema, no clubbing ,no cyanosis Skin: No rashes, lesions or ulcers,no icterus ,no pallor  Data Reviewed: I have personally reviewed following labs and imaging studies  CBC: Recent Labs  Lab 07/27/20 0841 07/28/20 0339 07/30/20 1310  WBC 5.8 4.5 7.0  NEUTROABS 3.9  --  5.2  HGB 9.5* 8.2* 8.9*  HCT 30.3* 26.9* 28.9*  MCV 103.4* 103.1* 104.3*  PLT 374 342 371   Basic Metabolic Panel: Recent Labs  Lab 07/27/20 0841 07/28/20 0339 07/30/20 1310 07/31/20 0645  NA 136 137 134* 136  K 4.6 4.5 3.8 3.5  CL 101 103 101 100  CO2 26 25 23  21*  GLUCOSE 140* 94 104* 99  BUN 29* 34* 25* 25*  CREATININE 1.74* 1.83* 1.78* 1.76*  CALCIUM 11.7* 11.2* 9.7 9.6  MG 2.0  --   --   --    GFR: Estimated Creatinine Clearance: 39.2 mL/min (A) (by C-G formula based on SCr of 1.76 mg/dL (H)). Liver  Function Tests: Recent Labs  Lab 07/27/20 0841 07/28/20 0339  AST 16 12*  ALT 14 13  ALKPHOS 111 93  BILITOT 0.4 0.3  PROT 7.6 6.6  ALBUMIN 3.1* 2.9*   No results for input(s): LIPASE, AMYLASE in the last 168 hours. No results for input(s): AMMONIA in the last 168 hours. Coagulation Profile: No results for input(s): INR, PROTIME in the last 168 hours. Cardiac Enzymes: No results for input(s): CKTOTAL, CKMB, CKMBINDEX, TROPONINI in the last 168 hours. BNP (last 3 results) No results for input(s): PROBNP in the last 8760 hours. HbA1C: No results for input(s): HGBA1C in the last 72 hours. CBG: Recent Labs  Lab 07/28/20 1129 07/28/20 1603 07/28/20 2143 07/29/20 0736 07/29/20 1117  GLUCAP 126* 129* 93 110* 122*   Lipid Profile: No results for input(s): CHOL, HDL, LDLCALC, TRIG, CHOLHDL, LDLDIRECT in the last 72 hours. Thyroid Function Tests: No results for  input(s): TSH, T4TOTAL, FREET4, T3FREE, THYROIDAB in the last 72 hours. Anemia Panel: No results for input(s): VITAMINB12, FOLATE, FERRITIN, TIBC, IRON, RETICCTPCT in the last 72 hours. Sepsis Labs: No results for input(s): PROCALCITON, LATICACIDVEN in the last 168 hours.  Recent Results (from the past 240 hour(s))  SARS Coronavirus 2 by RT PCR (hospital order, performed in [place] hospital lab)     Status: None   Collection Time: 07/27/20  3:24 PM  Result Value Ref Range Status   SARS Coronavirus 2 NEGATIVE NEGATIVE Final    Comment: (NOTE) SARS-CoV-2 target nucleic acids are NOT DETECTED.  The SARS-CoV-2 RNA is generally detectable in upper and lower respiratory specimens during the acute phase of infection. The lowest concentration of SARS-CoV-2 viral copies this assay can detect is 250 copies / mL. A negative result does not preclude SARS-CoV-2 infection and should not be used as the sole basis for treatment or other patient management decisions.  A negative result may occur with improper specimen  collection / handling, submission of specimen other than nasopharyngeal swab, presence of viral mutation(s) within the areas targeted by this assay, and inadequate number of viral copies (<250 copies / mL). A negative result must be combined with clinical observations, patient history, and epidemiological information.  Fact Sheet for Patients:   StrictlyIdeas.no  Fact Sheet for Healthcare Providers: BankingDealers.co.za  This test is not yet approved or  cleared by the [place] FDA and has been authorized for detection and/or diagnosis of SARS-CoV-2 by FDA under an Emergency Use Authorization (EUA).  This EUA will remain in effect (meaning this test can be used) for the duration of the COVID-19 declaration under Section 564(b)(1) of the Act, 21 U.S.C. section 360bbb-3(b)(1), unless the authorization is terminated or revoked sooner.  Performed at [facility], [address], [place], [place] [address]   MRSA PCR Screening     Status: None   Collection Time: 07/27/20  3:49 PM   Specimen: Nasopharyngeal Swab  Result Value Ref Range Status   MRSA by PCR NEGATIVE NEGATIVE Final    Comment:        The GeneXpert MRSA Assay (FDA approved for NASAL specimens only), is one component of a comprehensive MRSA colonization surveillance program. It is not intended to diagnose MRSA infection nor to guide or monitor treatment for MRSA infections. Performed at [facility], [address], [place], [place] [address]   Culture, Urine     Status: Abnormal   Collection Time: 07/28/20 12:02 PM   Specimen: Urine, Random  Result Value Ref Range Status   Specimen Description   Final    URINE, RANDOM Performed at [facility], [address] [place], [place] [address]    Special Requests   Final    NONE Performed at [facility], [address] [place], [place] [address]    Culture MULTIPLE SPECIES PRESENT, SUGGEST RECOLLECTION (A)  Final    Report Status 07/29/2020 FINAL  Final  Respiratory Panel by RT PCR (Flu A&B, Covid) - Nasopharyngeal Swab     Status: None   Collection Time: 07/29/20 12:44 PM   Specimen: Nasopharyngeal Swab  Result Value Ref Range Status   SARS Coronavirus 2 by RT PCR NEGATIVE NEGATIVE Final    Comment: (NOTE) SARS-CoV-2 target nucleic acids are NOT DETECTED.  The SARS-CoV-2 RNA is generally detectable in upper respiratoy specimens during the acute phase of infection. The lowest concentration of SARS-CoV-2 viral copies this assay can detect is 131 copies/mL. A negative  result does not preclude SARS-Cov-2 infection and should not be used as the sole basis for treatment or other patient management decisions. A negative result may occur with  improper specimen collection/handling, submission of specimen other than nasopharyngeal swab, presence of viral mutation(s) within the areas targeted by this assay, and inadequate number of viral copies (<131 copies/mL). A negative result must be combined with clinical observations, patient history, and epidemiological information. The expected result is Negative.  Fact Sheet for Patients:  PinkCheek.be  Fact Sheet for Healthcare Providers:  GravelBags.it  This test is no t yet approved or cleared by the [place] FDA and  has been authorized for detection and/or diagnosis of SARS-CoV-2 by FDA under an Emergency Use Authorization (EUA). This EUA will remain  in effect (meaning this test can be used) for the duration of the COVID-19 declaration under Section 564(b)(1) of the Act, 21 U.S.C. section 360bbb-3(b)(1), unless the authorization is terminated or revoked sooner.     Influenza A by PCR NEGATIVE NEGATIVE Final   Influenza B by PCR NEGATIVE NEGATIVE Final    Comment: (NOTE) The Xpert Xpress SARS-CoV-2/FLU/RSV assay is intended as an aid in  the diagnosis of influenza from Nasopharyngeal swab  specimens and  should not be used as a sole basis for treatment. Nasal washings and  aspirates are unacceptable for Xpert Xpress SARS-CoV-2/FLU/RSV  testing.  Fact Sheet for Patients: PinkCheek.be  Fact Sheet for Healthcare Providers: GravelBags.it  This test is not yet approved or cleared by the [place] FDA and  has been authorized for detection and/or diagnosis of SARS-CoV-2 by  FDA under an Emergency Use Authorization (EUA). This EUA will remain  in effect (meaning this test can be used) for the duration of the  Covid-19 declaration under Section 564(b)(1) of the Act, 21  U.S.C. section 360bbb-3(b)(1), unless the authorization is  terminated or revoked. Performed at [facility], [address] [place], [place] [address]   Resp Panel by RT PCR (RSV, Flu A&B, Covid) - Nasopharyngeal Swab     Status: None   Collection Time: 07/30/20  2:41 PM   Specimen: Nasopharyngeal Swab  Result Value Ref Range Status   SARS Coronavirus 2 by RT PCR NEGATIVE NEGATIVE Final    Comment: (NOTE) SARS-CoV-2 target nucleic acids are NOT DETECTED.  The SARS-CoV-2 RNA is generally detectable in upper respiratoy specimens during the acute phase of infection. The lowest concentration of SARS-CoV-2 viral copies this assay can detect is 131 copies/mL. A negative result does not preclude SARS-Cov-2 infection and should not be used as the sole basis for treatment or other patient management decisions. A negative result may occur with  improper specimen collection/handling, submission of specimen other than nasopharyngeal swab, presence of viral mutation(s) within the areas targeted by this assay, and inadequate number of viral copies (<131 copies/mL). A negative result must be combined with clinical observations, patient history, and epidemiological information. The expected result is Negative.  Fact Sheet for Patients:    PinkCheek.be  Fact Sheet for Healthcare Providers:  GravelBags.it  This test is no t yet approved or cleared by the [place] FDA and  has been authorized for detection and/or diagnosis of SARS-CoV-2 by FDA under an Emergency Use Authorization (EUA). This EUA will remain  in effect (meaning this test can be used) for the duration of the COVID-19 declaration under Section 564(b)(1) of the Act, 21 U.S.C. section 360bbb-3(b)(1), unless the authorization is terminated or revoked sooner.     Influenza A  by PCR NEGATIVE NEGATIVE Final   Influenza B by PCR NEGATIVE NEGATIVE Final    Comment: (NOTE) The Xpert Xpress SARS-CoV-2/FLU/RSV assay is intended as an aid in  the diagnosis of influenza from Nasopharyngeal swab specimens and  should not be used as a sole basis for treatment. Nasal washings and  aspirates are unacceptable for Xpert Xpress SARS-CoV-2/FLU/RSV  testing.  Fact Sheet for Patients: PinkCheek.be  Fact Sheet for Healthcare Providers: GravelBags.it  This test is not yet approved or cleared by the [place] FDA and  has been authorized for detection and/or diagnosis of SARS-CoV-2 by  FDA under an Emergency Use Authorization (EUA). This EUA will remain  in effect (meaning this test can be used) for the duration of the  Covid-19 declaration under Section 564(b)(1) of the Act, 21  U.S.C. section 360bbb-3(b)(1), unless the authorization is  terminated or revoked.    Respiratory Syncytial Virus by PCR NEGATIVE NEGATIVE Final    Comment: (NOTE) Fact Sheet for Patients: PinkCheek.be  Fact Sheet for Healthcare Providers: GravelBags.it  This test is not yet approved or cleared by the [place] FDA and  has been authorized for detection and/or diagnosis of SARS-CoV-2 by  FDA under an Emergency  Use Authorization (EUA). This EUA will remain  in effect (meaning this test can be used) for the duration of the  COVID-19 declaration under Section 564(b)(1) of the Act, 21 U.S.C.  section 360bbb-3(b)(1), unless the authorization is terminated or  revoked. Performed at [facility], [address] [place], [place] [address]          Radiology Studies: No results found.      Scheduled Meds: . apixaban  5 mg Oral BID  . Chlorhexidine Gluconate Cloth  6 each Topical Daily  . cyclobenzaprine  10 mg Oral QHS  . dapagliflozin propanediol  10 mg Oral Daily  . feeding supplement (ENSURE ENLIVE)  237 mL Oral BID BM  . gabapentin  600 mg Oral TID  . levothyroxine  50 mcg Oral Q0600  . mirtazapine  30 mg Oral QHS  . primidone  100 mg Oral BID  . propranolol  40 mg Oral BID  . temazepam  15 mg Oral QHS  . topiramate  200 mg Oral QHS   Continuous Infusions: . sodium chloride 75 mL/hr at 08/01/20 0536     LOS: 2 days    Time spent: 35 mins.More than 50% of that time was spent in counseling and/or coordination of care.      Shelly Coss, MD [facility] P9/26/2021, 7:39 AM

## 2020-08-01 NOTE — Progress Notes (Signed)
   08/01/20 0832  Assess: MEWS Score  Temp 98.8 F (37.1 C)  BP (!) 113/50  Pulse Rate (!) 123  Resp 17  SpO2 98 %  O2 Device Room Air  Assess: MEWS Score  MEWS Temp 0  MEWS Systolic 0  MEWS Pulse 2  MEWS RR 0  MEWS LOC 0  MEWS Score 2  MEWS Score Color Yellow  Assess: if the MEWS score is Yellow or Red  Were vital signs taken at a resting state? Yes  Focused Assessment No change from prior assessment  Early Detection of Sepsis Score *See Row Information* High  MEWS guidelines implemented *See Row Information* No, vital signs rechecked  Treat  MEWS Interventions Administered scheduled meds/treatments  Pain Scale 0-10  Pain Score 0  Take Vital Signs  Increase Vital Sign Frequency  Yellow: Q 2hr X 2 then Q 4hr X 2, if remains yellow, continue Q 4hrs  Escalate  MEWS: Escalate Yellow: discuss with charge nurse/RN and consider discussing with provider and RRT  Notify: Charge Nurse/RN  Name of Charge Nurse/RN Notified L. Bullins RN  Date Charge Nurse/RN Notified 08/01/20  Time Charge Nurse/RN Notified [phone]  Notify: Provider  Provider Name/Title Adhikari MD  Date Provider Notified 08/01/20  Time Provider Notified [phone]  Notification Type Page  Notification Reason Other (Comment)  Response No new orders  Date of Provider Response 08/01/20  Time of Provider Response [phone]  Document  Patient Outcome  (cintinue to monitor)  Progress note created (see row info) Yes

## 2020-08-02 LAB — BASIC METABOLIC PANEL
Anion gap: 11 (ref 5–15)
BUN: 20 mg/dL (ref 8–23)
CO2: 20 mmol/L — ABNORMAL LOW (ref 22–32)
Calcium: 8.8 mg/dL — ABNORMAL LOW (ref 8.9–10.3)
Chloride: 105 mmol/L (ref 98–111)
Creatinine, Ser: 1.52 mg/dL — ABNORMAL HIGH (ref 0.61–1.24)
GFR calc Af Amer: 51 mL/min — ABNORMAL LOW (ref >60)
GFR calc non Af Amer: 44 mL/min — ABNORMAL LOW (ref >60)
Glucose, Bld: 127 mg/dL — ABNORMAL HIGH (ref 70–99)
Potassium: 3.5 mmol/L (ref 3.5–5.1)
Sodium: 136 mmol/L (ref 135–145)

## 2020-08-02 MED ORDER — SODIUM CHLORIDE 0.9 % IV SOLN: INTRAVENOUS | Status: DC

## 2020-08-02 MED ORDER — METOPROLOL TARTRATE 25 MG PO TABS
25.0000 mg | ORAL_TABLET | Freq: Two times a day (BID) | ORAL | Status: DC
  Administered 2020-08-02 (×2): 25 mg via ORAL
  Filled 2020-08-02 (×2): qty 1

## 2020-08-02 NOTE — Plan of Care (Signed)

## 2020-08-02 NOTE — Progress Notes (Signed)
**Note De-identified Flannery Cavallero Obfuscation** EKG complete; placed in patient chart 

## 2020-08-02 NOTE — TOC Progression Note (Addendum)
Transition of Care [facility]) - Progression Note    Patient Details  Name: Daniel Richards MRN: [id number] Date of Birth: Jul 04, 1946  Transition of Care [facility] CM/SW [place], [place] Phone Number: 08/02/2020, 12:20 PM  Clinical Narrative:    CSW spoke with Ebony Hail at [facility] to confirm bed offer. Ebony Hail stated the due to pts infusion at 8:30 on 9/28 they do not have sufficient notice to set up transport. [facility] able to accept pt tomorrow after infusion. CSW updated pts daughter [name] of expected D/C to [facility] tomorrow 9/28. TOC to follow.     Barriers to Discharge: Continued Medical Work up, SNF Pending bed offer  Expected Discharge Plan and Services                                                 Social Determinants of Health (SDOH) Interventions    Readmission Risk Interventions Readmission Risk Prevention Plan 01/18/2020 09/30/2019 07/30/2019  Post Dischage Appt - - Not Complete  Appt Comments - [facility] MD will follow while pt at short term rehab  Medication Screening - - -  Transportation Screening Complete Complete -  Medication Review Press photographer) Complete Complete -  PCP or Specialist appointment within 3-5 days of discharge - Not Complete -  [place] or Home Care Consult Complete Complete -  SW Recovery Care/Counseling Consult Complete Complete -  Palliative Care Screening Not Applicable Not Complete -  [place] Not Applicable Not Complete -  Some recent data might be hidden

## 2020-08-02 NOTE — Evaluation (Signed)
Physical Therapy Evaluation Patient Details Name: Daniel Richards MRN: [id number] DOB: Oct 17, 1946 Today's Date: 08/02/2020   History of Present Illness  Patient is a 74 year old male with history of B-cell lymphoma, hypercalcemia, diabetes with neuropathy, depression, history of PE, hypertension, GERD who was recently managed and was discharged from here for mild hypercalcemia, multiple falls to residential hospice of [facility].  He was brought back to the emergency department with complaints of generalized weakness.  Now the plan is to discharge him to skilled nursing facility.    Clinical Impression  Daughter present throughout session. Pt admitted with above diagnosis. Patient requires mod to max assist for a mobility. Significant trunk forward and lateral left flexion in sitting with poor balance exhibited. Limited to side-steps along bed requiring blocking of knees for safety. Upon completing side-stepping, patient stated he needed the [facility] but was unable to complete the stand pivot transfer prior to soiling the bed. Nursing was notified and came into the room. Patient was repositioned in bed and nursing was completing hygiene upon PT departure. Pt currently with functional limitations due to the deficits listed below (see PT Problem List). Pt will benefit from skilled PT to increase their independence and safety with mobility to allow discharge to the venue listed below.       Follow Up Recommendations SNF;Supervision/Assistance - 24 hour    Equipment Recommendations  None recommended by PT    Recommendations for Other Services       Precautions / Restrictions Precautions Precautions: Fall Restrictions Weight Bearing Restrictions: No      Mobility  Bed Mobility Overal bed mobility: Needs Assistance Bed Mobility: Rolling;Supine to Sit;Sit to Supine Rolling: Min assist;Min guard   Supine to sit: Mod assist;HOB elevated Sit to supine: Max assist;HOB elevated   General bed  mobility comments: rolling - bedrail use;  Transfers Overall transfer level: Needs assistance Equipment used: Rolling walker (2 wheeled);2 person hand held assist Transfers: Sit to/from [facility] Sit to Stand: Mod assist;Max assist Stand pivot transfers: Mod assist;Max assist       General transfer comment: verbal cues for sequencing of steps and placement of hands; blocking of knees required for safety  Ambulation/Gait Ambulation/Gait assistance: Mod assist;Max assist Gait Distance (Feet): 4 Feet Assistive device: Rolling walker (2 wheeled);2 person hand held assist Gait Pattern/deviations: Step-to pattern;Decreased step length - right;Decreased step length - left;Decreased stride length;Decreased stance time - left;Trunk flexed;Narrow base of support;Leaning posteriorly;Decreased weight shift to left Gait velocity: decreased   General Gait Details: slow, labored gait requiring knees blocked for safety; verbal and tactile cues for sequencing of steps and placement of hands; limited by weakness and fatigue.      Balance Overall balance assessment: Needs assistance Sitting-balance support: Feet supported;Bilateral upper extremity supported Sitting balance-Leahy Scale: Poor Sitting balance - Comments: frequent fall over to the left while seated at EOB Postural control: Left lateral lean;Posterior lean Standing balance support: During functional activity;Bilateral upper extremity supported Standing balance-Leahy Scale: Poor Standing balance comment: using RW         Pertinent Vitals/Pain Pain Assessment: No/denies pain    Home Living Family/patient expects to be discharged to:: Skilled nursing facility Living Arrangements: Other relatives (Patient states her daughter and Son-n-law lives with him) Available Help at Discharge: Family;Available 24 hours/day Type of Home: House Home Access: Ramped entrance     Home Layout: One level Home Equipment: Cane -  single point;Walker - 2 wheels;Grab bars - tub/shower;Bedside commode;Shower seat;Toilet riser;Hand held shower head  Prior Function Level of Independence: Needs assistance   Gait / Transfers Assistance Needed: Household ambulator using RW  ADL's / Homemaking Assistance Needed: assisted by daughter  Comments: pt has been living with daughter since d/c from last hospital stay due to assistance needed     Hand Dominance   Dominant Hand: Right    Extremity/Trunk Assessment   Upper Extremity Assessment Upper Extremity Assessment: Generalized weakness    Lower Extremity Assessment Lower Extremity Assessment: Generalized weakness    Cervical / Trunk Assessment Cervical / Trunk Assessment: Kyphotic  Communication   Communication: No difficulties  Cognition Arousal/Alertness: Awake/alert Behavior During Therapy: WFL for tasks assessed/performed Overall Cognitive Status: Within Functional Limits for tasks assessed       General Comments      Exercises     Assessment/Plan    PT Assessment Patient needs continued PT services  PT Problem List Decreased strength;Decreased activity tolerance;Decreased balance;Decreased mobility       PT Treatment Interventions DME instruction;Gait training;Functional mobility training;Therapeutic activities;Therapeutic exercise;Patient/family education;Balance training;Wheelchair mobility training    PT Goals (Current goals can be found in the Care Plan section)  Acute Rehab PT Goals Patient Stated Goal: Go to a SNF PT Goal Formulation: With patient/family Time For Goal Achievement: 08/16/20 Potential to Achieve Goals: Fair    Frequency Min 3X/week   Barriers to discharge           AM-PAC PT "6 Clicks" Mobility  Outcome Measure Help needed turning from your back to your side while in a flat bed without using bedrails?: A Lot Help needed moving from lying on your back to sitting on the side of a flat bed without using  bedrails?: A Lot Help needed moving to and from a bed to a chair (including a wheelchair)?: A Lot Help needed standing up from a chair using your arms (e.g., wheelchair or bedside chair)?: A Lot Help needed to walk in hospital room?: Total Help needed climbing 3-5 steps with a railing? : Total 6 Click Score: 10    End of Session Equipment Utilized During Treatment: Gait belt Activity Tolerance: Patient tolerated treatment well;Patient limited by fatigue Patient left: with call bell/phone within reach;in bed;with bed alarm set;with family/visitor present Nurse Communication: Mobility status PT Visit Diagnosis: Unsteadiness on feet (R26.81);Other abnormalities of gait and mobility (R26.89);Muscle weakness (generalized) (M62.81)    Time: 4562-5638 PT Time Calculation (min) (ACUTE ONLY): 45 min   Charges:   PT Evaluation $PT Eval Moderate Complexity: 1 Mod PT Treatments $Therapeutic Activity: 23-37 mins        Floria Raveling. Hartnett-Rands, MS, PT Per Rosa Sanchez #[id number] 08/02/2020, 3:48 PM

## 2020-08-02 NOTE — Progress Notes (Signed)
Initial Nutrition Assessment  DOCUMENTATION CODES:   Moderate malnutrition in context of chronic illness  INTERVENTION:  Milk with all meals  Offer snack and hydration between meals from nourishment room   NUTRITION DIAGNOSIS:   Moderate Malnutrition related to chronic illness (Failure to Thrive, B-cell lymphoma, chronic pain) as evidenced by meal completion < 25%, per patient/family report, mild fat depletion, moderate muscle depletion.   GOAL:   Patient will meet greater than or equal to 90% of their needs (if feasible given patient health problems)  MONITOR:   PO intake, Weight trends, Supplement acceptance, Labs, Skin  REASON FOR ASSESSMENT:   Consult Assessment of nutrition requirement/status  ASSESSMENT: Patient is a 74 yo male with hx of Failure to Thrive, Malnutrition, DM, B-cell lymphoma, vitamin D defieicency and chronic pain. Presents with weakness and pain. Receiving hospice services prior to admission. Patient complaining during RD visit that his heels are hurting.  Poor po intake 20-25% meals. Lunch tray is here untouched. Patient says he may eat something later. He doesn't like Ensure and says "they don't agree with me". Patient is willing to have milk each meal and likes to eat ice cream. When he feels well usual eating pattern is 2 x daily.   Weight the past 6 months has ranged between 80-82 kg. Patient says he used to weigh (91 kg) 200 lb. No significant change noted x 6 months.    NUTRITION - FOCUSED PHYSICAL EXAM: Nutrition-Focused physical exam findings are mild buccal, orbital fat depletion, moderate clavicle, deltoid and interosseous muscle depletion, and no edema.   Patient is at high risk for worsening nutrition status given his minimal intake and dependent state.   Diet Order:   Diet Order            Diet regular Room service appropriate? Yes; Fluid consistency: Thin  Diet effective now                 EDUCATION NEEDS:  Not appropriate  for education at this time Skin:  Skin Assessment: Reviewed RN Assessment  Last BM:  9/25  Height:   Ht Readings from Last 1 Encounters:  07/30/20 6' (1.829 m)    Weight:   Wt Readings from Last 1 Encounters:  07/30/20 80 kg    Ideal Body Weight:   81 kg  BMI:  Body mass index is 23.92 kg/m.  Estimated Nutritional Needs:   Kcal:  2160-2240  Protein:  96-104 gr  Fluid:  2.2-2.2 liters daily  Colman Cater MS,RD,CSG,LDN Pager: Shea Evans

## 2020-08-02 NOTE — Care Management Important Message (Signed)
Important Message  Patient Details  Name: Daniel Richards MRN: [id number] Date of Birth: 24-Jun-1946   Medicare Important Message Given:  Yes     [name] 08/02/2020, 11:12 AM

## 2020-08-02 NOTE — Plan of Care (Signed)
  Problem: Acute Rehab PT Goals(only PT should resolve) Goal: Pt will Roll Supine to Side Outcome: Progressing Flowsheets (Taken 08/02/2020 1556) Pt will Roll Supine to Side: min guard Goal: Pt Will Go Supine/Side To Sit Outcome: Progressing Flowsheets (Taken 08/02/2020 1556) Pt will go Supine/Side to Sit:  with moderate assist  with HOB elevated Goal: Pt Will Go Sit To Supine/Side Outcome: Progressing Flowsheets (Taken 08/02/2020 1556) Pt will go Sit to Supine/Side:  with moderate assist  with HOB  elevated Goal: Patient Will Perform Sitting Balance Outcome: Progressing Flowsheets (Taken 08/02/2020 1556) Patient will perform sitting balance:  with moderate assist  1-2 min  with bilateral UE support Goal: Patient Will Transfer Sit To/From Stand Outcome: Progressing Flowsheets (Taken 08/02/2020 1556) Patient will transfer sit to/from stand:  with moderate assist  from elevated surface Goal: Pt Will Transfer Bed To Chair/Chair To Bed Outcome: Progressing Flowsheets (Taken 08/02/2020 1556) Pt will Transfer Bed to Chair/Chair to Bed:  with mod assist  with +2   Dasja Brase D. Hartnett-Rands, MS, PT Per [place] [phone] 08/02/2020

## 2020-08-02 NOTE — Progress Notes (Signed)
PROGRESS NOTE    Daniel Richards  WJX:[id number] DOB: 19-Jan-1946 DOA: 07/30/2020 PCP: [name], FNP   Brief Narrative:  Patient is a 74 year old male with history of B-cell lymphoma, hypercalcemia, diabetes with neuropathy, depression, history of PE, hypertension, GERD who was recently managed and was discharged from here for mild hypercalcemia, multiple falls to residential hospice of [facility].  He was brought back to the emergency department with complaints of generalized weakness.  Now the plan is to discharge him to skilled nursing facility.  Patient is medically stable for discharge to skilled facility soon as bed is available.  Assessment & Plan:   Principal Problem:   Failure to thrive in adult Active Problems:   Essential tremor   Generalized weakness   Malnutrition of moderate degree   Diabetic peripheral neuropathy (HCC)   Hypertension associated with type 2 diabetes mellitus (HCC)   Diffuse large B cell lymphoma (HCC)   Other specified types of non-hodgkin lymphoma, intrapelvic lymph nodes (HCC)   Chronic pain   Failure to thrive: Due to poor oral intake, severe malnutrition.  Requested   nutrition consult.  This is secondary to his advanced malignancy.  Continue to encourage oral intake.  B-cell lymphoma: Follows with Dr. Delton Coombes.  S/P 6 cycles of chemotherapy.  PET scan on 07/19/2020 showed progressive lymphadenopathy, multifocal bone lesions.  We discussed with Dr. Delton Coombes  and he is planning to continue chemotherapy in the future.  He has a Chemo-Port on the left chest.  CKD stage IIIb: Baseline creatinine ranges from 1.6-1.9.  Currently kidney function at baseline and is improving with IV fluids.  Paroxysmal A. Fib: Found to be tachycardic with EP source of heart rate in the range of 120s.  EKG showed atrial flutter.  No history of A. fib in the past.  Already on Eliquis for anticoagulation.  Started on metoprolol.  We recommend to follow-up  with cardiology as an outpatient.  Will contact cardiology for appointment in [place] [facility]  Elevated TSH: Free T4 normal.  Will start on low-dose Synthyroid for this subclinical hypothyroidism with TSH of more than 10.  Hypercalcemia: Resolved  Back pain/left hip pain: Continue supportive care, pain management.  He has bone lesions from the lymphoma.  Urinary retention: He has chronic indwelling Foley catheter.  History of PE: Diagnosed with PE on 08/2019.  On Eliquis  Diabetes type 2: Controlled.  Last hemoglobin A1c of 6.3 as per 07/27/2020.On farxiga, Metformin.  He is on gabapentin for diabetic neuropathy.  Essential tremor: Has hand tremors.  On Mysoline and propanolol  Depression/anxiety: He was on  Celexa at home,now started on Remeron  Suspected UTI: Denies any dysuria.  He complains of abdomen pain which is chronic.  He has a Foley catheter.  Urinalysis showed leukocytes.  Urine culture showed multiple organisms.  Goals of care: On his last admission, palliative care was consulted.  Goals of care were discussed with the daughter and he was discharged to hospice.  As per my discussion to Dr. Delton Coombes today.  He was not informed about this.  Daughter was not happy because he was transferred to hospice and he was brought back to the hospital.  Currently partial code.  Generalized weakness/falls: Physical therapy consulted. Consulted Education officer, museum for skilled nursing facility placement.         DVT prophylaxis:Eliquis Code Status: partial Family Communication: Called and discussed with the daughter on phone on 08/02/20 Status is: Inpatient  Remains inpatient appropriate because:Unsafe d/c  plan   Dispo: The patient is from: Residential hospice              Anticipated d/c is to: SNF              Anticipated d/c date is: 1 day              Patient currently is medically stable to d/c.      Consultants: None  Procedures:None Antimicrobials:  Anti-infectives (From  admission, onward)   None      Subjective:  Patient seen and examined the bedside this morning.  Hemodynamically stable.  Comfortable.  During my evaluation, he was in normal sinus rhythm.  EKG showed a flutter, denies any complaints  Objective: Vitals:   08/01/20 1247 08/01/20 1947 08/01/20 2124 08/02/20 0300  BP: (!) 138/119 122/67  124/72  Pulse: 87 (!) 112 91 (!) 110  Resp: 18 20  18   Temp: 98.8 F (37.1 C) 98.3 F (36.8 C)  99.4 F (37.4 C)  TempSrc: Oral   Oral  SpO2: 94% 98%  96%  Weight:      Height:        Intake/Output Summary (Last 24 hours) at 08/02/2020 0806 Last data filed at 08/02/2020 0454 Gross per 24 hour  Intake --  Output 1550 ml  Net -1550 ml   Filed Weights   07/30/20 1240 07/30/20 2003  Weight: 80 kg 80 kg    Examination:  General exam: Very deconditioned, debilitated elderly male HEENT:PERRL,Oral mucosa moist, Ear/Nose normal on gross exam Respiratory system: Bilateral equal air entry, normal vesicular breath sounds, no wheezes or crackles  Cardiovascular system: Regular rate and rhythm. No JVD, murmurs, rubs, gallops or clicks.  Chemo-Port on the left chest Gastrointestinal system: Abdomen is nondistended, soft and nontender. No organomegaly or masses felt. Normal bowel sounds heard. Central nervous system: Alert and oriented. No focal neurological deficits. Extremities: No edema, no clubbing ,no cyanosis Skin: No rashes, lesions or ulcers,no icterus ,no pallor  Data Reviewed: I have personally reviewed following labs and imaging studies  CBC: Recent Labs  Lab 07/27/20 0841 07/28/20 0339 07/30/20 1310  WBC 5.8 4.5 7.0  NEUTROABS 3.9  --  5.2  HGB 9.5* 8.2* 8.9*  HCT 30.3* 26.9* 28.9*  MCV 103.4* 103.1* 104.3*  PLT 374 342 272   Basic Metabolic Panel: Recent Labs  Lab 07/27/20 0841 07/27/20 0841 07/28/20 0339 07/30/20 1310 07/31/20 0645 08/01/20 0708 08/02/20 0601  NA 136   < > 137 134* 136 135 136  K 4.6   < > 4.5 3.8  3.5 3.3* 3.5  CL 101   < > 103 101 100 105 105  CO2 26   < > 25 23 21* 19* 20*  GLUCOSE 140*   < > 94 104* 99 108* 127*  BUN 29*   < > 34* 25* 25* 24* 20  CREATININE 1.74*   < > 1.83* 1.78* 1.76* 1.64* 1.52*  CALCIUM 11.7*   < > 11.2* 9.7 9.6 9.1 8.8*  MG 2.0  --   --   --   --   --   --    < > = values in this interval not displayed.   GFR: Estimated Creatinine Clearance: 45.4 mL/min (A) (by C-G formula based on SCr of 1.52 mg/dL (H)). Liver Function Tests: Recent Labs  Lab 07/27/20 0841 07/28/20 0339  AST 16 12*  ALT 14 13  ALKPHOS 111 93  BILITOT 0.4 0.3  PROT 7.6 6.6  ALBUMIN 3.1* 2.9*   No results for input(s): LIPASE, AMYLASE in the last 168 hours. No results for input(s): AMMONIA in the last 168 hours. Coagulation Profile: No results for input(s): INR, PROTIME in the last 168 hours. Cardiac Enzymes: No results for input(s): CKTOTAL, CKMB, CKMBINDEX, TROPONINI in the last 168 hours. BNP (last 3 results) No results for input(s): PROBNP in the last 8760 hours. HbA1C: No results for input(s): HGBA1C in the last 72 hours. CBG: Recent Labs  Lab 07/28/20 1129 07/28/20 1603 07/28/20 2143 07/29/20 0736 07/29/20 1117  GLUCAP 126* 129* 93 110* 122*   Lipid Profile: No results for input(s): CHOL, HDL, LDLCALC, TRIG, CHOLHDL, LDLDIRECT in the last 72 hours. Thyroid Function Tests: No results for input(s): TSH, T4TOTAL, FREET4, T3FREE, THYROIDAB in the last 72 hours. Anemia Panel: No results for input(s): VITAMINB12, FOLATE, FERRITIN, TIBC, IRON, RETICCTPCT in the last 72 hours. Sepsis Labs: No results for input(s): PROCALCITON, LATICACIDVEN in the last 168 hours.  Recent Results (from the past 240 hour(s))  SARS Coronavirus 2 by RT PCR (hospital order, performed in [place] hospital lab)     Status: None   Collection Time: 07/27/20  3:24 PM  Result Value Ref Range Status   SARS Coronavirus 2 NEGATIVE NEGATIVE Final    Comment: (NOTE) SARS-CoV-2 target nucleic  acids are NOT DETECTED.  The SARS-CoV-2 RNA is generally detectable in upper and lower respiratory specimens during the acute phase of infection. The lowest concentration of SARS-CoV-2 viral copies this assay can detect is 250 copies / mL. A negative result does not preclude SARS-CoV-2 infection and should not be used as the sole basis for treatment or other patient management decisions.  A negative result may occur with improper specimen collection / handling, submission of specimen other than nasopharyngeal swab, presence of viral mutation(s) within the areas targeted by this assay, and inadequate number of viral copies (<250 copies / mL). A negative result must be combined with clinical observations, patient history, and epidemiological information.  Fact Sheet for Patients:   StrictlyIdeas.no  Fact Sheet for Healthcare Providers: BankingDealers.co.za  This test is not yet approved or  cleared by the [place] FDA and has been authorized for detection and/or diagnosis of SARS-CoV-2 by FDA under an Emergency Use Authorization (EUA).  This EUA will remain in effect (meaning this test can be used) for the duration of the COVID-19 declaration under Section 564(b)(1) of the Act, 21 U.S.C. section 360bbb-3(b)(1), unless the authorization is terminated or revoked sooner.  Performed at [facility], [address] [place], [place] [address]   MRSA PCR Screening     Status: None   Collection Time: 07/27/20  3:49 PM   Specimen: Nasopharyngeal Swab  Result Value Ref Range Status   MRSA by PCR NEGATIVE NEGATIVE Final    Comment:        The GeneXpert MRSA Assay (FDA approved for NASAL specimens only), is one component of a comprehensive MRSA colonization surveillance program. It is not intended to diagnose MRSA infection nor to guide or monitor treatment for MRSA infections. Performed at [facility], [address]  [place], [place] [address]   Culture, Urine     Status: Abnormal   Collection Time: 07/28/20 12:02 PM   Specimen: Urine, Random  Result Value Ref Range Status   Specimen Description   Final    URINE, RANDOM Performed at [facility], [address] [place],  [address]    Special Requests   Final  NONE Performed at [facility], [address], [place], [place] [address]    Culture MULTIPLE SPECIES PRESENT, SUGGEST RECOLLECTION (A)  Final   Report Status 07/29/2020 FINAL  Final  Respiratory Panel by RT PCR (Flu A&B, Covid) - Nasopharyngeal Swab     Status: None   Collection Time: 07/29/20 12:44 PM   Specimen: Nasopharyngeal Swab  Result Value Ref Range Status   SARS Coronavirus 2 by RT PCR NEGATIVE NEGATIVE Final    Comment: (NOTE) SARS-CoV-2 target nucleic acids are NOT DETECTED.  The SARS-CoV-2 RNA is generally detectable in upper respiratoy specimens during the acute phase of infection. The lowest concentration of SARS-CoV-2 viral copies this assay can detect is 131 copies/mL. A negative result does not preclude SARS-Cov-2 infection and should not be used as the sole basis for treatment or other patient management decisions. A negative result may occur with  improper specimen collection/handling, submission of specimen other than nasopharyngeal swab, presence of viral mutation(s) within the areas targeted by this assay, and inadequate number of viral copies (<131 copies/mL). A negative result must be combined with clinical observations, patient history, and epidemiological information. The expected result is Negative.  Fact Sheet for Patients:  PinkCheek.be  Fact Sheet for Healthcare Providers:  GravelBags.it  This test is no t yet approved or cleared by the [place] FDA and  has been authorized for detection and/or diagnosis of SARS-CoV-2 by FDA under an Emergency Use Authorization (EUA). This EUA will  remain  in effect (meaning this test can be used) for the duration of the COVID-19 declaration under Section 564(b)(1) of the Act, 21 U.S.C. section 360bbb-3(b)(1), unless the authorization is terminated or revoked sooner.     Influenza A by PCR NEGATIVE NEGATIVE Final   Influenza B by PCR NEGATIVE NEGATIVE Final    Comment: (NOTE) The Xpert Xpress SARS-CoV-2/FLU/RSV assay is intended as an aid in  the diagnosis of influenza from Nasopharyngeal swab specimens and  should not be used as a sole basis for treatment. Nasal washings and  aspirates are unacceptable for Xpert Xpress SARS-CoV-2/FLU/RSV  testing.  Fact Sheet for Patients: PinkCheek.be  Fact Sheet for Healthcare Providers: GravelBags.it  This test is not yet approved or cleared by the [place] FDA and  has been authorized for detection and/or diagnosis of SARS-CoV-2 by  FDA under an Emergency Use Authorization (EUA). This EUA will remain  in effect (meaning this test can be used) for the duration of the  Covid-19 declaration under Section 564(b)(1) of the Act, 21  U.S.C. section 360bbb-3(b)(1), unless the authorization is  terminated or revoked. Performed at [facility], [address], [place], [place] [address]   Resp Panel by RT PCR (RSV, Flu A&B, Covid) - Nasopharyngeal Swab     Status: None   Collection Time: 07/30/20  2:41 PM   Specimen: Nasopharyngeal Swab  Result Value Ref Range Status   SARS Coronavirus 2 by RT PCR NEGATIVE NEGATIVE Final    Comment: (NOTE) SARS-CoV-2 target nucleic acids are NOT DETECTED.  The SARS-CoV-2 RNA is generally detectable in upper respiratoy specimens during the acute phase of infection. The lowest concentration of SARS-CoV-2 viral copies this assay can detect is 131 copies/mL. A negative result does not preclude SARS-Cov-2 infection and should not be used as the sole basis for treatment or other patient management  decisions. A negative result may occur with  improper specimen collection/handling, submission of specimen other than nasopharyngeal swab, presence of viral mutation(s) within the areas targeted  by this assay, and inadequate number of viral copies (<131 copies/mL). A negative result must be combined with clinical observations, patient history, and epidemiological information. The expected result is Negative.  Fact Sheet for Patients:  PinkCheek.be  Fact Sheet for Healthcare Providers:  GravelBags.it  This test is no t yet approved or cleared by the [place] FDA and  has been authorized for detection and/or diagnosis of SARS-CoV-2 by FDA under an Emergency Use Authorization (EUA). This EUA will remain  in effect (meaning this test can be used) for the duration of the COVID-19 declaration under Section 564(b)(1) of the Act, 21 U.S.C. section 360bbb-3(b)(1), unless the authorization is terminated or revoked sooner.     Influenza A by PCR NEGATIVE NEGATIVE Final   Influenza B by PCR NEGATIVE NEGATIVE Final    Comment: (NOTE) The Xpert Xpress SARS-CoV-2/FLU/RSV assay is intended as an aid in  the diagnosis of influenza from Nasopharyngeal swab specimens and  should not be used as a sole basis for treatment. Nasal washings and  aspirates are unacceptable for Xpert Xpress SARS-CoV-2/FLU/RSV  testing.  Fact Sheet for Patients: PinkCheek.be  Fact Sheet for Healthcare Providers: GravelBags.it  This test is not yet approved or cleared by the [place] FDA and  has been authorized for detection and/or diagnosis of SARS-CoV-2 by  FDA under an Emergency Use Authorization (EUA). This EUA will remain  in effect (meaning this test can be used) for the duration of the  Covid-19 declaration under Section 564(b)(1) of the Act, 21  U.S.C. section 360bbb-3(b)(1), unless the  authorization is  terminated or revoked.    Respiratory Syncytial Virus by PCR NEGATIVE NEGATIVE Final    Comment: (NOTE) Fact Sheet for Patients: PinkCheek.be  Fact Sheet for Healthcare Providers: GravelBags.it  This test is not yet approved or cleared by the [place] FDA and  has been authorized for detection and/or diagnosis of SARS-CoV-2 by  FDA under an Emergency Use Authorization (EUA). This EUA will remain  in effect (meaning this test can be used) for the duration of the  COVID-19 declaration under Section 564(b)(1) of the Act, 21 U.S.C.  section 360bbb-3(b)(1), unless the authorization is terminated or  revoked. Performed at [facility], [address], [place], [place] [address]   Urine culture     Status: Abnormal   Collection Time: 07/30/20  4:31 PM   Specimen: Urine, Clean Catch  Result Value Ref Range Status   Specimen Description   Final    URINE, CLEAN CATCH Performed at [facility], [address], [place], [place] [address]    Special Requests   Final    NONE Performed at [facility], [address] [place], [place] [address]    Culture MULTIPLE SPECIES PRESENT, SUGGEST RECOLLECTION (A)  Final   Report Status 08/01/2020 FINAL  Final         Radiology Studies: No results found.      Scheduled Meds: . apixaban  5 mg Oral BID  . Chlorhexidine Gluconate Cloth  6 each Topical Daily  . cyclobenzaprine  10 mg Oral QHS  . dapagliflozin propanediol  10 mg Oral Daily  . feeding supplement (ENSURE ENLIVE)  237 mL Oral BID BM  . gabapentin  600 mg Oral TID  . guaiFENesin-dextromethorphan  5 mL Oral Q6H  . levothyroxine  50 mcg Oral Q0600  . mirtazapine  30 mg Oral QHS  . potassium chloride  40 mEq Oral BID  . primidone  100 mg Oral BID  .  propranolol  40 mg Oral BID  . temazepam  15 mg Oral QHS  . topiramate  200 mg Oral QHS   Continuous Infusions: . sodium chloride       LOS: 3  days    Time spent: 35 mins.More than 50% of that time was spent in counseling and/or coordination of care.      Shelly Coss, MD [facility] P9/27/2021, 8:06 AM

## 2020-08-03 ENCOUNTER — Other Ambulatory Visit (HOSPITAL_COMMUNITY): Payer: Medicare Other | Admitting: General Practice

## 2020-08-03 ENCOUNTER — Ambulatory Visit (HOSPITAL_COMMUNITY): Payer: Medicare Other | Admitting: Hematology

## 2020-08-03 ENCOUNTER — Ambulatory Visit (HOSPITAL_COMMUNITY): Payer: Medicare Other

## 2020-08-03 ENCOUNTER — Other Ambulatory Visit (HOSPITAL_COMMUNITY): Payer: Medicare Other

## 2020-08-03 ENCOUNTER — Encounter (HOSPITAL_COMMUNITY): Payer: Self-pay

## 2020-08-03 ENCOUNTER — Inpatient Hospital Stay (HOSPITAL_COMMUNITY): Payer: Medicare Other

## 2020-08-03 VITALS — BP 111/74 | HR 126 | Temp 98.4°F | Resp 18

## 2020-08-03 DIAGNOSIS — G893 Neoplasm related pain (acute) (chronic): Secondary | ICD-10-CM

## 2020-08-03 DIAGNOSIS — R531 Weakness: Secondary | ICD-10-CM

## 2020-08-03 DIAGNOSIS — Z95828 Presence of other vascular implants and grafts: Secondary | ICD-10-CM

## 2020-08-03 DIAGNOSIS — C8336 Diffuse large B-cell lymphoma, intrapelvic lymph nodes: Secondary | ICD-10-CM

## 2020-08-03 DIAGNOSIS — C8338 Diffuse large B-cell lymphoma, lymph nodes of multiple sites: Secondary | ICD-10-CM

## 2020-08-03 DIAGNOSIS — C8513 Unspecified B-cell lymphoma, intra-abdominal lymph nodes: Secondary | ICD-10-CM

## 2020-08-03 LAB — RESPIRATORY PANEL BY RT PCR (FLU A&B, COVID)
Influenza A by PCR: NEGATIVE
Influenza B by PCR: NEGATIVE
SARS Coronavirus 2 by RT PCR: NEGATIVE

## 2020-08-03 LAB — CBC WITH DIFFERENTIAL/PLATELET
Abs Immature Granulocytes: 0.05 10*3/uL (ref 0.00–0.07)
Abs Immature Granulocytes: 0.07 10*3/uL (ref 0.00–0.07)
Basophils Absolute: 0 10*3/uL (ref 0.0–0.1)
Basophils Absolute: 0 10*3/uL (ref 0.0–0.1)
Basophils Relative: 1 %
Basophils Relative: 0 %
Eosinophils Absolute: 0.1 10*3/uL (ref 0.0–0.5)
Eosinophils Absolute: 0.1 10*3/uL (ref 0.0–0.5)
Eosinophils Relative: 2 %
Eosinophils Relative: 2 %
HCT: 28 % — ABNORMAL LOW (ref 39.0–52.0)
HCT: 27.7 % — ABNORMAL LOW (ref 39.0–52.0)
Hemoglobin: 8.3 g/dL — ABNORMAL LOW (ref 13.0–17.0)
Hemoglobin: 8.6 g/dL — ABNORMAL LOW (ref 13.0–17.0)
Immature Granulocytes: 1 %
Immature Granulocytes: 1 %
Lymphocytes Relative: 11 %
Lymphocytes Relative: 12 %
Lymphs Abs: 0.6 10*3/uL — ABNORMAL LOW (ref 0.7–4.0)
Lymphs Abs: 0.7 10*3/uL (ref 0.7–4.0)
MCH: 31 pg (ref 26.0–34.0)
MCH: 32.1 pg (ref 26.0–34.0)
MCHC: 29.6 g/dL — ABNORMAL LOW (ref 30.0–36.0)
MCHC: 31 g/dL (ref 30.0–36.0)
MCV: 104.5 fL — ABNORMAL HIGH (ref 80.0–100.0)
MCV: 103.4 fL — ABNORMAL HIGH (ref 80.0–100.0)
Monocytes Absolute: 0.8 10*3/uL (ref 0.1–1.0)
Monocytes Absolute: 0.8 10*3/uL (ref 0.1–1.0)
Monocytes Relative: 16 %
Monocytes Relative: 14 %
Neutro Abs: 3.5 10*3/uL (ref 1.7–7.7)
Neutro Abs: 4 10*3/uL (ref 1.7–7.7)
Neutrophils Relative %: 69 %
Neutrophils Relative %: 71 %
Platelets: 282 10*3/uL (ref 150–400)
Platelets: 317 10*3/uL (ref 150–400)
RBC: 2.68 MIL/uL — ABNORMAL LOW (ref 4.22–5.81)
RBC: 2.68 MIL/uL — ABNORMAL LOW (ref 4.22–5.81)
RDW: 14.2 % (ref 11.5–15.5)
RDW: 14.3 % (ref 11.5–15.5)
WBC: 5 10*3/uL (ref 4.0–10.5)
WBC: 5.6 10*3/uL (ref 4.0–10.5)
nRBC: 0 % (ref 0.0–0.2)
nRBC: 0 % (ref 0.0–0.2)

## 2020-08-03 LAB — MAGNESIUM: Magnesium: 2 mg/dL (ref 1.7–2.4)

## 2020-08-03 LAB — COMPREHENSIVE METABOLIC PANEL
ALT: 39 U/L (ref 0–44)
AST: 58 U/L — ABNORMAL HIGH (ref 15–41)
Albumin: 2.6 g/dL — ABNORMAL LOW (ref 3.5–5.0)
Alkaline Phosphatase: 154 U/L — ABNORMAL HIGH (ref 38–126)
Anion gap: 10 (ref 5–15)
BUN: 18 mg/dL (ref 8–23)
CO2: 18 mmol/L — ABNORMAL LOW (ref 22–32)
Calcium: 9.2 mg/dL (ref 8.9–10.3)
Chloride: 109 mmol/L (ref 98–111)
Creatinine, Ser: 1.48 mg/dL — ABNORMAL HIGH (ref 0.61–1.24)
GFR calc Af Amer: 53 mL/min — ABNORMAL LOW (ref >60)
GFR calc non Af Amer: 45 mL/min — ABNORMAL LOW (ref >60)
Glucose, Bld: 104 mg/dL — ABNORMAL HIGH (ref 70–99)
Potassium: 4 mmol/L (ref 3.5–5.1)
Sodium: 137 mmol/L (ref 135–145)
Total Bilirubin: 0.9 mg/dL (ref 0.3–1.2)
Total Protein: 6.8 g/dL (ref 6.5–8.1)

## 2020-08-03 LAB — HEPATIC FUNCTION PANEL
ALT: 37 U/L (ref 0–44)
AST: 52 U/L — ABNORMAL HIGH (ref 15–41)
Albumin: 2.5 g/dL — ABNORMAL LOW (ref 3.5–5.0)
Alkaline Phosphatase: 150 U/L — ABNORMAL HIGH (ref 38–126)
Bilirubin, Direct: 0.1 mg/dL (ref 0.0–0.2)
Indirect Bilirubin: 0.5 mg/dL (ref 0.3–0.9)
Total Bilirubin: 0.6 mg/dL (ref 0.3–1.2)
Total Protein: 6.6 g/dL (ref 6.5–8.1)

## 2020-08-03 LAB — BASIC METABOLIC PANEL
Anion gap: 9 (ref 5–15)
BUN: 18 mg/dL (ref 8–23)
CO2: 18 mmol/L — ABNORMAL LOW (ref 22–32)
Calcium: 8.8 mg/dL — ABNORMAL LOW (ref 8.9–10.3)
Chloride: 109 mmol/L (ref 98–111)
Creatinine, Ser: 1.37 mg/dL — ABNORMAL HIGH (ref 0.61–1.24)
GFR calc Af Amer: 58 mL/min — ABNORMAL LOW (ref >60)
GFR calc non Af Amer: 50 mL/min — ABNORMAL LOW (ref >60)
Glucose, Bld: 96 mg/dL (ref 70–99)
Potassium: 4 mmol/L (ref 3.5–5.1)
Sodium: 136 mmol/L (ref 135–145)

## 2020-08-03 LAB — URIC ACID: Uric Acid, Serum: 3.3 mg/dL — ABNORMAL LOW (ref 3.7–8.6)

## 2020-08-03 LAB — LACTATE DEHYDROGENASE: LDH: 263 U/L — ABNORMAL HIGH (ref 98–192)

## 2020-08-03 MED ORDER — TAFASITAMAB-CXIX CHEMO INJECTION 200 MG
12.0000 mg/kg | Freq: Once | INTRAVENOUS | Status: AC
  Administered 2020-08-03: 1000 mg via INTRAVENOUS
  Filled 2020-08-03: qty 25

## 2020-08-03 MED ORDER — CYCLOBENZAPRINE HCL 10 MG PO TABS: 10.0000 mg | ORAL_TABLET | Freq: Every day | ORAL | 0 refills | Status: AC

## 2020-08-03 MED ORDER — MIRTAZAPINE 30 MG PO TABS: 30.0000 mg | ORAL_TABLET | Freq: Every day | ORAL | Status: AC

## 2020-08-03 MED ORDER — METOPROLOL SUCCINATE ER 50 MG PO TB24: 50.0000 mg | ORAL_TABLET | Freq: Every day | ORAL | Status: DC

## 2020-08-03 MED ORDER — POTASSIUM CHLORIDE 20 MEQ PO PACK
20.0000 meq | PACK | Freq: Every day | ORAL | Status: DC
  Administered 2020-08-03: 20 meq via ORAL
  Filled 2020-08-03: qty 1

## 2020-08-03 MED ORDER — TEMAZEPAM 15 MG PO CAPS: 15.0000 mg | ORAL_CAPSULE | Freq: Every day | ORAL | 0 refills | Status: AC

## 2020-08-03 MED ORDER — TEMAZEPAM 15 MG PO CAPS
15.0000 mg | ORAL_CAPSULE | Freq: Every day | ORAL | 0 refills | Status: DC
Start: 2020-08-03 — End: 2020-08-03

## 2020-08-03 MED ORDER — DIPHENHYDRAMINE HCL 50 MG/ML IJ SOLN
50.0000 mg | Freq: Once | INTRAMUSCULAR | Status: AC
  Administered 2020-08-03: 50 mg via INTRAVENOUS

## 2020-08-03 MED ORDER — SODIUM CHLORIDE 0.9 % IV SOLN: Freq: Once | INTRAVENOUS | Status: AC

## 2020-08-03 MED ORDER — METOPROLOL SUCCINATE ER 50 MG PO TB24
50.0000 mg | ORAL_TABLET | Freq: Every day | ORAL | Status: DC
  Administered 2020-08-03: 50 mg via ORAL
  Filled 2020-08-03: qty 1

## 2020-08-03 MED ORDER — ACETAMINOPHEN 325 MG PO TABS
650.0000 mg | ORAL_TABLET | Freq: Once | ORAL | Status: AC
  Administered 2020-08-03: 650 mg via ORAL

## 2020-08-03 MED ORDER — SODIUM CHLORIDE 0.9 % IV SOLN
20.0000 mg | Freq: Once | INTRAVENOUS | Status: AC
  Administered 2020-08-03: 20 mg via INTRAVENOUS
  Filled 2020-08-03: qty 20

## 2020-08-03 MED ORDER — METOPROLOL SUCCINATE ER 25 MG PO TB24: 75.0000 mg | ORAL_TABLET | Freq: Every day | ORAL | Status: DC

## 2020-08-03 MED ORDER — POTASSIUM CHLORIDE 20 MEQ PO PACK: 20.0000 meq | PACK | Freq: Every day | ORAL | Status: AC

## 2020-08-03 MED ORDER — FAMOTIDINE IN NACL 20-0.9 MG/50ML-% IV SOLN
20.0000 mg | Freq: Once | INTRAVENOUS | Status: AC
  Administered 2020-08-03: 20 mg via INTRAVENOUS

## 2020-08-03 NOTE — Progress Notes (Signed)
Discharge AVS sent in discharge packet to [facility], called report to receiving nurse, Flo at facility. Patient left floor in stable condition via EMS transport. Family at bedside prior to discharge and aware of discharge plan.

## 2020-08-03 NOTE — TOC Transition Note (Addendum)
Transition of Care [facility]) - CM/SW Discharge Note   Patient Details  Name: Daniel Richards MRN: [id number] Date of Birth: [birth year]  [facility] CM/SW Contact:  Iona Beard, [place] Phone Number: 08/03/2020, 2:01 PM   Clinical Narrative:    CSW spoke with Ebony Hail from [facility] to confirm bed available for pt. Ebony Hail confirmed bed would be 415-2. CSW called to schedule EMS. CSW informed pts daughter [name] of pts discharge to [facility] this evening. CSW sent clinical documents to [facility]. MD and RN updated. Medical necessity form provided to nurse. TOC signing off    Final next level of care: [place] Barriers to Discharge: Barriers Resolved   Patient Goals and CMS Choice Patient states their goals for this hospitalization and ongoing recovery are:: Go to [facility] for rehab. CMS Medicare.gov Compare Post Acute Care list provided to:: Patient Choice offered to / list presented to : Patient  Discharge Placement              Patient chooses bed at: [facility] Patient to be transferred to facility by: EMS Name of family member notified: [name] Patient and family notified of of transfer: 08/03/20  Discharge Plan and Services                DME Arranged: N/A DME Agency: NA       HH Arranged: NA HH Agency: NA        Social Determinants of Health (SDOH) Interventions     Readmission Risk Interventions Readmission Risk Prevention Plan 01/18/2020 09/30/2019 07/30/2019  Post Dischage Appt - - Not Complete  Appt Comments - - [facility] MD will follow while pt at short term rehab  Medication Screening - - -  Transportation Screening Complete Complete -  Medication Review Press photographer) Complete Complete -  PCP or Specialist appointment within 3-5 days of discharge - Not Complete -  [place] or Home Care Consult Complete Complete -  SW Recovery Care/Counseling Consult Complete Complete -  Palliative Care Screening Not Applicable Not Complete -   [place] Not Applicable Not Complete -  Some recent data might be hidden

## 2020-08-03 NOTE — Consult Note (Signed)
[facility] Consultation Oncology  Name: Daniel Richards      MRN: [id number]    Location: A303/A303-01  Date: 08/03/2020 Time:7:40 AM   REFERRING PHYSICIAN: Dr. 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  He was initially diagnosed with large B-cell lymphoma a year ago and was treated with 6 cycles of R mini CHOP.  He had recurrent disease in July 2021.  He was started on second line treatment with Monjuvi and Revlimid.  He has not started Revlimid tablets yet.  He has not experienced any side effects from [facility].  He was discharged to hospice home but changed his mind and wanted to pursue active treatment.  He has pain in the left hip and weakness in the left leg from his lymphoma.  His ambulation is limited because of this.  He also had hypercalcemia which was treated with Zometa.  His calcium levels have come down to normal.  He is in the process of being discharged to rehab facility.  PAST MEDICAL HISTORY:   Past Medical History:  Diagnosis Date  . Acid reflux   . Anxiety and depression   . Cancer ([place])    lymphoma  . Depression   . Diabetes ([place])   . Diabetes mellitus 06/09/2011   pt taking metformin  . DJD of shoulder    right   . Hyperlipidemia   . Hypertension   . Lymphoma ([place])   . Migraines   . Port-A-Cath in place 08/13/2019  . Pulmonary embolism ([place])   . Stomach ulcer   . Tremor   . Tremor   . Vitamin D deficiency     ALLERGIES: Allergies  Allergen Reactions  . Vancomycin Hives    Patient received Benadryl to treat the hives      MEDICATIONS: I have reviewed the patient's current medications.     PAST SURGICAL HISTORY Past Surgical History:  Procedure Laterality Date  . BACK SURGERY    . KNEE SURGERY    .  PORTACATH PLACEMENT Left 08/08/2019   Procedure: INSERTION PORT-A-CATH;  Surgeon: Aviva Signs, MD;  Location: [facility];  Service: General;  Laterality: Left;  . SHOULDER SURGERY      FAMILY HISTORY: Family History  Problem Relation Age of Onset  . Cancer Sister   . Cancer Sister   . Cancer Brother   . Heart attack Father   . Cancer Brother   . Heart attack Brother   . Healthy Son   . Healthy Daughter   . Migraines Neg Hx     SOCIAL HISTORY:  reports that he quit smoking about 30 years ago. His smoking use included cigarettes. He has a 40.00 pack-year smoking history. He has never used smokeless tobacco. He reports that he does not drink alcohol and does not use drugs.  PERFORMANCE STATUS: The patient's performance status is 3 - Symptomatic, >50% confined to bed  PHYSICAL EXAM: Most Recent Vital Signs: Blood pressure 135/85, pulse (!) 133, temperature 99.6 F (37.6 C), resp. rate 20, height 6' (1.829 m), weight 176 lb 5.9 oz (80 kg), SpO2 98 %. BP 135/85 (BP Location: Right Arm)   Pulse (!) 133   Temp 99.6 F (37.6 C)   Resp 20   Ht 6' (1.829 m)   Wt 176 lb  5.9 oz (80 kg)   SpO2 98%   BMI 23.92 kg/m  General appearance: alert, cooperative and appears stated age Lungs: Bilateral air entry. Abdomen: Soft, nontender, no palpable organomegaly. Extremities: No edema or cyanosis. Skin: No skin rashes or ecchymosis. Neurologic: Grossly normal  LABORATORY DATA:  Results for orders placed or performed during the hospital encounter of 07/30/20 (from the past 48 hour(s))  Basic metabolic panel     Status: Abnormal   Collection Time: 08/02/20  6:01 AM  Result Value Ref Range   Sodium 136 135 - 145 mmol/L   Potassium 3.5 3.5 - 5.1 mmol/L   Chloride 105 98 - 111 mmol/L   CO2 20 (L) 22 - 32 mmol/L   Glucose, Bld 127 (H) 70 - 99 mg/dL    Comment: Glucose reference range applies only to samples taken after fasting for at least 8 hours.   BUN 20 8 - 23 mg/dL   Creatinine, Ser  1.52 (H) 0.61 - 1.24 mg/dL   Calcium 8.8 (L) 8.9 - 10.3 mg/dL   GFR calc non Af Amer 44 (L) >60 mL/min   GFR calc Af Amer 51 (L) >60 mL/min   Anion gap 11 5 - 15    Comment: Performed at [facility], [address] [place], [place] [address]  Basic metabolic panel     Status: Abnormal   Collection Time: 08/03/20  2:52 AM  Result Value Ref Range   Sodium 136 135 - 145 mmol/L   Potassium 4.0 3.5 - 5.1 mmol/L   Chloride 109 98 - 111 mmol/L   CO2 18 (L) 22 - 32 mmol/L   Glucose, Bld 96 70 - 99 mg/dL    Comment: Glucose reference range applies only to samples taken after fasting for at least 8 hours.   BUN 18 8 - 23 mg/dL   Creatinine, Ser 1.37 (H) 0.61 - 1.24 mg/dL   Calcium 8.8 (L) 8.9 - 10.3 mg/dL   GFR calc non Af Amer 50 (L) >60 mL/min   GFR calc Af Amer 58 (L) >60 mL/min   Anion gap 9 5 - 15    Comment: Performed at [facility], [address], [place], [place] [address]      RADIOGRAPHY: No results found.      ASSESSMENT and PLAN:  1.  Recurrent stage IV DLBCL: -Initial diagnosis in September 2020, underwent 6 cycles of R mini CHOP from 08/21/2019 through 01/21/2020. -Recurrence of lymphoma on 05/12/2020. -Started on second line therapy with Monjuvi and Revlimid on 07/27/2020. -Missed day for dose of Monjuvi on 07/30/2020. -Hypercalcemia has resolved.  Calcium level today is 8.8. -We'll check his CBC.  If the counts are adequate, he will receive next infusion today. -He will be discharged to rehab facility later today.  2.  Pulmonary embolism: -Continue Xarelto.  No bleeding issues.  3.  Left hip pain with weakness in the left lower extremity: -This is from recurrent lymphoma.  Continue hydrocodone as needed for pain. -To receive some rehab at the facility.  4.  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  Status post Zometa on 07/27/2020.  Calcium today is 8.8.  5.  CKD: -He has a chronic indwelling catheter. -His creatinine improved to 1.37 today.  All  questions were answered. The patient knows to call the clinic with any problems, questions or concerns. We can certainly see the patient much sooner if necessary.    Derek Jack

## 2020-08-03 NOTE — Progress Notes (Signed)
Labs and VS reviewed with Dr. Delton Coombes.  Okay to proceed with Monjuvi D4, C1 per MD.   Tolerated infusion w/o adverse reaction.  Alert, in no distress.  VSS.  Discharged via wheelchair in stable condition back to unit 300 room 303.  Report given to Thayer Jew.

## 2020-08-03 NOTE — Progress Notes (Signed)
   08/03/20 0524  Assess: MEWS Score  Temp 99.6 F (37.6 C)  BP 135/85  Pulse Rate (!) 133  Resp 20  Level of Consciousness Alert  SpO2 98 %  O2 Device Room Air  Assess: MEWS Score  MEWS Temp 0  MEWS Systolic 0  MEWS Pulse 3  MEWS RR 0  MEWS LOC 0  MEWS Score 3  MEWS Score Color Yellow  Assess: if the MEWS score is Yellow or Red  Were vital signs taken at a resting state? Yes  Focused Assessment No change from prior assessment  Early Detection of Sepsis Score *See Row Information* Low  MEWS guidelines implemented *See Row Information* Yes  Notify: Charge Nurse/RN  Name of Charge Nurse/RN Notified Jocob Dambach   Date Charge Nurse/RN Notified 08/03/20  Time Charge Nurse/RN Notified [id number]

## 2020-08-03 NOTE — Progress Notes (Signed)
Received call from patient's daughter regarding his infusion appointment this am in the [facility]. Spoke with Diane in the cancer center, states they will be working him in for an appointment later today after lunch. They will notify nursing when they are planning to pick him up for the appointment.

## 2020-08-03 NOTE — Discharge Summary (Addendum)
Physician Discharge Summary  MARINO ROGERSON GHW:[id number] DOB: 16-Aug-1946 DOA: 07/30/2020  PCP: [name], FNP  Admit date: 07/30/2020 Discharge date: 08/03/2020  [facility] Disposition:  SNF  Discharge Condition:Stable CODE STATUS:Partial Diet recommendation:  Regular  Brief/Interim Summary: Patient is a 74 year old male with history of B-cell lymphoma, hypercalcemia, diabetes with neuropathy, depression, history of PE, hypertension, GERD who was recently managed and was discharged from here for mild hypercalcemia, multiple falls to residential hospice of [facility].  He was brought back to the emergency department with complaints of generalized weakness.    PT/OT recommended skilled services on discharge.  He is medically stable for to discharge  to skilled nursing facility today.  Following problems were addressed during his hospitalization:  Failure to thrive: Due to poor oral intake, severe malnutrition.  Requested   nutrition consult.  This is secondary to his advanced malignancy.  Continue to encourage oral intake.  B-cell lymphoma: Follows with Dr. Delton Coombes.  S/P 6 cycles of chemotherapy.  PET scan on 07/19/2020 showed progressive lymphadenopathy, multifocal bone lesions.  We discussed with Dr. Delton Coombes  and he is planning to continue chemotherapy in the future.  He has a Chemo-Port on the left chest.  CKD stage IIIb: Baseline creatinine ranges from 1.6-1.9.  Currently kidney function at baseline .  Paroxysmal A. Fib: Remains tachycardiac with HR ranging from 90-120s.  EKG showed atrial flutter.  No history of A. fib in the past.  Already on Eliquis for anticoagulation.  Started on metoprolol.  We recommend to follow-up with cardiology as an outpatient. He will be called for appointment in A. fib clinic  Elevated TSH: Free T4 normal.  Started  on low-dose Synthyroid but discontinued due to finding of new Afib and tachycardia.  Repeat  thyroid function panel in 4 weeks.  Hypercalcemia: Resolved  Back pain/left hip pain: Continue supportive care, pain management.  He has bone lesions from the lymphoma.  Urinary retention: He has chronic indwelling Foley catheter.  History of PE: Diagnosed with PE on 08/2019.  On Eliquis  Diabetes type 2: Controlled.  Last hemoglobin A1c of 6.3 as per 07/27/2020.On farxiga, Metformin.  He is on gabapentin for diabetic neuropathy.  Essential tremor: Has hand tremors.  On Mysoline   Depression/anxiety: He was on  Celexa at home,now started on Remeron  Suspected UTI: Denies any dysuria.  He complains of abdomen pain which is chronic.  He has a Foley catheter.  Urinalysis showed leukocytes.  Urine culture showed multiple organisms.  Goals of care: On his last admission, palliative care was consulted,goals of care were discussed with the daughter and he was discharged to hospice. Currently partial code.  Plan is to continue chemotherapy.  Generalized weakness/falls: Physical therapy consulted and recommended SNF    Discharge Diagnoses:  Principal Problem:   Failure to thrive in adult Active Problems:   Essential tremor   Generalized weakness   Malnutrition of moderate degree   Diabetic peripheral neuropathy ([place])   Hypertension associated with type 2 diabetes mellitus ([place])   Diffuse large B cell lymphoma (HCC)   Other specified types of non-hodgkin lymphoma, intrapelvic lymph nodes (HCC)   Chronic pain    Discharge Instructions  Discharge Instructions    Diet general   Complete by: As directed    Discharge instructions   Complete by: As directed    1)Please take prescribed medications as instructed. 2)You will be called by atrial fibrillation clinic for the follow-up appointment. 3)Do a  CBC and Bmp test in a week   Increase activity slowly   Complete by: As directed      Allergies as of 08/03/2020      Reactions   Vancomycin Hives   Patient received  Benadryl to treat the hives      Medication List    STOP taking these medications   citalopram 40 MG tablet Commonly known as: CELEXA   propranolol 40 MG tablet Commonly known as: INDERAL     TAKE these medications   allopurinol 300 MG tablet Commonly known as: ZYLOPRIM Take 300 mg by mouth daily.   cyclobenzaprine 10 MG tablet Commonly known as: FLEXERIL Take 1 tablet (10 mg total) by mouth at bedtime. What changed:   when to take this  reasons to take this   dapagliflozin propanediol 10 MG Tabs tablet Commonly known as: FARXIGA Take 10 mg by mouth daily.   Eliquis 5 MG Tabs tablet Generic drug: apixaban Take 5 mg by mouth 2 (two) times daily.   gabapentin 300 MG capsule Commonly known as: NEURONTIN Take 600 mg by mouth 3 (three) times daily.   metFORMIN 500 MG tablet Commonly known as: GLUCOPHAGE Take 500 mg by mouth 2 (two) times daily with a meal.   metoprolol succinate 25 MG 24 hr tablet Commonly known as: TOPROL-XL Take 3 tablets (75 mg total) by mouth daily. Take with or immediately following a meal. Start taking on: August 04, 2020   mirtazapine 30 MG tablet Commonly known as: REMERON Take 1 tablet (30 mg total) by mouth at bedtime.   pantoprazole 40 MG tablet Commonly known as: PROTONIX Take 40 mg by mouth daily.   potassium chloride 20 MEQ packet Commonly known as: KLOR-CON Take 20 mEq by mouth daily. Start taking on: August 04, 2020   primidone 50 MG tablet Commonly known as: MYSOLINE Take 1.5 tablets (75 mg total) by mouth every 8 (eight) hours. What changed:   how much to take  when to take this   temazepam 15 MG capsule Commonly known as: RESTORIL Take 1 capsule (15 mg total) by mouth at bedtime.   topiramate 200 MG tablet Commonly known as: TOPAMAX Take 200 mg by mouth at bedtime.       Follow-up Information    [name], FNP. Schedule an appointment as soon as possible for a visit in 1 week(s).    Specialty: Family Medicine Contact information: [place] [address] [place] [address] [place] Follow up on 08/09/2020.   Specialty: Cardiology Why: Please arrive 15 minutes early for your 1:30pm Afib clinic appointment with Roderic Palau, NP. The parking code is 3009. Please call the office if you need directions Contact information: [address] [id number] [place] [address] [phone]             Allergies  Allergen Reactions  . Vancomycin Hives    Patient received Benadryl to treat the hives    Consultations:  Oncology   Procedures/Studies: MR PELVIS W WO CONTRAST  Addendum Date: 07/19/2020   ADDENDUM REPORT: 07/19/2020 16:24 ADDENDUM: Dictation error in the musculoskeletal findings section above. Multiple lesions in the bilateral bony pelvis and proximal FEMURS, measuring up to 9 mm on the left, suggesting metastases/lymphomatous involvement. Remainder of the report, including the impression, is unchanged. Electronically Signed   By: Julian Hy M.D.   On:  07/19/2020 16:24   Result Date: 07/19/2020 CLINICAL DATA:  Diffuse large B-cell lymphoma, pelvic lymphadenopathy EXAM: MRI PELVIS WITHOUT AND WITH CONTRAST TECHNIQUE: Multiplanar multisequence MR imaging of the pelvis was performed both before and after administration of intravenous contrast. CONTRAST:  36mL GADAVIST GADOBUTROL 1 MMOL/ML IV SOLN COMPARISON:  MRI pelvis dated 05/31/2020 FINDINGS: Urinary Tract: Irregular bladder wall thickening. Indwelling Foley catheter. Bowel: Wall thickening involving the left posterolateral rectum (series 15/image 29), new from the prior, likely reflecting tumor involvement. Vascular/Lymphatic: No evidence of aneurysm. Multifocal tumor along the left posterior pelvic sidewall. In aggregate, this measures 6.0 x 2.3 cm (series 11/image 22), previously 6.2 x  4.4 cm, improved. However, this likely reflects several smaller discrete nodes, including a 16 mm short axis node superiorly (series 12/image 19) and inferiorly (series 12/image 21). Tumor continues to involve the exiting S1-3 nerve roots on the left, particularly S2. New 15 mm short axis left external iliac node (series 12/image 20). New 11 mm short axis left deep inguinal node (series 12/image 23). Retroperitoneal lymphadenopathy, incompletely visualized, including a 2.0 cm short axis right common iliac node and a 1.9 cm short axis left common iliac node (series 12/image 8). Reproductive:  Prostatomegaly, suggesting BPH. Other:  No pelvic ascites. Musculoskeletal: Tumor involvement of the left iliopsoas musculature (series 15/images 1 and 5). Multiple lesions in the bilateral bony pelvis and proximal humerii, measuring up to 9 mm on the left (series 10/image 10), suggesting metastases/lymphomatous involvement. IMPRESSION: Retroperitoneal, left posterior pelvic, and left inguinal lymphadenopathy, as described above, progressive. Tumor likely involves the left iliopsoas musculature. Tumor continues to involve the exiting S1-3 nerve roots on the left, particularly S2. Tumor likely involves the left posterolateral rectum. Irregular bladder wall thickening, poorly evaluated. Indwelling Foley catheter. Suspected multifocal osseous metastases/lymphomatous involvement. Correlate with pending PET-CT. Electronically Signed: By: Julian Hy M.D. On: 07/19/2020 10:37   NM PET Image Initial (PI) Skull Base To Thigh  Result Date: 07/19/2020 CLINICAL DATA:  Subsequent treatment strategy for diffuse large B-cell lymphoma. EXAM: NUCLEAR MEDICINE PET SKULL BASE TO THIGH TECHNIQUE: 8.8 mCi F-18 FDG was injected intravenously. Full-ring PET imaging was performed from the skull base to thigh after the radiotracer. CT data was obtained and used for attenuation correction and anatomic localization. Fasting blood glucose: 113  mg/dl COMPARISON:  MR pelvis dated 07/20/2019.  PET-CT dated 04/26/2020. FINDINGS: Mediastinal blood pool activity: SUV max 3.0 Liver activity: SUV max 4.6 NECK: 2.7 cm right pharyngeal lesion (series 4/image 20), max SUV 11.2, new. Numerous right cervical nodes, including a 10 mm short axis right level 2 node (series 4/image 29), max SUV 5.6, new. Incidental CT findings: none CHEST: No hypermetabolic thoracic lymphadenopathy. No suspicious pulmonary nodules. Left chest port terminates in the lower SVC. Incidental CT findings: Biapical pleural-parenchymal scarring. Mild centrilobular and paraseptal emphysematous changes, upper lung predominant. Atherosclerotic calcifications of the aortic arch. Three vessel coronary atherosclerosis. ABDOMEN/PELVIS: 1.9 cm metastasis inferiorly in the right hepatic lobe (series 4/image 124), max SUV 11.0, new. Reference splenic hypermetabolism max SUV 3.6, without focal lesion. No abnormal hypermetabolism in the pancreas or adrenal glands. Retroperitoneal/pelvic lymphadenopathy, including: --15 mm short axis node at the aortic bifurcation (series 4/image 152), max SUV 21.5, new --19 mm short axis right common iliac node (series 4/image 156), max SUV 17.0, new --14 mm short axis left external iliac node (series 4/image 172), max SUV 15.6, new --6.1 x 2.3 cm nodal mass along the left posterior pelvic sidewall (series 4/image 175), similar to prior  pelvic MR (July 2021) but progressive from prior PET, max SUV 17.5, previously 10.7 Abnormal soft tissue along the left posterior aspect of the rectum (series 4/image 26), max SUV 15.6, new. Irregular masslike wall thickening involving the bladder (series 4/image 180), raising concern for underlying neoplasm. Bladder is decompressed by indwelling Foley catheter with associated nondependent gas. This is poorly evaluated on PET due to excretory radiotracer. Incidental CT findings: Left renal cysts. Layering bladder calculi. Mild pelvic  mesenteric stranding. SKELETON: Tumor involvement of the left iliopsoas musculature, max SUV 19.5, new. Additional involvement of the medial right psoas muscle, max SUV 18.6, new. Multifocal osseous metastases involving the visualized axial and appendicular skeleton, including the bilateral proximal humerii, right acromion and scapula, multiple right ribs, multiple thoracolumbar vertebral bodies, bilateral pelvis, and bilateral proximal femurs. Representative lesions include: --Left T11 vertebral body, max SUV 10.2 --Right L3 vertebral body, max SUV 9.8 --Left iliac crest, max SUV 21.5 Incidental CT findings: Degenerative changes of the visualized thoracolumbar spine. IMPRESSION: Progressive retroperitoneal/pelvic lymphadenopathy, as described above. Associated involvement of the bilateral iliopsoas musculature as well as the left posterolateral rectum, new. New hepatic metastasis. Irregular masslike wall thickening involving the bladder, raising concern for underlying neoplasm. Consider cystoscopy if clinically warranted. Right pharyngeal lesion, new. This presumably reflects lymphoma but is amenable to visual inspection and sampling if there is concern for primary head/neck cancer. Associated cervical nodal metastases. Multifocal osseous metastases/lymphomatous involvement in the visualized axial and appendicular skeleton. Electronically Signed   By: Julian Hy M.D.   On: 07/19/2020 16:22   DG Knee Complete 4 Views Left  Result Date: 07/14/2020 CLINICAL DATA:  Left knee pain.  Recurrent falls. EXAM: LEFT KNEE - COMPLETE 4+ VIEW COMPARISON:  None. FINDINGS: Mild tricompartmental peripheral spurring. The joint spaces are preserved. Trace joint effusion. There is a 3 cm well-defined osseous projection from the lateral femoral metaphysis directed away from the joint with cortical and medullary continuity consistent with osteochondroma. No fracture, erosion or bony destruction. IMPRESSION: 1. Mild  tricompartmental osteoarthritis with trace joint effusion. No acute fracture. 2. Elongated 3 cm osteochondroma from the lateral femur. In the absence of focal pain in this region, no dedicated further imaging is needed. If there is focal pain, consider further evaluation with MRI. Electronically Signed   By: Keith Rake M.D.   On: 07/14/2020 22:27   DG HIP UNILAT WITH PELVIS 2-3 VIEWS LEFT  Result Date: 07/14/2020 CLINICAL DATA:  Left hip pain.  Recurrent falls. EXAM: DG HIP (WITH OR WITHOUT PELVIS) 2-3V LEFT COMPARISON:  None. FINDINGS: Mild osteoarthritis of the left hip with minimal joint space narrowing and femoral head spurring. Mild right hip osteoarthritis also seen. Pubic rami are intact. The sacroiliac joints are congruent. There is no evidence of fracture, avascular necrosis, focal bone lesion or bony destruction. Soft tissues are unremarkable. IMPRESSION: Mild osteoarthritis of the left hip. Electronically Signed   By: Keith Rake M.D.   On: 07/14/2020 22:24      Subjective: Patient seen and examined at the bedside this morning at the cancer center.  Hemodynamically stable.  Heart rate in the range of 100 -120 and was on NSR.  Currently on metoprolol.  Stable for discharge  Discharge Exam: Vitals:   08/03/20 0524 08/03/20 0928  BP: 135/85   Pulse: (!) 133 (!) 113  Resp: 20   Temp: 99.6 F (37.6 C)   SpO2: 98%    Vitals:   08/02/20 1800 08/02/20 2103 08/03/20 0524 08/03/20 7322  BP: 140/76 135/73 135/85   Pulse: 98 (!) 107 (!) 133 (!) 113  Resp: 18 20 20    Temp: 99.3 F (37.4 C) 99.6 F (37.6 C) 99.6 F (37.6 C)   TempSrc: Oral Oral    SpO2: 98% 96% 98%   Weight:      Height:        General: Pt is alert, awake, not in acute distress Cardiovascular: Sinus tachy,, no rubs, no gallops Respiratory: CTA bilaterally, no wheezing, no rhonchi Abdominal: Soft, NT, ND, bowel sounds + Extremities: no edema, no cyanosis    The results of significant diagnostics  from this hospitalization (including imaging, microbiology, ancillary and laboratory) are listed below for reference.     Microbiology: Recent Results (from the past 240 hour(s))  SARS Coronavirus 2 by RT PCR (hospital order, performed in [place] hospital lab)     Status: None   Collection Time: 07/27/20  3:24 PM  Result Value Ref Range Status   SARS Coronavirus 2 NEGATIVE NEGATIVE Final    Comment: (NOTE) SARS-CoV-2 target nucleic acids are NOT DETECTED.  The SARS-CoV-2 RNA is generally detectable in upper and lower respiratory specimens during the acute phase of infection. The lowest concentration of SARS-CoV-2 viral copies this assay can detect is 250 copies / mL. A negative result does not preclude SARS-CoV-2 infection and should not be used as the sole basis for treatment or other patient management decisions.  A negative result may occur with improper specimen collection / handling, submission of specimen other than nasopharyngeal swab, presence of viral mutation(s) within the areas targeted by this assay, and inadequate number of viral copies (<250 copies / mL). A negative result must be combined with clinical observations, patient history, and epidemiological information.  Fact Sheet for Patients:   StrictlyIdeas.no  Fact Sheet for Healthcare Providers: BankingDealers.co.za  This test is not yet approved or  cleared by the [place] FDA and has been authorized for detection and/or diagnosis of SARS-CoV-2 by FDA under an Emergency Use Authorization (EUA).  This EUA will remain in effect (meaning this test can be used) for the duration of the COVID-19 declaration under Section 564(b)(1) of the Act, 21 U.S.C. section 360bbb-3(b)(1), unless the authorization is terminated or revoked sooner.  Performed at [facility], [address], [place], [place] [address]   MRSA PCR Screening     Status: None   Collection Time:  07/27/20  3:49 PM   Specimen: Nasopharyngeal Swab  Result Value Ref Range Status   MRSA by PCR NEGATIVE NEGATIVE Final    Comment:        The GeneXpert MRSA Assay (FDA approved for NASAL specimens only), is one component of a comprehensive MRSA colonization surveillance program. It is not intended to diagnose MRSA infection nor to guide or monitor treatment for MRSA infections. Performed at [facility], [address] [place], [place] [address]   Culture, Urine     Status: Abnormal   Collection Time: 07/28/20 12:02 PM   Specimen: Urine, Random  Result Value Ref Range Status   Specimen Description   Final    URINE, RANDOM Performed at [facility], [address], [place], [place] [address]    Special Requests   Final    NONE Performed at [facility], [address] [place], [place] [address]    Culture MULTIPLE SPECIES PRESENT, SUGGEST RECOLLECTION (A)  Final   Report Status 07/29/2020 FINAL  Final  Respiratory Panel by RT PCR (Flu A&B, Covid) -  Nasopharyngeal Swab     Status: None   Collection Time: 07/29/20 12:44 PM   Specimen: Nasopharyngeal Swab  Result Value Ref Range Status   SARS Coronavirus 2 by RT PCR NEGATIVE NEGATIVE Final    Comment: (NOTE) SARS-CoV-2 target nucleic acids are NOT DETECTED.  The SARS-CoV-2 RNA is generally detectable in upper respiratoy specimens during the acute phase of infection. The lowest concentration of SARS-CoV-2 viral copies this assay can detect is 131 copies/mL. A negative result does not preclude SARS-Cov-2 infection and should not be used as the sole basis for treatment or other patient management decisions. A negative result may occur with  improper specimen collection/handling, submission of specimen other than nasopharyngeal swab, presence of viral mutation(s) within the areas targeted by this assay, and inadequate number of viral copies (<131 copies/mL). A negative result must be combined with clinical observations, patient  history, and epidemiological information. The expected result is Negative.  Fact Sheet for Patients:  PinkCheek.be  Fact Sheet for Healthcare Providers:  GravelBags.it  This test is no t yet approved or cleared by the [place] FDA and  has been authorized for detection and/or diagnosis of SARS-CoV-2 by FDA under an Emergency Use Authorization (EUA). This EUA will remain  in effect (meaning this test can be used) for the duration of the COVID-19 declaration under Section 564(b)(1) of the Act, 21 U.S.C. section 360bbb-3(b)(1), unless the authorization is terminated or revoked sooner.     Influenza A by PCR NEGATIVE NEGATIVE Final   Influenza B by PCR NEGATIVE NEGATIVE Final    Comment: (NOTE) The Xpert Xpress SARS-CoV-2/FLU/RSV assay is intended as an aid in  the diagnosis of influenza from Nasopharyngeal swab specimens and  should not be used as a sole basis for treatment. Nasal washings and  aspirates are unacceptable for Xpert Xpress SARS-CoV-2/FLU/RSV  testing.  Fact Sheet for Patients: PinkCheek.be  Fact Sheet for Healthcare Providers: GravelBags.it  This test is not yet approved or cleared by the [place] FDA and  has been authorized for detection and/or diagnosis of SARS-CoV-2 by  FDA under an Emergency Use Authorization (EUA). This EUA will remain  in effect (meaning this test can be used) for the duration of the  Covid-19 declaration under Section 564(b)(1) of the Act, 21  U.S.C. section 360bbb-3(b)(1), unless the authorization is  terminated or revoked. Performed at [facility], [address], [place], [place] [address]   Resp Panel by RT PCR (RSV, Flu A&B, Covid) - Nasopharyngeal Swab     Status: None   Collection Time: 07/30/20  2:41 PM   Specimen: Nasopharyngeal Swab  Result Value Ref Range Status   SARS Coronavirus 2 by RT PCR NEGATIVE  NEGATIVE Final    Comment: (NOTE) SARS-CoV-2 target nucleic acids are NOT DETECTED.  The SARS-CoV-2 RNA is generally detectable in upper respiratoy specimens during the acute phase of infection. The lowest concentration of SARS-CoV-2 viral copies this assay can detect is 131 copies/mL. A negative result does not preclude SARS-Cov-2 infection and should not be used as the sole basis for treatment or other patient management decisions. A negative result may occur with  improper specimen collection/handling, submission of specimen other than nasopharyngeal swab, presence of viral mutation(s) within the areas targeted by this assay, and inadequate number of viral copies (<131 copies/mL). A negative result must be combined with clinical observations, patient history, and epidemiological information. The expected result is Negative.  Fact Sheet for Patients:  PinkCheek.be  Fact Sheet for Healthcare  Providers:  GravelBags.it  This test is no t yet approved or cleared by the [place] and  has been authorized for detection and/or diagnosis of SARS-CoV-2 by FDA under an Emergency Use Authorization (EUA). This EUA will remain  in effect (meaning this test can be used) for the duration of the COVID-19 declaration under Section 564(b)(1) of the Act, 21 U.S.C. section 360bbb-3(b)(1), unless the authorization is terminated or revoked sooner.     Influenza A by PCR NEGATIVE NEGATIVE Final   Influenza B by PCR NEGATIVE NEGATIVE Final    Comment: (NOTE) The Xpert Xpress SARS-CoV-2/FLU/RSV assay is intended as an aid in  the diagnosis of influenza from Nasopharyngeal swab specimens and  should not be used as a sole basis for treatment. Nasal washings and  aspirates are unacceptable for Xpert Xpress SARS-CoV-2/FLU/RSV  testing.  Fact Sheet for Patients: PinkCheek.be  Fact Sheet for Healthcare  Providers: GravelBags.it  This test is not yet approved or cleared by the [place] FDA and  has been authorized for detection and/or diagnosis of SARS-CoV-2 by  FDA under an Emergency Use Authorization (EUA). This EUA will remain  in effect (meaning this test can be used) for the duration of the  Covid-19 declaration under Section 564(b)(1) of the Act, 21  U.S.C. section 360bbb-3(b)(1), unless the authorization is  terminated or revoked.    Respiratory Syncytial Virus by PCR NEGATIVE NEGATIVE Final    Comment: (NOTE) Fact Sheet for Patients: PinkCheek.be  Fact Sheet for Healthcare Providers: GravelBags.it  This test is not yet approved or cleared by the [place] FDA and  has been authorized for detection and/or diagnosis of SARS-CoV-2 by  FDA under an Emergency Use Authorization (EUA). This EUA will remain  in effect (meaning this test can be used) for the duration of the  COVID-19 declaration under Section 564(b)(1) of the Act, 21 U.S.C.  section 360bbb-3(b)(1), unless the authorization is terminated or  revoked. Performed at [facility], [address], [place], [place] [address]   Urine culture     Status: Abnormal   Collection Time: 07/30/20  4:31 PM   Specimen: Urine, Clean Catch  Result Value Ref Range Status   Specimen Description   Final    URINE, CLEAN CATCH Performed at [facility], [address] [place], [place] [address]    Special Requests   Final    NONE Performed at [facility], [address], [place], [place] [address]    Culture MULTIPLE SPECIES PRESENT, SUGGEST RECOLLECTION (A)  Final   Report Status 08/01/2020 FINAL  Final  Respiratory Panel by RT PCR (Flu A&B, Covid) - Nasopharyngeal Swab     Status: None   Collection Time: 08/03/20  9:30 AM   Specimen: Nasopharyngeal Swab  Result Value Ref Range Status   SARS Coronavirus 2 by RT PCR NEGATIVE NEGATIVE  Final    Comment: (NOTE) SARS-CoV-2 target nucleic acids are NOT DETECTED.  The SARS-CoV-2 RNA is generally detectable in upper respiratoy specimens during the acute phase of infection. The lowest concentration of SARS-CoV-2 viral copies this assay can detect is 131 copies/mL. A negative result does not preclude SARS-Cov-2 infection and should not be used as the sole basis for treatment or other patient management decisions. A negative result may occur with  improper specimen collection/handling, submission of specimen other than nasopharyngeal swab, presence of viral mutation(s) within the areas targeted by this assay, and inadequate number of viral copies (<131 copies/mL). A negative result must be  combined with clinical observations, patient history, and epidemiological information. The expected result is Negative.  Fact Sheet for Patients:  PinkCheek.be  Fact Sheet for Healthcare Providers:  GravelBags.it  This test is no t yet approved or cleared by the [place] FDA and  has been authorized for detection and/or diagnosis of SARS-CoV-2 by FDA under an Emergency Use Authorization (EUA). This EUA will remain  in effect (meaning this test can be used) for the duration of the COVID-19 declaration under Section 564(b)(1) of the Act, 21 U.S.C. section 360bbb-3(b)(1), unless the authorization is terminated or revoked sooner.     Influenza A by PCR NEGATIVE NEGATIVE Final   Influenza B by PCR NEGATIVE NEGATIVE Final    Comment: (NOTE) The Xpert Xpress SARS-CoV-2/FLU/RSV assay is intended as an aid in  the diagnosis of influenza from Nasopharyngeal swab specimens and  should not be used as a sole basis for treatment. Nasal washings and  aspirates are unacceptable for Xpert Xpress SARS-CoV-2/FLU/RSV  testing.  Fact Sheet for Patients: PinkCheek.be  Fact Sheet for Healthcare  Providers: GravelBags.it  This test is not yet approved or cleared by the [place] FDA and  has been authorized for detection and/or diagnosis of SARS-CoV-2 by  FDA under an Emergency Use Authorization (EUA). This EUA will remain  in effect (meaning this test can be used) for the duration of the  Covid-19 declaration under Section 564(b)(1) of the Act, 21  U.S.C. section 360bbb-3(b)(1), unless the authorization is  terminated or revoked. Performed at [facility], [address] [place], [place] [address]      Labs: BNP (last 3 results) No results for input(s): BNP in the last 8760 hours. Basic Metabolic Panel: Recent Labs  Lab 07/31/20 0645 08/01/20 0708 08/02/20 0601 08/03/20 0252 08/03/20 1003  NA 136 135 136 136 137  K 3.5 3.3* 3.5 4.0 4.0  CL 100 105 105 109 109  CO2 21* 19* 20* 18* 18*  GLUCOSE 99 108* 127* 96 104*  BUN 25* 24* 20 18 18   CREATININE 1.76* 1.64* 1.52* 1.37* 1.48*  CALCIUM 9.6 9.1 8.8* 8.8* 9.2  MG  --   --   --   --  2.0   Liver Function Tests: Recent Labs  Lab 07/28/20 0339 08/03/20 0851 08/03/20 1003  AST 12* 52* 58*  ALT 13 37 39  ALKPHOS 93 150* 154*  BILITOT 0.3 0.6 0.9  PROT 6.6 6.6 6.8  ALBUMIN 2.9* 2.5* 2.6*   No results for input(s): LIPASE, AMYLASE in the last 168 hours. No results for input(s): AMMONIA in the last 168 hours. CBC: Recent Labs  Lab 07/28/20 0339 07/30/20 1310 08/03/20 0851 08/03/20 1003  WBC 4.5 7.0 5.0 5.6  NEUTROABS  --  5.2 3.5 4.0  HGB 8.2* 8.9* 8.3* 8.6*  HCT 26.9* 28.9* 28.0* 27.7*  MCV 103.1* 104.3* 104.5* 103.4*  PLT 342 276 282 317   Cardiac Enzymes: No results for input(s): CKTOTAL, CKMB, CKMBINDEX, TROPONINI in the last 168 hours. BNP: Invalid input(s): POCBNP CBG: Recent Labs  Lab 07/28/20 1129 07/28/20 1603 07/28/20 2143 07/29/20 0736 07/29/20 1117  GLUCAP 126* 129* 93 110* 122*   D-Dimer No results for input(s): DDIMER in the last 72 hours. Hgb  A1c No results for input(s): HGBA1C in the last 72 hours. Lipid Profile No results for input(s): CHOL, HDL, LDLCALC, TRIG, CHOLHDL, LDLDIRECT in the last 72 hours. Thyroid function studies No results for input(s): TSH, T4TOTAL, T3FREE, THYROIDAB in the last 72 hours.  Invalid input(s): FREET3 Anemia work up No results for input(s): VITAMINB12, FOLATE, FERRITIN, TIBC, IRON, RETICCTPCT in the last 72 hours. Urinalysis    Component Value Date/Time   COLORURINE AMBER (A) 07/30/2020 1359   APPEARANCEUR CLOUDY (A) 07/30/2020 1359   LABSPEC 1.016 07/30/2020 1359   PHURINE 8.0 07/30/2020 1359   GLUCOSEU 150 (A) 07/30/2020 1359   HGBUR NEGATIVE 07/30/2020 1359   BILIRUBINUR NEGATIVE 07/30/2020 1359   KETONESUR 20 (A) 07/30/2020 1359   PROTEINUR 100 (A) 07/30/2020 1359   UROBILINOGEN 1.0 11/18/2014 1238   NITRITE NEGATIVE 07/30/2020 1359   LEUKOCYTESUR LARGE (A) 07/30/2020 1359   Sepsis Labs Invalid input(s): PROCALCITONIN,  WBC,  LACTICIDVEN Microbiology Recent Results (from the past 240 hour(s))  SARS Coronavirus 2 by RT PCR (hospital order, performed in [facility] hospital lab)     Status: None   Collection Time: 07/27/20  3:24 PM  Result Value Ref Range Status   SARS Coronavirus 2 NEGATIVE NEGATIVE Final    Comment: (NOTE) SARS-CoV-2 target nucleic acids are NOT DETECTED.  The SARS-CoV-2 RNA is generally detectable in upper and lower respiratory specimens during the acute phase of infection. The lowest concentration of SARS-CoV-2 viral copies this assay can detect is 250 copies / mL. A negative result does not preclude SARS-CoV-2 infection and should not be used as the sole basis for treatment or other patient management decisions.  A negative result may occur with improper specimen collection / handling, submission of specimen other than nasopharyngeal swab, presence of viral mutation(s) within the areas targeted by this assay, and inadequate number of viral copies (<250  copies / mL). A negative result must be combined with clinical observations, patient history, and epidemiological information.  Fact Sheet for Patients:   StrictlyIdeas.no  Fact Sheet for Healthcare Providers: BankingDealers.co.za  This test is not yet approved or  cleared by the [place] FDA and has been authorized for detection and/or diagnosis of SARS-CoV-2 by FDA under an Emergency Use Authorization (EUA).  This EUA will remain in effect (meaning this test can be used) for the duration of the COVID-19 declaration under Section 564(b)(1) of the Act, 21 U.S.C. section 360bbb-3(b)(1), unless the authorization is terminated or revoked sooner.  Performed at [facility], [address], [place], [place] [address]   MRSA PCR Screening     Status: None   Collection Time: 07/27/20  3:49 PM   Specimen: Nasopharyngeal Swab  Result Value Ref Range Status   MRSA by PCR NEGATIVE NEGATIVE Final    Comment:        The GeneXpert MRSA Assay (FDA approved for NASAL specimens only), is one component of a comprehensive MRSA colonization surveillance program. It is not intended to diagnose MRSA infection nor to guide or monitor treatment for MRSA infections. Performed at [facility], [address], [place], [place] [address]   Culture, Urine     Status: Abnormal   Collection Time: 07/28/20 12:02 PM   Specimen: Urine, Random  Result Value Ref Range Status   Specimen Description   Final    URINE, RANDOM Performed at [facility], [address], [place], [place] [address]    Special Requests   Final    NONE Performed at [facility], [address] [place],  [address]    Culture MULTIPLE SPECIES PRESENT, SUGGEST RECOLLECTION (A)  Final   Report Status 07/29/2020 FINAL  Final  Respiratory Panel by RT PCR (Flu A&B, Covid) - Nasopharyngeal Swab     Status:  None   Collection Time: 07/29/20 12:44 PM   Specimen: Nasopharyngeal Swab   Result Value Ref Range Status   SARS Coronavirus 2 by RT PCR NEGATIVE NEGATIVE Final    Comment: (NOTE) SARS-CoV-2 target nucleic acids are NOT DETECTED.  The SARS-CoV-2 RNA is generally detectable in upper respiratoy specimens during the acute phase of infection. The lowest concentration of SARS-CoV-2 viral copies this assay can detect is 131 copies/mL. A negative result does not preclude SARS-Cov-2 infection and should not be used as the sole basis for treatment or other patient management decisions. A negative result may occur with  improper specimen collection/handling, submission of specimen other than nasopharyngeal swab, presence of viral mutation(s) within the areas targeted by this assay, and inadequate number of viral copies (<131 copies/mL). A negative result must be combined with clinical observations, patient history, and epidemiological information. The expected result is Negative.  Fact Sheet for Patients:  PinkCheek.be  Fact Sheet for Healthcare Providers:  GravelBags.it  This test is no t yet approved or cleared by the [place] FDA and  has been authorized for detection and/or diagnosis of SARS-CoV-2 by FDA under an Emergency Use Authorization (EUA). This EUA will remain  in effect (meaning this test can be used) for the duration of the COVID-19 declaration under Section 564(b)(1) of the Act, 21 U.S.C. section 360bbb-3(b)(1), unless the authorization is terminated or revoked sooner.     Influenza A by PCR NEGATIVE NEGATIVE Final   Influenza B by PCR NEGATIVE NEGATIVE Final    Comment: (NOTE) The Xpert Xpress SARS-CoV-2/FLU/RSV assay is intended as an aid in  the diagnosis of influenza from Nasopharyngeal swab specimens and  should not be used as a sole basis for treatment. Nasal washings and  aspirates are unacceptable for Xpert Xpress SARS-CoV-2/FLU/RSV  testing.  Fact Sheet for  Patients: PinkCheek.be  Fact Sheet for Healthcare Providers: GravelBags.it  This test is not yet approved or cleared by the [place] FDA and  has been authorized for detection and/or diagnosis of SARS-CoV-2 by  FDA under an Emergency Use Authorization (EUA). This EUA will remain  in effect (meaning this test can be used) for the duration of the  Covid-19 declaration under Section 564(b)(1) of the Act, 21  U.S.C. section 360bbb-3(b)(1), unless the authorization is  terminated or revoked. Performed at [facility], [address], [place], [place] [address]   Resp Panel by RT PCR (RSV, Flu A&B, Covid) - Nasopharyngeal Swab     Status: None   Collection Time: 07/30/20  2:41 PM   Specimen: Nasopharyngeal Swab  Result Value Ref Range Status   SARS Coronavirus 2 by RT PCR NEGATIVE NEGATIVE Final    Comment: (NOTE) SARS-CoV-2 target nucleic acids are NOT DETECTED.  The SARS-CoV-2 RNA is generally detectable in upper respiratoy specimens during the acute phase of infection. The lowest concentration of SARS-CoV-2 viral copies this assay can detect is 131 copies/mL. A negative result does not preclude SARS-Cov-2 infection and should not be used as the sole basis for treatment or other patient management decisions. A negative result may occur with  improper specimen collection/handling, submission of specimen other than nasopharyngeal swab, presence of viral mutation(s) within the areas targeted by this assay, and inadequate number of viral copies (<131 copies/mL). A negative result must be combined with clinical observations, patient history, and epidemiological information. The expected result is Negative.  Fact Sheet for Patients:  PinkCheek.be  Fact Sheet for Healthcare Providers:  GravelBags.it  This test is  no t yet approved or cleared by the [place] and   has been authorized for detection and/or diagnosis of SARS-CoV-2 by FDA under an Emergency Use Authorization (EUA). This EUA will remain  in effect (meaning this test can be used) for the duration of the COVID-19 declaration under Section 564(b)(1) of the Act, 21 U.S.C. section 360bbb-3(b)(1), unless the authorization is terminated or revoked sooner.     Influenza A by PCR NEGATIVE NEGATIVE Final   Influenza B by PCR NEGATIVE NEGATIVE Final    Comment: (NOTE) The Xpert Xpress SARS-CoV-2/FLU/RSV assay is intended as an aid in  the diagnosis of influenza from Nasopharyngeal swab specimens and  should not be used as a sole basis for treatment. Nasal washings and  aspirates are unacceptable for Xpert Xpress SARS-CoV-2/FLU/RSV  testing.  Fact Sheet for Patients: PinkCheek.be  Fact Sheet for Healthcare Providers: GravelBags.it  This test is not yet approved or cleared by the [place] FDA and  has been authorized for detection and/or diagnosis of SARS-CoV-2 by  FDA under an Emergency Use Authorization (EUA). This EUA will remain  in effect (meaning this test can be used) for the duration of the  Covid-19 declaration under Section 564(b)(1) of the Act, 21  U.S.C. section 360bbb-3(b)(1), unless the authorization is  terminated or revoked.    Respiratory Syncytial Virus by PCR NEGATIVE NEGATIVE Final    Comment: (NOTE) Fact Sheet for Patients: PinkCheek.be  Fact Sheet for Healthcare Providers: GravelBags.it  This test is not yet approved or cleared by the [place] FDA and  has been authorized for detection and/or diagnosis of SARS-CoV-2 by  FDA under an Emergency Use Authorization (EUA). This EUA will remain  in effect (meaning this test can be used) for the duration of the  COVID-19 declaration under Section 564(b)(1) of the Act, 21 U.S.C.  section  360bbb-3(b)(1), unless the authorization is terminated or  revoked. Performed at [facility], [address] [place], [place] [address]   Urine culture     Status: Abnormal   Collection Time: 07/30/20  4:31 PM   Specimen: Urine, Clean Catch  Result Value Ref Range Status   Specimen Description   Final    URINE, CLEAN CATCH Performed at [facility], [address], [place], [place] [address]    Special Requests   Final    NONE Performed at [facility], [address] [place], [place] [address]    Culture MULTIPLE SPECIES PRESENT, SUGGEST RECOLLECTION (A)  Final   Report Status 08/01/2020 FINAL  Final  Respiratory Panel by RT PCR (Flu A&B, Covid) - Nasopharyngeal Swab     Status: None   Collection Time: 08/03/20  9:30 AM   Specimen: Nasopharyngeal Swab  Result Value Ref Range Status   SARS Coronavirus 2 by RT PCR NEGATIVE NEGATIVE Final    Comment: (NOTE) SARS-CoV-2 target nucleic acids are NOT DETECTED.  The SARS-CoV-2 RNA is generally detectable in upper respiratoy specimens during the acute phase of infection. The lowest concentration of SARS-CoV-2 viral copies this assay can detect is 131 copies/mL. A negative result does not preclude SARS-Cov-2 infection and should not be used as the sole basis for treatment or other patient management decisions. A negative result may occur with  improper specimen collection/handling, submission of specimen other than nasopharyngeal swab, presence of viral mutation(s) within the areas targeted by this assay, and inadequate number of viral copies (<131 copies/mL). A negative result must be combined with clinical observations, patient history, and  epidemiological information. The expected result is Negative.  Fact Sheet for Patients:  PinkCheek.be  Fact Sheet for Healthcare Providers:  GravelBags.it  This test is no t yet approved or cleared by the [place] FDA and  has  been authorized for detection and/or diagnosis of SARS-CoV-2 by FDA under an Emergency Use Authorization (EUA). This EUA will remain  in effect (meaning this test can be used) for the duration of the COVID-19 declaration under Section 564(b)(1) of the Act, 21 U.S.C. section 360bbb-3(b)(1), unless the authorization is terminated or revoked sooner.     Influenza A by PCR NEGATIVE NEGATIVE Final   Influenza B by PCR NEGATIVE NEGATIVE Final    Comment: (NOTE) The Xpert Xpress SARS-CoV-2/FLU/RSV assay is intended as an aid in  the diagnosis of influenza from Nasopharyngeal swab specimens and  should not be used as a sole basis for treatment. Nasal washings and  aspirates are unacceptable for Xpert Xpress SARS-CoV-2/FLU/RSV  testing.  Fact Sheet for Patients: PinkCheek.be  Fact Sheet for Healthcare Providers: GravelBags.it  This test is not yet approved or cleared by the [place] FDA and  has been authorized for detection and/or diagnosis of SARS-CoV-2 by  FDA under an Emergency Use Authorization (EUA). This EUA will remain  in effect (meaning this test can be used) for the duration of the  Covid-19 declaration under Section 564(b)(1) of the Act, 21  U.S.C. section 360bbb-3(b)(1), unless the authorization is  terminated or revoked. Performed at [facility], [address] [place], [place] [address]     Please note: You were cared for by a hospitalist during your hospital stay. Once you are discharged, your primary care physician will handle any further medical issues. Please note that NO REFILLS for any discharge medications will be authorized once you are discharged, as it is imperative that you return to your primary care physician (or establish a relationship with a primary care physician if you do not have one) for your post hospital discharge needs so that they can reassess your need for medications and monitor your lab  values.    Time coordinating discharge: 40 minutes  SIGNED:   Shelly Coss, MD  [facility] 08/03/2020, 3:44 PM Pager [phone]  If 7PM-7AM, please contact night-coverage [email or web address] Password [email or web address]

## 2020-08-03 NOTE — Care Management Important Message (Signed)
Important Message  Patient Details  Name: Daniel Richards MRN: [id number] Date of Birth: [birth year]   Medicare Important Message Given:  Yes     [name] 08/03/2020, 1:59 PM

## 2020-08-06 NOTE — Progress Notes (Signed)
  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   Plan: The patient will follow-up with radiation oncology in 1 mo. -----------------------------------  Eppie Gibson, MD

## 2020-08-09 ENCOUNTER — Other Ambulatory Visit: Payer: Self-pay

## 2020-08-09 ENCOUNTER — Encounter (HOSPITAL_COMMUNITY): Payer: Self-pay | Admitting: Nurse Practitioner

## 2020-08-09 ENCOUNTER — Ambulatory Visit (HOSPITAL_COMMUNITY)
Admission: RE | Admit: 2020-08-09 | Discharge: 2020-08-09 | Disposition: A | Discharge status: Home / Self Care | Payer: Medicare Other | Source: Ambulatory Visit | Attending: Nurse Practitioner | Admitting: Nurse Practitioner

## 2020-08-09 VITALS — BP 112/70 | HR 131 | Ht 72.0 in | Wt 178.2 lb

## 2020-08-09 DIAGNOSIS — E1140 Type 2 diabetes mellitus with diabetic neuropathy, unspecified: Secondary | ICD-10-CM | POA: Diagnosis not present

## 2020-08-09 DIAGNOSIS — D6869 Other thrombophilia: Secondary | ICD-10-CM

## 2020-08-09 DIAGNOSIS — I4819 Other persistent atrial fibrillation: Secondary | ICD-10-CM | POA: Insufficient documentation

## 2020-08-09 DIAGNOSIS — Z8249 Family history of ischemic heart disease and other diseases of the circulatory system: Secondary | ICD-10-CM | POA: Insufficient documentation

## 2020-08-09 DIAGNOSIS — I10 Essential (primary) hypertension: Secondary | ICD-10-CM | POA: Insufficient documentation

## 2020-08-09 DIAGNOSIS — Z79899 Other long term (current) drug therapy: Secondary | ICD-10-CM | POA: Insufficient documentation

## 2020-08-09 DIAGNOSIS — Z8572 Personal history of non-Hodgkin lymphomas: Secondary | ICD-10-CM | POA: Diagnosis not present

## 2020-08-09 DIAGNOSIS — Z87891 Personal history of nicotine dependence: Secondary | ICD-10-CM | POA: Insufficient documentation

## 2020-08-09 DIAGNOSIS — Z86711 Personal history of pulmonary embolism: Secondary | ICD-10-CM | POA: Diagnosis not present

## 2020-08-09 DIAGNOSIS — Z7984 Long term (current) use of oral hypoglycemic drugs: Secondary | ICD-10-CM | POA: Insufficient documentation

## 2020-08-09 DIAGNOSIS — Z7901 Long term (current) use of anticoagulants: Secondary | ICD-10-CM | POA: Diagnosis not present

## 2020-08-09 DIAGNOSIS — I4891 Unspecified atrial fibrillation: Secondary | ICD-10-CM

## 2020-08-09 NOTE — Progress Notes (Addendum)
Primary Care Physician: [name], FNP Referring Physician:[facility]  f/u    Daniel Richards is a 74 y.o. male with a h/o  B-cell lymphoma, hypercalcemia, diabetes with neuropathy, depression, history of PE, hypertension, GERD who was recently managed at [facility] hospital 9/24 to 9/28,  for mild hypercalcemia, multiple falls to residential hospice of [facility]. He was brought back to the emergency department with complaints of generalized weakness.   PT/OT recommended skilled services on discharge.  He was discharged in afib on toprol hopefully to obtain rate control. He was to f/u with afib clinic to evaluate this. He was already on eliquis for a CHA2DS2VASc of at least 6 for prior PE's.   He is now in the afib clinic, brought in by [facility] from his SNF in that area.  Ekg  shows afib vrs flutter at 130 bpm. He is on a stretcher and appears to be chronically debilitated. The  daughter is with him. He is under going  CA treatments. He has infusions st the CA center every Tuesday.  Today, he denies symptoms of palpitations, chest pain, + shortness of breath,  No orthopnea, PND, lower extremity edema, dizziness, presyncope, syncope, or neurologic sequela.+ weakness.  The patient is tolerating medications without difficulties and is otherwise without complaint today.   Past Medical History:  Diagnosis Date  . Acid reflux   . Anxiety and depression   . Cancer ([place])    lymphoma  . Depression   . Diabetes ([place])   . Diabetes mellitus 06/09/2011   pt taking metformin  . DJD of shoulder    right   . Hyperlipidemia   . Hypertension   . Lymphoma ([place])   . Migraines   . Port-A-Cath in place 08/13/2019  . Pulmonary embolism ([place])   . Stomach ulcer   . Tremor   . Tremor   . Vitamin D deficiency    Past Surgical History:  Procedure Laterality Date  . BACK SURGERY    . KNEE SURGERY    . PORTACATH PLACEMENT Left 08/08/2019   Procedure: INSERTION PORT-A-CATH;  Surgeon:  Aviva Signs, MD;  Location: [facility];  Service: General;  Laterality: Left;  . SHOULDER SURGERY      Current Outpatient Medications  Medication Sig Dispense Refill  . allopurinol (ZYLOPRIM) 300 MG tablet Take 300 mg by mouth daily.    Marland Kitchen apixaban (ELIQUIS) 5 MG TABS tablet Take 5 mg by mouth 2 (two) times daily.    . cyclobenzaprine (FLEXERIL) 10 MG tablet Take 1 tablet (10 mg total) by mouth at bedtime. 30 tablet 0  . dapagliflozin propanediol (FARXIGA) 10 MG TABS tablet Take 10 mg by mouth daily.    Marland Kitchen gabapentin (NEURONTIN) 300 MG capsule Take 600 mg by mouth 3 (three) times daily.    . metFORMIN (GLUCOPHAGE) 500 MG tablet Take 500 mg by mouth 2 (two) times daily with a meal.    . metoprolol succinate (TOPROL-XL) 50 MG 24 hr tablet Take 75 mg by mouth daily. Take with or immediately following a meal.     . mirtazapine (REMERON) 30 MG tablet Take 1 tablet (30 mg total) by mouth at bedtime.    . pantoprazole (PROTONIX) 40 MG tablet Take 40 mg by mouth daily.    . potassium chloride (KLOR-CON) 20 MEQ packet Take 20 mEq by mouth daily.    . primidone (MYSOLINE) 50 MG tablet Take 1.5 tablets (75 mg total) by mouth every 8 (eight) hours. (  Patient taking differently: Take 100 mg by mouth 2 (two) times daily. ) 150 tablet 0  . REVLIMID 10 MG capsule Take by mouth.    . temazepam (RESTORIL) 15 MG capsule Take 1 capsule (15 mg total) by mouth at bedtime. 15 capsule 0  . topiramate (TOPAMAX) 200 MG tablet Take 200 mg by mouth at bedtime.     No current facility-administered medications for this encounter.    Allergies  Allergen Reactions  . Vancomycin Hives    Patient received Benadryl to treat the hives    Social History   Socioeconomic History  . Marital status: Divorced    Spouse name: Not on file  . Number of children: 2  . Years of education: 12+  . Highest education level: Not on file  Occupational History  . Not on file  Tobacco Use  . Smoking status: Former Smoker     Packs/day: 2.00    Years: 20.00    Pack years: 40.00    Types: Cigarettes    Quit date: 11/17/1989    Years since quitting: 30.7  . Smokeless tobacco: Never Used  Vaping Use  . Vaping Use: Never used  Substance and Sexual Activity  . Alcohol use: No  . Drug use: No  . Sexual activity: Not Currently  Other Topics Concern  . Not on file  Social History Narrative   Lives at home with son.   Caffeine use: Drinks 1 cup coffee (3 cups per week-decaf)   Social Determinants of Health   Financial Resource Strain:   . Difficulty of Paying Living Expenses: Not on file  Food Insecurity:   . Worried About Charity fundraiser in the Last Year: Not on file  . Ran Out of Food in the Last Year: Not on file  Transportation Needs:   . Lack of Transportation (Medical): Not on file  . Lack of Transportation (Non-Medical): Not on file  Physical Activity:   . Days of Exercise per Week: Not on file  . Minutes of Exercise per Session: Not on file  Stress:   . Feeling of Stress : Not on file  Social Connections:   . Frequency of Communication with Friends and Family: Not on file  . Frequency of Social Gatherings with Friends and Family: Not on file  . Attends Religious Services: Not on file  . Active Member of Clubs or Organizations: Not on file  . Attends Archivist Meetings: Not on file  . Marital Status: Not on file  Intimate Partner Violence:   . Fear of Current or Ex-Partner: Not on file  . Emotionally Abused: Not on file  . Physically Abused: Not on file  . Sexually Abused: Not on file    Family History  Problem Relation Age of Onset  . Cancer Sister   . Cancer Sister   . Cancer Brother   . Heart attack Father   . Cancer Brother   . Heart attack Brother   . Healthy Son   . Healthy Daughter   . Migraines Neg Hx     ROS- All systems are reviewed and negative except as per the HPI above  Physical Exam: Vitals:   08/09/20 1324  BP: 112/70  Pulse: (!) 131  Weight:  80.8 kg  Height: 6' (1.829 m)   Wt Readings from Last 3 Encounters:  08/09/20 80.8 kg  07/30/20 80 kg  07/27/20 80.2 kg    Labs: Lab Results  Component Value Date  NA 137 08/03/2020   K 4.0 08/03/2020   CL 109 08/03/2020   CO2 18 (L) 08/03/2020   GLUCOSE 104 (H) 08/03/2020   BUN 18 08/03/2020   CREATININE 1.48 (H) 08/03/2020   CALCIUM 9.2 08/03/2020   PHOS 4.1 02/11/2020   MG 2.0 08/03/2020   Lab Results  Component Value Date   INR 1.2 05/12/2020   No results found for: CHOL, HDL, LDLCALC, TRIG   GEN- The patient is well appearing, alert and oriented x 3 today.   Head- normocephalic, atraumatic Eyes-  Sclera clear, conjunctiva pink Ears- hearing intact Oropharynx- clear Neck- supple, no JVP Lymph- no cervical lymphadenopathy Lungs- Clear to ausculation bilaterally, normal work of breathing Heart- Rapid irregular rate and rhythm, no murmurs, rubs or gallops, PMI not laterally displaced GI- soft, NT, ND, + BS Extremities- no clubbing, cyanosis, or edema MS- no significant deformity or atrophy Skin- no rash or lesion Psych- euthymic mood, full affect Neuro- strength and sensation are intact  EKG-afib at 130 bpm    Assessment and Plan:  1. Persistent  afib with RVR I am sure his debilitated state is contributing to the afib with RVR By d/c instructions, he was to be on metoprolol succinate, at 75 mg daily He is on 50 mg daily by his med sheet  Will increase to 75 mg daily and written instructions to that effect were sent back to the nursing facility. I will need an EKG and BP in about a week but the pt does not need to be transported  here from [place] as the transport is very tiring for  him  if this could be done at his facility or at the CA unit next Tuesday that would be ideal The daughter will try to facilitate this with the CA unit tomorrow   2. CHA2DS2VASc score is 6 Continue  eliquis 5 mg bid   3. B- cell lymphoma  Per oncology group at [place]. [place] [place], [facility] [address] [place], [place] [id number] [phone]

## 2020-08-10 ENCOUNTER — Other Ambulatory Visit (HOSPITAL_COMMUNITY): Payer: Medicare Other

## 2020-08-10 ENCOUNTER — Ambulatory Visit (HOSPITAL_COMMUNITY): Payer: Medicare Other | Admitting: Hematology

## 2020-08-10 ENCOUNTER — Inpatient Hospital Stay (HOSPITAL_COMMUNITY): Payer: Medicare Other | Attending: Hematology

## 2020-08-10 ENCOUNTER — Ambulatory Visit (HOSPITAL_COMMUNITY): Payer: Medicare Other

## 2020-08-10 ENCOUNTER — Inpatient Hospital Stay (HOSPITAL_COMMUNITY): Payer: Medicare Other

## 2020-08-10 ENCOUNTER — Other Ambulatory Visit: Payer: Self-pay

## 2020-08-10 ENCOUNTER — Inpatient Hospital Stay (HOSPITAL_BASED_OUTPATIENT_CLINIC_OR_DEPARTMENT_OTHER): Payer: Medicare Other | Admitting: Hematology

## 2020-08-10 VITALS — BP 126/65 | HR 130 | Temp 97.3°F | Resp 20

## 2020-08-10 DIAGNOSIS — N189 Chronic kidney disease, unspecified: Secondary | ICD-10-CM | POA: Diagnosis not present

## 2020-08-10 DIAGNOSIS — Z7901 Long term (current) use of anticoagulants: Secondary | ICD-10-CM | POA: Insufficient documentation

## 2020-08-10 DIAGNOSIS — Z79899 Other long term (current) drug therapy: Secondary | ICD-10-CM | POA: Insufficient documentation

## 2020-08-10 DIAGNOSIS — E785 Hyperlipidemia, unspecified: Secondary | ICD-10-CM | POA: Diagnosis not present

## 2020-08-10 DIAGNOSIS — I2510 Atherosclerotic heart disease of native coronary artery without angina pectoris: Secondary | ICD-10-CM | POA: Diagnosis not present

## 2020-08-10 DIAGNOSIS — C8336 Diffuse large B-cell lymphoma, intrapelvic lymph nodes: Secondary | ICD-10-CM

## 2020-08-10 DIAGNOSIS — E559 Vitamin D deficiency, unspecified: Secondary | ICD-10-CM | POA: Diagnosis not present

## 2020-08-10 DIAGNOSIS — Z87891 Personal history of nicotine dependence: Secondary | ICD-10-CM | POA: Insufficient documentation

## 2020-08-10 DIAGNOSIS — K219 Gastro-esophageal reflux disease without esophagitis: Secondary | ICD-10-CM | POA: Insufficient documentation

## 2020-08-10 DIAGNOSIS — I129 Hypertensive chronic kidney disease with stage 1 through stage 4 chronic kidney disease, or unspecified chronic kidney disease: Secondary | ICD-10-CM | POA: Insufficient documentation

## 2020-08-10 DIAGNOSIS — C8338 Diffuse large B-cell lymphoma, lymph nodes of multiple sites: Secondary | ICD-10-CM | POA: Diagnosis not present

## 2020-08-10 DIAGNOSIS — F418 Other specified anxiety disorders: Secondary | ICD-10-CM | POA: Diagnosis not present

## 2020-08-10 DIAGNOSIS — E119 Type 2 diabetes mellitus without complications: Secondary | ICD-10-CM | POA: Insufficient documentation

## 2020-08-10 DIAGNOSIS — Z5111 Encounter for antineoplastic chemotherapy: Secondary | ICD-10-CM | POA: Insufficient documentation

## 2020-08-10 DIAGNOSIS — Z86711 Personal history of pulmonary embolism: Secondary | ICD-10-CM | POA: Insufficient documentation

## 2020-08-10 DIAGNOSIS — Z809 Family history of malignant neoplasm, unspecified: Secondary | ICD-10-CM | POA: Insufficient documentation

## 2020-08-10 LAB — COMPREHENSIVE METABOLIC PANEL
ALT: 52 U/L — ABNORMAL HIGH (ref 0–44)
AST: 113 U/L — ABNORMAL HIGH (ref 15–41)
Albumin: 2.8 g/dL — ABNORMAL LOW (ref 3.5–5.0)
Alkaline Phosphatase: 103 U/L (ref 38–126)
Anion gap: 12 (ref 5–15)
BUN: 48 mg/dL — ABNORMAL HIGH (ref 8–23)
CO2: 18 mmol/L — ABNORMAL LOW (ref 22–32)
Calcium: 10.2 mg/dL (ref 8.9–10.3)
Chloride: 104 mmol/L (ref 98–111)
Creatinine, Ser: 3.58 mg/dL — ABNORMAL HIGH (ref 0.61–1.24)
GFR calc non Af Amer: 16 mL/min — ABNORMAL LOW (ref >60)
Glucose, Bld: 108 mg/dL — ABNORMAL HIGH (ref 70–99)
Potassium: 4.2 mmol/L (ref 3.5–5.1)
Sodium: 134 mmol/L — ABNORMAL LOW (ref 135–145)
Total Bilirubin: 1.1 mg/dL (ref 0.3–1.2)
Total Protein: 6.5 g/dL (ref 6.5–8.1)

## 2020-08-10 LAB — CBC WITH DIFFERENTIAL/PLATELET
Abs Immature Granulocytes: 0.13 10*3/uL — ABNORMAL HIGH (ref 0.00–0.07)
Basophils Absolute: 0 10*3/uL (ref 0.0–0.1)
Basophils Relative: 0 %
Eosinophils Absolute: 0.1 10*3/uL (ref 0.0–0.5)
Eosinophils Relative: 2 %
HCT: 26.2 % — ABNORMAL LOW (ref 39.0–52.0)
Hemoglobin: 7.9 g/dL — ABNORMAL LOW (ref 13.0–17.0)
Immature Granulocytes: 2 %
Lymphocytes Relative: 12 %
Lymphs Abs: 0.7 10*3/uL (ref 0.7–4.0)
MCH: 31.2 pg (ref 26.0–34.0)
MCHC: 30.2 g/dL (ref 30.0–36.0)
MCV: 103.6 fL — ABNORMAL HIGH (ref 80.0–100.0)
Monocytes Absolute: 0.3 10*3/uL (ref 0.1–1.0)
Monocytes Relative: 5 %
Neutro Abs: 4.3 10*3/uL (ref 1.7–7.7)
Neutrophils Relative %: 79 %
Platelets: 246 10*3/uL (ref 150–400)
RBC: 2.53 MIL/uL — ABNORMAL LOW (ref 4.22–5.81)
RDW: 14.9 % (ref 11.5–15.5)
WBC: 5.6 10*3/uL (ref 4.0–10.5)
nRBC: 0 % (ref 0.0–0.2)

## 2020-08-10 LAB — MAGNESIUM: Magnesium: 2 mg/dL (ref 1.7–2.4)

## 2020-08-10 LAB — URIC ACID: Uric Acid, Serum: 4 mg/dL (ref 3.7–8.6)

## 2020-08-10 LAB — LACTATE DEHYDROGENASE: LDH: 377 U/L — ABNORMAL HIGH (ref 98–192)

## 2020-08-10 MED ORDER — SODIUM CHLORIDE 0.9 % IV SOLN: INTRAVENOUS | Status: DC

## 2020-08-10 MED ORDER — SODIUM CHLORIDE 0.9% FLUSH
10.0000 mL | Freq: Once | INTRAVENOUS | Status: AC
  Administered 2020-08-10: 10 mL via INTRAVENOUS

## 2020-08-10 MED ORDER — HEPARIN SOD (PORK) LOCK FLUSH 100 UNIT/ML IV SOLN
500.0000 [IU] | Freq: Once | INTRAVENOUS | Status: AC
  Administered 2020-08-10: 500 [IU] via INTRAVENOUS

## 2020-08-10 NOTE — Progress Notes (Signed)
Patient seen today and assessed by Dr. Delton Coombes.  HR 130, elevated kidney functions and liver functions.  We will give 1 liter normal saline over 2 hours and reassess.  Orders given to facility to hold revlimid and hold metformin, change patient to regular diet with no restrications and to supplement with glucerna.    Primary RN and pharmacy aware.

## 2020-08-10 NOTE — Patient Instructions (Signed)
[facility] at [facility] Discharge Instructions  Labs drawn from portacath today   Thank you for choosing [facility] at [facility] to provide your oncology and hematology care.  To afford each patient quality time with our provider, please arrive at least 15 minutes before your scheduled appointment time.   If you have a lab appointment with the Cancer Center please come in thru the Main Entrance and check in at the main information desk.  You need to re-schedule your appointment should you arrive 10 or more minutes late.  We strive to give you quality time with our providers, and arriving late affects you and other patients whose appointments are after yours.  Also, if you no show three or more times for appointments you may be dismissed from the clinic at the providers discretion.     Again, thank you for choosing [facility].  Our hope is that these requests will decrease the amount of time that you wait before being seen by our physicians.       _____________________________________________________________  Should you have questions after your visit to [facility], please contact our office at [phone] and follow the prompts.  Our office hours are 8:00 a.m. and 4:30 p.m. Monday - Friday.  Please note that voicemails left after 4:00 p.m. may not be returned until the following business day.  We are closed weekends and major holidays.  You do have access to a nurse 24-7, just call the main number to the clinic [phone] and do not press any options, hold on the line and a nurse will answer the phone.    For prescription refill requests, have your pharmacy contact our office and allow 72 hours.    Due to Covid, you will need to wear a mask upon entering the hospital. If you do not have a mask, a mask will be given to you at the Main Entrance upon arrival. For doctor visits, patients may have 1 support person age 18  or older with them. For treatment visits, patients can not have anyone with them due to social distancing guidelines and our immunocompromised population.     

## 2020-08-10 NOTE — Patient Instructions (Signed)
[place] at [facility] Discharge Instructions  You were seen today by Dr. Delton Coombes. He went over your recent results. You received your treatment today. Continue eating caloric-dense meals or drink 3-4 cans of Boost or Ensure daily to maintain your weight. Dr. Delton Coombes will see you back in 1 week for labs and follow up.   Thank you for choosing [place] at [facility] to provide your oncology and hematology care.  To afford each patient quality time with our provider, please arrive at least 15 minutes before your scheduled appointment time.   If you have a lab appointment with the [place] please come in thru the Main Entrance and check in at the main information desk  You need to re-schedule your appointment should you arrive 10 or more minutes late.  We strive to give you quality time with our providers, and arriving late affects you and other patients whose appointments are after yours.  Also, if you no show three or more times for appointments you may be dismissed from the clinic at the providers discretion.     Again, thank you for choosing [facility].  Our hope is that these requests will decrease the amount of time that you wait before being seen by our physicians.       _____________________________________________________________  Should you have questions after your visit to [facility], please contact our office at [phone] between the hours of 8:00 a.m. and 4:30 p.m.  Voicemails left after 4:00 p.m. will not be returned until the following business day.  For prescription refill requests, have your pharmacy contact our office and allow 72 hours.    Cancer Center Support Programs:   > Cancer Support Group  2nd Tuesday of the month 1pm-2pm, Journey Room

## 2020-08-10 NOTE — Progress Notes (Signed)
Daniel Richards, [place] [id number]   CLINIC:  Medical Oncology/Hematology  PCP:  [name], FNP [name] [address] [place] [address]* [phone]   REASON FOR VISIT:  Follow-up for stage IV DLBCL  PRIOR THERAPY:  1. R mini CHOP x 6 cycles from 08/21/2019 to 01/21/2020. 2. Radiation therapy x 10 treatments from 05/27/2020 to 06/09/2020.  NGS Results: Not done  CURRENT THERAPY: Monjuvi weekly  BRIEF ONCOLOGIC HISTORY:  Oncology History  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, unspecified B-cell lymphoma type   08/04/2019 Initial Diagnosis   B-cell lymphoma ([place])   08/16/2019 Genetic Testing   Fish Analysis High-Grade / Large B-Cell Lymphoma FISH      08/20/2019 - 01/23/2020 Chemotherapy   The patient had DOXOrubicin (ADRIAMYCIN) chemo injection 52 mg, 25 mg/m2 = 52 mg (50 % of original dose 50 mg/m2), Intravenous,  Once, 6 of 6 cycles Dose modification: 25 mg/m2 (50 % of original dose 50 mg/m2, Cycle 1, Reason: Provider Judgment) Administration: 52 mg (09/10/2019), 52 mg (08/21/2019), 52 mg (11/20/2019), 52 mg (12/10/2019), 52 mg (12/31/2019), 52 mg (01/21/2020) palonosetron (ALOXI) injection 0.25 mg, 0.25 mg, Intravenous,  Once, 6 of 6 cycles Administration: 0.25 mg (09/10/2019), 0.25 mg (08/21/2019), 0.25 mg (11/20/2019), 0.25 mg (12/10/2019), 0.25 mg (12/31/2019), 0.25 mg (01/21/2020) pegfilgrastim-jmdb (FULPHILA) injection 6 mg, 6 mg, Subcutaneous,  Once, 6 of 6 cycles Administration: 6 mg (08/22/2019), 6 mg (09/12/2019), 6 mg (11/21/2019), 6 mg (12/12/2019), 6 mg (01/02/2020), 6 mg (01/23/2020) vinCRIStine (ONCOVIN) 1 mg in sodium chloride 0.9 % 50 mL chemo infusion, 1 mg (50 % of original dose 2 mg), Intravenous,  Once, 6 of 6 cycles Dose modification: 1 mg (50 % of original dose 2 mg, Cycle 1, Reason: Provider Judgment) Administration: 1 mg (09/10/2019), 1 mg (08/21/2019), 1 mg (11/20/2019), 1  mg (12/10/2019), 1 mg (12/31/2019), 1 mg (01/21/2020) cyclophosphamide (CYTOXAN) 1,040 mg in sodium chloride 0.9 % 250 mL chemo infusion, 500 mg/m2 = 1,040 mg (66.7 % of original dose 750 mg/m2), Intravenous,  Once, 6 of 6 cycles Dose modification: 500 mg/m2 (66.7 % of original dose 750 mg/m2, Cycle 1, Reason: Provider Judgment), 500 mg/m2 (original dose 750 mg/m2, Cycle 5, Reason: Provider Judgment) Administration: 1,000 mg (09/10/2019), 1,000 mg (08/21/2019), 1,000 mg (12/10/2019), 1,000 mg (12/31/2019), 1,000 mg (01/21/2020) fosaprepitant (EMEND) 150 mg, dexamethasone (DECADRON) 12 mg in sodium chloride 0.9 % 145 mL IVPB, , Intravenous,  Once, 6 of 6 cycles Administration:  (09/10/2019),  (08/21/2019),  (11/20/2019),  (12/10/2019),  (12/31/2019),  (01/21/2020) riTUXimab-pvvr (RUXIENCE) 800 mg in sodium chloride 0.9 % 250 mL (2.4242 mg/mL) infusion, 375 mg/m2 = 800 mg, Intravenous,  Once, 6 of 6 cycles Administration: 800 mg (08/20/2019), 800 mg (09/10/2019), 800 mg (11/20/2019), 800 mg (12/10/2019), 800 mg (12/31/2019), 800 mg (01/21/2020)  for chemotherapy treatment.    07/27/2020 -  Chemotherapy   The patient had lenalidomide (REVLIMID) 10 MG capsule, 10 mg, Oral, Daily, 1 of 1 cycle, Start date: --, End date: -- tafasitamab-cxix (MONJUVI) 1,000 mg in sodium chloride 0.9 % 225 mL (4 mg/mL) chemo infusion, 12 mg/kg = 1,000 mg, Intravenous,  Once, 1 of 13 cycles Administration: 1,000 mg (07/27/2020), 1,000 mg (08/03/2020)  for chemotherapy treatment.    Diffuse large B cell lymphoma ([place])  08/16/2019 Genetic Testing   Fish Analysis High-Grade / Large B-Cell Lymphoma FISH      08/20/2019 Initial Diagnosis   Diffuse large B cell  lymphoma ([place])   08/20/2019 Cancer Staging   Staging form: Hodgkin and Non-Hodgkin Lymphoma, AJCC 8th Edition - Clinical stage from 08/20/2019: Stage IV (Diffuse large B-cell lymphoma) - Signed by Derek Jack, MD on 08/20/2019   07/27/2020 -  Chemotherapy   The patient had  lenalidomide (REVLIMID) 10 MG capsule, 10 mg, Oral, Daily, 1 of 1 cycle, Start date: --, End date: -- tafasitamab-cxix (MONJUVI) 1,000 mg in sodium chloride 0.9 % 225 mL (4 mg/mL) chemo infusion, 12 mg/kg = 1,000 mg, Intravenous,  Once, 1 of 13 cycles Administration: 1,000 mg (07/27/2020), 1,000 mg (08/03/2020)  for chemotherapy treatment.      CANCER STAGING: Cancer Staging Diffuse large B cell lymphoma ([place]) Staging form: Hodgkin and Non-Hodgkin Lymphoma, AJCC 8th Edition - Clinical stage from 08/20/2019: Stage IV (Diffuse large B-cell lymphoma) - Signed by Derek Jack, MD on 08/20/2019   INTERVAL HISTORY:  Daniel Richards, a 74 y.o. male, returns for routine follow-up and consideration for next cycle of immunotherapy. Daniel Richards was last seen on 07/27/2020.  Due for day #15 of cycle #1 of Monjuvi today.   Today he is accompanied by his son. Overall, he tells me he has been feeling poorly and being treated poorly at the nursing home. He is not doing PT there yet. His appetite is good but has not eaten well in the past 3 days and is feeling fatigued; he denies N/V or abdominal pain. The pain in his left foot and leg is doing better and he is able to raise it slightly. He started taking Revlimid last week.  Overall, he does not feel ready for next cycle of immunotherapy today.    REVIEW OF SYSTEMS:  Review of Systems  Constitutional: Positive for appetite change (25%) and fatigue (depleted).  Respiratory: Positive for cough (d/t allergies).   Cardiovascular: Positive for chest pain (chest aches).  Gastrointestinal: Negative for abdominal pain, nausea and vomiting.  Musculoskeletal: Positive for arthralgias (10/10 L foot and leg pain).  All other systems reviewed and are negative.   PAST MEDICAL/SURGICAL HISTORY:  Past Medical History:  Diagnosis Date   Acid reflux    Anxiety and depression    Cancer ([place])    lymphoma   Depression    Diabetes ([place])    Diabetes  mellitus 06/09/2011   pt taking metformin   DJD of shoulder    right    Hyperlipidemia    Hypertension    Lymphoma ([place])    Migraines    Port-A-Cath in place 08/13/2019   Pulmonary embolism ([place])    Stomach ulcer    Tremor    Tremor    Vitamin D deficiency    Past Surgical History:  Procedure Laterality Date   BACK SURGERY     KNEE SURGERY     PORTACATH PLACEMENT Left 08/08/2019   Procedure: INSERTION PORT-A-CATH;  Surgeon: Aviva Signs, MD;  Location: [facility];  Service: General;  Laterality: Left;   SHOULDER SURGERY      SOCIAL HISTORY:  Social History   Socioeconomic History   Marital status: Divorced    Spouse name: Not on file   Number of children: 2   Years of education: 12+   Highest education level: Not on file  Occupational History   Not on file  Tobacco Use   Smoking status: Former Smoker    Packs/day: 2.00    Years: 20.00    Pack years: 40.00    Types: Cigarettes    Quit date: 11/17/1989  Years since quitting: 30.7   Smokeless tobacco: Never Used  Vaping Use   Vaping Use: Never used  Substance and Sexual Activity   Alcohol use: No   Drug use: No   Sexual activity: Not Currently  Other Topics Concern   Not on file  Social History Narrative   Lives at home with son.   Caffeine use: Drinks 1 cup coffee (3 cups per week-decaf)   Social Determinants of Health   Financial Resource Strain:    Difficulty of Paying Living Expenses: Not on file  Food Insecurity:    Worried About Charity fundraiser in the Last Year: Not on file   [employer] of Food in the Last Year: Not on file  Transportation Needs:    Lack of Transportation (Medical): Not on file   Lack of Transportation (Non-Medical): Not on file  Physical Activity:    Days of Exercise per Week: Not on file   Minutes of Exercise per Session: Not on file  Stress:    Feeling of Stress : Not on file  Social Connections:    Frequency of Communication with Friends  and Family: Not on file   Frequency of Social Gatherings with Friends and Family: Not on file   Attends Religious Services: Not on file   Active Member of Clubs or Organizations: Not on file   Attends Archivist Meetings: Not on file   Marital Status: Not on file  Intimate Partner Violence:    Fear of Current or Ex-Partner: Not on file   Emotionally Abused: Not on file   Physically Abused: Not on file   Sexually Abused: Not on file    FAMILY HISTORY:  Family History  Problem Relation Age of Onset   Cancer Sister    Cancer Sister    Cancer Brother    Heart attack Father    Cancer Brother    Heart attack Brother    Healthy Son    Healthy Daughter    Migraines Neg Hx     CURRENT MEDICATIONS:  Current Outpatient Medications  Medication Sig Dispense Refill   allopurinol (ZYLOPRIM) 300 MG tablet Take 300 mg by mouth daily.     apixaban (ELIQUIS) 5 MG TABS tablet Take 5 mg by mouth 2 (two) times daily.     cyclobenzaprine (FLEXERIL) 10 MG tablet Take 1 tablet (10 mg total) by mouth at bedtime. 30 tablet 0   dapagliflozin propanediol (FARXIGA) 10 MG TABS tablet Take 10 mg by mouth daily.     gabapentin (NEURONTIN) 300 MG capsule Take 600 mg by mouth 3 (three) times daily.     metFORMIN (GLUCOPHAGE) 500 MG tablet Take 500 mg by mouth 2 (two) times daily with a meal.     metoprolol succinate (TOPROL-XL) 50 MG 24 hr tablet Take 75 mg by mouth daily. Take with or immediately following a meal.      mirtazapine (REMERON) 30 MG tablet Take 1 tablet (30 mg total) by mouth at bedtime.     pantoprazole (PROTONIX) 40 MG tablet Take 40 mg by mouth daily.     potassium chloride (KLOR-CON) 20 MEQ packet Take 20 mEq by mouth daily.     primidone (MYSOLINE) 50 MG tablet Take 1.5 tablets (75 mg total) by mouth every 8 (eight) hours. (Patient taking differently: Take 100 mg by mouth 2 (two) times daily. ) 150 tablet 0   REVLIMID 10 MG capsule Take by mouth.       temazepam (RESTORIL)  15 MG capsule Take 1 capsule (15 mg total) by mouth at bedtime. 15 capsule 0   topiramate (TOPAMAX) 200 MG tablet Take 200 mg by mouth at bedtime.     No current facility-administered medications for this visit.   Facility-Administered Medications Ordered in Other Visits  Medication Dose Route Frequency Provider Last Rate Last Admin   0.9 %  sodium chloride infusion   Intravenous Continuous Derek Jack, MD 500 mL/hr at 08/10/20 1026 New Bag at 08/10/20 1026    ALLERGIES:  Allergies  Allergen Reactions   Vancomycin Hives    Patient received Benadryl to treat the hives    PHYSICAL EXAM:  Performance status (ECOG): 2 - Symptomatic, <50% confined to bed  Vitals:   08/10/20 0907 08/10/20 1018  BP:    Pulse: (!) 104 (!) 130  Resp:    Temp:    SpO2:     Wt Readings from Last 3 Encounters:  08/09/20 178 lb 3.2 oz (80.8 kg)  07/30/20 176 lb 5.9 oz (80 kg)  07/27/20 176 lb 12.9 oz (80.2 kg)   Physical Exam Vitals reviewed.  Constitutional:      Appearance: Normal appearance.  Chest:     Comments: Port-a-Cath in L chest Neurological:     General: No focal deficit present.     Mental Status: He is oriented to person, place, and time. He is lethargic.  Psychiatric:        Mood and Affect: Mood normal.        Behavior: Behavior normal.     LABORATORY DATA:  I have reviewed the labs as listed.  CBC Latest Ref Rng & Units 08/10/2020 08/03/2020 08/03/2020  WBC 4.0 - 10.5 K/uL 5.6 5.6 5.0  Hemoglobin 13.0 - 17.0 g/dL 7.9(L) 8.6(L) 8.3(L)  Hematocrit 39 - 52 % 26.2(L) 27.7(L) 28.0(L)  Platelets 150 - 400 K/uL 246 317 282   CMP Latest Ref Rng & Units 08/10/2020 08/03/2020 08/03/2020  Glucose 70 - 99 mg/dL 108(H) 104(H) -  BUN 8 - 23 mg/dL 48(H) 18 -  Creatinine 0.61 - 1.24 mg/dL 3.58(H) 1.48(H) -  Sodium 135 - 145 mmol/L 134(L) 137 -  Potassium 3.5 - 5.1 mmol/L 4.2 4.0 -  Chloride 98 - 111 mmol/L 104 109 -  CO2 22 - 32 mmol/L 18(L) 18(L) -   Calcium 8.9 - 10.3 mg/dL 10.2 9.2 -  Total Protein 6.5 - 8.1 g/dL 6.5 6.8 6.6  Total Bilirubin 0.3 - 1.2 mg/dL 1.1 0.9 0.6  Alkaline Phos 38 - 126 U/L 103 154(H) 150(H)  AST 15 - 41 U/L 113(H) 58(H) 52(H)  ALT 0 - 44 U/L 52(H) 39 37   Lab Results  Component Value Date   LDH 377 (H) 08/10/2020   LDH 263 (H) 08/03/2020   LDH 164 07/27/2020    DIAGNOSTIC IMAGING:  I have independently reviewed the scans and discussed with the patient. MR PELVIS W WO CONTRAST  Addendum Date: 07/19/2020   ADDENDUM REPORT: 07/19/2020 16:24 ADDENDUM: Dictation error in the musculoskeletal findings section above. Multiple lesions in the bilateral bony pelvis and proximal FEMURS, measuring up to 9 mm on the left, suggesting metastases/lymphomatous involvement. Remainder of the report, including the impression, is unchanged. Electronically Signed   By: Julian Hy M.D.   On: 07/19/2020 16:24   Result Date: 07/19/2020 CLINICAL DATA:  Diffuse large B-cell lymphoma, pelvic lymphadenopathy EXAM: MRI PELVIS WITHOUT AND WITH CONTRAST TECHNIQUE: Multiplanar multisequence MR imaging of the pelvis was performed both before and after  administration of intravenous contrast. CONTRAST:  70mL GADAVIST GADOBUTROL 1 MMOL/ML IV SOLN COMPARISON:  MRI pelvis dated 05/31/2020 FINDINGS: Urinary Tract: Irregular bladder wall thickening. Indwelling Foley catheter. Bowel: Wall thickening involving the left posterolateral rectum (series 15/image 29), new from the prior, likely reflecting tumor involvement. Vascular/Lymphatic: No evidence of aneurysm. Multifocal tumor along the left posterior pelvic sidewall. In aggregate, this measures 6.0 x 2.3 cm (series 11/image 22), previously 6.2 x 4.4 cm, improved. However, this likely reflects several smaller discrete nodes, including a 16 mm short axis node superiorly (series 12/image 19) and inferiorly (series 12/image 21). Tumor continues to involve the exiting S1-3 nerve roots on the left,  particularly S2. New 15 mm short axis left external iliac node (series 12/image 20). New 11 mm short axis left deep inguinal node (series 12/image 23). Retroperitoneal lymphadenopathy, incompletely visualized, including a 2.0 cm short axis right common iliac node and a 1.9 cm short axis left common iliac node (series 12/image 8). Reproductive:  Prostatomegaly, suggesting BPH. Other:  No pelvic ascites. Musculoskeletal: Tumor involvement of the left iliopsoas musculature (series 15/images 1 and 5). Multiple lesions in the bilateral bony pelvis and proximal humerii, measuring up to 9 mm on the left (series 10/image 10), suggesting metastases/lymphomatous involvement. IMPRESSION: Retroperitoneal, left posterior pelvic, and left inguinal lymphadenopathy, as described above, progressive. Tumor likely involves the left iliopsoas musculature. Tumor continues to involve the exiting S1-3 nerve roots on the left, particularly S2. Tumor likely involves the left posterolateral rectum. Irregular bladder wall thickening, poorly evaluated. Indwelling Foley catheter. Suspected multifocal osseous metastases/lymphomatous involvement. Correlate with pending PET-CT. Electronically Signed: By: Julian Hy M.D. On: 07/19/2020 10:37   NM PET Image Initial (PI) Skull Base To Thigh  Result Date: 07/19/2020 CLINICAL DATA:  Subsequent treatment strategy for diffuse large B-cell lymphoma. EXAM: NUCLEAR MEDICINE PET SKULL BASE TO THIGH TECHNIQUE: 8.8 mCi F-18 FDG was injected intravenously. Full-ring PET imaging was performed from the skull base to thigh after the radiotracer. CT data was obtained and used for attenuation correction and anatomic localization. Fasting blood glucose: 113 mg/dl COMPARISON:  MR pelvis dated 07/20/2019.  PET-CT dated 04/26/2020. FINDINGS: Mediastinal blood pool activity: SUV max 3.0 Liver activity: SUV max 4.6 NECK: 2.7 cm right pharyngeal lesion (series 4/image 20), max SUV 11.2, new. Numerous right  cervical nodes, including a 10 mm short axis right level 2 node (series 4/image 29), max SUV 5.6, new. Incidental CT findings: none CHEST: No hypermetabolic thoracic lymphadenopathy. No suspicious pulmonary nodules. Left chest port terminates in the lower SVC. Incidental CT findings: Biapical pleural-parenchymal scarring. Mild centrilobular and paraseptal emphysematous changes, upper lung predominant. Atherosclerotic calcifications of the aortic arch. Three vessel coronary atherosclerosis. ABDOMEN/PELVIS: 1.9 cm metastasis inferiorly in the right hepatic lobe (series 4/image 124), max SUV 11.0, new. Reference splenic hypermetabolism max SUV 3.6, without focal lesion. No abnormal hypermetabolism in the pancreas or adrenal glands. Retroperitoneal/pelvic lymphadenopathy, including: --15 mm short axis node at the aortic bifurcation (series 4/image 152), max SUV 21.5, new --19 mm short axis right common iliac node (series 4/image 156), max SUV 17.0, new --14 mm short axis left external iliac node (series 4/image 172), max SUV 15.6, new --6.1 x 2.3 cm nodal mass along the left posterior pelvic sidewall (series 4/image 175), similar to prior pelvic MR (July 2021) but progressive from prior PET, max SUV 17.5, previously 10.7 Abnormal soft tissue along the left posterior aspect of the rectum (series 4/image 26), max SUV 15.6, new. Irregular masslike wall thickening involving  the bladder (series 4/image 180), raising concern for underlying neoplasm. Bladder is decompressed by indwelling Foley catheter with associated nondependent gas. This is poorly evaluated on PET due to excretory radiotracer. Incidental CT findings: Left renal cysts. Layering bladder calculi. Mild pelvic mesenteric stranding. SKELETON: Tumor involvement of the left iliopsoas musculature, max SUV 19.5, new. Additional involvement of the medial right psoas muscle, max SUV 18.6, new. Multifocal osseous metastases involving the visualized axial and  appendicular skeleton, including the bilateral proximal humerii, right acromion and scapula, multiple right ribs, multiple thoracolumbar vertebral bodies, bilateral pelvis, and bilateral proximal femurs. Representative lesions include: --Left T11 vertebral body, max SUV 10.2 --Right L3 vertebral body, max SUV 9.8 --Left iliac crest, max SUV 21.5 Incidental CT findings: Degenerative changes of the visualized thoracolumbar spine. IMPRESSION: Progressive retroperitoneal/pelvic lymphadenopathy, as described above. Associated involvement of the bilateral iliopsoas musculature as well as the left posterolateral rectum, new. New hepatic metastasis. Irregular masslike wall thickening involving the bladder, raising concern for underlying neoplasm. Consider cystoscopy if clinically warranted. Right pharyngeal lesion, new. This presumably reflects lymphoma but is amenable to visual inspection and sampling if there is concern for primary head/neck cancer. Associated cervical nodal metastases. Multifocal osseous metastases/lymphomatous involvement in the visualized axial and appendicular skeleton. Electronically Signed   By: Julian Hy M.D.   On: 07/19/2020 16:22   DG Knee Complete 4 Views Left  Result Date: 07/14/2020 CLINICAL DATA:  Left knee pain.  Recurrent falls. EXAM: LEFT KNEE - COMPLETE 4+ VIEW COMPARISON:  None. FINDINGS: Mild tricompartmental peripheral spurring. The joint spaces are preserved. Trace joint effusion. There is a 3 cm well-defined osseous projection from the lateral femoral metaphysis directed away from the joint with cortical and medullary continuity consistent with osteochondroma. No fracture, erosion or bony destruction. IMPRESSION: 1. Mild tricompartmental osteoarthritis with trace joint effusion. No acute fracture. 2. Elongated 3 cm osteochondroma from the lateral femur. In the absence of focal pain in this region, no dedicated further imaging is needed. If there is focal pain, consider  further evaluation with MRI. Electronically Signed   By: Keith Rake M.D.   On: 07/14/2020 22:27   DG HIP UNILAT WITH PELVIS 2-3 VIEWS LEFT  Result Date: 07/14/2020 CLINICAL DATA:  Left hip pain.  Recurrent falls. EXAM: DG HIP (WITH OR WITHOUT PELVIS) 2-3V LEFT COMPARISON:  None. FINDINGS: Mild osteoarthritis of the left hip with minimal joint space narrowing and femoral head spurring. Mild right hip osteoarthritis also seen. Pubic rami are intact. The sacroiliac joints are congruent. There is no evidence of fracture, avascular necrosis, focal bone lesion or bony destruction. Soft tissues are unremarkable. IMPRESSION: Mild osteoarthritis of the left hip. Electronically Signed   By: Keith Rake M.D.   On: 07/14/2020 22:24     ASSESSMENT:  1. Stage IV DLBCL: -6 cycles of R mini CHOP from 08/21/2019 through 01/21/2020. -PET scan on 04/26/2020 showed slight interval enlargement of hypermetabolic nodule in the deep left pelvis measuring 27 mm, SUV 10.4 increased in size and metabolic activity. 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 -MRI pelvis with6.2 x 4.4 x 3.9 cm soft tissue mass along the posterior left pelvic sidewall and presacral space has increased slightly in size from 05/12/2020. Tumor is extending into S2 foramen causing attenuation and abnormal signal in the S2 root consistent with tumor involvement. Tumor may minimally involve S1 root and S3 to concerning for involvement. -XRT to the left pelvic nodule completed on 06/09/2020,  10 treatments. -PET scan on 07/19/2020 showed progressive retroperitoneal/pelvic lymphadenopathy with involvement of bilateral iliopsoas musculature, new solitary hepatic metastasis. Multifocal bone lesions present. -MRI of the pelvis on 07/19/2020 also showed multiple lesions in the bilateral bony pelvis and proximal femurs, adenopathy. -Monjuvi started on 07/27/2020, Revlimid 10 mg 3 weeks on 1 week off  started on 08/02/2020.  2. Pulmonary embolism: -Diagnosed with bilateral pulmonary embolism on 08/26/2019. He is on Eliquis.   PLAN:  1. Stage IV DLBCL: -He is here for day 19 of Mnjuvi. -His labs indicate creatinine has increased to 3.58 with BUN of 48. -He reports that he has not been eating well or drinking well.  He does not like the taste of the food. -We will hold treatment today.  He will be given 1 and half liters of fluid. -Corrected calcium is around 11.  His also likely developing hypercalcemia again. -We will evaluate him Thursday and repeat his labs.  If calcium continues to go up we will consider Zometa along with his treatment. -We will also hold Revlimid at this time until his creatinine improves.  2. Pulmonary embolism: -Continue Eliquis.  No bleeding issues.  3.  Lower back and left hip pain: -His left leg pain has improved.  He is able to move left leg without any problem.  4. Urinary problems: -He has chronic indwelling catheter.  He had an output of 250 mL in the bag.  5. Macrocytic anemia: -Combination anemia from CKD and lymphoma.  Hemoglobin today 7.9.  6.  Malignant hypercalcemia: -This has resolved after Zometa at last visit.  His calcium increased 11 today.  We will closely monitor.  He will receive fluids today.  We will check it on Thursday.   Orders placed this encounter:  No orders of the defined types were placed in this encounter.    Derek Jack, MD [place] [phone]   I, Milinda Antis, am acting as a scribe for Dr. Sanda Linger.  I, Derek Jack MD, have reviewed the above documentation for accuracy and completeness, and I agree with the above.

## 2020-08-10 NOTE — Progress Notes (Signed)
Holding treatment at this time, will give a saline bolus over 2 hours per MD.   Holding treatment today. Will give additional 528ml Normal saline over one hour per MD.   Vitals stable, discharge from clinic via stretcher by ambulance back to the [facility] [place] in stable condition. Family aware of plan for future appointments.

## 2020-08-12 ENCOUNTER — Inpatient Hospital Stay (HOSPITAL_COMMUNITY): Payer: Medicare Other

## 2020-08-12 ENCOUNTER — Other Ambulatory Visit: Payer: Self-pay

## 2020-08-12 ENCOUNTER — Encounter (HOSPITAL_COMMUNITY): Payer: Self-pay

## 2020-08-12 VITALS — BP 105/57 | HR 122 | Temp 96.9°F | Resp 18

## 2020-08-12 DIAGNOSIS — C8513 Unspecified B-cell lymphoma, intra-abdominal lymph nodes: Secondary | ICD-10-CM

## 2020-08-12 DIAGNOSIS — C8336 Diffuse large B-cell lymphoma, intrapelvic lymph nodes: Secondary | ICD-10-CM

## 2020-08-12 DIAGNOSIS — C8338 Diffuse large B-cell lymphoma, lymph nodes of multiple sites: Secondary | ICD-10-CM

## 2020-08-12 DIAGNOSIS — Z5111 Encounter for antineoplastic chemotherapy: Secondary | ICD-10-CM | POA: Diagnosis not present

## 2020-08-12 DIAGNOSIS — Z95828 Presence of other vascular implants and grafts: Secondary | ICD-10-CM

## 2020-08-12 LAB — CBC WITH DIFFERENTIAL/PLATELET
Abs Immature Granulocytes: 0.09 10*3/uL — ABNORMAL HIGH (ref 0.00–0.07)
Basophils Absolute: 0 10*3/uL (ref 0.0–0.1)
Basophils Relative: 0 %
Eosinophils Absolute: 0.2 10*3/uL (ref 0.0–0.5)
Eosinophils Relative: 4 %
HCT: 24.9 % — ABNORMAL LOW (ref 39.0–52.0)
Hemoglobin: 7.7 g/dL — ABNORMAL LOW (ref 13.0–17.0)
Immature Granulocytes: 2 %
Lymphocytes Relative: 15 %
Lymphs Abs: 0.6 10*3/uL — ABNORMAL LOW (ref 0.7–4.0)
MCH: 32 pg (ref 26.0–34.0)
MCHC: 30.9 g/dL (ref 30.0–36.0)
MCV: 103.3 fL — ABNORMAL HIGH (ref 80.0–100.0)
Monocytes Absolute: 0.3 10*3/uL (ref 0.1–1.0)
Monocytes Relative: 6 %
Neutro Abs: 3.2 10*3/uL (ref 1.7–7.7)
Neutrophils Relative %: 73 %
Platelets: 201 10*3/uL (ref 150–400)
RBC: 2.41 MIL/uL — ABNORMAL LOW (ref 4.22–5.81)
RDW: 15.1 % (ref 11.5–15.5)
WBC: 4.4 10*3/uL (ref 4.0–10.5)
nRBC: 0 % (ref 0.0–0.2)

## 2020-08-12 LAB — COMPREHENSIVE METABOLIC PANEL
ALT: 90 U/L — ABNORMAL HIGH (ref 0–44)
AST: 148 U/L — ABNORMAL HIGH (ref 15–41)
Albumin: 2.7 g/dL — ABNORMAL LOW (ref 3.5–5.0)
Alkaline Phosphatase: 125 U/L (ref 38–126)
Anion gap: 11 (ref 5–15)
BUN: 52 mg/dL — ABNORMAL HIGH (ref 8–23)
CO2: 19 mmol/L — ABNORMAL LOW (ref 22–32)
Calcium: 10.3 mg/dL (ref 8.9–10.3)
Chloride: 104 mmol/L (ref 98–111)
Creatinine, Ser: 3.92 mg/dL — ABNORMAL HIGH (ref 0.61–1.24)
GFR calc non Af Amer: 14 mL/min — ABNORMAL LOW (ref >60)
Glucose, Bld: 169 mg/dL — ABNORMAL HIGH (ref 70–99)
Potassium: 4.1 mmol/L (ref 3.5–5.1)
Sodium: 134 mmol/L — ABNORMAL LOW (ref 135–145)
Total Bilirubin: 0.3 mg/dL (ref 0.3–1.2)
Total Protein: 6.7 g/dL (ref 6.5–8.1)

## 2020-08-12 LAB — MAGNESIUM: Magnesium: 2.1 mg/dL (ref 1.7–2.4)

## 2020-08-12 LAB — LACTATE DEHYDROGENASE: LDH: 406 U/L — ABNORMAL HIGH (ref 98–192)

## 2020-08-12 LAB — URIC ACID: Uric Acid, Serum: 4 mg/dL (ref 3.7–8.6)

## 2020-08-12 MED ORDER — TAFASITAMAB-CXIX CHEMO INJECTION 200 MG
12.0000 mg/kg | Freq: Once | INTRAVENOUS | Status: AC
  Administered 2020-08-12: 1000 mg via INTRAVENOUS
  Filled 2020-08-12: qty 25

## 2020-08-12 MED ORDER — DIPHENHYDRAMINE HCL 50 MG/ML IJ SOLN
50.0000 mg | Freq: Once | INTRAMUSCULAR | Status: AC
  Administered 2020-08-12: 50 mg via INTRAVENOUS

## 2020-08-12 MED ORDER — PROCHLORPERAZINE MALEATE 10 MG PO TABS
10.0000 mg | ORAL_TABLET | Freq: Once | ORAL | Status: AC
  Administered 2020-08-12: 10 mg via ORAL
  Filled 2020-08-12: qty 1

## 2020-08-12 MED ORDER — DIPHENHYDRAMINE HCL 50 MG/ML IJ SOLN
INTRAMUSCULAR | Status: AC
  Filled 2020-08-12: qty 1

## 2020-08-12 MED ORDER — SODIUM CHLORIDE 0.9 % IV SOLN
20.0000 mg | Freq: Once | INTRAVENOUS | Status: AC
  Administered 2020-08-12: 20 mg via INTRAVENOUS
  Filled 2020-08-12: qty 20

## 2020-08-12 MED ORDER — FAMOTIDINE IN NACL 20-0.9 MG/50ML-% IV SOLN
20.0000 mg | Freq: Once | INTRAVENOUS | Status: AC
  Administered 2020-08-12: 20 mg via INTRAVENOUS

## 2020-08-12 MED ORDER — HEPARIN SOD (PORK) LOCK FLUSH 100 UNIT/ML IV SOLN
500.0000 [IU] | Freq: Once | INTRAVENOUS | Status: AC | PRN
  Administered 2020-08-12: 500 [IU]

## 2020-08-12 MED ORDER — ZOLEDRONIC ACID 4 MG/5ML IV CONC
3.0000 mg | Freq: Once | INTRAVENOUS | Status: AC
  Administered 2020-08-12: 3 mg via INTRAVENOUS
  Filled 2020-08-12: qty 3.75

## 2020-08-12 MED ORDER — ACETAMINOPHEN 325 MG PO TABS
ORAL_TABLET | ORAL | Status: AC
  Filled 2020-08-12: qty 2

## 2020-08-12 MED ORDER — FAMOTIDINE IN NACL 20-0.9 MG/50ML-% IV SOLN
INTRAVENOUS | Status: AC
  Filled 2020-08-12: qty 50

## 2020-08-12 MED ORDER — ACETAMINOPHEN 325 MG PO TABS
650.0000 mg | ORAL_TABLET | Freq: Once | ORAL | Status: AC
  Administered 2020-08-12: 650 mg via ORAL

## 2020-08-12 MED ORDER — SODIUM CHLORIDE 0.9 % IV SOLN: Freq: Once | INTRAVENOUS | Status: AC

## 2020-08-12 MED ORDER — SODIUM CHLORIDE 0.9% FLUSH
10.0000 mL | INTRAVENOUS | Status: DC | PRN
  Administered 2020-08-12: 10 mL

## 2020-08-12 NOTE — Progress Notes (Signed)
Patient presents today for treatment. Heart rate on arrival 120. Recheck 98 manual. Foley catheter in place and patient has complaints of pain at the insertion site. MAR reviewed and updated. Patient has no complaints of any changes since his last visit. Revlimid 10 mg taking as prescribed with no side issues.   Treatment given today per MD orders. Tolerated infusion without adverse affects. Vital signs stable. No complaints at this time. Discharged from clinic via [facility] in stable condition. Alert and oriented x 3. F/U with [facility] as scheduled.

## 2020-08-12 NOTE — Patient Instructions (Signed)
[facility] Discharge Instructions for Patients Receiving Chemotherapy  Today you received the following chemotherapy agents   To help prevent nausea and vomiting after your treatment, we encourage you to take your nausea medication   If you develop nausea and vomiting that is not controlled by your nausea medication, call the clinic.   BELOW ARE SYMPTOMS THAT SHOULD BE REPORTED IMMEDIATELY:  *FEVER GREATER THAN 100.5 F  *CHILLS WITH OR WITHOUT FEVER  NAUSEA AND VOMITING THAT IS NOT CONTROLLED WITH YOUR NAUSEA MEDICATION  *UNUSUAL SHORTNESS OF BREATH  *UNUSUAL BRUISING OR BLEEDING  TENDERNESS IN MOUTH AND THROAT WITH OR WITHOUT PRESENCE OF ULCERS  *URINARY PROBLEMS  *BOWEL PROBLEMS  UNUSUAL RASH Items with * indicate a potential emergency and should be followed up as soon as possible.  Feel free to call the clinic should you have any questions or concerns. The clinic phone number is [phone].  Please show the CHEMO ALERT CARD at check-in to the Emergency Department and triage nurse.   

## 2020-08-12 NOTE — Patient Instructions (Signed)
[facility] Discharge Instructions  Labs drawn from portacath today   Thank you for choosing [facility] at [facility] to provide your oncology and hematology care.  To afford each patient quality time with our provider, please arrive at least 15 minutes before your scheduled appointment time.   If you have a lab appointment with the Cancer Center please come in thru the Main Entrance and check in at the main information desk.  You need to re-schedule your appointment should you arrive 10 or more minutes late.  We strive to give you quality time with our providers, and arriving late affects you and other patients whose appointments are after yours.  Also, if you no show three or more times for appointments you may be dismissed from the clinic at the providers discretion.     Again, thank you for choosing [facility].  Our hope is that these requests will decrease the amount of time that you wait before being seen by our physicians.       _____________________________________________________________  Should you have questions after your visit to [facility], please contact our office at [phone] and follow the prompts.  Our office hours are 8:00 a.m. and 4:30 p.m. Monday - Friday.  Please note that voicemails left after 4:00 p.m. may not be returned until the following business day.  We are closed weekends and major holidays.  You do have access to a nurse 24-7, just call the main number to the clinic [phone] and do not press any options, hold on the line and a nurse will answer the phone.    For prescription refill requests, have your pharmacy contact our office and allow 72 hours.    Due to Covid, you will need to wear a mask upon entering the hospital. If you do not have a mask, a mask will be given to you at the Main Entrance upon arrival. For doctor visits, patients may have 1 support person age 18  or older with them. For treatment visits, patients can not have anyone with them due to social distancing guidelines and our immunocompromised population.     

## 2020-08-13 ENCOUNTER — Other Ambulatory Visit (HOSPITAL_COMMUNITY): Payer: Self-pay | Admitting: *Deleted

## 2020-08-13 DIAGNOSIS — I4819 Other persistent atrial fibrillation: Secondary | ICD-10-CM

## 2020-08-16 ENCOUNTER — Telehealth (HOSPITAL_COMMUNITY): Payer: Self-pay | Admitting: Surgery

## 2020-08-16 NOTE — Telephone Encounter (Signed)
The pt's daughter called this morning to express her concerns about her father having treatment tomorrow.  She said after his last treatment that he had hallucinations, and that he was not alert and having trouble swallowing.  She also stated that he as not eaten much the last few days.  Dr. Delton Coombes was made aware of the situation and said that he would personally call the pt's daughter.  I called the pt's daughter to let her know and she verbalized understanding.

## 2020-08-17 ENCOUNTER — Telehealth: Payer: Self-pay

## 2020-08-17 ENCOUNTER — Ambulatory Visit (HOSPITAL_COMMUNITY): Payer: Medicare Other | Admitting: Hematology

## 2020-08-17 ENCOUNTER — Ambulatory Visit (HOSPITAL_COMMUNITY): Payer: Medicare Other

## 2020-08-17 ENCOUNTER — Other Ambulatory Visit (HOSPITAL_COMMUNITY): Payer: Medicare Other

## 2020-08-17 NOTE — Telephone Encounter (Signed)
Pts drt called saying pt had been moved to [facility] in [place]. She said she needed a drs order for cath to be changed monthly there. Tasked Dr. Diona Fanti. He gave permission. Order faxed.

## 2020-08-19 ENCOUNTER — Telehealth (HOSPITAL_COMMUNITY): Payer: Self-pay | Admitting: Nurse Practitioner

## 2020-08-19 NOTE — Telephone Encounter (Signed)
Received an EKG from  [facility] to  f/u last increase of BB here on last visit, from toprol 50 mg daily to 75 mg daily, to keep pt form being transported here as it is so hard on the pt. Marland KitchenHe remains with a rapid ventricular rate at 126 bpm. In the hospital, in September, he ran 90's to 120's. It does appear that increase of rate control  will not  control ventricular rates. I feel this is multifactorial 2/2 to his advanced CA dx, failure to thrive, with last CA note mentioning  that pt is not eating or drinking and received IV fluids for dehydration and  increase in creatinine to 3.82 . Therefore will leave his BB dose at 75 mg daily.

## 2020-08-20 ENCOUNTER — Other Ambulatory Visit: Payer: Medicare Other | Admitting: Nurse Practitioner

## 2020-08-20 ENCOUNTER — Other Ambulatory Visit: Payer: Self-pay

## 2020-08-24 ENCOUNTER — Other Ambulatory Visit (HOSPITAL_COMMUNITY): Payer: Medicare Other

## 2020-08-24 ENCOUNTER — Ambulatory Visit (HOSPITAL_COMMUNITY): Payer: Medicare Other

## 2020-08-24 ENCOUNTER — Ambulatory Visit (HOSPITAL_COMMUNITY): Payer: Medicare Other | Admitting: Hematology

## 2020-08-25 ENCOUNTER — Ambulatory Visit: Payer: Medicare Other

## 2020-08-31 ENCOUNTER — Ambulatory Visit (HOSPITAL_COMMUNITY): Payer: Medicare Other | Admitting: Hematology

## 2020-08-31 ENCOUNTER — Ambulatory Visit (HOSPITAL_COMMUNITY): Payer: Medicare Other

## 2020-08-31 ENCOUNTER — Other Ambulatory Visit (HOSPITAL_COMMUNITY): Payer: Medicare Other

## 2020-09-06 DEATH — deceased

## 2020-09-25 IMAGING — PT NM PET TUM IMG RESTAG (PS) SKULL BASE T - THIGH
3 series · 22 of 25 positions shown · non-contrast
Comparison: PET-CT scan 03/01/2020

CLINICAL DATA: Subsequent treatment strategy for B-cell lymphoma.

EXAM:
NUCLEAR MEDICINE PET SKULL BASE TO THIGH
TECHNIQUE: 10.4 mCi F-18 FDG was injected intravenously. Full-ring PET imaging
was performed from the skull base to thigh after the radiotracer. CT
data was obtained and used for attenuation correction and anatomic
localization.
Fasting blood glucose: 114 mg/dl

[axial ct wb fusion · 16 of 193 slices shown]
[im 1/193]
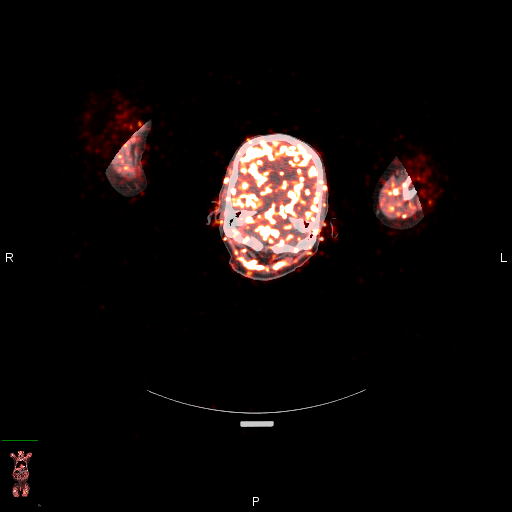
[im 12/193]
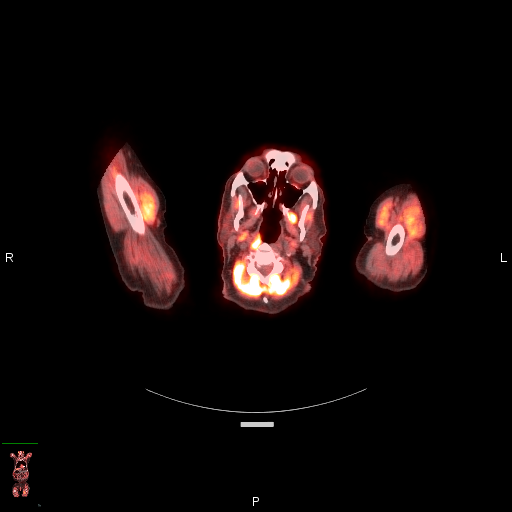
[im 23/193]
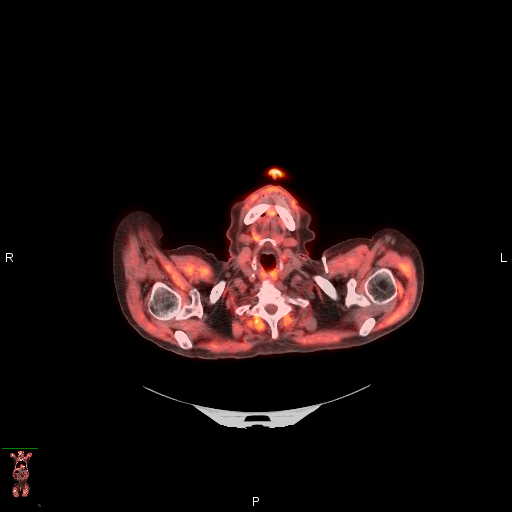
[im 34/193]
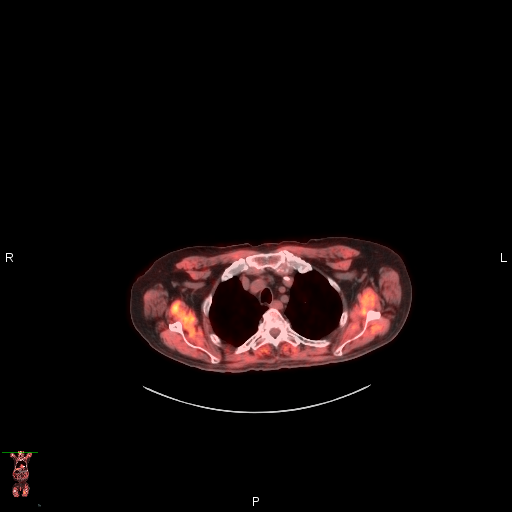
[im 57/193]
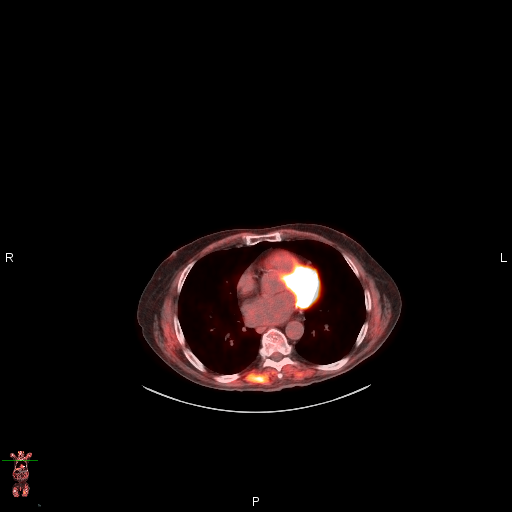
[im 68/193]
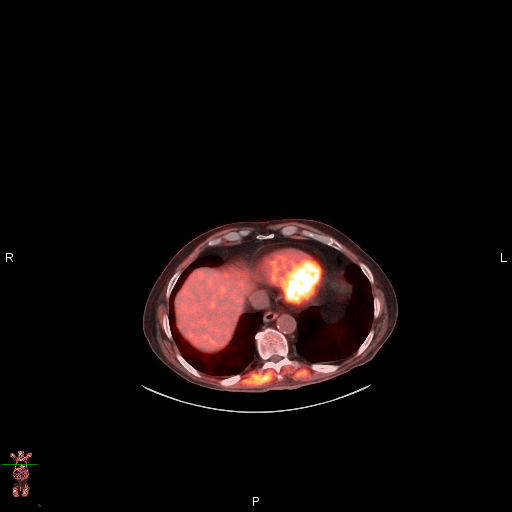
[im 80/193]
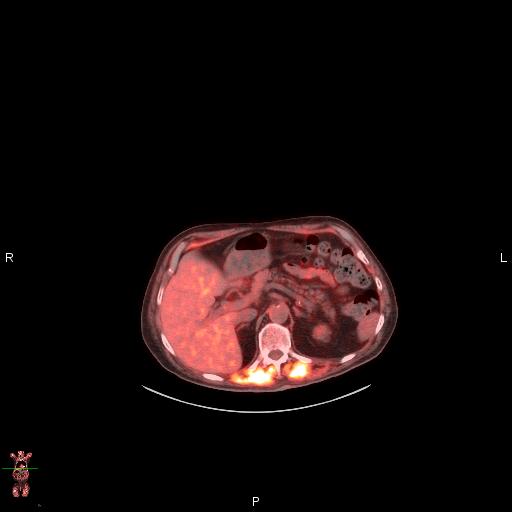
[im 91/193]
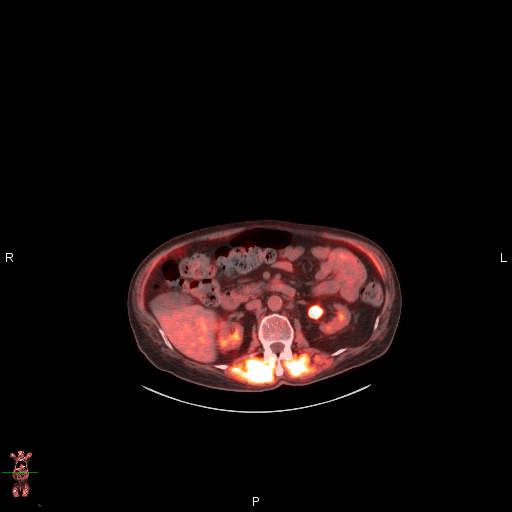
[im 102/193]
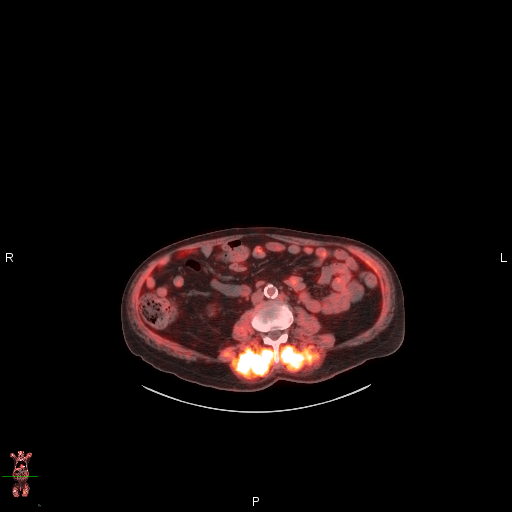
[im 113/193]
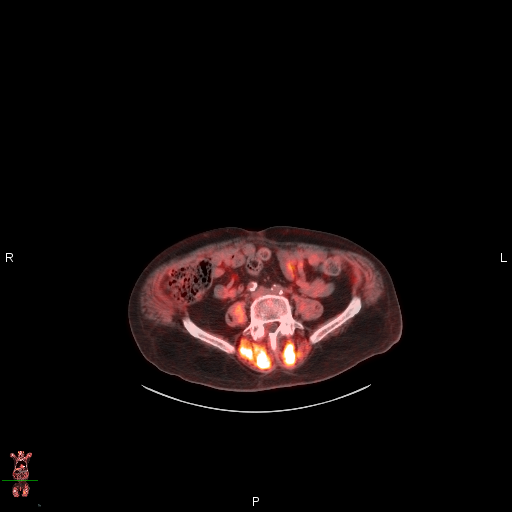
[im 125/193]
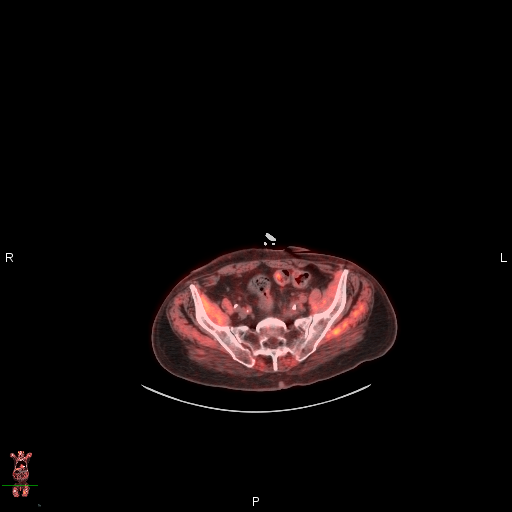
[im 147/193]
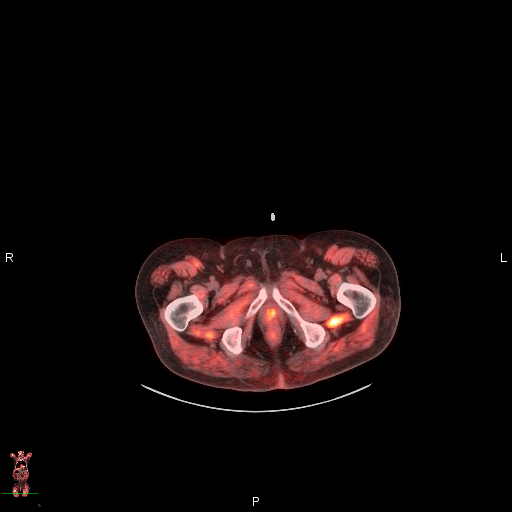
[im 159/193]
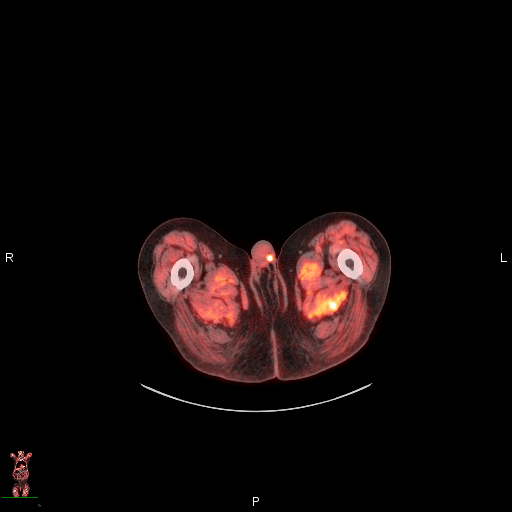
[im 170/193]
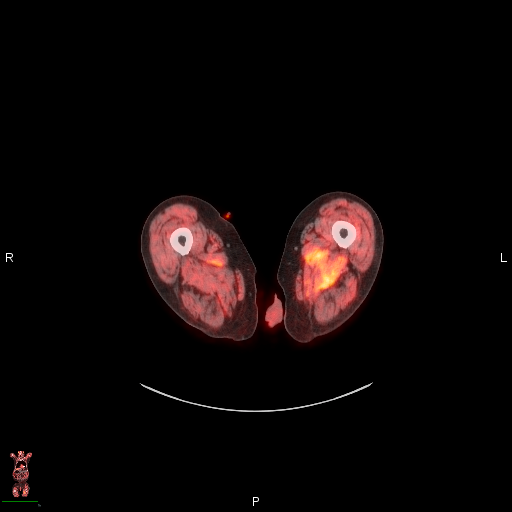
[im 181/193]
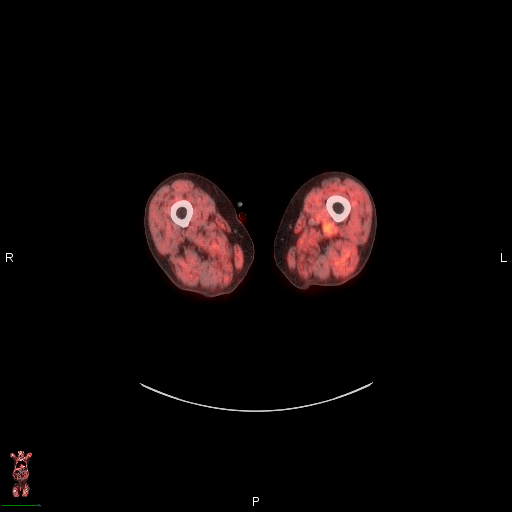
[im 193/193]
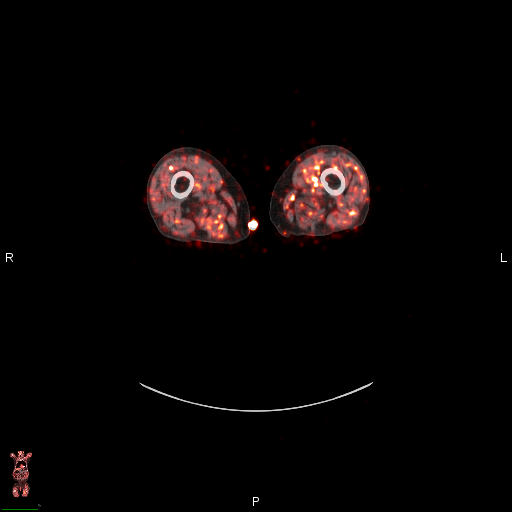

[mip · 4 of 48 slices shown]
[im 1/48]
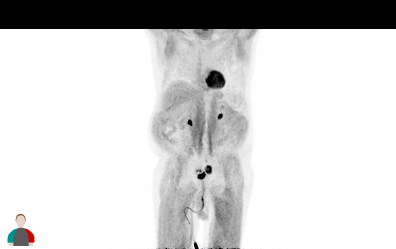
[im 12/48]
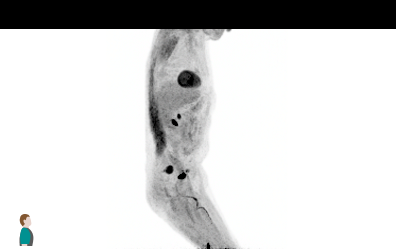
[im 36/48]
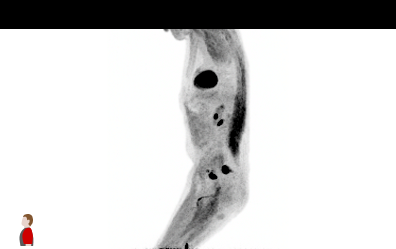
[im 48/48]
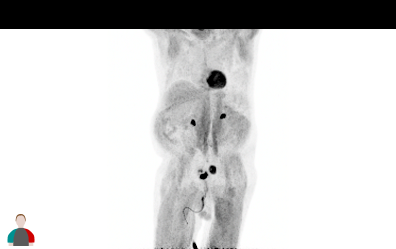

[coronal ct wb fusion · 2 of 22 slices shown]
[im 1/22]
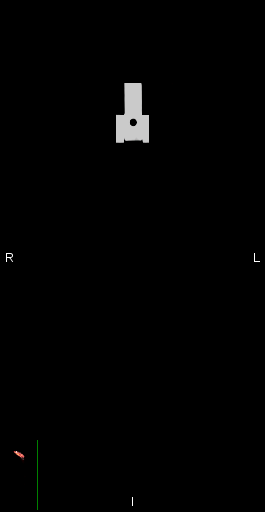
[im 22/22]
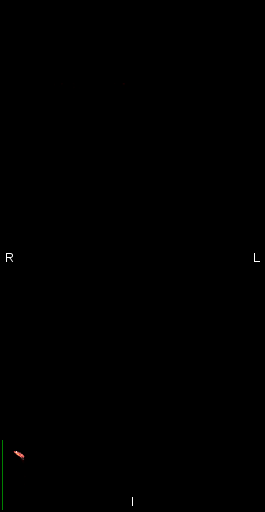

[22 of 25 positions shown; findings below may reference images not displayed]

FINDINGS: Mediastinal blood pool activity: SUV max

Liver activity: SUV max

NECK: No hypermetabolic lymph nodes in the neck.

Incidental CT findings: none

CHEST: No hypermetabolic mediastinal or hilar nodes. No suspicious
pulmonary nodules on the CT scan.

Incidental CT findings: Port in the anterior chest wall with tip in
distal SVC.

ABDOMEN/PELVIS: Interval enlargement hypermetabolic nodule in the
deep LEFT pelvis measuring 27 mm on image 266. This nodule is
intensely hypermetabolic with SUV max equal 10.4 and is increased in
size and metabolic activity from comparison PET-CT scan (previously
measuring 9 mm on 03/01/2020 with SUV max equal 4.5).

No additional hypermetabolic nodules or lymph nodes in the abdomen
pelvis. No abnormal activity liver. The spleen is normal size and
normal volume.

Incidental CT findings: Foley catheter in bladder

SKELETON: No focal hypermetabolic activity to suggest skeletal
metastasis.

Incidental CT findings: none
IMPRESSION: Enlarging intensely hypermetabolic nodule in the deep LEFT pelvis is
most concerning for lymphoma recurrence ( [HOSPITAL] 5).

No additional evidence of adenopathy on whole-body PET scan. Normal
spleen and bone marrow.

## 2021-01-11 ENCOUNTER — Ambulatory Visit: Payer: Medicare Other | Admitting: Urology
